# Patient Record
Sex: Female | Born: 1940 | Race: White | Hispanic: No | Marital: Married | Smoking: Former smoker
Health system: Southern US, Community
[De-identification: ages and names within clinical notes are randomized; demographics above are authoritative.]

## PROBLEM LIST (undated history)

## (undated) DIAGNOSIS — T50905A Adverse effect of unspecified drugs, medicaments and biological substances, initial encounter: Secondary | ICD-10-CM

## (undated) DIAGNOSIS — E039 Hypothyroidism, unspecified: Secondary | ICD-10-CM

## (undated) DIAGNOSIS — Z923 Personal history of irradiation: Secondary | ICD-10-CM

## (undated) DIAGNOSIS — Z9289 Personal history of other medical treatment: Secondary | ICD-10-CM

## (undated) DIAGNOSIS — N182 Chronic kidney disease, stage 2 (mild): Secondary | ICD-10-CM

## (undated) DIAGNOSIS — F419 Anxiety disorder, unspecified: Secondary | ICD-10-CM

## (undated) DIAGNOSIS — K5900 Constipation, unspecified: Secondary | ICD-10-CM

## (undated) DIAGNOSIS — M719 Bursopathy, unspecified: Secondary | ICD-10-CM

## (undated) DIAGNOSIS — D649 Anemia, unspecified: Secondary | ICD-10-CM

## (undated) DIAGNOSIS — I6529 Occlusion and stenosis of unspecified carotid artery: Secondary | ICD-10-CM

## (undated) DIAGNOSIS — M1990 Unspecified osteoarthritis, unspecified site: Secondary | ICD-10-CM

## (undated) DIAGNOSIS — K219 Gastro-esophageal reflux disease without esophagitis: Secondary | ICD-10-CM

## (undated) DIAGNOSIS — I38 Endocarditis, valve unspecified: Secondary | ICD-10-CM

## (undated) DIAGNOSIS — C50919 Malignant neoplasm of unspecified site of unspecified female breast: Secondary | ICD-10-CM

## (undated) DIAGNOSIS — I2510 Atherosclerotic heart disease of native coronary artery without angina pectoris: Secondary | ICD-10-CM

## (undated) DIAGNOSIS — E119 Type 2 diabetes mellitus without complications: Secondary | ICD-10-CM

## (undated) DIAGNOSIS — G4733 Obstructive sleep apnea (adult) (pediatric): Secondary | ICD-10-CM

## (undated) DIAGNOSIS — E785 Hyperlipidemia, unspecified: Secondary | ICD-10-CM

## (undated) DIAGNOSIS — R197 Diarrhea, unspecified: Secondary | ICD-10-CM

## (undated) DIAGNOSIS — J189 Pneumonia, unspecified organism: Secondary | ICD-10-CM

## (undated) DIAGNOSIS — F329 Major depressive disorder, single episode, unspecified: Secondary | ICD-10-CM

## (undated) DIAGNOSIS — A499 Bacterial infection, unspecified: Secondary | ICD-10-CM

## (undated) DIAGNOSIS — N390 Urinary tract infection, site not specified: Secondary | ICD-10-CM

## (undated) DIAGNOSIS — M359 Systemic involvement of connective tissue, unspecified: Secondary | ICD-10-CM

## (undated) DIAGNOSIS — I10 Essential (primary) hypertension: Secondary | ICD-10-CM

## (undated) DIAGNOSIS — F32A Depression, unspecified: Secondary | ICD-10-CM

## (undated) HISTORY — DX: Essential (primary) hypertension: I10

## (undated) HISTORY — DX: Atherosclerotic heart disease of native coronary artery without angina pectoris: I25.10

## (undated) HISTORY — DX: Major depressive disorder, single episode, unspecified: F32.9

## (undated) HISTORY — DX: Urinary tract infection, site not specified: A49.9

## (undated) HISTORY — DX: Constipation, unspecified: K59.00

## (undated) HISTORY — PX: INCISIONAL BREAST BIOPSY: SHX1812

## (undated) HISTORY — DX: Adverse effect of unspecified drugs, medicaments and biological substances, initial encounter: T50.905A

## (undated) HISTORY — DX: Hyperlipidemia, unspecified: E78.5

## (undated) HISTORY — DX: Unspecified osteoarthritis, unspecified site: M19.90

## (undated) HISTORY — PX: OTHER SURGICAL HISTORY: SHX169

## (undated) HISTORY — DX: Hypocalcemia: E83.51

## (undated) HISTORY — DX: Pneumonia, unspecified organism: J18.9

## (undated) HISTORY — PX: KNEE SURGERY: SHX244

## (undated) HISTORY — DX: Obstructive sleep apnea (adult) (pediatric): G47.33

## (undated) HISTORY — DX: Type 2 diabetes mellitus without complications: E11.9

## (undated) HISTORY — DX: Malignant neoplasm of unspecified site of unspecified female breast: C50.919

## (undated) HISTORY — DX: Anxiety disorder, unspecified: F41.9

## (undated) HISTORY — DX: Personal history of other medical treatment: Z92.89

## (undated) HISTORY — DX: Depression, unspecified: F32.A

## (undated) HISTORY — DX: Chronic kidney disease, stage 2 (mild): N18.2

## (undated) HISTORY — PX: PORT-A-CATH REMOVAL: SHX5289

## (undated) HISTORY — DX: Endocarditis, valve unspecified: I38

## (undated) HISTORY — DX: Anemia, unspecified: D64.9

## (undated) HISTORY — DX: Diarrhea, unspecified: R19.7

## (undated) HISTORY — DX: Urinary tract infection, site not specified: N39.0

## (undated) HISTORY — PX: CAROTID STENT: SHX1301

## (undated) HISTORY — DX: Occlusion and stenosis of unspecified carotid artery: I65.29

## (undated) HISTORY — DX: Hypothyroidism, unspecified: E03.9

## (undated) HISTORY — DX: Gastro-esophageal reflux disease without esophagitis: K21.9

---

## 1997-05-12 ENCOUNTER — Ambulatory Visit (HOSPITAL_COMMUNITY): Admission: RE | Admit: 1997-05-12 | Discharge: 1997-05-12 | Payer: Self-pay | Admitting: Family Medicine

## 1998-08-19 ENCOUNTER — Encounter: Payer: Self-pay | Admitting: Emergency Medicine

## 1998-08-20 ENCOUNTER — Encounter: Payer: Self-pay | Admitting: Family Medicine

## 1998-08-20 ENCOUNTER — Inpatient Hospital Stay (HOSPITAL_COMMUNITY): Admission: EM | Admit: 1998-08-20 | Discharge: 1998-09-01 | Payer: Self-pay | Admitting: Emergency Medicine

## 1998-08-23 ENCOUNTER — Encounter: Payer: Self-pay | Admitting: Family Medicine

## 1998-08-24 ENCOUNTER — Encounter: Payer: Self-pay | Admitting: Cardiothoracic Surgery

## 1998-08-25 ENCOUNTER — Encounter: Payer: Self-pay | Admitting: Cardiothoracic Surgery

## 1998-08-26 ENCOUNTER — Encounter: Payer: Self-pay | Admitting: Cardiothoracic Surgery

## 1998-08-27 ENCOUNTER — Encounter: Payer: Self-pay | Admitting: Cardiothoracic Surgery

## 1998-08-28 ENCOUNTER — Encounter: Payer: Self-pay | Admitting: Cardiothoracic Surgery

## 1998-08-29 ENCOUNTER — Encounter: Payer: Self-pay | Admitting: Surgery

## 1998-08-31 ENCOUNTER — Encounter: Payer: Self-pay | Admitting: Surgery

## 1998-09-02 ENCOUNTER — Encounter: Admission: RE | Admit: 1998-09-02 | Discharge: 1998-09-02 | Payer: Self-pay | Admitting: Family Medicine

## 1998-12-31 ENCOUNTER — Encounter: Payer: Self-pay | Admitting: Cardiothoracic Surgery

## 1998-12-31 ENCOUNTER — Encounter: Admission: RE | Admit: 1998-12-31 | Discharge: 1998-12-31 | Payer: Self-pay | Admitting: Cardiothoracic Surgery

## 2009-01-30 HISTORY — PX: CAROTID ENDARTERECTOMY: SUR193

## 2009-05-14 ENCOUNTER — Ambulatory Visit: Payer: Self-pay | Admitting: Vascular Surgery

## 2009-05-14 ENCOUNTER — Encounter: Payer: Self-pay | Admitting: Cardiology

## 2009-05-17 ENCOUNTER — Encounter: Payer: Self-pay | Admitting: Cardiology

## 2009-05-17 ENCOUNTER — Ambulatory Visit: Payer: Self-pay | Admitting: Surgery

## 2009-05-19 ENCOUNTER — Encounter: Admission: RE | Admit: 2009-05-19 | Discharge: 2009-05-19 | Payer: Self-pay | Admitting: Cardiology

## 2009-05-20 ENCOUNTER — Ambulatory Visit (HOSPITAL_COMMUNITY): Admission: RE | Admit: 2009-05-20 | Discharge: 2009-05-20 | Payer: Self-pay | Admitting: Cardiology

## 2009-05-28 ENCOUNTER — Encounter: Payer: Self-pay | Admitting: Surgery

## 2009-05-28 ENCOUNTER — Inpatient Hospital Stay (HOSPITAL_COMMUNITY): Admission: RE | Admit: 2009-05-28 | Discharge: 2009-05-30 | Payer: Self-pay | Admitting: Surgery

## 2009-05-28 ENCOUNTER — Ambulatory Visit: Payer: Self-pay | Admitting: Surgery

## 2009-05-30 DIAGNOSIS — C50919 Malignant neoplasm of unspecified site of unspecified female breast: Secondary | ICD-10-CM

## 2009-05-30 HISTORY — DX: Malignant neoplasm of unspecified site of unspecified female breast: C50.919

## 2009-06-01 ENCOUNTER — Telehealth: Payer: Self-pay | Admitting: Internal Medicine

## 2009-06-07 ENCOUNTER — Encounter: Admission: RE | Admit: 2009-06-07 | Discharge: 2009-06-07 | Payer: Self-pay | Admitting: Surgery

## 2009-06-07 ENCOUNTER — Ambulatory Visit: Payer: Self-pay | Admitting: Surgery

## 2009-06-07 ENCOUNTER — Encounter: Payer: Self-pay | Admitting: Internal Medicine

## 2009-06-09 ENCOUNTER — Telehealth: Payer: Self-pay | Admitting: Internal Medicine

## 2009-06-10 ENCOUNTER — Ambulatory Visit: Payer: Self-pay | Admitting: Diagnostic Radiology

## 2009-06-10 ENCOUNTER — Ambulatory Visit: Payer: Self-pay | Admitting: Internal Medicine

## 2009-06-10 ENCOUNTER — Ambulatory Visit (HOSPITAL_BASED_OUTPATIENT_CLINIC_OR_DEPARTMENT_OTHER): Admission: RE | Admit: 2009-06-10 | Discharge: 2009-06-10 | Payer: Self-pay | Admitting: Internal Medicine

## 2009-06-10 DIAGNOSIS — E039 Hypothyroidism, unspecified: Secondary | ICD-10-CM | POA: Insufficient documentation

## 2009-06-10 DIAGNOSIS — E782 Mixed hyperlipidemia: Secondary | ICD-10-CM

## 2009-06-10 DIAGNOSIS — K219 Gastro-esophageal reflux disease without esophagitis: Secondary | ICD-10-CM

## 2009-06-10 DIAGNOSIS — M1990 Unspecified osteoarthritis, unspecified site: Secondary | ICD-10-CM | POA: Insufficient documentation

## 2009-06-10 DIAGNOSIS — R930 Abnormal findings on diagnostic imaging of skull and head, not elsewhere classified: Secondary | ICD-10-CM | POA: Insufficient documentation

## 2009-06-10 DIAGNOSIS — R9389 Abnormal findings on diagnostic imaging of other specified body structures: Secondary | ICD-10-CM

## 2009-06-10 DIAGNOSIS — I10 Essential (primary) hypertension: Secondary | ICD-10-CM | POA: Insufficient documentation

## 2009-06-10 DIAGNOSIS — F329 Major depressive disorder, single episode, unspecified: Secondary | ICD-10-CM | POA: Insufficient documentation

## 2009-06-10 DIAGNOSIS — C50919 Malignant neoplasm of unspecified site of unspecified female breast: Secondary | ICD-10-CM | POA: Insufficient documentation

## 2009-06-10 LAB — CONVERTED CEMR LAB
Alpha 2, Urine: DETECTED % — AB
BUN: 38 mg/dL — ABNORMAL HIGH (ref 6–23)
Beta Globulin: 6.2 % (ref 4.7–7.2)
Beta, Urine: DETECTED % — AB
Chloride: 98 meq/L (ref 96–112)
Ferritin: 797 ng/mL — ABNORMAL HIGH (ref 10–291)
Free Kappa Lt Chains,Ur: 5.79 mg/dL — ABNORMAL HIGH (ref 0.04–1.51)
Free Lambda Lt Chains,Ur: 0.35 mg/dL (ref 0.08–1.01)
Gamma Globulin: 15.7 % (ref 11.1–18.8)
IgA: 262 mg/dL (ref 68–378)
IgG (Immunoglobin G), Serum: 1190 mg/dL (ref 694–1618)
Iron: 46 ug/dL (ref 42–145)
Potassium: 4.8 meq/L (ref 3.5–5.3)
Saturation Ratios: 19 % — ABNORMAL LOW (ref 20–55)
TIBC: 241 ug/dL — ABNORMAL LOW (ref 250–470)
TSH: 0.077 microintl units/mL — ABNORMAL LOW (ref 0.350–4.500)
UIBC: 195 ug/dL
Vitamin B-12: 195 pg/mL — ABNORMAL LOW (ref 211–911)

## 2009-06-11 ENCOUNTER — Encounter (INDEPENDENT_AMBULATORY_CARE_PROVIDER_SITE_OTHER): Payer: Self-pay | Admitting: *Deleted

## 2009-06-14 ENCOUNTER — Encounter: Admission: RE | Admit: 2009-06-14 | Discharge: 2009-06-14 | Payer: Self-pay | Admitting: Internal Medicine

## 2009-06-14 ENCOUNTER — Telehealth: Payer: Self-pay | Admitting: Internal Medicine

## 2009-06-14 ENCOUNTER — Encounter: Payer: Self-pay | Admitting: Internal Medicine

## 2009-06-17 ENCOUNTER — Encounter: Payer: Self-pay | Admitting: Internal Medicine

## 2009-06-17 ENCOUNTER — Encounter: Admission: RE | Admit: 2009-06-17 | Discharge: 2009-06-17 | Payer: Self-pay | Admitting: Internal Medicine

## 2009-06-17 LAB — HM MAMMOGRAPHY: HM Mammogram: ABNORMAL

## 2009-06-21 ENCOUNTER — Ambulatory Visit: Payer: Self-pay | Admitting: Surgery

## 2009-06-21 ENCOUNTER — Encounter: Payer: Self-pay | Admitting: Internal Medicine

## 2009-06-22 ENCOUNTER — Ambulatory Visit: Payer: Self-pay | Admitting: Oncology

## 2009-06-22 ENCOUNTER — Ambulatory Visit: Payer: Self-pay | Admitting: Internal Medicine

## 2009-06-22 DIAGNOSIS — C50919 Malignant neoplasm of unspecified site of unspecified female breast: Secondary | ICD-10-CM | POA: Insufficient documentation

## 2009-06-23 ENCOUNTER — Encounter: Payer: Self-pay | Admitting: Internal Medicine

## 2009-06-23 LAB — CBC WITH DIFFERENTIAL/PLATELET
Basophils Absolute: 0 10*3/uL (ref 0.0–0.1)
EOS%: 2.7 % (ref 0.0–7.0)
HGB: 8.6 g/dL — ABNORMAL LOW (ref 11.6–15.9)
MCH: 29.3 pg (ref 25.1–34.0)
MONO%: 6.7 % (ref 0.0–14.0)
NEUT#: 3.2 10*3/uL (ref 1.5–6.5)
RBC: 2.94 10*6/uL — ABNORMAL LOW (ref 3.70–5.45)
RDW: 16.7 % — ABNORMAL HIGH (ref 11.2–14.5)
lymph#: 3.2 10*3/uL (ref 0.9–3.3)

## 2009-06-23 LAB — COMPREHENSIVE METABOLIC PANEL
ALT: 8 U/L (ref 0–35)
AST: 14 U/L (ref 0–37)
Albumin: 4 g/dL (ref 3.5–5.2)
Alkaline Phosphatase: 198 U/L — ABNORMAL HIGH (ref 39–117)
BUN: 33 mg/dL — ABNORMAL HIGH (ref 6–23)
Calcium: 8.6 mg/dL (ref 8.4–10.5)
Chloride: 103 mEq/L (ref 96–112)
Potassium: 4.8 mEq/L (ref 3.5–5.3)
Sodium: 137 mEq/L (ref 135–145)
Total Protein: 6.5 g/dL (ref 6.0–8.3)

## 2009-06-24 ENCOUNTER — Encounter: Admission: RE | Admit: 2009-06-24 | Discharge: 2009-06-24 | Payer: Self-pay | Admitting: Internal Medicine

## 2009-06-25 ENCOUNTER — Ambulatory Visit (HOSPITAL_COMMUNITY): Admission: RE | Admit: 2009-06-25 | Discharge: 2009-06-25 | Payer: Self-pay | Admitting: Internal Medicine

## 2009-06-29 ENCOUNTER — Encounter: Admission: RE | Admit: 2009-06-29 | Discharge: 2009-06-29 | Payer: Self-pay | Admitting: Internal Medicine

## 2009-06-30 ENCOUNTER — Telehealth: Payer: Self-pay | Admitting: Internal Medicine

## 2009-07-01 ENCOUNTER — Ambulatory Visit (HOSPITAL_COMMUNITY): Admission: RE | Admit: 2009-07-01 | Discharge: 2009-07-01 | Payer: Self-pay | Admitting: Oncology

## 2009-07-01 ENCOUNTER — Encounter: Payer: Self-pay | Admitting: Internal Medicine

## 2009-07-06 ENCOUNTER — Ambulatory Visit (HOSPITAL_COMMUNITY): Admission: RE | Admit: 2009-07-06 | Discharge: 2009-07-06 | Payer: Self-pay | Admitting: Oncology

## 2009-07-16 ENCOUNTER — Telehealth: Payer: Self-pay | Admitting: Internal Medicine

## 2009-07-19 ENCOUNTER — Encounter: Payer: Self-pay | Admitting: Internal Medicine

## 2009-07-22 ENCOUNTER — Ambulatory Visit (HOSPITAL_COMMUNITY): Admission: RE | Admit: 2009-07-22 | Discharge: 2009-07-22 | Payer: Self-pay | Admitting: General Surgery

## 2009-07-26 ENCOUNTER — Ambulatory Visit: Payer: Self-pay | Admitting: Oncology

## 2009-07-27 LAB — CBC & DIFF AND RETIC
BASO%: 0.4 % (ref 0.0–2.0)
Basophils Absolute: 0 10*3/uL (ref 0.0–0.1)
HCT: 25.8 % — ABNORMAL LOW (ref 34.8–46.6)
HGB: 7.7 g/dL — ABNORMAL LOW (ref 11.6–15.9)
Immature Retic Fract: 12.5 % — ABNORMAL HIGH (ref 0.00–10.70)
MONO#: 0.6 10*3/uL (ref 0.1–0.9)
NEUT%: 45.9 % (ref 38.4–76.8)
RDW: 19.6 % — ABNORMAL HIGH (ref 11.2–14.5)
Retic %: 3.08 % — ABNORMAL HIGH (ref 0.50–1.50)
Retic Ct Abs: 83.78 10*3/uL — ABNORMAL HIGH (ref 18.30–72.70)
WBC: 6.9 10*3/uL (ref 3.9–10.3)
lymph#: 2.9 10*3/uL (ref 0.9–3.3)

## 2009-07-28 LAB — COMPREHENSIVE METABOLIC PANEL
ALT: 9 U/L (ref 0–35)
AST: 10 U/L (ref 0–37)
Alkaline Phosphatase: 229 U/L — ABNORMAL HIGH (ref 39–117)
CO2: 19 mEq/L (ref 19–32)
Sodium: 138 mEq/L (ref 135–145)
Total Bilirubin: 0.3 mg/dL (ref 0.3–1.2)
Total Protein: 6.1 g/dL (ref 6.0–8.3)

## 2009-07-28 LAB — VITAMIN D 25 HYDROXY (VIT D DEFICIENCY, FRACTURES): Vit D, 25-Hydroxy: 11 ng/mL — ABNORMAL LOW (ref 30–89)

## 2009-08-06 ENCOUNTER — Encounter: Payer: Self-pay | Admitting: Internal Medicine

## 2009-08-16 ENCOUNTER — Encounter: Payer: Self-pay | Admitting: Internal Medicine

## 2009-08-16 LAB — CONVERTED CEMR LAB
BUN: 28 mg/dL — ABNORMAL HIGH (ref 6–23)
Chloride: 109 meq/L (ref 96–112)
Creatinine, Ser: 0.95 mg/dL (ref 0.40–1.20)
Hgb A1c MFr Bld: 5.9 % — ABNORMAL HIGH (ref <5.7)
Potassium: 5.8 meq/L — ABNORMAL HIGH (ref 3.5–5.3)
TSH: 0.072 microintl units/mL — ABNORMAL LOW (ref 0.350–4.500)

## 2009-08-17 ENCOUNTER — Telehealth: Payer: Self-pay | Admitting: Internal Medicine

## 2009-08-17 ENCOUNTER — Encounter: Admission: RE | Admit: 2009-08-17 | Discharge: 2009-08-17 | Payer: Self-pay | Admitting: Oncology

## 2009-08-19 ENCOUNTER — Encounter: Payer: Self-pay | Admitting: Internal Medicine

## 2009-08-23 ENCOUNTER — Ambulatory Visit: Payer: Self-pay | Admitting: Internal Medicine

## 2009-08-23 DIAGNOSIS — E875 Hyperkalemia: Secondary | ICD-10-CM | POA: Insufficient documentation

## 2009-08-23 LAB — CONVERTED CEMR LAB
Calcium: 9.5 mg/dL (ref 8.4–10.5)
Creatinine, Ser: 0.87 mg/dL (ref 0.40–1.20)
Sodium: 139 meq/L (ref 135–145)

## 2009-08-24 ENCOUNTER — Telehealth: Payer: Self-pay | Admitting: Internal Medicine

## 2009-08-24 ENCOUNTER — Encounter: Payer: Self-pay | Admitting: Internal Medicine

## 2009-08-24 LAB — CBC WITH DIFFERENTIAL/PLATELET
BASO%: 0.5 % (ref 0.0–2.0)
EOS%: 3.2 % (ref 0.0–7.0)
LYMPH%: 41.7 % (ref 14.0–49.7)
MCHC: 30.6 g/dL — ABNORMAL LOW (ref 31.5–36.0)
MONO#: 0.4 10*3/uL (ref 0.1–0.9)
MONO%: 6.8 % (ref 0.0–14.0)
Platelets: 272 10*3/uL (ref 145–400)
RBC: 2.87 10*6/uL — ABNORMAL LOW (ref 3.70–5.45)
WBC: 6.3 10*3/uL (ref 3.9–10.3)

## 2009-08-24 LAB — COMPREHENSIVE METABOLIC PANEL
Albumin: 3.9 g/dL (ref 3.5–5.2)
Alkaline Phosphatase: 196 U/L — ABNORMAL HIGH (ref 39–117)
CO2: 18 mEq/L — ABNORMAL LOW (ref 19–32)
Glucose, Bld: 80 mg/dL (ref 70–99)
Potassium: 6 mEq/L — ABNORMAL HIGH (ref 3.5–5.3)
Sodium: 138 mEq/L (ref 135–145)
Total Protein: 6.4 g/dL (ref 6.0–8.3)

## 2009-09-13 ENCOUNTER — Telehealth: Payer: Self-pay | Admitting: Internal Medicine

## 2009-09-17 ENCOUNTER — Ambulatory Visit: Payer: Self-pay | Admitting: Oncology

## 2009-09-21 LAB — COMPREHENSIVE METABOLIC PANEL
Albumin: 4 g/dL (ref 3.5–5.2)
Alkaline Phosphatase: 141 U/L — ABNORMAL HIGH (ref 39–117)
BUN: 24 mg/dL — ABNORMAL HIGH (ref 6–23)
Creatinine, Ser: 0.85 mg/dL (ref 0.40–1.20)
Glucose, Bld: 96 mg/dL (ref 70–99)
Total Bilirubin: 0.3 mg/dL (ref 0.3–1.2)

## 2009-09-21 LAB — CBC WITH DIFFERENTIAL/PLATELET
Basophils Absolute: 0 10*3/uL (ref 0.0–0.1)
Eosinophils Absolute: 0.3 10*3/uL (ref 0.0–0.5)
HGB: 9.5 g/dL — ABNORMAL LOW (ref 11.6–15.9)
LYMPH%: 35.1 % (ref 14.0–49.7)
MCV: 94.8 fL (ref 79.5–101.0)
MONO%: 10 % (ref 0.0–14.0)
NEUT#: 3.8 10*3/uL (ref 1.5–6.5)
NEUT%: 50.9 % (ref 38.4–76.8)
Platelets: 275 10*3/uL (ref 145–400)

## 2009-09-29 ENCOUNTER — Encounter: Payer: Self-pay | Admitting: Internal Medicine

## 2009-10-19 ENCOUNTER — Encounter: Payer: Self-pay | Admitting: Internal Medicine

## 2009-10-19 ENCOUNTER — Ambulatory Visit: Payer: Self-pay | Admitting: Oncology

## 2009-10-19 LAB — CBC WITH DIFFERENTIAL/PLATELET
BASO%: 0.4 % (ref 0.0–2.0)
Basophils Absolute: 0 10*3/uL (ref 0.0–0.1)
EOS%: 4.1 % (ref 0.0–7.0)
HGB: 10.5 g/dL — ABNORMAL LOW (ref 11.6–15.9)
MCH: 28.5 pg (ref 25.1–34.0)
MCHC: 30.6 g/dL — ABNORMAL LOW (ref 31.5–36.0)
MCV: 93 fL (ref 79.5–101.0)
MONO%: 7.1 % (ref 0.0–14.0)
RDW: 14.2 % (ref 11.2–14.5)

## 2009-10-19 LAB — COMPREHENSIVE METABOLIC PANEL
AST: 14 U/L (ref 0–37)
Alkaline Phosphatase: 104 U/L (ref 39–117)
BUN: 25 mg/dL — ABNORMAL HIGH (ref 6–23)
Creatinine, Ser: 1.03 mg/dL (ref 0.40–1.20)
Potassium: 5.6 mEq/L — ABNORMAL HIGH (ref 3.5–5.3)

## 2009-11-01 ENCOUNTER — Telehealth: Payer: Self-pay | Admitting: Internal Medicine

## 2009-11-16 LAB — COMPREHENSIVE METABOLIC PANEL
AST: 13 U/L (ref 0–37)
Albumin: 4 g/dL (ref 3.5–5.2)
BUN: 26 mg/dL — ABNORMAL HIGH (ref 6–23)
CO2: 25 mEq/L (ref 19–32)
Calcium: 9.1 mg/dL (ref 8.4–10.5)
Chloride: 107 mEq/L (ref 96–112)
Glucose, Bld: 103 mg/dL — ABNORMAL HIGH (ref 70–99)
Potassium: 5.6 mEq/L — ABNORMAL HIGH (ref 3.5–5.3)

## 2009-11-16 LAB — CBC WITH DIFFERENTIAL/PLATELET
Basophils Absolute: 0 10*3/uL (ref 0.0–0.1)
EOS%: 4 % (ref 0.0–7.0)
Eosinophils Absolute: 0.3 10*3/uL (ref 0.0–0.5)
HCT: 31.9 % — ABNORMAL LOW (ref 34.8–46.6)
HGB: 10.8 g/dL — ABNORMAL LOW (ref 11.6–15.9)
MONO#: 0.5 10*3/uL (ref 0.1–0.9)
NEUT#: 3.8 10*3/uL (ref 1.5–6.5)
NEUT%: 55.8 % (ref 38.4–76.8)
RDW: 15.5 % — ABNORMAL HIGH (ref 11.2–14.5)
lymph#: 2.2 10*3/uL (ref 0.9–3.3)

## 2009-11-16 LAB — CANCER ANTIGEN 27.29: CA 27.29: 58 U/mL — ABNORMAL HIGH (ref 0–39)

## 2009-12-08 ENCOUNTER — Ambulatory Visit (HOSPITAL_COMMUNITY): Admission: RE | Admit: 2009-12-08 | Discharge: 2009-12-08 | Payer: Self-pay | Admitting: Family Medicine

## 2009-12-11 ENCOUNTER — Encounter: Admission: RE | Admit: 2009-12-11 | Discharge: 2009-12-11 | Payer: Self-pay | Admitting: Oncology

## 2009-12-13 ENCOUNTER — Ambulatory Visit: Payer: Self-pay | Admitting: Oncology

## 2009-12-15 ENCOUNTER — Encounter: Payer: Self-pay | Admitting: Internal Medicine

## 2009-12-15 LAB — COMPREHENSIVE METABOLIC PANEL
Albumin: 3.8 g/dL (ref 3.5–5.2)
CO2: 23 mEq/L (ref 19–32)
Glucose, Bld: 91 mg/dL (ref 70–99)
Potassium: 4.8 mEq/L (ref 3.5–5.3)
Sodium: 139 mEq/L (ref 135–145)
Total Protein: 7.1 g/dL (ref 6.0–8.3)

## 2009-12-15 LAB — CBC WITH DIFFERENTIAL/PLATELET
Eosinophils Absolute: 0.2 10*3/uL (ref 0.0–0.5)
LYMPH%: 38.1 % (ref 14.0–49.7)
MONO#: 0.6 10*3/uL (ref 0.1–0.9)
NEUT#: 3.4 10*3/uL (ref 1.5–6.5)
Platelets: 255 10*3/uL (ref 145–400)
RBC: 3.53 10*6/uL — ABNORMAL LOW (ref 3.70–5.45)
RDW: 15.8 % — ABNORMAL HIGH (ref 11.2–14.5)
WBC: 6.9 10*3/uL (ref 3.9–10.3)

## 2009-12-15 LAB — CANCER ANTIGEN 27.29: CA 27.29: 63 U/mL — ABNORMAL HIGH (ref 0–39)

## 2010-01-11 LAB — CBC WITH DIFFERENTIAL/PLATELET
BASO%: 0.7 % (ref 0.0–2.0)
EOS%: 3.8 % (ref 0.0–7.0)
HCT: 32.7 % — ABNORMAL LOW (ref 34.8–46.6)
LYMPH%: 37.5 % (ref 14.0–49.7)
MCH: 29.2 pg (ref 25.1–34.0)
MCHC: 32.1 g/dL (ref 31.5–36.0)
MCV: 90.8 fL (ref 79.5–101.0)
MONO#: 0.5 10*3/uL (ref 0.1–0.9)
MONO%: 6.1 % (ref 0.0–14.0)
NEUT%: 51.9 % (ref 38.4–76.8)
Platelets: 240 10*3/uL (ref 145–400)
RBC: 3.6 10*6/uL — ABNORMAL LOW (ref 3.70–5.45)
WBC: 7.7 10*3/uL (ref 3.9–10.3)
nRBC: 0 % (ref 0–0)

## 2010-01-11 LAB — COMPREHENSIVE METABOLIC PANEL
ALT: 22 U/L (ref 0–35)
AST: 18 U/L (ref 0–37)
BUN: 26 mg/dL — ABNORMAL HIGH (ref 6–23)
Calcium: 8.8 mg/dL (ref 8.4–10.5)
Chloride: 106 mEq/L (ref 96–112)
Creatinine, Ser: 1.11 mg/dL (ref 0.40–1.20)
Total Bilirubin: 0.3 mg/dL (ref 0.3–1.2)

## 2010-02-04 ENCOUNTER — Ambulatory Visit: Payer: Self-pay | Admitting: Oncology

## 2010-02-04 ENCOUNTER — Telehealth: Payer: Self-pay | Admitting: Internal Medicine

## 2010-02-07 ENCOUNTER — Telehealth: Payer: Self-pay | Admitting: Internal Medicine

## 2010-02-07 ENCOUNTER — Encounter: Payer: Self-pay | Admitting: Internal Medicine

## 2010-02-08 ENCOUNTER — Encounter: Payer: Self-pay | Admitting: Internal Medicine

## 2010-02-08 ENCOUNTER — Telehealth: Payer: Self-pay | Admitting: Internal Medicine

## 2010-02-08 LAB — COMPREHENSIVE METABOLIC PANEL WITH GFR
ALT: 11 U/L (ref 0–35)
AST: 10 U/L (ref 0–37)
Albumin: 4.2 g/dL (ref 3.5–5.2)
Alkaline Phosphatase: 79 U/L (ref 39–117)
BUN: 46 mg/dL — ABNORMAL HIGH (ref 6–23)
CO2: 21 meq/L (ref 19–32)
Calcium: 8.8 mg/dL (ref 8.4–10.5)
Chloride: 104 meq/L (ref 96–112)
Creatinine, Ser: 1.22 mg/dL — ABNORMAL HIGH (ref 0.40–1.20)
Glucose, Bld: 107 mg/dL — ABNORMAL HIGH (ref 70–99)
Potassium: 5 meq/L (ref 3.5–5.3)
Sodium: 136 meq/L (ref 135–145)
Total Bilirubin: 0.3 mg/dL (ref 0.3–1.2)
Total Protein: 6.9 g/dL (ref 6.0–8.3)

## 2010-02-08 LAB — CBC WITH DIFFERENTIAL/PLATELET
BASO%: 0.6 % (ref 0.0–2.0)
Basophils Absolute: 0 10e3/uL (ref 0.0–0.1)
EOS%: 4.7 % (ref 0.0–7.0)
Eosinophils Absolute: 0.3 10e3/uL (ref 0.0–0.5)
HCT: 31.3 % — ABNORMAL LOW (ref 34.8–46.6)
HGB: 10.1 g/dL — ABNORMAL LOW (ref 11.6–15.9)
LYMPH%: 34.9 % (ref 14.0–49.7)
MCH: 29.7 pg (ref 25.1–34.0)
MCHC: 32.3 g/dL (ref 31.5–36.0)
MCV: 92.1 fL (ref 79.5–101.0)
MONO#: 0.5 10e3/uL (ref 0.1–0.9)
MONO%: 7.5 % (ref 0.0–14.0)
NEUT#: 3.7 10e3/uL (ref 1.5–6.5)
NEUT%: 52.3 % (ref 38.4–76.8)
Platelets: 244 10e3/uL (ref 145–400)
RBC: 3.4 10e6/uL — ABNORMAL LOW (ref 3.70–5.45)
RDW: 14.7 % — ABNORMAL HIGH (ref 11.2–14.5)
WBC: 7.1 10e3/uL (ref 3.9–10.3)
lymph#: 2.5 10e3/uL (ref 0.9–3.3)
nRBC: 0 % (ref 0–0)

## 2010-02-08 LAB — CANCER ANTIGEN 27.29: CA 27.29: 78 U/mL — ABNORMAL HIGH (ref 0–39)

## 2010-02-14 ENCOUNTER — Ambulatory Visit: Admission: RE | Admit: 2010-02-14 | Payer: Self-pay | Source: Home / Self Care | Admitting: Surgery

## 2010-02-14 ENCOUNTER — Ambulatory Visit
Admission: RE | Admit: 2010-02-14 | Discharge: 2010-02-14 | Payer: Self-pay | Source: Home / Self Care | Attending: Surgery | Admitting: Surgery

## 2010-02-15 ENCOUNTER — Ambulatory Visit
Admission: RE | Admit: 2010-02-15 | Discharge: 2010-02-15 | Payer: Self-pay | Source: Home / Self Care | Attending: Internal Medicine | Admitting: Internal Medicine

## 2010-02-15 DIAGNOSIS — I6529 Occlusion and stenosis of unspecified carotid artery: Secondary | ICD-10-CM | POA: Insufficient documentation

## 2010-02-15 LAB — CONVERTED CEMR LAB
Chloride: 105 meq/L (ref 96–112)
Hgb A1c MFr Bld: 6.5 % — ABNORMAL HIGH (ref <5.7)
Potassium: 5 meq/L (ref 3.5–5.3)
Sodium: 139 meq/L (ref 135–145)
TSH: 3.372 microintl units/mL (ref 0.350–4.500)

## 2010-02-17 ENCOUNTER — Encounter: Payer: Self-pay | Admitting: Internal Medicine

## 2010-02-20 ENCOUNTER — Encounter: Payer: Self-pay | Admitting: Oncology

## 2010-02-22 NOTE — Procedures (Unsigned)
CAROTID DUPLEX EXAM  INDICATION:  Followup known carotid disease.  HISTORY: Diabetes:  Yes. Cardiac:  No. Hypertension:  Yes. Smoking:  Previous. Previous Surgery:  Right CEA April 2011. CV History: Amaurosis Fugax No, Paresthesias No, Hemiparesis No                                      RIGHT                    LEFT Brachial systolic pressure:         160                      152 Brachial Doppler waveforms:         Mono/biphasic            Within normal limits Vertebral direction of flow:        To fro                   Antegrade DUPLEX VELOCITIES (cm/sec) CCA peak systolic                   131                      93 ECA peak systolic                   186                      108 ICA peak systolic                   94                       370 ICA end diastolic                   23                       104 PLAQUE MORPHOLOGY:                  Heterogeneous            Calcific PLAQUE AMOUNT:                      Moderate                 Severe PLAQUE LOCATION:                    Innominate/subclavian/ECA                  ICA/ECA  IMPRESSION: 1. Widely patent right carotid endarterectomy. 2. Significant stenosis of right subclavian artery, possibly     innominate artery, with to fro flow in the right vertebral artery. 3. Technically difficult interrogation of left internal carotid     artery.  Reported velocities are likely inaccurate due to     calcification, suspect 80% to 99% stenosis as previously reported. 4. Left vertebral artery is within normal limits. 5. Incidentally noted there is elevated flow in the left jugular vein. 6. Results were communicated with Dr. Myra Gianotti.  ___________________________________________ V. Charlena Cross, MD  LT/MEDQ  D:  02/14/2010  T:  02/14/2010  Job:  [id number]

## 2010-02-23 ENCOUNTER — Telehealth: Payer: Self-pay | Admitting: Internal Medicine

## 2010-03-01 LAB — COMPREHENSIVE METABOLIC PANEL
ALT: 14 U/L (ref 0–35)
Alkaline Phosphatase: 79 U/L (ref 39–117)
BUN: 29 mg/dL — ABNORMAL HIGH (ref 6–23)
CO2: 24 mEq/L (ref 19–32)
Calcium: 9.1 mg/dL (ref 8.4–10.5)
GFR calc non Af Amer: 36 mL/min — ABNORMAL LOW (ref >60)
Glucose, Bld: 128 mg/dL — ABNORMAL HIGH (ref 70–99)
Sodium: 140 mEq/L (ref 135–145)
Total Protein: 6.6 g/dL (ref 6.0–8.3)

## 2010-03-01 LAB — SURGICAL PCR SCREEN
MRSA, PCR: NEGATIVE
Staphylococcus aureus: NEGATIVE

## 2010-03-01 LAB — DIFFERENTIAL
Basophils Absolute: 0 10*3/uL (ref 0.0–0.1)
Lymphocytes Relative: 30 % (ref 12–46)
Lymphs Abs: 2.7 10*3/uL (ref 0.7–4.0)
Neutro Abs: 5.4 10*3/uL (ref 1.7–7.7)

## 2010-03-01 LAB — CBC
MCV: 93.2 fL (ref 78.0–100.0)
Platelets: 253 10*3/uL (ref 150–400)
RBC: 3.37 MIL/uL — ABNORMAL LOW (ref 3.87–5.11)
RDW: 13.8 % (ref 11.5–15.5)
WBC: 9 10*3/uL (ref 4.0–10.5)

## 2010-03-01 NOTE — Progress Notes (Signed)
Summary: Bone Scan Results  Phone Note Call from Patient Call back at Home Phone [phone]   Caller: Patient Call For: [name] DO Summary of Call: patient called and left voice message stating Dr Darnelle Catalan is requesting a copy of her Bone Scan, and if we could fax the results to their office.  Call was returned to patient she states Dr Darnelle Catalan was going to perform a bone scan, and she had informed him that Dr Artist Pais had one done already, which is why she was requsting the results. Patient was informed the results were faxed to Dr Princella Pellegrini office. She states she has a follow up next week with Dr Darnelle Catalan and  inquired about the results. I informed patient the test results from the bone scan would be discussed with her by Dr Darnelle Catalan or Dr Artist Pais. Initial call taken by: [name] CMA,  June 30, 2009 1:47 PM  Follow-up for Phone Call        I discussed bone scan results with pt.  she will further discuss with Dr. Aaron Edelman on Wed Follow-up by: [name] DO,  July 01, 2009 5:39 PM

## 2010-03-01 NOTE — Letter (Signed)
Summary: [facility]  [facility]   Imported By: Lanelle Bal 11/08/2009 11:27:57  _____________________________________________________________________  External Attachment:    Type:   Image     Comment:   External Document

## 2010-03-01 NOTE — Progress Notes (Signed)
Summary: Lab Results  Phone Note Outgoing Call   Summary of Call: call pt - potassium as well as other electrolytes are normal Initial call taken by: [name] DO,  August 24, 2009 9:46 AM  Follow-up for Phone Call        call placed to patient at [phone] no answer, a detailed voice message was left informing patient per Dr Artist Pais instructions. Message left for patient to call if any questions Follow-up by: [name] CMA,  August 24, 2009 10:30 AM

## 2010-03-01 NOTE — Letter (Signed)
[facility]   Imported By: Lanelle Bal 09/10/2009 11:59:40  _____________________________________________________________________  External Attachment:    Type:   Image     Comment:   External Document

## 2010-03-01 NOTE — Progress Notes (Signed)
Summary: Lab Results  Phone Note Outgoing Call   Summary of Call: call pt - blood test shows she needs lower dose of thyroid medication.  see rx needs repeat TSH in 2 months.  244.9  potassium is elevated.   make sure pt not taking any NSAIDs.  avoid high K foods.  we can repeat electrolytes at next OV  Initial call taken by: [name] DO,  August 17, 2009 1:53 PM  Follow-up for Phone Call        attempted to contact patient at [phone], no answer. A detailed voice message was left informing patient per Dr Artist Pais instructions. Message left for patient to call if any questions Follow-up by: [name] CMA,  August 17, 2009 2:05 PM    New/Updated Medications: LEVOTHYROXINE SODIUM 175 MCG TABS (LEVOTHYROXINE SODIUM) one by mouth once daily Prescriptions: LEVOTHYROXINE SODIUM 175 MCG TABS (LEVOTHYROXINE SODIUM) one by mouth once daily  #30 x 2   Entered and Authorized by:   [name] DO   Signed by:   [name] DO on 08/17/2009   Method used:   Electronically to        [facility]  [address] (retail)       [address]       [place], [place]  [address]       Ph: [phone]       Fax: [phone]   RxID:   [id number]

## 2010-03-01 NOTE — Miscellaneous (Signed)
Summary: Lab Orders  Clinical Lists Changes  Orders: Added new Test order of T-Basic Metabolic Panel [id number]) - Signed Added new Test order of T-TSH [phone]) - Signed Added new Test order of T- Hemoglobin A1C ([id number]) - Signed

## 2010-03-01 NOTE — Letter (Signed)
Summary: [facility]  [facility]   Imported By: Lanelle Bal 07/01/2009 14:17:30  _____________________________________________________________________  External Attachment:    Type:   Image     Comment:   External Document

## 2010-03-01 NOTE — Assessment & Plan Note (Signed)
Summary: 2 week follow up/mhf   Vital Signs:  Patient profile:   70 year old female Height:      62 inches Weight:      207.25 pounds BMI:     38.04 O2 Sat:      98 % on Room air Temp:     98.0 degrees F oral Pulse rate:   77 / minute Pulse rhythm:   regular Resp:     18 per minute BP sitting:   134 / 58  (right arm) Cuff size:   large  Vitals Entered By: [name] CMA (Jun 22, 2009 3:19 PM)  O2 Flow:  Room air CC: Rm 3- 2 Week Follow up   Primary Care Provider:  DThomos Lemons DO  CC:  Rm 3- 2 Week Follow up.  History of Present Illness: 70 y/o white female for f/u int hx: right breast cancer confirmed with biopsy,  breast MRI has been scheduled rx to help with relaxing for MRI scheduled for Thursday bone scan scheduled  Carotid artery stenosis - left carotid surgery can be deferred  DM II - AM blood sugars in 100-120's  SPEP - negative  Allergies: 1)  ! Codeine 2)  ! 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   Lungs:  normal respiratory effort, normal breath sounds, no crackles, and no wheezes.   Heart:  normal rate, regular rhythm, no murmur, and no gallop.   Extremities:  trace left pedal edema and trace right pedal edema.     Impression & Recommendations:  Problem # 1:  NEOPLASM, MALIGNANT, RIGHT BREAST (ICD-174.9) Breast US showed - irregularly marginated hypoechoic solid mass with posterior  shadowing measuring at least 1.7 x 1.3 x 1.8 cm. pathology showed invasive ductal carcinoma.   awaiting MRI of breast and bone scan.  alprazolam before MRI tx as per oncology SPEP is negative.  bone lesions likely metastatic dz.  Problem # 2:  DIABETES MELLITUS, TYPE II (ICD-250.00) no hypoglycemia.   Maintain current medication regimen.  Her updated medication list for this problem includes:    Metformin Hcl 1000 Mg Tabs (Metformin hcl) .Marland Kitchen... Take 1/2  tablet by mouth two times a day    Lantus 100 Unit/ml Soln (Insulin glargine) ..... Inject 40 units subcutaneously once daily    Benicar Hct 40-12.5 Mg Tabs (Olmesartan medoxomil-hctz) .Marland Kitchen... Take 1 tablet by mouth once a day    Aspirin Ec 81 Mg Tbec (Aspirin) .Marland Kitchen... Take 1 tablet by mouth once a day  Labs Reviewed: Creat: 1.22 (06/10/2009)     Problem # 3:  HYPERTENSION (ICD-401.9) stable.  Maintain current medication regimen.  Her updated medication list for this problem includes:    Benicar Hct 40-12.5 Mg Tabs (Olmesartan medoxomil-hctz) .Marland Kitchen... Take 1 tablet by mouth once a day  BP today: 134/58 Prior BP: 122/68 (06/10/2009)  Labs Reviewed: K+: 4.8 (06/10/2009) Creat: : 1.22 (06/10/2009)     Complete Medication List: 1)  Omeprazole 20 Mg Cpdr (Omeprazole) .Marland KitchenMarland KitchenMarland Kitchen  Take 1 capsule by mouth once a day 2)  Metformin Hcl 1000 Mg Tabs (Metformin hcl) .... Take 1/2  tablet by mouth two times a day 3)  Alprazolam 0.5 Mg Tabs (Alprazolam) .... One by mouth once daily as needed 4)  Simvastatin 40 Mg Tabs (Simvastatin) .... One by mouth q pm 5)  Levothyroxine Sodium 137 Mcg Tabs (Levothyroxine sodium) .Marland Kitchen.. 1 1/2 tablet by mouth once daily 6)  Lantus 100 Unit/ml Soln (Insulin glargine) .... Inject 40 units subcutaneously once daily 7)  Garlic Oil 1000 Mg Caps (Garlic) .... Take 1 capsule by mouth once a day 8)  Fish Oil 1200 Mg Caps (Omega-3 fatty acids) .... Take 1 capsule by mouth once a day 9)  Benicar Hct 40-12.5 Mg Tabs (Olmesartan  medoxomil-hctz) .... Take 1 tablet by mouth once a day 10)  Aspirin Ec 81 Mg Tbec (Aspirin) .... Take 1 tablet by mouth once a day 11)  Hydrocodone-acetaminophen 5-500 Mg Tabs (Hydrocodone-acetaminophen) .... One tablet by mouth every 4 hours as needed  Patient Instructions: 1)  Please schedule a follow-up appointment in 2 months. 2)  BMP prior to visit, ICD-9:  401.9 3)  HbgA1C prior to visit, ICD-9:  250.00 4)  TSH:  244.9 5)  Please return for lab work one (1) week before your next appointment.  Prescriptions: BENICAR HCT 40-12.5 MG TABS (OLMESARTAN MEDOXOMIL-HCTZ) Take 1 tablet by mouth once a day  #90 x 1   Entered and Authorized by:   [name] DO   Signed by:   [name] DO on 06/22/2009   Method used:   Electronically to        [facility]  [address] (retail)       [address]       [place], [place]  [address]       Ph: [phone]       Fax: [phone]   RxID:   [id number] METFORMIN HCL 1000 MG TABS (METFORMIN HCL) Take 1/2  tablet by mouth two times a day  #30 x 5   Entered and Authorized by:   [name] DO   Signed by:   [name] DO on 06/22/2009   Method used:   Historical   RxID:   [id number] SIMVASTATIN 40 MG TABS (SIMVASTATIN) one by mouth q pm  #30 x 5   Entered and Authorized by:   [name] DO   Signed by:   [name] DO on 06/22/2009   Method used:   Electronically to        [facility]  [address] (retail)       [address]       [place], [place]  [address]       Ph: [phone]       Fax: [phone]   RxID:   [id number] LANTUS 100 UNIT/ML SOLN (INSULIN GLARGINE) inject 40 units subcutaneously once daily  #1 month x 5   Entered and Authorized by:   [name] DO   Signed by:   [name] DO on 06/22/2009   Method used:   Electronically to        [facility]  [address] (retail)       [address]       [place], [place]  [address]  Ph: [phone]        Fax: [phone]   RxID:   [id number] ALPRAZOLAM 0.5 MG TABS (ALPRAZOLAM) one by mouth once daily as needed  #30 x 1   Entered and Authorized by:   [name] DO   Signed by:   [name] DO on 06/22/2009   Method used:   Print then Give to Patient   RxID:   [id number]   Current Allergies (reviewed today): ! CODEINE ! TYLOX

## 2010-03-01 NOTE — Progress Notes (Signed)
Summary: Pt's records  Phone Note Call from Patient Call back at Home Phone [phone]   Caller: Spouse Summary of Call: Pt is requesting [place] to get all of her records from [facility] prior to her 06/14/09 appt with Dr Artist Pais, pt is unable to travel right now due to recent neck surgery. Pt's husband wants to know if we can mail her the HIPAA release forms in the mail & he will have her sign and mail back to [place]. Initial call taken by: Lannette Donath,  Jun 01, 2009 2:52 PM  Follow-up for Phone Call        Called patient husband.  He will go by [facility] and get the form and have his wife sign and then go back and request the records from them himself.  The patient will be here for her appt with Dr Artist Pais on the 16th.  Her husband will bring her. Roselle Locus  Jun 02, 2009 8:19 AM

## 2010-03-01 NOTE — Letter (Signed)
Summary: [facility]N for Diabetes Supplies/[facility]  [facility] for Diabetes Supplies/[facility]   Imported By: Lanelle Bal 07/28/2009 08:53:58  _____________________________________________________________________  External Attachment:    Type:   Image     Comment:   External Document

## 2010-03-01 NOTE — Letter (Signed)
Summary: [facility]  [facility]   Imported By: Lanelle Bal 07/12/2009 12:15:53  _____________________________________________________________________  External Attachment:    Type:   Image     Comment:   External Document

## 2010-03-01 NOTE — Progress Notes (Signed)
Summary: Generic Alternative Benicar HCT  Phone Note Call from Patient Call back at Home Phone [phone]   Caller: Patient Call For: [name] DO Summary of Call: patient called and left voice message stating she is in the donut hole and is unable to afford the cost of the Benicar-HCT. She would like to know if there is a generic that she could take in place of the brand. Initial call taken by: [name] CMA,  November 01, 2009 4:42 PM  Follow-up for Phone Call        see rx for losartan / hctz plz advise pt to monitor her BP needs appt within 1 month of starting new med Follow-up by: [name] DO,  November 01, 2009 5:10 PM  Additional Follow-up for Phone Call Additional follow up Details #1::        call returned to patient she has been advised per Dr Artist Pais instructions. 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 DO   Signed by:   [name] DO on 11/01/2009   Method used:   Electronically to        [facility]  [address] (retail)       [address]       [place], [place]  [address]       Ph: [phone]       Fax: [phone]   RxID:   [id number]

## 2010-03-01 NOTE — Miscellaneous (Signed)
Summary: Lab Orders for October   Clinical Lists Changes  Orders: Added new Test order of T-Basic Metabolic Panel [phone]) - Signed Added new Test order of T-TSH [phone]) - Signed Added new Test order of T- Hemoglobin A1C ([id number]) - Signed

## 2010-03-01 NOTE — Assessment & Plan Note (Signed)
Summary: New Patient/Brittney Cunningham   Vital Signs:  Patient profile:   70 year old female Height:      62 inches Weight:      213.75 pounds BMI:     39.24 O2 Sat:      99 % on Room air Temp:     97.5 degrees F oral Pulse rate:   73 / minute Pulse rhythm:   regular Resp:     22 per minute BP sitting:   122 / 68  (left arm) Cuff size:   large  Vitals Entered By: [name] CMA (Jun 10, 2009 9:18 AM)  O2 Flow:  Room air CC: Rm 2- New Patient   Primary Care Provider:  [name] DO  CC:  Rm 2- New Patient.  History of Present Illness: 70 y/o with complex medical history to establish.  she was prev followed by [facility]. april 2011 - saw PCP,  she was exp sob and heartburn EKG abnormal, seen by [name] who found carotid bruit found to have severe carotid stenosis bilaterally also had abnl stress test cardiac cath  4/21-  cardiolite noted abnormal uptake in right breast she has remote hx of left breast biopsy but has not had routine screening mammo in years  chest x ray from 06/07/2009 reviewed.  abnormal bone finding noted.  no workup to date  [name]  4/28 - right endarterectomy plan is for left side to be performed  pt found to have anemia - hg dropped to 6.6 ,  received 2 units of blood  hx of anemia - normocytic  DM II - 1998 , been on insulin for 5 yrs.   lost 97 lbs - since Jan 2009 lost significant amt of wt with last 6 months checks blood sugar once daily  pt accompanied by son and daugter who helped provide history hospital, cardiology, pcp medical records reviewed  Preventive Screening-Counseling & Management  Alcohol-Tobacco     Alcohol drinks/day: 0     Smoking Status: quit     Packs/Day: 1.0     Year Started: 1963     Year Quit: 1986  Caffeine-Diet-Exercise     Caffeine use/day: 2 beverages daily     Does Patient Exercise: no      Blood Transfusions:  yes.    Allergies (verified): 1)  ! Codeine 2)  ! Tylox  Past History:  Past  Medical History: Anemia   Depression Diabetes mellitus, type II  GERD Hyperlipidemia Hypothyroidism Osteoarthritis Carotid artery stenosis - bilateral CAD  OSA  Past Surgical History: Remote Left Breast Biopsy Caesarean section  GYN surgery Right Knee Surgery  Right carotid endarterectomy  Family History: Family History of Arthritis Family History of CAD Female 1st degree relative <60 Family History of Stroke F 1st degree relative <60    Social History: Retired ran a daycare facility Married- 43 years 2 sons  1 daughter  Former smoker Smoking Status:  quit Packs/Day:  1.0 Caffeine use/day:  2 beverages daily Does Patient Exercise:  no Blood Transfusions:  yes  Review of Systems       The patient complains of weight loss and dyspnea on exertion.  The patient denies chest pain, syncope, prolonged cough, abdominal pain, melena, hematochezia, and severe indigestion/heartburn.         feels weak All other systems were reviewed and were negative.   Physical Exam  General:  alert and overweight-appearing.   Head:  normocephalic and atraumatic.   Eyes:  pupils equal, pupils round, and pupils reactive to light.   Mouth:  Oral mucosa and oropharynx without lesions or exudates.   Neck:  bilateral carotid bruit,  right neck incision - no signs of infection,  no drainage or redness Lungs:  normal respiratory effort, normal breath sounds, no crackles, and no wheezes.   Heart:  normal rate, regular rhythm, no murmur, and no gallop.   Abdomen:  obese, non tender,  lower abd scar,  no masses,  unable to apprec hepatosplenomegaly Extremities:  trace left pedal edema and trace right pedal edema.   Neurologic:  cranial nerves II-XII intact.   Psych:  normally interactive, good eye contact, not anxious appearing, and not depressed appearing.    Diabetes Management Exam:    Foot Exam (with socks and/or shoes not present):       Inspection:          Left foot: normal           Right foot: normal   Impression & Recommendations:  Problem # 1:  ABNORMAL CHEST XRAY (ICD-793.1) CXR 06/07/2009 showed: 1. No acute cardiopulmonary disease.   2.  Similar diffuse interstitial thickening which is nonspecific.   Question chronic bronchitis or smoking.   3.  Heterogeneous marrow density with sclerotic foci.  Correlate   with any history of primary malignancy.  Consider further   evaluation with bone scan.  rule out metastatis dz - check bone scan.   I reviewed notes from her cardiologist.  Cardiolite localized to right breast.  check mammogram Check SPEP / UPEP - consider Multiple Myeloma  Orders: Radiology Referral (Radiology) Mammogram (Screening) (Mammo)  Problem # 2:  ANEMIA (ICD-285.9) Pt with hx of normocyctic anemia.  she reports being transfused 2 units after her carotid endarterectomy.  Hg of < 7 initiate work up.   colonoscopy was scheduled but on hold due to heart and carotid issues. Orders: T-Iron [id number]) T-Iron Binding Capacity (TIBC) ([id number]) T-Ferritin [id number]) T-Vitamin B12 [phone]) T- * Misc. Laboratory test [id number])  Problem # 3:  HYPOTHYROIDISM (ICD-244.9) Pt overtreated.  thyroid medication dose recently reduced.  repeat TSH  Her updated medication list for this problem includes:    Levothyroxine Sodium 137 Mcg Tabs (Levothyroxine sodium) .Marland Kitchen... 1 1/2 tablet by mouth once daily  Orders: T-TSH ([id number])  Problem # 4:  DIABETES MELLITUS, TYPE II (ICD-250.00) Her last A1c < 6.  We discussed importance of avoiding hypoglycemia.  she only checks her blood sugar once daily decrease lantus dose.  Target AM fasting blood sugar 120-130  Her updated medication list for this problem includes:    Metformin Hcl 1000 Mg Tabs (Metformin hcl) .Marland Kitchen... Take 1 tablet by mouth two times a day    Lantus 100 Unit/ml Soln (Insulin glargine) ..... Inject 45-50 units subcutaneously once daily    Benicar Hct 40-12.5 Mg Tabs (Olmesartan  medoxomil-hctz) .Marland Kitchen... Take 1 tablet by mouth once a day    Aspirin Ec 81 Mg Tbec (Aspirin) .Marland Kitchen... Take 1 tablet by mouth once a day  Problem # 5:  HYPERLIPIDEMIA (ICD-272.4) Goal LDL < 70 considering CAD and carotid stenosis  Her updated medication list for this problem includes:    Lipitor 40 Mg Tabs (Atorvastatin calcium) ..... One by mouth once daily  Problem # 6:  OTH NONSPC ABN FINDNG RAD&OTH EXM BODY STRUCTURE (ICD-793.99) cardiolite showed abnormal uptake in right breast.   arrange mammogram  Complete Medication List: 1)  Omeprazole 20 Mg Cpdr (Omeprazole) .... Take 1 capsule  by mouth once a day 2)  Metformin Hcl 1000 Mg Tabs (Metformin hcl) .... Take 1 tablet by mouth two times a day 3)  Lorazepam 0.5 Mg Tabs (Lorazepam) .... Take 1 tablet by mouth two times a day as needed 4)  Lipitor 40 Mg Tabs (Atorvastatin calcium) .... One by mouth once daily 5)  Levothyroxine Sodium 137 Mcg Tabs (Levothyroxine sodium) .Marland Kitchen.. 1 1/2 tablet by mouth once daily 6)  Lantus 100 Unit/ml Soln (Insulin glargine) .... Inject 45-50 units subcutaneously once daily 7)  Garlic Oil 1000 Mg Caps (Garlic) .... Take 1 capsule by mouth once a day 8)  Fish Oil 1200 Mg Caps (Omega-3 fatty acids) .... Take 1 capsule by mouth once a day 9)  Benicar Hct 40-12.5 Mg Tabs (Olmesartan medoxomil-hctz) .... Take 1 tablet by mouth once a day 10)  Aspirin Ec 81 Mg Tbec (Aspirin) .... Take 1 tablet by mouth once a day 11)  Hydrocodone-acetaminophen 5-500 Mg Tabs (Hydrocodone-acetaminophen) .... One tablet by mouth every 4 hours as needed  Other Orders: T-Basic Metabolic Panel [id number])  Patient Instructions: 1)  Please schedule a follow-up appointment in 2 weeks. Prescriptions: LIPITOR 40 MG TABS (ATORVASTATIN CALCIUM) one by mouth once daily  #30 x 3   Entered and Authorized by:   [name] DO   Signed by:   [name] DO on 06/10/2009   Method used:   Print then Give to Patient   RxID:    [id number]    Preventive Care Screening  Last Pneumovax:    Date:  10/12/2008    Results:  given   Last Flu Shot:    Date:  10/12/2008    Results:  given

## 2010-03-01 NOTE — Progress Notes (Signed)
Summary: diabetic testing supply form?  Phone Note Call from Patient Call back at [phone]   Caller: Patient Call For: [name] DO Summary of Call: Received voicemail from Adam at [facility] wanting to know if we received their from for diabetic testing supplies.  Advised him I had not seen form come through. Fax # verified. He states that pt has gotten supplies from them since 2009 and they need auth. signed by Dr. Artist Pais to continue her supplies. He states he will refax form.  Mervin Kung CMA  July 16, 2009 2:20 PM   Follow-up for Phone Call        forms faxed  Follow-up by: [name] CMA,  July 20, 2009 8:16 AM

## 2010-03-01 NOTE — Progress Notes (Signed)
Summary: Alprazolam Refill  Phone Note Refill Request Message from:  Fax from Pharmacy on September 13, 2009 3:19 PM  Refills Requested: Medication #1:  ALPRAZOLAM 0.5 MG TABS one by mouth once daily as needed   Dosage confirmed as above?Dosage Confirmed   Brand Name Necessary? No   Supply Requested: 1 month   Last Refilled: 08/07/2009  Method Requested: Telephone to Pharmacy Next Appointment Scheduled: None Initial call taken by: [name] CMA,  September 13, 2009 3:20 PM  Follow-up for Phone Call        ok to refill x 3 Follow-up by: [name] DO,  September 13, 2009 3:51 PM  Additional Follow-up for Phone Call Additional follow up Details #1::        Rx called to pharmacy to [facility] Additional Follow-up by: [name] CMA,  September 13, 2009 5:38 PM    Prescriptions: ALPRAZOLAM 0.5 MG TABS (ALPRAZOLAM) one by mouth once daily as needed  #30 x 3   Entered by:   [name] CMA   Authorized by:   [name] DO   Signed by:   [name] CMA on 09/13/2009   Method used:   Telephoned to ...       [facility] [address] [facility] (retail)       [address]       [place], [place]  [address]       Ph: [phone]       Fax: [phone]   RxID:   [id number]

## 2010-03-01 NOTE — Progress Notes (Signed)
Summary: PATIENT RECORDS REC   Phone Note From Other Clinic   Caller: [facility] Call For: YOO  Summary of Call: PATIENT RECORDS RECEIVED  Initial call taken by: Roselle Locus,  Jun 09, 2009 8:45 AM

## 2010-03-01 NOTE — Progress Notes (Signed)
Summary: Mammogram Status  Phone Note Other Incoming   Caller: Dr Deboraha Sprang with the [facility] Summary of Call: Dr Deboraha Sprang with the breast center call and stated patient was being seen today for repeat studies. The findings revealed suspicious right breast mass, that "looks like cancer", however the patient did not feel well enough to have a needle biopsy done today. She will return on 06/29/2009 @ 10am for the needle biopsy. Initial call taken by: [name] CMA,  Jun 14, 2009 12:50 PM  Follow-up for Phone Call        noted Follow-up by: [name] DO,  Jun 14, 2009 1:14 PM

## 2010-03-01 NOTE — Letter (Signed)
Summary: Primary Care Consult Scheduled Letter  [facility] at [facility]  [address]   [place], [place] [address]   Phone: [phone]  Fax: [phone]      06/11/2009 MRN: [id number]  Brittney Cunningham [address] [place], [place]  [address]    Dear Ms. Kage,      We have scheduled an appointment for you.  At the recommendation of Dr.YOO, we have scheduled you a consult with _[facility] on _MAY 27,2011 at 9AM * ARRIVE 8:30AM .  Their address is_[address] ,[place] . The office phone number is [phone].  If this appointment day and time is not convenient for you, please feel free to call the office of the doctor you are being referred to at the number listed above and reschedule the appointment.     It is important for you to keep your scheduled appointments. We are here to make sure you are given good patient care. If you have questions or you have made changes to your appointment, please notify us at  [phone]         , ask for              .    Thank you,  Patient Care Coordinator [place] at [facility]

## 2010-03-01 NOTE — Progress Notes (Signed)
Summary: Lab Results  Phone Note Call from Patient Call back at Home Phone [phone]   Caller: Spouse- [name] Call For: Brittney Spry DO Summary of Call: patients husband called and left voice message stating patient has a new patient appointment with Dr Artist Pais on Monday and she is concerned that her thyroid is low and needs an adjustment in her medcation. He would like to know if Dr Artist Pais would make the necessary adjustments before her office visit.  call was returned to patient,  patient states that she had blood work done Monday. She states the vein and vasucl[facility] center informed her of results . She was asked to have them fax a copy for Dr Artist Pais review, and she would recieve a return call. Initial call taken by: [name] CMA,  Jun 09, 2009 9:57 AM  Follow-up for Phone Call        patient returned call and left voice message stating vascular and vein advised her to call our office to forward lab results to [place]. She has provided the phone number [phone]. Call was placed to vascular and vein , voice message left with office information to fax requested labs Follow-up by: [name] CMA,  Jun 09, 2009 1:50 PM  Additional Follow-up for Phone Call Additional follow up Details #1::        call returned to patient.  She was informed that her records were received from [facility]. She has moved her appointment to Thursday 06/10/2009. She was informed that her concerns would be addressed at the office visit with Dr Artist Pais. Patient verbalized understanding and agrees  Additional Follow-up by: [name] CMA,  Jun 09, 2009 3:18 PM

## 2010-03-01 NOTE — Letter (Signed)
[facility]   Imported By: Lanelle Bal 07/13/2009 11:24:35  _____________________________________________________________________  External Attachment:    Type:   Image     Comment:   External Document

## 2010-03-01 NOTE — Assessment & Plan Note (Signed)
Summary: 2 month fu/dt   Vital Signs:  Patient profile:   70 year old female Weight:      227.50 pounds BMI:     41.76 O2 Sat:      99 % on Room air Temp:     98.0 degrees F oral Pulse rate:   66 / minute Pulse rhythm:   regular Resp:     18 per minute BP sitting:   120 / 70  (left arm) Cuff size:   large  Vitals Entered By: [name] CMA (August 23, 2009 11:03 AM)  O2 Flow:  Room air CC: Rm 2- 2 Month Follow up Is Patient Diabetic? Yes Did you bring your meter with you today? No Pain Assessment Patient in pain? no      Comments c/ o fatigue, blood augar avg 90's, this morning blood sugar was 82, bone density was done last week at the breast center she awaiting results,   Primary Care Provider:  DThomos Lemons DO  CC:  Rm 2- 2 Month Follow up.  History of Present Illness: 70 y/o white female with metastatic stage IV breast ca for f/u taking firma (letrozole)  DM II -  several BS readings < 100.    wt gain  Allergies: 1)  ! Codeine 2)  ! Tylox  Past History:  Past Medical History: Anemia   Depression Diabetes mellitus, type II   GERD  Hyperlipidemia Hypothyroidism Osteoarthritis Carotid artery stenosis - bilateral CAD  OSA Right breast cancer - invasive ductal carcinoma  (Stage IV)  Past Surgical History: Remote Left Breast Biopsy  Caesarean section  GYN surgery Right Knee Surgery  Right carotid endarterectomy porta cath  Family History: Family History of Arthritis Family History of CAD Female 1st degree relative <60 Family History of Stroke F 1st degree relative <60      Social History: Retired ran a daycare facility Married- 43 years  2 sons  1 daughter  Former smoker  Physical Exam  General:  alert, well-developed, and well-nourished.   Lungs:  normal respiratory effort, normal breath sounds, no crackles, and no wheezes.   Heart:  normal rate, regular rhythm, no murmur, and no gallop.   Extremities:  trace left pedal edema and  trace right pedal edema.     Impression & Recommendations:  Problem # 1:  HYPERKALEMIA (ICD-276.7) repeat BMET.  pt already avoiding high K foods Orders: T-Basic Metabolic Panel [phone])  Problem # 2:  DIABETES MELLITUS, TYPE II (ICD-250.00) Pt advised to continue to titrate lantus lower to avoid hypoglycemia  Her updated medication list for this problem includes:    Metformin Hcl 1000 Mg Tabs (Metformin hcl) .Marland Kitchen... Take 1/2  tablet by mouth two times a day    Lantus 100 Unit/ml Soln (Insulin glargine) ..... Inject 25-30 units subcutaneously once daily    Benicar Hct 40-12.5 Mg Tabs (Olmesartan medoxomil-hctz) .Marland Kitchen... Take 1 tablet by mouth once a day    Aspirin Ec 81 Mg Tbec (Aspirin) .Marland Kitchen... Take 1 tablet by mouth once a day  Labs Reviewed: Creat: 0.95 (08/16/2009)    Reviewed HgBA1c results: 5.9 (08/16/2009)  Complete Medication List: 1)  Omeprazole 20 Mg Cpdr (Omeprazole) .... Take 1 capsule by mouth once a day 2)  Metformin Hcl 1000 Mg Tabs (Metformin hcl) .... Take 1/2  tablet by mouth two times a day 3)  Alprazolam 0.5 Mg Tabs (Alprazolam) .... One by mouth once daily as needed 4)  Simvastatin 20 Mg Tabs (Simvastatin) .Marland KitchenMarland KitchenMarland Kitchen  One by mouth once daily 5)  Levothyroxine Sodium 150 Mcg Tabs (Levothyroxine sodium) .... One by mouth once daily 6)  Lantus 100 Unit/ml Soln (Insulin glargine) .... Inject 25-30 units subcutaneously once daily 7)  Benicar Hct 40-12.5 Mg Tabs (Olmesartan medoxomil-hctz) .... Take 1 tablet by mouth once a day 8)  Aspirin Ec 81 Mg Tbec (Aspirin) .... Take 1 tablet by mouth once a day 9)  Hydrocodone-acetaminophen 5-500 Mg Tabs (Hydrocodone-acetaminophen) .... One tablet by mouth every 4 hours as needed 10)  Calcium Infusion  .... Iv infusion once monthly 11)  Metoprolol Succinate 50 Mg Xr24h-tab (Metoprolol succinate) .... One by mouth qd  Patient Instructions: 1)  Please schedule a follow-up appointment in 3 months. 2)  BMP prior to visit, ICD-9:   401.9 3)  TSH prior to visit, ICD-9:  244.9 4)  HbgA1C prior to visit, ICD-9:  250.00 5)  Please return for lab work one (1) week before your next appointment.  Prescriptions: LEVOTHYROXINE SODIUM 150 MCG TABS (LEVOTHYROXINE SODIUM) one by mouth once daily  #30 x 3   Entered and Authorized by:   [name] DO   Signed by:   [name] DO on 08/23/2009   Method used:   Electronically to        [facility]  [address] (retail)       [address]       [place], [place]  [address]       Ph: [phone]       Fax: [phone]   RxID:   [id number] LANTUS 100 UNIT/ML SOLN (INSULIN GLARGINE) inject 25-30 units subcutaneously once daily  #2 bottles x 3   Entered and Authorized by:   [name] DO   Signed by:   [name] DO on 08/23/2009   Method used:   Electronically to        [facility]  [address] (retail)       [address]       [place], [place]  [address]       Ph: [phone]       Fax: [phone]   RxID:   [id number] SIMVASTATIN 20 MG TABS (SIMVASTATIN) one by mouth once daily  #30 x 3   Entered and Authorized by:   [name] DO   Signed by:   [name] DO on 08/23/2009   Method used:   Electronically to        [facility]  [address] (retail)       [address]       [place], [place]  [address]       Ph: [phone]       Fax: [phone]   RxID:   [id number]   Current Allergies (reviewed today): ! CODEINE ! TYLOX

## 2010-03-01 NOTE — Miscellaneous (Signed)
Summary: Future Lab Order  Clinical Lists Changes  Orders: Added new Test order of T-TSH [phone]) - Signed

## 2010-03-02 HISTORY — PX: MODIFIED RADICAL MASTECTOMY: SHX5703

## 2010-03-03 ENCOUNTER — Inpatient Hospital Stay (HOSPITAL_COMMUNITY)
Admission: RE | Admit: 2010-03-03 | Discharge: 2010-03-05 | DRG: 583 | Disposition: A | Discharge status: Home / Self Care | Payer: MEDICARE | Attending: General Surgery | Admitting: General Surgery

## 2010-03-03 ENCOUNTER — Other Ambulatory Visit: Payer: Self-pay | Admitting: General Surgery

## 2010-03-03 ENCOUNTER — Encounter: Payer: Self-pay | Admitting: Internal Medicine

## 2010-03-03 DIAGNOSIS — F341 Dysthymic disorder: Secondary | ICD-10-CM | POA: Diagnosis present

## 2010-03-03 DIAGNOSIS — E669 Obesity, unspecified: Secondary | ICD-10-CM | POA: Diagnosis present

## 2010-03-03 DIAGNOSIS — C50619 Malignant neoplasm of axillary tail of unspecified female breast: Principal | ICD-10-CM | POA: Diagnosis present

## 2010-03-03 DIAGNOSIS — Z7982 Long term (current) use of aspirin: Secondary | ICD-10-CM

## 2010-03-03 DIAGNOSIS — K219 Gastro-esophageal reflux disease without esophagitis: Secondary | ICD-10-CM | POA: Diagnosis present

## 2010-03-03 DIAGNOSIS — E119 Type 2 diabetes mellitus without complications: Secondary | ICD-10-CM | POA: Diagnosis present

## 2010-03-03 DIAGNOSIS — Z794 Long term (current) use of insulin: Secondary | ICD-10-CM

## 2010-03-03 DIAGNOSIS — I10 Essential (primary) hypertension: Secondary | ICD-10-CM | POA: Diagnosis present

## 2010-03-03 DIAGNOSIS — Z79899 Other long term (current) drug therapy: Secondary | ICD-10-CM

## 2010-03-03 DIAGNOSIS — G4733 Obstructive sleep apnea (adult) (pediatric): Secondary | ICD-10-CM | POA: Diagnosis present

## 2010-03-03 LAB — GLUCOSE, CAPILLARY
Glucose-Capillary: 144 mg/dL — ABNORMAL HIGH (ref 70–99)
Glucose-Capillary: 116 mg/dL — ABNORMAL HIGH (ref 70–99)
Glucose-Capillary: 136 mg/dL — ABNORMAL HIGH (ref 70–99)
Glucose-Capillary: 201 mg/dL — ABNORMAL HIGH (ref 70–99)

## 2010-03-03 NOTE — Letter (Signed)
Summary: [facility]  [facility]   Imported By: Lanelle Bal 02/10/2010 11:03:48  _____________________________________________________________________  External Attachment:    Type:   Image     Comment:   External Document

## 2010-03-03 NOTE — Progress Notes (Signed)
Summary: med change  Phone Note From Pharmacy Call back at [phone]   Caller: Barbara Cower @ [facility] Call For: Dr Artist Pais  Summary of Call: Pharmacy called stating that Losartan Potassium--HCTZ is no longer being manufactured. Wants to know what they can substitute this med with? Please advise. Initial call taken by: Mervin Kung CMA Duncan Dull),  February 07, 2010 12:14 PM  Follow-up for Phone Call        change to individual meds Follow-up by: [name] DO,  February 07, 2010 5:26 PM  Additional Follow-up for Phone Call Additional follow up Details #1::        Left voice mail on [facility] of med change. Left detailed message on pts voice mail re: change and to call if any questions. Left voice mail on [facility] voicemail to cancel Rxs.  Nicki Guadalajara Fergerson CMA (AAMA)  February 08, 2010 8:11 AM     New/Updated Medications: LOSARTAN POTASSIUM 100 MG TABS (LOSARTAN POTASSIUM) one by mouth qd HYDROCHLOROTHIAZIDE 12.5 MG CAPS (HYDROCHLOROTHIAZIDE) one by mouth once daily Prescriptions: HYDROCHLOROTHIAZIDE 12.5 MG CAPS (HYDROCHLOROTHIAZIDE) one by mouth once daily  #30 x 1   Entered and Authorized by:   [name] DO   Signed by:   [name] DO on 02/07/2010   Method used:   Electronically to        [facility]  [address] (retail)       [address]       [place], [place]  [address]       Ph: [phone]       Fax: [phone]   RxID:   [id number] LOSARTAN POTASSIUM 100 MG TABS (LOSARTAN POTASSIUM) one by mouth qd  #30 x 1   Entered and Authorized by:   [name] DO   Signed by:   [name] DO on 02/07/2010   Method used:   Electronically to        [facility]  [address] (retail)       [address]       [place], [place]  [address]       Ph: [phone]       Fax: [phone]   RxID:   [id number]

## 2010-03-03 NOTE — Letter (Signed)
Summary: [facility]   Imported By: Lanelle Bal 02/22/2010 14:18:41  _____________________________________________________________________  External Attachment:    Type:   Image     Comment:   External Document

## 2010-03-03 NOTE — Progress Notes (Signed)
Summary: Blood Pressure readings  Phone Note Call from Patient Call back at Home Phone [phone]   Caller: Patient Call For: [name] DO Summary of Call: patient called to state her blood pressure reading were started  January 18th  180/80  162/72  168/78  167/72   the most recent reading was  last night at 180/82- 76 , she is scheduled for breast surgery on February 2. Initial call taken by: [name] CMA,  February 23, 2010 8:41 AM  Follow-up for Phone Call        stop losartan and hctz go back to taking benicar/hctz.  pt can pick up samples Follow-up by: [name] DO,  February 24, 2010 5:30 PM  Additional Follow-up for Phone Call Additional follow up Details #1::        call placed to patient at [phone], she has been informed per Dr Artist Pais instructions. Samples for Benicar have been left at front desk for patient pick up. She states he sister will be by to pick up for her. Additional Follow-up by: [name] CMA,  February 25, 2010 8:28 AM    New/Updated Medications: BENICAR HCT 40-12.5 MG TABS (OLMESARTAN MEDOXOMIL-HCTZ) one by mouth once daily Prescriptions: BENICAR HCT 40-12.5 MG TABS (OLMESARTAN MEDOXOMIL-HCTZ) one by mouth once daily  #30 x 2   Entered and Authorized by:   [name] DO   Signed by:   [name] DO on 02/24/2010   Method used:   Faxed to ...       [facility] (retail)       [address]       [place], [place]  [address]       Ph: [phone]       Fax: [phone]   RxID:   [id number]

## 2010-03-03 NOTE — Letter (Signed)
   [place] at [facility] [address] [place], [place]  [address]  [place] Phone: [phone]      February 17, 2010   Brittney Cunningham [address] [facility] [facility] [name], [place] [address]  RE:  LAB RESULTS  Dear  Brittney Cunningham,  The following is an interpretation of your most recent lab tests.  Please take note of any instructions provided or changes to medications that have resulted from your lab work.  ELECTROLYTES:  Good - no changes needed  KIDNEY FUNCTION TESTS:  Good - no changes needed  THYROID STUDIES:  Thyroid studies normal TSH: 3.372     DIABETIC STUDIES:  Good - no changes needed Blood Glucose: 124   HgbA1C: 6.5          Sincerely Yours,    Dr. Thomos Lemons  Appended Document:  mailed

## 2010-03-03 NOTE — Progress Notes (Signed)
Summary: Low BP  Phone Note Call from Patient Call back at Home Phone [phone]   Summary of Call: Metoprolol changed to Carvedilol 12.5 1 tablet two times a day by Dr [place] due to cost on Sunday.  Has not been taking Losartan Potassium x 2days. BP today at Dr Verne Carrow was 99/54. Pt states she feels tired.  Pt would like to know if she should still fill the Losartan and HCTZ scripts. Please advise. Nicki Guadalajara Fergerson CMA Duncan Dull)  February 08, 2010 11:54 AM   Follow-up for Phone Call        no hold losartan and hctz OV within 1 week Follow-up by: [name] DO,  February 08, 2010 1:38 PM  Additional Follow-up for Phone Call Additional follow up Details #1::        call returned to patient at [phone], she has been advised per Dr Artist Pais instructions. Appointment has been scheduled for 02/15/2010 @ 11am Additional Follow-up by: [name] CMA,  February 08, 2010 2:09 PM

## 2010-03-03 NOTE — Assessment & Plan Note (Signed)
Summary: BLOOD PRESSURE F/U-DK   Vital Signs:  Patient profile:   70 year old female Height:      62 inches Weight:      245 pounds BMI:     44.97 O2 Sat:      98 % on Room air Temp:     98.4 degrees F oral Pulse rate:   67 / minute Resp:     18 per minute BP sitting:   160 / 70  (left arm) Cuff size:   large  Vitals Entered By: [name] CMA (February 15, 2010 10:50 AM)  O2 Flow:  Room air CC: follow-up visit Is Patient Diabetic? Yes Pain Assessment Patient in pain? no      Comments states she took the last Losartan Thursday of last week, she was informed the Losartan/hctz would no longer be manufactured, is scheduled for surgery feb 2, for breast surgery, yesterday 152/ ? left arm and right arm 160/?.   Primary Care Provider:  [name] DO  CC:  follow-up visit.  History of Present Illness: 70 y/o white female with metastatic breast ca, htn, DM II, and carotid artery stenosis for f/u  her cardiologist - [name] recently changed her metoprolol to carvedilol she experienced low bp at oncoloigist despite not taking ARB and hctz x 3 days  pt was instructed to hold losartan and hctz until today's visit mild headaches recently more fatigue since switch to carvedilol  bp check at oncology showed bp back up to 150's systolic   DM II - weight gain but blood sugars are fairly stable  Preventive Screening-Counseling & Management  Alcohol-Tobacco     Smoking Status: never  Allergies: 1)  ! Codeine 2)  ! Tylox  Past History:  Past Medical History: Anemia   Depression Diabetes mellitus, type II    GERD  Hyperlipidemia Hypothyroidism Osteoarthritis Carotid artery stenosis - bilateral CAD  OSA Right breast cancer - invasive ductal carcinoma  (Stage IV)  Social History: Smoking Status:  never  Review of Systems       fatigue. increased joint pains  Physical Exam  General:  alert and overweight-appearing.   Head:  normocephalic and  atraumatic.   Lungs:  normal respiratory effort, normal breath sounds, no crackles, and no wheezes.   Heart:  normal rate, regular rhythm, no murmur, and no gallop.   Extremities:  trace left pedal edema and trace right pedal edema.   Neurologic:  cranial nerves II-XII intact.  ambulates with two canes Psych:  normally interactive, good eye contact, not anxious appearing, and not depressed appearing.     Impression & Recommendations:  Problem # 1:  HYPERTENSION (ICD-401.9) pt experienced transient low BP with switching from metoprolol to carvedilol add back ARB and HCTZ at lower dose pt advised to call with home BP readings next week unclear whether oxycodone use contributing to transient low BP  The following medications were removed from the medication list:    Metoprolol Succinate 50 Mg Xr24h-tab (Metoprolol succinate) ..... One by mouth qd    Losartan Potassium 100 Mg Tabs (Losartan potassium) ..... One by mouth qd    Hydrochlorothiazide 12.5 Mg Caps (Hydrochlorothiazide) ..... One by mouth once daily Her updated medication list for this problem includes:    Carvedilol 12.5 Mg Tabs (Carvedilol) .Marland Kitchen... Take 1 tablet by mouth two times a day    Losartan Potassium 50 Mg Tabs (Losartan potassium) ..... One by mouth once daily    Hydrochlorothiazide 12.5 Mg  Tabs (Hydrochlorothiazide) ..... One by mouth once daily  Orders: T-Basic Metabolic Panel [phone])  Problem # 2:  DIABETES MELLITUS, TYPE II (ICD-250.00) Assessment: Deteriorated wt gain.  she notes cancer meds increase her appetite monitor A1c  The following medications were removed from the medication list:    Losartan Potassium 100 Mg Tabs (Losartan potassium) ..... One by mouth qd Her updated medication list for this problem includes:    Metformin Hcl 1000 Mg Tabs (Metformin hcl) .Marland Kitchen... Take 1/2  tablet by mouth two times a day    Lantus 100 Unit/ml Soln (Insulin glargine) ..... Inject 25-30 units subcutaneously once  daily    Aspirin Ec 81 Mg Tbec (Aspirin) .Marland Kitchen... Take 1 tablet by mouth once a day    Losartan Potassium 50 Mg Tabs (Losartan potassium) ..... One by mouth once daily  Orders: T-Basic Metabolic Panel [phone]) T- Hemoglobin A1C ([id number])  Problem # 3:  HYPOTHYROIDISM (ICD-244.9) Assessment: Deteriorated wt gain.  monitor TSH  Her updated medication list for this problem includes:    Levothyroxine Sodium 150 Mcg Tabs (Levothyroxine sodium) ..... One by mouth once daily  Orders: T-TSH ([id number])  Problem # 4:  CAROTID ARTERY STENOSIS (ICD-433.10) pt advised by her vascular specialist she may need left carotid endarterectomy.   surgery deferred until mastectomy completed  Her updated medication list for this problem includes:    Aspirin Ec 81 Mg Tbec (Aspirin) .Marland Kitchen... Take 1 tablet by mouth once a day  Problem # 5:  NEOPLASM, MALIGNANT, RIGHT BREAST (ICD-174.9) followed by oncology mastectomy planned survival advantage with mastectomy according to Dr. Aaron Edelman pt on  letrozole and zometa  pt on oxycodone/APAP due to aches and pains after receiving zometa  Complete Medication List: 1)  Omeprazole 20 Mg Cpdr (Omeprazole) .... Take 1 capsule by mouth once a day 2)  Metformin Hcl 1000 Mg Tabs (Metformin hcl) .... Take 1/2  tablet by mouth two times a day 3)  Alprazolam 0.5 Mg Tabs (Alprazolam) .... One by mouth once daily as needed 4)  Simvastatin 20 Mg Tabs (Simvastatin) .... One by mouth once daily 5)  Levothyroxine Sodium 150 Mcg Tabs (Levothyroxine sodium) .... One by mouth once daily 6)  Lantus 100 Unit/ml Soln (Insulin glargine) .... Inject 25-30 units subcutaneously once daily 7)  Aspirin Ec 81 Mg Tbec (Aspirin) .... Take 1 tablet by mouth once a day 8)  Carvedilol 12.5 Mg Tabs (Carvedilol) .... Take 1 tablet by mouth two times a day 9)  Oxycodone-acetaminophen 5-325 Mg Tabs (Oxycodone-acetaminophen) .... Take 1 tablet by mouth three times a day with advil 200mg  10)   Cyanocobalamin 1000 Mcg/ml Soln (Cyanocobalamin) .... Injection monthly 11)  Zometa 4 Mg/171ml Soln (Zoledronic acid) .... Administered via iv once a month 12)  Losartan Potassium 50 Mg Tabs (Losartan potassium) .... One by mouth once daily 13)  Hydrochlorothiazide 12.5 Mg Tabs (Hydrochlorothiazide) .... One by mouth once daily  Patient Instructions: 1)  Please schedule a follow-up appointment in 2 months. 2)  Call our office with your blood pressure readings next week Prescriptions: HYDROCHLOROTHIAZIDE 12.5 MG TABS (HYDROCHLOROTHIAZIDE) one by mouth once daily  #90 x 1   Entered and Authorized by:   [name] DO   Signed by:   [name] DO on 02/15/2010   Method used:   Faxed to ...       [facility] (retail)       [address]       [place], [place]  [address]       Ph: [phone]       Fax: [phone]   RxID:   [id number] LOSARTAN POTASSIUM 50 MG TABS (LOSARTAN POTASSIUM) one by mouth once daily  #30 x 3   Entered and Authorized by:   [name] DO   Signed by:   [name] DO on 02/15/2010   Method used:   Faxed to ...       [facility] (retail)       [address]       [place], [place]  [address]       Ph: [phone]       Fax: [phone]   RxID:   [id number]    Orders Added: 1)  T-Basic Metabolic Panel [phone] 2)  T- Hemoglobin A1C [83036-23375] 3)  T-TSH [73220-25427] 4)  Est. Patient Level IV [[address]]   Immunization History:  Influenza Immunization History:    Influenza:  historical (10/12/2009)   Immunization History:  Influenza Immunization History:    Influenza:  Historical (10/12/2009)  Current Allergies (reviewed today): ! CODEINE ! TYLOX

## 2010-03-03 NOTE — Progress Notes (Signed)
Summary: Medication Refill  Phone Note Refill Request Message from:  Pharmacy on February 04, 2010 2:58 PM  Refills Requested: Medication #1:  SIMVASTATIN 20 MG TABS one by mouth once daily   Dosage confirmed as above?Dosage Confirmed   Brand Name Necessary? No   Supply Requested: 1 month   Last Refilled: 01/07/2010  Medication #2:  LOSARTAN POTASSIUM-HCTZ 100-12.5 MG TABS one by mouth once daily   Dosage confirmed as above?Dosage Confirmed   Brand Name Necessary? No   Supply Requested: 1 month   Last Refilled: 01/07/2010 [facility] [address] [place] [address] FAX [phone]   Method Requested: Electronic Next Appointment Scheduled: NONE Initial call taken by: Roselle Locus,  February 04, 2010 2:59 PM  Follow-up for Phone Call        Rx faxed to pharmacy Follow-up by: [name] CMA,  February 04, 2010 5:02 PM    Prescriptions: LOSARTAN POTASSIUM-HCTZ 100-12.5 MG TABS (LOSARTAN POTASSIUM-HCTZ) one by mouth once daily  #30 x 1   Entered by:   [name] CMA   Authorized by:   [name] DO   Signed by:   [name] CMA on 02/04/2010   Method used:   Faxed to ...       [facility] (retail)       [address]       [place], [place]  [address]       Ph: [phone]       Fax: [phone]   RxID:   [id number] SIMVASTATIN 20 MG TABS (SIMVASTATIN) one by mouth once daily  #30 x 1   Entered by:   [name] CMA   Authorized by:   [name] DO   Signed by:   [name] CMA on 02/04/2010   Method used:   Faxed to ...       [facility] (retail)       [address]       [place], [place]  [address]       Ph: [phone]       Fax: [phone]   RxID:   [id number]

## 2010-03-04 LAB — GLUCOSE, CAPILLARY
Glucose-Capillary: 178 mg/dL — ABNORMAL HIGH (ref 70–99)
Glucose-Capillary: 171 mg/dL — ABNORMAL HIGH (ref 70–99)

## 2010-03-04 NOTE — Letter (Signed)
Summary: [facility]   Imported By: Lanelle Bal 07/06/2009 13:32:04  _____________________________________________________________________  External Attachment:    Type:   Image     Comment:   External Document

## 2010-03-04 NOTE — Letter (Signed)
Summary: Metformin Instructions/[facility]  Metformin Instructions/[facility]   Imported By: Lanelle Bal 07/07/2009 14:09:23  _____________________________________________________________________  External Attachment:    Type:   Image     Comment:   External Document

## 2010-03-04 NOTE — Op Note (Signed)
Brittney Cunningham, MITTMAN              ACCOUNT NO.:  [id number]  MEDICAL RECORD NO.:  [id number]           PATIENT TYPE:  I  LOCATION:  5158                         FACILITY:  [facility]  PHYSICIAN:  Juanetta Gosling, MDDATE OF BIRTH:  [birth year]  DATE OF PROCEDURE: DATE OF DISCHARGE:                              OPERATIVE REPORT   PREOPERATIVE DIAGNOSIS:  Stage IV right breast cancer.  POSTOPERATIVE DIAGNOSIS:  Stage IV right breast cancer.  PROCEDURE:  Right simple mastectomy.  SURGEON:  Juanetta Gosling, MD.  ASSISTANT:  None.  ANESTHESIA:  General.  SUPERVISING ANESTHESIOLOGIST:  Sheldon Silvan, M.D.  SPECIMENS:  Right breast tissue marked, long stitch lateral and short stitch superior.  DRAINS:  Two 19-French Blake drains to the subcutaneous space.  ESTIMATED BLOOD LOSS:  Less than 50 mL.  COMPLICATIONS:  None.  DISPOSITION:  To recovery room in stable condition.  INDICATIONS:  Brittney Cunningham is a 70 year old female whom we saw in May 2011 after she had been diagnosed with a stage IV right breast cancer. She had bony metastases at that time.  Since then, she has undergone letrozole and zoledronic acid.  Her last MRI was done in November 2011, it showed an irregular enhancing mass in the right axillary tail measuring 4.2 x 2 x 1 cm compared with a prior 5.7 x 3.4 x 2.2 cm. There are two right axillary nodes that are seen, that are not enlarged. There are no abnormal enhancement in the left breast.  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  She tolerated her neoadjuvant endocrine therapy well.  She does have a new bone scan as well, which shows no abnormal spots and what appears to be a good response to her treatment.  She was then referred back by Dr. Arlice Colt for discussion of surgery for her stage IV breast cancer.  She has really done well, and we wanted to maximize her local control issues at this time.  She  was discussed in multidisciplinary breast conference, and we all agreed that a simple mastectomy on the right side would be the best option for local control at this point as it appears her disease is well controlled right now.  I discussed this procedure with her along with the risks and benefits.  PROCEDURE:  After informed consent was obtained, patient was taken to the operating room.  She was administered 1 g of intravenous cefazolin. Sequential compression devices were placed in lower extremities prior to induction of anesthesia.  She was then placed under general anesthesia without complication.  Her right breast was prepped and draped in standard sterile surgical fashion with Betadine as she said she had a ChloraPrep allergy.  A surgical time-out was then performed.  I made a large elliptical incision encompassing a fair amount of skin as well as the nipple-areolar complex.  I carried the flaps to the clavicle superiorly.  I placed a port on this side as I was unable to place a wire on the left subclavian vein, so I lifted the port up off the fascia and then resutured this to the muscle with a  2-0 Prolene suture.  I took the flaps to the sternum medially, inferiorly below the inframammary crease onto the rectus abdominis and laterally I took it out to her latissimus as well.  The breast was then rolled off the pectoralis muscle including the pectoralis fascia.  This was then taken laterally. There were no really enlarged nodes or palpable nodes that I could feel in her axilla, and I divided the breast overlying the muscle.  This was then passed off the table as a specimen.  Hemostasis was then obtained after cauterizing numerous areas.  I then irrigated copiously to remove any further fat.  I did remove additional little bit skin on the superior flap just to get the wound to close a little bit better and be flapped.  I then placed two 19-French Blake drains, secured these with  2- 0 nylon.  I then closed her dermis with multiple 3-0 interrupted Vicryl sutures.  The skin was closed with 4-0 Monocryl in a subcuticular fashion.  Steri-Strips and a sterile dressing were placed.  A breast binder was placed overlying this.  She tolerated this well, was extubated in the operating room and transferred to the recovery room in stable condition.     Juanetta Gosling, MD     MCW/MEDQ  D:  03/03/2010  T:  03/04/2010  Job:  [id number]  Electronically Signed by Emelia Loron MD on 03/04/2010 01:29:28 PM

## 2010-03-05 LAB — GLUCOSE, CAPILLARY: Glucose-Capillary: 141 mg/dL — ABNORMAL HIGH (ref 70–99)

## 2010-03-07 ENCOUNTER — Telehealth: Payer: Self-pay | Admitting: Internal Medicine

## 2010-03-09 ENCOUNTER — Encounter: Payer: Self-pay | Admitting: Internal Medicine

## 2010-03-09 NOTE — Letter (Signed)
Summary: [facility]  [facility]   Imported By: Sherian Rein 03/01/2010 09:05:21  _____________________________________________________________________  External Attachment:    Type:   Image     Comment:   External Document

## 2010-03-17 ENCOUNTER — Telehealth: Payer: Self-pay | Admitting: Internal Medicine

## 2010-03-17 NOTE — Progress Notes (Signed)
Summary: Llevothyroxine Refill  Phone Note Refill Request Message from:  Fax from Pharmacy on March 07, 2010 11:17 AM  Refills Requested: Medication #1:  LEVOTHYROXINE SODIUM 150 MCG TABS one by mouth once daily   Dosage confirmed as above?Dosage Confirmed   Brand Name Necessary? No   Supply Requested: 1 month   Last Refilled: 02/04/2010 [facility] [address] [place] [address] fax [phone]   Method Requested: Electronic Next Appointment Scheduled: none Initial call taken by: Elba Barman,  March 07, 2010 11:18 AM  Follow-up for Phone Call        Rx completed in Dr. Tiajuana Amass Follow-up by: [name] CMA,  March 08, 2010 9:57 AM    Prescriptions: LEVOTHYROXINE SODIUM 150 MCG TABS (LEVOTHYROXINE SODIUM) one by mouth once daily  #30 x 3   Entered by:   [name] CMA   Authorized by:   [name] DO   Signed by:   [name] CMA on 03/08/2010   Method used:   Faxed to ...       [facility] (retail)       [address]       [place], [place]  [address]       Ph: [phone]       Fax: [phone]   RxID:   [id number]

## 2010-03-23 NOTE — Progress Notes (Signed)
Summary: refill-losartan potassium  Phone Note Refill Request Message from:  Fax from Pharmacy on March 17, 2010 11:01 AM  Refills Requested: Medication #1:  losartan potassium 100mg  take one tablet by mouth every day   Brand Name Necessary? No   Supply Requested: 1 month   Last Refilled: 02/08/2010 [facility] [address] [place] [address] fax [phone]   Method Requested: Electronic Next Appointment Scheduled: none Initial call taken by: Elba Barman,  March 17, 2010 11:03 AM  Follow-up for Phone Call        call placed to patient at [phone], no answer. A detailed voice message was left for patient to return call regarding rx refill. Need to find out if patient is taking Losartan Potassium 100mg . Follow-up by: [name] CMA,  March 17, 2010 4:35 PM  Additional Follow-up for Phone Call Additional follow up Details #1::        call placed to patient at [phone],  she was asked about the Losartan. She states she is not taking the medication. She is taking Benicar, and she has informed the pharmacy to remove the Losartan from her list of medications Additional Follow-up by: [name] CMA,  March 18, 2010 11:49 AM

## 2010-03-29 NOTE — Letter (Signed)
Summary: [facility]   Imported By: Maryln Gottron 03/24/2010 15:19:57  _____________________________________________________________________  External Attachment:    Type:   Image     Comment:   External Document

## 2010-04-04 ENCOUNTER — Telehealth: Payer: Self-pay | Admitting: Internal Medicine

## 2010-04-05 ENCOUNTER — Other Ambulatory Visit: Payer: Self-pay | Admitting: Oncology

## 2010-04-05 ENCOUNTER — Encounter (HOSPITAL_BASED_OUTPATIENT_CLINIC_OR_DEPARTMENT_OTHER): Payer: MEDICARE | Admitting: Oncology

## 2010-04-05 DIAGNOSIS — C50419 Malignant neoplasm of upper-outer quadrant of unspecified female breast: Secondary | ICD-10-CM

## 2010-04-05 DIAGNOSIS — E538 Deficiency of other specified B group vitamins: Secondary | ICD-10-CM

## 2010-04-05 DIAGNOSIS — C7951 Secondary malignant neoplasm of bone: Secondary | ICD-10-CM

## 2010-04-05 LAB — CBC WITH DIFFERENTIAL/PLATELET
BASO%: 0.4 % (ref 0.0–2.0)
LYMPH%: 31.9 % (ref 14.0–49.7)
MCHC: 32.1 g/dL (ref 31.5–36.0)
MONO#: 0.8 10*3/uL (ref 0.1–0.9)
RBC: 3.44 10*6/uL — ABNORMAL LOW (ref 3.70–5.45)
WBC: 9.2 10*3/uL (ref 3.9–10.3)
lymph#: 2.9 10*3/uL (ref 0.9–3.3)
nRBC: 0 % (ref 0–0)

## 2010-04-05 LAB — COMPREHENSIVE METABOLIC PANEL
ALT: 9 U/L (ref 0–35)
AST: 13 U/L (ref 0–37)
Albumin: 4 g/dL (ref 3.5–5.2)
Alkaline Phosphatase: 76 U/L (ref 39–117)
Chloride: 103 mEq/L (ref 96–112)
Creatinine, Ser: 1.03 mg/dL (ref 0.40–1.20)
Potassium: 4.2 mEq/L (ref 3.5–5.3)

## 2010-04-05 LAB — CANCER ANTIGEN 27.29: CA 27.29: 76 U/mL — ABNORMAL HIGH (ref 0–39)

## 2010-04-12 NOTE — Progress Notes (Signed)
Summary: Medication Refill  Phone Note Refill Request Message from:  Fax from Pharmacy on April 04, 2010 9:51 AM  Refills Requested: Medication #1:  SIMVASTATIN 20 MG TABS one by mouth once daily   Dosage confirmed as above?Dosage Confirmed   Brand Name Necessary? No   Supply Requested: 1 month   Last Refilled: 03/02/2010  Medication #2:  ALPRAZOLAM 0.5 MG TABS one by mouth once daily as needed   Dosage confirmed as above?Dosage Confirmed   Brand Name Necessary? No   Supply Requested: 1 month   Last Refilled: 03/02/2010 [facility] [address] [place] [address] fax [phone]   Method Requested: Electronic Next Appointment Scheduled: none Initial call taken by: Elba Barman,  April 04, 2010 9:52 AM  Follow-up for Phone Call        ok to refill x 3 Follow-up by: [name] DO,  April 04, 2010 2:36 PM  Additional Follow-up for Phone Call Additional follow up Details #1::        Rx called to pharmacy voice mail line Additional Follow-up by: [name] CMA,  April 05, 2010 8:34 AM    Prescriptions: SIMVASTATIN 20 MG TABS (SIMVASTATIN) one by mouth once daily  #30 x 3   Entered by:   [name] CMA   Authorized by:   [name] DO   Signed by:   [name] CMA on 04/05/2010   Method used:   Telephoned to ...       [facility] (retail)       [address]       [place], [place]  [address]       Ph: [phone]       Fax: [phone]   RxID:   [id number] ALPRAZOLAM 0.5 MG TABS (ALPRAZOLAM) one by mouth once daily as needed  #30 x 3   Entered by:   [name] CMA   Authorized by:   [name] DO   Signed by:   [name] CMA on 04/05/2010   Method used:   Telephoned to ...       [facility]* (retail)       [address]       [place], [place]  [address]       Ph: [phone]       Fax: [phone]   RxID:   [id number]

## 2010-04-17 LAB — DIFFERENTIAL
Basophils Relative: 1 % (ref 0–1)
Lymphs Abs: 2.7 10*3/uL (ref 0.7–4.0)
Monocytes Relative: 7 % (ref 3–12)
Neutro Abs: 3.1 10*3/uL (ref 1.7–7.7)
Neutrophils Relative %: 48 % (ref 43–77)

## 2010-04-17 LAB — CBC
RBC: 2.72 MIL/uL — ABNORMAL LOW (ref 3.87–5.11)
WBC: 6.5 10*3/uL (ref 4.0–10.5)

## 2010-04-17 LAB — BASIC METABOLIC PANEL
Calcium: 9.2 mg/dL (ref 8.4–10.5)
Creatinine, Ser: 0.82 mg/dL (ref 0.4–1.2)
GFR calc Af Amer: >60 mL/min (ref >60)

## 2010-04-17 LAB — PROTIME-INR
INR: 1.08 (ref 0.00–1.49)
Prothrombin Time: 13.9 seconds (ref 11.6–15.2)

## 2010-04-17 LAB — SURGICAL PCR SCREEN
MRSA, PCR: NEGATIVE
Staphylococcus aureus: NEGATIVE

## 2010-04-17 LAB — GLUCOSE, CAPILLARY: Glucose-Capillary: 96 mg/dL (ref 70–99)

## 2010-04-17 LAB — APTT: aPTT: 31 seconds (ref 24–37)

## 2010-04-18 LAB — CBC
HCT: 26.4 % — ABNORMAL LOW (ref 36.0–46.0)
Hemoglobin: 8.9 g/dL — ABNORMAL LOW (ref 12.0–15.0)
RBC: 2.96 MIL/uL — ABNORMAL LOW (ref 3.87–5.11)
RDW: 19.8 % — ABNORMAL HIGH (ref 11.5–15.5)

## 2010-04-18 LAB — APTT: aPTT: 31 seconds (ref 24–37)

## 2010-04-19 LAB — COMPREHENSIVE METABOLIC PANEL
ALT: 9 U/L (ref 0–35)
AST: 24 U/L (ref 0–37)
Albumin: 3 g/dL — ABNORMAL LOW (ref 3.5–5.2)
CO2: 21 mEq/L (ref 19–32)
Chloride: 99 mEq/L (ref 96–112)
GFR calc Af Amer: 59 mL/min — ABNORMAL LOW (ref >60)
GFR calc non Af Amer: 49 mL/min — ABNORMAL LOW (ref >60)
Potassium: 4.7 mEq/L (ref 3.5–5.1)
Sodium: 134 mEq/L — ABNORMAL LOW (ref 135–145)
Total Bilirubin: 0.6 mg/dL (ref 0.3–1.2)

## 2010-04-19 LAB — BASIC METABOLIC PANEL
CO2: 22 mEq/L (ref 19–32)
Calcium: 8.9 mg/dL (ref 8.4–10.5)
Chloride: 101 mEq/L (ref 96–112)
GFR calc Af Amer: >60 mL/min (ref >60)
Potassium: 4.7 mEq/L (ref 3.5–5.1)
Sodium: 133 mEq/L — ABNORMAL LOW (ref 135–145)

## 2010-04-19 LAB — CBC
HCT: 23.9 % — ABNORMAL LOW (ref 36.0–46.0)
Hemoglobin: 8 g/dL — ABNORMAL LOW (ref 12.0–15.0)
Hemoglobin: 6.2 g/dL — CL (ref 12.0–15.0)
MCHC: 33.2 g/dL (ref 30.0–36.0)
Platelets: 401 10*3/uL — ABNORMAL HIGH (ref 150–400)
RBC: 2.71 MIL/uL — ABNORMAL LOW (ref 3.87–5.11)
RBC: 2.11 MIL/uL — ABNORMAL LOW (ref 3.87–5.11)
RBC: 2.86 MIL/uL — ABNORMAL LOW (ref 3.87–5.11)
RDW: 16.2 % — ABNORMAL HIGH (ref 11.5–15.5)
WBC: 7.4 10*3/uL (ref 4.0–10.5)
WBC: 9.2 10*3/uL (ref 4.0–10.5)

## 2010-04-19 LAB — URINALYSIS, ROUTINE W REFLEX MICROSCOPIC
Glucose, UA: NEGATIVE mg/dL
Ketones, ur: 15 mg/dL — AB
Nitrite: NEGATIVE
Specific Gravity, Urine: 1.022 (ref 1.005–1.030)
pH: 5 (ref 5.0–8.0)

## 2010-04-19 LAB — GLUCOSE, CAPILLARY
Glucose-Capillary: 107 mg/dL — ABNORMAL HIGH (ref 70–99)
Glucose-Capillary: 139 mg/dL — ABNORMAL HIGH (ref 70–99)
Glucose-Capillary: 187 mg/dL — ABNORMAL HIGH (ref 70–99)
Glucose-Capillary: 91 mg/dL (ref 70–99)

## 2010-04-19 LAB — TYPE AND SCREEN
ABO/RH(D): O NEG
Antibody Screen: NEGATIVE

## 2010-04-19 LAB — URINE MICROSCOPIC-ADD ON

## 2010-04-19 LAB — HEMOGLOBIN AND HEMATOCRIT, BLOOD: HCT: 19.5 % — ABNORMAL LOW (ref 36.0–46.0)

## 2010-04-19 LAB — PREPARE RBC (CROSSMATCH)

## 2010-04-19 LAB — MRSA PCR SCREENING: MRSA by PCR: NEGATIVE

## 2010-04-19 LAB — APTT: aPTT: 38 seconds — ABNORMAL HIGH (ref 24–37)

## 2010-04-22 ENCOUNTER — Ambulatory Visit: Payer: Medicare Other | Attending: Radiation Oncology | Admitting: Radiation Oncology

## 2010-04-22 DIAGNOSIS — C50919 Malignant neoplasm of unspecified site of unspecified female breast: Secondary | ICD-10-CM | POA: Insufficient documentation

## 2010-04-22 DIAGNOSIS — C7952 Secondary malignant neoplasm of bone marrow: Secondary | ICD-10-CM | POA: Insufficient documentation

## 2010-04-22 DIAGNOSIS — C7951 Secondary malignant neoplasm of bone: Secondary | ICD-10-CM | POA: Insufficient documentation

## 2010-05-03 ENCOUNTER — Encounter (HOSPITAL_BASED_OUTPATIENT_CLINIC_OR_DEPARTMENT_OTHER): Payer: MEDICARE | Admitting: Oncology

## 2010-05-03 ENCOUNTER — Other Ambulatory Visit: Payer: Self-pay | Admitting: Oncology

## 2010-05-03 DIAGNOSIS — C7952 Secondary malignant neoplasm of bone marrow: Secondary | ICD-10-CM

## 2010-05-03 DIAGNOSIS — C50419 Malignant neoplasm of upper-outer quadrant of unspecified female breast: Secondary | ICD-10-CM

## 2010-05-03 DIAGNOSIS — C7951 Secondary malignant neoplasm of bone: Secondary | ICD-10-CM

## 2010-05-03 DIAGNOSIS — E538 Deficiency of other specified B group vitamins: Secondary | ICD-10-CM

## 2010-05-03 LAB — CBC WITH DIFFERENTIAL/PLATELET
BASO%: 0.5 % (ref 0.0–2.0)
EOS%: 4.3 % (ref 0.0–7.0)
HCT: 30 % — ABNORMAL LOW (ref 34.8–46.6)
LYMPH%: 30.7 % (ref 14.0–49.7)
MCH: 30.5 pg (ref 25.1–34.0)
MCHC: 33.7 g/dL (ref 31.5–36.0)
MONO%: 6.7 % (ref 0.0–14.0)
NEUT%: 57.8 % (ref 38.4–76.8)
Platelets: 215 10*3/uL (ref 145–400)
lymph#: 2.3 10*3/uL (ref 0.9–3.3)

## 2010-05-03 LAB — COMPREHENSIVE METABOLIC PANEL
ALT: 17 U/L (ref 0–35)
AST: 15 U/L (ref 0–37)
Alkaline Phosphatase: 82 U/L (ref 39–117)
Creatinine, Ser: 1.13 mg/dL (ref 0.40–1.20)
Total Bilirubin: 0.3 mg/dL (ref 0.3–1.2)

## 2010-06-02 ENCOUNTER — Telehealth: Payer: Self-pay | Admitting: Internal Medicine

## 2010-06-02 DIAGNOSIS — E039 Hypothyroidism, unspecified: Secondary | ICD-10-CM

## 2010-06-02 DIAGNOSIS — E119 Type 2 diabetes mellitus without complications: Secondary | ICD-10-CM

## 2010-06-02 MED ORDER — LEVOTHYROXINE SODIUM 150 MCG PO TABS: 150.0000 ug | ORAL_TABLET | Freq: Every day | ORAL | Status: DC

## 2010-06-02 MED ORDER — INSULIN GLARGINE 100 UNIT/ML ~~LOC~~ SOLN: 25.0000 [IU] | Freq: Every day | SUBCUTANEOUS | Status: DC

## 2010-06-02 NOTE — Telephone Encounter (Signed)
Ok to refill x 3 

## 2010-06-02 NOTE — Telephone Encounter (Signed)
Refill- levothyroxine sodium tab. Take one tablet by mouth every day. Qty 30. Last fill 4.4.12  Refill- lantus 100u/ml inj ml. Inject 25-30 units subcutaneously every day. Qty 50. Last fill 3.17.12

## 2010-06-02 NOTE — Telephone Encounter (Signed)
Rx refill sent to pharmacy. 

## 2010-06-03 ENCOUNTER — Encounter (HOSPITAL_BASED_OUTPATIENT_CLINIC_OR_DEPARTMENT_OTHER): Payer: MEDICARE | Admitting: Oncology

## 2010-06-03 ENCOUNTER — Other Ambulatory Visit: Payer: Self-pay | Admitting: Oncology

## 2010-06-03 DIAGNOSIS — C7951 Secondary malignant neoplasm of bone: Secondary | ICD-10-CM

## 2010-06-03 DIAGNOSIS — E538 Deficiency of other specified B group vitamins: Secondary | ICD-10-CM

## 2010-06-03 DIAGNOSIS — C7952 Secondary malignant neoplasm of bone marrow: Secondary | ICD-10-CM

## 2010-06-03 DIAGNOSIS — C50419 Malignant neoplasm of upper-outer quadrant of unspecified female breast: Secondary | ICD-10-CM

## 2010-06-03 LAB — CBC WITH DIFFERENTIAL/PLATELET
Basophils Absolute: 0.1 10*3/uL (ref 0.0–0.1)
EOS%: 3.4 % (ref 0.0–7.0)
Eosinophils Absolute: 0.3 10*3/uL (ref 0.0–0.5)
HCT: 31.7 % — ABNORMAL LOW (ref 34.8–46.6)
HGB: 10.3 g/dL — ABNORMAL LOW (ref 11.6–15.9)
MCH: 29.3 pg (ref 25.1–34.0)
MCV: 90.1 fL (ref 79.5–101.0)
MONO%: 6.6 % (ref 0.0–14.0)
NEUT%: 51.6 % (ref 38.4–76.8)

## 2010-06-03 LAB — CANCER ANTIGEN 27.29: CA 27.29: 87 U/mL — ABNORMAL HIGH (ref 0–39)

## 2010-06-03 LAB — COMPREHENSIVE METABOLIC PANEL
Albumin: 4.5 g/dL (ref 3.5–5.2)
Alkaline Phosphatase: 66 U/L (ref 39–117)
BUN: 32 mg/dL — ABNORMAL HIGH (ref 6–23)
CO2: 18 mEq/L — ABNORMAL LOW (ref 19–32)
Glucose, Bld: 137 mg/dL — ABNORMAL HIGH (ref 70–99)
Potassium: 4.8 mEq/L (ref 3.5–5.3)

## 2010-06-14 NOTE — Procedures (Signed)
CAROTID DUPLEX EXAM   INDICATION:  Follow up right CEA with known left carotid disease.   HISTORY:  Diabetes:  Yes.  Cardiac:  No.  Hypertension:  Yes.  Smoking:  Previous.  Previous Surgery:  CEA 05/28/2009 by Dr. Myra Gianotti.  CV History:  Asymptomatic.  Amaurosis Fugax No, Paresthesias No, Hemiparesis No.                                       RIGHT             LEFT  Brachial systolic pressure:  Brachial Doppler waveforms:  Vertebral direction of flow:                          Antegrade  DUPLEX VELOCITIES (cm/sec)  CCA peak systolic                                     118  ECA peak systolic                                     261  ICA peak systolic                                     431  ICA end diastolic                                     114  PLAQUE MORPHOLOGY:                                    Calcified  PLAQUE AMOUNT:                                        Severe  PLAQUE LOCATION:                                      ICA/ECA   IMPRESSION:  1. Left-side only study per Dr. Myra Gianotti to evaluate possible changes      status post right carotid endarterectomy.  2. Left internal carotid artery shows evidence of 80% to 99% stenosis.  3. Left external carotid artery stenosis.   ___________________________________________  V. Charlena Cross, MD   AS/MEDQ  D:  06/21/2009  T:  06/21/2009  Job:  [id number]

## 2010-06-14 NOTE — Procedures (Signed)
CAROTID DUPLEX EXAM   INDICATION:  Bilateral carotid bruit.   HISTORY:  Diabetes:  Yes.  Cardiac:  No.  Hypertension:  Yes.  Smoking:  Quit about 20 years ago.  Previous Surgery:  No.  CV History:  No.  Amaurosis Fugax No, Paresthesias No, Hemiparesis No                                       RIGHT             LEFT  Brachial systolic pressure:         140               180  Brachial Doppler waveforms:         Biphasic          Biphasic  Vertebral direction of flow:        Resistive         Antegrade  DUPLEX VELOCITIES (cm/sec)  CCA peak systolic                   113               109  ECA peak systolic                   400               217  ICA peak systolic                   595               403  ICA end diastolic                   144               118  PLAQUE MORPHOLOGY:                  Calcified         Calcified  PLAQUE AMOUNT:                      Severe            Severe  PLAQUE LOCATION:                    ICA and ECA       ICA and ECA   IMPRESSION:  1. 80%-99% stenosis noted in the right internal carotid artery.  2. Approximately 80%-99% stenosis noted in the left internal carotid      artery.  3. Antegrade left vertebral arteries.   ___________________________________________  Larina Earthly, M.D.   MG/MEDQ  D:  05/14/2009  T:  05/14/2009  Job:  [id number]

## 2010-06-14 NOTE — Assessment & Plan Note (Signed)
OFFICE VISIT   BRIELLE, MORO  DOB:  04-28-1940                                       06/21/2009  CHART#:[id number]   This 70 year old female with bilateral carotid stenosis, right greater  than left.  She underwent a right carotid endarterectomy on 05/28/2009  for asymptomatic disease.  She did require a blood transfusion for  unknown etiology after the procedure.   When I saw her in the office on 06/07/2009, she was overly fatigued.  I  sent off multiple labs.  The only thing that came back abnormal was her  thyroid panel.  She saw Dr. Artist Pais the following Monday, who adjusted some  of her medicines and sent her for a mammogram, which came back positive.  A biopsy has revealed breast cancer.   On examination, she continues to have a marginal mandibular neurapraxia.  Her incision is well healed.  She is neurologically intact.   DIAGNOSTIC STUDIES:  Her ultrasound was repeated today on the left.  This shows approximately 80% stenosis.   In light of the patient's new diagnosis of breast cancer, I do not feel  that proceeding with management of her left carotid stenosis at this  time would be the most appropriate course of action.  I told the patient  that she is at slightly increased risk for stroke but with what all she  is up against at this point in time, I think we should delay  intervention on her left carotid.  I plan on seeing her back in 6  months.  We will repeat the ultrasound on both sides at that time.  Both  her son and patient are in total agreement with this plan.     Jorge Ny, MD  Electronically Signed   VWB/MEDQ  D:  06/21/2009  T:  06/22/2009  Job:  [id number]   cc:   Barbette Hair. Artist Pais, DO

## 2010-06-14 NOTE — Assessment & Plan Note (Signed)
OFFICE VISIT   Brittney Cunningham, Brittney Cunningham  DOB:  January 06, 1941                                       05/17/2009  ZOXWR#:[id number]   REASON FOR VISIT:  Bilateral carotid stenosis.   HISTORY:  The patient is a 70 year old female who is seen at the request  of [name] for evaluation of carotid disease.  The patient was  recently seen for shortness of breath.  In her workup, carotid bruits  were heard bilaterally and a carotid ultrasound was obtained that showed  bilateral stenosis.  She was sent to me for further evaluation.  The  patient denies symptoms.  Specifically, she denies numbness or weakness  in either extremity.  She denies slurring of her speech or trouble with  word-finding.  She denies amaurosis fugax.   The patient suffers from diabetes, was diagnosed initially at age of 4.  This has been medically managed.  She also suffers from hypertension  that is medically managed.  She has a history smoking but quit 46 years  ago.   REVIEW OF SYSTEMS:  GENERAL:  Negative for fevers, chills, weight gain,  weight loss.  CARDIAC:  Positive for chest tightness and pressure, palpitations,  shortness of breath with exertion.  PULMONARY:  Negative.  GI:  Positive for reflux disease.  GU:  Negative.  VASCULAR:  Positive pain in her legs when walking and when lying flat.  NEUROLOGIC:  Negative.  MUSCULOSKELETAL:  Positive for arthritis, joint pain, muscle pain.  PSYCH:  Negative.  EENT:  Recent change in eyesight.  HEMATOLOGIC:  Negative.  SKIN:  Negative.   PAST MEDICAL HISTORY:  Diabetes, hypertension, thyroid disease.   PAST SURGICAL HISTORY:  C-section x3, knee surgery, ganglion cyst  removal, breast surgery.   FAMILY HISTORY:  Negative for cardiovascular disease at an early age.   SOCIAL HISTORY:  She has 3 children.  Does not smoke.  Has a history  smoking, quit 46 years ago.  Does not drink alcohol.   ALLERGIES:  To codeine.   PHYSICAL EXAMINATION:   Heart rate 106, blood pressure 142/80 in the  right, 157/82 in the left, respiratory rate is 20.  Generally, she is  well-appearing, in no distress.  HEENT:  Within normal limits.  Lungs  are clear bilaterally.  Cardiovascular is a regular rate.  She has  bilateral carotid bruits.  Abdomen is obese but soft.  Musculoskeletal  is without major deformities.  Neuro:  She is no focal weaknesses or  deficits.  Skin is without rash.   DIAGNOSTIC STUDIES:  Carotid duplex was performed today.  This reveals  bilateral high-grade, 80%-99% carotid stenosis with bilateral external  carotid disease.  Vertebral artery flow was antegrade.  The right side  is more severe than the left with peak systolic velocities to 595 and  diastolics at 144.   ASSESSMENT:  Asymptomatic bilateral carotid stenosis.   PLAN:  The patient has been scheduled for a right carotid  endarterectomy.  The risks and benefits of proceeding with the operation  were discussed with the patient including the risk of heart attack,  nerve injury and stroke as well as infection and bleeding.  She  understands all these risks and wishes to proceed.  We also discussed  the pros and cons of medical therapy and carotid stenting.  Her  operation has been  scheduled for Friday, April 29.  The operation is  pending on cardiac evaluation, which is to be completed by today by Dr.  Swaziland.     Jorge Ny, MD  Electronically Signed   VWB/MEDQ  D:  05/17/2009  T:  05/18/2009  Job:  [id number]   cc:   Dr. Burnette  Peter M. Swaziland, M.D.

## 2010-06-14 NOTE — Assessment & Plan Note (Signed)
OFFICE VISIT   Brittney Cunningham, Brittney Cunningham  DOB:  [birth year]                                       06/07/2009  ZOXWR#:[id number]   REASON FOR VISIT:  Follow-up carotid endarterectomy.   HISTORY:  This is a 70 year old female who I saw for bilateral high-  grade carotid stenosis, right greater than left.  She underwent right  carotid endarterectomy on May 28, 2009 for asymptomatic disease.  Her  postoperative course was complicated by the need for blood transfusion,  the etiology of her decrease in blood counts is unclear at this time.  She comes back in today for her first postoperative visit, she is  complaining of severe fatigue and occasional shortness of breath.  She  does not endorse any neurologic complications.  There is a slight  marginal mandibular neurapraxia, which she is tolerating.  Her incision  is well-healed.   At this point I am going to send her for blood work to see if we can  determine the etiology of her fatigue, and is certainly normal to have  some fatigue postoperative, however she seems to be a little on the  excessive side.  I would like to see what her blood counts are.  If her  hemoglobin is down again I think she will need to undergo a full workup  for this.  I am also sending a thyroid panel, she has recently had her  thyroid medication adjusted.  I will also get a chest x-ray to help with  shortness of breath.  I have encouraged her to use her incentive  spirometry.  I will call her with the results of her test this  afternoon.  She is scheduled to see Dr. Artist Pais for the first time next  Monday.  We will need to forward this information to him.   The patient does have bilateral carotid disease, the left side will need  to be addressed.  However, the acute issues need to be resolved first.  I am going to bring her back in 2 weeks and we will repeat carotid  ultrasound and make recommendations for the left carotid artery based on  the  findings of the ultrasound.     Jorge Ny, MD  Electronically Signed   VWB/MEDQ  D:  06/07/2009  T:  06/08/2009  Job:  [id number]   cc:   Peter M. Swaziland, M.D.  [place] Dr. Artist Pais

## 2010-06-14 NOTE — Assessment & Plan Note (Signed)
OFFICE VISIT   Brittney, Cunningham  DOB:  Dec 03, 1940                                       02/14/2010  WUJWJ#:[id number]   REASON FOR VISIT:  Carotid stenosis.   HISTORY:  This is a 70 year old female who I performed a right carotid  endarterectomy on May 28, 2009, for asymptomatic disease.  She has had  a known greater 80% stenosis on the contralateral side, the left side;  however, when we were entertaining fixing this, she was diagnosed with  metastatic breast cancer.  She had been undergoing treatment for this.  It has metastasized to the bone.  She is scheduled to have her  mastectomy with Dr. Dwain Sarna on February 2.   I spent approximately 30 minutes reviewing the patient's records and  discussing this with her, with regards to priorities at which she  receives her treatment.  She specifically denied having any symptoms  related to her carotid disease.  No numbness or weakness, slurred  speech, amaurosis fugax.  For that reason, I think that we should  proceed with her mastectomy to in continuation with her breast cancer  therapy.  With a known greater than 80% stenosis on the right, this does  make her at slightly higher risk for stroke during her operation;  however, I still think this risk is minimal.  The patient understands  this.  We also discussed that medical management of her carotid disease  is probably best until we have a good prognosis of life expectancy based  on the final treatments of her disease.  I will plan on seeing her back  in 3 months and we will discuss carotid intervention at that time.     Jorge Ny, MD  Electronically Signed   VWB/MEDQ  D:  02/14/2010  T:  02/15/2010  Job:  [id number]   cc:   Juanetta Gosling, MD

## 2010-07-01 ENCOUNTER — Other Ambulatory Visit: Payer: Self-pay | Admitting: Oncology

## 2010-07-01 ENCOUNTER — Encounter (HOSPITAL_BASED_OUTPATIENT_CLINIC_OR_DEPARTMENT_OTHER): Payer: Medicare Other | Admitting: Oncology

## 2010-07-01 DIAGNOSIS — Z853 Personal history of malignant neoplasm of breast: Secondary | ICD-10-CM

## 2010-07-01 DIAGNOSIS — E538 Deficiency of other specified B group vitamins: Secondary | ICD-10-CM

## 2010-07-01 DIAGNOSIS — C7951 Secondary malignant neoplasm of bone: Secondary | ICD-10-CM

## 2010-07-01 DIAGNOSIS — C50419 Malignant neoplasm of upper-outer quadrant of unspecified female breast: Secondary | ICD-10-CM

## 2010-07-01 LAB — COMPREHENSIVE METABOLIC PANEL
ALT: 13 U/L (ref 0–35)
Albumin: 4.2 g/dL (ref 3.5–5.2)
CO2: 24 mEq/L (ref 19–32)
Calcium: 9.4 mg/dL (ref 8.4–10.5)
Chloride: 105 mEq/L (ref 96–112)
Glucose, Bld: 154 mg/dL — ABNORMAL HIGH (ref 70–99)
Potassium: 4.9 mEq/L (ref 3.5–5.3)
Sodium: 137 mEq/L (ref 135–145)
Total Protein: 7 g/dL (ref 6.0–8.3)

## 2010-07-01 LAB — CBC WITH DIFFERENTIAL/PLATELET
BASO%: 0.7 % (ref 0.0–2.0)
Eosinophils Absolute: 0.3 10*3/uL (ref 0.0–0.5)
LYMPH%: 34.6 % (ref 14.0–49.7)
MONO#: 0.7 10*3/uL (ref 0.1–0.9)
NEUT#: 4.3 10*3/uL (ref 1.5–6.5)
Platelets: 269 10*3/uL (ref 145–400)
RBC: 3.59 10*6/uL — ABNORMAL LOW (ref 3.70–5.45)
RDW: 14.3 % (ref 11.2–14.5)
WBC: 8.2 10*3/uL (ref 3.9–10.3)
lymph#: 2.9 10*3/uL (ref 0.9–3.3)

## 2010-07-04 ENCOUNTER — Other Ambulatory Visit: Payer: Self-pay | Admitting: *Deleted

## 2010-07-04 ENCOUNTER — Telehealth: Payer: Self-pay | Admitting: Internal Medicine

## 2010-07-04 MED ORDER — CARVEDILOL 12.5 MG PO TABS: 12.5000 mg | ORAL_TABLET | Freq: Two times a day (BID) | ORAL | Status: DC

## 2010-07-04 NOTE — Telephone Encounter (Signed)
Refill- simvastatin 20mg  tab. Take one tablet by mouth every day. Qty 30. Last fill 5.2.12

## 2010-07-04 NOTE — Telephone Encounter (Signed)
escribe medication per fax request  

## 2010-07-05 ENCOUNTER — Encounter: Payer: Self-pay | Admitting: Internal Medicine

## 2010-07-05 MED ORDER — SIMVASTATIN 20 MG PO TABS: 20.0000 mg | ORAL_TABLET | Freq: Every day | ORAL | Status: DC

## 2010-07-05 NOTE — Telephone Encounter (Signed)
Rx refill sent to pharmacy. Patient past due for follow up appointment

## 2010-07-11 ENCOUNTER — Encounter (INDEPENDENT_AMBULATORY_CARE_PROVIDER_SITE_OTHER): Payer: Self-pay | Admitting: General Surgery

## 2010-07-13 ENCOUNTER — Ambulatory Visit: Payer: Medicare Other | Admitting: Radiation Oncology

## 2010-07-27 ENCOUNTER — Ambulatory Visit: Admission: RE | Admit: 2010-07-27 | Payer: Medicare Other | Source: Ambulatory Visit

## 2010-07-27 ENCOUNTER — Ambulatory Visit
Admission: RE | Admit: 2010-07-27 | Discharge: 2010-07-27 | Disposition: A | Discharge status: Home / Self Care | Payer: Medicare Other | Source: Ambulatory Visit | Attending: Radiation Oncology | Admitting: Radiation Oncology

## 2010-07-27 DIAGNOSIS — L989 Disorder of the skin and subcutaneous tissue, unspecified: Secondary | ICD-10-CM | POA: Insufficient documentation

## 2010-07-27 DIAGNOSIS — Y842 Radiological procedure and radiotherapy as the cause of abnormal reaction of the patient, or of later complication, without mention of misadventure at the time of the procedure: Secondary | ICD-10-CM | POA: Insufficient documentation

## 2010-07-27 DIAGNOSIS — C50919 Malignant neoplasm of unspecified site of unspecified female breast: Secondary | ICD-10-CM | POA: Insufficient documentation

## 2010-07-27 DIAGNOSIS — Z510 Encounter for antineoplastic radiation therapy: Secondary | ICD-10-CM | POA: Insufficient documentation

## 2010-07-29 ENCOUNTER — Other Ambulatory Visit: Payer: Self-pay | Admitting: Oncology

## 2010-07-29 ENCOUNTER — Encounter (HOSPITAL_BASED_OUTPATIENT_CLINIC_OR_DEPARTMENT_OTHER): Payer: Medicare Other | Admitting: Oncology

## 2010-07-29 DIAGNOSIS — E538 Deficiency of other specified B group vitamins: Secondary | ICD-10-CM

## 2010-07-29 DIAGNOSIS — C7952 Secondary malignant neoplasm of bone marrow: Secondary | ICD-10-CM

## 2010-07-29 DIAGNOSIS — C7951 Secondary malignant neoplasm of bone: Secondary | ICD-10-CM

## 2010-07-29 LAB — BASIC METABOLIC PANEL
BUN: 40 mg/dL — ABNORMAL HIGH (ref 6–23)
CO2: 20 mEq/L (ref 19–32)
Calcium: 9.3 mg/dL (ref 8.4–10.5)
Chloride: 103 mEq/L (ref 96–112)
Creatinine, Ser: 1.6 mg/dL — ABNORMAL HIGH (ref 0.50–1.10)
Glucose, Bld: 363 mg/dL — ABNORMAL HIGH (ref 70–99)
Potassium: 5.2 mEq/L (ref 3.5–5.3)
Sodium: 133 mEq/L — ABNORMAL LOW (ref 135–145)

## 2010-08-04 ENCOUNTER — Ambulatory Visit: Payer: Medicare Other | Admitting: Cardiology

## 2010-08-08 ENCOUNTER — Other Ambulatory Visit: Payer: MEDICARE

## 2010-08-08 ENCOUNTER — Ambulatory Visit: Payer: MEDICARE | Admitting: Surgery

## 2010-08-15 ENCOUNTER — Other Ambulatory Visit: Payer: Self-pay | Admitting: *Deleted

## 2010-08-15 MED ORDER — CARVEDILOL 12.5 MG PO TABS: 12.5000 mg | ORAL_TABLET | Freq: Two times a day (BID) | ORAL | Status: DC

## 2010-08-24 ENCOUNTER — Other Ambulatory Visit: Payer: Self-pay | Admitting: *Deleted

## 2010-08-24 NOTE — Telephone Encounter (Signed)
Ok to fill either 7 day or one month supply

## 2010-08-24 NOTE — Telephone Encounter (Signed)
Dell from [facility] called requesting a rx for Benicar 40-12.5 on behalf of patient.He states she is requesting a 7 day supply until she is seen for her appointment. Last office visit was January 2012. Patient was advised to schedule 2 month follow up at that time and has not been seen for evaluation since then.

## 2010-08-25 MED ORDER — OLMESARTAN MEDOXOMIL-HCTZ 40-12.5 MG PO TABS: 1.0000 | ORAL_TABLET | Freq: Every day | ORAL | Status: DC

## 2010-08-25 NOTE — Telephone Encounter (Signed)
Rx refill sent to pharmacy for a 30 day supply

## 2010-08-26 ENCOUNTER — Other Ambulatory Visit: Payer: Self-pay | Admitting: Oncology

## 2010-08-26 ENCOUNTER — Encounter (HOSPITAL_BASED_OUTPATIENT_CLINIC_OR_DEPARTMENT_OTHER): Payer: Medicare Other | Admitting: Oncology

## 2010-08-26 DIAGNOSIS — C7951 Secondary malignant neoplasm of bone: Secondary | ICD-10-CM

## 2010-08-26 DIAGNOSIS — E538 Deficiency of other specified B group vitamins: Secondary | ICD-10-CM

## 2010-08-26 DIAGNOSIS — C50419 Malignant neoplasm of upper-outer quadrant of unspecified female breast: Secondary | ICD-10-CM

## 2010-08-26 LAB — BASIC METABOLIC PANEL
CO2: 23 mEq/L (ref 19–32)
Glucose, Bld: 348 mg/dL — ABNORMAL HIGH (ref 70–99)
Potassium: 4.5 mEq/L (ref 3.5–5.3)
Sodium: 135 mEq/L (ref 135–145)

## 2010-08-30 ENCOUNTER — Encounter: Payer: Self-pay | Admitting: Internal Medicine

## 2010-08-30 ENCOUNTER — Ambulatory Visit (INDEPENDENT_AMBULATORY_CARE_PROVIDER_SITE_OTHER): Payer: Medicare Other | Admitting: Internal Medicine

## 2010-08-30 DIAGNOSIS — E785 Hyperlipidemia, unspecified: Secondary | ICD-10-CM

## 2010-08-30 DIAGNOSIS — I10 Essential (primary) hypertension: Secondary | ICD-10-CM

## 2010-08-30 DIAGNOSIS — E119 Type 2 diabetes mellitus without complications: Secondary | ICD-10-CM

## 2010-08-30 DIAGNOSIS — E039 Hypothyroidism, unspecified: Secondary | ICD-10-CM

## 2010-08-30 LAB — HEMOGLOBIN A1C
Hgb A1c MFr Bld: 9.3 % — ABNORMAL HIGH (ref <5.7)
Mean Plasma Glucose: 220 mg/dL — ABNORMAL HIGH (ref <117)

## 2010-08-30 LAB — LIPID PANEL
LDL Cholesterol: 121 mg/dL — ABNORMAL HIGH (ref 0–99)
Triglycerides: 358 mg/dL — ABNORMAL HIGH (ref <150)
VLDL: 72 mg/dL — ABNORMAL HIGH (ref 0–40)

## 2010-08-30 MED ORDER — HYDROCHLOROTHIAZIDE 12.5 MG PO CAPS: 12.5000 mg | ORAL_CAPSULE | ORAL | Status: DC

## 2010-08-30 MED ORDER — INSULIN GLARGINE 100 UNIT/ML ~~LOC~~ SOLN: 45.0000 [IU] | Freq: Every day | SUBCUTANEOUS | Status: DC

## 2010-08-30 MED ORDER — LOSARTAN POTASSIUM 50 MG PO TABS: 50.0000 mg | ORAL_TABLET | Freq: Every day | ORAL | Status: DC

## 2010-08-30 NOTE — Assessment & Plan Note (Signed)
Change benicar hct to losartan and hctz for cost consideration. Monitor bp closely and followup as scheduled.

## 2010-08-30 NOTE — Assessment & Plan Note (Signed)
Suboptimal control. Increase lantus 45 units then instructions provided for 3 units q 3 day increase until fasting am sugar consistently less than 130 without hypoglycemia. Obtain a1c. DC metformin due to elevated creatinine. Close followup scheduled.

## 2010-08-30 NOTE — Progress Notes (Signed)
  Subjective:    Patient ID: Brittney Cunningham, female    DOB: 1940-07-06, 70 y.o.   MRN: [id number]  HPI Pt presents to clinic for evaluation of multiple medical problems. FSBS 120-199 without hypoglycemia. Using lantus 40 units and metformin. Last two creatinines >1.5. Undergoing radx for breast cancer. BP under good control but finds benicar hct expensive. Tolerates statin tx without myalgias or abn lfts. States s/p right cea last year with ongoing surveillance. No other complaints.  Reviewed pmh, medications and allergies   Review of Systems see hpi    Objective:   Physical Exam  Nursing note and vitals reviewed. Constitutional: She appears well-developed. No distress.  HENT:  Head: Normocephalic and atraumatic.  Eyes: Conjunctivae are normal. No scleral icterus.  Neck: Neck supple. No JVD present. Carotid bruit is not present.  Cardiovascular: Normal rate, regular rhythm and normal heart sounds.  Exam reveals no gallop and no friction rub.   No murmur heard. Pulmonary/Chest: Effort normal and breath sounds normal. No respiratory distress. She has no wheezes. She has no rales.  Neurological: She is alert.  Skin: Skin is warm and dry. She is not diaphoretic.  Psychiatric: She has a normal mood and affect.          Assessment & Plan:   No problem-specific assessment & plan notes found for this encounter.

## 2010-08-30 NOTE — Patient Instructions (Signed)
Please increase lantus to 46 units at nighttime. After 3 days begin to increase lantus dose 3 units every 3 days until fasting morning sugars are less than 130 without lows. Please stop metformin due to your kidney test being elevated.

## 2010-08-30 NOTE — Assessment & Plan Note (Signed)
Stable on statin tx. Obtain lipid.

## 2010-08-30 NOTE — Assessment & Plan Note (Signed)
Obtain tsh  

## 2010-09-01 LAB — T4, FREE: Free T4: 1.77 NG/DL (ref 0.80–1.70)

## 2010-09-05 ENCOUNTER — Telehealth: Payer: Self-pay | Admitting: Internal Medicine

## 2010-09-05 NOTE — Telephone Encounter (Signed)
Refill-simvastatin 20mg  tab. Take one tablet by mouth every day. Qty 30. Last fill 7.3.12

## 2010-09-06 ENCOUNTER — Telehealth: Payer: Self-pay | Admitting: Internal Medicine

## 2010-09-06 MED ORDER — SIMVASTATIN 20 MG PO TABS: 20.0000 mg | ORAL_TABLET | Freq: Every day | ORAL | Status: DC

## 2010-09-06 MED ORDER — SIMVASTATIN 40 MG PO TABS: 40.0000 mg | ORAL_TABLET | Freq: Every day | ORAL | Status: DC

## 2010-09-06 NOTE — Telephone Encounter (Signed)
Call placed [phone], she has verified that she is to take 40 mg due to elevation in her Lipid Panel. New Rx for 40 mg dose sent to [facility]. Patient has been advised

## 2010-09-06 NOTE — Telephone Encounter (Signed)
Simvastatin 20mg  tab. Take one tablet by mouth every day.  Pharmacy comments: has there been a dose change? Patient claims dosage change, need new  rx.

## 2010-09-06 NOTE — Telephone Encounter (Signed)
Rx refill sent to pharmacy. 

## 2010-09-21 ENCOUNTER — Other Ambulatory Visit: Payer: Self-pay | Admitting: Physician Assistant

## 2010-09-21 ENCOUNTER — Encounter (HOSPITAL_BASED_OUTPATIENT_CLINIC_OR_DEPARTMENT_OTHER): Payer: Medicare Other | Admitting: Oncology

## 2010-09-21 DIAGNOSIS — C7952 Secondary malignant neoplasm of bone marrow: Secondary | ICD-10-CM

## 2010-09-21 DIAGNOSIS — C50419 Malignant neoplasm of upper-outer quadrant of unspecified female breast: Secondary | ICD-10-CM

## 2010-09-21 DIAGNOSIS — E538 Deficiency of other specified B group vitamins: Secondary | ICD-10-CM

## 2010-09-21 LAB — COMPREHENSIVE METABOLIC PANEL
ALT: 12 U/L (ref 0–35)
AST: 11 U/L (ref 0–37)
CO2: 25 mEq/L (ref 19–32)
Calcium: 9.4 mg/dL (ref 8.4–10.5)
Chloride: 99 mEq/L (ref 96–112)
Creatinine, Ser: 1.37 mg/dL — ABNORMAL HIGH (ref 0.50–1.10)
Potassium: 4.2 mEq/L (ref 3.5–5.3)
Sodium: 135 mEq/L (ref 135–145)
Total Protein: 7.3 g/dL (ref 6.0–8.3)

## 2010-09-21 LAB — CBC WITH DIFFERENTIAL/PLATELET
BASO%: 0.6 % (ref 0.0–2.0)
EOS%: 3.4 % (ref 0.0–7.0)
HCT: 28.1 % — ABNORMAL LOW (ref 34.8–46.6)
MCH: 30.8 pg (ref 25.1–34.0)
MCHC: 34.3 g/dL (ref 31.5–36.0)
MONO#: 0.5 10*3/uL (ref 0.1–0.9)
NEUT%: 72.5 % (ref 38.4–76.8)
RBC: 3.13 10*6/uL — ABNORMAL LOW (ref 3.70–5.45)
RDW: 14.6 % — ABNORMAL HIGH (ref 11.2–14.5)
WBC: 5.6 10*3/uL (ref 3.9–10.3)
lymph#: 0.8 10*3/uL — ABNORMAL LOW (ref 0.9–3.3)

## 2010-09-22 ENCOUNTER — Ambulatory Visit: Payer: Medicare Other | Admitting: Internal Medicine

## 2010-09-23 ENCOUNTER — Encounter (HOSPITAL_BASED_OUTPATIENT_CLINIC_OR_DEPARTMENT_OTHER): Payer: Medicare Other | Admitting: Oncology

## 2010-09-23 DIAGNOSIS — C7951 Secondary malignant neoplasm of bone: Secondary | ICD-10-CM

## 2010-09-23 DIAGNOSIS — E538 Deficiency of other specified B group vitamins: Secondary | ICD-10-CM

## 2010-09-23 DIAGNOSIS — C50419 Malignant neoplasm of upper-outer quadrant of unspecified female breast: Secondary | ICD-10-CM

## 2010-09-29 ENCOUNTER — Ambulatory Visit
Admission: RE | Admit: 2010-09-29 | Discharge: 2010-09-29 | Disposition: A | Discharge status: Home / Self Care | Payer: Medicare Other | Source: Ambulatory Visit | Attending: Radiation Oncology | Admitting: Radiation Oncology

## 2010-10-13 ENCOUNTER — Telehealth: Payer: Self-pay | Admitting: Internal Medicine

## 2010-10-13 DIAGNOSIS — E039 Hypothyroidism, unspecified: Secondary | ICD-10-CM

## 2010-10-13 NOTE — Telephone Encounter (Signed)
Rx denied office visit needed.

## 2010-10-13 NOTE — Telephone Encounter (Signed)
Refill- levothyroxine sodium tab. Take one tablet by mouth every day. Qty 30. Last fill 8.13.12

## 2010-10-14 ENCOUNTER — Encounter (HOSPITAL_BASED_OUTPATIENT_CLINIC_OR_DEPARTMENT_OTHER): Payer: Medicare Other | Admitting: Oncology

## 2010-10-14 DIAGNOSIS — C50419 Malignant neoplasm of upper-outer quadrant of unspecified female breast: Secondary | ICD-10-CM

## 2010-10-14 DIAGNOSIS — C7952 Secondary malignant neoplasm of bone marrow: Secondary | ICD-10-CM

## 2010-10-14 DIAGNOSIS — C7951 Secondary malignant neoplasm of bone: Secondary | ICD-10-CM

## 2010-10-14 DIAGNOSIS — E538 Deficiency of other specified B group vitamins: Secondary | ICD-10-CM

## 2010-10-14 LAB — COMPREHENSIVE METABOLIC PANEL
ALT: 14 U/L (ref 0–35)
AST: 11 U/L (ref 0–37)
Creatinine, Ser: 1.39 mg/dL — ABNORMAL HIGH (ref 0.50–1.10)
Total Bilirubin: 0.2 mg/dL — ABNORMAL LOW (ref 0.3–1.2)

## 2010-10-14 LAB — CBC WITH DIFFERENTIAL/PLATELET
BASO%: 0.3 % (ref 0.0–2.0)
EOS%: 3.6 % (ref 0.0–7.0)
HCT: 26.8 % — ABNORMAL LOW (ref 34.8–46.6)
LYMPH%: 20.8 % (ref 14.0–49.7)
MCH: 31.1 pg (ref 25.1–34.0)
MCHC: 34.2 g/dL (ref 31.5–36.0)
NEUT%: 67.3 % (ref 38.4–76.8)
RBC: 2.95 10*6/uL — ABNORMAL LOW (ref 3.70–5.45)
WBC: 5.7 10*3/uL (ref 3.9–10.3)
lymph#: 1.2 10*3/uL (ref 0.9–3.3)

## 2010-10-14 LAB — CANCER ANTIGEN 27.29: CA 27.29: 91 U/mL — ABNORMAL HIGH (ref 0–39)

## 2010-10-20 ENCOUNTER — Other Ambulatory Visit: Payer: Self-pay | Admitting: Oncology

## 2010-10-20 DIAGNOSIS — C50919 Malignant neoplasm of unspecified site of unspecified female breast: Secondary | ICD-10-CM

## 2010-10-24 ENCOUNTER — Encounter (HOSPITAL_COMMUNITY)
Admission: RE | Admit: 2010-10-24 | Discharge: 2010-10-24 | Disposition: A | Discharge status: Home / Self Care | Payer: Medicare Other | Source: Ambulatory Visit | Attending: Oncology | Admitting: Oncology

## 2010-10-24 ENCOUNTER — Other Ambulatory Visit: Payer: Self-pay | Admitting: Oncology

## 2010-10-24 ENCOUNTER — Encounter (HOSPITAL_BASED_OUTPATIENT_CLINIC_OR_DEPARTMENT_OTHER): Payer: Medicare Other | Admitting: Oncology

## 2010-10-24 DIAGNOSIS — D649 Anemia, unspecified: Secondary | ICD-10-CM | POA: Insufficient documentation

## 2010-10-24 DIAGNOSIS — C50919 Malignant neoplasm of unspecified site of unspecified female breast: Secondary | ICD-10-CM | POA: Insufficient documentation

## 2010-10-24 DIAGNOSIS — E538 Deficiency of other specified B group vitamins: Secondary | ICD-10-CM

## 2010-10-24 DIAGNOSIS — C50419 Malignant neoplasm of upper-outer quadrant of unspecified female breast: Secondary | ICD-10-CM

## 2010-10-24 DIAGNOSIS — C7952 Secondary malignant neoplasm of bone marrow: Secondary | ICD-10-CM

## 2010-10-24 LAB — CBC WITH DIFFERENTIAL/PLATELET
Basophils Absolute: 0 10*3/uL (ref 0.0–0.1)
EOS%: 4.4 % (ref 0.0–7.0)
Eosinophils Absolute: 0.2 10*3/uL (ref 0.0–0.5)
HCT: 26.8 % — ABNORMAL LOW (ref 34.8–46.6)
HGB: 9.1 g/dL — ABNORMAL LOW (ref 11.6–15.9)
MCH: 31.2 pg (ref 25.1–34.0)
NEUT#: 3.6 10*3/uL (ref 1.5–6.5)
NEUT%: 65 % (ref 38.4–76.8)
lymph#: 1.2 10*3/uL (ref 0.9–3.3)

## 2010-10-24 LAB — ABO/RH: ABO/RH(D): O NEG

## 2010-10-26 ENCOUNTER — Ambulatory Visit
Admission: RE | Admit: 2010-10-26 | Discharge: 2010-10-26 | Disposition: A | Discharge status: Home / Self Care | Payer: Medicare Other | Source: Ambulatory Visit | Attending: Radiation Oncology | Admitting: Radiation Oncology

## 2010-10-26 ENCOUNTER — Encounter (HOSPITAL_BASED_OUTPATIENT_CLINIC_OR_DEPARTMENT_OTHER): Payer: Medicare Other | Admitting: Oncology

## 2010-10-26 DIAGNOSIS — C7951 Secondary malignant neoplasm of bone: Secondary | ICD-10-CM

## 2010-10-26 DIAGNOSIS — C50419 Malignant neoplasm of upper-outer quadrant of unspecified female breast: Secondary | ICD-10-CM

## 2010-10-26 DIAGNOSIS — D510 Vitamin B12 deficiency anemia due to intrinsic factor deficiency: Secondary | ICD-10-CM

## 2010-10-27 LAB — CROSSMATCH: Unit division: 0

## 2010-11-01 ENCOUNTER — Ambulatory Visit (HOSPITAL_COMMUNITY): Payer: Medicare Other

## 2010-11-01 ENCOUNTER — Encounter (HOSPITAL_COMMUNITY): Payer: Medicare Other

## 2010-11-10 ENCOUNTER — Encounter (HOSPITAL_BASED_OUTPATIENT_CLINIC_OR_DEPARTMENT_OTHER): Payer: Medicare Other | Admitting: Oncology

## 2010-11-10 ENCOUNTER — Other Ambulatory Visit: Payer: Self-pay | Admitting: Oncology

## 2010-11-10 DIAGNOSIS — C7952 Secondary malignant neoplasm of bone marrow: Secondary | ICD-10-CM

## 2010-11-10 DIAGNOSIS — C7951 Secondary malignant neoplasm of bone: Secondary | ICD-10-CM

## 2010-11-10 DIAGNOSIS — D510 Vitamin B12 deficiency anemia due to intrinsic factor deficiency: Secondary | ICD-10-CM

## 2010-11-10 DIAGNOSIS — C50419 Malignant neoplasm of upper-outer quadrant of unspecified female breast: Secondary | ICD-10-CM

## 2010-11-10 DIAGNOSIS — E538 Deficiency of other specified B group vitamins: Secondary | ICD-10-CM

## 2010-11-10 DIAGNOSIS — C50919 Malignant neoplasm of unspecified site of unspecified female breast: Secondary | ICD-10-CM

## 2010-11-10 DIAGNOSIS — Z9010 Acquired absence of unspecified breast and nipple: Secondary | ICD-10-CM

## 2010-11-10 LAB — CBC WITH DIFFERENTIAL/PLATELET
Basophils Absolute: 0 10*3/uL (ref 0.0–0.1)
Eosinophils Absolute: 0.2 10*3/uL (ref 0.0–0.5)
HCT: 29.4 % — ABNORMAL LOW (ref 34.8–46.6)
HGB: 9.9 g/dL — ABNORMAL LOW (ref 11.6–15.9)
LYMPH%: 21.6 % (ref 14.0–49.7)
MCV: 91.8 fL (ref 79.5–101.0)
MONO#: 0.5 10*3/uL (ref 0.1–0.9)
MONO%: 8.7 % (ref 0.0–14.0)
NEUT#: 4.1 10*3/uL (ref 1.5–6.5)
NEUT%: 66.3 % (ref 38.4–76.8)
Platelets: 204 10*3/uL (ref 145–400)
WBC: 6.2 10*3/uL (ref 3.9–10.3)

## 2010-11-10 LAB — COMPREHENSIVE METABOLIC PANEL
Alkaline Phosphatase: 105 U/L (ref 39–117)
BUN: 29 mg/dL — ABNORMAL HIGH (ref 6–23)
CO2: 26 mEq/L (ref 19–32)
Glucose, Bld: 283 mg/dL — ABNORMAL HIGH (ref 70–99)
Total Bilirubin: 0.3 mg/dL (ref 0.3–1.2)

## 2010-11-16 ENCOUNTER — Encounter (HOSPITAL_COMMUNITY)
Admission: RE | Admit: 2010-11-16 | Discharge: 2010-11-16 | Disposition: A | Discharge status: Home / Self Care | Payer: Medicare Other | Source: Ambulatory Visit | Attending: Oncology | Admitting: Oncology

## 2010-11-16 ENCOUNTER — Encounter (HOSPITAL_COMMUNITY): Payer: Self-pay

## 2010-11-16 DIAGNOSIS — Z9010 Acquired absence of unspecified breast and nipple: Secondary | ICD-10-CM | POA: Insufficient documentation

## 2010-11-16 DIAGNOSIS — M25519 Pain in unspecified shoulder: Secondary | ICD-10-CM | POA: Insufficient documentation

## 2010-11-16 DIAGNOSIS — C50919 Malignant neoplasm of unspecified site of unspecified female breast: Secondary | ICD-10-CM | POA: Insufficient documentation

## 2010-11-16 DIAGNOSIS — C7951 Secondary malignant neoplasm of bone: Secondary | ICD-10-CM | POA: Insufficient documentation

## 2010-11-16 MED ORDER — TECHNETIUM TC 99M MEDRONATE IV KIT
23.4000 | PACK | Freq: Once | INTRAVENOUS | Status: AC | PRN
  Administered 2010-11-16: 23.4 via INTRAVENOUS

## 2010-11-18 ENCOUNTER — Telehealth: Payer: Self-pay | Admitting: Internal Medicine

## 2010-11-18 DIAGNOSIS — E039 Hypothyroidism, unspecified: Secondary | ICD-10-CM

## 2010-11-18 MED ORDER — LEVOTHYROXINE SODIUM 150 MCG PO TABS: 150.0000 ug | ORAL_TABLET | Freq: Every day | ORAL | Status: DC

## 2010-11-18 NOTE — Telephone Encounter (Signed)
rx sent in electronically 

## 2010-11-18 NOTE — Telephone Encounter (Signed)
Refill-levothyroxine sodium tab. Take one tablet by mouth every day. Qty 30 last fill 9.13.12

## 2010-11-22 ENCOUNTER — Other Ambulatory Visit: Payer: Self-pay | Admitting: Nurse Practitioner

## 2010-11-25 ENCOUNTER — Other Ambulatory Visit: Payer: Self-pay | Admitting: Oncology

## 2010-11-25 ENCOUNTER — Ambulatory Visit
Admission: RE | Admit: 2010-11-25 | Discharge: 2010-11-25 | Disposition: A | Discharge status: Home / Self Care | Payer: Medicare Other | Source: Ambulatory Visit | Attending: Oncology | Admitting: Oncology

## 2010-11-25 DIAGNOSIS — Z853 Personal history of malignant neoplasm of breast: Secondary | ICD-10-CM

## 2010-12-05 ENCOUNTER — Telehealth: Payer: Self-pay | Admitting: Oncology

## 2010-12-05 ENCOUNTER — Encounter: Payer: Self-pay | Admitting: Surgery

## 2010-12-05 NOTE — Telephone Encounter (Signed)
pt called lmovm to r/s flush appt to same day as inj on 11/09.  called pts hiome lmovm that her lab flush and inj was scheduled for 11/09 @ 2:30pm °

## 2010-12-05 NOTE — Telephone Encounter (Signed)
pt called lmovm to r/s flush appt to same day as inj on 11/09.  called pts hiome lmovm that her lab flush and inj was scheduled for 11/09 @ 2:30pm

## 2010-12-07 ENCOUNTER — Other Ambulatory Visit: Payer: Self-pay | Admitting: Oncology

## 2010-12-07 DIAGNOSIS — C50919 Malignant neoplasm of unspecified site of unspecified female breast: Secondary | ICD-10-CM

## 2010-12-07 DIAGNOSIS — D649 Anemia, unspecified: Secondary | ICD-10-CM

## 2010-12-08 ENCOUNTER — Other Ambulatory Visit: Payer: Self-pay | Admitting: Oncology

## 2010-12-08 ENCOUNTER — Other Ambulatory Visit (HOSPITAL_BASED_OUTPATIENT_CLINIC_OR_DEPARTMENT_OTHER): Payer: Medicare Other | Admitting: Lab

## 2010-12-08 ENCOUNTER — Ambulatory Visit (HOSPITAL_BASED_OUTPATIENT_CLINIC_OR_DEPARTMENT_OTHER): Payer: Medicare Other

## 2010-12-08 ENCOUNTER — Other Ambulatory Visit: Payer: Self-pay | Admitting: Physician Assistant

## 2010-12-08 VITALS — BP 180/81 | HR 69 | Temp 97.9°F

## 2010-12-08 DIAGNOSIS — C50919 Malignant neoplasm of unspecified site of unspecified female breast: Secondary | ICD-10-CM

## 2010-12-08 DIAGNOSIS — C7952 Secondary malignant neoplasm of bone marrow: Secondary | ICD-10-CM

## 2010-12-08 DIAGNOSIS — C50419 Malignant neoplasm of upper-outer quadrant of unspecified female breast: Secondary | ICD-10-CM

## 2010-12-08 DIAGNOSIS — E538 Deficiency of other specified B group vitamins: Secondary | ICD-10-CM

## 2010-12-08 LAB — CBC WITH DIFFERENTIAL/PLATELET
Basophils Absolute: 0 10*3/uL (ref 0.0–0.1)
Eosinophils Absolute: 0.3 10*3/uL (ref 0.0–0.5)
HGB: 10.3 g/dL — ABNORMAL LOW (ref 11.6–15.9)
MCV: 92.4 fL (ref 79.5–101.0)
MONO#: 0.5 10*3/uL (ref 0.1–0.9)
MONO%: 7.8 % (ref 0.0–14.0)
NEUT#: 4.5 10*3/uL (ref 1.5–6.5)
Platelets: 218 10*3/uL (ref 145–400)
RBC: 3.26 10*6/uL — ABNORMAL LOW (ref 3.70–5.45)
RDW: 14.7 % — ABNORMAL HIGH (ref 11.2–14.5)
WBC: 6.7 10*3/uL (ref 3.9–10.3)

## 2010-12-08 LAB — COMPREHENSIVE METABOLIC PANEL
Albumin: 3.5 g/dL (ref 3.5–5.2)
Alkaline Phosphatase: 100 U/L (ref 39–117)
BUN: 35 mg/dL — ABNORMAL HIGH (ref 6–23)
CO2: 25 mEq/L (ref 19–32)
Calcium: 9.5 mg/dL (ref 8.4–10.5)
Glucose, Bld: 191 mg/dL — ABNORMAL HIGH (ref 70–99)
Potassium: 4.9 mEq/L (ref 3.5–5.3)
Sodium: 135 mEq/L (ref 135–145)
Total Protein: 7.3 g/dL (ref 6.0–8.3)

## 2010-12-08 MED ORDER — SODIUM CHLORIDE 0.9 % IJ SOLN
10.0000 mL | INTRAMUSCULAR | Status: DC | PRN
  Administered 2010-12-08: 10 mL via INTRAVENOUS
  Filled 2010-12-08: qty 10

## 2010-12-08 MED ORDER — HEPARIN SOD (PORK) LOCK FLUSH 100 UNIT/ML IV SOLN
500.0000 [IU] | Freq: Once | INTRAVENOUS | Status: AC
  Administered 2010-12-08: 500 [IU] via INTRAVENOUS
  Filled 2010-12-08: qty 5

## 2010-12-08 MED ORDER — DENOSUMAB 120 MG/1.7ML ~~LOC~~ SOLN
120.0000 mg | Freq: Once | SUBCUTANEOUS | Status: AC
  Administered 2010-12-08: 120 mg via SUBCUTANEOUS

## 2010-12-08 MED ORDER — CYANOCOBALAMIN 1000 MCG/ML IJ SOLN
1000.0000 ug | Freq: Once | INTRAMUSCULAR | Status: AC
  Administered 2010-12-08: 1000 ug via INTRAMUSCULAR

## 2010-12-09 ENCOUNTER — Ambulatory Visit: Payer: Medicare Other

## 2010-12-12 ENCOUNTER — Other Ambulatory Visit: Payer: Self-pay

## 2010-12-12 ENCOUNTER — Ambulatory Visit: Payer: Self-pay | Admitting: Surgery

## 2010-12-14 ENCOUNTER — Other Ambulatory Visit: Payer: Self-pay | Admitting: *Deleted

## 2010-12-14 DIAGNOSIS — C50419 Malignant neoplasm of upper-outer quadrant of unspecified female breast: Secondary | ICD-10-CM

## 2010-12-14 MED ORDER — LETROZOLE 2.5 MG PO TABS: 2.5000 mg | ORAL_TABLET | Freq: Every day | ORAL | Status: DC

## 2010-12-29 ENCOUNTER — Other Ambulatory Visit: Payer: Self-pay

## 2010-12-29 MED ORDER — CITALOPRAM HYDROBROMIDE 20 MG PO TABS: 20.0000 mg | ORAL_TABLET | Freq: Every day | ORAL | Status: DC

## 2010-12-30 ENCOUNTER — Other Ambulatory Visit: Payer: Self-pay

## 2010-12-30 DIAGNOSIS — C50419 Malignant neoplasm of upper-outer quadrant of unspecified female breast: Secondary | ICD-10-CM

## 2010-12-30 MED ORDER — OXYCODONE-ACETAMINOPHEN 5-325 MG PO TABS: 1.0000 | ORAL_TABLET | Freq: Four times a day (QID) | ORAL | Status: DC | PRN

## 2010-12-30 NOTE — Telephone Encounter (Signed)
Received call from Liborio Nixon, injection RN, stating that pt was here to pick up a prescription for refill on percocet.  Ok per Zollie Scale, PA.  Liborio Nixon will give to pt.

## 2011-01-02 ENCOUNTER — Other Ambulatory Visit: Payer: Self-pay

## 2011-01-02 ENCOUNTER — Ambulatory Visit: Payer: Self-pay | Admitting: Surgery

## 2011-01-04 ENCOUNTER — Encounter: Payer: Medicare Other | Admitting: Internal Medicine

## 2011-01-05 ENCOUNTER — Encounter: Payer: Self-pay | Admitting: Physician Assistant

## 2011-01-05 ENCOUNTER — Ambulatory Visit (HOSPITAL_BASED_OUTPATIENT_CLINIC_OR_DEPARTMENT_OTHER): Payer: Medicare Other | Admitting: Physician Assistant

## 2011-01-05 ENCOUNTER — Other Ambulatory Visit: Payer: Self-pay | Admitting: Oncology

## 2011-01-05 ENCOUNTER — Other Ambulatory Visit (HOSPITAL_BASED_OUTPATIENT_CLINIC_OR_DEPARTMENT_OTHER): Payer: Medicare Other | Admitting: Lab

## 2011-01-05 ENCOUNTER — Telehealth: Payer: Self-pay | Admitting: Oncology

## 2011-01-05 VITALS — BP 128/67 | HR 66 | Temp 97.8°F | Ht 62.0 in | Wt 270.9 lb

## 2011-01-05 DIAGNOSIS — Z170 Estrogen receptor positive status [ER+]: Secondary | ICD-10-CM

## 2011-01-05 DIAGNOSIS — C7952 Secondary malignant neoplasm of bone marrow: Secondary | ICD-10-CM

## 2011-01-05 DIAGNOSIS — C50919 Malignant neoplasm of unspecified site of unspecified female breast: Secondary | ICD-10-CM

## 2011-01-05 DIAGNOSIS — D510 Vitamin B12 deficiency anemia due to intrinsic factor deficiency: Secondary | ICD-10-CM

## 2011-01-05 DIAGNOSIS — C50419 Malignant neoplasm of upper-outer quadrant of unspecified female breast: Secondary | ICD-10-CM

## 2011-01-05 DIAGNOSIS — D649 Anemia, unspecified: Secondary | ICD-10-CM

## 2011-01-05 LAB — COMPREHENSIVE METABOLIC PANEL
Albumin: 3.3 g/dL — ABNORMAL LOW (ref 3.5–5.2)
Alkaline Phosphatase: 91 U/L (ref 39–117)
BUN: 35 mg/dL — ABNORMAL HIGH (ref 6–23)
CO2: 22 mEq/L (ref 19–32)
Calcium: 9.9 mg/dL (ref 8.4–10.5)
Chloride: 101 mEq/L (ref 96–112)
Glucose, Bld: 240 mg/dL — ABNORMAL HIGH (ref 70–99)
Potassium: 4.6 mEq/L (ref 3.5–5.3)
Sodium: 135 mEq/L (ref 135–145)
Total Protein: 7.4 g/dL (ref 6.0–8.3)

## 2011-01-05 LAB — CBC WITH DIFFERENTIAL/PLATELET
Basophils Absolute: 0 10*3/uL (ref 0.0–0.1)
Eosinophils Absolute: 0.4 10*3/uL (ref 0.0–0.5)
HGB: 9.6 g/dL — ABNORMAL LOW (ref 11.6–15.9)
MONO#: 0.5 10*3/uL (ref 0.1–0.9)
MONO%: 6 % (ref 0.0–14.0)
NEUT#: 6.5 10*3/uL (ref 1.5–6.5)
RBC: 3.12 10*6/uL — ABNORMAL LOW (ref 3.70–5.45)
RDW: 14.3 % (ref 11.2–14.5)
WBC: 8.9 10*3/uL (ref 3.9–10.3)
lymph#: 1.4 10*3/uL (ref 0.9–3.3)

## 2011-01-05 MED ORDER — GABAPENTIN 300 MG PO CAPS: 300.0000 mg | ORAL_CAPSULE | Freq: Every evening | ORAL | Status: DC | PRN

## 2011-01-05 MED ORDER — FLUCONAZOLE 100 MG PO TABS: 100.0000 mg | ORAL_TABLET | Freq: Every day | ORAL | Status: AC

## 2011-01-05 MED ORDER — CYANOCOBALAMIN 1000 MCG/ML IJ SOLN
1000.0000 ug | Freq: Once | INTRAMUSCULAR | Status: AC
  Administered 2011-01-05: 1000 ug via INTRAMUSCULAR

## 2011-01-05 MED ORDER — DENOSUMAB 120 MG/1.7ML ~~LOC~~ SOLN
120.0000 mg | Freq: Once | SUBCUTANEOUS | Status: AC
  Administered 2011-01-05: 120 mg via SUBCUTANEOUS
  Filled 2011-01-05: qty 1.7

## 2011-01-05 NOTE — Telephone Encounter (Signed)
Gv pt appt for jan2013 °

## 2011-01-05 NOTE — Progress Notes (Signed)
Hematology and Oncology Follow Up Visit  Brittney Cunningham [id number] Jul 30, 1940 70 y.o. 01/05/2011    HPI: Brittney Cunningham is a [place] woman who at the age of 54 was referred by Dr. Dwain Sarna for followup of right breast carcinoma.  The patient has a very complicated history of present illness. As part of a workup for shortness of breath and an abnormal stress Cardiolite study, a chest x-ray was obtained by Dr. Peter Swaziland on 05/19/2009. In addition to cardiomegaly and degenerative changes, there were sclerotic foci throughout the entire bony thorax, suggesting possible metastatic disease of the skeleton.  A screening mammogram on 06/10/2009 showed a possible mass in the right breast. Subsequently a diagnostic mammogram and right breast ultrasound was obtained at the breast Center on 06/14/2009, confirming a spiculated mass in the far outer portion of the right breast measuring 4.9 cm mammographically. This was palpable as a 2-3 cm mass. By ultrasonography, it measured at least 1.8 cm with additional linear hypoechoic areas extending to the nipple. The mammographic measurement was felt to be more accurate.  On 06/17/2009, the patient underwent biopsy of the right breast mass ([id number]), pathology showing an invasive ductal carcinoma, grade 2. The tumor was ER positive at 100%, PR positive at 70%, HER-2/neu negative with a ratio of 1.18 by CISH, and a borderline proliferation fraction of 26%.  A bone scan on 06/25/2009 was diffusely positive with abnormal uptake involving the skull, both femora, both humeri, ribs, pelvic bones, tibia, and fibula. No significant abnormal uptake in the spine.  CT of the head, chest, abdomen, and pelvis was also obtained. There was no intracranial abnormality. There was a nonspecific left upper lung nodule, 3 mm, in the setting of a centrilobular emphysema. There is a nonspecific right subpectoral lymph node measuring 5 mm. The mass in the right breast was noted  without obvious axillary adenopathy. There was significant atherosclerosis and mild cardiomegaly. Overall, no definitive evidence of parenchymal involvement of the lungs, liver, abdomen, or pelvis outside of the bones.  Status post left iliac bone biopsy on 07/06/2009 ([id number]) showing metastatic carcinoma consistent with the patient's known breast cancer.  Patient was treated neoadjuvantly with letrozole and zoledronic acid beginning in June 2011. Status post right modified radical mastectomy on 03/03/2010 ([id number]) with final pathology showing a grade 1 invasive ductal carcinoma measuring a little over 3.3 cm. 6 of 8 lymph nodes were involved with extracapsular extension and all 6. Negative margins, although close.  Patient underwent postmastectomy radiation completed August of 2012. She has continued on letrozole. Change from zoledronic acid to Xgeva in September 2012 due to concerns regarding serum creatinine.  Comorbidities include pernicious anemia, for which patient receives vitamin B 12 injections monthly.  Interim History:   Patient returns today accompanied by her husband, due for her next monthly doses of Xgeva and Vitamin B12. Last injections were given on 12/08/2010. Patient is also status post recent transfusion in late September 2012 for her pernicious anemia.  The patient continues to have quite a bit of pain, including bone pain, as well as postsurgical pain in the right chest wall and right axilla. She continues to take oxycodone/APAP, averaging one tablet 3 times daily. (She notes that she may need a new prescription in the next couple weeks, since her last prescription was for only 60 tablets.) This controls her pain reasonably well. She continues to have numbness in the extremities secondary to her diabetes. She's also having some tingling and burning pain postsurgically in the right  chest wall. She has a small area in the right axilla which is been very slow to heal. There  is no drainage in the area, although it stays very moist.  Finally the patient complains of constipation for which she is utilizing a stool softener along with MiraLAX. Of course this is worsened by the pain medication, and the patient knows to take stool softeners on a daily basis.  A detailed review of systems is otherwise noncontributory as noted below. Specifically O. noted that the patient has tolerated the Xgeva with no increased bony pain, no significant hypocalcemia, and no jaw pain.  Review of Systems: Constitutional:  no weight loss, fever, night sweats Eyes: negative [id number] Cardiovascular: no chest pain or dyspnea on exertion Respiratory: no cough, shortness of breath, or wheezing Neurological: positive for - numbness/tingling Dermatological: negative Gastrointestinal: positive for - constipation negative for - abdominal pain, blood in stools or nausea/vomiting Genito-Urinary: no dysuria, trouble voiding, or hematuria Hematological and Lymphatic: negative Breast: negative Musculoskeletal: positive for - diffuse pain and postsurgical pain in right chest wall and axilla Remaining ROS negative.  Medications: I have reviewed the patient's current medications. Current Outpatient Prescriptions  Medication Sig Dispense Refill  . aspirin 81 MG tablet Take 81 mg by mouth daily.        . bisacodyl (DULCOLAX) 5 MG EC tablet Take 5 mg by mouth daily as needed.        . calcium carbonate (OS-CAL) 600 MG TABS Take 600 mg by mouth 2 (two) times daily with a meal.        . calcium carbonate (TUMS - DOSED IN MG ELEMENTAL CALCIUM) 500 MG chewable tablet Chew 1 tablet by mouth 2 (two) times daily.        . carvedilol (COREG) 12.5 MG tablet Take 1 tablet (12.5 mg total) by mouth 2 (two) times daily.  60 tablet  5  . citalopram (CELEXA) 20 MG tablet Take 1 tablet (20 mg total) by mouth daily.  30 tablet  3  . cyanocobalamin (,VITAMIN B-12,) 1000 MCG/ML injection Inject 1,000 mcg into the  muscle every 30 (thirty) days.        . hydrochlorothiazide (,MICROZIDE/HYDRODIURIL,) 12.5 MG capsule Take 1 capsule (12.5 mg total) by mouth every morning.  30 capsule  6  . ibuprofen (ADVIL) 200 MG tablet Take 200 mg by mouth 3 (three) times daily.        . insulin glargine (LANTUS) 100 UNIT/ML injection Inject 45 Units into the skin at bedtime.  20 mL  6  . letrozole (FEMARA) 2.5 MG tablet Take 1 tablet (2.5 mg total) by mouth daily.  30 tablet  1  . levothyroxine (SYNTHROID) 150 MCG tablet Take 1 tablet (150 mcg total) by mouth daily.  30 tablet  3  . lidocaine-prilocaine (EMLA) cream Apply 1 application topically as needed. Prior to infusion       . LORazepam (ATIVAN) 0.5 MG tablet Take 0.5 mg by mouth 2 (two) times daily.        Marland Kitchen losartan (COZAAR) 50 MG tablet Take 1 tablet (50 mg total) by mouth daily.  30 tablet  6  . oxyCODONE-acetaminophen (PERCOCET) 5-325 MG per tablet Take 1 tablet by mouth every 6 (six) hours as needed for pain.  60 tablet  0  . simvastatin (ZOCOR) 40 MG tablet Take 1 tablet (40 mg total) by mouth at bedtime.  30 tablet  6  . fluconazole (DIFLUCAN) 100 MG tablet Take 1 tablet (100 mg  total) by mouth daily.  10 tablet  0  . gabapentin (NEURONTIN) 300 MG capsule Take 1 capsule (300 mg total) by mouth at bedtime as needed.  30 capsule  2  . omeprazole (PRILOSEC) 20 MG capsule Take 20 mg by mouth daily.         Current Facility-Administered Medications  Medication Dose Route Frequency Provider Last Rate Last Dose  . cyanocobalamin ((VITAMIN B-12)) injection 1,000 mcg  1,000 mcg Intramuscular Once Lowella Dell, MD   1,000 mcg at 01/05/11 1045  . denosumab (XGEVA) injection 120 mg  120 mg Subcutaneous Once Zollie Scale, PA   120 mg at 01/05/11 1048    Allergies:  Allergies  Allergen Reactions  . Chlorhexidine Hives, Itching and Rash  . Codeine     REACTION: swelling    Physical Exam: Filed Vitals:   01/05/11 1000  BP: 128/67  Pulse: 66  Temp: 97.8 F (36.6  C)   HEENT:  Sclerae anicteric, conjunctivae pale.  Oropharynx clear.  No mucositis or candidiasis.   Nodes:  No cervical, supraclavicular, or axillary lymphadenopathy palpated.  Breast Exam:   Status post right mastectomy. Overall, well-healed incision although there is still a very small area of dehiscence in the right axilla, almost completely healed, with no evidence of infection. There is some erythema around this area which is quite moist and appears fungal. No suspicious masses or evidence of local recurrence.  Lungs:  Clear to auscultation bilaterally.  No crackles, rhonchi, or wheezes.   Heart:  Regular rate and rhythm.   Abdomen:  Soft, obese, nontender.  Positive bowel sounds.   Musculoskeletal:  No focal spinal tenderness to gentle palpation.  Extremities:  Nonpitting pedal edema bilaterally, equal bilaterally. No upper extremity edema noted. No peripheral cyanosis.   Skin:  Benign.   Neuro:  Nonfocal.   Lab Results: Lab Results  Component Value Date   WBC 8.9 01/05/2011   HGB 9.6* 01/05/2011   HCT 28.6* 01/05/2011   MCV 91.6 01/05/2011   PLT 279 01/05/2011   NEUTROABS 6.5 01/05/2011     Chemistry      Component Value Date/Time   NA 135 01/05/2011 0902   K 4.6 01/05/2011 0902   CL 101 01/05/2011 0902   CO2 22 01/05/2011 0902   BUN 35* 01/05/2011 0902   CREATININE 1.33* 01/05/2011 0902      Component Value Date/Time   CALCIUM 9.9 01/05/2011 0902   ALKPHOS 91 01/05/2011 0902   AST 10 01/05/2011 0902   ALT 11 01/05/2011 0902   BILITOT 0.2* 01/05/2011 0902        Radiological Studies:  Left screening mammogram on 11/25/2010 showed no specific mammographic evidence of malignancy. Next screening mammogram is recommended in one  year.   Whole-body bone scan on 11/16/2010 showed disease stability with widespread osseous metastases, better demonstrated on prior CT, grossly unchanged. Increased radiotracer uptake in multiple right ribs is slightly more prominent on the current  study, although this may be technical.  Assessment:  70 year old [place] woman   1. Status post right breast biopsy in May 2011 for a grade 2 invasive ductal carcinoma, T2 Nx M1, with bone only involvement. Strongly ER and PR positive, HER-2/neu negative, with MIB-1 of 26%.  2. Neoadjuvantly, received letrozole and zoledronic acid beginning in June 2011.  3. Status post right modified radical mastectomy in February 2012 for a T2 N2 grade 1 invasive ductal carcinoma. Negative margins, although close.  4. Received postmastectomy radiation to the  right chest wall, completed August 2012.  5. Continues on letrozole, 2.5 mg daily. Switched from zoledronic acid to Xgeva in September 2012 because of concerns regarding the serum creatinine which has improved. Due for next injection of Xgeva today, last given 12/08/2010.  6. History of pernicious anemia, receives vitamin B 12 injections monthly, due again today. Also status post recent blood transfusion on 10/26/2010 for symptomatic anemia.  Plan:  The patient will receive both Xgeva and vitamin B12 today as planned. She knows to chew a few extra toms and continue taking her calcium as previously instructed to avoid hypocalcemia.  Patient will continue on her oxycodone/APAP and will call if she needs a refill. We are also going to try a low dose of gabapentin at night, 300 mg, to see if this helps with the postsurgical pain, the peripheral neuropathy, and perhaps her sleeplessness as well. We'll also refill her Ambien per her request.  Also prescribing a course of Diflucan for what appears to be a fungal rash in the right axilla. She will call if this worsens.  Otherwise the patient is scheduled for a port flush on December 20. She will return in 4 weeks for repeat labs and injections. She will see Dr. Darnelle Catalan on January 31 with her injections.   This plan was reviewed with the patient, who voices understanding and agreement.  She knows to call  with any changes or problems.    Travonna Swindle, PA-C 01/05/2011

## 2011-01-11 ENCOUNTER — Other Ambulatory Visit: Payer: Self-pay | Admitting: *Deleted

## 2011-01-11 DIAGNOSIS — C50419 Malignant neoplasm of upper-outer quadrant of unspecified female breast: Secondary | ICD-10-CM

## 2011-01-11 MED ORDER — LETROZOLE 2.5 MG PO TABS: 2.5000 mg | ORAL_TABLET | Freq: Every day | ORAL | Status: DC

## 2011-01-17 ENCOUNTER — Telehealth: Payer: Self-pay | Admitting: Internal Medicine

## 2011-01-17 MED ORDER — INSULIN GLARGINE 100 UNIT/ML ~~LOC~~ SOLN: 45.0000 [IU] | Freq: Every day | SUBCUTANEOUS | Status: DC

## 2011-01-17 NOTE — Telephone Encounter (Signed)
Refill- lantus 100 unit/ml injection #10. Inject 25-30 units subcutaneously at bedtime. Last fill 11.21.12

## 2011-01-17 NOTE — Telephone Encounter (Signed)
Call placed to patient [phone], she was informed of medication refill on Lantus to pharmacy. She was advised to schedule follow up per Dr Rodena Medin prior to refills running out. Patient acknowledge that her blood sugars are not under control,and states she will follow up after the first of the year.

## 2011-01-18 ENCOUNTER — Other Ambulatory Visit: Payer: Self-pay | Admitting: *Deleted

## 2011-01-18 DIAGNOSIS — C50419 Malignant neoplasm of upper-outer quadrant of unspecified female breast: Secondary | ICD-10-CM

## 2011-01-18 MED ORDER — OXYCODONE-ACETAMINOPHEN 5-325 MG PO TABS: 1.0000 | ORAL_TABLET | Freq: Four times a day (QID) | ORAL | Status: DC | PRN

## 2011-01-19 ENCOUNTER — Ambulatory Visit (HOSPITAL_BASED_OUTPATIENT_CLINIC_OR_DEPARTMENT_OTHER): Payer: Medicare Other

## 2011-01-19 DIAGNOSIS — C50919 Malignant neoplasm of unspecified site of unspecified female breast: Secondary | ICD-10-CM

## 2011-01-19 DIAGNOSIS — Z469 Encounter for fitting and adjustment of unspecified device: Secondary | ICD-10-CM

## 2011-01-19 MED ORDER — SODIUM CHLORIDE 0.9 % IJ SOLN
10.0000 mL | INTRAMUSCULAR | Status: DC | PRN
  Administered 2011-01-19: 10 mL via INTRAVENOUS
  Filled 2011-01-19: qty 10

## 2011-01-19 MED ORDER — HEPARIN SOD (PORK) LOCK FLUSH 100 UNIT/ML IV SOLN
500.0000 [IU] | Freq: Once | INTRAVENOUS | Status: AC
  Administered 2011-01-19: 500 [IU] via INTRAVENOUS
  Filled 2011-01-19: qty 5

## 2011-01-19 NOTE — Patient Instructions (Signed)
1610 Pt verbalized understanding of next appt date/time and to call if any problems.

## 2011-02-02 ENCOUNTER — Other Ambulatory Visit (HOSPITAL_BASED_OUTPATIENT_CLINIC_OR_DEPARTMENT_OTHER): Payer: Medicare Other | Admitting: Lab

## 2011-02-02 ENCOUNTER — Telehealth: Payer: Self-pay | Admitting: *Deleted

## 2011-02-02 ENCOUNTER — Ambulatory Visit (HOSPITAL_BASED_OUTPATIENT_CLINIC_OR_DEPARTMENT_OTHER): Payer: Medicare Other

## 2011-02-02 VITALS — BP 139/57 | HR 64 | Temp 97.3°F

## 2011-02-02 DIAGNOSIS — C50919 Malignant neoplasm of unspecified site of unspecified female breast: Secondary | ICD-10-CM

## 2011-02-02 DIAGNOSIS — D510 Vitamin B12 deficiency anemia due to intrinsic factor deficiency: Secondary | ICD-10-CM

## 2011-02-02 DIAGNOSIS — D649 Anemia, unspecified: Secondary | ICD-10-CM

## 2011-02-02 DIAGNOSIS — C7951 Secondary malignant neoplasm of bone: Secondary | ICD-10-CM

## 2011-02-02 DIAGNOSIS — C50419 Malignant neoplasm of upper-outer quadrant of unspecified female breast: Secondary | ICD-10-CM

## 2011-02-02 LAB — CBC WITH DIFFERENTIAL/PLATELET
EOS%: 2.9 % (ref 0.0–7.0)
Eosinophils Absolute: 0.2 10*3/uL (ref 0.0–0.5)
HGB: 10.5 g/dL — ABNORMAL LOW (ref 11.6–15.9)
MCH: 31.3 pg (ref 25.1–34.0)
MCV: 91.8 fL (ref 79.5–101.0)
MONO%: 7.5 % (ref 0.0–14.0)
NEUT#: 4.4 10*3/uL (ref 1.5–6.5)
RBC: 3.37 10*6/uL — ABNORMAL LOW (ref 3.70–5.45)
RDW: 14.6 % — ABNORMAL HIGH (ref 11.2–14.5)
lymph#: 1.2 10*3/uL (ref 0.9–3.3)

## 2011-02-02 LAB — CANCER ANTIGEN 27.29: CA 27.29: 114 U/mL — ABNORMAL HIGH (ref 0–39)

## 2011-02-02 LAB — COMPREHENSIVE METABOLIC PANEL
ALT: 10 U/L (ref 0–35)
Albumin: 3.5 g/dL (ref 3.5–5.2)
Alkaline Phosphatase: 105 U/L (ref 39–117)
Potassium: 4.6 mEq/L (ref 3.5–5.3)
Sodium: 132 mEq/L — ABNORMAL LOW (ref 135–145)
Total Bilirubin: 0.2 mg/dL — ABNORMAL LOW (ref 0.3–1.2)
Total Protein: 7.4 g/dL (ref 6.0–8.3)

## 2011-02-02 MED ORDER — DENOSUMAB 120 MG/1.7ML ~~LOC~~ SOLN
120.0000 mg | Freq: Once | SUBCUTANEOUS | Status: AC
  Administered 2011-02-02: 120 mg via SUBCUTANEOUS
  Filled 2011-02-02: qty 1.7

## 2011-02-02 MED ORDER — CYANOCOBALAMIN 1000 MCG/ML IJ SOLN
1000.0000 ug | Freq: Once | INTRAMUSCULAR | Status: AC
  Administered 2011-02-02: 1000 ug via INTRAMUSCULAR

## 2011-02-02 NOTE — Telephone Encounter (Signed)
This RN called and spoke with pt per cmet result with glucose of 418.  Brittney Cunningham states she has had issues with her blood sugar since MD stooped metformin in November.  Pt will monitor glucose at home and has sliding scale for use if needed.

## 2011-02-08 ENCOUNTER — Ambulatory Visit (INDEPENDENT_AMBULATORY_CARE_PROVIDER_SITE_OTHER): Payer: Medicare Other | Admitting: Internal Medicine

## 2011-02-08 ENCOUNTER — Encounter: Payer: Self-pay | Admitting: Internal Medicine

## 2011-02-08 DIAGNOSIS — E039 Hypothyroidism, unspecified: Secondary | ICD-10-CM

## 2011-02-08 DIAGNOSIS — E119 Type 2 diabetes mellitus without complications: Secondary | ICD-10-CM

## 2011-02-08 DIAGNOSIS — E785 Hyperlipidemia, unspecified: Secondary | ICD-10-CM

## 2011-02-08 LAB — T4, FREE: Free T4: 1.34 ng/dL (ref 0.80–1.80)

## 2011-02-08 LAB — LIPID PANEL
HDL: 27 mg/dL — ABNORMAL LOW (ref >39)
LDL Cholesterol: 114 mg/dL — ABNORMAL HIGH (ref 0–99)
Total CHOL/HDL Ratio: 6.7 Ratio
VLDL: 39 mg/dL (ref 0–40)

## 2011-02-08 LAB — TSH: TSH: 7.326 u[IU]/mL — ABNORMAL HIGH (ref 0.350–4.500)

## 2011-02-08 MED ORDER — INSULIN GLARGINE 100 UNIT/ML ~~LOC~~ SOLN: 45.0000 [IU] | Freq: Every day | SUBCUTANEOUS | Status: DC

## 2011-02-08 MED ORDER — METFORMIN HCL 500 MG PO TABS: 500.0000 mg | ORAL_TABLET | Freq: Every day | ORAL | Status: DC

## 2011-02-08 NOTE — Patient Instructions (Signed)
Please call a blood sugar report to the office in approximately 2-3 weeks

## 2011-02-09 LAB — MICROALBUMIN / CREATININE URINE RATIO
Creatinine, Urine: 111.7 mg/dL
Microalb, Ur: 0.83 mg/dL (ref 0.00–1.89)

## 2011-02-10 NOTE — Assessment & Plan Note (Signed)
Obtain lipid profile. 

## 2011-02-10 NOTE — Progress Notes (Signed)
  Subjective:    Patient ID: Brittney Cunningham, female    DOB: Sep 05, 1940, 71 y.o.   MRN: [id number]  HPI Pt presents to clinic for followup of multiple medical problems. Last visit metformin stopped due to persistently elevated creatinine. lantus was increased. Did not keep follow up appt. Has noted recent hyperglycemia with fsbs range 106-418 now s/p dietary modification most recent fsbs 106-155 without hypoglycemia. Currently using lantus 45-50 units qhs. Ongoing tx for breast cancer followed closely by oncology. Reviewed recent chem7's with improved creatinines. No other complaints.   Past Medical History  Diagnosis Date  . Anemia   . Depression   . Diabetes mellitus type II   . GERD (gastroesophageal reflux disease)   . Hyperlipidemia   . Hypothyroidism   . Osteoarthritis   . Coronary artery stenosis     bilateral  . CAD (coronary artery disease)   . OSA (obstructive sleep apnea)   . Breast cancer     right breast cancer-invasive  ductal carcinoma (StageIV)  . Hypertension   . Anxiety   . Carotid artery occlusion    Past Surgical History  Procedure Date  . Incisional breast biopsy     remoted left breast biopsy  . Cesarean section   . Other surgical history     GYN surgery  . Knee surgery     right knee surgery  . Carotid endarterectomy     right carotid  . Port-a-cath removal     reports that she quit smoking about 23 years ago. Her smoking use included Cigarettes. She has never used smokeless tobacco. She reports that she does not drink alcohol or use illicit drugs. family history includes Arthritis in an unspecified family member; Coronary artery disease in an unspecified family member; and Stroke in an unspecified family member. Allergies  Allergen Reactions  . Chlorhexidine Hives, Itching and Rash  . Codeine     REACTION: swelling      Review of Systems see hpi     Objective:   Physical Exam  Physical Exam  Nursing note and vitals  reviewed. Constitutional: Appears well-developed and well-nourished. No distress.  HENT:  Head: Normocephalic and atraumatic.  Right Ear: External ear normal.  Left Ear: External ear normal.  Eyes: Conjunctivae are normal. No scleral icterus.  Neck: Neck supple. Carotid bruit is not present.  Cardiovascular: Normal rate, regular rhythm and normal heart sounds.  Exam reveals no gallop and no friction rub.   No murmur heard. Pulmonary/Chest: Effort normal and breath sounds normal. No respiratory distress. He has no wheezes. no rales.  Lymphadenopathy:    He has no cervical adenopathy.  Neurological:Alert.  Skin: Skin is warm and dry. Not diaphoretic.  Psychiatric: Has a normal mood and affect.        Assessment & Plan:

## 2011-02-10 NOTE — Assessment & Plan Note (Signed)
Obtain tsh and ft4 

## 2011-02-10 NOTE — Assessment & Plan Note (Signed)
suboptimal but improving control. Resume metformin at 500mg  bid. Increase lantus 3 units q 3 days until am fasting glucose consistently less than 130 without hypoglycemia. Call fsbs report in 1-2 weeks. Obtain a1c and urine microalbumin

## 2011-02-15 ENCOUNTER — Other Ambulatory Visit: Payer: Self-pay | Admitting: Cardiology

## 2011-02-15 MED ORDER — CARVEDILOL 12.5 MG PO TABS: 12.5000 mg | ORAL_TABLET | Freq: Two times a day (BID) | ORAL | Status: DC

## 2011-02-17 ENCOUNTER — Encounter: Payer: Self-pay | Admitting: Surgery

## 2011-02-20 ENCOUNTER — Encounter: Payer: Self-pay | Admitting: *Deleted

## 2011-02-20 ENCOUNTER — Other Ambulatory Visit: Payer: Self-pay | Admitting: *Deleted

## 2011-02-20 ENCOUNTER — Ambulatory Visit (INDEPENDENT_AMBULATORY_CARE_PROVIDER_SITE_OTHER): Payer: Medicare Other | Admitting: Vascular Surgery

## 2011-02-20 ENCOUNTER — Other Ambulatory Visit: Payer: Self-pay | Admitting: Internal Medicine

## 2011-02-20 ENCOUNTER — Ambulatory Visit (INDEPENDENT_AMBULATORY_CARE_PROVIDER_SITE_OTHER): Payer: Medicare Other | Admitting: Surgery

## 2011-02-20 ENCOUNTER — Encounter: Payer: Self-pay | Admitting: Surgery

## 2011-02-20 DIAGNOSIS — I6529 Occlusion and stenosis of unspecified carotid artery: Secondary | ICD-10-CM

## 2011-02-20 DIAGNOSIS — E039 Hypothyroidism, unspecified: Secondary | ICD-10-CM

## 2011-02-20 DIAGNOSIS — Z48812 Encounter for surgical aftercare following surgery on the circulatory system: Secondary | ICD-10-CM

## 2011-02-20 DIAGNOSIS — E785 Hyperlipidemia, unspecified: Secondary | ICD-10-CM

## 2011-02-20 MED ORDER — LEVOTHYROXINE SODIUM 150 MCG PO TABS: ORAL_TABLET | ORAL | Status: DC

## 2011-02-20 MED ORDER — ATORVASTATIN CALCIUM 40 MG PO TABS: 40.0000 mg | ORAL_TABLET | Freq: Every day | ORAL | Status: DC

## 2011-02-20 NOTE — Progress Notes (Signed)
Addended by: Elita Quick E on: 02/20/2011 12:31 PM   Modules accepted: Orders

## 2011-02-20 NOTE — Progress Notes (Signed)
[facility] [place]   Patient name: Brittney Cunningham MRN: [id number] DOB: [birth year] Sex: female     Chief Complaint  Patient presents with  . Carotid    1 year follow up with vascular labs    HISTORY OF PRESENT ILLNESS: The patient comes in today for followup of her carotid occlusive disease. She is status post right carotid endarterectomy in April of thousand 11 for asymptomatic stenosis. We've been following a high-grade left carotid stenosis which a year ago was greater than 80%. Unfortunately the patient has been undergoing treatment for metastatic breast cancer and therefore we have not to proceed with intervention on the left side. The patient continues to be without symptoms. She is undergone a full course of chemotherapy and radiation therapy for breast cancer. She recently had a whole-body bone scan which is unchanged from a year ago. She has significantly decreased energy level after having undergone radiation therapy.  Past Medical History  Diagnosis Date  . Anemia   . Depression   . Diabetes mellitus type II   . GERD (gastroesophageal reflux disease)   . Hyperlipidemia   . Hypothyroidism   . Osteoarthritis   . Coronary artery stenosis     bilateral  . CAD (coronary artery disease)   . OSA (obstructive sleep apnea)   . Breast cancer     right breast cancer-invasive  ductal carcinoma (StageIV)  . Hypertension   . Anxiety   . Carotid artery occlusion     Past Surgical History  Procedure Date  . Incisional breast biopsy     remoted left breast biopsy  . Cesarean section   . Other surgical history     GYN surgery  . Knee surgery     right knee surgery  . Carotid endarterectomy     right carotid  . Port-a-cath removal   . Modified mastectomy     Right breast    History   Social History  . Marital Status: Married    Spouse Name: N/A    Number of Children: 3  . Years of Education: N/A   Occupational History  .     Social History  Main Topics  . Smoking status: Former Smoker    Types: Cigarettes    Quit date: 01/31/1988  . Smokeless tobacco: Never Used  . Alcohol Use: No  . Drug Use: No  . Sexually Active: Not on file   Other Topics Concern  . 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 Arthritis    . Coronary artery disease      first degree relative  . Stroke      first degree relative    Allergies as of 02/20/2011 - Review Complete 02/20/2011  Allergen Reaction Noted  . Chlorhexidine Hives, Itching, and Rash 01/05/2011  . Codeine      Current Outpatient Prescriptions on File Prior to Visit  Medication Sig Dispense Refill  . aspirin 81 MG tablet Take 81 mg by mouth daily.        . bisacodyl (DULCOLAX) 5 MG EC tablet Take 5 mg by mouth daily as needed.        . calcium carbonate (OS-CAL) 600 MG TABS Take 600 mg by mouth 2 (two) times daily with a meal.        . calcium carbonate (  TUMS - DOSED IN MG ELEMENTAL CALCIUM) 500 MG chewable tablet Chew 1 tablet by mouth 2 (two) times daily.        . carvedilol (COREG) 12.5 MG tablet Take 1 tablet (12.5 mg total) by mouth 2 (two) times daily.  60 tablet  1  . citalopram (CELEXA) 20 MG tablet Take 1 tablet (20 mg total) by mouth daily.  30 tablet  3  . cyanocobalamin (,VITAMIN B-12,) 1000 MCG/ML injection Inject 1,000 mcg into the muscle every 30 (thirty) days.        . hydrochlorothiazide (,MICROZIDE/HYDRODIURIL,) 12.5 MG capsule Take 1 capsule (12.5 mg total) by mouth every morning.  30 capsule  6  . ibuprofen (ADVIL) 200 MG tablet Take 200 mg by mouth 3 (three) times daily.        . insulin glargine (LANTUS) 100 UNIT/ML injection Inject 45 Units into the skin at bedtime. Office visit for future refills  40 mL  3  . letrozole (FEMARA) 2.5 MG tablet Take 1 tablet (2.5 mg total) by mouth daily.  30 tablet  1  . levothyroxine  (SYNTHROID) 150 MCG tablet Take 1 tablet (150 mcg total) by mouth daily.  30 tablet  3  . LORazepam (ATIVAN) 0.5 MG tablet Take 0.5 mg by mouth 2 (two) times daily.        Marland Kitchen losartan (COZAAR) 50 MG tablet Take 1 tablet (50 mg total) by mouth daily.  30 tablet  6  . metFORMIN (GLUCOPHAGE) 500 MG tablet Take 1 tablet (500 mg total) by mouth daily with breakfast.  30 tablet  6  . oxyCODONE-acetaminophen (PERCOCET) 5-325 MG per tablet Take 1 tablet by mouth every 6 (six) hours as needed for pain.  120 tablet  0  . simvastatin (ZOCOR) 40 MG tablet Take 1 tablet (40 mg total) by mouth at bedtime.  30 tablet  6     REVIEW OF SYSTEMS: Please see above history of present illness otherwise all negative  PHYSICAL EXAMINATION:   Vital signs are BP 112/64  Pulse 78  Resp 20  Ht 5\' 3"  (1.6 m)  Wt 260 lb (117.935 kg)  BMI 46.06 kg/m2  SpO2 98% General: The patient appears their stated age. HEENT:  No gross abnormalities Pulmonary:  Non labored breathing Musculoskeletal: There are no major deformities. Neurologic: No focal weakness or paresthesias are detected, Skin: There are no ulcer or rashes noted. Psychiatric: The patient has normal affect. Cardiovascular: There is a regular rate and rhythm without significant murmur appreciated.   Diagnostic Studies Ultrasound was performed today which shows no evidence of recurrent stenosis within the right carotid artery. Left-sided stenosis was initially measured as within the 1-39% stenosis however I had this study repeated and a repeat study showed 60-79% stenosis  Assessment: Carotid occlusive disease, asymptomatic Plan: By ultrasound today the patient's velocity profile seems to have decreased dramatically. I somewhat concerned that we're not able to repeat the same studies that we had over a year ago. Potentially she could have had some plaque progression with all the chemotherapy that she is to proceed. Regardless because she has a history of being  greater than 80% and because she is a somewhat difficult examination I recommended proceeding with carotid angiography to fully delineate her anatomy. This will also help me determine whether not she would be a carotid stent candidate if she does have greater than 80% stenosis. In addition we'll be able to quantitate her degree of stenosis so that we will no every  correlates with her ultrasound findings. This is been scheduled for Wednesday, January 30  V. Charlena Cross, M.D. [facility] [place] Office: [phone] Pager:  [phone]

## 2011-02-21 ENCOUNTER — Encounter (HOSPITAL_COMMUNITY): Payer: Self-pay | Admitting: Respiratory Therapy

## 2011-02-28 MED ORDER — SODIUM CHLORIDE 0.9 % IV SOLN
INTRAVENOUS | Status: DC
  Administered 2011-03-01: 09:00:00 via INTRAVENOUS

## 2011-03-01 ENCOUNTER — Ambulatory Visit (HOSPITAL_COMMUNITY)
Admission: RE | Admit: 2011-03-01 | Discharge: 2011-03-01 | Disposition: A | Discharge status: Home / Self Care | Payer: Medicare Other | Source: Ambulatory Visit | Attending: Surgery | Admitting: Surgery

## 2011-03-01 ENCOUNTER — Other Ambulatory Visit: Payer: Self-pay

## 2011-03-01 ENCOUNTER — Encounter (HOSPITAL_COMMUNITY)
Admission: RE | Disposition: A | Discharge status: Home / Self Care | Payer: Self-pay | Source: Ambulatory Visit | Attending: Surgery

## 2011-03-01 DIAGNOSIS — I658 Occlusion and stenosis of other precerebral arteries: Secondary | ICD-10-CM | POA: Insufficient documentation

## 2011-03-01 DIAGNOSIS — Z79899 Other long term (current) drug therapy: Secondary | ICD-10-CM | POA: Insufficient documentation

## 2011-03-01 DIAGNOSIS — M1990 Unspecified osteoarthritis, unspecified site: Secondary | ICD-10-CM | POA: Insufficient documentation

## 2011-03-01 DIAGNOSIS — I10 Essential (primary) hypertension: Secondary | ICD-10-CM | POA: Insufficient documentation

## 2011-03-01 DIAGNOSIS — Z853 Personal history of malignant neoplasm of breast: Secondary | ICD-10-CM | POA: Insufficient documentation

## 2011-03-01 DIAGNOSIS — Z794 Long term (current) use of insulin: Secondary | ICD-10-CM | POA: Insufficient documentation

## 2011-03-01 DIAGNOSIS — E119 Type 2 diabetes mellitus without complications: Secondary | ICD-10-CM | POA: Insufficient documentation

## 2011-03-01 DIAGNOSIS — K219 Gastro-esophageal reflux disease without esophagitis: Secondary | ICD-10-CM | POA: Insufficient documentation

## 2011-03-01 DIAGNOSIS — Z7982 Long term (current) use of aspirin: Secondary | ICD-10-CM | POA: Insufficient documentation

## 2011-03-01 DIAGNOSIS — F3289 Other specified depressive episodes: Secondary | ICD-10-CM | POA: Insufficient documentation

## 2011-03-01 DIAGNOSIS — E785 Hyperlipidemia, unspecified: Secondary | ICD-10-CM | POA: Insufficient documentation

## 2011-03-01 DIAGNOSIS — F329 Major depressive disorder, single episode, unspecified: Secondary | ICD-10-CM | POA: Insufficient documentation

## 2011-03-01 DIAGNOSIS — F411 Generalized anxiety disorder: Secondary | ICD-10-CM | POA: Insufficient documentation

## 2011-03-01 DIAGNOSIS — I6529 Occlusion and stenosis of unspecified carotid artery: Secondary | ICD-10-CM

## 2011-03-01 DIAGNOSIS — E039 Hypothyroidism, unspecified: Secondary | ICD-10-CM | POA: Insufficient documentation

## 2011-03-01 DIAGNOSIS — I2510 Atherosclerotic heart disease of native coronary artery without angina pectoris: Secondary | ICD-10-CM | POA: Insufficient documentation

## 2011-03-01 HISTORY — PX: ARCH AORTOGRAM: SHX5501

## 2011-03-01 HISTORY — PX: CAROTID ANGIOGRAM: SHX5504

## 2011-03-01 LAB — GLUCOSE, CAPILLARY: Glucose-Capillary: 167 mg/dL — ABNORMAL HIGH (ref 70–99)

## 2011-03-01 SURGERY — CAROTID ANGIOGRAM
Anesthesia: LOCAL

## 2011-03-01 MED ORDER — SODIUM CHLORIDE 0.9 % IV SOLN: 1.0000 mL/kg/h | INTRAVENOUS | Status: DC

## 2011-03-01 MED ORDER — HYDRALAZINE HCL 20 MG/ML IJ SOLN
INTRAMUSCULAR | Status: AC
  Filled 2011-03-01: qty 1

## 2011-03-01 MED ORDER — LIDOCAINE HCL (PF) 1 % IJ SOLN
INTRAMUSCULAR | Status: AC
  Filled 2011-03-01: qty 30

## 2011-03-01 MED ORDER — ACETAMINOPHEN 325 MG PO TABS: 650.0000 mg | ORAL_TABLET | ORAL | Status: DC | PRN

## 2011-03-01 MED ORDER — HEPARIN (PORCINE) IN NACL 2-0.9 UNIT/ML-% IJ SOLN
INTRAMUSCULAR | Status: AC
  Filled 2011-03-01: qty 1000

## 2011-03-01 MED ORDER — ONDANSETRON HCL 4 MG/2ML IJ SOLN: 4.0000 mg | Freq: Four times a day (QID) | INTRAMUSCULAR | Status: DC | PRN

## 2011-03-01 NOTE — Interval H&P Note (Signed)
--    [facility] [place]  History and Physical Update  The patient was interviewed and re-examined.  The patient's History and Physical has been reviewed and is unchanged except for: change in attending due to scheduling issues.  The plan is left carotid angiogram.  I discussed with the patient the nature of angiographic procedures, especially the limited patencies of any endovascular intervention.  The patient is aware of that the risks of an angiographic procedure include but are not limited to: bleeding, infection, access site complications, renal failure, embolization, rupture of vessel, dissection, possible need for emergent surgical intervention, possible need for surgical procedures to treat the patient's pathology, and stroke and death.  The patient is aware of the risks and agrees to proceed.  Leonides Sake, MD [facility] [place] Office: [phone] Pager: [phone]  03/01/2011, 12:07 PM

## 2011-03-01 NOTE — H&P (Signed)
[facility] [place]  Patient name: Brittney Cunningham MRN: [id number] DOB: 1940/10/13 Sex: female  Chief Complaint   Patient presents with   .  Carotid     1 year follow up with vascular labs    HISTORY OF PRESENT ILLNESS:  The patient comes in today for followup of her carotid occlusive disease. She is status post right carotid endarterectomy in April of thousand 11 for asymptomatic stenosis. We've been following a high-grade left carotid stenosis which a year ago was greater than 80%. Unfortunately the patient has been undergoing treatment for metastatic breast cancer and therefore we have not to proceed with intervention on the left side. The patient continues to be without symptoms. She is undergone a full course of chemotherapy and radiation therapy for breast cancer. She recently had a whole-body bone scan which is unchanged from a year ago. She has significantly decreased energy level after having undergone radiation therapy.  Past Medical History   Diagnosis  Date   .  Anemia    .  Depression    .  Diabetes mellitus type II    .  GERD (gastroesophageal reflux disease)    .  Hyperlipidemia    .  Hypothyroidism    .  Osteoarthritis    .  Coronary artery stenosis      bilateral   .  CAD (coronary artery disease)    .  OSA (obstructive sleep apnea)    .  Breast cancer      right breast cancer-invasive ductal carcinoma (StageIV)   .  Hypertension    .  Anxiety    .  Carotid artery occlusion     Past Surgical History   Procedure  Date   .  Incisional breast biopsy      remoted left breast biopsy   .  Cesarean section    .  Other surgical history      GYN surgery   .  Knee surgery      right knee surgery   .  Carotid endarterectomy      right carotid   .  Port-a-cath removal    .  Modified mastectomy      Right breast    History    Social History   .  Marital Status:  Married     Spouse Name:  N/A     Number of Children:  3   .  Years of  Education:  N/A    Occupational History   .      Social History Main Topics   .  Smoking status:  Former Smoker     Types:  Cigarettes     Quit date:  01/31/1988   .  Smokeless tobacco:  Never Used   .  Alcohol Use:  No   .  Drug Use:  No   .  Sexually Active:  Not on file    Other Topics  Concern   .  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  Arthritis     .  Coronary artery disease        first degree relative    .  Stroke        first degree relative    Allergies as of 02/20/2011 -  Review Complete 02/20/2011   Allergen  Reaction  Noted   .  Chlorhexidine  Hives, Itching, and Rash  01/05/2011   .  Codeine      Current Outpatient Prescriptions on File Prior to Visit   Medication  Sig  Dispense  Refill   .  aspirin 81 MG tablet  Take 81 mg by mouth daily.     .  bisacodyl (DULCOLAX) 5 MG EC tablet  Take 5 mg by mouth daily as needed.     .  calcium carbonate (OS-CAL) 600 MG TABS  Take 600 mg by mouth 2 (two) times daily with a meal.     .  calcium carbonate (TUMS - DOSED IN MG ELEMENTAL CALCIUM) 500 MG chewable tablet  Chew 1 tablet by mouth 2 (two) times daily.     .  carvedilol (COREG) 12.5 MG tablet  Take 1 tablet (12.5 mg total) by mouth 2 (two) times daily.  60 tablet  1   .  citalopram (CELEXA) 20 MG tablet  Take 1 tablet (20 mg total) by mouth daily.  30 tablet  3   .  cyanocobalamin (,VITAMIN B-12,) 1000 MCG/ML injection  Inject 1,000 mcg into the muscle every 30 (thirty) days.     .  hydrochlorothiazide (,MICROZIDE/HYDRODIURIL,) 12.5 MG capsule  Take 1 capsule (12.5 mg total) by mouth every morning.  30 capsule  6   .  ibuprofen (ADVIL) 200 MG tablet  Take 200 mg by mouth 3 (three) times daily.     .  insulin glargine (LANTUS) 100 UNIT/ML injection  Inject 45 Units into the skin at bedtime. Office visit for future refills  40  mL  3   .  letrozole (FEMARA) 2.5 MG tablet  Take 1 tablet (2.5 mg total) by mouth daily.  30 tablet  1   .  levothyroxine (SYNTHROID) 150 MCG tablet  Take 1 tablet (150 mcg total) by mouth daily.  30 tablet  3   .  LORazepam (ATIVAN) 0.5 MG tablet  Take 0.5 mg by mouth 2 (two) times daily.     Marland Kitchen  losartan (COZAAR) 50 MG tablet  Take 1 tablet (50 mg total) by mouth daily.  30 tablet  6   .  metFORMIN (GLUCOPHAGE) 500 MG tablet  Take 1 tablet (500 mg total) by mouth daily with breakfast.  30 tablet  6   .  oxyCODONE-acetaminophen (PERCOCET) 5-325 MG per tablet  Take 1 tablet by mouth every 6 (six) hours as needed for pain.  120 tablet  0   .  simvastatin (ZOCOR) 40 MG tablet  Take 1 tablet (40 mg total) by mouth at bedtime.  30 tablet  6    REVIEW OF SYSTEMS:  Please see above history of present illness otherwise all negative  PHYSICAL EXAMINATION:  Vital signs are BP 112/64  Pulse 78  Resp 20  Ht 5\' 3"  (1.6 m)  Wt 260 lb (117.935 kg)  BMI 46.06 kg/m2  SpO2 98%  General: The patient appears their stated age.  HEENT: No gross abnormalities  Pulmonary: Non labored breathing  Musculoskeletal: There are no major deformities.  Neurologic: No focal weakness or paresthesias are detected,  Skin: There are no ulcer or rashes noted.  Psychiatric: The patient has normal affect.  Cardiovascular: There is a regular rate and rhythm without significant murmur appreciated.  Diagnostic Studies  Ultrasound was performed today which shows no evidence of recurrent stenosis within the right carotid artery. Left-sided stenosis  was initially measured as within the 1-39% stenosis however I had this study repeated and a repeat study showed 60-79% stenosis  Assessment:  Carotid occlusive disease, asymptomatic  Plan:  By ultrasound today the patient's velocity profile seems to have decreased dramatically. I somewhat concerned that we're not able to repeat the same studies that we had over a year ago. Potentially  she could have had some plaque regression with all the chemotherapy that she received. Regardless because she has a history of being greater than 80% and because she is a somewhat difficult examination I recommended proceeding with carotid angiography to fully delineate her anatomy. This will also help me determine whether not she would be a carotid stent candidate if she does have greater than 80% stenosis. In addition we'll be able to quantitate her degree of stenosis so that we will be able to correlate with her future ultrasound findings. This is been scheduled for Wednesday, January 30   V. Charlena Cross, M.D.  [facility] [place]  Office: [phone]  Pager: [phone]

## 2011-03-01 NOTE — Op Note (Signed)
OPERATIVE NOTE   PROCEDURE: 1.  Right common femoral artery cannulation under ultrasound guidance 2.  Arch Aortogram 3.  Left common carotid artery selection 4.  Left carotid and cerebral angiogram  PRE-OPERATIVE DIAGNOSIS: possible left internal carotid artery stenosis > 80%  POST-OPERATIVE DIAGNOSIS: same as above   SURGEON: Leonides Sake, MD  ANESTHESIA: conscious sedation  ESTIMATED BLOOD LOSS: 30 cc  CONTRAST: 80 cc  FINDING(S):  Type I aortic arch  Patent innominate artery  Patent left common carotid artery with <30% orifical stenosis  Patent left subclavian artery   Patent external carotid artery   Patent internal carotid artery artery with web-like appearing stenosis >80% at the level of the angle of the mandible  SPECIMEN(S):  none  INDICATIONS:   Brittney Cunningham is a 71 y.o. female who presents with possible left internal carotid artery stenosis > 80%.  There was some discrepancies in the carotid duplexes, which require left carotid angiogram to evaluate the left carotid artery more completely.  I discussed with the patient the nature of angiographic procedures, especially the limited patencies of any endovascular intervention.  The patient is aware of that the risks of an angiographic procedure include but are not limited to: bleeding, infection, access site complications, renal failure, embolization, rupture of vessel, dissection, possible need for emergent surgical intervention, possible need for surgical procedures to treat the patient's pathology, and stroke and death.  The patient is aware of the risks and agrees to proceed.  DESCRIPTION: After full informed consent was obtained from the patient, the patient was brought back to the angiography suite.  The patient was placed supine upon the angiography table and connected to monitoring equipment.  The patient was not given conscious sedation due this being a carotid study.  The patient was prepped and drape in  the standard fashion for an angiographic procedure.  At this point, attention was turned to the right groin.  A pigtail catheter was then loaded over the wire up to the level of the aortic arch.  A 40 degree LAO arch aortogram was completed, demonstrating the above studies.  I then exchanged the catheter in descending aorta for a H1 catheter.  Using this, I selected out the left common carotid artery and advanced the catheter into proximal common carotid artery.  Due to problems with the power injector, we then completed hand injections in anterior-posterior and lateral projections to complete the left carotid and cerebral angiogram.  Multiple oblique neck injections were completed to image the left internal carotid artery.  On the RAO oblique, the web like stenosis in the internal carotid artery is best seen, which is >80%.  On lateral neck angiogram, the lesion is at the level of the angle of the mandible and may need best managed with carotid stenting if intervention if necessary.   The wire was replaced in the H1 catheter and the wire and catheter were removed.  The sheath was aspirated and no clot was noted.  The sheath was reloaded with heparinized saline.  COMPLICATIONS: none  CONDITION: stable  Leonides Sake, MD [facility] [place] Office: [phone] Pager: [phone]  03/01/2011, 1:42 PM

## 2011-03-02 ENCOUNTER — Telehealth: Payer: Self-pay | Admitting: *Deleted

## 2011-03-02 ENCOUNTER — Encounter: Payer: Medicare Other | Admitting: Oncology

## 2011-03-02 ENCOUNTER — Other Ambulatory Visit: Payer: Medicare Other | Admitting: Lab

## 2011-03-02 LAB — POCT I-STAT, CHEM 8
BUN: 40 mg/dL — ABNORMAL HIGH (ref 6–23)
Calcium, Ion: 1.21 mmol/L (ref 1.12–1.32)
Chloride: 111 mEq/L (ref 96–112)
Glucose, Bld: 241 mg/dL — ABNORMAL HIGH (ref 70–99)
HCT: 33 % — ABNORMAL LOW (ref 36.0–46.0)
TCO2: 21 mmol/L (ref 0–100)

## 2011-03-02 NOTE — Telephone Encounter (Signed)
patient confirmed over the phone the new date and time on 03-09-2011 starting at 2:00pm

## 2011-03-03 ENCOUNTER — Encounter: Payer: Self-pay | Admitting: Surgery

## 2011-03-06 ENCOUNTER — Ambulatory Visit (INDEPENDENT_AMBULATORY_CARE_PROVIDER_SITE_OTHER): Payer: Medicare Other | Admitting: Surgery

## 2011-03-06 ENCOUNTER — Encounter: Payer: Self-pay | Admitting: Surgery

## 2011-03-06 ENCOUNTER — Other Ambulatory Visit: Payer: Self-pay | Admitting: Oncology

## 2011-03-06 VITALS — BP 154/55 | HR 71 | Resp 16 | Ht 63.0 in | Wt 264.0 lb

## 2011-03-06 DIAGNOSIS — I6529 Occlusion and stenosis of unspecified carotid artery: Secondary | ICD-10-CM

## 2011-03-06 NOTE — Procedures (Unsigned)
CAROTID DUPLEX EXAM  INDICATION:  Carotid stenosis.  HISTORY: Diabetes:  Yes. Cardiac:  No. Hypertension:  Yes. Smoking:  Previous. Previous Surgery:  Right carotid endarterectomy, 05/28/2009. CV History:  Intermittent numbness, tingling of the right upper extremity. Amaurosis Fugax No, Paresthesias Yes, Hemiparesis No.                                      RIGHT             LEFT Brachial systolic pressure:         N/A               148 Brachial Doppler waveforms:         N/A               WNL Vertebral direction of flow:        Retrograde        Antegrade DUPLEX VELOCITIES (cm/sec) CCA peak systolic                   177               105 ECA peak systolic                   216               254 ICA peak systolic                   95                219/335 ICA end diastolic                   20                35/66 PLAQUE MORPHOLOGY:                  Heterogenous      Heterogenous PLAQUE AMOUNT:                      Mild              Moderate to severe PLAQUE LOCATION:                    CCA/ICA/ECA       CCA/ICA/ECA  IMPRESSION: 1. Right common carotid artery disease present. 2. Bilateral external carotid artery stenosis present. 3. Right internal carotid artery is patent with history of     endarterectomy, no hyperplasia identified with mild heterogenous     plaque present throughout. 4. Left internal carotid artery stenosis present in the 60% to 79%     range (high end of range, however, the percentage of stenosis may     be underestimated due to limited visualization from acoustic     shadowing). 5. Right vertebral is retrograde with history of right mastectomy in     February 2012. 6. Left vertebral artery is patent and antegrade. 7. Minimal change since previous study on 02/14/2010.  ___________________________________________ V. Charlena Cross, MD  SH/MEDQ  D:  02/20/2011  T:  02/20/2011  Job:  [id number]

## 2011-03-06 NOTE — Progress Notes (Signed)
The patient comes back today for followup of her left carotid stenosis. She remains asymptomatic. She recently had conflicting ultrasound studies and therefore I had her proceed with a left carotid angiogram. This confirmed greater than 80% left carotid stenosis. Her lesion was found to be very high. She comes in today to discuss her options.  I spent in excess of 45 minutes reviewing the patient's images and discussing our treatment options with her. We discussed carotid endarterectomy versus carotid stenting and the risks and benefits of each including stroke risk, nerve injury risk, and heart attack. We have elected to proceed with left carotid stenting with distal embolic protection however, she is due to see her oncologist later this week and based on their discussion we will plan accordingly. 

## 2011-03-07 ENCOUNTER — Other Ambulatory Visit: Payer: Self-pay

## 2011-03-07 DIAGNOSIS — C50919 Malignant neoplasm of unspecified site of unspecified female breast: Secondary | ICD-10-CM

## 2011-03-07 MED ORDER — LETROZOLE 2.5 MG PO TABS: 2.5000 mg | ORAL_TABLET | Freq: Every day | ORAL | Status: DC

## 2011-03-09 ENCOUNTER — Other Ambulatory Visit (HOSPITAL_BASED_OUTPATIENT_CLINIC_OR_DEPARTMENT_OTHER): Payer: Medicare Other | Admitting: Lab

## 2011-03-09 ENCOUNTER — Ambulatory Visit (HOSPITAL_BASED_OUTPATIENT_CLINIC_OR_DEPARTMENT_OTHER): Payer: Medicare Other | Admitting: Oncology

## 2011-03-09 ENCOUNTER — Other Ambulatory Visit: Payer: Self-pay | Admitting: *Deleted

## 2011-03-09 VITALS — BP 157/65 | HR 76 | Temp 98.4°F | Ht 63.0 in | Wt 266.4 lb

## 2011-03-09 DIAGNOSIS — K219 Gastro-esophageal reflux disease without esophagitis: Secondary | ICD-10-CM

## 2011-03-09 DIAGNOSIS — D649 Anemia, unspecified: Secondary | ICD-10-CM

## 2011-03-09 DIAGNOSIS — E785 Hyperlipidemia, unspecified: Secondary | ICD-10-CM

## 2011-03-09 DIAGNOSIS — C50919 Malignant neoplasm of unspecified site of unspecified female breast: Secondary | ICD-10-CM

## 2011-03-09 DIAGNOSIS — E119 Type 2 diabetes mellitus without complications: Secondary | ICD-10-CM

## 2011-03-09 DIAGNOSIS — R930 Abnormal findings on diagnostic imaging of skull and head, not elsewhere classified: Secondary | ICD-10-CM

## 2011-03-09 DIAGNOSIS — R9389 Abnormal findings on diagnostic imaging of other specified body structures: Secondary | ICD-10-CM

## 2011-03-09 DIAGNOSIS — F329 Major depressive disorder, single episode, unspecified: Secondary | ICD-10-CM

## 2011-03-09 DIAGNOSIS — E875 Hyperkalemia: Secondary | ICD-10-CM

## 2011-03-09 DIAGNOSIS — I6529 Occlusion and stenosis of unspecified carotid artery: Secondary | ICD-10-CM

## 2011-03-09 DIAGNOSIS — I10 Essential (primary) hypertension: Secondary | ICD-10-CM

## 2011-03-09 DIAGNOSIS — E039 Hypothyroidism, unspecified: Secondary | ICD-10-CM

## 2011-03-09 DIAGNOSIS — M1990 Unspecified osteoarthritis, unspecified site: Secondary | ICD-10-CM

## 2011-03-09 LAB — CBC WITH DIFFERENTIAL/PLATELET
Basophils Absolute: 0 10*3/uL (ref 0.0–0.1)
Eosinophils Absolute: 0.3 10*3/uL (ref 0.0–0.5)
HCT: 30.9 % — ABNORMAL LOW (ref 34.8–46.6)
HGB: 10 g/dL — ABNORMAL LOW (ref 11.6–15.9)
MONO#: 0.5 10*3/uL (ref 0.1–0.9)
NEUT%: 65.5 % (ref 38.4–76.8)
WBC: 7.5 10*3/uL (ref 3.9–10.3)
lymph#: 1.8 10*3/uL (ref 0.9–3.3)

## 2011-03-09 LAB — COMPREHENSIVE METABOLIC PANEL
AST: 10 U/L (ref 0–37)
Albumin: 3.7 g/dL (ref 3.5–5.2)
BUN: 34 mg/dL — ABNORMAL HIGH (ref 6–23)
CO2: 24 mEq/L (ref 19–32)
Calcium: 10.3 mg/dL (ref 8.4–10.5)
Chloride: 103 mEq/L (ref 96–112)
Glucose, Bld: 141 mg/dL — ABNORMAL HIGH (ref 70–99)
Potassium: 4.8 mEq/L (ref 3.5–5.3)

## 2011-03-09 LAB — CANCER ANTIGEN 27.29: CA 27.29: 99 U/mL — ABNORMAL HIGH (ref 0–39)

## 2011-03-09 MED ORDER — DENOSUMAB 120 MG/1.7ML ~~LOC~~ SOLN: 120.0000 mg | Freq: Once | SUBCUTANEOUS | Status: DC

## 2011-03-09 MED ORDER — CYANOCOBALAMIN 1000 MCG/ML IJ SOLN
1000.0000 ug | Freq: Once | INTRAMUSCULAR | Status: AC
  Administered 2011-03-09: 1000 ug via INTRAMUSCULAR

## 2011-03-09 MED ORDER — OXYCODONE-ACETAMINOPHEN 5-325 MG PO TABS: 1.0000 | ORAL_TABLET | Freq: Four times a day (QID) | ORAL | Status: DC | PRN

## 2011-03-09 NOTE — Progress Notes (Signed)
ID: Brittney Cunningham  DOB: May 24, 1940  MR#: [id number]  CSN#: [id number]   Interval History:   Brittney Cunningham returns today for followup of her stage IV breast cancer. The interval history is generally unremarkable. She enjoyed the holidays, and tells me her blood sugar is now under much better control, since she was started back on metformin.--- She saw Dr. Myra Cunningham and evaluation of her left carotid showed an 80% stenosis. The plan there is for stenting in the near future. He did want to make sure that due to his prognosis warranted that.  ROS:  Otherwise she describes herself is moderately fatigued. She has terrible problems with her knees which hurt all the time. She feels anxious and depressed. Today however she looks better than she has a sense started seeing her, more alert and "up". A detailed review of systems was otherwise stable.  Allergies  Allergen Reactions  . Chlorhexidine Hives, Itching and Rash  . Adhesive (Tape) Hives  . Codeine     REACTION: swelling    Current Outpatient Prescriptions  Medication Sig Dispense Refill  . aspirin 81 MG tablet Take 81 mg by mouth daily.        Marland Kitchen atorvastatin (LIPITOR) 40 MG tablet Take 40 mg by mouth daily.      . bisacodyl (DULCOLAX) 5 MG EC tablet Take 5 mg by mouth daily as needed. For constipation      . calcium carbonate (OS-CAL) 600 MG TABS Take 600 mg by mouth 2 (two) times daily with a meal.        . calcium carbonate (TUMS - DOSED IN MG ELEMENTAL CALCIUM) 500 MG chewable tablet Chew 1 tablet by mouth 2 (two) times daily. Take 1 day before and 1 day after chemo treatment      . carvedilol (COREG) 12.5 MG tablet Take 12.5 mg by mouth 2 (two) times daily with a meal.      . citalopram (CELEXA) 20 MG tablet Take 20 mg by mouth daily.      . cyanocobalamin (,VITAMIN B-12,) 1000 MCG/ML injection Inject 1,000 mcg into the muscle every 30 (thirty) days.        Marland Kitchen denosumab (XGEVA) 120 MG/1.7ML SOLN Inject 120 mg into the skin once.      .  hydrochlorothiazide (MICROZIDE) 12.5 MG capsule Take 12.5 mg by mouth daily.      Marland Kitchen ibuprofen (ADVIL) 200 MG tablet Take 200 mg by mouth 3 (three) times daily.        . insulin glargine (LANTUS) 100 UNIT/ML injection Inject 50 Units into the skin at bedtime.      Marland Kitchen letrozole (FEMARA) 2.5 MG tablet Take 1 tablet (2.5 mg total) by mouth daily.  30 tablet  0  . levothyroxine (SYNTHROID, LEVOTHROID) 150 MCG tablet Take 150-225 mcg by mouth daily. Take 1.5 tablets on mondays and 1 tablet on the rest of the week      . LORazepam (ATIVAN) 0.5 MG tablet Take 0.5 mg by mouth 2 (two) times daily.        Marland Kitchen losartan (COZAAR) 50 MG tablet Take 50 mg by mouth daily.      . metFORMIN (GLUCOPHAGE) 500 MG tablet Take 500 mg by mouth daily with breakfast.      . oxyCODONE-acetaminophen (PERCOCET) 5-325 MG per tablet Take 1 tablet by mouth every 6 (six) hours as needed. For pain      . 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  History of hypercholesterolemia, history of osteoarthritis.  FAMILY HISTORY:  The patient's father died from pancreatic cancer at the age of 9.  The patient's mother died from complications of diabetes and heart disease at the age of 69.  The patient has 2 sisters and 2 brothers.  There is no history of breast cancer or ovarian cancer in the immediate family.  One of the patient's paternal aunts out of 6 paternal aunts had breast cancer diagnosed in her 56s.   GYNECOLOGIC HISTORY:  The patient is GX, P3.  First pregnancy to term age 70.  She went  through the change of life in her late 67s.  She never took hormones.  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  Her husband of 43 years, Brittney Cunningham, is disabled secondary to a fall.  He has difficulty with walking.  He used to work for [employer] previously.  The patient's Cunningham, Brittney Cunningham, is present today.  He lives in [place], and works for [employer]  Daughter, Brittney Cunningham, 68 years old, lives in [place], and works for [employer].  Daughter, Brittney Cunningham, 47, lives in [place], [place], and works as a Statistician.  The patient attends [employer].  Objective: Middle-aged white woman who appears comfortable  Filed Vitals:   03/09/11 1435  BP: 157/65  Pulse: 76  Temp: 98.4 F (36.9 C)    BMI: Body mass index is 47.19 kg/(m^2).   ECOG FS: 2  Physical Exam:   Sclerae unicteric  Oropharynx clear; she has a faint bruit in the left upper neck just below the ear  No peripheral adenopathy  Lungs clear -- no rales or rhonchi  Heart regular rate and rhythm  Abdomen benign  MSK scoliosis but no focal spinal tenderness  Neuro nonfocal  Breast exam: Right breast is status post modified radical mastectomy. The right axilla remains mildly erythematous, but it has largely healed over, and there is only a 2 x 1 cm area of very superficial erosion. There is no evidence of infection. The left breast is unremarkable  Lab Results:      Chemistry      Component Value Date/Time   NA 137 03/01/2011 0845   K 5.1 03/01/2011 0845   CL 111 03/01/2011 0845   CO2 24 02/02/2011 0918   BUN 40* 03/01/2011 0845   CREATININE 1.20* 03/01/2011 0845      Component Value Date/Time   CALCIUM 10.3 02/02/2011 0918   ALKPHOS 105 02/02/2011 0918   AST 8 02/02/2011 0918   ALT 10 02/02/2011 0918   BILITOT 0.2* 02/02/2011 0918       Lab Results  Component Value Date   WBC 7.5 03/09/2011   HGB 10.0* 03/09/2011   HCT 30.9* 03/09/2011   MCV 91.2 03/09/2011   PLT 235 03/09/2011    NEUTROABS 4.9 03/09/2011    Studies/Results:  She had carotid dopplers 02/20/2011 followed by a Left carotid angiogram which confirmed greater than 80% left carotid stenosis. Next mammogram due Oct 2013. Most recent bone scan 11/16/2010 was stable  Assessment:A 71 year old  [place] woman   (1) status post right breast biopsy May of 2011 for a grade 2 invasive ductal carcinoma, T2 NX M1, Stage IV,  with bone-only involvement, strongly estrogen and progesterone receptor-positive, HER-2-negative with an MIB-1 of 26%.   (2)  Neoadjuvantly she received letrozole and zoledronic acid beginning in June of 2011 and underwent right modified radical mastectomy February of 2012 for a ypT2 ypN2, grade 1 invasive ductal carcinoma with negative margins.  (3)  She completed radiation therapy in August of 2012.    (4) She has continued on letrozole but was switched from zoledronic acid to [facility] because of concerns regarding her serum creatinine, which is now again normalizing.    (5) She receives B12 injections monthly.      Plan: we are continuing the denosumab every 8 weeks and B12 every 4 weeks. She is tolerating the letrozole well. She will see [place] for routine follow-up March 7th. She knows to call for any problems that may develop[p before next visit        MAGRINAT,GUSTAV C 03/09/2011

## 2011-03-09 NOTE — Telephone Encounter (Signed)
gve the pt her march 2013 appt calendar. Pt is aware to pick up the rest of her appt calendars at her next visit

## 2011-03-10 ENCOUNTER — Encounter (HOSPITAL_COMMUNITY): Payer: Self-pay | Admitting: Pharmacy Technician

## 2011-03-10 ENCOUNTER — Other Ambulatory Visit: Payer: Self-pay

## 2011-03-10 ENCOUNTER — Telehealth: Payer: Self-pay

## 2011-03-10 DIAGNOSIS — I6522 Occlusion and stenosis of left carotid artery: Secondary | ICD-10-CM

## 2011-03-10 MED ORDER — CLOPIDOGREL BISULFATE 75 MG PO TABS: 75.0000 mg | ORAL_TABLET | Freq: Every day | ORAL | Status: DC

## 2011-03-10 NOTE — Telephone Encounter (Signed)
Pt. Called to schedule carotid stent.  Pt. Will be scheduled for Wed., 03/15/11.  Discussed with Dr. Myra Gianotti need for pt. To start Plavix.  Rec'd verbal order to start Plavix 75 mg po., qd.  Instructed pt. To start Plavix today and take dose everyday, including am of procedure.  Other pre-procedure instructions given.  Pt. Verbalized understanding.

## 2011-03-15 ENCOUNTER — Encounter (HOSPITAL_COMMUNITY)
Admission: RE | Disposition: A | Discharge status: Home / Self Care | Payer: Self-pay | Source: Ambulatory Visit | Attending: Surgery

## 2011-03-15 ENCOUNTER — Encounter (HOSPITAL_COMMUNITY): Payer: Self-pay | Admitting: *Deleted

## 2011-03-15 ENCOUNTER — Inpatient Hospital Stay (HOSPITAL_COMMUNITY)
Admission: RE | Admit: 2011-03-15 | Discharge: 2011-03-16 | DRG: 036 | Disposition: A | Discharge status: Home / Self Care | Payer: Medicare Other | Source: Ambulatory Visit | Attending: Surgery | Admitting: Surgery

## 2011-03-15 DIAGNOSIS — G4733 Obstructive sleep apnea (adult) (pediatric): Secondary | ICD-10-CM | POA: Diagnosis present

## 2011-03-15 DIAGNOSIS — C50919 Malignant neoplasm of unspecified site of unspecified female breast: Secondary | ICD-10-CM

## 2011-03-15 DIAGNOSIS — E039 Hypothyroidism, unspecified: Secondary | ICD-10-CM | POA: Diagnosis present

## 2011-03-15 DIAGNOSIS — I6529 Occlusion and stenosis of unspecified carotid artery: Principal | ICD-10-CM

## 2011-03-15 DIAGNOSIS — Z7902 Long term (current) use of antithrombotics/antiplatelets: Secondary | ICD-10-CM

## 2011-03-15 DIAGNOSIS — F329 Major depressive disorder, single episode, unspecified: Secondary | ICD-10-CM | POA: Diagnosis present

## 2011-03-15 DIAGNOSIS — Z794 Long term (current) use of insulin: Secondary | ICD-10-CM

## 2011-03-15 DIAGNOSIS — E119 Type 2 diabetes mellitus without complications: Secondary | ICD-10-CM | POA: Diagnosis present

## 2011-03-15 DIAGNOSIS — E875 Hyperkalemia: Secondary | ICD-10-CM | POA: Diagnosis present

## 2011-03-15 DIAGNOSIS — Z79899 Other long term (current) drug therapy: Secondary | ICD-10-CM

## 2011-03-15 DIAGNOSIS — K219 Gastro-esophageal reflux disease without esophagitis: Secondary | ICD-10-CM | POA: Diagnosis present

## 2011-03-15 DIAGNOSIS — E785 Hyperlipidemia, unspecified: Secondary | ICD-10-CM | POA: Diagnosis present

## 2011-03-15 DIAGNOSIS — I10 Essential (primary) hypertension: Secondary | ICD-10-CM | POA: Diagnosis present

## 2011-03-15 DIAGNOSIS — D649 Anemia, unspecified: Secondary | ICD-10-CM

## 2011-03-15 DIAGNOSIS — F3289 Other specified depressive episodes: Secondary | ICD-10-CM | POA: Diagnosis present

## 2011-03-15 HISTORY — PX: CAROTID STENT INSERTION: SHX5505

## 2011-03-15 LAB — GLUCOSE, CAPILLARY
Glucose-Capillary: 116 mg/dL — ABNORMAL HIGH (ref 70–99)
Glucose-Capillary: 119 mg/dL — ABNORMAL HIGH (ref 70–99)
Glucose-Capillary: 268 mg/dL — ABNORMAL HIGH (ref 70–99)

## 2011-03-15 LAB — POCT I-STAT, CHEM 8
BUN: 30 mg/dL — ABNORMAL HIGH (ref 6–23)
Creatinine, Ser: 1 mg/dL (ref 0.50–1.10)
Glucose, Bld: 153 mg/dL — ABNORMAL HIGH (ref 70–99)
Hemoglobin: 10.5 g/dL — ABNORMAL LOW (ref 12.0–15.0)
Sodium: 140 mEq/L (ref 135–145)
TCO2: 21 mmol/L (ref 0–100)

## 2011-03-15 LAB — CARDIAC PANEL(CRET KIN+CKTOT+MB+TROPI)
CK, MB: 2.4 ng/mL (ref 0.3–4.0)
Troponin I: <0.3 ng/mL (ref <0.30)

## 2011-03-15 SURGERY — CAROTID STENT INSERTION
Anesthesia: LOCAL | Laterality: Left

## 2011-03-15 MED ORDER — OXYCODONE-ACETAMINOPHEN 5-325 MG PO TABS
1.0000 | ORAL_TABLET | ORAL | Status: DC | PRN
  Administered 2011-03-15 (×2): 1 via ORAL
  Filled 2011-03-15: qty 1

## 2011-03-15 MED ORDER — SIMVASTATIN 20 MG PO TABS
20.0000 mg | ORAL_TABLET | Freq: Every day | ORAL | Status: DC
  Filled 2011-03-15 (×2): qty 1

## 2011-03-15 MED ORDER — INSULIN GLARGINE 100 UNIT/ML ~~LOC~~ SOLN
60.0000 [IU] | Freq: Every day | SUBCUTANEOUS | Status: DC
  Administered 2011-03-15: 60 [IU] via SUBCUTANEOUS
  Filled 2011-03-15: qty 3

## 2011-03-15 MED ORDER — DENOSUMAB 120 MG/1.7ML ~~LOC~~ SOLN: 120.0000 mg | SUBCUTANEOUS | Status: DC

## 2011-03-15 MED ORDER — ASPIRIN 81 MG PO CHEW: 81.0000 mg | CHEWABLE_TABLET | Freq: Every day | ORAL | Status: DC

## 2011-03-15 MED ORDER — CLOPIDOGREL BISULFATE 75 MG PO TABS
75.0000 mg | ORAL_TABLET | Freq: Every day | ORAL | Status: DC
  Administered 2011-03-16: 75 mg via ORAL
  Filled 2011-03-15 (×2): qty 1

## 2011-03-15 MED ORDER — NOREPINEPHRINE BITARTRATE 1 MG/ML IJ SOLN
INTRAMUSCULAR | Status: AC
  Filled 2011-03-15: qty 4

## 2011-03-15 MED ORDER — LOSARTAN POTASSIUM 50 MG PO TABS
50.0000 mg | ORAL_TABLET | Freq: Every day | ORAL | Status: DC
  Administered 2011-03-16: 50 mg via ORAL
  Filled 2011-03-15 (×2): qty 1

## 2011-03-15 MED ORDER — LEVOTHYROXINE SODIUM 150 MCG PO TABS
150.0000 ug | ORAL_TABLET | ORAL | Status: DC
Start: 2011-03-16 — End: 2011-03-16
  Administered 2011-03-16: 150 ug via ORAL
  Filled 2011-03-15 (×2): qty 1

## 2011-03-15 MED ORDER — CALCIUM CARBONATE ANTACID 500 MG PO CHEW
1.0000 | CHEWABLE_TABLET | Freq: Two times a day (BID) | ORAL | Status: DC
  Administered 2011-03-15 – 2011-03-16 (×2): 200 mg via ORAL
  Filled 2011-03-15 (×3): qty 1

## 2011-03-15 MED ORDER — ONDANSETRON HCL 4 MG/2ML IJ SOLN
4.0000 mg | Freq: Four times a day (QID) | INTRAMUSCULAR | Status: DC | PRN
  Filled 2011-03-15: qty 2

## 2011-03-15 MED ORDER — DEXTROSE 5 % IV SOLN
INTRAVENOUS | Status: AC
  Filled 2011-03-15: qty 250

## 2011-03-15 MED ORDER — OXYCODONE-ACETAMINOPHEN 5-325 MG PO TABS
ORAL_TABLET | ORAL | Status: AC
  Filled 2011-03-15: qty 1

## 2011-03-15 MED ORDER — SODIUM CHLORIDE 0.9 % IJ SOLN: 3.0000 mL | INTRAMUSCULAR | Status: DC | PRN

## 2011-03-15 MED ORDER — LETROZOLE 2.5 MG PO TABS
2.5000 mg | ORAL_TABLET | Freq: Every day | ORAL | Status: DC
  Filled 2011-03-15 (×2): qty 1

## 2011-03-15 MED ORDER — HEPARIN (PORCINE) IN NACL 2-0.9 UNIT/ML-% IJ SOLN
INTRAMUSCULAR | Status: AC
  Filled 2011-03-15: qty 1000

## 2011-03-15 MED ORDER — LIDOCAINE HCL (PF) 1 % IJ SOLN
INTRAMUSCULAR | Status: AC
  Filled 2011-03-15: qty 30

## 2011-03-15 MED ORDER — HYDROCHLOROTHIAZIDE 12.5 MG PO CAPS
12.5000 mg | ORAL_CAPSULE | Freq: Every day | ORAL | Status: DC
  Administered 2011-03-16: 12.5 mg via ORAL
  Filled 2011-03-15 (×2): qty 1

## 2011-03-15 MED ORDER — SODIUM CHLORIDE 0.9 % IJ SOLN
INTRAMUSCULAR | Status: AC
  Filled 2011-03-15: qty 20

## 2011-03-15 MED ORDER — ATROPINE SULFATE 1 MG/ML IJ SOLN
INTRAMUSCULAR | Status: AC
  Filled 2011-03-15: qty 1

## 2011-03-15 MED ORDER — METFORMIN HCL 500 MG PO TABS: 500.0000 mg | ORAL_TABLET | Freq: Every day | ORAL | Status: DC

## 2011-03-15 MED ORDER — SODIUM CHLORIDE 0.9 % IV SOLN
0.2500 mg/kg/h | INTRAVENOUS | Status: DC
  Filled 2011-03-15: qty 250

## 2011-03-15 MED ORDER — OXYCODONE-ACETAMINOPHEN 5-325 MG PO TABS
1.0000 | ORAL_TABLET | Freq: Three times a day (TID) | ORAL | Status: DC | PRN
  Administered 2011-03-16: 1 via ORAL
  Filled 2011-03-15: qty 1

## 2011-03-15 MED ORDER — SODIUM CHLORIDE 0.9 % IV SOLN: 1.0000 mL/kg/h | INTRAVENOUS | Status: AC

## 2011-03-15 MED ORDER — LEVOTHYROXINE SODIUM 25 MCG PO TABS: 225.0000 ug | ORAL_TABLET | ORAL | Status: DC

## 2011-03-15 MED ORDER — ASPIRIN 81 MG PO CHEW
81.0000 mg | CHEWABLE_TABLET | Freq: Once | ORAL | Status: AC
  Administered 2011-03-15: 81 mg via ORAL
  Filled 2011-03-15: qty 1

## 2011-03-15 MED ORDER — CARVEDILOL 12.5 MG PO TABS
12.5000 mg | ORAL_TABLET | Freq: Two times a day (BID) | ORAL | Status: DC
  Administered 2011-03-16: 12.5 mg via ORAL
  Filled 2011-03-15 (×4): qty 1

## 2011-03-15 MED ORDER — SIMVASTATIN 40 MG PO TABS
40.0000 mg | ORAL_TABLET | Freq: Every evening | ORAL | Status: DC
  Filled 2011-03-15: qty 1

## 2011-03-15 MED ORDER — LEVOTHYROXINE SODIUM 150 MCG PO TABS
150.0000 ug | ORAL_TABLET | Freq: Every day | ORAL | Status: DC
  Filled 2011-03-15: qty 1.5

## 2011-03-15 MED ORDER — BIVALIRUDIN 250 MG IV SOLR
INTRAVENOUS | Status: AC
  Filled 2011-03-15: qty 250

## 2011-03-15 MED ORDER — BISACODYL 5 MG PO TBEC: 5.0000 mg | DELAYED_RELEASE_TABLET | Freq: Every day | ORAL | Status: DC | PRN

## 2011-03-15 MED ORDER — CYANOCOBALAMIN 1000 MCG/ML IJ SOLN: 1000.0000 ug | INTRAMUSCULAR | Status: DC

## 2011-03-15 MED ORDER — SODIUM CHLORIDE 0.9 % IV SOLN
INTRAVENOUS | Status: DC
  Administered 2011-03-15: 08:00:00 via INTRAVENOUS

## 2011-03-15 MED ORDER — IBUPROFEN 200 MG PO TABS
200.0000 mg | ORAL_TABLET | Freq: Three times a day (TID) | ORAL | Status: DC
  Administered 2011-03-15: 200 mg via ORAL
  Filled 2011-03-15 (×5): qty 1

## 2011-03-15 MED ORDER — CITALOPRAM HYDROBROMIDE 20 MG PO TABS
20.0000 mg | ORAL_TABLET | Freq: Every day | ORAL | Status: DC
  Administered 2011-03-16: 20 mg via ORAL
  Filled 2011-03-15 (×2): qty 1

## 2011-03-15 MED ORDER — ACETAMINOPHEN 325 MG PO TABS: 650.0000 mg | ORAL_TABLET | ORAL | Status: DC | PRN

## 2011-03-15 MED ORDER — METFORMIN HCL 500 MG PO TABS
500.0000 mg | ORAL_TABLET | Freq: Every day | ORAL | Status: DC
  Filled 2011-03-15: qty 1

## 2011-03-15 MED ORDER — LORAZEPAM 0.5 MG PO TABS
0.5000 mg | ORAL_TABLET | Freq: Two times a day (BID) | ORAL | Status: DC
  Administered 2011-03-15: 0.5 mg via ORAL
  Filled 2011-03-15 (×2): qty 1

## 2011-03-15 MED ORDER — CALCIUM CARBONATE 600 MG PO TABS
600.0000 mg | ORAL_TABLET | Freq: Two times a day (BID) | ORAL | Status: DC
  Filled 2011-03-15 (×2): qty 1

## 2011-03-15 NOTE — Op Note (Signed)
[facility] [place]  Patient name: Brittney Cunningham MRN: [id number] DOB: [birth year] Sex: female  03/15/2011 Pre-operative Diagnosis: Asymptomatic left carotid stenosis Post-operative diagnosis:  Same Surgeon:  Jorge Ny,  Nanetta Batty Procedure Performed:  1.  left carotid stenting with distal embolic protection     Indications:  I have been following the patient for a high-grade left carotid stenosis. She is previously undergone right carotid endarterectomy. Her stenosis in the left carotid artery has been found to greater than 80% however we have been treating this medically do to the patient undergoing treatment for metastatic breast cancer. She has completed her treatment and has been given a good prognosis. She is brought in for diagnostic images 2 weeks ago and was found to have a very high lesion within her internal carotid artery which correlated to approximately 80% stenosis. I felt she was a high-risk operative candidate due to her anatomy and therefore recommended carotid stenting.  Procedure:  The patient was identified in the holding area and taken to room 8.  The patient was then placed supine on the table and prepped and draped in the usual sterile fashion.  A time out was called.  Ultrasound was used to evaluate the right common femoral artery.  It was patent .  A digital ultrasound image was acquired.  A micropuncture needle was used to access the right common femoral artery under ultrasound guidance.  An 018 wire was advanced without resistance and a micropuncture sheath was placed.  The 018 wire was removed and a benson wire was placed.  The micropuncture sheath was exchanged for a 5 french sheath.  I had told getting a 5 French sheath and and therefore used a KMP catheter. With that I was able to navigate a Amplatz superstiff wire into the aorta. I then placed a 6 French long sheath into the descending thoracic aorta. Angiomax was initiated. The  levels were confirmed with activated clotting times. Next using the JB 1 shuttle sheath the left internal carotid artery was selected. Diagnostic preintervention images were obtained of the neck and intracranial. The intracranial images will be separately interpreted by the neuroradiology. Once we confirmed that the ACT was greater than 250 the sheath was advanced over the shuttle sheath into the midportion of the left common carotid artery. A Cordis Angioguard RX 7 mm filter was prepared on the back table and then advanced across the stenosis and fully deployed. Next the lesion was predilated using a 3 mm balloon. The stent was then prepared on the back table this was a Cordis 9 x 30 precise pro-Rx. It was advanced across the stenosis without difficulty and deployed in the internal carotid artery distally and common carotid artery proximally. The stenosis was then molded with a 5 x 2 balloon. Completion images were then performed. The patient had an excellent result with stenosis from 80% down to less than 10%. She remained neurologically intact. Completion images were performed the filter was retrieved with the retrieval catheter. The long 6 French sheath was exchanged out for a short 6 [place] sheath. Neurological exam was confirmed to be intact. She'll be taken the holding area for sheath pull once her coagulation profile corrects.     Impression:  #1  successful left carotid stenting using a 9 x 30 Cordis stent and a 7 mm filter   V. Durene Cal, M.D. [facility] [place] Office: [phone] Pager:  [phone]

## 2011-03-15 NOTE — H&P (View-Only) (Signed)
The patient comes back today for followup of her left carotid stenosis. She remains asymptomatic. She recently had conflicting ultrasound studies and therefore I had her proceed with a left carotid angiogram. This confirmed greater than 80% left carotid stenosis. Her lesion was found to be very high. She comes in today to discuss her options.  I spent in excess of 45 minutes reviewing the patient's images and discussing our treatment options with her. We discussed carotid endarterectomy versus carotid stenting and the risks and benefits of each including stroke risk, nerve injury risk, and heart attack. We have elected to proceed with left carotid stenting with distal embolic protection however, she is due to see her oncologist later this week and based on their discussion we will plan accordingly.

## 2011-03-15 NOTE — Research (Signed)
[employer]  Informed Consent   Subject Name: Brittney Cunningham  Subject met inclusion and exclusion criteria.  The informed consent form, study requirements and expectations were reviewed with the subject and questions and concerns were addressed prior to the signing of the consent form.  The subject verbalized understanding of the trail requirements.  The subject agreed to participate in the [facility] trial and signed the informed consent.  The informed consent was obtained prior to performance of any protocol-specific procedures for the subject.  A copy of the signed informed consent was given to the subject and a copy was placed in the subject's medical record.  Brunilda Payor 03/15/2011, 08:25

## 2011-03-15 NOTE — Interval H&P Note (Signed)
History and Physical Interval Note:  03/15/2011 7:46 AM  Brittney Cunningham  has presented today for surgery, with the diagnosis of left carotid stenosis  The various methods of treatment have been discussed with the patient and family. After consideration of risks, benefits and other options for treatment, the patient has consented to  Procedure(s) (LRB): CAROTID STENT INSERTION (Left) as a surgical intervention .  The patients' history has been reviewed, patient examined, no change in status, stable for surgery.  I have reviewed the patients' chart and labs.  Questions were answered to the patient's satisfaction.     BRABHAM IV, V. WELLS

## 2011-03-16 LAB — BASIC METABOLIC PANEL
CO2: 24 mEq/L (ref 19–32)
Chloride: 105 mEq/L (ref 96–112)
Glucose, Bld: 138 mg/dL — ABNORMAL HIGH (ref 70–99)
Sodium: 135 mEq/L (ref 135–145)

## 2011-03-16 LAB — CBC
Hemoglobin: 8.7 g/dL — ABNORMAL LOW (ref 12.0–15.0)
MCV: 93.1 fL (ref 78.0–100.0)
Platelets: 217 10*3/uL (ref 150–400)
RBC: 2.91 MIL/uL — ABNORMAL LOW (ref 3.87–5.11)
WBC: 6.3 10*3/uL (ref 4.0–10.5)

## 2011-03-16 MED ORDER — HEPARIN SOD (PORK) LOCK FLUSH 100 UNIT/ML IV SOLN
500.0000 [IU] | INTRAVENOUS | Status: AC | PRN
  Administered 2011-03-16: 500 [IU]

## 2011-03-16 MED ORDER — OXYCODONE-ACETAMINOPHEN 5-325 MG PO TABS: 1.0000 | ORAL_TABLET | Freq: Three times a day (TID) | ORAL | Status: DC | PRN

## 2011-03-16 MED FILL — Dextrose Inj 5%: INTRAVENOUS | Qty: 50 | Status: AC

## 2011-03-16 NOTE — Plan of Care (Signed)
Problem: Consults Goal: Diagnosis CEA/CES/AAA Stent Outcome: Completed/Met Date Met:  03/16/11 Carotid Endarterectomy (CEA)

## 2011-03-16 NOTE — Progress Notes (Signed)
[facility] Progress Note  03/16/2011 8:10 AM POD 1  Subjective:  No complaints and feels well.   Filed Vitals:   03/16/11 0423  BP: 128/43  Pulse: 63  Temp: 98.4 F (36.9 C)  Resp: 18    Physical Exam: Incisions:  Right groin with some ecchymosis, but soft. Extremities:  Moves all extremities; grip equal bilaterally Neuro: in tact without deficit.  CBC    Component Value Date/Time   WBC 6.3 03/16/2011 0416   WBC 7.5 03/09/2011 1358   RBC 2.91* 03/16/2011 0416   RBC 3.39* 03/09/2011 1358   HGB 8.7* 03/16/2011 0416   HGB 10.0* 03/09/2011 1358   HCT 27.1* 03/16/2011 0416   HCT 30.9* 03/09/2011 1358   PLT 217 03/16/2011 0416   PLT 235 03/09/2011 1358   MCV 93.1 03/16/2011 0416   MCV 91.2 03/09/2011 1358   MCH 29.9 03/16/2011 0416   MCH 29.5 03/09/2011 1358   MCHC 32.1 03/16/2011 0416   MCHC 32.4 03/09/2011 1358   RDW 14.6 03/16/2011 0416   RDW 14.3 03/09/2011 1358   LYMPHSABS 1.8 03/09/2011 1358   LYMPHSABS 2.7 03/01/2010 0848   MONOABS 0.5 03/09/2011 1358   MONOABS 0.6 03/01/2010 0848   EOSABS 0.3 03/09/2011 1358   EOSABS 0.3 03/01/2010 0848   BASOSABS 0.0 03/09/2011 1358   BASOSABS 0.0 03/01/2010 0848    BMET    Component Value Date/Time   NA 135 03/16/2011 0416   K 4.2 03/16/2011 0416   CL 105 03/16/2011 0416   CO2 24 03/16/2011 0416   GLUCOSE 138* 03/16/2011 0416   BUN 29* 03/16/2011 0416   CREATININE 1.39* 03/16/2011 0416   CALCIUM 8.2* 03/16/2011 0416   GFRNONAA 38* 03/16/2011 0416   GFRAA 44* 03/16/2011 0416    INR    Component Value Date/Time   INR 1.08 07/19/2009 1201     Intake/Output Summary (Last 24 hours) at 03/16/11 0810 Last data filed at 03/16/11 0100  Gross per 24 hour  Intake    360 ml  Output    700 ml  Net   -340 ml     Assessment/Plan:  71 y.o. female is s/p Left Carotid artery stent POD 1 -doing well; home this am. -f/u with Dr. Myra Gianotti in 2 weeks. -has ambulated this am.  Newton Pigg, PA-C [facility] [phone] 03/16/2011 8:10 AM

## 2011-03-17 LAB — GLUCOSE, CAPILLARY

## 2011-03-17 NOTE — Discharge Summary (Signed)
[facility] Discharge Summary  Brittney Cunningham July 18, 1940 71 y.o. female  [id number]  Admission Date: 03/15/2011  Discharge Date: 03/16/11  Physician: Dr. Myra Gianotti  Admission Diagnosis: left carotid stenosis   HPI:   This is a 71 y.o. female who has presented today for surgery, with the diagnosis of left carotid stenosis The various methods of treatment have been discussed with the patient and family. After consideration of risks, benefits and other options for treatment, the patient has consented to Procedure(s) (LRB):  CAROTID STENT INSERTION (Left) as a surgical intervention . The patients' history has been reviewed, patient examined, no change in status, stable for surgery. I have reviewed the patients' chart and labs. Questions were answered to the patient's satisfaction.       Hospital Course:  The patient was admitted to the hospital and taken to the operating room on 03/15/2011 and underwent placement of a left carotid artery stent.  The pt tolerated the procedure well and was transported to the PACU in good condition. By POD 1, she was neurologically intact and doing well.   The remainder of the hospital course consisted of increasing ambulation and increasing intake of solids without difficulty.    Basename 03/16/11 0416 03/15/11 0749  NA 135 140  K 4.2 4.5  CL 105 110  CO2 24 --  GLUCOSE 138* 153*  BUN 29* 30*  CALCIUM 8.2* --    Basename 03/16/11 0416 03/15/11 0749  WBC 6.3 --  HGB 8.7* 10.5*  HCT 27.1* 31.0*  PLT 217 --   No results found for this basename: INR:2 in the last 72 hours   Discharge Instructions:   The patient is discharged to home with extensive instructions on wound care and progressive ambulation.  They are instructed not to drive or perform any heavy lifting until returning to see the physician in his office.  Discharge Orders    Future Appointments: Provider: Department: Dept Phone: Center:   03/27/2011 10:45 AM Seth Bake  Durene Cal, MD [facility] [phone] [facility]   04/06/2011 8:45 AM Dava Najjar Elie Goody [facility] [phone] None   04/06/2011 9:15 AM Linton Rump Allyson Sabal, PA [facility] [phone] None   05/04/2011 9:00 AM Gwenith Spitz Shumate [facility] [phone] None   05/04/2011 9:30 AM [facility] Nurse [facility] [phone] None   05/09/2011 8:30 AM Ala Dach Letitia Libra., MD [facility] [phone] [email or web address]   06/01/2011 9:00 AM Gwenith Spitz Shumate [facility] [phone] None   06/01/2011 9:30 AM [facility] Flush Nurse [facility] [phone] None   07/06/2011 9:00 AM Gwenith Spitz Shumate [facility] [phone] None   07/06/2011 9:30 AM [facility] Flush Nurse [facility] [phone] None   08/04/2011 9:00 AM Krista Blue [facility] [phone] None   08/04/2011 9:30 AM [facility] Flush Nurse [facility] [phone] None   08/31/2011 9:00 AM Gwenith Spitz Shumate [facility] [phone] None   08/31/2011 9:30 AM [facility] Flush Nurse [facility] [phone] None   10/05/2011 9:00 AM Gwenith Spitz Shumate [facility] [phone] None   10/05/2011 9:30 AM [facility] Flush Nurse [facility] [phone] None   11/02/2011 9:30 AM [facility] Flush Nurse [facility] [phone] None     Future Orders Please Complete By Expires   Resume previous diet      Driving Restrictions      Comments:   No driving for 2 weeks   Lifting restrictions      Comments:   No lifting for 4 weeks   Call MD for:  temperature >100.5      Call MD for:  redness, tenderness, or signs of infection (pain, swelling, bleeding, redness, odor or green/yellow discharge around incision site)      Call MD for:  severe or increased pain, loss or decreased feeling  in affected limb(s)      CAROTID Sugery: Call MD for difficulty swallowing or speaking; weakness in arms or legs that is a new symtom; severe headache.  If you have increased swelling in the neck and/or  are having  difficulty breathing, CALL 911         Discharge Diagnosis:  left carotid stenosis  Secondary Diagnosis: Patient Active Problem List  Diagnoses  . NEOPLASM, MALIGNANT, RIGHT BREAST  . HYPOTHYROIDISM  . DIABETES MELLITUS, TYPE II  . HYPERLIPIDEMIA  . HYPERKALEMIA  . ANEMIA  . DEPRESSION  . HYPERTENSION  . GERD  . OSTEOARTHRITIS  . Nonspecific (abnormal) findings on radiological and other examination of body structure  . OTH NONSPC ABN FINDNG RAD&OTH EXM BODY STRUCTURE  . ABNORMAL CHEST XRAY  . CAROTID ARTERY STENOSIS   Past Medical History  Diagnosis Date  . Anemia   . Depression   . Diabetes mellitus type II   . GERD (gastroesophageal reflux disease)   . Hyperlipidemia   . Hypothyroidism   . Osteoarthritis   . Coronary artery stenosis     bilateral  . CAD (coronary artery disease)   . OSA (obstructive sleep apnea)   . Breast cancer     right breast cancer-invasive  ductal carcinoma (StageIV)  . Hypertension   . Anxiety   . Carotid artery occlusion        Robyn, [place]  Home Medication Instructions [id number]   Printed on:03/17/11 0746  Medication Information                    cyanocobalamin (,VITAMIN B-12,) 1000 MCG/ML injection Inject 1,000 mcg into the muscle every 30 (thirty) days. Last dose 03/09/2011           ibuprofen (ADVIL) 200 MG tablet Take 200 mg by mouth 3 (three) times daily.             LORazepam (ATIVAN) 0.5 MG tablet Take 0.5 mg by mouth 2 (two) times daily.             bisacodyl (DULCOLAX) 5 MG EC tablet Take 5 mg by mouth daily as needed. For constipation           calcium carbonate (TUMS - DOSED IN MG ELEMENTAL CALCIUM) 500 MG chewable tablet Chew 1 tablet by mouth 2 (two) times daily. Take 1 day before and 1 day after chemo treatment           calcium carbonate (OS-CAL) 600 MG TABS Take 600 mg by mouth 2 (two) times daily with a meal.             insulin glargine (LANTUS) 100 UNIT/ML injection Inject 60 Units into the  skin at bedtime.            atorvastatin (LIPITOR) 40 MG tablet Take 40 mg by mouth daily.           carvedilol (COREG) 12.5 MG tablet Take 12.5 mg by mouth 2 (two) times daily with a meal.           citalopram (CELEXA) 20 MG tablet Take 20 mg by mouth daily.           hydrochlorothiazide (MICROZIDE) 12.5 MG capsule  Take 12.5 mg by mouth daily.           levothyroxine (SYNTHROID, LEVOTHROID) 150 MCG tablet Take 150-225 mcg by mouth daily. Take 1.5 tablets on mondays and 1 tablet on the rest of the week           losartan (COZAAR) 50 MG tablet Take 50 mg by mouth daily.           metFORMIN (GLUCOPHAGE) 500 MG tablet Take 500 mg by mouth daily with breakfast.           denosumab (XGEVA) 120 MG/1.7ML SOLN Inject 120 mg into the skin See admin instructions. Inject every 2 months. Last dose 02/02/2011.           letrozole (FEMARA) 2.5 MG tablet Take 1 tablet (2.5 mg total) by mouth daily.           simvastatin (ZOCOR) 40 MG tablet Take 40 mg by mouth every evening.           aspirin 81 MG chewable tablet Chew 81 mg by mouth daily.           metFORMIN (GLUCOPHAGE) 500 MG tablet Take 500 mg by mouth every morning.           clopidogrel (PLAVIX) 75 MG tablet Take 75 mg by mouth daily.           oxyCODONE-acetaminophen (PERCOCET) 5-325 MG per tablet Take 1 tablet by mouth 3 (three) times daily as needed. For pain             Disposition: home   Patient's condition: is Good  Follow up: 1. Dr. Myra Gianotti in 2 weeks   Newton Pigg, PA-C [facility] [phone] 03/17/2011  7:46 AM

## 2011-03-18 NOTE — Discharge Summary (Signed)
Stable post carotid stent, follow up 1 month with ultrasound  [facility] Jordi Lacko

## 2011-03-20 ENCOUNTER — Other Ambulatory Visit: Payer: Self-pay

## 2011-03-20 DIAGNOSIS — C50911 Malignant neoplasm of unspecified site of right female breast: Secondary | ICD-10-CM

## 2011-03-20 MED ORDER — LORAZEPAM 0.5 MG PO TABS: ORAL_TABLET | ORAL | Status: DC

## 2011-03-24 ENCOUNTER — Encounter: Payer: Self-pay | Admitting: Surgery

## 2011-03-27 ENCOUNTER — Encounter: Payer: Self-pay | Admitting: Surgery

## 2011-03-27 ENCOUNTER — Ambulatory Visit (INDEPENDENT_AMBULATORY_CARE_PROVIDER_SITE_OTHER): Payer: Medicare Other | Admitting: Surgery

## 2011-03-27 ENCOUNTER — Other Ambulatory Visit (INDEPENDENT_AMBULATORY_CARE_PROVIDER_SITE_OTHER): Payer: Medicare Other | Admitting: *Deleted

## 2011-03-27 VITALS — BP 161/50 | HR 67 | Resp 16 | Ht 63.0 in | Wt 266.0 lb

## 2011-03-27 DIAGNOSIS — I6529 Occlusion and stenosis of unspecified carotid artery: Secondary | ICD-10-CM

## 2011-03-27 DIAGNOSIS — Z48812 Encounter for surgical aftercare following surgery on the circulatory system: Secondary | ICD-10-CM

## 2011-03-27 NOTE — Progress Notes (Signed)
[facility] [place]   Patient name: Brittney Cunningham MRN: [id number] DOB: 1940/03/30 Sex: female     Chief Complaint  Patient presents with  . Carotid    2 wk  f/up left carotid stent     HISTORY OF PRESENT ILLNESS: The patient is back today for followup. She is status post left carotid stenting on 03/15/2011. This was done for asymptomatic high-grade stenosis. The patient is postprocedure course was uncomplicated. She is back today for followup. She denies neurologic symptoms. She does complain of headaches and she started taking the Plavix.  Past Medical History  Diagnosis Date  . Anemia   . Depression   . Diabetes mellitus type II   . GERD (gastroesophageal reflux disease)   . Hyperlipidemia   . Hypothyroidism   . Osteoarthritis   . Coronary artery stenosis     bilateral  . CAD (coronary artery disease)   . OSA (obstructive sleep apnea)   . Breast cancer     right breast cancer-invasive  ductal carcinoma (StageIV)  . Hypertension   . Anxiety   . Carotid artery occlusion     Past Surgical History  Procedure Date  . Incisional breast biopsy     remoted left breast biopsy  . Cesarean section   . Other surgical history     GYN surgery  . Knee surgery     right knee surgery  . Carotid endarterectomy     right carotid  . Port-a-cath removal   . Modified mastectomy     Right breast  . Breast surgery   . Mastectomy     History   Social History  . Marital Status: Married    Spouse Name: N/A    Number of Children: 3  . Years of Education: N/A   Occupational History  .     Social History Main Topics  . Smoking status: Former Smoker    Types: Cigarettes    Quit date: 01/31/1988  . Smokeless tobacco: Never Used  . Alcohol Use: No  . Drug Use: No  . Sexually Active: Not on file   Other Topics Concern  . 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 Arthritis    . Coronary artery disease      first degree relative  . Stroke      first degree relative    Allergies as of 03/27/2011 - Review Complete 03/27/2011  Allergen Reaction Noted  . Chlorhexidine Hives, Itching, and Rash 01/05/2011  . Adhesive (tape) Hives 03/06/2011  . Codeine Swelling     Current Outpatient Prescriptions on File Prior to Visit  Medication Sig Dispense Refill  . aspirin 81 MG chewable tablet Chew 81 mg by mouth daily.      Marland Kitchen atorvastatin (LIPITOR) 40 MG tablet Take 40 mg by mouth daily.      . bisacodyl (DULCOLAX) 5 MG EC tablet Take 5 mg by mouth daily as needed. For constipation      . calcium carbonate (OS-CAL) 600 MG TABS Take 600 mg by mouth 2 (two) times daily with a meal.        . calcium carbonate (TUMS - DOSED IN MG ELEMENTAL CALCIUM) 500 MG chewable tablet Chew 1 tablet by mouth 2 (two) times daily. Take 1 day before and 1 day after chemo treatment      .  carvedilol (COREG) 12.5 MG tablet Take 12.5 mg by mouth 2 (two) times daily with a meal.      . citalopram (CELEXA) 20 MG tablet Take 20 mg by mouth daily.      . clopidogrel (PLAVIX) 75 MG tablet Take 75 mg by mouth daily.      . cyanocobalamin (,VITAMIN B-12,) 1000 MCG/ML injection Inject 1,000 mcg into the muscle every 30 (thirty) days. Last dose 03/09/2011      . denosumab (XGEVA) 120 MG/1.7ML SOLN Inject 120 mg into the skin See admin instructions. Inject every 2 months. Last dose 02/02/2011.      . hydrochlorothiazide (MICROZIDE) 12.5 MG capsule Take 12.5 mg by mouth daily.      Marland Kitchen ibuprofen (ADVIL) 200 MG tablet Take 200 mg by mouth 3 (three) times daily.        . insulin glargine (LANTUS) 100 UNIT/ML injection Inject 60 Units into the skin at bedtime.       Marland Kitchen letrozole (FEMARA) 2.5 MG tablet Take 1 tablet (2.5 mg total) by mouth daily.  30 tablet  0  . levothyroxine (SYNTHROID, LEVOTHROID) 150 MCG tablet Take 150-225 mcg by mouth daily. Take  1.5 tablets on mondays and 1 tablet on the rest of the week      . LORazepam (ATIVAN) 0.5 MG tablet Take 1 tab by mouth twice a day as needed for anxiety  60 tablet  0  . losartan (COZAAR) 50 MG tablet Take 50 mg by mouth daily.      . metFORMIN (GLUCOPHAGE) 500 MG tablet Take 500 mg by mouth daily with breakfast.      . metFORMIN (GLUCOPHAGE) 500 MG tablet Take 500 mg by mouth every morning.      Marland Kitchen oxyCODONE-acetaminophen (PERCOCET) 5-325 MG per tablet Take 1 tablet by mouth 3 (three) times daily as needed. For pain  20 tablet  0  . simvastatin (ZOCOR) 40 MG tablet Take 40 mg by mouth every evening.         REVIEW OF SYSTEMS:  no change from prior visit  PHYSICAL EXAMINATION:   Vital signs are BP 161/50  Pulse 67  Resp 16  Ht 5\' 3"  (1.6 m)  Wt 266 lb (120.657 kg)  BMI 47.12 kg/m2  SpO2 95% General: The patient appears their stated age. HEENT:  No gross abnormalities Pulmonary:  Non labored breathing Abdomen: Access site is soft without bruit Musculoskeletal: There are no major deformities. Neurologic: No focal weakness or paresthesias are detected, Skin: There are no ulcer or rashes noted. Psychiatric: The patient has normal affect. Cardiovascular: There is a regular rate and rhythm without significant murmur appreciated.   Diagnostic Studies Ultrasound shows widely patent stent without evidence of stenosis  Assessment: Status post left carotid stem and right carotid endarterectomy Plan: I will plan on seeing the patient back in 3 months. At that time we will assess how she is doing on the Plavix. She states she's been having headaches with this. I will consider stopping her Plavix at that time. She will then need to come back 3 months after that for a carotid ultrasound.  Jorge Ny, M.D. [facility] [place] Office: [phone] Pager:  [phone]

## 2011-03-29 ENCOUNTER — Other Ambulatory Visit: Payer: Self-pay | Admitting: Certified Registered Nurse Anesthetist

## 2011-04-06 ENCOUNTER — Ambulatory Visit: Payer: Medicare Other | Admitting: Physician Assistant

## 2011-04-06 ENCOUNTER — Other Ambulatory Visit: Payer: Medicare Other | Admitting: Lab

## 2011-04-10 ENCOUNTER — Encounter: Payer: Self-pay | Admitting: Physician Assistant

## 2011-04-10 ENCOUNTER — Other Ambulatory Visit (HOSPITAL_BASED_OUTPATIENT_CLINIC_OR_DEPARTMENT_OTHER): Payer: Medicare Other | Admitting: Lab

## 2011-04-10 ENCOUNTER — Ambulatory Visit (HOSPITAL_BASED_OUTPATIENT_CLINIC_OR_DEPARTMENT_OTHER): Payer: Medicare Other | Admitting: Physician Assistant

## 2011-04-10 ENCOUNTER — Telehealth: Payer: Self-pay | Admitting: Oncology

## 2011-04-10 VITALS — BP 150/64 | HR 77 | Temp 98.5°F | Ht 63.0 in | Wt 268.0 lb

## 2011-04-10 DIAGNOSIS — D510 Vitamin B12 deficiency anemia due to intrinsic factor deficiency: Secondary | ICD-10-CM

## 2011-04-10 DIAGNOSIS — Z79811 Long term (current) use of aromatase inhibitors: Secondary | ICD-10-CM

## 2011-04-10 DIAGNOSIS — C50919 Malignant neoplasm of unspecified site of unspecified female breast: Secondary | ICD-10-CM

## 2011-04-10 DIAGNOSIS — Z170 Estrogen receptor positive status [ER+]: Secondary | ICD-10-CM

## 2011-04-10 DIAGNOSIS — D649 Anemia, unspecified: Secondary | ICD-10-CM

## 2011-04-10 LAB — CBC WITH DIFFERENTIAL/PLATELET
BASO%: 0.3 % (ref 0.0–2.0)
Basophils Absolute: 0 10*3/uL (ref 0.0–0.1)
EOS%: 3.6 % (ref 0.0–7.0)
HGB: 9.6 g/dL — ABNORMAL LOW (ref 11.6–15.9)
MCH: 29.5 pg (ref 25.1–34.0)
MCHC: 32.6 g/dL (ref 31.5–36.0)
MONO#: 0.5 10*3/uL (ref 0.1–0.9)
RDW: 15.1 % — ABNORMAL HIGH (ref 11.2–14.5)
WBC: 6.5 10*3/uL (ref 3.9–10.3)
lymph#: 1.5 10*3/uL (ref 0.9–3.3)

## 2011-04-10 LAB — COMPREHENSIVE METABOLIC PANEL
ALT: 11 U/L (ref 0–35)
AST: 10 U/L (ref 0–37)
Albumin: 3.8 g/dL (ref 3.5–5.2)
Calcium: 8.9 mg/dL (ref 8.4–10.5)
Chloride: 103 mEq/L (ref 96–112)
Potassium: 4.6 mEq/L (ref 3.5–5.3)

## 2011-04-10 MED ORDER — GABAPENTIN 300 MG PO CAPS: 300.0000 mg | ORAL_CAPSULE | Freq: Every evening | ORAL | Status: DC | PRN

## 2011-04-10 MED ORDER — LETROZOLE 2.5 MG PO TABS: 2.5000 mg | ORAL_TABLET | Freq: Every day | ORAL | Status: DC

## 2011-04-10 NOTE — Progress Notes (Signed)
ID: Brittney Cunningham  DOB: 1940-11-21  MR#: [id number]  CSN#: [id number]   HPI: Brittney Cunningham was diagnosed with right breast carcinoma in May of 2011, a biopsy showing a grade 2 invasive ductal carcinoma, T2 NXM1, stage IV at presentation. There was bone only involvement. Tumor was strongly ER and PR positive, HER-2/neu negative, with an MIP-1 of 26%.  She's treated neoadjuvant weight with letrozole and zoledronic acid beginning in June of 2011. She status post right modified radical mastectomy in February 2012 for a ypT2 ypN2, grade 1 invasive ductal carcinoma. Margins were negative.  She received postmastectomy radiation therapy, completed in August of 2012.  Patient has continued on letrozole. She receive zoledronic acid until she was switched to ask GI to 2 concerns regarding increasing serum creatinine. Now receiving XT that every 8 weeks.  Also the history of pernicious anemia, receives vitamin B 12 injections here monthly.    Interval History:   Brittney Cunningham returns today for followup of her stage IV breast cancer. The interval history is remarkable for the patient undergoing a left carotid stent under the care of Dr.  Myra Gianotti in mid February. She tolerated the procedure well, with no complications. She has recovered well. She's had no Cunningham of abnormal bleeding. Her energy level is good. In fact she tells me that since the procedure she feels like things have "opened back up again". She has had a slight cough, productive of clear phlegm, and has been treating this symptomatically. No fevers, chills, or night sweats. No increased shortness of breath.  Today's biggest complaint today continues to be her joint pain and her postsurgical pain. The joint pain continues, and is typically treated adequately with hydrocodone/APAP. She also has postsurgical pain in the right chest wall and right axilla. She's taking gabapentin affectively for this in the past, but no longer has that on hand at home. She would like this  refilled as well. She continues on letrozole daily with good tolerance. The joint pain does not seem to be negatively affected by the letrozole. She's had no significant hot flashes. No increased vaginal dryness. She also requests a refill on the letrozole today. generally unremarkable.  A detailed review of systems is otherwise noncontributory as noted below.  Review of Systems: Constitutional:  no weight loss, fever, night sweats and feels well Eyes: uses glasses [id number] Cardiovascular: no chest pain or dyspnea on exertion Respiratory: positive for - cough negative for - hemoptysis, orthopnea, pleuritic pain or shortness of breath Neurological: no TIA or stroke symptoms negative Dermatological: negative Gastrointestinal: no abdominal pain, nausea, change in bowel habits, or black or bloody stools Genito-Urinary: no dysuria, trouble voiding, or hematuria Hematological and Lymphatic: negative Breast: pain in right chest wall s/p mastectomy Musculoskeletal: positive for - joint pain and joint stiffness Remaining ROS negative.     Allergies  Allergen Reactions  . Chlorhexidine Hives, Itching and Rash  . Adhesive (Tape) Hives  . Codeine Swelling    Current Outpatient Prescriptions  Medication Sig Dispense Refill  . aspirin 81 MG chewable tablet Chew 81 mg by mouth daily.      Marland Kitchen atorvastatin (LIPITOR) 40 MG tablet Take 40 mg by mouth daily.      . bisacodyl (DULCOLAX) 5 MG EC tablet Take 5 mg by mouth daily as needed. For constipation      . calcium carbonate (OS-CAL) 600 MG TABS Take 600 mg by mouth 2 (two) times daily with a meal.        . calcium  carbonate (TUMS - DOSED IN MG ELEMENTAL CALCIUM) 500 MG chewable tablet Chew 1 tablet by mouth 2 (two) times daily. Take 1 day before and 1 day after chemo treatment      . carvedilol (COREG) 12.5 MG tablet Take 12.5 mg by mouth 2 (two) times daily with a meal.      . citalopram (CELEXA) 20 MG tablet Take 20 mg by mouth daily.        . clopidogrel (PLAVIX) 75 MG tablet Take 75 mg by mouth daily.      . cyanocobalamin (,VITAMIN B-12,) 1000 MCG/ML injection Inject 1,000 mcg into the muscle every 30 (thirty) days. Last dose 03/09/2011      . denosumab (XGEVA) 120 MG/1.7ML SOLN Inject 120 mg into the skin See admin instructions. Inject every 2 months. Last dose 02/02/2011.      . gabapentin (NEURONTIN) 300 MG capsule Take 1 capsule (300 mg total) by mouth at bedtime as needed.  30 capsule  5  . hydrochlorothiazide (MICROZIDE) 12.5 MG capsule Take 12.5 mg by mouth daily.      Marland Kitchen ibuprofen (ADVIL) 200 MG tablet Take 200 mg by mouth 3 (three) times daily.        . insulin glargine (LANTUS) 100 UNIT/ML injection Inject 60 Units into the skin at bedtime.       Marland Kitchen letrozole (FEMARA) 2.5 MG tablet Take 1 tablet (2.5 mg total) by mouth daily.  30 tablet  11  . levothyroxine (SYNTHROID, LEVOTHROID) 150 MCG tablet Take 150-225 mcg by mouth daily. Take 1.5 tablets on mondays and 1 tablet on the rest of the week      . LORazepam (ATIVAN) 0.5 MG tablet Take 1 tab by mouth twice a day as needed for anxiety  60 tablet  0  . losartan (COZAAR) 50 MG tablet Take 50 mg by mouth daily.      . metFORMIN (GLUCOPHAGE) 500 MG tablet Take 500 mg by mouth every morning.      Marland Kitchen oxyCODONE-acetaminophen (PERCOCET) 5-325 MG per tablet Take 1 tablet by mouth 3 (three) times daily as needed. 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  History of hypercholesterolemia, history of osteoarthritis.  FAMILY HISTORY:  The patient's father died  from pancreatic cancer at the age of 32.  The patient's mother died from complications of diabetes and heart disease at the age of 9.  The patient has 2 sisters and 2 brothers.  There is no history of breast cancer or ovarian cancer in the immediate family.  One of the patient's paternal aunts out of 6 paternal aunts had breast cancer diagnosed in her 49s.   GYNECOLOGIC HISTORY:  The patient is GX, P3.  First pregnancy to term age 48.  She went through the change of life in her late 31s.  She never took hormones.  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  Her husband of 43 years, Brittney Cunningham, is disabled secondary to a fall.  He has difficulty with walking.  He used to work for [employer] previously.  The patient's Cunningham, Brittney Cunningham, is present today.  He lives in [place], and works for [employer]  Daughter, Brittney Cunningham, 86 years old, lives in [place], and works for [employer].  Daughter, Brittney Cunningham, 38, lives in [place], [place], and works as a Statistician.  The patient attends [employer].  Objective: Middle-aged white woman who appears comfortable  Filed Vitals:   04/10/11 1100  BP: 150/64  Pulse: 77  Temp: 98.5 F (36.9 C)    BMI: Body mass index is 47.47 kg/(m^2).   ECOG FS: 2  Physical Exam: HEENT:  Sclerae anicteric, conjunctivae pink.  Oropharynx clear.  No mucositis or candidiasis.   Nodes:  No cervical, supraclavicular, or axillary lymphadenopathy palpated.  Breast Exam:  Status post right mastectomy with well-healed incision. No suspicious nodularity.  Lungs:  Clear to auscultation bilaterally.  No crackles, rhonchi, or wheezes.   Heart:  Regular rate and rhythm.   Abdomen:  Soft, nontender.  Positive bowel sounds.  No organomegaly or masses palpated.   Musculoskeletal:  No focal spinal tenderness to palpation.  Extremities:  Benign.  No peripheral edema or cyanosis.   Skin:  Benign.   Neuro:  Nonfocal.   Lab  Results:      Chemistry      Component Value Date/Time   NA 135 03/16/2011 0416   K 4.2 03/16/2011 0416   CL 105 03/16/2011 0416   CO2 24 03/16/2011 0416   BUN 29* 03/16/2011 0416   CREATININE 1.39* 03/16/2011 0416      Component Value Date/Time   CALCIUM 8.2* 03/16/2011 0416   ALKPHOS 92 03/09/2011 1359   AST 10 03/09/2011 1359   ALT 10 03/09/2011 1359   BILITOT 0.2* 03/09/2011 1359       Lab Results  Component Value Date   WBC 6.5 04/10/2011   HGB 9.6* 04/10/2011   HCT 29.3* 04/10/2011   MCV 90.3 04/10/2011   PLT 251 04/10/2011   NEUTROABS 4.2 04/10/2011    Studies/Results:   Next mammogram due Oct 2013. Most recent bone scan 11/16/2010 was stable  Assessment:A 71 year old [place] woman   (1) status post right breast biopsy May of 2011 for a grade 2 invasive ductal carcinoma, T2 NX M1, Stage IV,  with bone-only involvement, strongly estrogen and progesterone receptor-positive, HER-2-negative with an MIB-1 of 26%.   (2)  Neoadjuvantly she received letrozole and zoledronic acid beginning in June of 2011 and underwent right modified radical mastectomy February of 2012 for a ypT2 ypN2, grade 1 invasive ductal carcinoma with negative margins.  (3)  She completed radiation therapy in August of 2012.    (4) She has continued on letrozole but was switched from zoledronic acid to [facility] because of concerns regarding her serum creatinine, which is now again normalizing.    (5) She receives B12 injections monthly.      Plan:  Svetlana will receive her next B12 injection today. She is due for next year when she returns in 4 weeks. I have refilled her letrozole which she will continue at 2.5 mg daily. We will also try gabapentin, 300 mg at night, for her postsurgical pain in the right chest wall and right axilla.  Trenise will see me again for followup in 2 months. She knows to call however with any changes or problems prior to that appointment.   Brittney Cunningham 04/10/2011

## 2011-04-10 NOTE — Procedures (Unsigned)
CAROTID DUPLEX EXAM  INDICATION:  One month status post left ICA stent.  HISTORY: Diabetes:  Yes Cardiac:  No Hypertension:  Yes Smoking:  Previous Previous Surgery:  Right CEA 05/28/2009, left ICA stent CV History: Amaurosis Fugax No, Paresthesias No, Hemiparesis No                                      RIGHT             LEFT Brachial systolic pressure: Brachial Doppler waveforms: Vertebral direction of flow:                          Antegrade DUPLEX VELOCITIES (cm/sec) CCA peak systolic                                     100 ECA peak systolic                                     175 ICA peak systolic                                     118 ICA end diastolic                                     20 PLAQUE MORPHOLOGY:                                    Heterogeneous PLAQUE AMOUNT:                                        Mild PLAQUE LOCATION:                                      CCA/ECA/ICA  IMPRESSION:  Widely patent left internal carotid artery stent without evidence of restenosis.  Stent shadowing is present, however, no post stenotic turbulence is observed.  Cannot rule out mild hyperplasia.   ___________________________________________ V. Charlena Cross, MD  LT/MEDQ  D:  03/27/2011  T:  03/27/2011  Job:  [id number]

## 2011-04-10 NOTE — Telephone Encounter (Signed)
gve the pt her may 2013 appt calendar 

## 2011-05-04 ENCOUNTER — Other Ambulatory Visit (HOSPITAL_BASED_OUTPATIENT_CLINIC_OR_DEPARTMENT_OTHER): Payer: Medicare Other | Admitting: Lab

## 2011-05-04 ENCOUNTER — Ambulatory Visit (HOSPITAL_BASED_OUTPATIENT_CLINIC_OR_DEPARTMENT_OTHER): Payer: Medicare Other

## 2011-05-04 VITALS — BP 132/68 | HR 63 | Temp 97.0°F

## 2011-05-04 DIAGNOSIS — C50919 Malignant neoplasm of unspecified site of unspecified female breast: Secondary | ICD-10-CM

## 2011-05-04 DIAGNOSIS — C7951 Secondary malignant neoplasm of bone: Secondary | ICD-10-CM

## 2011-05-04 DIAGNOSIS — C7952 Secondary malignant neoplasm of bone marrow: Secondary | ICD-10-CM

## 2011-05-04 DIAGNOSIS — E785 Hyperlipidemia, unspecified: Secondary | ICD-10-CM

## 2011-05-04 DIAGNOSIS — D649 Anemia, unspecified: Secondary | ICD-10-CM

## 2011-05-04 DIAGNOSIS — C50419 Malignant neoplasm of upper-outer quadrant of unspecified female breast: Secondary | ICD-10-CM

## 2011-05-04 DIAGNOSIS — E039 Hypothyroidism, unspecified: Secondary | ICD-10-CM

## 2011-05-04 LAB — LIPID PANEL
HDL: 27 mg/dL — ABNORMAL LOW (ref >39)
LDL Cholesterol: 103 mg/dL — ABNORMAL HIGH (ref 0–99)

## 2011-05-04 LAB — CANCER ANTIGEN 27.29: CA 27.29: 97 U/mL — ABNORMAL HIGH (ref 0–39)

## 2011-05-04 LAB — CMP AND LIVER
ALT: 8 U/L (ref 0–35)
Bilirubin, Direct: 0.1 mg/dL (ref 0.0–0.3)
CO2: 20 mEq/L (ref 19–32)
Sodium: 137 mEq/L (ref 135–145)
Total Bilirubin: 0.3 mg/dL (ref 0.3–1.2)
Total Protein: 6.8 g/dL (ref 6.0–8.3)

## 2011-05-04 LAB — CBC WITH DIFFERENTIAL/PLATELET
Basophils Absolute: 0 10*3/uL (ref 0.0–0.1)
EOS%: 4.4 % (ref 0.0–7.0)
MCH: 28.7 pg (ref 25.1–34.0)
MCHC: 31.9 g/dL (ref 31.5–36.0)
MCV: 89.8 fL (ref 79.5–101.0)
MONO%: 5.5 % (ref 0.0–14.0)
RBC: 3.14 10*6/uL — ABNORMAL LOW (ref 3.70–5.45)
RDW: 14.9 % — ABNORMAL HIGH (ref 11.2–14.5)
nRBC: 0 % (ref 0–0)

## 2011-05-04 MED ORDER — CYANOCOBALAMIN 1000 MCG/ML IJ SOLN
1000.0000 ug | Freq: Once | INTRAMUSCULAR | Status: AC
  Administered 2011-05-04: 1000 ug via INTRAMUSCULAR

## 2011-05-04 MED ORDER — SODIUM CHLORIDE 0.9 % IJ SOLN
10.0000 mL | INTRAMUSCULAR | Status: DC | PRN
  Administered 2011-05-04: 10 mL via INTRAVENOUS
  Filled 2011-05-04: qty 10

## 2011-05-04 MED ORDER — DENOSUMAB 120 MG/1.7ML ~~LOC~~ SOLN
120.0000 mg | Freq: Once | SUBCUTANEOUS | Status: AC
  Administered 2011-05-04: 120 mg via SUBCUTANEOUS
  Filled 2011-05-04: qty 1.7

## 2011-05-04 MED ORDER — HEPARIN SOD (PORK) LOCK FLUSH 100 UNIT/ML IV SOLN
500.0000 [IU] | Freq: Once | INTRAVENOUS | Status: AC
  Administered 2011-05-04: 500 [IU] via INTRAVENOUS
  Filled 2011-05-04: qty 5

## 2011-05-08 ENCOUNTER — Telehealth: Payer: Self-pay | Admitting: Internal Medicine

## 2011-05-08 ENCOUNTER — Other Ambulatory Visit: Payer: Self-pay | Admitting: *Deleted

## 2011-05-08 MED ORDER — LOSARTAN POTASSIUM 50 MG PO TABS: 50.0000 mg | ORAL_TABLET | Freq: Every day | ORAL | Status: DC

## 2011-05-08 MED ORDER — OXYCODONE-ACETAMINOPHEN 5-325 MG PO TABS: 1.0000 | ORAL_TABLET | Freq: Three times a day (TID) | ORAL | Status: DC | PRN

## 2011-05-08 NOTE — Telephone Encounter (Signed)
Rx refill sent to pharmacy. 

## 2011-05-09 ENCOUNTER — Ambulatory Visit: Payer: Medicare Other | Admitting: Internal Medicine

## 2011-05-10 ENCOUNTER — Telehealth: Payer: Self-pay | Admitting: *Deleted

## 2011-05-10 DIAGNOSIS — E039 Hypothyroidism, unspecified: Secondary | ICD-10-CM

## 2011-05-10 NOTE — Telephone Encounter (Signed)
Call placed to patient at [phone], no answer, no voice mail.   Received lab results TSH worsening ( 21.108). Dr Rodena Medin would like to know if patient is taking medication daily as instructed, if yes then dose adjustment is needed.

## 2011-05-12 MED ORDER — LEVOTHYROXINE SODIUM 150 MCG PO TABS: ORAL_TABLET | ORAL | Status: DC

## 2011-05-12 NOTE — Telephone Encounter (Signed)
Call placed to patient at [phone], she was asked about thyroid medication; she stated that she has taken the medication on a regular basis.She is taking 1 and 1/2 tablet on Monday and all other day one tablet.

## 2011-05-12 NOTE — Telephone Encounter (Signed)
Call placed to patient at [phone], she was informed per Dr Rodena Medin instructions and has verbalized understanding. Rx refill sent to pharmacy. Lab orders entered for June 2013.

## 2011-05-12 NOTE — Telephone Encounter (Signed)
Taking qd except Monday 1.5 tablet. Change to 1.5 tablet qd. Make sure takes on an empty stomach without other medications (helps absorption). Go to lab 6-7 week tsh/free t4 (hypothyroidism)

## 2011-05-22 ENCOUNTER — Telehealth: Payer: Self-pay | Admitting: Internal Medicine

## 2011-05-22 ENCOUNTER — Other Ambulatory Visit: Payer: Self-pay | Admitting: *Deleted

## 2011-05-22 DIAGNOSIS — C50911 Malignant neoplasm of unspecified site of right female breast: Secondary | ICD-10-CM

## 2011-05-22 MED ORDER — LORAZEPAM 0.5 MG PO TABS: ORAL_TABLET | ORAL | Status: DC

## 2011-05-22 MED ORDER — HYDROCHLOROTHIAZIDE 12.5 MG PO CAPS: 12.5000 mg | ORAL_CAPSULE | Freq: Every day | ORAL | Status: DC

## 2011-05-22 NOTE — Telephone Encounter (Signed)
Rx refill sent to pharmacy. 

## 2011-05-22 NOTE — Telephone Encounter (Signed)
#  30  rf 6 

## 2011-05-22 NOTE — Telephone Encounter (Signed)
Refill- hydrochlorothiazide 12.5mg  cap. Take one capsule by mouth every morning. Qty 30 last fill 3.18.13

## 2011-06-01 ENCOUNTER — Ambulatory Visit: Payer: Medicare Other | Admitting: Physician Assistant

## 2011-06-01 ENCOUNTER — Telehealth: Payer: Self-pay | Admitting: *Deleted

## 2011-06-01 ENCOUNTER — Other Ambulatory Visit: Payer: Medicare Other | Admitting: Lab

## 2011-06-01 NOTE — Telephone Encounter (Signed)
Received a message via voicemail that the patient is sick. She will call back to reschedule. Desk RN notified.  JMW

## 2011-06-05 ENCOUNTER — Other Ambulatory Visit: Payer: Self-pay | Admitting: *Deleted

## 2011-06-05 DIAGNOSIS — R52 Pain, unspecified: Secondary | ICD-10-CM

## 2011-06-05 MED ORDER — OXYCODONE-ACETAMINOPHEN 5-325 MG PO TABS: 1.0000 | ORAL_TABLET | Freq: Three times a day (TID) | ORAL | Status: DC | PRN

## 2011-06-07 ENCOUNTER — Telehealth: Payer: Self-pay | Admitting: Internal Medicine

## 2011-06-07 MED ORDER — LEVOTHYROXINE SODIUM 150 MCG PO TABS: ORAL_TABLET | ORAL | Status: DC

## 2011-06-07 NOTE — Telephone Encounter (Signed)
Refill- levothyroxine sodium tab. Take one and a half tablets by mouth on Monday and all other days take one tablet by mouth every day. Qty 35 last fill 4.6.13

## 2011-06-07 NOTE — Telephone Encounter (Signed)
Dosage change on 04.12.13 per [facility] [see phone note in EMR]; Sig: Take 1.5 tablets by mouth once a day/SLS Rx Done.

## 2011-06-16 ENCOUNTER — Ambulatory Visit (HOSPITAL_BASED_OUTPATIENT_CLINIC_OR_DEPARTMENT_OTHER): Payer: Medicare Other | Admitting: Physician Assistant

## 2011-06-16 ENCOUNTER — Encounter: Payer: Self-pay | Admitting: Physician Assistant

## 2011-06-16 ENCOUNTER — Telehealth: Payer: Self-pay | Admitting: Oncology

## 2011-06-16 ENCOUNTER — Other Ambulatory Visit (HOSPITAL_BASED_OUTPATIENT_CLINIC_OR_DEPARTMENT_OTHER): Payer: Medicare Other | Admitting: Lab

## 2011-06-16 VITALS — BP 108/63 | HR 72 | Temp 98.2°F | Ht 63.0 in | Wt 266.0 lb

## 2011-06-16 DIAGNOSIS — D510 Vitamin B12 deficiency anemia due to intrinsic factor deficiency: Secondary | ICD-10-CM

## 2011-06-16 DIAGNOSIS — C50419 Malignant neoplasm of upper-outer quadrant of unspecified female breast: Secondary | ICD-10-CM

## 2011-06-16 DIAGNOSIS — D649 Anemia, unspecified: Secondary | ICD-10-CM

## 2011-06-16 DIAGNOSIS — C50919 Malignant neoplasm of unspecified site of unspecified female breast: Secondary | ICD-10-CM

## 2011-06-16 DIAGNOSIS — Z170 Estrogen receptor positive status [ER+]: Secondary | ICD-10-CM

## 2011-06-16 DIAGNOSIS — C7951 Secondary malignant neoplasm of bone: Secondary | ICD-10-CM

## 2011-06-16 LAB — CBC WITH DIFFERENTIAL/PLATELET
Basophils Absolute: 0.1 10*3/uL (ref 0.0–0.1)
EOS%: 4.2 % (ref 0.0–7.0)
HCT: 29.1 % — ABNORMAL LOW (ref 34.8–46.6)
HGB: 9.1 g/dL — ABNORMAL LOW (ref 11.6–15.9)
LYMPH%: 23.4 % (ref 14.0–49.7)
MCH: 27.9 pg (ref 25.1–34.0)
MCV: 88.7 fL (ref 79.5–101.0)
MONO%: 9.2 % (ref 0.0–14.0)
NEUT%: 62 % (ref 38.4–76.8)
RDW: 15.7 % — ABNORMAL HIGH (ref 11.2–14.5)

## 2011-06-16 LAB — COMPREHENSIVE METABOLIC PANEL
Alkaline Phosphatase: 91 U/L (ref 39–117)
BUN: 33 mg/dL — ABNORMAL HIGH (ref 6–23)
Creatinine, Ser: 1.25 mg/dL — ABNORMAL HIGH (ref 0.50–1.10)
Glucose, Bld: 163 mg/dL — ABNORMAL HIGH (ref 70–99)
Total Bilirubin: 0.3 mg/dL (ref 0.3–1.2)

## 2011-06-16 MED ORDER — CYANOCOBALAMIN 1000 MCG/ML IJ SOLN
1000.0000 ug | Freq: Once | INTRAMUSCULAR | Status: AC
  Administered 2011-06-16: 1000 ug via INTRAMUSCULAR

## 2011-06-16 NOTE — Telephone Encounter (Signed)
gve the pt her may-oct 2013 appt calendars 

## 2011-06-16 NOTE — Progress Notes (Signed)
ID: Brittney Cunningham   DOB: 1940-05-24  MR#: [id number]  FAO#:[id number]  HISTORY OF PRESENT ILLNESS: Brittney Cunningham was diagnosed with right breast carcinoma in May of 2011, a biopsy showing a grade 2 invasive ductal carcinoma, T2 NXM1, stage IV at presentation. There was bone only involvement. Tumor was strongly ER and PR positive, HER-2/neu negative, with an MIP-1 of 26%.  She was treated neoadjuvantly with letrozole and zoledronic acid beginning in June of 2011. She is status post right modified radical mastectomy in February 2012 for a ypT2 ypN2, grade 1 invasive ductal carcinoma. Margins were negative.  She received postmastectomy radiation therapy, completed in August of 2012.  Patient has continued on letrozole. She received zoledronic acid until she was switched to [facility] due to concerns regarding increasing serum creatinine. Now receiving Xgeva every 8 weeks.  Patient also has a history of pernicious anemia and receives vitamin B 12 injections here monthly.   INTERVAL HISTORY: Brittney Cunningham returns today for followup of her metastatic breast carcinoma. She continues on letrozole daily, 2.5 mg, with good tolerance. She continues to receive monthly B12 injections for pernicious anemia, with Xgeva given every 8 weeks for her bone metastasis. She received both agents on April 4, and is due for B12 only today.  Interval history is most remarkable for Brittney Cunningham having had some serious issues with her teeth since her last visit here. She began to have some discomfort in the right upper molar, and tells me that the tooth simply fell out while she was eating. She has had minimal bleeding, but the gum is still not healing. She describes this area as being mildly uncomfortable, but has no frank pain. Unfortunately, she has also begun to notice the same type of discomfort in a right lower molar as well as a left upper molar and is worried that the same thing might happen care.  Brittney Cunningham continues to have joint pain, back pain, and  postsurgical pain. The gabapentin she is taking does seem to help somewhat with the postsurgical pain in the right chest wall. Her back and joints, however, continue to be very painful. She's been taking hydrocodone/APAP. She's tried taking only 3 tablets daily, 2 in the mornings and 1 at night, but finds that the afternoon is often "really terrible" due to the pain.  REVIEW OF SYSTEMS: Otherwise, Brittney Cunningham is very fatigued. She is followed by Dr. Ty Hilts for hypothyroidism, and they have been making some adjustments in her medications. She's had no fevers, chills, or night sweats. No nausea and no change in bowel habits. No chest pain, cough, or phlegm production, although she does have some shortness of breath with exertion. No orthopnea. No abnormal headaches or dizziness.  A detailed review of systems is otherwise noncontributory.  PAST MEDICAL HISTORY: Past Medical History  Diagnosis Date  . Anemia   . Depression   . Diabetes mellitus type II   . GERD (gastroesophageal reflux disease)   . Hyperlipidemia   . Hypothyroidism   . Osteoarthritis   . Coronary artery stenosis     bilateral  . CAD (coronary artery disease)   . OSA (obstructive sleep apnea)   . Breast cancer     right breast cancer-invasive  ductal carcinoma (StageIV)  . Hypertension   . Anxiety   . Carotid artery occlusion     PAST SURGICAL HISTORY: Past Surgical History  Procedure Date  . Incisional breast biopsy     remoted left breast biopsy  . Cesarean section   . Other  surgical history     GYN surgery  . Knee surgery     right knee surgery  . Carotid endarterectomy     right carotid  . Port-a-cath removal   . Modified mastectomy     Right breast  . Breast surgery   . Mastectomy     FAMILY HISTORY Family History  Problem Relation Age of Onset  . Arthritis    . Coronary artery disease      first degree relative  . Stroke      first degree relative  The patient's father died from pancreatic cancer at  the age of 8. The patient's mother died from complications of diabetes and heart disease at the age of 54. The patient has 2 sisters and 2 brothers. There is no history of breast cancer or ovarian cancer in the immediate family. One of the patient's paternal aunts out of 6 paternal aunts had breast cancer diagnosed in her 64s.    GYNECOLOGIC HISTORY: The patient is GX, P3. First pregnancy to term age 13. She went through the change of life in her late 28s. She never took hormones.   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 Her husband of 43 years, Brittney Cunningham, is disabled secondary to a fall. He has difficulty with walking. He used to work for [employer] previously. The patient's Cunningham, Brittney Cunningham, is present today. He lives in [place], and works for [employer]. Daughter, Brittney Cunningham, 64 years old, lives in [place], and works for [employer]. Daughter, Brittney Cunningham, 69, lives in [place], [place], and works as a Statistician. The patient attends [employer].   ADVANCED DIRECTIVES:  HEALTH MAINTENANCE: History  Substance Use Topics  . Smoking status: Former Smoker    Types: Cigarettes    Quit date: 01/31/1988  . Smokeless tobacco: Never Used  . Alcohol Use: No     Colonoscopy:  PAP:  Bone density:  Lipid panel:  Allergies  Allergen Reactions  . Chlorhexidine Hives, Itching and Rash  . Adhesive (Tape) Hives  . Codeine Swelling    Current Outpatient Prescriptions  Medication Sig Dispense Refill  . aspirin 81 MG chewable tablet Chew 81 mg by mouth daily.      Marland Kitchen atorvastatin (LIPITOR) 40 MG tablet Take 40 mg by mouth daily.      . bisacodyl (DULCOLAX) 5 MG EC tablet Take 5 mg by mouth daily as needed. For constipation      . calcium carbonate (OS-CAL) 600 MG TABS Take 600 mg by mouth 2 (two) times daily with a meal.        . calcium carbonate (TUMS - DOSED IN MG ELEMENTAL CALCIUM) 500 MG chewable tablet Chew 1 tablet by mouth 2 (two)  times daily. Take 1 day before and 1 day after chemo treatment      . carvedilol (COREG) 12.5 MG tablet Take 12.5 mg by mouth 2 (two) times daily with a meal.      . citalopram (CELEXA) 20 MG tablet Take 20 mg by mouth daily.      . clopidogrel (PLAVIX) 75 MG tablet Take 75 mg by mouth daily.      . cyanocobalamin (,VITAMIN B-12,) 1000 MCG/ML injection Inject 1,000 mcg into the muscle every 30 (thirty) days. Last dose 03/09/2011      . denosumab (XGEVA) 120 MG/1.7ML SOLN Inject 120 mg into the skin See admin instructions. Inject every 2 months. Last dose 05/04/2011      .  gabapentin (NEURONTIN) 300 MG capsule Take 1 capsule (300 mg total) by mouth at bedtime as needed.  30 capsule  5  . hydrochlorothiazide (MICROZIDE) 12.5 MG capsule Take 1 capsule (12.5 mg total) by mouth daily.  30 capsule  6  . ibuprofen (ADVIL) 200 MG tablet Take 200 mg by mouth 3 (three) times daily.        . insulin glargine (LANTUS) 100 UNIT/ML injection Inject 60 Units into the skin at bedtime.       Marland Kitchen letrozole (FEMARA) 2.5 MG tablet Take 1 tablet (2.5 mg total) by mouth daily.  30 tablet  11  . levothyroxine (SYNTHROID, LEVOTHROID) 150 MCG tablet Take 1.5 tablets by mouth once a day  45 tablet  3  . LORazepam (ATIVAN) 0.5 MG tablet Take 1 tab by mouth twice a day as needed for anxiety  60 tablet  0  . losartan (COZAAR) 50 MG tablet Take 1 tablet (50 mg total) by mouth daily.  30 tablet  6  . metFORMIN (GLUCOPHAGE) 500 MG tablet Take 500 mg by mouth every morning.      Marland Kitchen oxyCODONE-acetaminophen (PERCOCET) 5-325 MG per tablet Take 1 tablet by mouth 4 (four) times daily as needed. For pain      . DISCONTD: simvastatin (ZOCOR) 40 MG tablet Take 40 mg by mouth every evening.       Current Facility-Administered Medications  Medication Dose Route Frequency Provider Last Rate Last Dose  . cyanocobalamin ((VITAMIN B-12)) injection 1,000 mcg  1,000 mcg Intramuscular Once Lowella Dell, MD   1,000 mcg at 06/16/11 1106     OBJECTIVE: Filed Vitals:   06/16/11 1003  BP: 108/63  Pulse: 72  Temp: 98.2 F (36.8 C)     Body mass index is 47.12 kg/(m^2).    ECOG FS: 1  Filed Weights   06/16/11 1003  Weight: 266 lb (120.657 kg)   Physical Exam: HEENT:  Sclerae anicteric, conjunctivae pink.  Oropharynx clear.    Poor dentition.  No evidence of infection in the unhealed gingiva, right upper molar. Nodes:  No cervical, supraclavicular, or axillary lymphadenopathy palpated.  Breast Exam:  Status post right mastectomy with well-healed incision. No suspicious nodularity. Lungs:  Clear to auscultation bilaterally.  No crackles, rhonchi, or wheezes.   Heart:  Regular rate and rhythm.   Abdomen:  Soft, obese, nontender.  Positive bowel sounds.  No organomegaly or masses palpated.   Musculoskeletal:  No focal spinal tenderness to palpation.  Extremities:  Benign.  No peripheral edema or cyanosis.   Skin:  Benign.   Neuro:  Nonfocal.    LAB RESULTS: Lab Results  Component Value Date   WBC 6.3 06/16/2011   NEUTROABS 3.9 06/16/2011   HGB 9.1* 06/16/2011   HCT 29.1* 06/16/2011   MCV 88.7 06/16/2011   PLT 271 06/16/2011      Chemistry      Component Value Date/Time   NA 137 05/04/2011 0851   K 5.2 05/04/2011 0851   CL 104 05/04/2011 0851   CO2 20 05/04/2011 0851   BUN 34* 05/04/2011 0851   CREATININE 1.21* 05/04/2011 0851      Component Value Date/Time   CALCIUM 9.0 05/04/2011 0851   ALKPHOS 89 05/04/2011 0851   AST 8 05/04/2011 0851   ALT 8 05/04/2011 0851   BILITOT 0.3 05/04/2011 0851       Lab Results  Component Value Date   LABCA2 97* 05/04/2011    STUDIES:  Next mammogram due  Oct 2013. Most recent bone scan 11/16/2010 was stable   ASSESSMENT: A 71 year old [place] woman  (1) status post right breast biopsy May of 2011 for a grade 2 invasive ductal carcinoma, T2 NX M1, Stage IV, with bone-only involvement, strongly estrogen and progesterone receptor-positive, HER-2-negative with an MIB-1 of 26%.  (2)  Neoadjuvantly she received letrozole and zoledronic acid beginning in June of 2011 and underwent right modified radical mastectomy February of 2012 for a ypT2 ypN2, grade 1 invasive ductal carcinoma with negative margins.  (3) She completed radiation therapy in August of 2012.  (4) She has continued on letrozole but was switched from zoledronic acid to [facility] because of concerns regarding her serum creatinine. Rivka Barbara is being given every 8 weeks. (5) She receives B12 injections monthly.   PLAN: Konnor will receive her vitamin B 12 injection here in the office today, and will continue on letrozole as before. I have reviewed this case with Dr. Darnelle Catalan, and we are referring Gerlean for further followup with Dr. Kristin Bruins.  Rondi will see him on Monday, May 20. We were going to prescribe chlorhexidine limits, but she actually has an allergy to chlorhexidine, and prefer to use warm saltwater.  Per Dr. Darrall Dears review, at the very least, we will be holding the [facility] for 6 months.  With regards to Nafisah's pain, I have advised her to try upping the hydrocodone/APAP slightly, perhaps trying 4 tablets daily. She can take 2 in the mornings, 1 in the afternoon, and 1 at bedtime to see if this helps control her pain a little better. She will let us know when she needs a refill.  I will see Alexandre back in one month when she returns for her next B12 injection. We'll reassess our plan at that time. She voices understanding and agreement with our plan and will call with any changes or problems.   Shawnita Krizek    06/16/2011

## 2011-06-19 ENCOUNTER — Encounter (HOSPITAL_COMMUNITY): Payer: Self-pay | Admitting: Dentistry

## 2011-06-19 ENCOUNTER — Ambulatory Visit (HOSPITAL_COMMUNITY): Payer: Self-pay | Admitting: Dentistry

## 2011-06-19 DIAGNOSIS — M270 Developmental disorders of jaws: Secondary | ICD-10-CM

## 2011-06-19 DIAGNOSIS — C50919 Malignant neoplasm of unspecified site of unspecified female breast: Secondary | ICD-10-CM

## 2011-06-19 DIAGNOSIS — T148XXD Other injury of unspecified body region, subsequent encounter: Secondary | ICD-10-CM

## 2011-06-19 DIAGNOSIS — Z9229 Personal history of other drug therapy: Secondary | ICD-10-CM

## 2011-06-19 DIAGNOSIS — C7951 Secondary malignant neoplasm of bone: Secondary | ICD-10-CM

## 2011-06-19 DIAGNOSIS — C7952 Secondary malignant neoplasm of bone marrow: Secondary | ICD-10-CM

## 2011-06-19 DIAGNOSIS — M264 Malocclusion, unspecified: Secondary | ICD-10-CM

## 2011-06-19 DIAGNOSIS — K036 Deposits [accretions] on teeth: Secondary | ICD-10-CM

## 2011-06-19 DIAGNOSIS — M278 Other specified diseases of jaws: Secondary | ICD-10-CM

## 2011-06-19 DIAGNOSIS — K08109 Complete loss of teeth, unspecified cause, unspecified class: Secondary | ICD-10-CM

## 2011-06-19 MED ORDER — PENICILLIN V POTASSIUM 500 MG PO TABS: 500.0000 mg | ORAL_TABLET | Freq: Four times a day (QID) | ORAL | Status: DC

## 2011-06-19 NOTE — Progress Notes (Signed)
DENTAL CONSULTATION  Date of Consultation:  06/19/2011 Patient Name:   Brittney Cunningham Date of Birth:   September 25, 1940 Medical Record Number: [id number]  VITALS: BP 130/59  Pulse 59  Temp(Src) 97.2 F (36.2 C) (Oral)   CHIEF COMPLAINT: Patient referred for a dental consultation.  HPI: Brittney Cunningham is a 71 year old female referred by Dr. Darnelle Catalan for a dental consultation. Patient with history of metastatic breast cancer with a history of Zometa and current Xgeva therapy with increased risk for osteonecrosis of the jaw with invasive dental procedures . Patient now seen to evaluate dental condition and to discuss dental treatment options in relationship to previous Zometa and Xgeva therapies.  Patient has a history of a tooth" falling out" approximately 2 weeks ago. Patient indicates that " the whole tooth came out".  Patient denies any pain from this area. Patient, however, is complaining of upper left and lower right quadrant tooth pain. Patient describes the pain as being sore and achy in nature. Patient indicates that the pain is continuous. She takes Advil or oxycodone to relieve the pain. Patient gives a history of pain at  4-5/10 in intensity. Patient indicates that the pain is currently 4/10 today. Patient indicates that it has been hurting for the past one to 2 weeks ever since the other tooth" fell out".   Patient last saw a dentist approximately 2 years ago before she lost her dental insurance. Patient had been seeing the dentist on every 6 month basis by report. Patient indicates that she was last seen by Dr. Melvyn Neth in [facility] for a pre-Zometa evaluation approximately 2 years ago. Patient indicates that she has no partial dentures.  PMH: Past Medical History  Diagnosis Date  . Anemia   . Depression   . Diabetes mellitus type II   . GERD (gastroesophageal reflux disease)   . Hyperlipidemia   . Hypothyroidism   . Osteoarthritis   . Carotid artery stenosis    bilateral  . CAD (coronary artery disease)   . OSA (obstructive sleep apnea)   . Breast cancer     right breast cancer-invasive  ductal carcinoma (StageIV)  . Hypertension   . Anxiety   . Carotid artery occlusion     PSH: Past Surgical History  Procedure Date  . Incisional breast biopsy     remoted left breast biopsy  . Cesarean section   . Other surgical history     GYN surgery  . Knee surgery     right knee surgery  . Carotid endarterectomy     right carotid  . Port-a-cath removal   . Modified mastectomy     Right breast  . Breast surgery   . Mastectomy     ALLERGIES: Allergies  Allergen Reactions  . Chlorhexidine Hives, Itching and Rash  . Adhesive (Tape) Hives  . Codeine Swelling    MEDICATIONS: Current Outpatient Prescriptions  Medication Sig Dispense Refill  . aspirin 81 MG chewable tablet Chew 81 mg by mouth daily.      Marland Kitchen atorvastatin (LIPITOR) 40 MG tablet Take 40 mg by mouth daily.      . bisacodyl (DULCOLAX) 5 MG EC tablet Take 5 mg by mouth daily as needed. For constipation      . calcium carbonate (OS-CAL) 600 MG TABS Take 600 mg by mouth 2 (two) times daily with a meal.        . calcium carbonate (TUMS - DOSED IN MG ELEMENTAL CALCIUM) 500 MG chewable tablet Chew  1 tablet by mouth 2 (two) times daily. Take 1 day before and 1 day after chemo treatment      . carvedilol (COREG) 12.5 MG tablet Take 12.5 mg by mouth 2 (two) times daily with a meal.      . citalopram (CELEXA) 20 MG tablet Take 20 mg by mouth daily.      . clopidogrel (PLAVIX) 75 MG tablet Take 75 mg by mouth daily.      . cyanocobalamin (,VITAMIN B-12,) 1000 MCG/ML injection Inject 1,000 mcg into the muscle every 30 (thirty) days. Last dose 03/09/2011      . denosumab (XGEVA) 120 MG/1.7ML SOLN Inject 120 mg into the skin See admin instructions. Inject every 2 months. Last dose 05/04/2011      . gabapentin (NEURONTIN) 300 MG capsule Take 1 capsule (300 mg total) by mouth at bedtime as needed.  30  capsule  5  . hydrochlorothiazide (MICROZIDE) 12.5 MG capsule Take 1 capsule (12.5 mg total) by mouth daily.  30 capsule  6  . ibuprofen (ADVIL) 200 MG tablet Take 200 mg by mouth 3 (three) times daily.        . insulin glargine (LANTUS) 100 UNIT/ML injection Inject 60 Units into the skin at bedtime.       Marland Kitchen letrozole (FEMARA) 2.5 MG tablet Take 1 tablet (2.5 mg total) by mouth daily.  30 tablet  11  . levothyroxine (SYNTHROID, LEVOTHROID) 150 MCG tablet Take 1.5 tablets by mouth once a day  45 tablet  3  . LORazepam (ATIVAN) 0.5 MG tablet Take 1 tab by mouth twice a day as needed for anxiety  60 tablet  0  . losartan (COZAAR) 50 MG tablet Take 1 tablet (50 mg total) by mouth daily.  30 tablet  6  . metFORMIN (GLUCOPHAGE) 500 MG tablet Take 500 mg by mouth every morning.      Marland Kitchen oxyCODONE-acetaminophen (PERCOCET) 5-325 MG per tablet Take 1 tablet by mouth 4 (four) times daily as needed. For pain      . DISCONTD: simvastatin (ZOCOR) 40 MG tablet Take 40 mg by mouth every evening.        LABS: Lab Results  Component Value Date   WBC 6.3 06/16/2011   HGB 9.1* 06/16/2011   HCT 29.1* 06/16/2011   MCV 88.7 06/16/2011   PLT 271 06/16/2011      Component Value Date/Time   NA 138 06/16/2011 0952   K 5.3 06/16/2011 0952   CL 107 06/16/2011 0952   CO2 21 06/16/2011 0952   GLUCOSE 163* 06/16/2011 0952   BUN 33* 06/16/2011 0952   CREATININE 1.25* 06/16/2011 0952   CALCIUM 9.5 06/16/2011 0952   GFRNONAA 38* 03/16/2011 0416   GFRAA 44* 03/16/2011 0416   Lab Results  Component Value Date   INR 1.08 07/19/2009   INR 1.04 07/06/2009   INR 1.14 05/26/2009   No results found for this basename: PTT    SOCIAL HISTORY: History   Social History  . Marital Status: Married    Spouse Name: N/A    Number of Children: 3  . Years of Education: N/A   Occupational History  . 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 Smoking status: Former Smoker -- 1.0 packs/day for 20 years    Types:  Cigarettes    Quit date: 01/31/1988  . Smokeless tobacco: Never Used  . Alcohol Use: No  . Drug Use: No  .  Sexually Active: Not on file   Other Topics Concern  . Not on file   Social History Narrative   Retired-ran a daycare facility for 40 years.Married- 43 years 2 sons 1 daughter Former smoker-quit in 4.     FAMILY HISTORY: Family History  Problem Relation Age of Onset  . Arthritis    . Coronary artery disease      first degree relative  . Stroke      first degree relative     REVIEW OF SYSTEMS: Reviewed with patient and positive as above.  DENTAL HISTORY: CHIEF COMPLAINT: Patient referred for a dental consultation.  HPI: Brittney Cunningham is a 71 year old female referred by Dr. Darnelle Catalan for a dental consultation. Patient with history of metastatic breast cancer with a history of Zometa and current Xgeva therapy with increased risk for osteonecrosis of the jaw with invasive dental procedures . Patient now seen to evaluate dental condition and to discuss dental treatment options in relationship to previous Zometa and Xgeva therapies.  Patient has a history of a tooth" falling out" approximately 2 weeks ago. Patient indicates that " the whole tooth came out".  Patient denies any pain from this area. Patient, however, is complaining of upper left and lower right quadrant tooth pain. Patient describes the pain as being sore and achy in nature. Patient indicates that the pain is continuous. She takes Advil or oxycodone to relieve the pain. Patient gives a history of pain at  4-5/10 in intensity. Patient indicates that the pain is currently 4/10 today. Patient indicates that it has been hurting for the past one to 2 weeks ever since the other tooth" fell out".   Patient last saw a dentist approximately 2 years ago before she lost her dental insurance. Patient had been seeing the dentist on every 6 month basis by report. Patient indicates that she was last seen by Dr. Melvyn Neth in  [facility] for a pre-Zometa evaluation approximately 2 years ago. Patient indicates that she has no partial dentures.   DENTAL EXAMINATION:  GENERAL: Patient is a well-developed, well-nourished female in no acute distress. HEAD AND NECK: There is no obvious submandibular lymphadenopathy. The patient denies acute TMJ symptoms . INTRAORAL EXAM: The patient has normal saliva. There is a 5 mm x 3 mm area of exposed bone on the buccal aspect of where tooth #4 "fell out".  The extraction site has granulation tissue in the extraction socket with signs of delayed healing. There is some purulent exudate coming from the exposed bone area. The patient has bilateral mandibular lingual tori. DENTITION: Patient with multiple missing teeth numbers 1, 2, 3, 4, 5, 12, 14, 16, 17, 18, 19, 20, 21, 29, 30, and 32. PERIODONTAL: Patient with chronic periodontitis with plaque and calculus accumulations, generalized gingival recession, and tooth mobility. There is moderate horizontal and vertical bone loss . DENTAL CARIES/SUBOPTIMAL RESTORATIONS: Tooth #31 with a fractured cusp. ENDODONTIC: Patient with a history of acute pulpitis or periodontal symptoms of Tooth #15 and 31. CROWN AND BRIDGE: There are no crown or bridge restorations. PROSTHODONTIC: Patient denies having any partial dentures. OCCLUSION: The patient has a poor occlusal scheme secondary to multiple missing teeth, supra-eruption and drifting of the unopposed teeth into the edentulous areas, and lack of replacement of all missing teeth with clinically acceptable dental prostheses.  RADIOGRAPHIC INTERPRETATION: A  panoramic x-ray was obtained and supplemented with periapical radiographs. There are multiple missing teeth. There is moderate horizontal and vertical bone loss. There is supra-eruption  and drifting of the unopposed teeth into the edentulous areas. There is evidence of previous loss of tooth #4 with extraction socket showing minimal  healing. Radiographic calculus is noted.    ASSESSMENTS: 1. History of loss of tooth #4 with evidence of delayed healing and current exposed bone with purulence around the site of the exposed bone. 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  However, this diagnosis cannot be made until the exposed bone has been present for 6-8 weeks.   2. History of acute pulpitis symptoms or possible pain related to periodontal disease. 3. Chronic periodontitis with bone loss 4. Generalized gingival recession  5. Tooth mobility 6. Multiple missing teeth 7. Supra-eruption and drifting of the unopposed teeth into the edentulous area 8. No history of partial dentures 9. Poor occlusal scheme and malocclusion 10. Bilateral mandibular lingual tori 11. Questionable allergic reaction with chlorhexidine topical applied at the time of the placement of the Port-A-Cath by Dr. Dwain Sarna. Dr. Dwain Sarna was contacted and felt this most likely represented a contact dermatitis versus true allergic reaction.   PLAN/RECOMMENDATIONS: 1. I discussed the risks, benefits, and complications of various treatment options with the patient in relationship to her medical and dental conditions and presence of delayed healing,exposed bone, purulent exudate, and history of previous Zometa and Xgeva therapy for two years. We discussed various treatment options to include a prescription for penicillin VK 500 mg 4 times daily for one month with or without chlorhexidine rinses 3 times daily.  We discussed the need to avoid dental extractions at this time due to the risk for additional exposed bone and osteonecrosis of the jaw. We also discussed possible periodontal therapy and dental restorations. We also discussed referral to the [facility] for comprehensive evaluation and discussion of possible treatment options.  The patient currently wishes to proceed with a  penicillin antibiotic therapy at a 4 times a day dosage.  Patient will then contemplate a test dose of chlorhexidine rinse to determine if she has a true allergic reaction to the chlorhexidine at the 0.12% oral rinse dose. Patient is to be seen in approximately 2 weeks for followup evaluation and discussion of additional referral or treatment at that time.  She is to call if symptoms worsen over that period of time.   2. Discussion of findings with medical team and coordination of future medical and dental care as indicated.    Charlynne Pander, DDS

## 2011-06-23 ENCOUNTER — Telehealth: Payer: Self-pay | Admitting: Internal Medicine

## 2011-06-23 ENCOUNTER — Ambulatory Visit (INDEPENDENT_AMBULATORY_CARE_PROVIDER_SITE_OTHER): Payer: Medicare Other | Admitting: Internal Medicine

## 2011-06-23 ENCOUNTER — Encounter: Payer: Self-pay | Admitting: Internal Medicine

## 2011-06-23 VITALS — BP 108/68 | HR 69 | Temp 98.5°F | Resp 20 | Wt 266.0 lb

## 2011-06-23 DIAGNOSIS — L509 Urticaria, unspecified: Secondary | ICD-10-CM

## 2011-06-23 DIAGNOSIS — E039 Hypothyroidism, unspecified: Secondary | ICD-10-CM

## 2011-06-23 MED ORDER — LEVOTHYROXINE SODIUM 200 MCG PO TABS: 200.0000 ug | ORAL_TABLET | Freq: Every day | ORAL | Status: DC

## 2011-06-23 MED ORDER — LEVOTHYROXINE SODIUM 75 MCG PO TABS: 75.0000 ug | ORAL_TABLET | Freq: Every day | ORAL | Status: DC

## 2011-06-23 NOTE — Telephone Encounter (Signed)
Lab order already in place.

## 2011-06-23 NOTE — Patient Instructions (Signed)
Please schedule tsh/free t4 in approximately 8-10 weeks (hypothyroidism)

## 2011-06-28 DIAGNOSIS — L509 Urticaria, unspecified: Secondary | ICD-10-CM | POA: Insufficient documentation

## 2011-06-28 NOTE — Assessment & Plan Note (Signed)
Attributed to chlorhexidine. Encouraged to discuss possible allergy with dentistry prior to treatment.

## 2011-06-28 NOTE — Progress Notes (Signed)
  Subjective:    Patient ID: Brittney Cunningham, female    DOB: January 26, 1941, 71 y.o.   MRN: [id number]  HPI Pt presents to clinic for followup of multiple medical problems. H/o hypothyroidism and was told by H/O her TSH was abn. Reviewed tsh 21 4/13. Notes fatigue and wt gain. States developed rash/hives that was attributed to chlorhexidine. 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   Past Medical History  Diagnosis Date  . Anemia   . Depression   . Diabetes mellitus type II   . GERD (gastroesophageal reflux disease)   . Hyperlipidemia   . Hypothyroidism   . Osteoarthritis   . Carotid artery stenosis     bilateral  . CAD (coronary artery disease)   . OSA (obstructive sleep apnea)   . Breast cancer     right breast cancer-invasive  ductal carcinoma (StageIV)  . Hypertension   . Anxiety   . Carotid artery occlusion    Past Surgical History  Procedure Date  . Incisional breast biopsy     remoted left breast biopsy  . Cesarean section   . Other surgical history     GYN surgery  . Knee surgery     right knee surgery  . Carotid endarterectomy     right carotid  . Port-a-cath removal   . Modified mastectomy     Right breast  . Breast surgery   . Mastectomy     reports that she quit smoking about 23 years ago. Her smoking use included Cigarettes. She has a 20 pack-year smoking history. She has never used smokeless tobacco. She reports that she does not drink alcohol or use illicit drugs. family history includes Arthritis in an unspecified family member; Coronary artery disease in an unspecified family member; and Stroke in an unspecified family member. Allergies  Allergen Reactions  . Chlorhexidine Hives, Itching and Rash  . Adhesive (Tape) Hives  . Codeine Swelling      Review of Systems see hpi     Objective:   Physical Exam  Nursing note and vitals reviewed. Constitutional: She appears well-developed and  well-nourished. No distress.  HENT:  Head: Normocephalic and atraumatic.  Neurological: She is alert.  Skin: Skin is warm and dry. No rash noted. She is not diaphoretic.  Psychiatric: She has a normal mood and affect.          Assessment & Plan:

## 2011-06-28 NOTE — Assessment & Plan Note (Signed)
suboptimal control. Increase dose to qd. Take medication on empty stomach and without other medications. Obtain tsh/free t4 in ~10 weeks.

## 2011-06-29 ENCOUNTER — Other Ambulatory Visit: Payer: Medicare Other | Admitting: Lab

## 2011-06-30 ENCOUNTER — Other Ambulatory Visit: Payer: Self-pay | Admitting: *Deleted

## 2011-06-30 MED ORDER — OMEPRAZOLE 20 MG PO CPDR: 20.0000 mg | DELAYED_RELEASE_CAPSULE | Freq: Two times a day (BID) | ORAL | Status: DC

## 2011-06-30 NOTE — Telephone Encounter (Signed)
#  [phone] [id number]

## 2011-06-30 NOTE — Telephone Encounter (Signed)
Rx sent to pharmacy. Patient has been advised.

## 2011-06-30 NOTE — Telephone Encounter (Signed)
Patient called and left voice message stating she was given a Rx for Omeprazole 20 twice a day. Her message states that she has not had to use the medication for a long time, however she has recently started to have an increase in acid reflux and would like to restart the medication. She would like to know if Dr Rodena Medin would authorize a Rx to [place] pharmacy for her.

## 2011-07-03 ENCOUNTER — Other Ambulatory Visit: Payer: Self-pay | Admitting: *Deleted

## 2011-07-03 ENCOUNTER — Encounter (HOSPITAL_COMMUNITY): Payer: Self-pay | Admitting: Dentistry

## 2011-07-03 ENCOUNTER — Ambulatory Visit: Payer: Medicare Other | Admitting: Surgery

## 2011-07-03 ENCOUNTER — Ambulatory Visit (HOSPITAL_COMMUNITY): Payer: Self-pay | Admitting: Dentistry

## 2011-07-03 VITALS — BP 155/56 | HR 66 | Temp 98.6°F

## 2011-07-03 DIAGNOSIS — K036 Deposits [accretions] on teeth: Secondary | ICD-10-CM

## 2011-07-03 DIAGNOSIS — T148XXD Other injury of unspecified body region, subsequent encounter: Secondary | ICD-10-CM

## 2011-07-03 DIAGNOSIS — K08109 Complete loss of teeth, unspecified cause, unspecified class: Secondary | ICD-10-CM

## 2011-07-03 DIAGNOSIS — T8189XA Other complications of procedures, not elsewhere classified, initial encounter: Secondary | ICD-10-CM

## 2011-07-03 DIAGNOSIS — M264 Malocclusion, unspecified: Secondary | ICD-10-CM

## 2011-07-03 DIAGNOSIS — M278 Other specified diseases of jaws: Secondary | ICD-10-CM

## 2011-07-03 DIAGNOSIS — K0530 Chronic periodontitis, unspecified: Secondary | ICD-10-CM

## 2011-07-03 MED ORDER — CHLORHEXIDINE GLUCONATE 0.12 % MT SOLN: OROMUCOSAL | Status: DC

## 2011-07-03 MED ORDER — OXYCODONE-ACETAMINOPHEN 5-325 MG PO TABS: 1.0000 | ORAL_TABLET | Freq: Four times a day (QID) | ORAL | Status: DC | PRN

## 2011-07-03 MED ORDER — AMOXICILLIN 500 MG PO CAPS: ORAL_CAPSULE | ORAL | Status: AC

## 2011-07-03 NOTE — Progress Notes (Signed)
07/04/2011  Patient:            Brittney Cunningham Date of Birth:  [birth year] MRN:                [id number]  BP 154/82  Pulse 70  Temp 98.6 F (37 C)  Brittney Cunningham is a 71 year old female that presents for re-evaluation of exposed bone from upper right quadrant area of tooth #4. Patient provided a history of having a maxillary right tooth "fall out" approximately 06/12/2011. Patient indicates that the tooth "came out whole". Patient then presented on 06/19/2011 to dental medicine for dental consultation . Patient had been complaining of upper left and lower right quadrant tooth pain. Patient denied the pain from the upper right quadrant at that time. During that examination on 06/19/2011, I noted exposed bone involving the upper right quadrant area #4 that measured 5 mm x 3 mm with purulent exudate in that area. Patient was prescribed penicillin VK 500 mg 4 times daily at that time. I did NOT prescribe chlorhexidine rinse 0.12% due to a questionable allergic reaction with chlorhexidine scrub for a surgical procedure. Subsequently the surgeon, Dr. Dwain Sarna, indicated that this most likely represented a contact dermatitis with redness to the area where the scrub was applied. He did not feel that this was an allergic reaction. He did not feel that the patient would have any problems with tolerating a chlorhexidine rinse at a 0.12% strength, however, he did agree that a test dose would be prudent. Patient now presents for evaluation of the exposed bone as well as possible test dose with chlorhexidine rinse 0.12%.  Patient is complaining of some increased tooth pain from the upper right quadrant and denies any pain coming from the other previous 2 quadrants. The patient indicates that there may be  swelling on the palatal aspect of #4.   Medical Hx Update:  Past Medical History  Diagnosis Date  . Anemia   . Depression   . Diabetes mellitus type II   . GERD (gastroesophageal reflux disease)   .  Hyperlipidemia   . Hypothyroidism   . Osteoarthritis   . Carotid artery stenosis     bilateral  . CAD (coronary artery disease)   . OSA (obstructive sleep apnea)   . Breast cancer     right breast cancer-invasive  ductal carcinoma (StageIV)  . Hypertension   . Anxiety   . Carotid artery occlusion   . ALLERGIES/ADVERSE DRUG REACTIONS: Allergies  Allergen Reactions  . Chlorhexidine Hives, Itching and Rash  . Adhesive (Tape) Hives  . Codeine Swelling   MEDICATIONS: Current Outpatient Prescriptions  Medication Sig Dispense Refill  . aspirin 81 MG chewable tablet Chew 81 mg by mouth daily.      Marland Kitchen atorvastatin (LIPITOR) 40 MG tablet Take 40 mg by mouth daily.      . bisacodyl (DULCOLAX) 5 MG EC tablet Take 5 mg by mouth daily as needed. For constipation      . calcium carbonate (OS-CAL) 600 MG TABS Take 600 mg by mouth 2 (two) times daily with a meal.        . calcium carbonate (TUMS - DOSED IN MG ELEMENTAL CALCIUM) 500 MG chewable tablet Chew 1 tablet by mouth 2 (two) times daily. Take 1 day before and 1 day after chemo treatment      . carvedilol (COREG) 12.5 MG tablet Take 12.5 mg by mouth 2 (two) times daily with a meal.      .  citalopram (CELEXA) 20 MG tablet Take 20 mg by mouth daily.      . clopidogrel (PLAVIX) 75 MG tablet Take 75 mg by mouth daily.      . cyanocobalamin (,VITAMIN B-12,) 1000 MCG/ML injection Inject 1,000 mcg into the muscle every 30 (thirty) days. Last dose 03/09/2011      . denosumab (XGEVA) 120 MG/1.7ML SOLN Inject 120 mg into the skin See admin instructions. Inject every 2 months. Last dose 05/04/2011      . gabapentin (NEURONTIN) 300 MG capsule Take 1 capsule (300 mg total) by mouth at bedtime as needed.  30 capsule  5  . hydrochlorothiazide (MICROZIDE) 12.5 MG capsule Take 1 capsule (12.5 mg total) by mouth daily.  30 capsule  6  . ibuprofen (ADVIL) 200 MG tablet Take 200 mg by mouth 3 (three) times daily.        . insulin glargine (LANTUS) 100 UNIT/ML  injection Inject 60 Units into the skin at bedtime.       Marland Kitchen letrozole (FEMARA) 2.5 MG tablet Take 1 tablet (2.5 mg total) by mouth daily.  30 tablet  11  . levothyroxine (SYNTHROID) 200 MCG tablet Take 1 tablet (200 mcg total) by mouth daily.  30 tablet  11  . levothyroxine (SYNTHROID) 75 MCG tablet Take 1 tablet (75 mcg total) by mouth daily. Please take with tablet  30 tablet  11  . LORazepam (ATIVAN) 0.5 MG tablet Take 1 tab by mouth twice a day as needed for anxiety  60 tablet  0  . losartan (COZAAR) 50 MG tablet Take 1 tablet (50 mg total) by mouth daily.  30 tablet  6  . metFORMIN (GLUCOPHAGE) 500 MG tablet Take 500 mg by mouth every morning.      Marland Kitchen omeprazole (PRILOSEC) 20 MG capsule Take 1 capsule (20 mg total) by mouth 2 (two) times daily.  60 capsule  11  . oxyCODONE-acetaminophen (PERCOCET) 5-325 MG per tablet Take 1 tablet by mouth 4 (four) times daily as needed. For pain  120 tablet  0  . penicillin v potassium (VEETID) 500 MG tablet Take 1 tablet (500 mg total) by mouth 4 (four) times daily.  120 tablet  0  . DISCONTD: simvastatin (ZOCOR) 40 MG tablet Take 40 mg by mouth every evening.         DENTAL EXAM: General: Patient is a well-developed, well-nourished female in no acute distress. Vitals: As above. Extraoral Exam: No obvious facial swelling is noted.  No obvious lymphadenopathy is noted. Intraoral  Exam: Normal Saliva. There is purulence coming from the exposed bone involving the upper right quadrant buccal aspect of #4. The exposed bone now measures 10 mm x 5 mm. No significant palatal swelling is noted. Dentition: As before.  Assessments: 1. Persistent exposed bone and delayed healing area #4 with purulence and some pain associated with the area of exposed bone.  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  Plan/Procedures:  1. I discussed today's findings with Oral surgeon, Dr. Dutch Quint.  He recommended switch  to amoxicillin 500mg  three times daily for one month due to better oral absorption of the amoxicillin versus penicillin.    He felt that extended Augmentin therapy would put the patient at risk for increased GI side effects and possible pseudomembranous colitis.  He also agreed with use of chlorhexidine oral rinse at 0.12% three times daily.  He recommended referral to [facility] Dentistry -Oral surgery  Department for discussion of other treatment options as well. I then discussed the risks, benefits and complications of various treatment options with the patient. She agreed to switch to amoxicillin antibiotic therapy and the use of chlorhexidine rinse 0.12% three time daily after a test dose today.  She also agreed  to be referred to [facility] Dentistry for evaluation of other treatment options.  2. A test dose of chlorhexidine 0.12 was provided today with no untoward effects. Vitals were monitored and no apparent problems occurred.  Patient to call if problems arise and has Benadryl elixir to take if allergic symptoms arise. Patient to go to emergency department or call 911 if acute respiratory problems, swellings or shortness of breath occurs. Patient was followed for 45 minutes and dismissed to care of her husband without problems.  Charlynne Pander, DDS 07/03/2011  Addendum: Patient was contacted at home at approximately 4:30 PM. Patient had no problems with swelling, itching, shortness of breath or other allergic reaction manifestations at that time.   Charlynne Pander, DDS

## 2011-07-04 ENCOUNTER — Telehealth (HOSPITAL_COMMUNITY): Payer: Self-pay | Admitting: Dentistry

## 2011-07-06 ENCOUNTER — Other Ambulatory Visit: Payer: Medicare Other | Admitting: Lab

## 2011-07-06 ENCOUNTER — Other Ambulatory Visit: Payer: Self-pay | Admitting: Oncology

## 2011-07-07 ENCOUNTER — Encounter: Payer: Self-pay | Admitting: Neurosurgery

## 2011-07-10 ENCOUNTER — Ambulatory Visit: Payer: Medicare Other | Admitting: Neurosurgery

## 2011-07-10 ENCOUNTER — Other Ambulatory Visit: Payer: Medicare Other

## 2011-07-10 ENCOUNTER — Ambulatory Visit: Payer: Medicare Other | Admitting: Surgery

## 2011-07-14 ENCOUNTER — Ambulatory Visit (HOSPITAL_BASED_OUTPATIENT_CLINIC_OR_DEPARTMENT_OTHER): Payer: Medicare Other | Admitting: Physician Assistant

## 2011-07-14 ENCOUNTER — Encounter: Payer: Self-pay | Admitting: Physician Assistant

## 2011-07-14 ENCOUNTER — Telehealth: Payer: Self-pay | Admitting: *Deleted

## 2011-07-14 ENCOUNTER — Other Ambulatory Visit (HOSPITAL_BASED_OUTPATIENT_CLINIC_OR_DEPARTMENT_OTHER): Payer: Medicare Other | Admitting: Lab

## 2011-07-14 VITALS — BP 109/66 | HR 80 | Temp 97.7°F | Ht 63.0 in | Wt 263.6 lb

## 2011-07-14 DIAGNOSIS — C50919 Malignant neoplasm of unspecified site of unspecified female breast: Secondary | ICD-10-CM

## 2011-07-14 DIAGNOSIS — Z170 Estrogen receptor positive status [ER+]: Secondary | ICD-10-CM

## 2011-07-14 DIAGNOSIS — D510 Vitamin B12 deficiency anemia due to intrinsic factor deficiency: Secondary | ICD-10-CM

## 2011-07-14 DIAGNOSIS — D649 Anemia, unspecified: Secondary | ICD-10-CM

## 2011-07-14 DIAGNOSIS — C7951 Secondary malignant neoplasm of bone: Secondary | ICD-10-CM

## 2011-07-14 LAB — COMPREHENSIVE METABOLIC PANEL
CO2: 20 mEq/L (ref 19–32)
Calcium: 8.8 mg/dL (ref 8.4–10.5)
Creatinine, Ser: 1.24 mg/dL — ABNORMAL HIGH (ref 0.50–1.10)
Glucose, Bld: 334 mg/dL — ABNORMAL HIGH (ref 70–99)
Sodium: 136 mEq/L (ref 135–145)
Total Bilirubin: 0.3 mg/dL (ref 0.3–1.2)
Total Protein: 6.6 g/dL (ref 6.0–8.3)

## 2011-07-14 LAB — CBC WITH DIFFERENTIAL/PLATELET
Eosinophils Absolute: 0.3 10*3/uL (ref 0.0–0.5)
HCT: 29.1 % — ABNORMAL LOW (ref 34.8–46.6)
LYMPH%: 21.6 % (ref 14.0–49.7)
MONO#: 0.5 10*3/uL (ref 0.1–0.9)
NEUT#: 3.8 10*3/uL (ref 1.5–6.5)
NEUT%: 65.5 % (ref 38.4–76.8)
Platelets: 257 10*3/uL (ref 145–400)
WBC: 5.9 10*3/uL (ref 3.9–10.3)

## 2011-07-14 LAB — CANCER ANTIGEN 27.29: CA 27.29: 104 U/mL — ABNORMAL HIGH (ref 0–39)

## 2011-07-14 MED ORDER — CYANOCOBALAMIN 1000 MCG/ML IJ SOLN
1000.0000 ug | Freq: Once | INTRAMUSCULAR | Status: AC
  Administered 2011-07-14: 1000 ug via INTRAMUSCULAR

## 2011-07-14 NOTE — Progress Notes (Signed)
ID: Brittney Cunningham   DOB: 1940-03-28  MR#: [id number]  ONG#:[id number]  HISTORY OF PRESENT ILLNESS: Brittney Cunningham was diagnosed with right breast carcinoma in May of 2011, a biopsy showing a grade 2 invasive ductal carcinoma, T2 NXM1, stage IV at presentation. There was bone only involvement. Tumor was strongly ER and PR positive, HER-2/neu negative, with an MIP-1 of 26%.  She was treated neoadjuvantly with letrozole and zoledronic acid beginning in June of 2011. She is status post right modified radical mastectomy in February 2012 for a ypT2 ypN2, grade 1 invasive ductal carcinoma. Margins were negative.  She received postmastectomy radiation therapy, completed in August of 2012.  Patient has continued on letrozole. She received zoledronic acid until she was switched to [facility] due to concerns regarding increasing serum creatinine. Now receiving Xgeva every 8 weeks.  Patient also has a history of pernicious anemia and receives vitamin B 12 injections here monthly.   INTERVAL HISTORY: Brittney Cunningham returns today for followup of her metastatic breast carcinoma. She continues on letrozole daily, 2.5 mg, with good tolerance. She continues to receive monthly B12 injections for pernicious anemia, with Xgeva given every 8 weeks for her bone metastasis. Her most recent Rivka Barbara was given on 05/04/2011.  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  When Brittney Cunningham was here one month ago, she had lost one of her right upper molars and had discomfort in 2 additional molars, one on the right lower and one on the left upper. We referred her to Dr. Kristin Bruins who has been treating her for osteonecrosis. She is on amoxicillin 3 times daily in addition to a chlorhexidine rinse 3 times daily. She does have some  discomfort in the upper right jaw for which she takes Advil. Dr. Kristin Bruins is referring her to the [facility] dental clinic for additional recommendations regarding treatment.   Brittney Cunningham  continues to have joint pain, back pain, and postsurgical pain. The gabapentin she is taking does seem to help somewhat with the postsurgical pain in the right chest wall. Her back and joints, however, continue to be very painful. She's been taking hydrocodone/APAP. She's tried taking only 3 tablets daily, 2 in the mornings and 1 at night, but finds that the afternoon is often "really terrible" due to the pain.  REVIEW OF SYSTEMS: Otherwise, Brittney Cunningham is very fatigued. She is followed by Dr. Ty Hilts for hypothyroidism, and they have been making some adjustments in her medications. She's had no fevers, chills, or night sweats. Brittney Cunningham continues to have joint pain, back pain, and postsurgical pain for which she takes gabapentin and hydrocodone/APAP affectively. She has had no nausea and no change in bowel habits. No chest pain, cough, or phlegm production, although she does have some shortness of breath with exertion. No orthopnea. No abnormal headaches or dizziness.  A detailed review of systems is otherwise noncontributory.  PAST MEDICAL HISTORY: Past Medical History  Diagnosis Date  . Anemia   . Depression   . Diabetes mellitus type II   . GERD (gastroesophageal reflux disease)   . Hyperlipidemia   . Hypothyroidism   . Osteoarthritis   . Carotid artery stenosis     bilateral  . CAD (coronary artery disease)   . OSA (obstructive sleep apnea)   . Breast cancer     right breast cancer-invasive  ductal carcinoma (StageIV)  . Hypertension   . Anxiety   . Carotid artery occlusion     PAST SURGICAL HISTORY:  Past Surgical History  Procedure Date  . Incisional breast biopsy     remoted left breast biopsy  . Cesarean section   . Other surgical history     GYN surgery  . Knee surgery     right knee surgery  . Carotid endarterectomy     right carotid  . Port-a-cath removal   . Modified mastectomy     Right breast  . Breast surgery   . Mastectomy     FAMILY HISTORY Family History    Problem Relation Age of Onset  . Arthritis    . Coronary artery disease      first degree relative  . Stroke      first degree relative  The patient's father died from pancreatic cancer at the age of 25. The patient's mother died from complications of diabetes and heart disease at the age of 40. The patient has 2 sisters and 2 brothers. There is no history of breast cancer or ovarian cancer in the immediate family. One of the patient's paternal aunts out of 6 paternal aunts had breast cancer diagnosed in her 74s.    GYNECOLOGIC HISTORY: The patient is GX, P3. First pregnancy to term age 75. She went through the change of life in her late 64s. She never took hormones.   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 Her husband of 43 years, [name], is disabled secondary to a fall. He has difficulty with walking. He used to work for [employer] previously. The patient's son, [name], is present today. He lives in [place], and works for [employer]. Daughter, [name], 65 years old, lives in [place], and works for [employer]. Daughter, [name], 75, lives in [place], [place], and works as a Statistician. The patient attends [employer].   ADVANCED DIRECTIVES:  HEALTH MAINTENANCE: History  Substance Use Topics  . Smoking status: Former Smoker -- 1.0 packs/day for 20 years    Types: Cigarettes    Quit date: 01/31/1988  . Smokeless tobacco: Never Used  . Alcohol Use: No     Colonoscopy:  PAP:  Bone density:  Lipid panel:  Allergies  Allergen Reactions  . Chlorhexidine Hives, Itching and Rash    This was most likely a CONTACT DERMATITIS versus true systemic allergic reaction  . Adhesive (Tape) Hives  . Codeine Swelling    Current Outpatient Prescriptions  Medication Sig Dispense Refill  . aspirin 81 MG chewable tablet Chew 81 mg by mouth daily.      Marland Kitchen atorvastatin (LIPITOR) 40 MG tablet Take 40 mg by mouth daily.      .  bisacodyl (DULCOLAX) 5 MG EC tablet Take 5 mg by mouth daily as needed. For constipation      . calcium carbonate (OS-CAL) 600 MG TABS Take 600 mg by mouth 2 (two) times daily with a meal.        . calcium carbonate (TUMS - DOSED IN MG ELEMENTAL CALCIUM) 500 MG chewable tablet Chew 1 tablet by mouth 2 (two) times daily. Take 1 day before and 1 day after chemo treatment      . carvedilol (COREG) 12.5 MG tablet Take 12.5 mg by mouth 2 (two) times daily with a meal.      . chlorhexidine (PERIDEX) 0.12 % solution Rinse for 30 seconds  with 15 mLs three times daily. Spit out excess. Do NOT swallow.  960 mL  12  . citalopram (CELEXA) 20 MG tablet Take  20 mg by mouth daily.      . clopidogrel (PLAVIX) 75 MG tablet Take 75 mg by mouth daily.      . cyanocobalamin (,VITAMIN B-12,) 1000 MCG/ML injection Inject 1,000 mcg into the muscle every 30 (thirty) days. Last dose 03/09/2011      . gabapentin (NEURONTIN) 300 MG capsule Take 1 capsule (300 mg total) by mouth at bedtime as needed.  30 capsule  5  . hydrochlorothiazide (MICROZIDE) 12.5 MG capsule Take 1 capsule (12.5 mg total) by mouth daily.  30 capsule  6  . ibuprofen (ADVIL) 200 MG tablet Take 200 mg by mouth 3 (three) times daily.        . insulin glargine (LANTUS) 100 UNIT/ML injection Inject 60 Units into the skin at bedtime.       Marland Kitchen letrozole (FEMARA) 2.5 MG tablet Take 1 tablet (2.5 mg total) by mouth daily.  30 tablet  11  . levothyroxine (SYNTHROID) 200 MCG tablet Take 1 tablet (200 mcg total) by mouth daily.  30 tablet  11  . levothyroxine (SYNTHROID) 75 MCG tablet Take 1 tablet (75 mcg total) by mouth daily. Please take with tablet  30 tablet  11  . LORazepam (ATIVAN) 0.5 MG tablet Take 1 tab by mouth twice a day as needed for anxiety  60 tablet  0  . losartan (COZAAR) 50 MG tablet Take 1 tablet (50 mg total) by mouth daily.  30 tablet  6  . metFORMIN (GLUCOPHAGE) 500 MG tablet Take 500 mg by mouth every morning.      Marland Kitchen omeprazole  (PRILOSEC) 20 MG capsule Take 1 capsule (20 mg total) by mouth 2 (two) times daily.  60 capsule  11  . oxyCODONE-acetaminophen (PERCOCET) 5-325 MG per tablet Take 1 tablet by mouth 4 (four) times daily as needed. For pain  120 tablet  0  . amoxicillin (AMOXIL) 500 MG capsule Take one capsule every 8 hours for 30 days.  90 capsule  0  . denosumab (XGEVA) 120 MG/1.7ML SOLN Inject 120 mg into the skin See admin instructions. Inject every 2 months. Last dose 05/04/2011      . DISCONTD: simvastatin (ZOCOR) 40 MG tablet Take 40 mg by mouth every evening.       Current Facility-Administered Medications  Medication Dose Route Frequency Provider Last Rate Last Dose  . cyanocobalamin ((VITAMIN B-12)) injection 1,000 mcg  1,000 mcg Intramuscular Once Lowella Dell, MD   1,000 mcg at 07/14/11 1131    OBJECTIVE: Elderly white female who appears comfortable and in no acute distress. Filed Vitals:   07/14/11 1029  BP: 109/66  Pulse: 80  Temp: 97.7 F (36.5 C)     Body mass index is 46.69 kg/(m^2).    ECOG FS: 2  Filed Weights   07/14/11 1029  Weight: 263 lb 9.6 oz (119.568 kg)   Physical Exam: HEENT:  Sclerae anicteric, conjunctivae pink.  Oropharynx shows poor dentition.  There is an area of exposed bone in the right upper jaw. Nodes:  No cervical, supraclavicular, or axillary lymphadenopathy palpated.  Breast Exam:  Status post right mastectomy with well-healed incision. No suspicious nodularity or skin changes. Lungs:  Clear to auscultation bilaterally.  No crackles, rhonchi, or wheezes.   Heart:  Regular rate and rhythm.   Abdomen:  Soft, obese, nontender.  Positive bowel sounds.  No organomegaly or masses palpated.   Musculoskeletal:  No focal spinal tenderness to palpation.  Extremities:  Benign.  No peripheral edema  or cyanosis.   Skin:  Benign.   Neuro:  Nonfocal. Alert and oriented x3.    LAB RESULTS: Lab Results  Component Value Date   WBC 5.9 07/14/2011   NEUTROABS 3.8  07/14/2011   HGB 9.2* 07/14/2011   HCT 29.1* 07/14/2011   MCV 88.5 07/14/2011   PLT 257 07/14/2011      Chemistry      Component Value Date/Time   NA 138 06/16/2011 0952   K 5.3 06/16/2011 0952   CL 107 06/16/2011 0952   CO2 21 06/16/2011 0952   BUN 33* 06/16/2011 0952   CREATININE 1.25* 06/16/2011 0952      Component Value Date/Time   CALCIUM 9.5 06/16/2011 0952   ALKPHOS 91 06/16/2011 0952   AST 9 06/16/2011 0952   ALT 8 06/16/2011 0952   BILITOT 0.3 06/16/2011 0952       Lab Results  Component Value Date   LABCA2 106* 06/16/2011    STUDIES:  Next mammogram due Oct 2013. Most recent bone scan 11/16/2010 was stable   ASSESSMENT: A 71 year old [place] woman  (1) status post right breast biopsy May of 2011 for a grade 2 invasive ductal carcinoma, T2 NX M1, Stage IV, with bone-only involvement, strongly estrogen and progesterone receptor-positive, HER-2-negative with an MIB-1 of 26%.  (2) Neoadjuvantly she received letrozole and zoledronic acid beginning in June of 2011 and underwent right modified radical mastectomy February of 2012 for a ypT2 ypN2, grade 1 invasive ductal carcinoma with negative margins.  (3) She completed radiation therapy in August of 2012.  (4) She has continued on letrozole but was switched from zoledronic acid to [facility] because of concerns regarding her serum creatinine. Rivka Barbara is being given every 8 weeks. (5) She receives B12 injections monthly.  (6)  recent diagnosis of osteonecrosis of the jaw.  PLAN: This case was reviewed with Dr. Darnelle Catalan, and we both agree that we will hold Brittney Cunningham's  Xgeva at this point, and in fact indefinitely, pending additional dental recommendations. She will continue to be followed by both Dr. Kristin Bruins and the [facility] as noted above.   Brittney Cunningham will receive her vitamin B 12 injection here in the office today, and will continue on letrozole as before. She will continue to receive her B12 injections here on a monthly basis, and  will see Dr. Darnelle Catalan in approximately 8-10 weeks for reassessment. Brittney Cunningham voices understanding and agreement with this plan, and of course knows to call if she has any changes or problems.    Wisam Siefring    07/14/2011

## 2011-07-14 NOTE — Telephone Encounter (Signed)
gave patient appointment 09-28-2011 starting at 9:00am 10-13-2011 starting at lab and flush startng at 9:00am printed out calendar and gave to the patient

## 2011-07-17 ENCOUNTER — Ambulatory Visit (HOSPITAL_COMMUNITY): Payer: Self-pay | Admitting: Dentistry

## 2011-07-17 ENCOUNTER — Other Ambulatory Visit: Payer: Self-pay | Admitting: *Deleted

## 2011-07-17 ENCOUNTER — Encounter (HOSPITAL_COMMUNITY): Payer: Self-pay | Admitting: Dentistry

## 2011-07-17 VITALS — BP 167/65 | HR 78 | Temp 97.6°F

## 2011-07-17 DIAGNOSIS — K1370 Unspecified lesions of oral mucosa: Secondary | ICD-10-CM

## 2011-07-17 DIAGNOSIS — M272 Inflammatory conditions of jaws: Secondary | ICD-10-CM

## 2011-07-17 DIAGNOSIS — K0530 Chronic periodontitis, unspecified: Secondary | ICD-10-CM

## 2011-07-17 DIAGNOSIS — T148XXD Other injury of unspecified body region, subsequent encounter: Secondary | ICD-10-CM

## 2011-07-17 DIAGNOSIS — K036 Deposits [accretions] on teeth: Secondary | ICD-10-CM

## 2011-07-17 DIAGNOSIS — T8189XA Other complications of procedures, not elsewhere classified, initial encounter: Secondary | ICD-10-CM

## 2011-07-17 DIAGNOSIS — C50911 Malignant neoplasm of unspecified site of right female breast: Secondary | ICD-10-CM

## 2011-07-17 DIAGNOSIS — M278 Other specified diseases of jaws: Secondary | ICD-10-CM

## 2011-07-17 DIAGNOSIS — K121 Other forms of stomatitis: Secondary | ICD-10-CM

## 2011-07-17 MED ORDER — LORAZEPAM 0.5 MG PO TABS: ORAL_TABLET | ORAL | Status: DC

## 2011-07-17 NOTE — Progress Notes (Signed)
07/17/2011  Patient:            Brittney Cunningham Date of Birth:  [birth year] MRN:                [id number]  BP 167/65  Pulse 78  Temp 97.6 F (36.4 C) (Oral)  Brittney Cunningham is a 71 year old female that presents for re-evaluation of exposed bone from upper right quadrant area of tooth #4. Patient provided a history of having a maxillary right tooth "fall out" approximately 06/12/2011. Patient indicates that the tooth "came out whole". Patient then presented on 06/19/2011 to dental medicine for dental consultation . Patient had been complaining of upper left and lower right quadrant tooth pain. Patient denied the pain from the upper right quadrant at that time. During that examination on 06/19/2011, I noted exposed bone involving the upper right quadrant area #4 that measured 5 mm x 3 mm with purulent exudate in that area. Patient was prescribed penicillin VK 500 mg 4 times daily at that time. I did NOT prescribe chlorhexidine rinse 0.12% due to a questionable allergic reaction with chlorhexidine scrub for a surgical procedure. Subsequently the surgeon, Dr. Dwain Sarna, indicated that this most likely represented a contact dermatitis with redness to the area where the scrub was applied. He did not feel that this was an allergic reaction. The patient was re-evaluated on 07/03/11. Patient found to have persistent purulence and exposed bone that now measured 5 mm X 10 mm.   A test dose of chlorhexidine rinse at a 0.12% strength was given with no allergic manifestations neither acutely nor since that time. Patient continues use of chlorhexidine rinses three times a day. Antibiotics were also changed to Amoxicilln 500 mg every 8 hours at that appointment per recommendation of Dr. Chales Salmon (oral surgeon). The patient was also referred to Dr. Vertell Limber in [place], [place] for alternative treatment options. Patient does not have an appointment with Dr. Mauri Pole at this time. Patient now presents for re-evaluation of  the exposed bone.  Patient denies any acute dental pain today. Patient does give a history of new oral ulceration involving the right buccal mucosa in the area of the exposed bone.  Medical Hx :  Past Medical History  Diagnosis Date  . Anemia   . Depression   . Diabetes mellitus type II   . GERD (gastroesophageal reflux disease)   . Hyperlipidemia   . Hypothyroidism   . Osteoarthritis   . Carotid artery stenosis     bilateral  . CAD (coronary artery disease)   . OSA (obstructive sleep apnea)   . Breast cancer     right breast cancer-invasive  ductal carcinoma (StageIV)  . Hypertension   . Anxiety   . Carotid artery occlusion   . ALLERGIES/ADVERSE DRUG REACTIONS: Allergies  Allergen Reactions  . Chlorhexidine Hives, Itching and Rash    This was most likely a CONTACT DERMATITIS versus true systemic allergic reaction  . Adhesive (Tape) Hives  . Codeine Swelling   MEDICATIONS: Current Outpatient Prescriptions  Medication Sig Dispense Refill  . aspirin 81 MG chewable tablet Chew 81 mg by mouth daily.      Marland Kitchen atorvastatin (LIPITOR) 40 MG tablet Take 40 mg by mouth daily.      . bisacodyl (DULCOLAX) 5 MG EC tablet Take 5 mg by mouth daily as needed. For constipation      . calcium carbonate (OS-CAL) 600 MG TABS Take 600 mg by mouth 2 (two) times daily  with a meal.        . calcium carbonate (TUMS - DOSED IN MG ELEMENTAL CALCIUM) 500 MG chewable tablet Chew 1 tablet by mouth 2 (two) times daily. Take 1 day before and 1 day after chemo treatment      . carvedilol (COREG) 12.5 MG tablet Take 12.5 mg by mouth 2 (two) times daily with a meal.      . chlorhexidine (PERIDEX) 0.12 % solution Rinse for 30 seconds  with 15 mLs three times daily. Spit out excess. Do NOT swallow.  960 mL  12  . citalopram (CELEXA) 20 MG tablet Take 20 mg by mouth daily.      . clopidogrel (PLAVIX) 75 MG tablet Take 75 mg by mouth daily.      . cyanocobalamin (,VITAMIN B-12,) 1000 MCG/ML injection Inject 1,000  mcg into the muscle every 30 (thirty) days. Last dose 03/09/2011      . denosumab (XGEVA) 120 MG/1.7ML SOLN Inject 120 mg into the skin See admin instructions. Inject every 2 months. Last dose 05/04/2011      . gabapentin (NEURONTIN) 300 MG capsule Take 1 capsule (300 mg total) by mouth at bedtime as needed.  30 capsule  5  . hydrochlorothiazide (MICROZIDE) 12.5 MG capsule Take 1 capsule (12.5 mg total) by mouth daily.  30 capsule  6  . ibuprofen (ADVIL) 200 MG tablet Take 200 mg by mouth 3 (three) times daily.        . insulin glargine (LANTUS) 100 UNIT/ML injection Inject 60 Units into the skin at bedtime.       Marland Kitchen letrozole (FEMARA) 2.5 MG tablet Take 1 tablet (2.5 mg total) by mouth daily.  30 tablet  11  . levothyroxine (SYNTHROID) 200 MCG tablet Take 1 tablet (200 mcg total) by mouth daily.  30 tablet  11  . levothyroxine (SYNTHROID) 75 MCG tablet Take 1 tablet (75 mcg total) by mouth daily. Please take with tablet  30 tablet  11  . LORazepam (ATIVAN) 0.5 MG tablet Take 1 tab by mouth twice a day as needed for anxiety  60 tablet  0  . losartan (COZAAR) 50 MG tablet Take 1 tablet (50 mg total) by mouth daily.  30 tablet  6  . metFORMIN (GLUCOPHAGE) 500 MG tablet Take 500 mg by mouth every morning.      Marland Kitchen omeprazole (PRILOSEC) 20 MG capsule Take 1 capsule (20 mg total) by mouth 2 (two) times daily.  60 capsule  11  . oxyCODONE-acetaminophen (PERCOCET) 5-325 MG per tablet Take 1 tablet by mouth 4 (four) times daily as needed. For pain  120 tablet  0  . DISCONTD: simvastatin (ZOCOR) 40 MG tablet Take 40 mg by mouth every evening.         DENTAL EXAM: General: Patient is a well-developed, well-nourished female in no acute distress. Vitals:  BP 167/65  Pulse 78  Temp 97.6 F (36.4 C) (Oral). Extraoral Exam: No obvious facial swelling is noted.  No obvious lymphadenopathy is noted. Intraoral  Exam: Normal Saliva. There is purulence still coming from the exposed bone involving the upper  right quadrant buccal aspect of #4. The exposed bone now measures 12 mm x 6 mm. No significant palatal swelling is noted.  There is a 3 x 3 mm area of traumatic ulceration in the area the exposed bone involving the right buccal mucosa.  Dentition: As before. Periodontal: Patient has chronic periodontitis with plaque and calculus accumulations. I am waiting on  evaluation of treatment options at [facility] before proceeding with periodontal therapy.  Assessments: 1. Persistent exposed bone and delayed healing area #4 with purulence and some pain associated with the area of exposed bone.  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 2. Traumatic ulceration involving the right buccal mucosa associated with trauma from the exposed bone area #4. 3. Chronic periodontitis. 4. Accretions  Plan/Procedures:  1. I discussed the risks, benefits,  and complications associated with partial ostectomy/sequestrum today for the removal of the exposed bone that is causing a traumatic ulceration to the right buccal mucosa. Patient accepts risks and complications and agrees to proceed with the procedure today. Procedure: Patient was draped in the sterile fashion. Patient was administered a chlorhexidine rinse for 30 seconds. Topical anesthetic was applied to the tissues surrounding the exposed bone. The exposed bone was then removed with a rongeur and bone file without complications. The area was then irrigated with copious amounts sterile saline. The patient tolerated the procedure well. The patient expressed satisfaction with the result of the bone removal. 2. The patient is to continue her amoxicillin 500 mg therapy 3 times daily and a chlorhexidine rinses 3 times daily.  3. Patient to call dental medicine when Dr. Mauri Pole contact her for a dental consultation appointment in [place], [place] at the [facility]. 4. Patient is to return to clinic in approximately 2  weeks for reevaluation of the exposed bone. Patient to call if additional problems arise.   Charlynne Pander, DDS 07/17/2011  Addendum: Patient was contacted at home by Dr. Janeal Holmes office. Patient has appointment on July 10th at 9 AM. Charlynne Pander, DDS

## 2011-07-21 ENCOUNTER — Telehealth: Payer: Self-pay | Admitting: *Deleted

## 2011-07-21 NOTE — Telephone Encounter (Signed)
Release of Information Form and for prescription pickup signed by pt 12/30/2010   She has given permission for these  people to pick up her prescriptions.  Oscar Brandow(husband) and Addison Naegeli Aheron(sister)

## 2011-07-24 ENCOUNTER — Telehealth: Payer: Self-pay | Admitting: Internal Medicine

## 2011-07-24 MED ORDER — ATORVASTATIN CALCIUM 40 MG PO TABS: 40.0000 mg | ORAL_TABLET | Freq: Every day | ORAL | Status: DC

## 2011-07-24 NOTE — Telephone Encounter (Signed)
Rx refill sent to pharmacy. 

## 2011-07-24 NOTE — Telephone Encounter (Signed)
Refill- atorvastatin calcium 40mg  tab. Take one tablet by mouth every day. Qty 30 last fill 5.18.13

## 2011-07-27 ENCOUNTER — Other Ambulatory Visit: Payer: Medicare Other

## 2011-07-27 ENCOUNTER — Ambulatory Visit: Payer: Medicare Other

## 2011-07-28 ENCOUNTER — Encounter: Payer: Self-pay | Admitting: Surgery

## 2011-07-31 ENCOUNTER — Other Ambulatory Visit (INDEPENDENT_AMBULATORY_CARE_PROVIDER_SITE_OTHER): Payer: Medicare Other | Admitting: *Deleted

## 2011-07-31 ENCOUNTER — Ambulatory Visit (INDEPENDENT_AMBULATORY_CARE_PROVIDER_SITE_OTHER): Payer: Medicare Other | Admitting: Surgery

## 2011-07-31 ENCOUNTER — Encounter: Payer: Self-pay | Admitting: Surgery

## 2011-07-31 VITALS — BP 139/50 | HR 80 | Resp 18 | Ht 63.0 in | Wt 264.2 lb

## 2011-07-31 DIAGNOSIS — Z48812 Encounter for surgical aftercare following surgery on the circulatory system: Secondary | ICD-10-CM

## 2011-07-31 DIAGNOSIS — I6529 Occlusion and stenosis of unspecified carotid artery: Secondary | ICD-10-CM

## 2011-07-31 NOTE — Progress Notes (Signed)
[facility] [place]   Patient name: Brittney Cunningham MRN: [id number] DOB: 10-May-1940 Sex: female     Chief Complaint  Patient presents with  . Carotid    3 month follow up w/ labs today    HISTORY OF PRESENT ILLNESS: The patient is back today for followup. She underwent left carotid stenting on 03/15/2011 for a high-grade stenosis. She continues to do well from her carotid perspective. She recently began having shortness of breath which has been attributed to acid reflux and has started taking a proton pump inhibitor. She continues to be on her Plavix. She denies having any neurologic symptoms.  Past Medical History  Diagnosis Date  . Anemia   . Depression   . Diabetes mellitus type II   . GERD (gastroesophageal reflux disease)   . Hyperlipidemia   . Hypothyroidism   . Osteoarthritis   . Carotid artery stenosis     bilateral  . CAD (coronary artery disease)   . OSA (obstructive sleep apnea)   . Breast cancer     right breast cancer-invasive  ductal carcinoma (StageIV)  . Hypertension   . Anxiety   . Carotid artery occlusion     Past Surgical History  Procedure Date  . Incisional breast biopsy     remoted left breast biopsy  . Cesarean section   . Other surgical history     GYN surgery  . Knee surgery     right knee surgery  . Carotid endarterectomy     right carotid  . Port-a-cath removal   . Modified mastectomy     Right breast  . Breast surgery   . Mastectomy     History   Social History  . Marital Status: Married    Spouse Name: N/A    Number of Children: 3  . Years of Education: N/A   Occupational History  . 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 Smoking status: Former Smoker -- 1.0 packs/day for 20 years    Types: Cigarettes    Quit date: 01/31/1988  . Smokeless tobacco: Never Used  . Alcohol Use: No  . Drug Use: No  . Sexually Active: Not on file   Other Topics Concern  . Not on file    Social History Narrative   Retired-ran a daycare facility for 40 years.Married- 43 years 2 sons 1 daughter Former smoker-quit in 16.     Family History  Problem Relation Age of Onset  . Arthritis    . Coronary artery disease      first degree relative  . Stroke      first degree relative  . Diabetes Mother   . Heart disease Mother   . Hypertension Mother     Allergies as of 07/31/2011 - Review Complete 07/31/2011  Allergen Reaction Noted  . Chlorhexidine Hives, Itching, and Rash 01/05/2011  . Adhesive (tape) Hives 03/06/2011  . Codeine Swelling     Current Outpatient Prescriptions on File Prior to Visit  Medication Sig Dispense Refill  . aspirin 81 MG chewable tablet Chew 81 mg by mouth daily.      Marland Kitchen atorvastatin (LIPITOR) 40 MG tablet Take 1 tablet (40 mg total) by mouth daily.  90 tablet  1  . bisacodyl (DULCOLAX) 5 MG EC tablet Take 5 mg by mouth daily as needed. For constipation      . calcium carbonate (OS-CAL) 600 MG TABS Take 600 mg  by mouth 2 (two) times daily with a meal.        . calcium carbonate (TUMS - DOSED IN MG ELEMENTAL CALCIUM) 500 MG chewable tablet Chew 1 tablet by mouth 2 (two) times daily. Take 1 day before and 1 day after chemo treatment      . carvedilol (COREG) 12.5 MG tablet Take 12.5 mg by mouth 2 (two) times daily with a meal.      . chlorhexidine (PERIDEX) 0.12 % solution Rinse for 30 seconds  with 15 mLs three times daily. Spit out excess. Do NOT swallow.  960 mL  12  . citalopram (CELEXA) 20 MG tablet Take 20 mg by mouth daily.      . clopidogrel (PLAVIX) 75 MG tablet Take 75 mg by mouth daily.      . cyanocobalamin (,VITAMIN B-12,) 1000 MCG/ML injection Inject 1,000 mcg into the muscle every 30 (thirty) days. Last dose 03/09/2011      . gabapentin (NEURONTIN) 300 MG capsule Take 1 capsule (300 mg total) by mouth at bedtime as needed.  30 capsule  5  . hydrochlorothiazide (MICROZIDE) 12.5 MG capsule Take 1 capsule (12.5 mg total) by mouth daily.   30 capsule  6  . ibuprofen (ADVIL) 200 MG tablet Take 200 mg by mouth 3 (three) times daily.        . insulin glargine (LANTUS) 100 UNIT/ML injection Inject 60 Units into the skin at bedtime.       Marland Kitchen letrozole (FEMARA) 2.5 MG tablet Take 1 tablet (2.5 mg total) by mouth daily.  30 tablet  11  . levothyroxine (SYNTHROID) 200 MCG tablet Take 1 tablet (200 mcg total) by mouth daily.  30 tablet  11  . levothyroxine (SYNTHROID) 75 MCG tablet Take 1 tablet (75 mcg total) by mouth daily. Please take with tablet  30 tablet  11  . LORazepam (ATIVAN) 0.5 MG tablet Take 1 tab by mouth twice a day as needed for anxiety  60 tablet  0  . losartan (COZAAR) 50 MG tablet Take 1 tablet (50 mg total) by mouth daily.  30 tablet  6  . metFORMIN (GLUCOPHAGE) 500 MG tablet Take 500 mg by mouth every morning.      Marland Kitchen omeprazole (PRILOSEC) 20 MG capsule Take 1 capsule (20 mg total) by mouth 2 (two) times daily.  60 capsule  11  . oxyCODONE-acetaminophen (PERCOCET) 5-325 MG per tablet Take 1 tablet by mouth 4 (four) times daily as needed. For pain  120 tablet  0  . denosumab (XGEVA) 120 MG/1.7ML SOLN Inject 120 mg into the skin See admin instructions. Inject every 2 months. Last dose 05/04/2011      . DISCONTD: simvastatin (ZOCOR) 40 MG tablet Take 40 mg by mouth every evening.         REVIEW OF SYSTEMS: Positive for oral issues secondary to her chemotherapy. Positive for acid reflux. All of systems negative the  PHYSICAL EXAMINATION:   Vital signs are BP 139/50  Pulse 80  Resp 18  Ht 5\' 3"  (1.6 m)  Wt 264 lb 3.2 oz (119.84 kg)  BMI 46.80 kg/m2  SpO2 91% General: The patient appears their stated age. HEENT:  No gross abnormalities Pulmonary:  Non labored breathing Abdomen: Soft and non-tender Musculoskeletal: There are no major deformities. Neurologic: No focal weakness or paresthesias are detected, Skin: There are no ulcer or rashes noted. Psychiatric: The patient has normal affect. Cardiovascular:  There is a regular rate and rhythm without  significant murmur appreciated. No carotid bruits   Diagnostic Studies Carotid duplex shows minimal stenosis in the right carotid endarterectomy plane. The left internal carotid stent is patent. Shadowing prevented accurate evaluation but distal waveforms were normal.  Assessment: Status post right carotid endarterectomy, left carotid stent, asymptomatic Plan: From my perspective, the patient continues to do very well. I will see her back in one year for repeat ultrasound. I did tell her that taking a proton pump inhibitor and Plavix is not ideal. I've asked her to try Zantac to see if this helps her acid reflux. If it does not then she only gets benefit from a proton pump inhibitor, I would consider discontinuing her Plavix and placing her on a full strength aspirin  V. Charlena Cross, M.D. [facility] [place] Office: [phone] Pager:  [phone]

## 2011-08-04 ENCOUNTER — Other Ambulatory Visit: Payer: Medicare Other | Admitting: Lab

## 2011-08-07 NOTE — Procedures (Unsigned)
CAROTID DUPLEX EXAM  INDICATION:  Followup right CEA/left ICA stent  HISTORY: Diabetes:  Yes Cardiac:  No Hypertension:  Yes Smoking:  Previous Previous Surgery:  Right CEA 05/28/2009; left ICA stent 01/2011 CV History: Amaurosis Fugax No, Paresthesias No, Hemiparesis No                                      RIGHT             LEFT Brachial systolic pressure:         Mastectomy        139 Brachial Doppler waveforms:         NA                WNL Vertebral direction of flow:        Retrograde        Antegrade DUPLEX VELOCITIES (cm/sec) CCA peak systolic                   190               124 ECA peak systolic                   337               161 ICA peak systolic                   123               115 ICA end diastolic                   19                23 PLAQUE MORPHOLOGY:                  Heterogeneous     NA PLAQUE AMOUNT:                      Mild              NA PLAQUE LOCATION:                    CEA/ECA           NA  IMPRESSION: 1. 1%-39% plaquing of the right carotid endarterectomy. 2. Significant stenosis observed in the right external carotid artery. 3. Right vertebral artery is retrograde. 4. Right subclavian artery is monophasic in the mid segment.  The     proximal segment cannot be visualized. 5. Left internal carotid artery stenosis is patent.  Stent shadowing     previous accurate evaluation.  However, distal waveforms are within     normal limits. 6. Left vertebral artery and subclavian arteries are within normal     limits.  ___________________________________________ V. Charlena Cross, MD  LT/MEDQ  D:  07/31/2011  T:  07/31/2011  Job:  [id number]

## 2011-08-11 ENCOUNTER — Other Ambulatory Visit: Payer: Self-pay | Admitting: *Deleted

## 2011-08-11 ENCOUNTER — Ambulatory Visit (HOSPITAL_BASED_OUTPATIENT_CLINIC_OR_DEPARTMENT_OTHER): Payer: Medicare Other

## 2011-08-11 ENCOUNTER — Other Ambulatory Visit (HOSPITAL_BASED_OUTPATIENT_CLINIC_OR_DEPARTMENT_OTHER): Payer: Medicare Other | Admitting: Lab

## 2011-08-11 VITALS — BP 152/73 | HR 68 | Temp 98.7°F

## 2011-08-11 DIAGNOSIS — C7951 Secondary malignant neoplasm of bone: Secondary | ICD-10-CM

## 2011-08-11 DIAGNOSIS — C50919 Malignant neoplasm of unspecified site of unspecified female breast: Secondary | ICD-10-CM

## 2011-08-11 DIAGNOSIS — C50419 Malignant neoplasm of upper-outer quadrant of unspecified female breast: Secondary | ICD-10-CM

## 2011-08-11 LAB — COMPREHENSIVE METABOLIC PANEL
ALT: 11 U/L (ref 0–35)
AST: 12 U/L (ref 0–37)
Alkaline Phosphatase: 90 U/L (ref 39–117)
BUN: 24 mg/dL — ABNORMAL HIGH (ref 6–23)
Calcium: 8.7 mg/dL (ref 8.4–10.5)
Creatinine, Ser: 1 mg/dL (ref 0.50–1.10)
Total Bilirubin: 0.3 mg/dL (ref 0.3–1.2)

## 2011-08-11 LAB — CBC WITH DIFFERENTIAL/PLATELET
BASO%: 0.6 % (ref 0.0–2.0)
HCT: 28.7 % — ABNORMAL LOW (ref 34.8–46.6)
MCHC: 31.9 g/dL (ref 31.5–36.0)
MONO#: 0.6 10*3/uL (ref 0.1–0.9)
NEUT%: 59.2 % (ref 38.4–76.8)
RDW: 16.3 % — ABNORMAL HIGH (ref 11.2–14.5)
WBC: 6.1 10*3/uL (ref 3.9–10.3)
lymph#: 1.7 10*3/uL (ref 0.9–3.3)

## 2011-08-11 MED ORDER — SODIUM CHLORIDE 0.9 % IJ SOLN
10.0000 mL | INTRAMUSCULAR | Status: DC | PRN
  Administered 2011-08-11: 10 mL via INTRAVENOUS
  Filled 2011-08-11: qty 10

## 2011-08-11 MED ORDER — HEPARIN SOD (PORK) LOCK FLUSH 100 UNIT/ML IV SOLN
500.0000 [IU] | Freq: Once | INTRAVENOUS | Status: AC
  Administered 2011-08-11: 500 [IU] via INTRAVENOUS
  Filled 2011-08-11: qty 5

## 2011-08-11 MED ORDER — OXYCODONE-ACETAMINOPHEN 5-325 MG PO TABS: 1.0000 | ORAL_TABLET | Freq: Four times a day (QID) | ORAL | Status: DC | PRN

## 2011-08-21 ENCOUNTER — Telehealth: Payer: Self-pay | Admitting: Internal Medicine

## 2011-08-21 ENCOUNTER — Other Ambulatory Visit: Payer: Self-pay | Admitting: *Deleted

## 2011-08-21 DIAGNOSIS — C50911 Malignant neoplasm of unspecified site of right female breast: Secondary | ICD-10-CM

## 2011-08-21 MED ORDER — METFORMIN HCL 500 MG PO TABS: 500.0000 mg | ORAL_TABLET | Freq: Every morning | ORAL | Status: DC

## 2011-08-21 MED ORDER — LORAZEPAM 0.5 MG PO TABS: ORAL_TABLET | ORAL | Status: DC

## 2011-08-21 NOTE — Telephone Encounter (Signed)
Refill-metformin hcl 500mg  tab. Take one tablet by mouth every day with breakfast. Qty 30 last fill 6.22.13

## 2011-08-21 NOTE — Telephone Encounter (Signed)
Rx Done/SLS 

## 2011-08-22 ENCOUNTER — Other Ambulatory Visit: Payer: Self-pay

## 2011-08-22 DIAGNOSIS — C50919 Malignant neoplasm of unspecified site of unspecified female breast: Secondary | ICD-10-CM

## 2011-08-22 MED ORDER — CITALOPRAM HYDROBROMIDE 20 MG PO TABS: 20.0000 mg | ORAL_TABLET | Freq: Every day | ORAL | Status: DC

## 2011-08-24 ENCOUNTER — Other Ambulatory Visit: Payer: Medicare Other | Admitting: Lab

## 2011-08-24 ENCOUNTER — Ambulatory Visit: Payer: Medicare Other | Admitting: Physician Assistant

## 2011-08-31 ENCOUNTER — Other Ambulatory Visit: Payer: Medicare Other | Admitting: Lab

## 2011-09-08 ENCOUNTER — Other Ambulatory Visit: Payer: Medicare Other | Admitting: Lab

## 2011-09-11 ENCOUNTER — Other Ambulatory Visit: Payer: Medicare Other

## 2011-09-11 ENCOUNTER — Telehealth: Payer: Self-pay | Admitting: Cardiology

## 2011-09-11 ENCOUNTER — Ambulatory Visit: Payer: Self-pay | Admitting: Surgery

## 2011-09-11 NOTE — Telephone Encounter (Signed)
Patient called stated she has been sob for about 2 months.States she thought it was acid reflux.States it is not getting better and is going on vacation and would like appointment.Appointment scheduled with Norma Fredrickson NP 09/12/11.

## 2011-09-11 NOTE — Telephone Encounter (Signed)
New Problem:    Patient called in wanting to schedule an appointment to see [name] because she is having SOB.  Please call back.

## 2011-09-12 ENCOUNTER — Ambulatory Visit (INDEPENDENT_AMBULATORY_CARE_PROVIDER_SITE_OTHER): Payer: Medicare Other | Admitting: Nurse Practitioner

## 2011-09-12 ENCOUNTER — Encounter: Payer: Self-pay | Admitting: Nurse Practitioner

## 2011-09-12 VITALS — BP 174/68 | HR 97 | Ht 63.0 in | Wt 257.8 lb

## 2011-09-12 DIAGNOSIS — R0600 Dyspnea, unspecified: Secondary | ICD-10-CM | POA: Insufficient documentation

## 2011-09-12 DIAGNOSIS — C50919 Malignant neoplasm of unspecified site of unspecified female breast: Secondary | ICD-10-CM

## 2011-09-12 DIAGNOSIS — E785 Hyperlipidemia, unspecified: Secondary | ICD-10-CM

## 2011-09-12 DIAGNOSIS — Z79899 Other long term (current) drug therapy: Secondary | ICD-10-CM

## 2011-09-12 DIAGNOSIS — D649 Anemia, unspecified: Secondary | ICD-10-CM

## 2011-09-12 DIAGNOSIS — K219 Gastro-esophageal reflux disease without esophagitis: Secondary | ICD-10-CM

## 2011-09-12 DIAGNOSIS — R0609 Other forms of dyspnea: Secondary | ICD-10-CM

## 2011-09-12 DIAGNOSIS — I10 Essential (primary) hypertension: Secondary | ICD-10-CM

## 2011-09-12 DIAGNOSIS — E039 Hypothyroidism, unspecified: Secondary | ICD-10-CM

## 2011-09-12 LAB — CBC WITH DIFFERENTIAL/PLATELET
Basophils Absolute: 0 10*3/uL (ref 0.0–0.1)
Basophils Relative: 0.3 % (ref 0.0–3.0)
Eosinophils Absolute: 0.2 10*3/uL (ref 0.0–0.7)
Eosinophils Relative: 2.7 % (ref 0.0–5.0)
HCT: 29.9 % — ABNORMAL LOW (ref 36.0–46.0)
Hemoglobin: 9.5 g/dL — ABNORMAL LOW (ref 12.0–15.0)
Lymphocytes Relative: 26.8 % (ref 12.0–46.0)
Lymphs Abs: 2.3 10*3/uL (ref 0.7–4.0)
MCHC: 31.8 g/dL (ref 30.0–36.0)
MCV: 84.6 fl (ref 78.0–100.0)
Monocytes Absolute: 0.7 10*3/uL (ref 0.1–1.0)
Monocytes Relative: 7.8 % (ref 3.0–12.0)
Neutro Abs: 5.4 10*3/uL (ref 1.4–7.7)
Neutrophils Relative %: 62.4 % (ref 43.0–77.0)
Platelets: 322 10*3/uL (ref 150.0–400.0)
RBC: 3.54 Mil/uL — ABNORMAL LOW (ref 3.87–5.11)
RDW: 16.3 % — ABNORMAL HIGH (ref 11.5–14.6)
WBC: 8.7 10*3/uL (ref 4.5–10.5)

## 2011-09-12 MED ORDER — CARVEDILOL 12.5 MG PO TABS: 12.5000 mg | ORAL_TABLET | Freq: Two times a day (BID) | ORAL | Status: DC

## 2011-09-12 MED ORDER — PANTOPRAZOLE SODIUM 40 MG PO TBEC: 40.0000 mg | DELAYED_RELEASE_TABLET | Freq: Every day | ORAL | Status: DC

## 2011-09-12 NOTE — Assessment & Plan Note (Signed)
Patient presents today with a multitude of issues. She is more short of breath. She has more reflux. She does have known CAD with single vessel RCA disease, felt to best be managed medically. She is anemic. She has been markedly hypothyroid. We are rechecking labs today. She may require referral back to GI. She could be a candidate for Aranesp. We are going to get an echo as well. I have stopped her Prilosec and will use Protonix 40 mg instead. [name] and I will see her back. She may require repeat stress testing but I would like Dr. Elvis Coil opinion. Her last study was abnormal and her cath was just 2 years ago. Will see what her echo and labs look like. Patient is agreeable to this plan and will call if any problems develop in the interim.

## 2011-09-12 NOTE — Progress Notes (Signed)
Brittney Cunningham Date of Birth: 09-04-1940 Medical Record #[id number]  History of Present Illness: Brittney Cunningham is seen back today for a work in visit. She is seen for [name]. She was last seen in April of 2011 with dyspnea and reflux. She had an abnormal stress test and had cardiac cath showing severe single vessel RCA disease with left to right collaterals and normal LV function. She was felt to be best managed medically. During that evaluation, she was found to have bilateral carotid disease. She has had subsequent right CEA and left carotid stenting per Dr. Myra Gianotti. She has since developed breast cancer, stage 4. She was on Zometa. 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 She sees Dr. Darnelle Catalan for her cancer follow up. She is on B12 shots.   She comes in today. She is here alone. She has not felt well for about 2 months. Her acid reflux has "come back". She has pressure on her chest if she lies down and feels better when upright. She is on Prilosec for about 3 weeks. Dr. Myra Gianotti was concerned about this with her Plavix.  It needs to be refilled. She is anemic. She does not know why. Never had screening colonoscopy and does not remember why she has not had that test. Her thyroid is markedly abnormal and she has not had it checked since the last test. She is quite fatigued. No real swelling. Not coughing. Has seen Dr. Loreta Ave in the remote past.   Current Outpatient Prescriptions on File Prior to Visit  Medication Sig Dispense Refill  . aspirin 81 MG chewable tablet Chew 81 mg by mouth daily.      Marland Kitchen atorvastatin (LIPITOR) 40 MG tablet Take 1 tablet (40 mg total) by mouth daily.  90 tablet  1  . bisacodyl (DULCOLAX) 5 MG EC tablet Take 5 mg by mouth daily as needed. For constipation      . calcium carbonate (OS-CAL) 600 MG TABS Take 600 mg by mouth 2 (two) times daily with a meal.        . citalopram (CELEXA) 20 MG tablet Take 1 tablet (20 mg total) by mouth  daily.  30 tablet  0  . clopidogrel (PLAVIX) 75 MG tablet Take 75 mg by mouth daily.      . cyanocobalamin (,VITAMIN B-12,) 1000 MCG/ML injection Inject 1,000 mcg into the muscle every 30 (thirty) days. Last dose 03/09/2011      . gabapentin (NEURONTIN) 300 MG capsule Take 1 capsule (300 mg total) by mouth at bedtime as needed.  30 capsule  5  . hydrochlorothiazide (MICROZIDE) 12.5 MG capsule Take 1 capsule (12.5 mg total) by mouth daily.  30 capsule  6  . ibuprofen (ADVIL) 200 MG tablet Take 200 mg by mouth 3 (three) times daily.        . insulin glargine (LANTUS) 100 UNIT/ML injection Inject 60 Units into the skin at bedtime.       Marland Kitchen letrozole (FEMARA) 2.5 MG tablet Take 1 tablet (2.5 mg total) by mouth daily.  30 tablet  11  . levothyroxine (SYNTHROID) 200 MCG tablet Take 1 tablet (200 mcg total) by mouth daily.  30 tablet  11  . levothyroxine (SYNTHROID) 75 MCG tablet Take 1 tablet (75 mcg total) by mouth daily. Please take with tablet  30 tablet  11  . LORazepam (ATIVAN) 0.5 MG tablet Take 1 tab by mouth twice a day as needed for  anxiety  60 tablet  0  . losartan (COZAAR) 50 MG tablet Take 1 tablet (50 mg total) by mouth daily.  30 tablet  6  . metFORMIN (GLUCOPHAGE) 500 MG tablet Take 1 tablet (500 mg total) by mouth every morning.  30 tablet  5  . oxyCODONE-acetaminophen (PERCOCET) 5-325 MG per tablet Take 1 tablet by mouth 4 (four) times daily as needed. For pain  120 tablet  0  . DISCONTD: carvedilol (COREG) 12.5 MG tablet Take 12.5 mg by mouth 2 (two) times daily with a meal.      . pantoprazole (PROTONIX) 40 MG tablet Take 1 tablet (40 mg total) by mouth daily.  30 tablet  11  . DISCONTD: simvastatin (ZOCOR) 40 MG tablet Take 40 mg by mouth every evening.        Allergies  Allergen Reactions  . Chlorhexidine Hives, Itching and Rash    This was most likely a CONTACT DERMATITIS versus true systemic allergic reaction  . Adhesive (Tape) Hives  . Codeine Swelling    Past Medical  History  Diagnosis Date  . Anemia   . Depression   . Diabetes mellitus type II   . GERD (gastroesophageal reflux disease)   . Hyperlipidemia   . Hypothyroidism   . Osteoarthritis   . Carotid artery stenosis     bilateral  . CAD (coronary artery disease)   . OSA (obstructive sleep apnea)   . Breast cancer     right breast cancer-invasive  ductal carcinoma (StageIV)  . Hypertension   . Anxiety   . Carotid artery occlusion     Past Surgical History  Procedure Date  . Incisional breast biopsy     remoted left breast biopsy  . Cesarean section   . Other surgical history     GYN surgery  . Knee surgery     right knee surgery  . Carotid endarterectomy     right carotid  . Port-a-cath removal   . Modified mastectomy     Right breast  . Breast surgery   . Mastectomy     History  Smoking status  . Former Smoker -- 1.0 packs/day for 20 years  . Types: Cigarettes  . Quit date: 01/31/1988  Smokeless tobacco  . Never Used    History  Alcohol Use No    Family History  Problem Relation Age of Onset  . Arthritis    . Coronary artery disease      first degree relative  . Stroke      first degree relative  . Diabetes Mother   . Heart disease Mother   . Hypertension Mother     Review of Systems: The review of systems is per the HPI.  All other systems were reviewed and are negative.  Physical Exam: BP 174/68  Pulse 97  Ht 5\' 3"  (1.6 m)  Wt 257 lb 12.8 oz (116.937 kg)  BMI 45.67 kg/m2 Patient is very pleasant and in no acute distress. She is obese. Skin is warm and dry. Color is normal.  HEENT is unremarkable except for missing teeth and jaw necrosis. Normocephalic/atraumatic. PERRL. Sclera are nonicteric. Neck is supple. No masses. No JVD. Lungs are clear. Cardiac exam shows a regular rate and rhythm. Abdomen is obese but soft. Extremities are without edema. Gait and ROM are intact. No gross neurologic deficits noted.   LABORATORY DATA:  Lab Results    Component Value Date   WBC 6.1 08/11/2011   HGB 9.1* 08/11/2011  HCT 28.7* 08/11/2011   PLT 246 08/11/2011   GLUCOSE 101* 08/11/2011   CHOL 160 05/04/2011   TRIG 151* 05/04/2011   HDL 27* 05/04/2011   LDLCALC 103* 05/04/2011   ALT 11 08/11/2011   AST 12 08/11/2011   NA 140 08/11/2011   K 4.5 08/11/2011   CL 107 08/11/2011   CREATININE 1.00 08/11/2011   BUN 24* 08/11/2011   CO2 23 08/11/2011   TSH 21.108* 05/04/2011   INR 1.08 07/19/2009   HGBA1C 9.9* 02/08/2011   MICROALBUR 0.83 02/08/2011     Assessment / Plan:

## 2011-09-12 NOTE — Patient Instructions (Addendum)
We are going to check labs today including an anemia panel  Get back on your Coreg. I have refilled this today  Stop the Omeprazole  Start Protonix 40 mg daily for the reflux  Stay on your other medicines  We are going to get an echo of your heart  I want to see you towards the end of next week with [name]  Call the [facility] office at [phone] if you have any questions, problems or concerns.

## 2011-09-12 NOTE — Assessment & Plan Note (Signed)
Coreg is refilled today.

## 2011-09-13 LAB — BASIC METABOLIC PANEL
BUN: 27 mg/dL — ABNORMAL HIGH (ref 6–23)
CO2: 22 mEq/L (ref 19–32)
Calcium: 9.2 mg/dL (ref 8.4–10.5)
Chloride: 105 mEq/L (ref 96–112)
Creatinine, Ser: 1.4 mg/dL — ABNORMAL HIGH (ref 0.4–1.2)
GFR: 38.98 mL/min — ABNORMAL LOW (ref >60.00)
Glucose, Bld: 253 mg/dL — ABNORMAL HIGH (ref 70–99)
Potassium: 5 mEq/L (ref 3.5–5.1)
Sodium: 137 mEq/L (ref 135–145)

## 2011-09-13 LAB — TSH: TSH: 0.06 u[IU]/mL — ABNORMAL LOW (ref 0.35–5.50)

## 2011-09-13 LAB — FOLATE: Folate: 9.5 ng/mL (ref >5.9)

## 2011-09-13 LAB — FERRITIN: Ferritin: 115.4 ng/mL (ref 10.0–291.0)

## 2011-09-13 LAB — IBC PANEL: Saturation Ratios: 13.2 % — ABNORMAL LOW (ref 20.0–50.0)

## 2011-09-13 LAB — VITAMIN B12: Vitamin B-12: 515 pg/mL (ref 211–911)

## 2011-09-14 ENCOUNTER — Telehealth: Payer: Self-pay | Admitting: *Deleted

## 2011-09-14 NOTE — Telephone Encounter (Signed)
Patient notified of lab results and anemia.  She is not sure why she never went back to GI but doesn't seem to be concerned about getting a colonoscopy.  Told her to please follow up with GI and make sure she is taking an iron supplement once daily.  Pt stated that she understood and would look into a follow up with GI>  Vista Mink, CMA

## 2011-09-14 NOTE — Telephone Encounter (Signed)
Message copied by Awilda Bill on Thu Sep 14, 2011  4:38 PM ------      Message from: Rosalio Macadamia      Created: Thu Sep 14, 2011  7:31 AM       Please report. She is iron deficient. Will need to go and see GI. She has seen Dr. Loreta Ave in the past. She has never had colonoscopy. Please send copy to Dr. Darnelle Catalan as well. If she is not taking an iron supplement, I would advise at least one a day. She can get this over the counter.

## 2011-09-18 ENCOUNTER — Other Ambulatory Visit: Payer: Self-pay | Admitting: Emergency Medicine

## 2011-09-18 ENCOUNTER — Other Ambulatory Visit: Payer: Self-pay

## 2011-09-18 ENCOUNTER — Ambulatory Visit (HOSPITAL_COMMUNITY): Payer: Medicare Other | Attending: Cardiology | Admitting: Radiology

## 2011-09-18 DIAGNOSIS — G4733 Obstructive sleep apnea (adult) (pediatric): Secondary | ICD-10-CM | POA: Insufficient documentation

## 2011-09-18 DIAGNOSIS — K219 Gastro-esophageal reflux disease without esophagitis: Secondary | ICD-10-CM

## 2011-09-18 DIAGNOSIS — E669 Obesity, unspecified: Secondary | ICD-10-CM | POA: Insufficient documentation

## 2011-09-18 DIAGNOSIS — R0602 Shortness of breath: Secondary | ICD-10-CM | POA: Insufficient documentation

## 2011-09-18 DIAGNOSIS — I079 Rheumatic tricuspid valve disease, unspecified: Secondary | ICD-10-CM | POA: Insufficient documentation

## 2011-09-18 DIAGNOSIS — I2510 Atherosclerotic heart disease of native coronary artery without angina pectoris: Secondary | ICD-10-CM | POA: Insufficient documentation

## 2011-09-18 DIAGNOSIS — C50919 Malignant neoplasm of unspecified site of unspecified female breast: Secondary | ICD-10-CM

## 2011-09-18 DIAGNOSIS — E039 Hypothyroidism, unspecified: Secondary | ICD-10-CM

## 2011-09-18 DIAGNOSIS — E785 Hyperlipidemia, unspecified: Secondary | ICD-10-CM | POA: Insufficient documentation

## 2011-09-18 DIAGNOSIS — D649 Anemia, unspecified: Secondary | ICD-10-CM

## 2011-09-18 DIAGNOSIS — E119 Type 2 diabetes mellitus without complications: Secondary | ICD-10-CM | POA: Insufficient documentation

## 2011-09-18 DIAGNOSIS — I10 Essential (primary) hypertension: Secondary | ICD-10-CM | POA: Insufficient documentation

## 2011-09-18 DIAGNOSIS — I059 Rheumatic mitral valve disease, unspecified: Secondary | ICD-10-CM | POA: Insufficient documentation

## 2011-09-18 MED ORDER — OXYCODONE-ACETAMINOPHEN 5-325 MG PO TABS: 1.0000 | ORAL_TABLET | Freq: Four times a day (QID) | ORAL | Status: DC | PRN

## 2011-09-18 NOTE — Progress Notes (Signed)
Echocardiogram performed.  

## 2011-09-19 ENCOUNTER — Telehealth: Payer: Self-pay | Admitting: *Deleted

## 2011-09-19 NOTE — Telephone Encounter (Signed)
Message copied by Awilda Bill on Tue Sep 19, 2011 11:55 AM ------      Message from: Rosalio Macadamia      Created: Mon Sep 18, 2011 12:38 PM       Ok to report. Echo is satisfactory. Normal pumping function. Mild diastolic dysfunction. Will discuss further with her at her follow up visit.

## 2011-09-19 NOTE — Telephone Encounter (Signed)
Message copied by Awilda Bill on Tue Sep 19, 2011 12:01 PM ------      Message from: Rosalio Macadamia      Created: Mon Sep 18, 2011 12:38 PM       Ok to report. Echo is satisfactory. Normal pumping function. Mild diastolic dysfunction. Will discuss further with her at her follow up visit.

## 2011-09-19 NOTE — Telephone Encounter (Signed)
Patient notified of echo results.  She will discuss more in depth on her follow up appt.

## 2011-09-21 ENCOUNTER — Other Ambulatory Visit: Payer: Medicare Other | Admitting: Lab

## 2011-09-22 ENCOUNTER — Encounter: Payer: Self-pay | Admitting: Nurse Practitioner

## 2011-09-22 ENCOUNTER — Ambulatory Visit (INDEPENDENT_AMBULATORY_CARE_PROVIDER_SITE_OTHER): Payer: Medicare Other | Admitting: Nurse Practitioner

## 2011-09-22 ENCOUNTER — Encounter: Payer: Self-pay | Admitting: Internal Medicine

## 2011-09-22 VITALS — BP 148/50 | HR 88 | Ht 63.0 in | Wt 254.0 lb

## 2011-09-22 DIAGNOSIS — D649 Anemia, unspecified: Secondary | ICD-10-CM

## 2011-09-22 DIAGNOSIS — R1013 Epigastric pain: Secondary | ICD-10-CM

## 2011-09-22 DIAGNOSIS — R0602 Shortness of breath: Secondary | ICD-10-CM

## 2011-09-22 DIAGNOSIS — R0600 Dyspnea, unspecified: Secondary | ICD-10-CM

## 2011-09-22 DIAGNOSIS — K30 Functional dyspepsia: Secondary | ICD-10-CM

## 2011-09-22 DIAGNOSIS — K3189 Other diseases of stomach and duodenum: Secondary | ICD-10-CM

## 2011-09-22 NOTE — Patient Instructions (Addendum)
Please call Dr. Rodena Medin and ask him about your thyroid medicine  We will get you an appointment with  GI for evaluation of your anemia  Stay on your current medicines  We will see you back in 6 months.  Call the [facility] office at [phone] if you have any questions, problems or concerns.

## 2011-09-22 NOTE — Progress Notes (Signed)
Brittney Cunningham Date of Birth: [birth year] Medical Record #[id number]  History of Present Illness: Brittney Cunningham is seen back today for a one week check. She is seen for Brittney Cunningham. She has had prior abnormal stress test and had cardiac cath showing severe single vessel RCA disease with left to right collaterals and normal LV function. Has known PVD and has had R CEA and left carotid stenting. 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 She has anemia as well. I saw her last week with complaints of reflux and chest pressure. Her discomfort was worse when she laid down and better when she was upright. We checked labs, resumed her Coreg, changed her to Protonix and got an echo. Her iron levels were low. She had never had colonoscopy. TSH was low as well.   She comes into today. She is here with her husband. She says she is doing better. Still gets short of breath if she over exerts herself but she says she was actually able to do a little more this past week. Her indigestion is almost gone. Her labs were reviewed with her. She would like to see [facility] for an anemia evaluation. TSH was low and she will be in touch with Brittney Cunningham to see if her dose needs to be adjusted. Overall, she does think she is improved. No actual chest pain with exertion reported.   Current Outpatient Prescriptions on File Prior to Visit  Medication Sig Dispense Refill  . aspirin 81 MG chewable tablet Chew 81 mg by mouth daily.      Marland Kitchen atorvastatin (LIPITOR) 40 MG tablet Take 1 tablet (40 mg total) by mouth daily.  90 tablet  1  . calcium carbonate (OS-CAL) 600 MG TABS Take 600 mg by mouth 2 (two) times daily with a meal.        . carvedilol (COREG) 12.5 MG tablet Take 1 tablet (12.5 mg total) by mouth 2 (two) times daily with a meal.  60 tablet  11  . chlorhexidine (PERIDEX) 0.12 % solution Rinse for 90  seconds  with 15 mLs three times daily. Spit out excess. Do NOT swallow.      . citalopram  (CELEXA) 20 MG tablet Take 1 tablet (20 mg total) by mouth daily.  30 tablet  0  . clopidogrel (PLAVIX) 75 MG tablet Take 75 mg by mouth daily.      . cyanocobalamin (,VITAMIN B-12,) 1000 MCG/ML injection Inject 1,000 mcg into the muscle every 30 (thirty) days. Last dose 03/09/2011      . gabapentin (NEURONTIN) 300 MG capsule Take 1 capsule (300 mg total) by mouth at bedtime as needed.  30 capsule  5  . hydrochlorothiazide (MICROZIDE) 12.5 MG capsule Take 1 capsule (12.5 mg total) by mouth daily.  30 capsule  6  . ibuprofen (ADVIL) 200 MG tablet Take 200 mg by mouth 3 (three) times daily.        . insulin glargine (LANTUS) 100 UNIT/ML injection Inject 60 Units into the skin at bedtime.       Marland Kitchen letrozole (FEMARA) 2.5 MG tablet Take 1 tablet (2.5 mg total) by mouth daily.  30 tablet  11  . levothyroxine (SYNTHROID) 200 MCG tablet Take 1 tablet (200 mcg total) by mouth daily.  30 tablet  11  . levothyroxine (SYNTHROID) 75 MCG tablet Take 1 tablet (75 mcg total) by mouth daily. Please take with tablet  30 tablet  11  .  LORazepam (ATIVAN) 0.5 MG tablet Take 1 tab by mouth twice a day as needed for anxiety  60 tablet  0  . losartan (COZAAR) 50 MG tablet Take 1 tablet (50 mg total) by mouth daily.  30 tablet  6  . metFORMIN (GLUCOPHAGE) 500 MG tablet Take 1 tablet (500 mg total) by mouth every morning.  30 tablet  5  . oxyCODONE-acetaminophen (PERCOCET/ROXICET) 5-325 MG per tablet Take 1 tablet by mouth 4 (four) times daily as needed. For pain  120 tablet  0  . pantoprazole (PROTONIX) 40 MG tablet Take 1 tablet (40 mg total) by mouth daily.  30 tablet  11  . bisacodyl (DULCOLAX) 5 MG EC tablet Take 5 mg by mouth daily as needed. For constipation      . DISCONTD: simvastatin (ZOCOR) 40 MG tablet Take 40 mg by mouth every evening.        Allergies  Allergen Reactions  . Chlorhexidine Hives, Itching and Rash    This was most likely a CONTACT DERMATITIS versus true systemic allergic reaction  .  Adhesive (Tape) Hives  . Codeine Swelling    Past Medical History  Diagnosis Date  . Anemia   . Depression   . Diabetes mellitus type II   . GERD (gastroesophageal reflux disease)   . Hyperlipidemia   . Hypothyroidism   . Osteoarthritis   . Carotid artery stenosis     bilateral  . CAD (coronary artery disease)   . OSA (obstructive sleep apnea)   . Breast cancer     right breast cancer-invasive  ductal carcinoma (StageIV)  . Hypertension   . Anxiety   . Carotid artery occlusion     Past Surgical History  Procedure Date  . Incisional breast biopsy     remoted left breast biopsy  . Cesarean section   . Other surgical history     GYN surgery  . Knee surgery     right knee surgery  . Carotid endarterectomy     right carotid  . Port-a-cath removal   . Modified mastectomy     Right breast  . Breast surgery   . Mastectomy     History  Smoking status  . Former Smoker -- 1.0 packs/day for 20 years  . Types: Cigarettes  . Quit date: 01/31/1988  Smokeless tobacco  . Never Used    History  Alcohol Use No    Family History  Problem Relation Age of Onset  . Arthritis    . Coronary artery disease      first degree relative  . Stroke      first degree relative  . Diabetes Mother   . Heart disease Mother   . Hypertension Mother     Review of Systems: The review of systems is per the HPI.  All other systems were reviewed and are negative.  Physical Exam: BP 148/50  Pulse 88  Ht 5\' 3"  (1.6 m)  Wt 254 lb (115.214 kg)  BMI 44.99 kg/m2 Patient is very pleasant and in no acute distress. She is obese. Skin is warm and dry. Color is normal.  HEENT is unremarkable. Normocephalic/atraumatic. PERRL. Sclera are nonicteric. Neck is supple. No masses. No JVD. Lungs are clear. Cardiac exam shows a regular rate and rhythm. Abdomen is soft. Extremities are without edema. Gait and ROM are intact. No gross neurologic deficits noted.   LABORATORY DATA:  Echo Study  Conclusions  - Left ventricle: The cavity size was normal. There was mild  focal basal hypertrophy of the septum. Systolic function was normal. The estimated ejection fraction was in the range of 55% to 60%. Wall motion was normal; there were no regional wall motion abnormalities. Doppler parameters are consistent with abnormal left ventricular relaxation (grade 1 diastolic dysfunction). - Mitral valve: Calcified annulus. Mild regurgitation. - Left atrium: The atrium was mildly dilated. - Pulmonary arteries: Systolic pressure was mildly increased. PA peak pressure: 40mm Hg (S).  Lab Results  Component Value Date   WBC 8.7 09/12/2011   HGB 9.5* 09/12/2011   HCT 29.9* 09/12/2011   PLT 322.0 09/12/2011   GLUCOSE 253* 09/12/2011   CHOL 160 05/04/2011   TRIG 151* 05/04/2011   HDL 27* 05/04/2011   LDLCALC 103* 05/04/2011   ALT 11 08/11/2011   AST 12 08/11/2011   NA 137 09/12/2011   K 5.0 09/12/2011   CL 105 09/12/2011   CREATININE 1.4* 09/12/2011   BUN 27* 09/12/2011   CO2 22 09/12/2011   TSH 0.06* 09/12/2011   INR 1.08 07/19/2009   HGBA1C 9.9* 02/08/2011   MICROALBUR 0.83 02/08/2011      Assessment / Plan: 1. Indigestion - she has improved with changing to Protonix. We will continue with this therapy.  2. Anemia - looks iron deficient. She sees Dr. Darnelle Catalan next week. She is wanting to go to [facility]. She has never had a colonoscopy. I think she would feel a little better with a higher blood count. Will put in for referral to [facility].  3. CAD - with known single vessel CAD. No exertional symptoms. Echo looks basically ok.  I have discussed with Brittney Cunningham. We do not feel that repeat stress testing is indicated in light of the known abnormality and cath data from 2011. We will plan on seeing her back in 6 months.   4. Thyroid disease - TSH is noted. She is going to call Brittney Cunningham to discuss if she needs a dose change.   Patient is agreeable to this plan and will call if any problems develop in  the interim.

## 2011-09-26 ENCOUNTER — Telehealth: Payer: Self-pay | Admitting: *Deleted

## 2011-09-26 DIAGNOSIS — E039 Hypothyroidism, unspecified: Secondary | ICD-10-CM

## 2011-09-26 MED ORDER — LEVOTHYROXINE SODIUM 50 MCG PO TABS: 50.0000 ug | ORAL_TABLET | Freq: Every day | ORAL | Status: DC

## 2011-09-26 NOTE — Telephone Encounter (Signed)
Pt had labs done at Dr. Elvis Coil office & was seen by Norma Fredrickson PA and informed that her TSH was low [result 0.06]; currently taking 200+75 mcg Levothyroxine daily,  Per [facility], lower dosage to 200+50 mcg daily with f/u labs [TSH & T4 free] in 8-10 wks after medication change has started. Patient informed; understood & agreed/SLS

## 2011-09-28 ENCOUNTER — Telehealth: Payer: Self-pay | Admitting: *Deleted

## 2011-09-28 ENCOUNTER — Other Ambulatory Visit (HOSPITAL_BASED_OUTPATIENT_CLINIC_OR_DEPARTMENT_OTHER): Payer: Medicare Other | Admitting: Lab

## 2011-09-28 ENCOUNTER — Ambulatory Visit (HOSPITAL_BASED_OUTPATIENT_CLINIC_OR_DEPARTMENT_OTHER): Payer: Medicare Other | Admitting: Oncology

## 2011-09-28 VITALS — BP 122/62 | HR 78 | Temp 98.5°F | Resp 20 | Ht 63.0 in | Wt 255.4 lb

## 2011-09-28 DIAGNOSIS — Z170 Estrogen receptor positive status [ER+]: Secondary | ICD-10-CM

## 2011-09-28 DIAGNOSIS — C50919 Malignant neoplasm of unspecified site of unspecified female breast: Secondary | ICD-10-CM

## 2011-09-28 DIAGNOSIS — C7951 Secondary malignant neoplasm of bone: Secondary | ICD-10-CM

## 2011-09-28 DIAGNOSIS — D649 Anemia, unspecified: Secondary | ICD-10-CM

## 2011-09-28 LAB — CBC WITH DIFFERENTIAL/PLATELET
Basophils Absolute: 0 10*3/uL (ref 0.0–0.1)
Eosinophils Absolute: 0.2 10*3/uL (ref 0.0–0.5)
HCT: 28.6 % — ABNORMAL LOW (ref 34.8–46.6)
HGB: 9 g/dL — ABNORMAL LOW (ref 11.6–15.9)
MONO#: 0.6 10*3/uL (ref 0.1–0.9)
NEUT#: 4.1 10*3/uL (ref 1.5–6.5)
NEUT%: 60.9 % (ref 38.4–76.8)
RDW: 15.5 % — ABNORMAL HIGH (ref 11.2–14.5)
lymph#: 1.8 10*3/uL (ref 0.9–3.3)

## 2011-09-28 LAB — COMPREHENSIVE METABOLIC PANEL (CC13)
CO2: 21 mEq/L — ABNORMAL LOW (ref 22–29)
Creatinine: 1.3 mg/dL — ABNORMAL HIGH (ref 0.6–1.1)
Glucose: 294 mg/dl — ABNORMAL HIGH (ref 70–99)
Total Bilirubin: 0.3 mg/dL (ref 0.20–1.20)
Total Protein: 6.5 g/dL (ref 6.4–8.3)

## 2011-09-28 LAB — CANCER ANTIGEN 27.29: CA 27.29: 88 U/mL — ABNORMAL HIGH (ref 0–39)

## 2011-09-28 MED ORDER — FLUCONAZOLE 100 MG PO TABS: 100.0000 mg | ORAL_TABLET | Freq: Every day | ORAL | Status: AC

## 2011-09-28 MED ORDER — AMOXICILLIN 500 MG PO CAPS: 500.0000 mg | ORAL_CAPSULE | Freq: Two times a day (BID) | ORAL | Status: AC

## 2011-09-28 NOTE — Progress Notes (Signed)
ID: Brittney Cunningham   DOB: 1940-04-18  MR#: [id number]  YNW#:[id number]  HISTORY OF PRESENT ILLNESS: Brittney Cunningham was diagnosed with right breast carcinoma in May of 2011, a biopsy showing a grade 2 invasive ductal carcinoma, T2 NXM1, stage IV at presentation. There was bone only involvement. Tumor was strongly ER and PR positive, HER-2/neu negative, with an MIP-1 of 26%.  She was treated neoadjuvantly with letrozole and zoledronic acid beginning in June of 2011. She is status post right modified radical mastectomy in February 2012 for a ypT2 ypN2, grade 1 invasive ductal carcinoma. Margins were negative.  She received postmastectomy radiation therapy, completed in August of 2012.  Patient has continued on letrozole. She received zoledronic acid until she was switched to [facility] due to concerns regarding increasing serum creatinine. Now receiving Xgeva every 8 weeks.  Patient also has a history of pernicious anemia and receives vitamin B 12 injections here monthly.   INTERVAL HISTORY: Brittney Cunningham returns today for followup of her metastatic breast carcinoma. The interval history is generally stable, although she has multiple comorbid conditions, as previously noted. Recently she was evaluated by cardiology for increasing shortness of breath, which may be due to her anemia. 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 In general though, she feels "like usual", and is planning a trip to [place] with the family over the holidays.  REVIEW OF SYSTEMS: She has seen both Dr. Kristin Bruins here and Dr. Mauri Pole at [facility] regarding the jaw problem. She has been treated with penicillin and amoxicillin. Currently she tells me she is having more pain in the right cheekbone area, causing her some headaches in the afternoon. She also has some headaches at times when she lies down at night. Her gums "aren't trouble". She has developed worse heartburn problems, and has resumed Protonix, with good results.  She is taking letrozole with no obvious side effects and particularly no problems with vaginal dryness or hot flashes, though she has multiple joint pains which may or may not be aggravated by that drug. She feels anxious but not depressed. Overall a detailed review of systems was stable.  PAST MEDICAL HISTORY: Past Medical History  Diagnosis Date  . Anemia   . Depression   . Diabetes mellitus type II   . GERD (gastroesophageal reflux disease)   . Hyperlipidemia   . Hypothyroidism   . Osteoarthritis   . Carotid artery stenosis     bilateral  . CAD (coronary artery disease)   . OSA (obstructive sleep apnea)   . Breast cancer     right breast cancer-invasive  ductal carcinoma (StageIV)  . Hypertension   . Anxiety   . Carotid artery occlusion     PAST SURGICAL HISTORY: Past Surgical History  Procedure Date  . Incisional breast biopsy     remoted left breast biopsy  . Cesarean section   . Other surgical history     GYN surgery  . Knee surgery     right knee surgery  . Carotid endarterectomy     right carotid  . Port-a-cath removal   . Modified mastectomy     Right breast  . Breast surgery   . Mastectomy     FAMILY HISTORY Family History  Problem Relation Age of Onset  . Arthritis    . Coronary artery disease      first degree relative  . Stroke      first degree relative  . Diabetes Mother   .  Heart disease Mother   . Hypertension Mother   The patient's father died from pancreatic cancer at the age of 64. The patient's mother died from complications of diabetes and heart disease at the age of 25. The patient has 2 sisters and 2 brothers. There is no history of breast cancer or ovarian cancer in the immediate family. One of the patient's paternal aunts out of 6 paternal aunts had breast cancer diagnosed in her 47s.    GYNECOLOGIC HISTORY: The patient is GX, P3. First pregnancy to term age 48. She went through the change of life in her late 74s. She never took  hormones.   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 Her husband of 43 years, Brittney Cunningham, is disabled secondary to a fall. He has difficulty with walking. He used to work for [employer] previously. The patient's Cunningham, Brittney Cunningham, is present today. He lives in [place], and works for [employer]. Daughter, Brittney Cunningham, 28 years old, lives in [place], and works for [employer]. Daughter, Brittney Cunningham, 90, lives in [place], [place], and works as a Statistician. The patient attends [employer].   ADVANCED DIRECTIVES:  HEALTH MAINTENANCE: History  Substance Use Topics  . Smoking status: Former Smoker -- 1.0 packs/day for 20 years    Types: Cigarettes    Quit date: 01/31/1988  . Smokeless tobacco: Never Used  . Alcohol Use: No     Colonoscopy:  PAP:  Bone density:  Lipid panel:  Allergies  Allergen Reactions  . Chlorhexidine Hives, Itching and Rash    This was most likely a CONTACT DERMATITIS versus true systemic allergic reaction  . Adhesive (Tape) Hives  . Codeine Swelling    Current Outpatient Prescriptions  Medication Sig Dispense Refill  . aspirin 81 MG chewable tablet Chew 81 mg by mouth daily.      Marland Kitchen atorvastatin (LIPITOR) 40 MG tablet Take 1 tablet (40 mg total) by mouth daily.  90 tablet  1  . bisacodyl (DULCOLAX) 5 MG EC tablet Take 5 mg by mouth daily as needed. For constipation      . calcium carbonate (OS-CAL) 600 MG TABS Take 600 mg by mouth 2 (two) times daily with a meal.        . carvedilol (COREG) 12.5 MG tablet Take 1 tablet (12.5 mg total) by mouth 2 (two) times daily with a meal.  60 tablet  11  . chlorhexidine (PERIDEX) 0.12 % solution Rinse for 90  seconds  with 15 mLs three times daily. Spit out excess. Do NOT swallow.      . citalopram (CELEXA) 20 MG tablet Take 1 tablet (20 mg total) by mouth daily.  30 tablet  0  . clopidogrel (PLAVIX) 75 MG tablet Take 75 mg by mouth daily.      . cyanocobalamin (,VITAMIN  B-12,) 1000 MCG/ML injection Inject 1,000 mcg into the muscle every 30 (thirty) days. Last dose 03/09/2011      . gabapentin (NEURONTIN) 300 MG capsule Take 1 capsule (300 mg total) by mouth at bedtime as needed.  30 capsule  5  . hydrochlorothiazide (MICROZIDE) 12.5 MG capsule Take 1 capsule (12.5 mg total) by mouth daily.  30 capsule  6  . ibuprofen (ADVIL) 200 MG tablet Take 200 mg by mouth 3 (three) times daily.        . insulin glargine (LANTUS) 100 UNIT/ML injection Inject 60 Units into the skin at bedtime.       Marland Kitchen  letrozole (FEMARA) 2.5 MG tablet Take 1 tablet (2.5 mg total) by mouth daily.  30 tablet  11  . levothyroxine (SYNTHROID) 200 MCG tablet Take 1 tablet (200 mcg total) by mouth daily.  30 tablet  11  . levothyroxine (SYNTHROID, LEVOTHROID) 50 MCG tablet Take 1 tablet (50 mcg total) by mouth daily. Dosage change, please take with 200 mcg tablet.  30 tablet  3  . LORazepam (ATIVAN) 0.5 MG tablet Take 1 tab by mouth twice a day as needed for anxiety  60 tablet  0  . losartan (COZAAR) 50 MG tablet Take 1 tablet (50 mg total) by mouth daily.  30 tablet  6  . metFORMIN (GLUCOPHAGE) 500 MG tablet Take 1 tablet (500 mg total) by mouth every morning.  30 tablet  5  . oxyCODONE-acetaminophen (PERCOCET/ROXICET) 5-325 MG per tablet Take 1 tablet by mouth 4 (four) times daily as needed. For pain  120 tablet  0  . pantoprazole (PROTONIX) 40 MG tablet Take 1 tablet (40 mg total) by mouth daily.  30 tablet  11  . DISCONTD: simvastatin (ZOCOR) 40 MG tablet Take 40 mg by mouth every evening.        OBJECTIVE: Elderly white female in no acute distress. Filed Vitals:   09/28/11 1038  BP: 122/62  Pulse: 78  Temp: 98.5 F (36.9 C)  Resp: 20     Body mass index is 45.24 kg/(m^2).    ECOG FS: 2  Filed Weights   09/28/11 1038  Weight: 255 lb 6.4 oz (115.849 kg)   Sclerae unicteric Oropharynx shows the area of osteonecrosis in the right maxilla, with visible sequestrant. There is minimal erythema.  There is no obvious purulence. No cervical or supraclavicular adenopathy Lungs no rales or rhonchi Heart regular rate and rhythm Abd benign MSK no focal spinal tenderness Neuro: nonfocal Breasts: Status post right mastectomy, no evidence of local recurrence. The left breast is unremarkable.    LAB RESULTS: Lab Results  Component Value Date   WBC 6.8 09/28/2011   NEUTROABS 4.1 09/28/2011   HGB 9.0* 09/28/2011   HCT 28.6* 09/28/2011   MCV 84.6 09/28/2011   PLT 281 09/28/2011      Chemistry      Component Value Date/Time   NA 137 09/12/2011 1516   K 5.0 09/12/2011 1516   CL 105 09/12/2011 1516   CO2 22 09/12/2011 1516   BUN 27* 09/12/2011 1516   CREATININE 1.4* 09/12/2011 1516      Component Value Date/Time   CALCIUM 9.2 09/12/2011 1516   ALKPHOS 90 08/11/2011 0853   AST 12 08/11/2011 0853   ALT 11 08/11/2011 0853   BILITOT 0.3 08/11/2011 0853       Lab Results  Component Value Date   LABCA2 95* 08/11/2011    STUDIES:  Next mammogram due Oct 2013. Most recent bone scan 11/16/2010 was stable   ASSESSMENT: A 71 year old [place] woman   (1) status post right breast biopsy May of 2011 for a grade 2 invasive ductal carcinoma, T2 NX M1, Stage IV, with bone-only involvement, strongly estrogen and progesterone receptor-positive, HER-2-negative with an MIB-1 of 26%.   (2) Neoadjuvantly she received letrozole and zoledronic acid beginning in June of 2011 and underwent right modified radical mastectomy February of 2012 for a ypT2 ypN2, grade 1 invasive ductal carcinoma with negative margins.   (3) She completed radiation therapy in August of 2012.   (4) She has continued on letrozole but was switched from zoledronic acid  to denosumab Rivka Barbara) because of concerns regarding her serum creatinine. Rivka Barbara was being given every 8 weeks.  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  (6) symptomatic anemia, with creatinine clearance < 60 cc/min  (7) on  subcutaneous B-12 supplementation  PLAN: From a breast cancer point of view Leva is doing remarkably well. I don't think the joint issues she is experiencing are really related to her letrozole. The plan is to continue that drug until there is clear evidence of disease progression. In the meantime, I am putting her back on amoxicillin for 10 days to see if that helps the increasing discomfort she is experiencing in her right maxilla. I also wrote her for Diflucan in case she develops fungal superinfection. We discussed how to take those medications. I agree that if her hemoglobin were between 10 and 10.5 she would be less symptomatic. We will start aranesp with her next port flush which will be September 13. Otherwise we will continue to see her on a every 3 month basis. She knows to call for any problems that may develop before the next visit.   MAGRINAT,GUSTAV C    09/28/2011

## 2011-09-30 ENCOUNTER — Telehealth: Payer: Self-pay | Admitting: *Deleted

## 2011-09-30 NOTE — Telephone Encounter (Signed)
Informed patient of the new date and times on 10-13-2011 patient confirmed over the phone advised patient to please come by scheduling and pick up new calendar

## 2011-10-03 ENCOUNTER — Other Ambulatory Visit: Payer: Self-pay | Admitting: Oncology

## 2011-10-05 ENCOUNTER — Other Ambulatory Visit: Payer: Medicare Other | Admitting: Lab

## 2011-10-06 ENCOUNTER — Other Ambulatory Visit: Payer: Medicare Other | Admitting: Lab

## 2011-10-12 ENCOUNTER — Other Ambulatory Visit: Payer: Self-pay | Admitting: Oncology

## 2011-10-13 ENCOUNTER — Other Ambulatory Visit: Payer: Medicare Other | Admitting: Lab

## 2011-10-13 ENCOUNTER — Other Ambulatory Visit: Payer: Self-pay | Admitting: Lab

## 2011-10-18 ENCOUNTER — Encounter: Payer: Self-pay | Admitting: Oncology

## 2011-10-18 ENCOUNTER — Other Ambulatory Visit: Payer: Self-pay | Admitting: *Deleted

## 2011-10-18 ENCOUNTER — Ambulatory Visit: Payer: Self-pay | Admitting: Physician Assistant

## 2011-10-18 ENCOUNTER — Other Ambulatory Visit: Payer: Self-pay | Admitting: Lab

## 2011-10-18 NOTE — Telephone Encounter (Signed)
No entry required

## 2011-10-18 NOTE — Progress Notes (Signed)
FTKA today.  Desk nurse was made aware and will follow up on patient's schedule.  We will likely begin her Aranesp injections at her next scheduled appointment on Oct 4 when she is also due for her next B12 injection.  Zollie Scale, PA-C 09/18/213

## 2011-10-18 NOTE — Progress Notes (Signed)
Aranesp [id number] is ok for non-chemo related anemia in cancer. Per Mrs. Pearson, RN.  Use Dx code 285.22.

## 2011-10-19 ENCOUNTER — Other Ambulatory Visit: Payer: Medicare Other | Admitting: Lab

## 2011-10-19 ENCOUNTER — Other Ambulatory Visit: Payer: Self-pay | Admitting: Oncology

## 2011-10-20 ENCOUNTER — Ambulatory Visit: Payer: Self-pay | Admitting: Internal Medicine

## 2011-10-24 ENCOUNTER — Other Ambulatory Visit: Payer: Self-pay | Admitting: *Deleted

## 2011-10-24 ENCOUNTER — Other Ambulatory Visit: Payer: Self-pay | Admitting: Physician Assistant

## 2011-10-24 DIAGNOSIS — C50919 Malignant neoplasm of unspecified site of unspecified female breast: Secondary | ICD-10-CM

## 2011-10-24 MED ORDER — CITALOPRAM HYDROBROMIDE 20 MG PO TABS: 20.0000 mg | ORAL_TABLET | Freq: Every day | ORAL | Status: DC

## 2011-10-25 ENCOUNTER — Other Ambulatory Visit: Payer: Self-pay | Admitting: *Deleted

## 2011-10-25 ENCOUNTER — Telehealth: Payer: Self-pay | Admitting: *Deleted

## 2011-10-25 MED ORDER — CLOPIDOGREL BISULFATE 75 MG PO TABS: 75.0000 mg | ORAL_TABLET | Freq: Every day | ORAL | Status: DC

## 2011-10-25 NOTE — Telephone Encounter (Signed)
GM in late Nov, approx 11/21  12-21-2011 starting at 9:30am

## 2011-10-26 ENCOUNTER — Encounter: Payer: Self-pay | Admitting: Internal Medicine

## 2011-10-26 ENCOUNTER — Ambulatory Visit (HOSPITAL_BASED_OUTPATIENT_CLINIC_OR_DEPARTMENT_OTHER)
Admission: RE | Admit: 2011-10-26 | Discharge: 2011-10-26 | Disposition: A | Discharge status: Home / Self Care | Payer: Medicare Other | Source: Ambulatory Visit | Attending: Internal Medicine | Admitting: Internal Medicine

## 2011-10-26 ENCOUNTER — Ambulatory Visit (INDEPENDENT_AMBULATORY_CARE_PROVIDER_SITE_OTHER): Payer: Medicare Other | Admitting: Internal Medicine

## 2011-10-26 VITALS — BP 128/60 | HR 65 | Temp 98.5°F | Resp 18 | Wt 250.0 lb

## 2011-10-26 DIAGNOSIS — R109 Unspecified abdominal pain: Secondary | ICD-10-CM

## 2011-10-26 DIAGNOSIS — E039 Hypothyroidism, unspecified: Secondary | ICD-10-CM

## 2011-10-26 DIAGNOSIS — R197 Diarrhea, unspecified: Secondary | ICD-10-CM | POA: Insufficient documentation

## 2011-10-26 DIAGNOSIS — E119 Type 2 diabetes mellitus without complications: Secondary | ICD-10-CM

## 2011-10-26 MED ORDER — DEXLANSOPRAZOLE 60 MG PO CPDR: 60.0000 mg | DELAYED_RELEASE_CAPSULE | Freq: Every day | ORAL | Status: DC

## 2011-10-26 NOTE — Patient Instructions (Signed)
Please schedule nonfasting labs in one month Tsh/free t4-hypothyroidism, a1c-250.00

## 2011-10-27 ENCOUNTER — Telehealth: Payer: Self-pay | Admitting: Internal Medicine

## 2011-10-27 NOTE — Telephone Encounter (Signed)
Patient is requesting results from x-ray that was taken yesterday

## 2011-10-27 NOTE — Telephone Encounter (Signed)
Patient informed, understood & agreed/SLS  

## 2011-10-29 DIAGNOSIS — R109 Unspecified abdominal pain: Secondary | ICD-10-CM | POA: Insufficient documentation

## 2011-10-29 NOTE — Assessment & Plan Note (Signed)
Obtain A1c prior to next visit

## 2011-10-29 NOTE — Assessment & Plan Note (Signed)
Obtain TSH prior to next visit

## 2011-10-29 NOTE — Progress Notes (Signed)
  Subjective:    Patient ID: Brittney Cunningham, female    DOB: 03/31/1940, 71 y.o.   MRN: [id number]  HPI patient presents to clinic for evaluation of abdominal complaints. Notes she and her husband initially had what appears to been a gastroenteritis. Husband symptoms resolve. Her symptoms have persisted but improved. Now has nausea without emesis and loose watery stools without blood both of which are improving. Currently denies diarrhea. Has mild upper abdominal pain. Begin Protonix which helps. Has GI appointment scheduled and pending. Reviewed hypothyroidism under suboptimal control but is status post dose adjustment of medication.  Past Medical History  Diagnosis Date  . Anemia   . Depression   . Diabetes mellitus type II   . GERD (gastroesophageal reflux disease)   . Hyperlipidemia   . Hypothyroidism   . Osteoarthritis   . Carotid artery stenosis     bilateral  . CAD (coronary artery disease)   . OSA (obstructive sleep apnea)   . Breast cancer     right breast cancer-invasive  ductal carcinoma (StageIV)  . Hypertension   . Anxiety   . Carotid artery occlusion    Past Surgical History  Procedure Date  . Incisional breast biopsy     remoted left breast biopsy  . Cesarean section   . Other surgical history     GYN surgery  . Knee surgery     right knee surgery  . Carotid endarterectomy     right carotid  . Port-a-cath removal   . Modified mastectomy     Right breast  . Breast surgery   . Mastectomy     reports that she quit smoking about 23 years ago. Her smoking use included Cigarettes. She has a 20 pack-year smoking history. She has never used smokeless tobacco. She reports that she does not drink alcohol or use illicit drugs. family history includes Arthritis in an unspecified family member; Coronary artery disease in an unspecified family member; Diabetes in her mother; Heart disease in her mother; Hypertension in her mother; and Stroke in an unspecified family  member. Allergies  Allergen Reactions  . Chlorhexidine Hives, Itching and Rash    This was most likely a CONTACT DERMATITIS versus true systemic allergic reaction  . Adhesive (Tape) Hives  . Codeine Swelling     Review of Systems see history of present illness     Objective:   Physical Exam  Constitutional: She appears well-developed and well-nourished. No distress.  HENT:  Head: Normocephalic and atraumatic.  Left Ear: External ear normal.  Eyes: Conjunctivae normal are normal. No scleral icterus.  Neck: Neck supple.  Abdominal: Soft. Bowel sounds are normal. She exhibits no distension.       Mild discomfort to palpation in the epigastric area without rebound guarding rigidity. Positive bowel sounds noted. No masses appreciated. No organomegaly  Neurological: She is alert.  Skin: She is not diaphoretic.  Psychiatric: She has a normal mood and affect.          Assessment & Plan:

## 2011-10-29 NOTE — Assessment & Plan Note (Signed)
Obtain plain abdominal radiograph. Change Protonix to dexilant-samples given. Keep GI appointment

## 2011-10-31 ENCOUNTER — Other Ambulatory Visit: Payer: Self-pay | Admitting: *Deleted

## 2011-10-31 MED ORDER — OXYCODONE-ACETAMINOPHEN 5-325 MG PO TABS: 1.0000 | ORAL_TABLET | Freq: Four times a day (QID) | ORAL | Status: DC | PRN

## 2011-11-01 ENCOUNTER — Other Ambulatory Visit: Payer: Self-pay | Admitting: Medical Oncology

## 2011-11-01 MED ORDER — OXYCODONE-ACETAMINOPHEN 5-325 MG PO TABS: 1.0000 | ORAL_TABLET | Freq: Four times a day (QID) | ORAL | Status: DC | PRN

## 2011-11-01 NOTE — Telephone Encounter (Signed)
Per computer med list, appears that prescription for Oxycodone written 10/31/2011.  [facility] however unable to locate prescription.  Looked in computer, appears prescription was not printed.  Informed Amy Allyson Sabal of situation, new prescription written and given to patient.

## 2011-11-03 ENCOUNTER — Ambulatory Visit (HOSPITAL_BASED_OUTPATIENT_CLINIC_OR_DEPARTMENT_OTHER): Payer: Medicare Other

## 2011-11-03 ENCOUNTER — Other Ambulatory Visit (HOSPITAL_BASED_OUTPATIENT_CLINIC_OR_DEPARTMENT_OTHER): Payer: Medicare Other | Admitting: Lab

## 2011-11-03 ENCOUNTER — Other Ambulatory Visit (HOSPITAL_BASED_OUTPATIENT_CLINIC_OR_DEPARTMENT_OTHER): Payer: Medicare Other | Admitting: *Deleted

## 2011-11-03 ENCOUNTER — Other Ambulatory Visit: Payer: Self-pay | Admitting: Physician Assistant

## 2011-11-03 VITALS — Temp 97.8°F

## 2011-11-03 DIAGNOSIS — D630 Anemia in neoplastic disease: Secondary | ICD-10-CM

## 2011-11-03 DIAGNOSIS — C50919 Malignant neoplasm of unspecified site of unspecified female breast: Secondary | ICD-10-CM

## 2011-11-03 DIAGNOSIS — D649 Anemia, unspecified: Secondary | ICD-10-CM

## 2011-11-03 DIAGNOSIS — D510 Vitamin B12 deficiency anemia due to intrinsic factor deficiency: Secondary | ICD-10-CM

## 2011-11-03 LAB — CBC WITH DIFFERENTIAL/PLATELET
BASO%: 0.7 % (ref 0.0–2.0)
Eosinophils Absolute: 0.3 10*3/uL (ref 0.0–0.5)
LYMPH%: 25 % (ref 14.0–49.7)
MONO#: 0.6 10*3/uL (ref 0.1–0.9)
NEUT#: 4 10*3/uL (ref 1.5–6.5)
Platelets: 321 10*3/uL (ref 145–400)
RBC: 3.31 10*6/uL — ABNORMAL LOW (ref 3.70–5.45)
WBC: 6.6 10*3/uL (ref 3.9–10.3)
lymph#: 1.7 10*3/uL (ref 0.9–3.3)

## 2011-11-03 LAB — COMPREHENSIVE METABOLIC PANEL (CC13)
ALT: 11 U/L (ref 0–55)
Albumin: 3 g/dL — ABNORMAL LOW (ref 3.5–5.0)
CO2: 21 mEq/L — ABNORMAL LOW (ref 22–29)
Calcium: 9.4 mg/dL (ref 8.4–10.4)
Chloride: 106 mEq/L (ref 98–107)
Glucose: 95 mg/dl (ref 70–99)
Potassium: 4.5 mEq/L (ref 3.5–5.1)
Sodium: 139 mEq/L (ref 136–145)
Total Bilirubin: 0.4 mg/dL (ref 0.20–1.20)
Total Protein: 6.7 g/dL (ref 6.4–8.3)

## 2011-11-03 MED ORDER — HEPARIN SOD (PORK) LOCK FLUSH 100 UNIT/ML IV SOLN
500.0000 [IU] | Freq: Once | INTRAVENOUS | Status: AC
  Administered 2011-11-03: 500 [IU] via INTRAVENOUS
  Filled 2011-11-03: qty 5

## 2011-11-03 MED ORDER — SODIUM CHLORIDE 0.9 % IJ SOLN
10.0000 mL | INTRAMUSCULAR | Status: DC | PRN
  Administered 2011-11-03: 10 mL via INTRAVENOUS
  Filled 2011-11-03: qty 10

## 2011-11-03 MED ORDER — CYANOCOBALAMIN 1000 MCG/ML IJ SOLN
1000.0000 ug | Freq: Once | INTRAMUSCULAR | Status: AC
  Administered 2011-11-03: 1000 ug via INTRAMUSCULAR

## 2011-11-03 MED ORDER — DARBEPOETIN ALFA-POLYSORBATE 200 MCG/0.4ML IJ SOLN
200.0000 ug | Freq: Once | INTRAMUSCULAR | Status: AC
  Administered 2011-11-03: 200 ug via SUBCUTANEOUS
  Filled 2011-11-03: qty 0.4

## 2011-11-10 ENCOUNTER — Encounter: Payer: Self-pay | Admitting: Internal Medicine

## 2011-11-10 ENCOUNTER — Other Ambulatory Visit: Payer: Medicare Other | Admitting: Lab

## 2011-11-10 ENCOUNTER — Ambulatory Visit (INDEPENDENT_AMBULATORY_CARE_PROVIDER_SITE_OTHER): Payer: Medicare Other | Admitting: Internal Medicine

## 2011-11-10 VITALS — BP 130/80 | HR 70 | Temp 98.1°F | Resp 18 | Wt 246.0 lb

## 2011-11-10 DIAGNOSIS — R269 Unspecified abnormalities of gait and mobility: Secondary | ICD-10-CM

## 2011-11-10 DIAGNOSIS — Z23 Encounter for immunization: Secondary | ICD-10-CM | POA: Insufficient documentation

## 2011-11-10 DIAGNOSIS — R2681 Unsteadiness on feet: Secondary | ICD-10-CM

## 2011-11-13 ENCOUNTER — Telehealth: Payer: Self-pay | Admitting: Internal Medicine

## 2011-11-13 ENCOUNTER — Telehealth: Payer: Self-pay | Admitting: *Deleted

## 2011-11-13 MED ORDER — ATORVASTATIN CALCIUM 40 MG PO TABS: 40.0000 mg | ORAL_TABLET | Freq: Every day | ORAL | Status: DC

## 2011-11-13 NOTE — Telephone Encounter (Signed)
Pt left vm requesting samples of Lantus or to change to cheaper alternative as she is now in the donut hole. We do not have any samples at this time. Please advise- ok to change?

## 2011-11-13 NOTE — Telephone Encounter (Signed)
Done

## 2011-11-14 MED ORDER — INSULIN DETEMIR 100 UNIT/ML ~~LOC~~ SOLN: SUBCUTANEOUS | Status: DC

## 2011-11-14 NOTE — Telephone Encounter (Signed)
Informed patient that we have Insulin Samples ready for p/u: Mon-Fri 8a-5p [in med refrigerator/clinic]/SLS

## 2011-11-14 NOTE — Telephone Encounter (Signed)
If we don't have lantus samples do we have levemir samples?

## 2011-11-15 ENCOUNTER — Ambulatory Visit (INDEPENDENT_AMBULATORY_CARE_PROVIDER_SITE_OTHER): Payer: Medicare Other | Admitting: Internal Medicine

## 2011-11-15 ENCOUNTER — Encounter: Payer: Self-pay | Admitting: Internal Medicine

## 2011-11-15 VITALS — BP 128/82 | HR 88 | Ht 63.0 in | Wt 244.6 lb

## 2011-11-15 DIAGNOSIS — R1011 Right upper quadrant pain: Secondary | ICD-10-CM

## 2011-11-15 DIAGNOSIS — D649 Anemia, unspecified: Secondary | ICD-10-CM

## 2011-11-15 DIAGNOSIS — C7952 Secondary malignant neoplasm of bone marrow: Secondary | ICD-10-CM

## 2011-11-15 DIAGNOSIS — C7951 Secondary malignant neoplasm of bone: Secondary | ICD-10-CM

## 2011-11-15 DIAGNOSIS — R1012 Left upper quadrant pain: Secondary | ICD-10-CM

## 2011-11-15 DIAGNOSIS — R10811 Right upper quadrant abdominal tenderness: Secondary | ICD-10-CM

## 2011-11-15 DIAGNOSIS — K219 Gastro-esophageal reflux disease without esophagitis: Secondary | ICD-10-CM

## 2011-11-15 DIAGNOSIS — C50919 Malignant neoplasm of unspecified site of unspecified female breast: Secondary | ICD-10-CM

## 2011-11-15 NOTE — Progress Notes (Signed)
Subjective:    Patient ID: Brittney Cunningham, female    DOB: Jun 05, 1940, 71 y.o.   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 She is followed by Dr. Daneil Dolin and that. She has a known chronic anemia. She was having dyspnea this summer, she saw cardiology and there was concern about this persistent anemia and its possible relationship to her dyspnea. An echocardiogram was performed and showed no significant abnormality. R. graft she reports that this summer she started to have increasing indigestion and heartburn. This has worsened over time. In September she had a four-day spell of diarrhea when vacationing in [place], her husband had the same problem. He has recovered completely though it took a few weeks. She is having increasing problems with heartburn and pressure in the epigastrium as well as a bandlike upper abdominal pain at times. She has early satiety and increasing reflux and regurgitation. She has been on omeprazole, proton X. and Dexilant without significant benefit. She waits at least 2 hours before lying down at night. However she gets a pressure and has to sit up and wait for that to pass. She is not describing dysphagia or nausea or vomiting. She has not had evidence of gastrointestinal bleeding. She has begun Aranesp for her chronic anemia. Bowel movements are normal at this point. She is avoiding fried and greasy foods. She has lost 9 kg since the summer. She notes that 2 years ago at that time she was diagnosed with her breast cancer she actually had similar symptoms.  Because of the anemia, cardiology has asked a question about whether or not she should have a colonoscopy since she never had one. She says Dr. Darnelle Catalan has not recommended one to her though it has not come up. Allergies  Allergen Reactions  . Chlorhexidine Hives, Itching and Rash    This was most likely a CONTACT DERMATITIS versus true systemic  allergic reaction  . Adhesive (Tape) Hives  . Codeine Swelling   Outpatient Prescriptions Prior to Visit  Medication Sig Dispense Refill  . aspirin 81 MG chewable tablet Chew 81 mg by mouth daily.      Marland Kitchen atorvastatin (LIPITOR) 40 MG tablet Take 1 tablet (40 mg total) by mouth daily.  90 tablet  1  . bisacodyl (DULCOLAX) 5 MG EC tablet Take 5 mg by mouth daily as needed. For constipation      . calcium carbonate (OS-CAL) 600 MG TABS Take 600 mg by mouth 2 (two) times daily with a meal.        . carvedilol (COREG) 12.5 MG tablet Take 1 tablet (12.5 mg total) by mouth 2 (two) times daily with a meal.  60 tablet  11  . chlorhexidine (PERIDEX) 0.12 % solution Rinse for 90  seconds  with 15 mLs three times daily. Spit out excess. Do NOT swallow.      . citalopram (CELEXA) 20 MG tablet Take 1 tablet (20 mg total) by mouth daily.  30 tablet  1  . clopidogrel (PLAVIX) 75 MG tablet Take 1 tablet (75 mg total) by mouth daily.  30 tablet  6  . cyanocobalamin (,VITAMIN B-12,) 1000 MCG/ML injection Inject 1,000 mcg into the muscle every 30 (thirty) days. Last dose 03/09/2011      . darbepoetin (ARANESP) 200 MCG/0.4ML SOLN Inject 200 mcg into the skin once. Every 2 weeks      . dexlansoprazole (DEXILANT) 60 MG capsule Take  1 capsule (60 mg total) by mouth daily.  10 capsule  0  . gabapentin (NEURONTIN) 300 MG capsule Take 1 capsule (300 mg total) by mouth at bedtime as needed.  30 capsule  5  . hydrochlorothiazide (MICROZIDE) 12.5 MG capsule Take 1 capsule (12.5 mg total) by mouth daily.  30 capsule  6  . ibuprofen (ADVIL) 200 MG tablet Take 200 mg by mouth 3 (three) times daily.        . insulin detemir (LEVEMIR) 100 UNIT/ML injection SAMPLES GIVEN [NO LANTUS AVAILABLE]  6 mL  0  . insulin glargine (LANTUS) 100 UNIT/ML injection Inject 60 Units into the skin at bedtime.       Marland Kitchen letrozole (FEMARA) 2.5 MG tablet Take 1 tablet (2.5 mg total) by mouth daily.  30 tablet  11  . levothyroxine (SYNTHROID) 200 MCG  tablet Take 1 tablet (200 mcg total) by mouth daily.  30 tablet  11  . levothyroxine (SYNTHROID, LEVOTHROID) 50 MCG tablet Take 1 tablet (50 mcg total) by mouth daily. Dosage change, please take with 200 mcg tablet.  30 tablet  3  . levothyroxine (SYNTHROID, LEVOTHROID) 75 MCG tablet Take 75 mcg by mouth daily.      Marland Kitchen LORazepam (ATIVAN) 0.5 MG tablet Take 1 tab by mouth twice a day as needed for anxiety  60 tablet  0  . losartan (COZAAR) 50 MG tablet Take 1 tablet (50 mg total) by mouth daily.  30 tablet  6  . metFORMIN (GLUCOPHAGE) 500 MG tablet Take 1 tablet (500 mg total) by mouth every morning.  30 tablet  5  . oxyCODONE-acetaminophen (PERCOCET/ROXICET) 5-325 MG per tablet Take 1 tablet by mouth 4 (four) times daily as needed. For pain  120 tablet  0  . pantoprazole (PROTONIX) 40 MG tablet Take 1 tablet (40 mg total) by mouth daily.  30 tablet  11   Past Medical History  Diagnosis Date  . Anemia   . Depression   . Diabetes mellitus type II   . GERD (gastroesophageal reflux disease)   . Hyperlipidemia   . Hypothyroidism   . Osteoarthritis   . Carotid artery stenosis     bilateral  . CAD (coronary artery disease)   . OSA (obstructive sleep apnea)   . Breast cancer     right breast cancer-invasive  ductal carcinoma (StageIV)  . Hypertension   . Anxiety   . Carotid artery occlusion    Past Surgical History  Procedure Date  . Incisional breast biopsy     remoted left breast biopsy  . Cesarean section   . Other surgical history     GYN surgery  . Knee surgery     right knee surgery  . Carotid endarterectomy     right carotid  . Port-a-cath removal   . Modified mastectomy     Right breast  . Breast surgery   . Mastectomy   . Carotid stent     left   History   Social History  . Marital Status: Married    Spouse Name: N/A    Number of Children: 3  .     Occupational History  . 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 Smoking status:  Former Smoker -- 1.0 packs/day for 20 years    Types: Cigarettes    Quit date: 01/31/1988  . Smokeless tobacco: Never Used  . Alcohol Use: No  . Drug Use: No  Social History Narrative   Retired-ran a daycare facility for 40 years.Married- 43 years 2 sons 1 daughter - Artist smoker-quit in 23.    Family History  Problem Relation Age of Onset  . Arthritis    . Coronary artery disease      first degree relative  . Stroke      first degree relative  . Diabetes Mother   . Heart disease Mother   . Hypertension Mother       Review of Systems This is positive for things mentioned in the history of present illness. She has dyspnea on exertion which is a little better. She does not have orthopnea. She has diffuse joint pains. She wears eyeglasses. There is fatigued. She-bilateral facial and jaw pain, she has a history of osteonecrosis of the right jaw. She is being seen at [facility] for that. She does have put pain and was told that it was peripheral neuropathy there is no I. damage from her diabetes that she is aware of. She has difficulty sleeping mostly related to the symptoms described above. All other review of systems are negative at this time.    Objective:   Physical Exam General:  Well-developed, well-nourished and in no acute distress- obese Eyes:  anicteric. ENT:   Mouth and posterior pharynx free of lesions.  Neck:   supple w/o thyromegaly or mass.  Lungs: Clear to auscultation bilaterally. - r lateral and anterior scar Heart:  S1S2, no rubs, murmurs, gallops. Abdomen:  obese, tender with fullness ? Hepatomegaly in RUQ, no splash, + BS, epigastric and bilateral bruits in mid-abdomen, no hernia Lymph:  no cervical, inguinal or supraclavicular adenopathy. Extremities:   Trace bilateral LE edema Skin   no rash. Neuro:  A&O x 3.  Psych:  appropriate mood and  Affect.   Data Reviewed: PCP, oncology, cardiology notes xrays - bone scans, CT (2011) abd/pelvis chest  w/ images, labs in EMR      Assessment & Plan:   1. Reflux   2. Bilateral upper abdominal pain   3. RUQ abdominal tenderness   4. Breast cancer metastasized to bone   5. Chronic anemia    Other key issues/findings: 1. Vascular disease with abdominal bruits 2. Diabetes mellitus type 2 with neuropathy 3. Obesity 4. Carotid stent on Plavix  This is a complicated situation and I'm not sure what her problem is coming from. Given the history of metastatic breast cancer, the tenderness in the right abdomen in conjunction with the symptoms ongoing to proceed with a CT scan of the abdomen and pelvis with contrast first. She could have breast cancer metastasizing into the abdomen leading to some of the symptoms. I don't think she has a paraneoplastic syndrome. A post infectious irritable bowel or blood dysfunction process could be present as well as neuropathic gut dysfunction from her diabetes. Some of her symptoms do sound like gastric parasitosis she has not responded to PPI therapy. The bruits and her vascular disease raise some question of ischemic disease though her symptoms are somewhat atypical for that.  If her CT scan is unrevealing, the next step would be an upper GI endoscopy. She would not need to hold Plavix for that.  Colonoscopy might reveal something that could contribute to her anemia but overall I think would be of low yield at this point, and in the setting of stage IV breast cancer and all these other issues would not pursue that yet, and would seek Dr. Darrall Dears advice or have  proof of lower GI bleeding or heme positive stool prior to pursuing that.   I appreciate the opportunity to care for this patient.   CC: Estill Cotta, MD, Norma Fredrickson, NP, GUS Aundra Dubin, MD

## 2011-11-15 NOTE — Patient Instructions (Addendum)
You have been scheduled for a CT scan of the abdomen and pelvis at [facility] ([address]---this is in the same building as Architectural technologist).   You are scheduled on 11/20/11 at 9:30am. You should arrive 15 minutes prior to your appointment time for registration. Please follow the written instructions below on the day of your exam:  WARNING: IF YOU ARE ALLERGIC TO IODINE/X-RAY DYE, PLEASE NOTIFY RADIOLOGY IMMEDIATELY AT [phone]! YOU WILL BE GIVEN A 13 HOUR PREMEDICATION PREP.  1) Do not eat or drink anything after 5:30am (4 hours prior to your test) 2) You have been given 2 bottles of oral contrast to drink. The solution may taste better if refrigerated, but do NOT add ice or any other liquid to this solution. Shake well before drinking.    Drink 1 bottle of contrast @ 7:30 (2 hours prior to your exam)  Drink 1 bottle of contrast @ 8:30 (1 hour prior to your exam)  You may take any medications as prescribed with a small amount of water except for the following: Metformin, Glucophage, Glucovance, Avandamet, Riomet, Fortamet, Actoplus Met, Janumet, Glumetza or Metaglip. The above medications must be held the day of the exam AND 48 hours after the exam.  The purpose of you drinking the oral contrast is to aid in the visualization of your intestinal tract. The contrast solution may cause some diarrhea. Before your exam is started, you will be given a small amount of fluid to drink. Depending on your individual set of symptoms, you may also receive an intravenous injection of x-ray contrast/dye. Plan on being at [facility] for 30 minutes or long, depending on the type of exam you are having performed.  If you have any questions regarding your exam or if you need to reschedule, you may call the CT department at [phone] between the hours of 8:00 am and 5:00 pm, Monday-Friday.  We will call you with results and plans.  Thank you for choosing me and [facility].  Iva Boop, M.D., [facility]  ________________________________________________________________________

## 2011-11-16 ENCOUNTER — Other Ambulatory Visit: Payer: Medicare Other | Admitting: Lab

## 2011-11-17 ENCOUNTER — Ambulatory Visit (HOSPITAL_BASED_OUTPATIENT_CLINIC_OR_DEPARTMENT_OTHER): Payer: Self-pay

## 2011-11-17 ENCOUNTER — Ambulatory Visit (HOSPITAL_BASED_OUTPATIENT_CLINIC_OR_DEPARTMENT_OTHER): Payer: Medicare Other | Admitting: Lab

## 2011-11-17 VITALS — BP 129/55 | HR 62 | Temp 97.8°F

## 2011-11-17 DIAGNOSIS — D649 Anemia, unspecified: Secondary | ICD-10-CM

## 2011-11-17 DIAGNOSIS — C50919 Malignant neoplasm of unspecified site of unspecified female breast: Secondary | ICD-10-CM

## 2011-11-17 LAB — CBC WITH DIFFERENTIAL/PLATELET
Basophils Absolute: 0 10*3/uL (ref 0.0–0.1)
Eosinophils Absolute: 0.3 10*3/uL (ref 0.0–0.5)
HGB: 9 g/dL — ABNORMAL LOW (ref 11.6–15.9)
NEUT#: 4.4 10*3/uL (ref 1.5–6.5)
RDW: 17.2 % — ABNORMAL HIGH (ref 11.2–14.5)
WBC: 7.2 10*3/uL (ref 3.9–10.3)
lymph#: 1.7 10*3/uL (ref 0.9–3.3)

## 2011-11-17 LAB — COMPREHENSIVE METABOLIC PANEL (CC13)
AST: 8 U/L (ref 5–34)
Albumin: 3.2 g/dL — ABNORMAL LOW (ref 3.5–5.0)
BUN: 30 mg/dL — ABNORMAL HIGH (ref 7.0–26.0)
Calcium: 9.7 mg/dL (ref 8.4–10.4)
Chloride: 106 mEq/L (ref 98–107)
Glucose: 190 mg/dl — ABNORMAL HIGH (ref 70–99)
Potassium: 4.8 mEq/L (ref 3.5–5.1)
Total Protein: 6.7 g/dL (ref 6.4–8.3)

## 2011-11-17 MED ORDER — DARBEPOETIN ALFA-POLYSORBATE 200 MCG/0.4ML IJ SOLN
200.0000 ug | Freq: Once | INTRAMUSCULAR | Status: AC
  Administered 2011-11-17: 200 ug via SUBCUTANEOUS
  Filled 2011-11-17: qty 0.4

## 2011-11-17 NOTE — Progress Notes (Signed)
Hgb 9.0 Patient given copy of labs.

## 2011-11-20 ENCOUNTER — Ambulatory Visit (INDEPENDENT_AMBULATORY_CARE_PROVIDER_SITE_OTHER)
Admission: RE | Admit: 2011-11-20 | Discharge: 2011-11-20 | Disposition: A | Discharge status: Home / Self Care | Payer: Medicare Other | Source: Ambulatory Visit | Attending: Internal Medicine | Admitting: Internal Medicine

## 2011-11-20 DIAGNOSIS — R1012 Left upper quadrant pain: Secondary | ICD-10-CM

## 2011-11-20 DIAGNOSIS — R10811 Right upper quadrant abdominal tenderness: Secondary | ICD-10-CM

## 2011-11-20 DIAGNOSIS — R1011 Right upper quadrant pain: Secondary | ICD-10-CM

## 2011-11-20 MED ORDER — IOHEXOL 300 MG/ML  SOLN
100.0000 mL | Freq: Once | INTRAMUSCULAR | Status: AC | PRN
  Administered 2011-11-20: 100 mL via INTRAVENOUS

## 2011-11-21 NOTE — Progress Notes (Signed)
Quick Note:  CT does not show problem Needs EGD - is on insulin ______

## 2011-11-22 ENCOUNTER — Other Ambulatory Visit: Payer: Self-pay | Admitting: Medical Oncology

## 2011-11-22 DIAGNOSIS — C50911 Malignant neoplasm of unspecified site of right female breast: Secondary | ICD-10-CM

## 2011-11-22 MED ORDER — LORAZEPAM 0.5 MG PO TABS: ORAL_TABLET | ORAL | Status: DC

## 2011-11-24 ENCOUNTER — Ambulatory Visit (AMBULATORY_SURGERY_CENTER): Payer: Medicare Other | Admitting: *Deleted

## 2011-11-24 ENCOUNTER — Other Ambulatory Visit: Payer: Self-pay | Admitting: Lab

## 2011-11-24 VITALS — Ht 63.0 in | Wt 240.6 lb

## 2011-11-24 DIAGNOSIS — R1012 Left upper quadrant pain: Secondary | ICD-10-CM

## 2011-11-24 DIAGNOSIS — R1011 Right upper quadrant pain: Secondary | ICD-10-CM

## 2011-11-30 ENCOUNTER — Telehealth: Payer: Self-pay | Admitting: *Deleted

## 2011-11-30 ENCOUNTER — Other Ambulatory Visit: Payer: Self-pay | Admitting: *Deleted

## 2011-11-30 DIAGNOSIS — C50919 Malignant neoplasm of unspecified site of unspecified female breast: Secondary | ICD-10-CM

## 2011-11-30 DIAGNOSIS — C7951 Secondary malignant neoplasm of bone: Secondary | ICD-10-CM

## 2011-11-30 NOTE — Telephone Encounter (Signed)
Pt has made decision to request Motorized Scooter [instead of wheelchair], paperwork on hand is for wheelchair only; pt will contact supplier and have scooter paperwork faxed to our office/SLS

## 2011-12-01 ENCOUNTER — Encounter: Payer: Self-pay | Admitting: Internal Medicine

## 2011-12-01 ENCOUNTER — Other Ambulatory Visit: Payer: Self-pay | Admitting: Lab

## 2011-12-01 ENCOUNTER — Ambulatory Visit: Payer: Self-pay

## 2011-12-01 ENCOUNTER — Other Ambulatory Visit (HOSPITAL_BASED_OUTPATIENT_CLINIC_OR_DEPARTMENT_OTHER): Payer: Medicare Other | Admitting: Lab

## 2011-12-01 ENCOUNTER — Ambulatory Visit (AMBULATORY_SURGERY_CENTER): Payer: Medicare Other | Admitting: Internal Medicine

## 2011-12-01 ENCOUNTER — Ambulatory Visit (HOSPITAL_BASED_OUTPATIENT_CLINIC_OR_DEPARTMENT_OTHER): Payer: Medicare Other

## 2011-12-01 VITALS — BP 145/59 | HR 68 | Temp 97.7°F | Resp 13 | Ht 63.0 in | Wt 244.0 lb

## 2011-12-01 DIAGNOSIS — R1012 Left upper quadrant pain: Secondary | ICD-10-CM

## 2011-12-01 DIAGNOSIS — C50919 Malignant neoplasm of unspecified site of unspecified female breast: Secondary | ICD-10-CM

## 2011-12-01 DIAGNOSIS — D630 Anemia in neoplastic disease: Secondary | ICD-10-CM

## 2011-12-01 DIAGNOSIS — C7952 Secondary malignant neoplasm of bone marrow: Secondary | ICD-10-CM

## 2011-12-01 DIAGNOSIS — K2970 Gastritis, unspecified, without bleeding: Secondary | ICD-10-CM

## 2011-12-01 DIAGNOSIS — D649 Anemia, unspecified: Secondary | ICD-10-CM

## 2011-12-01 DIAGNOSIS — K2990 Gastroduodenitis, unspecified, without bleeding: Secondary | ICD-10-CM

## 2011-12-01 DIAGNOSIS — C7951 Secondary malignant neoplasm of bone: Secondary | ICD-10-CM

## 2011-12-01 DIAGNOSIS — R1011 Right upper quadrant pain: Secondary | ICD-10-CM

## 2011-12-01 DIAGNOSIS — K259 Gastric ulcer, unspecified as acute or chronic, without hemorrhage or perforation: Secondary | ICD-10-CM

## 2011-12-01 LAB — CBC WITH DIFFERENTIAL/PLATELET
BASO%: 0.7 % (ref 0.0–2.0)
Eosinophils Absolute: 0.2 10*3/uL (ref 0.0–0.5)
HCT: 27.4 % — ABNORMAL LOW (ref 34.8–46.6)
MCHC: 32.7 g/dL (ref 31.5–36.0)
MONO#: 0.7 10*3/uL (ref 0.1–0.9)
NEUT#: 3.9 10*3/uL (ref 1.5–6.5)
RBC: 3.27 10*6/uL — ABNORMAL LOW (ref 3.70–5.45)
WBC: 6.5 10*3/uL (ref 3.9–10.3)
lymph#: 1.6 10*3/uL (ref 0.9–3.3)

## 2011-12-01 LAB — COMPREHENSIVE METABOLIC PANEL (CC13)
ALT: 11 U/L (ref 0–55)
Albumin: 3.1 g/dL — ABNORMAL LOW (ref 3.5–5.0)
CO2: 23 mEq/L (ref 22–29)
Calcium: 9.7 mg/dL (ref 8.4–10.4)
Chloride: 106 mEq/L (ref 98–107)
Glucose: 237 mg/dl — ABNORMAL HIGH (ref 70–99)
Sodium: 136 mEq/L (ref 136–145)
Total Protein: 6.6 g/dL (ref 6.4–8.3)

## 2011-12-01 LAB — GLUCOSE, CAPILLARY
Glucose-Capillary: 238 mg/dL — ABNORMAL HIGH (ref 70–99)
Glucose-Capillary: 171 mg/dL — ABNORMAL HIGH (ref 70–99)

## 2011-12-01 MED ORDER — METOCLOPRAMIDE HCL 5 MG PO TABS: 5.0000 mg | ORAL_TABLET | Freq: Three times a day (TID) | ORAL | Status: DC

## 2011-12-01 MED ORDER — SODIUM CHLORIDE 0.9 % IJ SOLN
10.0000 mL | INTRAMUSCULAR | Status: DC | PRN
  Administered 2011-12-01: 10 mL via INTRAVENOUS
  Filled 2011-12-01: qty 10

## 2011-12-01 MED ORDER — HEPARIN SOD (PORK) LOCK FLUSH 100 UNIT/ML IV SOLN
500.0000 [IU] | Freq: Once | INTRAVENOUS | Status: AC
  Administered 2011-12-01: 500 [IU] via INTRAVENOUS
  Filled 2011-12-01: qty 5

## 2011-12-01 MED ORDER — CYANOCOBALAMIN 1000 MCG/ML IJ SOLN
1000.0000 ug | Freq: Once | INTRAMUSCULAR | Status: AC
  Administered 2011-12-01: 1000 ug via INTRAMUSCULAR

## 2011-12-01 MED ORDER — DARBEPOETIN ALFA-POLYSORBATE 200 MCG/0.4ML IJ SOLN
200.0000 ug | Freq: Once | INTRAMUSCULAR | Status: AC
  Administered 2011-12-01: 200 ug via SUBCUTANEOUS
  Filled 2011-12-01: qty 0.4

## 2011-12-01 MED ORDER — SODIUM CHLORIDE 0.9 % IV SOLN: 500.0000 mL | INTRAVENOUS | Status: DC

## 2011-12-01 NOTE — Op Note (Signed)
[facility] [address] [place], [address]   ENDOSCOPY PROCEDURE REPORT  PATIENT: Brittney Cunningham, Brittney Cunningham  MR#: [id number] BIRTHDATE: 01-16-1941 , 71  yrs. old GENDER: Female ENDOSCOPIST: Iva Boop, MD, [facility]  PROCEDURE DATE:  12/01/2011 PROCEDURE:  EGD w/ biopsy ASA CLASS:     Class III INDICATIONS:  abdominal pain in upper left quadrant.   abdominal pain in the upper right quadrant. MEDICATIONS: propofol (Diprivan) 300mg  IV, MAC sedation, administered by CRNA, and These medications were titrated to patient response per physician's verbal order TOPICAL ANESTHETIC: Cetacaine Spray  DESCRIPTION OF PROCEDURE: After the risks benefits and alternatives of the procedure were thoroughly explained, informed consent was obtained.  The LB [id number] [id number] endoscope was introduced through the mouth and advanced to the second portion of the duodenum. Without limitations.  The instrument was slowly withdrawn as the mucosa was fully examined.      STOMACH: Two small shallow ulcers were found in the gastric antrum. Biopsies were taken at edge of the ulcers and at the center of the ulcers.  The remainder of the upper endoscopy exam was otherwise normal. Retroflexed views revealed no abnormalities.     The scope was then withdrawn from the patient and the procedure completed.  COMPLICATIONS: There were no complications. ENDOSCOPIC IMPRESSION: 1.   Two small ulcers were found in the gastric antrum 2.   The remainder of the upper endoscopy exam was otherwise normal  RECOMMENDATIONS: await pathology results metaclopramide 5 mg ac/hs see me in 4-6 weeks    eSigned:  Iva Boop, MD, [facility] 12/01/2011 10:50 AM   NF:[id number] Lavon Paganini MD, Ruthann Cancer, MD, and The Patient

## 2011-12-01 NOTE — Progress Notes (Signed)
Last dose of Plavix today

## 2011-12-01 NOTE — Patient Instructions (Addendum)
There were 2 small stomach ulcers. They look innocent (do not look like cancer). I took biopsise to be sure and will let you know. I am going to have you try a medicine called metaclopramide - it helps the stomach empty better - will see if that makes you feel better. Like with any medicine, there are possible side effects and sometimes this medicine can cause some nerve damage if taken long-term. I am trying this on you short-term to see if it helps and it should not pose those risks. It will be written to take before meals and at bedtime but you may not need it that long.  Please schedule an appointment to see me in 4-6 weeks.(December)  Thank you for choosing me and  [facility].  Iva Boop, MD, FACG YOU HAD AN ENDOSCOPIC PROCEDURE TODAY AT THE [facility]: Refer to the procedure report that was given to you for any specific questions about what was found during the examination.  If the procedure report does not answer your questions, please call your gastroenterologist to clarify.  If you requested that your care partner not be given the details of your procedure findings, then the procedure report has been included in a sealed envelope for you to review at your convenience later.  YOU SHOULD EXPECT: Some feelings of bloating in the abdomen. Passage of more gas than usual.  Walking can help get rid of the air that was put into your GI tract during the procedure and reduce the bloating. If you had a lower endoscopy (such as a colonoscopy or flexible sigmoidoscopy) you may notice spotting of blood in your stool or on the toilet paper. If you underwent a bowel prep for your procedure, then you may not have a normal bowel movement for a few days.  DIET: Your first meal following the procedure should be a light meal and then it is ok to progress to your normal diet.  A half-sandwich or bowl of soup is an example of a good first meal.  Heavy or fried foods are harder to  digest and may make you feel nauseous or bloated.  Likewise meals heavy in dairy and vegetables can cause extra gas to form and this can also increase the bloating.  Drink plenty of fluids but you should avoid alcoholic beverages for 24 hours.  ACTIVITY: Your care partner should take you home directly after the procedure.  You should plan to take it easy, moving slowly for the rest of the day.  You can resume normal activity the day after the procedure however you should NOT DRIVE or use heavy machinery for 24 hours (because of the sedation medicines used during the test).    SYMPTOMS TO REPORT IMMEDIATELY: A gastroenterologist can be reached at any hour.  During normal business hours, 8:30 AM to 5:00 PM Monday through Friday, call [phone].  After hours and on weekends, please call the GI answering service at [phone] who will take a message and have the physician on call contact you.   Following lower endoscopy (colonoscopy or flexible sigmoidoscopy):  Excessive amounts of blood in the stool  Significant tenderness or worsening of abdominal pains  Swelling of the abdomen that is new, acute  Fever of 100F or higher  Following upper endoscopy (EGD)  Vomiting of blood or coffee ground material  New chest pain or pain under the shoulder blades  Painful or persistently difficult swallowing  New shortness of breath  Fever of  100F or higher  Black, tarry-looking stools  FOLLOW UP: If any biopsies were taken you will be contacted by phone or by letter within the next 1-3 weeks.  Call your gastroenterologist if you have not heard about the biopsies in 3 weeks.  Our staff will call the home number listed on your records the next business day following your procedure to check on you and address any questions or concerns that you may have at that time regarding the information given to you following your procedure. This is a courtesy call and so if there is no answer at the home number  and we have not heard from you through the emergency physician on call, we will assume that you have returned to your regular daily activities without incident.  SIGNATURES/CONFIDENTIALITY: You and/or your care partner have signed paperwork which will be entered into your electronic medical record.  These signatures attest to the fact that that the information above on your After Visit Summary has been reviewed and is understood.  Full responsibility of the confidentiality of this discharge information lies with you and/or your care-partner.

## 2011-12-01 NOTE — Progress Notes (Signed)
Patient did not experience any of the following events: a burn prior to discharge; a fall within the facility; wrong site/side/patient/procedure/implant event; or a hospital transfer or hospital admission upon discharge from the facility. (G8907) Patient did not have preoperative order for IV antibiotic SSI prophylaxis. (G8918)  

## 2011-12-01 NOTE — Progress Notes (Signed)
1038 - Txfer to recovery, VSS, drowsy but awake. Report to [facility]

## 2011-12-02 ENCOUNTER — Ambulatory Visit: Payer: Self-pay

## 2011-12-04 ENCOUNTER — Telehealth: Payer: Self-pay | Admitting: *Deleted

## 2011-12-04 NOTE — Telephone Encounter (Signed)
  Follow up Call-  Call back number 12/01/2011  Post procedure Call Back phone  # [phone]  Permission to leave phone message Yes     Patient questions:  Do you have a fever, pain , or abdominal swelling? no Pain Score  0 *  Have you tolerated food without any problems? yes  Have you been able to return to your normal activities? yes  Do you have any questions about your discharge instructions: Diet   no Medications  no Follow up visit  no  Do you have questions or concerns about your Care? no  Actions: * If pain score is 4 or above: No action needed, pain <4.

## 2011-12-05 ENCOUNTER — Encounter: Payer: Self-pay | Admitting: Internal Medicine

## 2011-12-08 ENCOUNTER — Ambulatory Visit: Payer: Self-pay

## 2011-12-10 DIAGNOSIS — R2681 Unsteadiness on feet: Secondary | ICD-10-CM | POA: Insufficient documentation

## 2011-12-10 NOTE — Progress Notes (Signed)
  Subjective:    Patient ID: Brittney Cunningham, female    DOB: 1940/11/29, 71 y.o.   MRN: [id number]  HPI Pt presents to clinic for power mobility device evaluation. Has h/o unsteadiness with recurrent falls. Also with DOE with 10-15 yards ambulation. As a result attempts to use a manual wheelchair but cannot self propel. 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 States can only hold a few pounds in her right hand/arm. Requires assistance with her ADL's including dressing and bathing. Can self transfer without assistance. She is capable of using a power device and her home is accessible.    Past Medical History  Diagnosis Date  . Anemia   . Depression   . Diabetes mellitus type II   . GERD (gastroesophageal reflux disease)   . Hyperlipidemia   . Hypothyroidism   . Osteoarthritis   . Carotid artery stenosis     bilateral  . CAD (coronary artery disease)   . OSA (obstructive sleep apnea)   . Breast cancer     right breast cancer-invasive  ductal carcinoma (StageIV)  . Hypertension   . Anxiety   . Carotid artery occlusion    Past Surgical History  Procedure Date  . Incisional breast biopsy     remoted left breast biopsy  . Cesarean section   . Other surgical history     GYN surgery  . Knee surgery     right knee surgery  . Carotid endarterectomy     right carotid  . Port-a-cath removal   . Modified mastectomy     Right breast  . Breast surgery   . Mastectomy   . Carotid stent     left    reports that she quit smoking about 23 years ago. Her smoking use included Cigarettes. She has a 20 pack-year smoking history. She has never used smokeless tobacco. She reports that she does not drink alcohol or use illicit drugs. family history includes Arthritis in an unspecified family member; Coronary artery disease in an unspecified family member; Diabetes in her mother; Heart disease in her mother; Hypertension in her mother; and Stroke in  an unspecified family member.  There is no history of Colon cancer and Stomach cancer. Allergies  Allergen Reactions  . Chlorhexidine Hives, Itching and Rash    This was most likely a CONTACT DERMATITIS versus true systemic allergic reaction  . Adhesive (Tape) Hives  . Codeine Swelling     Review of Systems see hpi     Objective:   Physical Exam  Nursing note and vitals reviewed. Constitutional: She appears well-developed and well-nourished. No distress.  HENT:  Head: Normocephalic and atraumatic.  Eyes: Conjunctivae normal are normal. No scleral icterus.  Musculoskeletal:       Gait slow/unsteady. LUE strenth 5/5. RUE 4+/5. Bilateral LE strenght 5/5.  Neurological: She is alert.  Skin: She is not diaphoretic.  Psychiatric: She has a normal mood and affect.          Assessment & Plan:

## 2011-12-10 NOTE — Assessment & Plan Note (Signed)
Believe patient to be an appropriate candidate for a power scooter. Paperwork completed

## 2011-12-12 ENCOUNTER — Other Ambulatory Visit: Payer: Self-pay | Admitting: *Deleted

## 2011-12-12 MED ORDER — OXYCODONE-ACETAMINOPHEN 5-325 MG PO TABS: 1.0000 | ORAL_TABLET | Freq: Four times a day (QID) | ORAL | Status: DC | PRN

## 2011-12-13 NOTE — Telephone Encounter (Signed)
Per Mobility Device servicer [[employer]]; Ok to complete & fax power wheelchair paperwork for pt to request power scooter, done/SLS

## 2011-12-15 ENCOUNTER — Ambulatory Visit (HOSPITAL_BASED_OUTPATIENT_CLINIC_OR_DEPARTMENT_OTHER): Payer: Medicare Other

## 2011-12-15 ENCOUNTER — Other Ambulatory Visit (HOSPITAL_BASED_OUTPATIENT_CLINIC_OR_DEPARTMENT_OTHER): Payer: Medicare Other | Admitting: Lab

## 2011-12-15 ENCOUNTER — Other Ambulatory Visit: Payer: Self-pay | Admitting: *Deleted

## 2011-12-15 ENCOUNTER — Ambulatory Visit: Payer: Self-pay

## 2011-12-15 VITALS — BP 163/45 | HR 65 | Temp 98.4°F

## 2011-12-15 DIAGNOSIS — C50919 Malignant neoplasm of unspecified site of unspecified female breast: Secondary | ICD-10-CM

## 2011-12-15 DIAGNOSIS — D630 Anemia in neoplastic disease: Secondary | ICD-10-CM

## 2011-12-15 LAB — CBC WITH DIFFERENTIAL/PLATELET
BASO%: 0.7 % (ref 0.0–2.0)
LYMPH%: 27.1 % (ref 14.0–49.7)
MCHC: 32.1 g/dL (ref 31.5–36.0)
MONO#: 0.7 10*3/uL (ref 0.1–0.9)
RBC: 3.27 10*6/uL — ABNORMAL LOW (ref 3.70–5.45)
RDW: 17.6 % — ABNORMAL HIGH (ref 11.2–14.5)
WBC: 7.1 10*3/uL (ref 3.9–10.3)
lymph#: 1.9 10*3/uL (ref 0.9–3.3)

## 2011-12-15 MED ORDER — DARBEPOETIN ALFA-POLYSORBATE 200 MCG/0.4ML IJ SOLN
200.0000 ug | Freq: Once | INTRAMUSCULAR | Status: AC
  Administered 2011-12-15: 200 ug via SUBCUTANEOUS
  Filled 2011-12-15: qty 0.4

## 2011-12-19 ENCOUNTER — Telehealth: Payer: Self-pay | Admitting: Internal Medicine

## 2011-12-19 MED ORDER — LOSARTAN POTASSIUM 50 MG PO TABS: 50.0000 mg | ORAL_TABLET | Freq: Every day | ORAL | Status: DC

## 2011-12-19 NOTE — Telephone Encounter (Signed)
Refill- losartan potassium 50mg  tab. Take one tablet by mouth every day. Qty 30 last fill 10.23.13

## 2011-12-19 NOTE — Telephone Encounter (Signed)
Rx to pharmacy/SLS 

## 2011-12-21 ENCOUNTER — Telehealth: Payer: Self-pay | Admitting: *Deleted

## 2011-12-21 ENCOUNTER — Telehealth: Payer: Self-pay | Admitting: Oncology

## 2011-12-21 ENCOUNTER — Other Ambulatory Visit (HOSPITAL_BASED_OUTPATIENT_CLINIC_OR_DEPARTMENT_OTHER): Payer: Medicare Other | Admitting: Lab

## 2011-12-21 ENCOUNTER — Ambulatory Visit (HOSPITAL_BASED_OUTPATIENT_CLINIC_OR_DEPARTMENT_OTHER): Payer: Medicare Other | Admitting: Oncology

## 2011-12-21 VITALS — BP 139/65 | HR 72 | Temp 99.1°F | Resp 20 | Ht 63.0 in | Wt 240.3 lb

## 2011-12-21 DIAGNOSIS — C7951 Secondary malignant neoplasm of bone: Secondary | ICD-10-CM

## 2011-12-21 DIAGNOSIS — C50919 Malignant neoplasm of unspecified site of unspecified female breast: Secondary | ICD-10-CM

## 2011-12-21 DIAGNOSIS — M8708 Idiopathic aseptic necrosis of bone, other site: Secondary | ICD-10-CM

## 2011-12-21 LAB — CBC WITH DIFFERENTIAL/PLATELET
BASO%: 0.8 % (ref 0.0–2.0)
Basophils Absolute: 0.1 10*3/uL (ref 0.0–0.1)
HCT: 28.4 % — ABNORMAL LOW (ref 34.8–46.6)
LYMPH%: 25.5 % (ref 14.0–49.7)
MCHC: 32.2 g/dL (ref 31.5–36.0)
MONO#: 0.7 10*3/uL (ref 0.1–0.9)
NEUT%: 60.5 % (ref 38.4–76.8)
Platelets: 317 10*3/uL (ref 145–400)
WBC: 7.3 10*3/uL (ref 3.9–10.3)

## 2011-12-21 LAB — COMPREHENSIVE METABOLIC PANEL (CC13)
ALT: 10 U/L (ref 0–55)
BUN: 29 mg/dL — ABNORMAL HIGH (ref 7.0–26.0)
CO2: 26 mEq/L (ref 22–29)
Creatinine: 1.1 mg/dL (ref 0.6–1.1)
Glucose: 194 mg/dl — ABNORMAL HIGH (ref 70–99)
Total Bilirubin: 0.36 mg/dL (ref 0.20–1.20)

## 2011-12-21 MED ORDER — KETOCONAZOLE 2 % EX CREA: TOPICAL_CREAM | Freq: Every day | CUTANEOUS | Status: DC

## 2011-12-21 NOTE — Progress Notes (Signed)
ID: Brittney Cunningham   DOB: [birth year]  MR#: [id number]  CSN#:[id number]  PCP: Estill Cotta, MD GYN: SU: Emelia Loron OTHER MD: Cindra Eves, Dorothy Puffer  HISTORY OF PRESENT ILLNESS: Brittney Cunningham was diagnosed with right breast carcinoma in May of 2011, a biopsy showing a grade 2 invasive ductal carcinoma, T2 NXM1, stage IV at presentation. There was bone only involvement. Tumor was strongly ER and PR positive, HER-2/neu negative, with an MIP-1 of 26%.  She was treated neoadjuvantly with letrozole and zoledronic acid beginning in June of 2011. She is status post right modified radical mastectomy in February 2012 for a ypT2 ypN2, grade 1 invasive ductal carcinoma. Margins were negative. Her subsequent history is as detailed below  INTERVAL HISTORY: Brittney Cunningham returns today for followup of her metastatic breast carcinoma. The interval history is significant for a trip to [facility] in September, resulting in significant diarrhea. She was evaluated by Dr. Elenore Paddy with a CT of the abdomen which showed no involvement of the liver or lower lungs by her cancer. EEG didn't show some benign ulcers. The importance to [place] from all this is the fact that she has had a significant drop in her ferritin, likely of interfering with her response to the aranesp  REVIEW OF SYSTEMS: She continues to have problems with 13th, and wishes some of them could be cold. She takes oxycodone and occasionally Motrin for this. She her sleep is now better since she was started on Reglan. She continues to have significant knee pain, which limits her ambulation. She feels anxious but not depressed and she is looking forward to the holidays with family. She is not sure how her sugars are doing. Otherwise a detailed review of systems today was stable  PAST MEDICAL HISTORY: Past Medical History  Diagnosis Date  . Anemia   . Depression   . Diabetes mellitus type II   . GERD (gastroesophageal reflux disease)   . Hyperlipidemia     . Hypothyroidism   . Osteoarthritis   . Carotid artery stenosis     bilateral  . CAD (coronary artery disease)   . OSA (obstructive sleep apnea)   . Breast cancer     right breast cancer-invasive  ductal carcinoma (StageIV)  . Hypertension   . Anxiety   . Carotid artery occlusion     PAST SURGICAL HISTORY: Past Surgical History  Procedure Date  . Incisional breast biopsy     remoted left breast biopsy  . Cesarean section   . Other surgical history     GYN surgery  . Knee surgery     right knee surgery  . Carotid endarterectomy     right carotid  . Port-a-cath removal   . Modified mastectomy     Right breast  . Breast surgery   . Mastectomy   . Carotid stent     left    FAMILY HISTORY Family History  Problem Relation Age of Onset  . Arthritis    . Coronary artery disease      first degree relative  . Stroke      first degree relative  . Diabetes Mother   . Heart disease Mother   . Hypertension Mother   . Colon cancer Neg Hx   . Stomach cancer Neg Hx   The patient's father died from pancreatic cancer at the age of 30. The patient's mother died from complications of diabetes and heart disease at the age of 26. The patient has 2 sisters and  2 brothers. There is no history of breast cancer or ovarian cancer in the immediate family. One of the patient's paternal aunts out of 6 paternal aunts had breast cancer diagnosed in her 27s.    GYNECOLOGIC HISTORY: The patient is GX, P3. First pregnancy to term age 54. She went through the change of life in her late 53s. She never took hormones.   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 Her husband,, [name], is disabled secondary to a fall. He has difficulty with walking. He used to work for [employer] previously. The patient's son, [name],  lives in [place], and works for [employer]. Daughter, [name], lives in [place], and works for [employer]. Daughter, [name],  lives in  [place], [place], and works as a Statistician. The patient attends [employer].   ADVANCED DIRECTIVES:  HEALTH MAINTENANCE: History  Substance Use Topics  . Smoking status: Former Smoker -- 1.0 packs/day for 20 years    Types: Cigarettes    Quit date: 01/31/1988  . Smokeless tobacco: Never Used  . Alcohol Use: No     Colonoscopy:  PAP:  Bone density:  Lipid panel:  Allergies  Allergen Reactions  . Chlorhexidine Hives, Itching and Rash    This was most likely a CONTACT DERMATITIS versus true systemic allergic reaction  . Adhesive (Tape) Hives  . Codeine Swelling    Current Outpatient Prescriptions  Medication Sig Dispense Refill  . aspirin 81 MG chewable tablet Chew 81 mg by mouth daily.      Marland Kitchen atorvastatin (LIPITOR) 40 MG tablet Take 1 tablet (40 mg total) by mouth daily.  90 tablet  1  . bisacodyl (DULCOLAX) 5 MG EC tablet Take 5 mg by mouth daily as needed. For constipation      . calcium carbonate (OS-CAL) 600 MG TABS Take 600 mg by mouth 2 (two) times daily with a meal.        . carvedilol (COREG) 12.5 MG tablet Take 1 tablet (12.5 mg total) by mouth 2 (two) times daily with a meal.  60 tablet  11  . chlorhexidine (PERIDEX) 0.12 % solution Rinse for 90  seconds  with 15 mLs three times daily. Spit out excess. Do NOT swallow.      . citalopram (CELEXA) 20 MG tablet Take 1 tablet (20 mg total) by mouth daily.  30 tablet  1  . clopidogrel (PLAVIX) 75 MG tablet Take 1 tablet (75 mg total) by mouth daily.  30 tablet  6  . cyanocobalamin (,VITAMIN B-12,) 1000 MCG/ML injection Inject 1,000 mcg into the muscle every 30 (thirty) days. Last dose 03/09/2011      . darbepoetin (ARANESP) 200 MCG/0.4ML SOLN Inject 200 mcg into the skin once. Every 2 weeks      . dexlansoprazole (DEXILANT) 60 MG capsule Take 1 capsule (60 mg total) by mouth daily.  10 capsule  0  . fluconazole (DIFLUCAN) 100 MG tablet Take 100 mg by mouth daily.       . hydrochlorothiazide  (MICROZIDE) 12.5 MG capsule Take 1 capsule (12.5 mg total) by mouth daily.  30 capsule  6  . ibuprofen (ADVIL) 200 MG tablet Take 200 mg by mouth 3 (three) times daily.        . insulin detemir (LEVEMIR) 100 UNIT/ML injection SAMPLES GIVEN [NO LANTUS AVAILABLE]  6 mL  0  . insulin glargine (LANTUS) 100 UNIT/ML injection Inject 60 Units into the skin at bedtime.       Marland Kitchen  letrozole (FEMARA) 2.5 MG tablet Take 1 tablet (2.5 mg total) by mouth daily.  30 tablet  11  . levothyroxine (SYNTHROID) 200 MCG tablet Take 1 tablet (200 mcg total) by mouth daily.  30 tablet  11  . levothyroxine (SYNTHROID, LEVOTHROID) 75 MCG tablet Take 75 mcg by mouth daily.      Marland Kitchen LORazepam (ATIVAN) 0.5 MG tablet Take 1 tab by mouth twice a day as needed for anxiety  60 tablet  0  . losartan (COZAAR) 50 MG tablet Take 1 tablet (50 mg total) by mouth daily.  30 tablet  6  . metFORMIN (GLUCOPHAGE) 500 MG tablet Take 1 tablet (500 mg total) by mouth every morning.  30 tablet  5  . metoCLOPramide (REGLAN) 5 MG tablet Take 1 tablet (5 mg total) by mouth 4 (four) times daily -  before meals and at bedtime.  120 tablet  1  . oxyCODONE-acetaminophen (PERCOCET/ROXICET) 5-325 MG per tablet Take 1 tablet by mouth 4 (four) times daily as needed. For pain  120 tablet  0  . [DISCONTINUED] simvastatin (ZOCOR) 40 MG tablet Take 40 mg by mouth every evening.        OBJECTIVE: Elderly white female in no acute distress. Filed Vitals:   12/21/11 1011  BP: 139/65  Pulse: 72  Temp: 99.1 F (37.3 C)  Resp: 20     Body mass index is 42.57 kg/(m^2).    ECOG FS: 2  Filed Weights   12/21/11 1011  Weight: 240 lb 4.8 oz (108.999 kg)   Sclerae unicteric Oropharynx shows the area of osteonecrosis in the right maxilla, with visible sequestrant. There is no erythema or purulence  No cervical or supraclavicular adenopathy Lungs no rales or rhonchi Heart regular rate and rhythm Abd benign MSK no focal spinal tenderness Neuro: nonfocal Breasts:  Status post right mastectomy, no evidence of local recurrence. The right axilla is benign. The left breast is unremarkable.    LAB RESULTS: Lab Results  Component Value Date   WBC 7.3 12/21/2011   NEUTROABS 4.4 12/21/2011   HGB 9.1* 12/21/2011   HCT 28.4* 12/21/2011   MCV 85.8 12/21/2011   PLT 317 12/21/2011      Chemistry      Component Value Date/Time   NA 136 12/01/2011 0808   NA 137 09/12/2011 1516   K 4.4 12/01/2011 0808   K 5.0 09/12/2011 1516   CL 106 12/01/2011 0808   CL 105 09/12/2011 1516   CO2 23 12/01/2011 0808   CO2 22 09/12/2011 1516   BUN 22.0 12/01/2011 0808   BUN 27* 09/12/2011 1516   CREATININE 1.0 12/01/2011 0808   CREATININE 1.4* 09/12/2011 1516      Component Value Date/Time   CALCIUM 9.7 12/01/2011 0808   CALCIUM 9.2 09/12/2011 1516   ALKPHOS 114 12/01/2011 0808   ALKPHOS 90 08/11/2011 0853   AST 10 12/01/2011 0808   AST 12 08/11/2011 0853   ALT 11 12/01/2011 0808   ALT 11 08/11/2011 0853   BILITOT 0.41 12/01/2011 0808   BILITOT 0.3 08/11/2011 0853     Results for Brittney Cunningham, Brittney Cunningham (MRN [id number]) as of 12/21/2011 10:18  Ref. Range 06/10/2009 19:41 07/27/2009 08:09 09/12/2011 15:16  Ferritin Latest Range: 10.0-291.0 ng/mL 797 (H) 432 (H) 115.4    Lab Results  Component Value Date   LABCA2 88* 09/28/2011    STUDIES:  No results found. CT scan of the abdomen October 2013 showed no evidence of liver or other parenchymal  involvement aside from bone (stable)   ASSESSMENT: A 71 year old [place] woman   (1) status post right breast biopsy May of 2011 for a grade 2 invasive ductal carcinoma, T2 NX M1, Stage IV, with bone-only involvement, strongly estrogen and progesterone receptor-positive, HER-2-negative with an MIB-1 of 26%.   (2) Neoadjuvantly she received letrozole and zoledronic acid beginning in June of 2011 and underwent right modified radical mastectomy February of 2012 for a ypT2 ypN2, grade 1 invasive ductal carcinoma with negative margins.   (3)  She completed radiation therapy in August of 2012.   (4) She has continued on letrozole but was switched from zoledronic acid to denosumab Brittney Cunningham) because of concerns regarding her serum creatinine. Brittney Cunningham was being given every 8 weeks.  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 She will return here in December 6 for her aranesp and I will add feraheme that visit. We will continue the B12 every 4 weeks and the port flushes every 6 weeks. She will see [place] again in 3 months. The plan is to continue letrozole until there is definite evidence of disease progression. She knows to call for any problems that may develop before the next visit.  MAGRINAT,GUSTAV C    12/21/2011

## 2011-12-21 NOTE — Telephone Encounter (Signed)
Per staff message and POF I have scheduled appts.  JMW  

## 2011-12-21 NOTE — Telephone Encounter (Signed)
The pt has her nov,dec 2013 appt calendars

## 2011-12-22 ENCOUNTER — Ambulatory Visit: Payer: Self-pay

## 2011-12-25 ENCOUNTER — Telehealth: Payer: Self-pay | Admitting: *Deleted

## 2011-12-25 MED ORDER — INSULIN GLARGINE 100 UNIT/ML ~~LOC~~ SOLN: 60.0000 [IU] | Freq: Every day | SUBCUTANEOUS | Status: DC

## 2011-12-25 NOTE — Telephone Encounter (Signed)
Pt request Samples of Lantus, going out of town Wednesday; one pen [3 mL] available, pt informed/SLS

## 2011-12-29 ENCOUNTER — Ambulatory Visit: Payer: Self-pay

## 2012-01-01 ENCOUNTER — Telehealth: Payer: Self-pay | Admitting: Internal Medicine

## 2012-01-01 MED ORDER — HYDROCHLOROTHIAZIDE 12.5 MG PO CAPS: 12.5000 mg | ORAL_CAPSULE | Freq: Every day | ORAL | Status: DC

## 2012-01-01 NOTE — Telephone Encounter (Signed)
Rx to pharmacy/SLS 

## 2012-01-01 NOTE — Telephone Encounter (Signed)
Refill- hydrochlorothiazide 12.5mg  cap. Take one capsule by mouth every day. Qty 30 last fill 11.2.13

## 2012-01-02 ENCOUNTER — Other Ambulatory Visit: Payer: Self-pay | Admitting: *Deleted

## 2012-01-02 DIAGNOSIS — C7951 Secondary malignant neoplasm of bone: Secondary | ICD-10-CM

## 2012-01-02 MED ORDER — CITALOPRAM HYDROBROMIDE 20 MG PO TABS: 20.0000 mg | ORAL_TABLET | Freq: Every day | ORAL | Status: DC

## 2012-01-05 ENCOUNTER — Other Ambulatory Visit: Payer: Self-pay | Admitting: Lab

## 2012-01-05 ENCOUNTER — Ambulatory Visit (HOSPITAL_BASED_OUTPATIENT_CLINIC_OR_DEPARTMENT_OTHER): Payer: Medicare Other

## 2012-01-05 ENCOUNTER — Ambulatory Visit: Payer: Self-pay

## 2012-01-05 ENCOUNTER — Other Ambulatory Visit (HOSPITAL_BASED_OUTPATIENT_CLINIC_OR_DEPARTMENT_OTHER): Payer: Medicare Other | Admitting: Lab

## 2012-01-05 ENCOUNTER — Other Ambulatory Visit: Payer: Self-pay | Admitting: Emergency Medicine

## 2012-01-05 VITALS — BP 147/65 | HR 63 | Temp 98.7°F

## 2012-01-05 DIAGNOSIS — C7952 Secondary malignant neoplasm of bone marrow: Secondary | ICD-10-CM

## 2012-01-05 DIAGNOSIS — K219 Gastro-esophageal reflux disease without esophagitis: Secondary | ICD-10-CM

## 2012-01-05 DIAGNOSIS — D649 Anemia, unspecified: Secondary | ICD-10-CM

## 2012-01-05 DIAGNOSIS — D630 Anemia in neoplastic disease: Secondary | ICD-10-CM

## 2012-01-05 DIAGNOSIS — C50919 Malignant neoplasm of unspecified site of unspecified female breast: Secondary | ICD-10-CM

## 2012-01-05 DIAGNOSIS — D510 Vitamin B12 deficiency anemia due to intrinsic factor deficiency: Secondary | ICD-10-CM

## 2012-01-05 LAB — CBC WITH DIFFERENTIAL/PLATELET
EOS%: 3.3 % (ref 0.0–7.0)
Eosinophils Absolute: 0.2 10*3/uL (ref 0.0–0.5)
MCHC: 31 g/dL — ABNORMAL LOW (ref 31.5–36.0)
MONO#: 0.7 10*3/uL (ref 0.1–0.9)
MONO%: 9.7 % (ref 0.0–14.0)
NEUT#: 4 10*3/uL (ref 1.5–6.5)
RBC: 3.33 10*6/uL — ABNORMAL LOW (ref 3.70–5.45)
WBC: 7 10*3/uL (ref 3.9–10.3)
lymph#: 2.1 10*3/uL (ref 0.9–3.3)

## 2012-01-05 MED ORDER — OXYCODONE-ACETAMINOPHEN 5-325 MG PO TABS: 1.0000 | ORAL_TABLET | Freq: Four times a day (QID) | ORAL | Status: DC | PRN

## 2012-01-05 MED ORDER — DARBEPOETIN ALFA-POLYSORBATE 200 MCG/0.4ML IJ SOLN
200.0000 ug | Freq: Once | INTRAMUSCULAR | Status: AC
  Administered 2012-01-05: 200 ug via SUBCUTANEOUS
  Filled 2012-01-05: qty 0.4

## 2012-01-05 MED ORDER — CYANOCOBALAMIN 1000 MCG/ML IJ SOLN
1000.0000 ug | Freq: Once | INTRAMUSCULAR | Status: AC
  Administered 2012-01-05: 1000 ug via INTRAMUSCULAR

## 2012-01-05 MED ORDER — SODIUM CHLORIDE 0.9 % IV SOLN
1020.0000 mg | Freq: Once | INTRAVENOUS | Status: AC
  Administered 2012-01-05: 1020 mg via INTRAVENOUS
  Filled 2012-01-05: qty 34

## 2012-01-05 NOTE — Patient Instructions (Addendum)
Ferumoxytol injection What is this medicine? FERUMOXYTOL is an iron complex. Iron is used to make healthy red blood cells, which carry oxygen and nutrients throughout the body. This medicine is used to treat iron deficiency anemia in people with chronic kidney disease. This medicine may be used for other purposes; ask your health care provider or pharmacist if you have questions. What should I tell my health care provider before I take this medicine? They need to know if you have any of these conditions: -anemia not caused by low iron levels -high levels of iron in the blood -magnetic resonance imaging (MRI) test scheduled -an unusual or allergic reaction to iron, other medicines, foods, dyes, or preservatives -pregnant or trying to get pregnant -breast-feeding How should I use this medicine? This medicine is for infusion into a vein. It is given by a health care professional in a hospital or clinic setting. Talk to your pediatrician regarding the use of this medicine in children. Special care may be needed. Overdosage: If you think you've taken too much of this medicine contact a poison control center or emergency room at once. Overdosage: If you think you have taken too much of this medicine contact a poison control center or emergency room at once. NOTE: This medicine is only for you. Do not share this medicine with others. What if I miss a dose? It is important not to miss your dose. Call your doctor or health care professional if you are unable to keep an appointment. What may interact with this medicine? This medicine may interact with the following medications: -other iron products This list may not describe all possible interactions. Give your health care provider a list of all the medicines, herbs, non-prescription drugs, or dietary supplements you use. Also tell them if you smoke, drink alcohol, or use illegal drugs. Some items may interact with your medicine. What should I watch  for while using this medicine? Visit your doctor or healthcare professional regularly. Tell your doctor or healthcare professional if your symptoms do not start to get better or if they get worse. You may need blood work done while you are taking this medicine. You may need to follow a special diet. Talk to your doctor. Foods that contain iron include: whole grains/cereals, dried fruits, beans, or peas, leafy green vegetables, and organ meats (liver, kidney). What side effects may I notice from receiving this medicine? Side effects that you should report to your doctor or health care professional as soon as possible: -allergic reactions like skin rash, itching or hives, swelling of the face, lips, or tongue -breathing problems -changes in blood pressure -feeling faint or lightheaded, falls -fever or chills -flushing, sweating, or hot feelings -swelling of the ankles or feet Side effects that usually do not require medical attention (Report these to your doctor or health care professional if they continue or are bothersome.): -diarrhea -headache -nausea, vomiting -stomach pain This list may not describe all possible side effects. Call your doctor for medical advice about side effects. You may report side effects to FDA at 1-800-FDA-1088. Where should I keep my medicine? This drug is given in a hospital or clinic and will not be stored at home. NOTE: This sheet is a summary. It may not cover all possible information. If you have questions about this medicine, talk to your doctor, pharmacist, or health care provider.  2013, Elsevier/Gold Standard. (10/09/2007 9:48:25 PM)  

## 2012-01-10 ENCOUNTER — Telehealth: Payer: Self-pay | Admitting: *Deleted

## 2012-01-10 NOTE — Telephone Encounter (Signed)
Discuss with patient, no samples available.

## 2012-01-10 NOTE — Telephone Encounter (Signed)
Pt is requesting samples of Lantus. .Please advise

## 2012-01-10 NOTE — Telephone Encounter (Signed)
Ok for either lantus or levemir if we have it

## 2012-01-12 ENCOUNTER — Other Ambulatory Visit: Payer: Self-pay | Admitting: Lab

## 2012-01-18 ENCOUNTER — Ambulatory Visit: Payer: Self-pay

## 2012-01-19 ENCOUNTER — Other Ambulatory Visit (HOSPITAL_BASED_OUTPATIENT_CLINIC_OR_DEPARTMENT_OTHER): Payer: Medicare Other | Admitting: Lab

## 2012-01-19 ENCOUNTER — Ambulatory Visit (HOSPITAL_BASED_OUTPATIENT_CLINIC_OR_DEPARTMENT_OTHER): Payer: Medicare Other

## 2012-01-19 VITALS — BP 158/60 | HR 90 | Temp 98.8°F

## 2012-01-19 DIAGNOSIS — D630 Anemia in neoplastic disease: Secondary | ICD-10-CM

## 2012-01-19 DIAGNOSIS — D649 Anemia, unspecified: Secondary | ICD-10-CM

## 2012-01-19 DIAGNOSIS — C50919 Malignant neoplasm of unspecified site of unspecified female breast: Secondary | ICD-10-CM

## 2012-01-19 DIAGNOSIS — K219 Gastro-esophageal reflux disease without esophagitis: Secondary | ICD-10-CM

## 2012-01-19 DIAGNOSIS — C7951 Secondary malignant neoplasm of bone: Secondary | ICD-10-CM

## 2012-01-19 DIAGNOSIS — E538 Deficiency of other specified B group vitamins: Secondary | ICD-10-CM

## 2012-01-19 LAB — COMPREHENSIVE METABOLIC PANEL (CC13)
AST: 12 U/L (ref 5–34)
Albumin: 3.1 g/dL — ABNORMAL LOW (ref 3.5–5.0)
BUN: 30 mg/dL — ABNORMAL HIGH (ref 7.0–26.0)
CO2: 27 mEq/L (ref 22–29)
Calcium: 10.1 mg/dL (ref 8.4–10.4)
Chloride: 103 mEq/L (ref 98–107)
Glucose: 126 mg/dl — ABNORMAL HIGH (ref 70–99)
Potassium: 4.8 mEq/L (ref 3.5–5.1)

## 2012-01-19 LAB — CBC WITH DIFFERENTIAL/PLATELET
Basophils Absolute: 0 10*3/uL (ref 0.0–0.1)
Eosinophils Absolute: 0.2 10*3/uL (ref 0.0–0.5)
HCT: 31.4 % — ABNORMAL LOW (ref 34.8–46.6)
HGB: 9.9 g/dL — ABNORMAL LOW (ref 11.6–15.9)
MONO#: 0.8 10*3/uL (ref 0.1–0.9)
NEUT%: 59 % (ref 38.4–76.8)
Platelets: 255 10*3/uL (ref 145–400)
WBC: 7.2 10*3/uL (ref 3.9–10.3)
lymph#: 1.9 10*3/uL (ref 0.9–3.3)

## 2012-01-19 MED ORDER — DARBEPOETIN ALFA-POLYSORBATE 200 MCG/0.4ML IJ SOLN
200.0000 ug | Freq: Once | INTRAMUSCULAR | Status: AC
  Administered 2012-01-19: 200 ug via SUBCUTANEOUS
  Filled 2012-01-19: qty 0.4

## 2012-01-25 ENCOUNTER — Ambulatory Visit (INDEPENDENT_AMBULATORY_CARE_PROVIDER_SITE_OTHER): Payer: Medicare Other | Admitting: Family Medicine

## 2012-01-25 ENCOUNTER — Encounter: Payer: Self-pay | Admitting: Family Medicine

## 2012-01-25 VITALS — BP 150/69 | HR 87 | Temp 99.4°F | Resp 18 | Wt 234.0 lb

## 2012-01-25 DIAGNOSIS — J209 Acute bronchitis, unspecified: Secondary | ICD-10-CM

## 2012-01-25 MED ORDER — PREDNISONE 20 MG PO TABS: ORAL_TABLET | ORAL | Status: DC

## 2012-01-25 MED ORDER — HYDROCODONE-HOMATROPINE 5-1.5 MG/5ML PO SYRP: ORAL_SOLUTION | ORAL | Status: DC

## 2012-01-25 MED ORDER — AZITHROMYCIN 250 MG PO TABS: ORAL_TABLET | ORAL | Status: DC

## 2012-01-25 NOTE — Assessment & Plan Note (Signed)
Complicated due to hx of recent stage IV breast cancer. Azithromycin x 5d. Prednisone 40mg  qd x 5d. Hycodan susp for hs cough med, #139ml, no RF.  Robitussin DM for daytime use.

## 2012-01-25 NOTE — Progress Notes (Signed)
OFFICE NOTE  01/25/2012  CC:  Chief Complaint  Patient presents with  . Cough    x 3 days  . Nasal Congestion  . Generalized Body Aches     HPI: Patient is a 71 y.o. Caucasian female who is here for resp complaints. Pt presents complaining of respiratory symptoms for 72 hours.  Primary symptoms are: cough, nasal congestion, runny nose, sneezing, achy in joints and muscles, +headache, throat burns when coughs.  Worst symptoms seems to be the cough and achiness.  Says +SOB, wheezing, tightness in chest.  Lately the symptoms seem to be worsening.  No fevers.   Symptoms made worse by nothing.  Symptoms improved by throat lozenges, drinking lots of fluids.   Smoker? None  Flu shot this season at least 2 wks ago? yes  Additional ROS: no n/v/d or abdominal pain.  No rash.  No neck stiffness.   +Mild fatigue.  +Mild appetite loss.   Pertinent PMH:  Past Medical History  Diagnosis Date  . Anemia   . Depression   . Diabetes mellitus type II   . GERD (gastroesophageal reflux disease)   . Hyperlipidemia   . Hypothyroidism   . Osteoarthritis   . Carotid artery stenosis     bilateral  . CAD (coronary artery disease)   . OSA (obstructive sleep apnea)   . Breast cancer     right breast cancer-invasive  ductal carcinoma (StageIV)  . Hypertension   . Anxiety   . Carotid artery occlusion     MEDS:  Outpatient Prescriptions Prior to Visit  Medication Sig Dispense Refill  . aspirin 81 MG chewable tablet Chew 81 mg by mouth daily.      Marland Kitchen atorvastatin (LIPITOR) 40 MG tablet Take 1 tablet (40 mg total) by mouth daily.  90 tablet  1  . bisacodyl (DULCOLAX) 5 MG EC tablet Take 5 mg by mouth daily as needed. For constipation      . calcium carbonate (OS-CAL) 600 MG TABS Take 600 mg by mouth 2 (two) times daily with a meal.        . carvedilol (COREG) 12.5 MG tablet Take 1 tablet (12.5 mg total) by mouth 2 (two) times daily with a meal.  60 tablet  11  . chlorhexidine (PERIDEX) 0.12 %  solution Rinse for 90  seconds  with 15 mLs three times daily. Spit out excess. Do NOT swallow.      . citalopram (CELEXA) 20 MG tablet Take 1 tablet (20 mg total) by mouth daily.  30 tablet  2  . clopidogrel (PLAVIX) 75 MG tablet Take 1 tablet (75 mg total) by mouth daily.  30 tablet  6  . cyanocobalamin (,VITAMIN B-12,) 1000 MCG/ML injection Inject 1,000 mcg into the muscle every 30 (thirty) days. Last dose 03/09/2011      . darbepoetin (ARANESP) 200 MCG/0.4ML SOLN Inject 200 mcg into the skin once. Every 2 weeks      . hydrochlorothiazide (MICROZIDE) 12.5 MG capsule Take 1 capsule (12.5 mg total) by mouth daily.  30 capsule  6  . ibuprofen (ADVIL) 200 MG tablet Take 200 mg by mouth 3 (three) times daily.        . insulin detemir (LEVEMIR) 100 UNIT/ML injection SAMPLES GIVEN [NO LANTUS AVAILABLE]  6 mL  0  . insulin glargine (LANTUS) 100 UNIT/ML injection Inject 60 Units into the skin at bedtime.  3 mL  0  . ketoconazole (NIZORAL) 2 % cream Apply topically daily. Apply to affected  area twice daily until cleare  15 g  3  . letrozole (FEMARA) 2.5 MG tablet Take 1 tablet (2.5 mg total) by mouth daily.  30 tablet  11  . levothyroxine (SYNTHROID) 200 MCG tablet Take 1 tablet (200 mcg total) by mouth daily.  30 tablet  11  . levothyroxine (SYNTHROID, LEVOTHROID) 75 MCG tablet Take 75 mcg by mouth daily.      Marland Kitchen LORazepam (ATIVAN) 0.5 MG tablet Take 1 tab by mouth twice a day as needed for anxiety  60 tablet  0  . losartan (COZAAR) 50 MG tablet Take 1 tablet (50 mg total) by mouth daily.  30 tablet  6  . metFORMIN (GLUCOPHAGE) 500 MG tablet Take 1 tablet (500 mg total) by mouth every morning.  30 tablet  5  . metoCLOPramide (REGLAN) 5 MG tablet Take 1 tablet (5 mg total) by mouth 4 (four) times daily -  before meals and at bedtime.  120 tablet  1  . oxyCODONE-acetaminophen (PERCOCET/ROXICET) 5-325 MG per tablet Take 1 tablet by mouth 4 (four) times daily as needed. For pain  120 tablet  0  .  dexlansoprazole (DEXILANT) 60 MG capsule Take 1 capsule (60 mg total) by mouth daily.  10 capsule  0  . fluconazole (DIFLUCAN) 100 MG tablet Take 100 mg by mouth daily.        Last reviewed on 01/25/2012  9:49 AM by Jeoffrey Massed, MD  PE: Blood pressure 150/69, pulse 87, temperature 99.4 F (37.4 C), temperature source Oral, resp. rate 18, weight 234 lb (106.142 kg), SpO2 93.00%. VS: noted--normal. Gen: alert, NAD, obese, NONTOXIC APPEARING. HEENT: eyes without injection, drainage, or swelling.  Ears: EACs clear, TMs with normal light reflex and landmarks.  Nose: Clear rhinorrhea, with some dried, crusty exudate adherent to mildly injected mucosa.  No purulent d/c.  No paranasal sinus TTP.  No facial swelling.  Throat and mouth without focal lesion.  No pharyngial swelling, erythema, or exudate.   Neck: supple, no LAD.   LUNGS: CTA bilat, nonlabored resps.  Coughed frequently during deep breathing.  CV: RRR, no m/r/g. EXT: no c/c/e SKIN: no rash  LAB: none  IMPRESSION AND PLAN:  Acute bronchitis Complicated due to hx of recent stage IV breast cancer. Azithromycin x 5d. Prednisone 40mg  qd x 5d. Hycodan susp for hs cough med, #16ml, no RF.  Robitussin DM for daytime use.   An After Visit Summary was printed and given to the patient.  FOLLOW UP:  prn

## 2012-01-26 ENCOUNTER — Other Ambulatory Visit: Payer: Self-pay | Admitting: Lab

## 2012-01-26 ENCOUNTER — Ambulatory Visit: Payer: Self-pay

## 2012-01-29 ENCOUNTER — Telehealth: Payer: Self-pay | Admitting: Internal Medicine

## 2012-01-29 MED ORDER — INSULIN GLARGINE 100 UNIT/ML ~~LOC~~ SOLN: 60.0000 [IU] | Freq: Every day | SUBCUTANEOUS | Status: DC

## 2012-01-29 NOTE — Telephone Encounter (Signed)
Verified w/pharmacy last Rx 12.02.13 valid on HCTZ #30x6--Denied/SLS Sent Rx for Lantus at pt's request/SLS

## 2012-01-29 NOTE — Telephone Encounter (Signed)
Refill-hydrochlorothiazide 12.5mg cap. Take one capsule by mouth every day. Qty 30 last fill 12.2.13 °

## 2012-02-02 ENCOUNTER — Other Ambulatory Visit: Payer: Self-pay | Admitting: Lab

## 2012-02-02 ENCOUNTER — Ambulatory Visit: Payer: Self-pay

## 2012-02-06 ENCOUNTER — Telehealth: Payer: Self-pay | Admitting: Internal Medicine

## 2012-02-06 NOTE — Telephone Encounter (Signed)
Refill-hydrochlorothiazide 12.5mg  cap. Take one capsule by mouth every day. Qty 30 last fill 12.2.13

## 2012-02-06 NOTE — Telephone Encounter (Signed)
HCTZ request, Last Rx 12.02.13 #30x6; LMOM to verify with pharmacy refills available, too soon for new request/SLS

## 2012-02-08 ENCOUNTER — Encounter: Payer: Self-pay | Admitting: Internal Medicine

## 2012-02-08 ENCOUNTER — Ambulatory Visit (INDEPENDENT_AMBULATORY_CARE_PROVIDER_SITE_OTHER): Payer: Medicare Other | Admitting: Internal Medicine

## 2012-02-08 ENCOUNTER — Ambulatory Visit (HOSPITAL_BASED_OUTPATIENT_CLINIC_OR_DEPARTMENT_OTHER)
Admission: RE | Admit: 2012-02-08 | Discharge: 2012-02-08 | Disposition: A | Discharge status: Home / Self Care | Payer: Medicare Other | Source: Ambulatory Visit | Attending: Internal Medicine | Admitting: Internal Medicine

## 2012-02-08 VITALS — BP 106/58 | HR 64 | Temp 98.2°F | Resp 16 | Wt 229.1 lb

## 2012-02-08 DIAGNOSIS — R05 Cough: Secondary | ICD-10-CM

## 2012-02-08 DIAGNOSIS — J209 Acute bronchitis, unspecified: Secondary | ICD-10-CM

## 2012-02-08 DIAGNOSIS — R059 Cough, unspecified: Secondary | ICD-10-CM | POA: Insufficient documentation

## 2012-02-08 MED ORDER — AMOXICILLIN-POT CLAVULANATE 875-125 MG PO TABS: 1.0000 | ORAL_TABLET | Freq: Two times a day (BID) | ORAL | Status: AC

## 2012-02-08 NOTE — Assessment & Plan Note (Signed)
Begin 7d course of augmentin. Obtain cxr. Followup if no improvement or worsening. Also schedule follow up appt for her chronic medical problems

## 2012-02-08 NOTE — Progress Notes (Signed)
  Subjective:    Patient ID: Brittney Cunningham, female    DOB: [birth year], 72 y.o.   MRN: [id number]  HPI Pt presents to clinic for evaluation of cough. Seen 12/26 by Dr. Marvel Plan for bronchitis with wheezing. Completed course of zithromax and prednisone. Sx's improved but has persistent cough without wheezing. This am noted a very minimal streak of blood in her sputum on one occasion-denies gross active bleeding. No alleviating or exacerbating factors.  Past Medical History  Diagnosis Date  . Anemia   . Depression   . Diabetes mellitus type II   . GERD (gastroesophageal reflux disease)   . Hyperlipidemia   . Hypothyroidism   . Osteoarthritis   . Carotid artery stenosis     bilateral  . CAD (coronary artery disease)   . OSA (obstructive sleep apnea)   . Breast cancer     right breast cancer-invasive  ductal carcinoma (StageIV)  . Hypertension   . Anxiety   . Carotid artery occlusion    Past Surgical History  Procedure Date  . Incisional breast biopsy     remoted left breast biopsy  . Cesarean section   . Other surgical history     GYN surgery  . Knee surgery     right knee surgery  . Carotid endarterectomy     right carotid  . Port-a-cath removal   . Modified mastectomy     Right breast  . Breast surgery   . Mastectomy   . Carotid stent     left    reports that she quit smoking about 24 years ago. Her smoking use included Cigarettes. She has a 20 pack-year smoking history. She has never used smokeless tobacco. She reports that she does not drink alcohol or use illicit drugs. family history includes Arthritis in an unspecified family member; Coronary artery disease in an unspecified family member; Diabetes in her mother; Heart disease in her mother; Hypertension in her mother; and Stroke in an unspecified family member.  There is no history of Colon cancer and Stomach cancer. Allergies  Allergen Reactions  . Chlorhexidine Hives, Itching and Rash    This was most likely a  CONTACT DERMATITIS versus true systemic allergic reaction  . Adhesive (Tape) Hives  . Codeine Swelling     Review of Systems see hpi     Objective:   Physical Exam  Nursing note and vitals reviewed. Constitutional: She appears well-developed and well-nourished. No distress.  HENT:  Head: Normocephalic and atraumatic.  Right Ear: External ear normal.  Left Ear: External ear normal.  Nose: Nose normal.  Mouth/Throat: Oropharynx is clear and moist. No oropharyngeal exudate.  Eyes: No scleral icterus.  Neck: Neck supple.  Pulmonary/Chest: Effort normal and breath sounds normal. No respiratory distress. She has no wheezes. She has no rales.  Neurological: She is alert.  Skin: Skin is warm and dry. She is not diaphoretic.  Psychiatric: She has a normal mood and affect.          Assessment & Plan:

## 2012-02-16 ENCOUNTER — Other Ambulatory Visit: Payer: Medicare Other | Admitting: Lab

## 2012-02-16 ENCOUNTER — Other Ambulatory Visit: Payer: Self-pay | Admitting: Oncology

## 2012-02-16 ENCOUNTER — Other Ambulatory Visit: Payer: Self-pay | Admitting: *Deleted

## 2012-02-16 ENCOUNTER — Ambulatory Visit (HOSPITAL_BASED_OUTPATIENT_CLINIC_OR_DEPARTMENT_OTHER): Payer: Medicare Other

## 2012-02-16 VITALS — BP 144/45 | HR 74 | Temp 97.9°F

## 2012-02-16 DIAGNOSIS — D630 Anemia in neoplastic disease: Secondary | ICD-10-CM

## 2012-02-16 DIAGNOSIS — D649 Anemia, unspecified: Secondary | ICD-10-CM

## 2012-02-16 DIAGNOSIS — K219 Gastro-esophageal reflux disease without esophagitis: Secondary | ICD-10-CM

## 2012-02-16 DIAGNOSIS — C50919 Malignant neoplasm of unspecified site of unspecified female breast: Secondary | ICD-10-CM

## 2012-02-16 LAB — COMPREHENSIVE METABOLIC PANEL (CC13)
Alkaline Phosphatase: 93 U/L (ref 40–150)
BUN: 32 mg/dL — ABNORMAL HIGH (ref 7.0–26.0)
Glucose: 132 mg/dl — ABNORMAL HIGH (ref 70–99)
Total Bilirubin: 0.3 mg/dL (ref 0.20–1.20)

## 2012-02-16 LAB — CBC WITH DIFFERENTIAL/PLATELET
Basophils Absolute: 0.1 10*3/uL (ref 0.0–0.1)
EOS%: 3.9 % (ref 0.0–7.0)
HCT: 30.4 % — ABNORMAL LOW (ref 34.8–46.6)
HGB: 10.1 g/dL — ABNORMAL LOW (ref 11.6–15.9)
MCH: 28.5 pg (ref 25.1–34.0)
MCV: 85.6 fL (ref 79.5–101.0)
MONO%: 8 % (ref 0.0–14.0)
NEUT%: 57.5 % (ref 38.4–76.8)

## 2012-02-16 MED ORDER — SODIUM CHLORIDE 0.9 % IJ SOLN
10.0000 mL | INTRAMUSCULAR | Status: DC | PRN
  Administered 2012-02-16: 10 mL via INTRAVENOUS
  Filled 2012-02-16: qty 10

## 2012-02-16 MED ORDER — OXYCODONE-ACETAMINOPHEN 5-325 MG PO TABS: 1.0000 | ORAL_TABLET | Freq: Four times a day (QID) | ORAL | Status: DC | PRN

## 2012-02-16 MED ORDER — HEPARIN SOD (PORK) LOCK FLUSH 100 UNIT/ML IV SOLN
500.0000 [IU] | Freq: Once | INTRAVENOUS | Status: AC
  Administered 2012-02-16: 500 [IU] via INTRAVENOUS
  Filled 2012-02-16: qty 5

## 2012-02-16 MED ORDER — CYANOCOBALAMIN 1000 MCG/ML IJ SOLN
1000.0000 ug | Freq: Once | INTRAMUSCULAR | Status: AC
  Administered 2012-02-16: 1000 ug via INTRAMUSCULAR

## 2012-02-16 MED ORDER — DARBEPOETIN ALFA-POLYSORBATE 200 MCG/0.4ML IJ SOLN
200.0000 ug | Freq: Once | INTRAMUSCULAR | Status: AC
  Administered 2012-02-16: 200 ug via SUBCUTANEOUS
  Filled 2012-02-16: qty 0.4

## 2012-02-29 ENCOUNTER — Telehealth: Payer: Self-pay | Admitting: Internal Medicine

## 2012-02-29 MED ORDER — INSULIN GLARGINE 100 UNIT/ML ~~LOC~~ SOLN: 60.0000 [IU] | Freq: Every day | SUBCUTANEOUS | Status: DC

## 2012-02-29 NOTE — Telephone Encounter (Signed)
Refill lantus k1000/ml inj nl #40 inject 45 units into the skin at bedtime last fill 01-09-2012

## 2012-02-29 NOTE — Telephone Encounter (Signed)
Pt states she wants the vials of Lantus to be sent in because the pens are to hard to push and shes bruising her stomach

## 2012-03-01 ENCOUNTER — Ambulatory Visit (HOSPITAL_BASED_OUTPATIENT_CLINIC_OR_DEPARTMENT_OTHER): Payer: Medicare Other

## 2012-03-01 ENCOUNTER — Other Ambulatory Visit (HOSPITAL_BASED_OUTPATIENT_CLINIC_OR_DEPARTMENT_OTHER): Payer: Medicare Other | Admitting: Lab

## 2012-03-01 ENCOUNTER — Ambulatory Visit: Payer: Self-pay

## 2012-03-01 VITALS — BP 167/76 | HR 67 | Temp 97.7°F | Resp 20

## 2012-03-01 DIAGNOSIS — C50919 Malignant neoplasm of unspecified site of unspecified female breast: Secondary | ICD-10-CM

## 2012-03-01 DIAGNOSIS — D649 Anemia, unspecified: Secondary | ICD-10-CM

## 2012-03-01 DIAGNOSIS — D630 Anemia in neoplastic disease: Secondary | ICD-10-CM

## 2012-03-01 DIAGNOSIS — K219 Gastro-esophageal reflux disease without esophagitis: Secondary | ICD-10-CM

## 2012-03-01 LAB — CBC WITH DIFFERENTIAL/PLATELET
Eosinophils Absolute: 0.2 10*3/uL (ref 0.0–0.5)
HCT: 33.3 % — ABNORMAL LOW (ref 34.8–46.6)
HGB: 10.6 g/dL — ABNORMAL LOW (ref 11.6–15.9)
LYMPH%: 28 % (ref 14.0–49.7)
MONO#: 0.4 10*3/uL (ref 0.1–0.9)
NEUT#: 3.4 10*3/uL (ref 1.5–6.5)
NEUT%: 60.1 % (ref 38.4–76.8)
Platelets: 221 10*3/uL (ref 145–400)
WBC: 5.7 10*3/uL (ref 3.9–10.3)

## 2012-03-01 LAB — COMPREHENSIVE METABOLIC PANEL (CC13)
ALT: 8 U/L (ref 0–55)
Albumin: 3 g/dL — ABNORMAL LOW (ref 3.5–5.0)
BUN: 33 mg/dL — ABNORMAL HIGH (ref 7.0–26.0)
CO2: 23 mEq/L (ref 22–29)
Calcium: 9.6 mg/dL (ref 8.4–10.4)
Chloride: 105 mEq/L (ref 98–107)
Creatinine: 1.1 mg/dL (ref 0.6–1.1)

## 2012-03-01 MED ORDER — DARBEPOETIN ALFA-POLYSORBATE 200 MCG/0.4ML IJ SOLN
200.0000 ug | Freq: Once | INTRAMUSCULAR | Status: AC
  Administered 2012-03-01: 200 ug via SUBCUTANEOUS
  Filled 2012-03-01: qty 0.4

## 2012-03-05 ENCOUNTER — Telehealth: Payer: Self-pay | Admitting: Oncology

## 2012-03-11 ENCOUNTER — Encounter: Payer: Self-pay | Admitting: Family Medicine

## 2012-03-11 ENCOUNTER — Telehealth: Payer: Self-pay | Admitting: Internal Medicine

## 2012-03-11 ENCOUNTER — Ambulatory Visit (INDEPENDENT_AMBULATORY_CARE_PROVIDER_SITE_OTHER): Payer: Medicare Other | Admitting: Family Medicine

## 2012-03-11 VITALS — BP 154/60 | HR 70 | Temp 97.5°F | Ht 63.0 in | Wt 238.0 lb

## 2012-03-11 DIAGNOSIS — K5900 Constipation, unspecified: Secondary | ICD-10-CM

## 2012-03-11 DIAGNOSIS — J209 Acute bronchitis, unspecified: Secondary | ICD-10-CM

## 2012-03-11 DIAGNOSIS — I10 Essential (primary) hypertension: Secondary | ICD-10-CM

## 2012-03-11 DIAGNOSIS — K219 Gastro-esophageal reflux disease without esophagitis: Secondary | ICD-10-CM

## 2012-03-11 DIAGNOSIS — E119 Type 2 diabetes mellitus without complications: Secondary | ICD-10-CM

## 2012-03-11 MED ORDER — METFORMIN HCL 500 MG PO TABS: 500.0000 mg | ORAL_TABLET | Freq: Every morning | ORAL | Status: DC

## 2012-03-11 MED ORDER — SENNA-DOCUSATE SODIUM 8.6-50 MG PO TABS: ORAL_TABLET | ORAL | Status: DC

## 2012-03-11 MED ORDER — PANTOPRAZOLE SODIUM 40 MG PO TBEC: 40.0000 mg | DELAYED_RELEASE_TABLET | Freq: Every day | ORAL | Status: DC

## 2012-03-11 MED ORDER — RANITIDINE HCL 150 MG PO TABS: 150.0000 mg | ORAL_TABLET | Freq: Two times a day (BID) | ORAL | Status: DC

## 2012-03-11 MED ORDER — INSULIN GLARGINE 100 UNIT/ML ~~LOC~~ SOLN: 60.0000 [IU] | Freq: Every day | SUBCUTANEOUS | Status: DC

## 2012-03-11 NOTE — Telephone Encounter (Signed)
RX sent

## 2012-03-11 NOTE — Patient Instructions (Addendum)
Labs prior to next visit, lipid, renal, cbc, tsh, hepatic, hgba1c, H Pylori (dyspepsia)   Start a probiotic such as Align, Digestive Advantage or a generic  Pick a fiber supplement such as Metamucil or Benefiber with water

## 2012-03-11 NOTE — Telephone Encounter (Signed)
Refill- metformin hcl 500mg  tab. Take one tablet by mouth every morning. Qty 30 last fill 1.7.14

## 2012-03-12 ENCOUNTER — Encounter: Payer: Self-pay | Admitting: Family Medicine

## 2012-03-12 ENCOUNTER — Other Ambulatory Visit: Payer: Self-pay | Admitting: *Deleted

## 2012-03-12 DIAGNOSIS — C7951 Secondary malignant neoplasm of bone: Secondary | ICD-10-CM

## 2012-03-12 MED ORDER — CITALOPRAM HYDROBROMIDE 20 MG PO TABS: 20.0000 mg | ORAL_TABLET | Freq: Every day | ORAL | Status: DC

## 2012-03-12 NOTE — Addendum Note (Signed)
Addended by: Elita Quick E on: 03/12/2012 11:33 AM   Modules accepted: Orders

## 2012-03-15 ENCOUNTER — Encounter: Payer: Self-pay | Admitting: Family Medicine

## 2012-03-15 ENCOUNTER — Ambulatory Visit: Payer: Self-pay

## 2012-03-15 DIAGNOSIS — K5900 Constipation, unspecified: Secondary | ICD-10-CM | POA: Insufficient documentation

## 2012-03-15 HISTORY — DX: Constipation, unspecified: K59.00

## 2012-03-15 NOTE — Assessment & Plan Note (Signed)
Discussed diet and need for healthy eating at length. Frequently skips meals and then eats meals. Encouraged small, frequent meals with complex carbs, lowfat proteins and healthy fats. Given samples of Lantus

## 2012-03-15 NOTE — Assessment & Plan Note (Signed)
Mild elevation but has not taken all of her meds today will reassess at next visit.

## 2012-03-15 NOTE — Assessment & Plan Note (Signed)
Improved, encouraged Mucinex bid, increased rest and fluids, report if symptoms worsen again

## 2012-03-15 NOTE — Progress Notes (Signed)
Patient ID: Brittney Cunningham, female   DOB: 01/29/1941, 72 y.o.   MRN: [id number] [facility] [id number] 08-27-1940 03/15/2012      Progress Note-Follow Up  Subjective  Chief Complaint  Chief Complaint  Patient presents with  . Follow-up    1 month    HPI  Patient is a 72 year old female who is in today for followup. She was seen last month with bronchitis. Symptoms are greatly improved she does have a mild cough and occasional hoarseness in the evenings. She struggles with fatigue and a sense of postnasal drip recently as well. No chest pain or palpitations. No fevers or chills. No ear pain. Notes her blood sugars have been running high as well. In the 200 range. She's had some low-grade constipation moving her bowels with some difficulty retrieving 5 days. No bloody or tarry stool. 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  Past Medical History  Diagnosis Date  . Anemia   . Depression   . Diabetes mellitus type II   . GERD (gastroesophageal reflux disease)   . Hyperlipidemia   . Hypothyroidism   . Osteoarthritis   . Carotid artery stenosis     bilateral  . CAD (coronary artery disease)   . OSA (obstructive sleep apnea)   . Breast cancer     right breast cancer-invasive  ductal carcinoma (StageIV)  . Hypertension   . Anxiety   . Carotid artery occlusion   . Unspecified constipation 03/15/2012    Past Surgical History  Procedure Laterality Date  . Incisional breast biopsy      remoted left breast biopsy  . Cesarean section    . Other surgical history      GYN surgery  . Knee surgery      right knee surgery  . Carotid endarterectomy      right carotid  . Port-a-cath removal    . Modified mastectomy      Right breast  . Breast surgery    . Mastectomy    . Carotid stent      left    Family History  Problem Relation Age of Onset  . Arthritis    . Coronary artery disease      first degree relative  . Stroke     first degree relative  . Diabetes Mother   . Heart disease Mother   . Hypertension Mother   . Colon cancer Neg Hx   . Stomach cancer Neg Hx     History   Social History  . Marital Status: Married    Spouse Name: N/A    Number of Children: 3  . Years of Education: N/A   Occupational History  . 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 Smoking status: Former Smoker -- 1.00 packs/day for 20 years    Types: Cigarettes    Quit date: 01/31/1988  . Smokeless tobacco: Never Used  . Alcohol Use: No  . Drug Use: No  . Sexually Active: Not on file   Other Topics Concern  . Not on file   Social History Narrative   Retired-ran a daycare facility for 40 years.   Married- 43 years    2 sons    1 daughter - Engineer, civil (consulting) midwife   Former smoker-quit in [place].     Current Outpatient Prescriptions on File Prior to Visit  Medication Sig Dispense Refill  . aspirin 81  MG chewable tablet Chew 81 mg by mouth daily.      Marland Kitchen atorvastatin (LIPITOR) 40 MG tablet Take 1 tablet (40 mg total) by mouth daily.  90 tablet  1  . bisacodyl (DULCOLAX) 5 MG EC tablet Take 5 mg by mouth daily as needed. For constipation      . carvedilol (COREG) 12.5 MG tablet Take 1 tablet (12.5 mg total) by mouth 2 (two) times daily with a meal.  60 tablet  11  . chlorhexidine (PERIDEX) 0.12 % solution Rinse for 90  seconds  with 15 mLs three times daily. Spit out excess. Do NOT swallow.      . clopidogrel (PLAVIX) 75 MG tablet Take 1 tablet (75 mg total) by mouth daily.  30 tablet  6  . cyanocobalamin (,VITAMIN B-12,) 1000 MCG/ML injection Inject 1,000 mcg into the muscle every 30 (thirty) days. Last dose 03/09/2011      . darbepoetin (ARANESP) 200 MCG/0.4ML SOLN Inject 200 mcg into the skin once. Every 2 weeks      . hydrochlorothiazide (MICROZIDE) 12.5 MG capsule Take 1 capsule (12.5 mg total) by mouth daily.  30 capsule  6  . ibuprofen (ADVIL) 200 MG tablet Take 200 mg by mouth 3 (three) times daily.         Marland Kitchen letrozole (FEMARA) 2.5 MG tablet Take 1 tablet (2.5 mg total) by mouth daily.  30 tablet  11  . levothyroxine (SYNTHROID) 200 MCG tablet Take 1 tablet (200 mcg total) by mouth daily.  30 tablet  11  . levothyroxine (SYNTHROID, LEVOTHROID) 75 MCG tablet Take 75 mcg by mouth daily.      Marland Kitchen losartan (COZAAR) 50 MG tablet Take 1 tablet (50 mg total) by mouth daily.  30 tablet  6  . metoCLOPramide (REGLAN) 5 MG tablet Take 1 tablet (5 mg total) by mouth 4 (four) times daily -  before meals and at bedtime.  120 tablet  1  . oxyCODONE-acetaminophen (PERCOCET/ROXICET) 5-325 MG per tablet Take 1 tablet by mouth 4 (four) times daily as needed. For pain  120 tablet  0  . [DISCONTINUED] simvastatin (ZOCOR) 40 MG tablet Take 40 mg by mouth every evening.       No current facility-administered medications on file prior to visit.    Allergies  Allergen Reactions  . Chlorhexidine Hives, Itching and Rash    This was most likely a CONTACT DERMATITIS versus true systemic allergic reaction  . Adhesive (Tape) Hives  . Codeine Swelling    Review of Systems  Review of Systems  Constitutional: Positive for malaise/fatigue. Negative for fever.  HENT: Positive for congestion.   Eyes: Negative for pain and discharge.  Respiratory: Positive for cough. Negative for shortness of breath.   Cardiovascular: Negative for chest pain, palpitations and leg swelling.  Gastrointestinal: Negative for nausea, abdominal pain and diarrhea.  Genitourinary: Negative for dysuria.  Musculoskeletal: Negative for falls.  Skin: Negative for rash.  Neurological: Negative for loss of consciousness and headaches.  Endo/Heme/Allergies: Negative for polydipsia.  Psychiatric/Behavioral: Negative for depression and suicidal ideas. The patient is not nervous/anxious and does not have insomnia.     Objective  BP 154/60  Pulse 70  Temp(Src) 97.5 F (36.4 C) (Oral)  Ht 5\' 3"  (1.6 m)  Wt 238 lb (107.956 kg)  BMI 42.17 kg/m2   SpO2 93%  Physical Exam  Physical Exam  Constitutional: She is oriented to person, place, and time and well-developed, well-nourished, and in no distress. No distress.  HENT:  Head: Normocephalic and atraumatic.  Eyes: Conjunctivae are normal.  Neck: Neck supple. No thyromegaly present.  Cardiovascular: Normal rate, regular rhythm and normal heart sounds.   No murmur heard. Pulmonary/Chest: Effort normal and breath sounds normal. She has no wheezes.  Abdominal: She exhibits no distension and no mass.  Musculoskeletal: She exhibits no edema.  Lymphadenopathy:    She has no cervical adenopathy.  Neurological: She is alert and oriented to person, place, and time.  Skin: Skin is warm and dry. No rash noted. She is not diaphoretic.  Psychiatric: Memory, affect and judgment normal.    Lab Results  Component Value Date   TSH 0.06* 09/12/2011   Lab Results  Component Value Date   WBC 5.7 03/01/2012   HGB 10.6* 03/01/2012   HCT 33.3* 03/01/2012   MCV 87.4 03/01/2012   PLT 221 03/01/2012   Lab Results  Component Value Date   CREATININE 1.1 03/01/2012   BUN 33.0* 03/01/2012   NA 138 03/01/2012   K 4.5 03/01/2012   CL 105 03/01/2012   CO2 23 03/01/2012   Lab Results  Component Value Date   ALT 8 03/01/2012   AST 12 03/01/2012   ALKPHOS 92 03/01/2012   BILITOT 0.34 03/01/2012   Lab Results  Component Value Date   CHOL 160 05/04/2011   Lab Results  Component Value Date   HDL 27* 05/04/2011   Lab Results  Component Value Date   LDLCALC 103* 05/04/2011   Lab Results  Component Value Date   TRIG 151* 05/04/2011   Lab Results  Component Value Date   CHOLHDL 5.9 05/04/2011     Assessment & Plan  Acute bronchitis Improved, encouraged Mucinex bid, increased rest and fluids, report if symptoms worsen again  Unspecified constipation Encouraged increased hydration, fiber and exercise. Add probiotics and stool softener. Report if no resolution  DIABETES MELLITUS, TYPE II Discussed diet  and need for healthy eating at length. Frequently skips meals and then eats meals. Encouraged small, frequent meals with complex carbs, lowfat proteins and healthy fats. Given samples of Lantus  HYPERTENSION Mild elevation but has not taken all of her meds today will reassess at next visit.

## 2012-03-15 NOTE — Assessment & Plan Note (Signed)
Encouraged increased hydration, fiber and exercise. Add probiotics and stool softener. Report if no resolution

## 2012-03-18 ENCOUNTER — Ambulatory Visit (HOSPITAL_BASED_OUTPATIENT_CLINIC_OR_DEPARTMENT_OTHER): Payer: Medicare Other

## 2012-03-18 ENCOUNTER — Other Ambulatory Visit (HOSPITAL_BASED_OUTPATIENT_CLINIC_OR_DEPARTMENT_OTHER): Payer: Medicare Other | Admitting: Lab

## 2012-03-18 VITALS — BP 135/58 | HR 60 | Temp 98.3°F

## 2012-03-18 DIAGNOSIS — K219 Gastro-esophageal reflux disease without esophagitis: Secondary | ICD-10-CM

## 2012-03-18 DIAGNOSIS — C50919 Malignant neoplasm of unspecified site of unspecified female breast: Secondary | ICD-10-CM

## 2012-03-18 DIAGNOSIS — D649 Anemia, unspecified: Secondary | ICD-10-CM

## 2012-03-18 DIAGNOSIS — C7952 Secondary malignant neoplasm of bone marrow: Secondary | ICD-10-CM

## 2012-03-18 DIAGNOSIS — E538 Deficiency of other specified B group vitamins: Secondary | ICD-10-CM

## 2012-03-18 LAB — CBC WITH DIFFERENTIAL/PLATELET
Basophils Absolute: 0.1 10*3/uL (ref 0.0–0.1)
Eosinophils Absolute: 0.2 10*3/uL (ref 0.0–0.5)
HGB: 11.1 g/dL — ABNORMAL LOW (ref 11.6–15.9)
MCV: 87.6 fL (ref 79.5–101.0)
MONO#: 0.6 10*3/uL (ref 0.1–0.9)
MONO%: 8.4 % (ref 0.0–14.0)
NEUT#: 4 10*3/uL (ref 1.5–6.5)
Platelets: 235 10*3/uL (ref 145–400)
RDW: 19.1 % — ABNORMAL HIGH (ref 11.2–14.5)
WBC: 6.9 10*3/uL (ref 3.9–10.3)

## 2012-03-18 MED ORDER — DARBEPOETIN ALFA-POLYSORBATE 200 MCG/0.4ML IJ SOLN: 200.0000 ug | Freq: Once | INTRAMUSCULAR | Status: DC

## 2012-03-18 MED ORDER — CYANOCOBALAMIN 1000 MCG/ML IJ SOLN
1000.0000 ug | Freq: Once | INTRAMUSCULAR | Status: AC
  Administered 2012-03-18: 1000 ug via INTRAMUSCULAR

## 2012-03-19 ENCOUNTER — Other Ambulatory Visit: Payer: Self-pay | Admitting: *Deleted

## 2012-03-19 DIAGNOSIS — C50919 Malignant neoplasm of unspecified site of unspecified female breast: Secondary | ICD-10-CM

## 2012-03-19 MED ORDER — OXYCODONE-ACETAMINOPHEN 5-325 MG PO TABS: 1.0000 | ORAL_TABLET | Freq: Four times a day (QID) | ORAL | Status: DC | PRN

## 2012-03-25 ENCOUNTER — Telehealth: Payer: Self-pay | Admitting: Oncology

## 2012-03-25 ENCOUNTER — Ambulatory Visit (HOSPITAL_BASED_OUTPATIENT_CLINIC_OR_DEPARTMENT_OTHER): Payer: Medicare Other | Admitting: Physician Assistant

## 2012-03-25 ENCOUNTER — Encounter: Payer: Self-pay | Admitting: Physician Assistant

## 2012-03-25 ENCOUNTER — Other Ambulatory Visit (HOSPITAL_BASED_OUTPATIENT_CLINIC_OR_DEPARTMENT_OTHER): Payer: Medicare Other | Admitting: Lab

## 2012-03-25 VITALS — BP 155/64 | HR 75 | Temp 98.1°F | Resp 20 | Ht 63.0 in | Wt 236.4 lb

## 2012-03-25 DIAGNOSIS — C7951 Secondary malignant neoplasm of bone: Secondary | ICD-10-CM

## 2012-03-25 DIAGNOSIS — C50619 Malignant neoplasm of axillary tail of unspecified female breast: Secondary | ICD-10-CM

## 2012-03-25 DIAGNOSIS — Z853 Personal history of malignant neoplasm of breast: Secondary | ICD-10-CM

## 2012-03-25 LAB — COMPREHENSIVE METABOLIC PANEL (CC13)
ALT: 13 U/L (ref 0–55)
CO2: 25 mEq/L (ref 22–29)
Creatinine: 1.1 mg/dL (ref 0.6–1.1)
Total Bilirubin: 0.33 mg/dL (ref 0.20–1.20)

## 2012-03-25 LAB — FERRITIN: Ferritin: 298 ng/mL — ABNORMAL HIGH (ref 10–291)

## 2012-03-25 NOTE — Telephone Encounter (Signed)
, °

## 2012-03-25 NOTE — Progress Notes (Signed)
ID: Brittney Cunningham   DOB: [birth year]  MR#: [id number]  ION#:[id number]  PCP: Letitia Libra, Ala Dach, MD GYN: SU: Emelia Loron OTHER MD: Cindra Eves, Dorothy Puffer  HISTORY OF PRESENT ILLNESS: [name] was diagnosed with right breast carcinoma in May of 2011, a biopsy showing a grade 2 invasive ductal carcinoma, T2 NXM1, stage IV at presentation. There was bone only involvement. Tumor was strongly ER and PR positive, HER-2/neu negative, with an MIP-1 of 26%.  She was treated neoadjuvantly with letrozole and zoledronic acid beginning in June of 2011. She is status post right modified radical mastectomy in February 2012 for a ypT2 ypN2, grade 1 invasive ductal carcinoma. Margins were negative. Her subsequent history is as detailed below  INTERVAL HISTORY: Brittney Cunningham returns today for followup of her metastatic breast carcinoma. She continues on letrozole which she is tolerating well. She has also been receiving Aranesp injections every 2 weeks in addition to monthly B12 for her symptomatic anemia. She received both of these injections one week ago on 03/18/2012.  Interval history is remarkable for Brittney Cunningham having been diagnosed with bronchitis in December. Fortunately, this has resolved, and she is feeling much better.  REVIEW OF SYSTEMS: Brittney Cunningham denies any fevers or chills. She has noticed some increased hot flashes over the last few weeks.  She denies any skin changes and has had no abnormal bleeding. She has a slight residual cough, nonproductive, status post recent bronchitis. She denies any increased shortness of breath, but continues to have some shortness of breath with exertion associated with both deconditioning and anemia. She's had no chest pain or palpitations. She's eating and drinking fairly well denies any nausea or change in bowel habits. She has had some reflux and is now on those pantoprazole and ranitidine. Brittney Cunningham denies any abnormal headaches, dizziness, or change in vision. She continues to  have joint pain which limits her activities. The joint pain seems to be unassociated with the letrozole. She's had no significant peripheral swelling. She feels like her anxiety and depression is much better, and she would like to try tapering off of the citalopram.  A detailed review of systems is otherwise stable and noncontributory.  PAST MEDICAL HISTORY: Past Medical History  Diagnosis Date  . Anemia   . Depression   . Diabetes mellitus type II   . GERD (gastroesophageal reflux disease)   . Hyperlipidemia   . Hypothyroidism   . Osteoarthritis   . Carotid artery stenosis     bilateral  . CAD (coronary artery disease)   . OSA (obstructive sleep apnea)   . Breast cancer     right breast cancer-invasive  ductal carcinoma (StageIV)  . Hypertension   . Anxiety   . Carotid artery occlusion   . Unspecified constipation 03/15/2012    PAST SURGICAL HISTORY: Past Surgical History  Procedure Laterality Date  . Incisional breast biopsy      remoted left breast biopsy  . Cesarean section    . Other surgical history      GYN surgery  . Knee surgery      right knee surgery  . Carotid endarterectomy      right carotid  . Port-a-cath removal    . Modified mastectomy      Right breast  . Breast surgery    . Mastectomy    . Carotid stent      left    FAMILY HISTORY Family History  Problem Relation Age of Onset  . Arthritis    .  Coronary artery disease      first degree relative  . Stroke      first degree relative  . Diabetes Mother   . Heart disease Mother   . Hypertension Mother   . Colon cancer Neg Hx   . Stomach cancer Neg Hx   The patient's father died from pancreatic cancer at the age of 49. The patient's mother died from complications of diabetes and heart disease at the age of 70. The patient has 2 sisters and 2 brothers. There is no history of breast cancer or ovarian cancer in the immediate family. One of the patient's paternal aunts out of 6 paternal aunts had  breast cancer diagnosed in her 78s.    GYNECOLOGIC HISTORY: The patient is GX, P3. First pregnancy to term age 72. She went through the change of life in her late 70s. She never took hormones.   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 Her husband,, [name], is disabled secondary to a fall. He has difficulty with walking. He used to work for [employer] previously. The patient's Cunningham, Brittney Cunningham,  lives in [place], and works for [employer]. Daughter, Brittney Cunningham, lives in [place], and works for [employer]. Daughter, Lyla Cunningham,  lives in [place], [place], and works as a Statistician. The patient attends [employer].   ADVANCED DIRECTIVES:  HEALTH MAINTENANCE: History  Substance Use Topics  . Smoking status: Former Smoker -- 1.00 packs/day for 20 years    Types: Cigarettes    Quit date: 01/31/1988  . Smokeless tobacco: Never Used  . Alcohol Use: No     Colonoscopy:  PAP:  Bone density: July 2011, Normal  Lipid panel: UTD  Allergies  Allergen Reactions  . Chlorhexidine Hives, Itching and Rash    This was most likely a CONTACT DERMATITIS versus true systemic allergic reaction  . Adhesive (Tape) Hives  . Codeine Swelling    Current Outpatient Prescriptions  Medication Sig Dispense Refill  . aspirin 81 MG chewable tablet Chew 81 mg by mouth daily.      Marland Kitchen atorvastatin (LIPITOR) 40 MG tablet Take 1 tablet (40 mg total) by mouth daily.  90 tablet  1  . bisacodyl (DULCOLAX) 5 MG EC tablet Take 5 mg by mouth daily as needed. For constipation      . carvedilol (COREG) 12.5 MG tablet Take 1 tablet (12.5 mg total) by mouth 2 (two) times daily with a meal.  60 tablet  11  . chlorhexidine (PERIDEX) 0.12 % solution Rinse for 90  seconds  with 15 mLs three times daily. Spit out excess. Do NOT swallow.      . citalopram (CELEXA) 20 MG tablet Take 1 tablet (20 mg total) by mouth daily.  30 tablet  0  . clopidogrel (PLAVIX) 75 MG tablet  Take 1 tablet (75 mg total) by mouth daily.  30 tablet  6  . cyanocobalamin (,VITAMIN B-12,) 1000 MCG/ML injection Inject 1,000 mcg into the muscle every 30 (thirty) days. Last dose 03/09/2011      . darbepoetin (ARANESP) 200 MCG/0.4ML SOLN Inject 200 mcg into the skin once. Every 2 weeks      . hydrochlorothiazide (MICROZIDE) 12.5 MG capsule Take 1 capsule (12.5 mg total) by mouth daily.  30 capsule  6  . ibuprofen (ADVIL) 200 MG tablet Take 200 mg by mouth 3 (three) times daily.        . insulin glargine (LANTUS) 100 UNIT/ML injection Inject  60 Units into the skin at bedtime. Pt wants vials.  30 mL  3  . letrozole (FEMARA) 2.5 MG tablet Take 1 tablet (2.5 mg total) by mouth daily.  30 tablet  11  . levothyroxine (SYNTHROID) 200 MCG tablet Take 1 tablet (200 mcg total) by mouth daily.  30 tablet  11  . levothyroxine (SYNTHROID, LEVOTHROID) 75 MCG tablet Take 75 mcg by mouth daily.      Marland Kitchen losartan (COZAAR) 50 MG tablet Take 1 tablet (50 mg total) by mouth daily.  30 tablet  6  . metFORMIN (GLUCOPHAGE) 500 MG tablet Take 1 tablet (500 mg total) by mouth every morning.  30 tablet  5  . metoCLOPramide (REGLAN) 5 MG tablet Take 1 tablet (5 mg total) by mouth 4 (four) times daily -  before meals and at bedtime.  120 tablet  1  . oxyCODONE-acetaminophen (PERCOCET/ROXICET) 5-325 MG per tablet Take 1 tablet by mouth 4 (four) times daily as needed. For pain  120 tablet  0  . pantoprazole (PROTONIX) 40 MG tablet Take 1 tablet (40 mg total) by mouth daily.  30 tablet  3  . ranitidine (ZANTAC) 150 MG tablet Take 1 tablet (150 mg total) by mouth 2 (two) times daily.  30 tablet  3  . sennosides-docusate sodium (SENOKOT-S) 8.6-50 MG tablet 1 or 2 tabs by mouth daily      . [DISCONTINUED] simvastatin (ZOCOR) 40 MG tablet Take 40 mg by mouth every evening.       No current facility-administered medications for this visit.    OBJECTIVE: Elderly white female in no acute distress. Filed Vitals:   03/25/12 1146   BP: 155/64  Pulse: 75  Temp: 98.1 F (36.7 C)  Resp: 20     Body mass index is 41.89 kg/(m^2).    ECOG FS: 2  Filed Weights   03/25/12 1146  Weight: 236 lb 7 oz (107.247 kg)   Sclerae unicteric Oropharynx shows the area of osteonecrosis in the right maxilla, with visible sequestrant. There is no erythema or purulence.  No cervical or supraclavicular adenopathy Lungs no rales or rhonchi Heart regular rate and rhythm Abdomen obese, soft, nontender, positive bowel sounds  MSK no focal spinal tenderness No peripheral edema Neuro: nonfocal, well oriented with positive affect Breasts: Status post right mastectomy, no evidence of local recurrence. The right axilla is benign. The left breast is unremarkable.    LAB RESULTS: Lab Results  Component Value Date   WBC 6.9 03/18/2012   NEUTROABS 4.0 03/18/2012   HGB 11.1* 03/18/2012   HCT 33.7* 03/18/2012   MCV 87.6 03/18/2012   PLT 235 03/18/2012      Chemistry      Component Value Date/Time   NA 140 03/25/2012 1126   NA 137 09/12/2011 1516   K 4.7 03/25/2012 1126   K 5.0 09/12/2011 1516   CL 106 03/25/2012 1126   CL 105 09/12/2011 1516   CO2 25 03/25/2012 1126   CO2 22 09/12/2011 1516   BUN 32.2* 03/25/2012 1126   BUN 27* 09/12/2011 1516   CREATININE 1.1 03/25/2012 1126   CREATININE 1.4* 09/12/2011 1516      Component Value Date/Time   CALCIUM 9.3 03/25/2012 1126   CALCIUM 9.2 09/12/2011 1516   ALKPHOS 90 03/25/2012 1126   ALKPHOS 90 08/11/2011 0853   AST 15 03/25/2012 1126   AST 12 08/11/2011 0853   ALT 13 03/25/2012 1126   ALT 11 08/11/2011 0853   BILITOT 0.33  03/25/2012 1126   BILITOT 0.3 08/11/2011 0853     Results for CAYLAH, PLOUFF (MRN [id number]) as of 12/21/2011 10:18  Ref. Range 06/10/2009 19:41 07/27/2009 08:09 09/12/2011 15:16  Ferritin Latest Range: 10.0-291.0 ng/mL 797 (H) 432 (H) 115.4    Lab Results  Component Value Date   LABCA2 72* 09/28/2011    STUDIES:  CT scan of the abdomen November 03, 2013showed no evidence of  liver or other parenchymal involvement aside from bone (stable)   02/09/2012 *RADIOLOGY REPORT*  Clinical Data: Persistent cough  CHEST - 2 VIEW  Comparison: 07/22/2009  Findings: Right subclavian vein Port-A-Cath tip is at the  cavoatrial junction. Normal heart size. Bilateral central basilar  heterogeneous opacities have developed. This is a chronic finding.  No definite new consolidation. No pneumothorax and no pleural  effusion. Sclerotic changes throughout the bones are suspected.  IMPRESSION:  Chronic pulmonary parenchymal changes.  Diffuse sclerotic mets.  Original Report Authenticated By: Jolaine Click, M.D.    ASSESSMENT: A 72 year old [place] woman   (1) status post right breast biopsy May of 2011 for a grade 2 invasive ductal carcinoma, T2 NX M1, Stage IV, with bone-only involvement, strongly estrogen and progesterone receptor-positive, HER-2-negative with an MIB-1 of 26%.   (2) Neoadjuvantly she received letrozole and zoledronic acid beginning in June of 2011 and underwent right modified radical mastectomy February of 2012 for a ypT2 ypN2, grade 1 invasive ductal carcinoma with negative margins.   (3) She completed radiation therapy in August of 2012.   (4) She has continued on letrozole but was switched from zoledronic acid to denosumab Rivka Barbara) because of concerns regarding her serum creatinine. Rivka Barbara was being given every 8 weeks.  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 Specifically, she will continue on letrozole until there is definite evidence of disease progression. She'll continue to receive Aranesp every 2 weeks, B12  every 4 weeks, and we'll have her port flushed every 6 weeks.  Per her request, we're also going to try tapering the Celexa, and she will do this slowly over the next month.  Lindley is being scheduled for her left screening mammogram just slightly past due. She return to see [place] in 3 months, but knows to call prior that time with any changes or problems.  Zyiah Withington, PA-C 03/25/2012

## 2012-04-01 ENCOUNTER — Other Ambulatory Visit: Payer: Self-pay | Admitting: Lab

## 2012-04-01 ENCOUNTER — Ambulatory Visit: Payer: Self-pay

## 2012-04-01 ENCOUNTER — Telehealth: Payer: Self-pay | Admitting: *Deleted

## 2012-04-01 NOTE — Telephone Encounter (Signed)
Patient called and rescheduled lab and injection appointments due to the weather.  Reschedule to Wednesday 04/03/12

## 2012-04-03 ENCOUNTER — Other Ambulatory Visit: Payer: Self-pay | Admitting: Physician Assistant

## 2012-04-03 ENCOUNTER — Emergency Department (HOSPITAL_BASED_OUTPATIENT_CLINIC_OR_DEPARTMENT_OTHER)
Admission: EM | Admit: 2012-04-03 | Discharge: 2012-04-03 | Disposition: A | Discharge status: Home / Self Care | Payer: Medicare Other | Attending: Emergency Medicine | Admitting: Emergency Medicine

## 2012-04-03 ENCOUNTER — Telehealth: Payer: Self-pay | Admitting: *Deleted

## 2012-04-03 ENCOUNTER — Other Ambulatory Visit: Payer: Self-pay | Admitting: Lab

## 2012-04-03 ENCOUNTER — Encounter (HOSPITAL_BASED_OUTPATIENT_CLINIC_OR_DEPARTMENT_OTHER): Payer: Self-pay

## 2012-04-03 ENCOUNTER — Emergency Department (HOSPITAL_BASED_OUTPATIENT_CLINIC_OR_DEPARTMENT_OTHER): Payer: Medicare Other

## 2012-04-03 ENCOUNTER — Ambulatory Visit: Payer: Self-pay

## 2012-04-03 ENCOUNTER — Telehealth: Payer: Self-pay | Admitting: Oncology

## 2012-04-03 ENCOUNTER — Other Ambulatory Visit: Payer: Self-pay

## 2012-04-03 DIAGNOSIS — E785 Hyperlipidemia, unspecified: Secondary | ICD-10-CM | POA: Insufficient documentation

## 2012-04-03 DIAGNOSIS — M25519 Pain in unspecified shoulder: Secondary | ICD-10-CM | POA: Insufficient documentation

## 2012-04-03 DIAGNOSIS — Z8679 Personal history of other diseases of the circulatory system: Secondary | ICD-10-CM | POA: Insufficient documentation

## 2012-04-03 DIAGNOSIS — E119 Type 2 diabetes mellitus without complications: Secondary | ICD-10-CM | POA: Insufficient documentation

## 2012-04-03 DIAGNOSIS — F329 Major depressive disorder, single episode, unspecified: Secondary | ICD-10-CM | POA: Insufficient documentation

## 2012-04-03 DIAGNOSIS — Z853 Personal history of malignant neoplasm of breast: Secondary | ICD-10-CM | POA: Insufficient documentation

## 2012-04-03 DIAGNOSIS — R0602 Shortness of breath: Secondary | ICD-10-CM | POA: Insufficient documentation

## 2012-04-03 DIAGNOSIS — I2510 Atherosclerotic heart disease of native coronary artery without angina pectoris: Secondary | ICD-10-CM | POA: Insufficient documentation

## 2012-04-03 DIAGNOSIS — D649 Anemia, unspecified: Secondary | ICD-10-CM | POA: Insufficient documentation

## 2012-04-03 DIAGNOSIS — F411 Generalized anxiety disorder: Secondary | ICD-10-CM | POA: Insufficient documentation

## 2012-04-03 DIAGNOSIS — Z8739 Personal history of other diseases of the musculoskeletal system and connective tissue: Secondary | ICD-10-CM | POA: Insufficient documentation

## 2012-04-03 DIAGNOSIS — Z9861 Coronary angioplasty status: Secondary | ICD-10-CM | POA: Insufficient documentation

## 2012-04-03 DIAGNOSIS — F3289 Other specified depressive episodes: Secondary | ICD-10-CM | POA: Insufficient documentation

## 2012-04-03 DIAGNOSIS — C7951 Secondary malignant neoplasm of bone: Secondary | ICD-10-CM | POA: Insufficient documentation

## 2012-04-03 DIAGNOSIS — C50911 Malignant neoplasm of unspecified site of right female breast: Secondary | ICD-10-CM

## 2012-04-03 DIAGNOSIS — Z7982 Long term (current) use of aspirin: Secondary | ICD-10-CM | POA: Insufficient documentation

## 2012-04-03 DIAGNOSIS — C7952 Secondary malignant neoplasm of bone marrow: Secondary | ICD-10-CM | POA: Insufficient documentation

## 2012-04-03 DIAGNOSIS — M25512 Pain in left shoulder: Secondary | ICD-10-CM

## 2012-04-03 DIAGNOSIS — E039 Hypothyroidism, unspecified: Secondary | ICD-10-CM | POA: Insufficient documentation

## 2012-04-03 DIAGNOSIS — Z7902 Long term (current) use of antithrombotics/antiplatelets: Secondary | ICD-10-CM | POA: Insufficient documentation

## 2012-04-03 DIAGNOSIS — K219 Gastro-esophageal reflux disease without esophagitis: Secondary | ICD-10-CM | POA: Insufficient documentation

## 2012-04-03 DIAGNOSIS — Z8719 Personal history of other diseases of the digestive system: Secondary | ICD-10-CM | POA: Insufficient documentation

## 2012-04-03 DIAGNOSIS — G893 Neoplasm related pain (acute) (chronic): Secondary | ICD-10-CM | POA: Insufficient documentation

## 2012-04-03 DIAGNOSIS — I10 Essential (primary) hypertension: Secondary | ICD-10-CM | POA: Insufficient documentation

## 2012-04-03 DIAGNOSIS — Z794 Long term (current) use of insulin: Secondary | ICD-10-CM | POA: Insufficient documentation

## 2012-04-03 DIAGNOSIS — Z87891 Personal history of nicotine dependence: Secondary | ICD-10-CM | POA: Insufficient documentation

## 2012-04-03 DIAGNOSIS — Z79899 Other long term (current) drug therapy: Secondary | ICD-10-CM | POA: Insufficient documentation

## 2012-04-03 LAB — BASIC METABOLIC PANEL
BUN: 29 mg/dL — ABNORMAL HIGH (ref 6–23)
GFR calc Af Amer: 47 mL/min — ABNORMAL LOW (ref >90)
GFR calc non Af Amer: 41 mL/min — ABNORMAL LOW (ref >90)
Potassium: 5 mEq/L (ref 3.5–5.1)
Sodium: 136 mEq/L (ref 135–145)

## 2012-04-03 LAB — CBC WITH DIFFERENTIAL/PLATELET
Basophils Relative: 0 % (ref 0–1)
Eosinophils Absolute: 0.2 10*3/uL (ref 0.0–0.7)
MCH: 28.8 pg (ref 26.0–34.0)
MCHC: 32.3 g/dL (ref 30.0–36.0)
Neutrophils Relative %: 55 % (ref 43–77)
Platelets: 233 10*3/uL (ref 150–400)

## 2012-04-03 MED ORDER — ASPIRIN 81 MG PO CHEW
324.0000 mg | CHEWABLE_TABLET | Freq: Once | ORAL | Status: AC
  Administered 2012-04-03: 324 mg via ORAL
  Filled 2012-04-03: qty 4

## 2012-04-03 MED ORDER — SODIUM CHLORIDE 0.9 % IV SOLN: INTRAVENOUS | Status: DC

## 2012-04-03 MED ORDER — HYDROMORPHONE HCL PF 1 MG/ML IJ SOLN
1.0000 mg | Freq: Once | INTRAMUSCULAR | Status: AC
  Administered 2012-04-03: 1 mg via INTRAVENOUS
  Filled 2012-04-03: qty 1

## 2012-04-03 MED ORDER — ONDANSETRON HCL 4 MG/2ML IJ SOLN
4.0000 mg | Freq: Once | INTRAMUSCULAR | Status: AC
  Administered 2012-04-03: 4 mg via INTRAVENOUS
  Filled 2012-04-03: qty 2

## 2012-04-03 MED ORDER — SODIUM CHLORIDE 0.9 % IV BOLUS (SEPSIS)
250.0000 mL | Freq: Once | INTRAVENOUS | Status: AC
  Administered 2012-04-03: 250 mL via INTRAVENOUS

## 2012-04-03 NOTE — ED Notes (Signed)
MD at bedside. 

## 2012-04-03 NOTE — ED Notes (Signed)
Pt reports she awakened with left arm pain at 0230 this morning and continues to have pain in left arm.

## 2012-04-03 NOTE — ED Notes (Signed)
Pt taken to car by wheelchair- driven home by family- no new rx given

## 2012-04-03 NOTE — ED Provider Notes (Addendum)
History     CSN: [id number]  Arrival date & time 04/03/12  1128   First MD Initiated Contact with Patient 04/03/12 1222      Chief Complaint  Patient presents with  . Arm Pain    (Consider location/radiation/quality/duration/timing/severity/associated sxs/prior treatment) The history is provided by the patient.   72 year old female followed by hematology oncology at [facility] long for breast cancer that is known to be mid metastatic to bone. When it was first discovered sure he had bony metastases. She's never really experienced any severe bone pain. Patient awoke at 2:30 in the morning with significant left shoulder left arm pain. No chest pain no change in her baseline shortness of breath. No bowel pain. No back pain. Pain was 10 out of 10 was worse by raising arm above the head. Pain would radiate from the shoulder down the arm.  Past Medical History  Diagnosis Date  . Anemia   . Depression   . Diabetes mellitus type II   . GERD (gastroesophageal reflux disease)   . Hyperlipidemia   . Hypothyroidism   . Osteoarthritis   . Carotid artery stenosis     bilateral  . CAD (coronary artery disease)   . OSA (obstructive sleep apnea)   . Breast cancer     right breast cancer-invasive  ductal carcinoma (StageIV)  . Hypertension   . Anxiety   . Carotid artery occlusion   . Unspecified constipation 03/15/2012    Past Surgical History  Procedure Laterality Date  . Incisional breast biopsy      remoted left breast biopsy  . Cesarean section    . Other surgical history      GYN surgery  . Knee surgery      right knee surgery  . Carotid endarterectomy      right carotid  . Port-a-cath removal    . Modified mastectomy      Right breast  . Breast surgery    . Mastectomy    . Carotid stent      left    Family History  Problem Relation Age of Onset  . Arthritis    . Coronary artery disease      first degree relative  . Stroke      first degree relative  . Diabetes Mother    . Heart disease Mother   . Hypertension Mother   . Colon cancer Neg Hx   . Stomach cancer Neg Hx     History  Substance Use Topics  . Smoking status: Former Smoker -- 1.00 packs/day for 20 years    Types: Cigarettes    Quit date: 01/31/1988  . Smokeless tobacco: Never Used  . Alcohol Use: No    OB History   Grav Para Term Preterm Abortions TAB SAB Ect Mult Living                  Review of Systems  Constitutional: Negative for fever.  HENT: Negative for neck pain.   Eyes: Negative for visual disturbance.  Respiratory: Positive for shortness of breath.   Cardiovascular: Negative for chest pain.  Gastrointestinal: Negative for abdominal pain.  Genitourinary: Negative for dysuria.  Musculoskeletal: Negative for back pain.  Skin: Negative for rash.  Hematological: Does not bruise/bleed easily.    Allergies  Chlorhexidine; Adhesive; and Codeine  Home Medications   Current Outpatient Rx  Name  Route  Sig  Dispense  Refill  . aspirin 81 MG chewable tablet   Oral  Chew 81 mg by mouth daily.         Marland Kitchen atorvastatin (LIPITOR) 40 MG tablet   Oral   Take 1 tablet (40 mg total) by mouth daily.   90 tablet   1   . bisacodyl (DULCOLAX) 5 MG EC tablet   Oral   Take 5 mg by mouth daily as needed. For constipation         . carvedilol (COREG) 12.5 MG tablet   Oral   Take 1 tablet (12.5 mg total) by mouth 2 (two) times daily with a meal.   60 tablet   11   . chlorhexidine (PERIDEX) 0.12 % solution      Rinse for 90  seconds  with 15 mLs three times daily. Spit out excess. Do NOT swallow.         . citalopram (CELEXA) 20 MG tablet   Oral   Take 1 tablet (20 mg total) by mouth daily.   30 tablet   0   . clopidogrel (PLAVIX) 75 MG tablet   Oral   Take 1 tablet (75 mg total) by mouth daily.   30 tablet   6   . cyanocobalamin (,VITAMIN B-12,) 1000 MCG/ML injection   Intramuscular   Inject 1,000 mcg into the muscle every 30 (thirty) days. Last dose  03/09/2011         . darbepoetin (ARANESP) 200 MCG/0.4ML SOLN   Subcutaneous   Inject 200 mcg into the skin once. Every 2 weeks         . hydrochlorothiazide (MICROZIDE) 12.5 MG capsule   Oral   Take 1 capsule (12.5 mg total) by mouth daily.   30 capsule   6   . ibuprofen (ADVIL) 200 MG tablet   Oral   Take 200 mg by mouth 3 (three) times daily.           . insulin glargine (LANTUS) 100 UNIT/ML injection   Subcutaneous   Inject 60 Units into the skin at bedtime. Pt wants vials.   30 mL   3   . letrozole (FEMARA) 2.5 MG tablet   Oral   Take 1 tablet (2.5 mg total) by mouth daily.   30 tablet   11   . levothyroxine (SYNTHROID) 200 MCG tablet   Oral   Take 1 tablet (200 mcg total) by mouth daily.   30 tablet   11   . levothyroxine (SYNTHROID, LEVOTHROID) 75 MCG tablet   Oral   Take 75 mcg by mouth daily.         Marland Kitchen losartan (COZAAR) 50 MG tablet   Oral   Take 1 tablet (50 mg total) by mouth daily.   30 tablet   6   . metFORMIN (GLUCOPHAGE) 500 MG tablet   Oral   Take 1 tablet (500 mg total) by mouth every morning.   30 tablet   5   . metoCLOPramide (REGLAN) 5 MG tablet   Oral   Take 1 tablet (5 mg total) by mouth 4 (four) times daily -  before meals and at bedtime.   120 tablet   1   . oxyCODONE-acetaminophen (PERCOCET/ROXICET) 5-325 MG per tablet   Oral   Take 1 tablet by mouth 4 (four) times daily as needed. For pain   120 tablet   0   . pantoprazole (PROTONIX) 40 MG tablet   Oral   Take 1 tablet (40 mg total) by mouth daily.   30 tablet   3   .  ranitidine (ZANTAC) 150 MG tablet   Oral   Take 1 tablet (150 mg total) by mouth 2 (two) times daily.   30 tablet   3   . sennosides-docusate sodium (SENOKOT-S) 8.6-50 MG tablet      1 or 2 tabs by mouth daily           BP 191/58  Pulse 72  Temp(Src) 98.4 F (36.9 C) (Oral)  Resp 18  Ht 5\' 3"  (1.6 m)  Wt 230 lb (104.327 kg)  BMI 40.75 kg/m2  SpO2 100%  LMP 03/13/2012  Physical  Exam  Nursing note and vitals reviewed. Constitutional: She is oriented to person, place, and time. She appears well-developed and well-nourished. No distress.  HENT:  Head: Normocephalic and atraumatic.  Mouth/Throat: Oropharynx is clear and moist.  Eyes: Conjunctivae and EOM are normal. Pupils are equal, round, and reactive to light.  Neck: Normal range of motion. Neck supple.  Cardiovascular: Normal rate and regular rhythm.   No murmur heard. Pulmonary/Chest: Effort normal and breath sounds normal.  Abdominal: Soft. Bowel sounds are normal. There is no tenderness.  Musculoskeletal: She exhibits no edema.  Decreased range of motion in the left shoulder increased pain with moving arm above the head. Radial pulses 2+ distally neurovascularly intact to the hand. No deformity. Nontender to palpation.  Neurological: She is alert and oriented to person, place, and time. No cranial nerve deficit. She exhibits normal muscle tone. Coordination normal.  Skin: Skin is warm. No rash noted.    ED Course  Procedures (including critical care time)  Labs Reviewed  CBC WITH DIFFERENTIAL - Abnormal; Notable for the following:    RBC 3.71 (*)    Hemoglobin 10.7 (*)    HCT 33.1 (*)    RDW 16.0 (*)    All other components within normal limits  BASIC METABOLIC PANEL - Abnormal; Notable for the following:    Glucose, Bld 171 (*)    BUN 29 (*)    Creatinine, Ser 1.30 (*)    GFR calc non Af Amer 41 (*)    GFR calc Af Amer 47 (*)    All other components within normal limits  TROPONIN I   Dg Chest 2 View  04/03/2012  *RADIOLOGY REPORT*  Clinical Data: Left arm pain  CHEST - 2 VIEW  Comparison: 02/08/2012  Findings: Chronic interstitial markings.  No focal consolidation. No pleural effusion or pneumothorax.  Mild cardiomegaly.  Stable right chest port.  Degenerative changes of the visualized thoracolumbar spine.  Widespread sclerotic osseous metastases.  IMPRESSION: No evidence of acute cardiopulmonary  disease.  Widespread sclerotic osseous metastases.   Original Report Authenticated By: Charline Bills, M.D.    Dg Shoulder Left  04/03/2012  *RADIOLOGY REPORT*  Clinical Data: Left arm pain  LEFT SHOULDER - 2+ VIEW  Comparison: None.  Findings: The patient achieves good internal and external rotation. No evidence of fracture or dislocation.  Extensive widespread sclerotic osseous metastatic disease is noted affecting all bones.  IMPRESSION: Widespread osseous metastatic disease.  No fracture or dislocation. No dominant osseous lesion.   Original Report Authenticated By: Paulina Fusi, M.D.    Dg Humerus Left  04/03/2012  *RADIOLOGY REPORT*  Clinical Data: Pain  LEFT HUMERUS - 2+ VIEW  Comparison: Same day shoulder  Findings: Sclerotic changes are noted of the humeral head, scapula and ribs.  Lesser changes more distally in the humerus.  No evidence of fracture or dominant lytic lesion.  IMPRESSION: osseous metastatic disease.  No  fracture or dominant lytic lesion.   Original Report Authenticated By: Paulina Fusi, M.D.      Date: 04/03/2012  Rate: 69  Rhythm: normal sinus rhythm and sinus arrhythmia  QRS Axis: normal  Intervals: normal  ST/T Wave abnormalities: normal  Conduction Disutrbances:none  Narrative Interpretation:   Old EKG Reviewed: unchanged EKG is unchanged from 03/01/2011.    Results for orders placed during the hospital encounter of 04/03/12  CBC WITH DIFFERENTIAL      Result Value Range   WBC 7.5  4.0 - 10.5 K/uL   RBC 3.71 (*) 3.87 - 5.11 MIL/uL   Hemoglobin 10.7 (*) 12.0 - 15.0 g/dL   HCT 16.1 (*) 09.6 - 04.5 %   MCV 89.2  78.0 - 100.0 fL   MCH 28.8  26.0 - 34.0 pg   MCHC 32.3  30.0 - 36.0 g/dL   RDW 40.9 (*) 81.1 - 91.4 %   Platelets 233  150 - 400 K/uL   Neutrophils Relative 55  43 - 77 %   Neutro Abs 4.1  1.7 - 7.7 K/uL   Lymphocytes Relative 31  12 - 46 %   Lymphs Abs 2.4  0.7 - 4.0 K/uL   Monocytes Relative 11  3 - 12 %   Monocytes Absolute 0.8  0.1 - 1.0  K/uL   Eosinophils Relative 3  0 - 5 %   Eosinophils Absolute 0.2  0.0 - 0.7 K/uL   Basophils Relative 0  0 - 1 %   Basophils Absolute 0.0  0.0 - 0.1 K/uL  BASIC METABOLIC PANEL      Result Value Range   Sodium 136  135 - 145 mEq/L   Potassium 5.0  3.5 - 5.1 mEq/L   Chloride 101  96 - 112 mEq/L   CO2 22  19 - 32 mEq/L   Glucose, Bld 171 (*) 70 - 99 mg/dL   BUN 29 (*) 6 - 23 mg/dL   Creatinine, Ser 7.82 (*) 0.50 - 1.10 mg/dL   Calcium 9.9  8.4 - 95.6 mg/dL   GFR calc non Af Amer 41 (*) >90 mL/min   GFR calc Af Amer 47 (*) >90 mL/min  TROPONIN I      Result Value Range   Troponin I <0.30  <0.30 ng/mL     1. Shoulder pain, acute, left   2. Breast cancer metastasized to bone, right       MDM  Patient with known history of metastatic breast cancer. Known to have been metastasized to the bone for the past 3 years. She's never had significant pain in either of her shoulders started with acute pain in her left shoulder at about 2:30 in the morning. Still possible is good the rotator cuff injury but with a significant bony metastases in the area it also may just be related to that. Patient will contact her hematologist oncologist for comparison and for further workup. Patient already on extensive pain medication at home. Sling will be provided to rest the arm. Patient will return for any newer worse symptoms. No evidence of any other sniffing concerns EKG had no acute changes on it troponin was negative the duration of the pain in the left arm is cardiac related should have been positive.        Shelda Jakes, MD 04/03/12 2130  Shelda Jakes, MD 04/03/12 [phone]

## 2012-04-03 NOTE — Telephone Encounter (Signed)
Received call from daughter Bryson Ha re:  Pt was taken to ER this am by her son for severe left arm pain.  Pt is still currently in ER.  Pt and family were instructed to see the oncologist ASAP for possible cancer related pain to the left arm.   Informed Silva Bandy that message will be related to Dr. Darnelle Catalan.   Kristi requested retuned phone call to be made to her with md's instructions. Kristi's  Phone    [phone].

## 2012-04-03 NOTE — ED Notes (Signed)
Patient transported to X-ray 

## 2012-04-04 ENCOUNTER — Ambulatory Visit (HOSPITAL_COMMUNITY): Payer: Medicare Other

## 2012-04-04 ENCOUNTER — Telehealth: Payer: Self-pay | Admitting: Oncology

## 2012-04-04 ENCOUNTER — Telehealth: Payer: Self-pay | Admitting: *Deleted

## 2012-04-04 ENCOUNTER — Ambulatory Visit (HOSPITAL_BASED_OUTPATIENT_CLINIC_OR_DEPARTMENT_OTHER): Payer: Medicare Other | Admitting: Physician Assistant

## 2012-04-04 ENCOUNTER — Encounter: Payer: Self-pay | Admitting: Physician Assistant

## 2012-04-04 ENCOUNTER — Ambulatory Visit (HOSPITAL_COMMUNITY): Admission: RE | Admit: 2012-04-04 | Payer: Medicare Other | Source: Ambulatory Visit

## 2012-04-04 VITALS — BP 111/68 | HR 71 | Temp 97.7°F | Resp 20 | Ht 63.0 in | Wt 236.1 lb

## 2012-04-04 DIAGNOSIS — M25519 Pain in unspecified shoulder: Secondary | ICD-10-CM

## 2012-04-04 DIAGNOSIS — C50919 Malignant neoplasm of unspecified site of unspecified female breast: Secondary | ICD-10-CM

## 2012-04-04 DIAGNOSIS — C7951 Secondary malignant neoplasm of bone: Secondary | ICD-10-CM

## 2012-04-04 DIAGNOSIS — IMO0002 Reserved for concepts with insufficient information to code with codable children: Secondary | ICD-10-CM

## 2012-04-04 DIAGNOSIS — Z170 Estrogen receptor positive status [ER+]: Secondary | ICD-10-CM

## 2012-04-04 NOTE — Telephone Encounter (Signed)
, °

## 2012-04-04 NOTE — Progress Notes (Signed)
ID: Brittney Cunningham   DOB: [birth year]  MR#: [id number]  CSN#:[id number]  PCP: Estill Cotta, MD GYN: SU: Emelia Loron OTHER MD: Cindra Eves, Dorothy Puffer  HISTORY OF PRESENT ILLNESS: Brittney Cunningham was diagnosed with right breast carcinoma in May of 2011, a biopsy showing a grade 2 invasive ductal carcinoma, T2 NXM1, stage IV at presentation. There was bone only involvement. Tumor was strongly ER and PR positive, HER-2/neu negative, with an MIP-1 of 26%.  She was treated neoadjuvantly with letrozole and zoledronic acid beginning in June of 2011. She is status post right modified radical mastectomy in February 2012 for a ypT2 ypN2, grade 1 invasive ductal carcinoma. Margins were negative. Her subsequent history is as detailed below  INTERVAL HISTORY: Brittney Cunningham returns today accompanied by her daughter, Brittney Cunningham, for evaluation in between her regular followup appointments for metastatic breast carcinoma. She was seen in the emergency department yesterday, 04/03/2012, for severe left arm pain, a 10 out of 10. This is a radiating pain, from the shoulder down into the arm. Her hand has been a little swollen, but only mildly so. She is unable to use the left arm due to pain with movement, and of course her range of motion is limited as well. She denies any falls or known injuries.  A chest x-ray, shoulder x-ray, and left humerous x-ray were all obtained in the Emergency Department yesterday. All of these confirmed widespread sclerotic osseous metastases, but no evidence of fractures or dislocations.  REVIEW OF SYSTEMS: Brittney Cunningham denies any fevers or chills. She's eating and drinking fairly well with no nausea or change in bowel habits. She has shortness of breath with exertion but this has not changed. She denies any abnormal headaches or dizziness. She continues to have some chronic pain, primarily joint pain associated with arthritis, but has no additional changes other than in the left shoulder and arm.    A detailed review of systems is otherwise stable and noncontributory.  PAST MEDICAL HISTORY: Past Medical History  Diagnosis Date  . Anemia   . Depression   . Diabetes mellitus type II   . GERD (gastroesophageal reflux disease)   . Hyperlipidemia   . Hypothyroidism   . Osteoarthritis   . Carotid artery stenosis     bilateral  . CAD (coronary artery disease)   . OSA (obstructive sleep apnea)   . Breast cancer     right breast cancer-invasive  ductal carcinoma (StageIV)  . Hypertension   . Anxiety   . Carotid artery occlusion   . Unspecified constipation 03/15/2012    PAST SURGICAL HISTORY: Past Surgical History  Procedure Laterality Date  . Incisional breast biopsy      remoted left breast biopsy  . Cesarean section    . Other surgical history      GYN surgery  . Knee surgery      right knee surgery  . Carotid endarterectomy      right carotid  . Port-a-cath removal    . Modified mastectomy      Right breast  . Breast surgery    . Mastectomy    . Carotid stent      left    FAMILY HISTORY Family History  Problem Relation Age of Onset  . Arthritis    . Coronary artery disease      first degree relative  . Stroke      first degree relative  . Diabetes Mother   . Heart disease Mother   . Hypertension  Mother   . Colon cancer Neg Hx   . Stomach cancer Neg Hx   The patient's father died from pancreatic cancer at the age of 13. The patient's mother died from complications of diabetes and heart disease at the age of 53. The patient has 2 sisters and 2 brothers. There is no history of breast cancer or ovarian cancer in the immediate family. One of the patient's paternal aunts out of 6 paternal aunts had breast cancer diagnosed in her 85s.    GYNECOLOGIC HISTORY: The patient is GX, P3. First pregnancy to term age 61. She went through the change of life in her late 11s. She never took hormones.   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 Her husband,, Brittney Cunningham, is disabled secondary to a fall. He has difficulty with walking. He used to work for [employer] previously. The patient's Cunningham, Brittney Cunningham,  lives in [place], and works for [employer]. Daughter, Brittney Cunningham, lives in [place], and works for [employer]. Daughter, Brittney Cunningham,  lives in [place], [place], and works as a Statistician. The patient attends [employer].   ADVANCED DIRECTIVES:  HEALTH MAINTENANCE: History  Substance Use Topics  . Smoking status: Former Smoker -- 1.00 packs/day for 20 years    Types: Cigarettes    Quit date: 01/31/1988  . Smokeless tobacco: Never Used  . Alcohol Use: No     Colonoscopy:  PAP:  Bone density: July 2011, Normal  Lipid panel: UTD  Allergies  Allergen Reactions  . Chlorhexidine Hives, Itching and Rash    This was most likely a CONTACT DERMATITIS versus true systemic allergic reaction  . Adhesive (Tape) Hives  . Codeine Swelling    Current Outpatient Prescriptions  Medication Sig Dispense Refill  . aspirin 81 MG chewable tablet Chew 81 mg by mouth daily.      Marland Kitchen atorvastatin (LIPITOR) 40 MG tablet Take 1 tablet (40 mg total) by mouth daily.  90 tablet  1  . carvedilol (COREG) 12.5 MG tablet Take 1 tablet (12.5 mg total) by mouth 2 (two) times daily with a meal.  60 tablet  11  . chlorhexidine (PERIDEX) 0.12 % solution Rinse for 90  seconds  with 15 mLs three times daily. Spit out excess. Do NOT swallow.      . clopidogrel (PLAVIX) 75 MG tablet Take 1 tablet (75 mg total) by mouth daily.  30 tablet  6  . cyanocobalamin (,VITAMIN B-12,) 1000 MCG/ML injection Inject 1,000 mcg into the muscle every 30 (thirty) days. Last dose 03/09/2011      . darbepoetin (ARANESP) 200 MCG/0.4ML SOLN Inject 200 mcg into the skin once. Every 2 weeks      . hydrochlorothiazide (MICROZIDE) 12.5 MG capsule Take 1 capsule (12.5 mg total) by mouth daily.  30 capsule  6  . ibuprofen (ADVIL) 200 MG tablet Take 200 mg by  mouth 3 (three) times daily.        . insulin glargine (LANTUS) 100 UNIT/ML injection Inject 60 Units into the skin at bedtime. Pt wants vials.  30 mL  3  . letrozole (FEMARA) 2.5 MG tablet Take 1 tablet (2.5 mg total) by mouth daily.  30 tablet  11  . levothyroxine (SYNTHROID) 200 MCG tablet Take 1 tablet (200 mcg total) by mouth daily.  30 tablet  11  . levothyroxine (SYNTHROID, LEVOTHROID) 75 MCG tablet Take 75 mcg by mouth daily.      Marland Kitchen losartan (COZAAR)  50 MG tablet Take 1 tablet (50 mg total) by mouth daily.  30 tablet  6  . metFORMIN (GLUCOPHAGE) 500 MG tablet Take 1 tablet (500 mg total) by mouth every morning.  30 tablet  5  . metoCLOPramide (REGLAN) 5 MG tablet Take 1 tablet (5 mg total) by mouth 4 (four) times daily -  before meals and at bedtime.  120 tablet  1  . oxyCODONE-acetaminophen (PERCOCET/ROXICET) 5-325 MG per tablet Take 1 tablet by mouth 4 (four) times daily as needed. For pain  120 tablet  0  . pantoprazole (PROTONIX) 40 MG tablet Take 1 tablet (40 mg total) by mouth daily.  30 tablet  3  . ranitidine (ZANTAC) 150 MG tablet Take 1 tablet (150 mg total) by mouth 2 (two) times daily.  30 tablet  3  . sennosides-docusate sodium (SENOKOT-S) 8.6-50 MG tablet 1 or 2 tabs by mouth daily      . [DISCONTINUED] simvastatin (ZOCOR) 40 MG tablet Take 40 mg by mouth every evening.       No current facility-administered medications for this visit.    OBJECTIVE: Elderly white female who appears very uncomfortable Filed Vitals:   04/04/12 1112  BP: 111/68  Pulse: 71  Temp: 97.7 F (36.5 C)  Resp: 20     Body mass index is 41.83 kg/(m^2).    ECOG FS: 2  Filed Weights   04/04/12 1112  Weight: 236 lb 1.6 oz (107.094 kg)   No cervical or supraclavicular adenopathy MSK mild swelling is noted in the left upper extremity, nonpitting. Strength is 3-4/5 in the left upper extremity, 5 out of 5 in the right upper extremity. There is limited range of motion in the left extremity secondary  to pain. There is no swelling in the shoulder, left chest wall, or neck area. No obvious deformities or injuries.  The arm and shoulder itself is nontender to palpation.  Neuro: nonfocal, well oriented with positive affect    LAB RESULTS: Lab Results  Component Value Date   WBC 7.5 04/03/2012   NEUTROABS 4.1 04/03/2012   HGB 10.7* 04/03/2012   HCT 33.1* 04/03/2012   MCV 89.2 04/03/2012   PLT 233 04/03/2012      Chemistry      Component Value Date/Time   NA 136 04/03/2012 1250   NA 140 03/25/2012 1126   K 5.0 04/03/2012 1250   K 4.7 03/25/2012 1126   CL 101 04/03/2012 1250   CL 106 03/25/2012 1126   CO2 22 04/03/2012 1250   CO2 25 03/25/2012 1126   BUN 29* 04/03/2012 1250   BUN 32.2* 03/25/2012 1126   CREATININE 1.30* 04/03/2012 1250   CREATININE 1.1 03/25/2012 1126      Component Value Date/Time   CALCIUM 9.9 04/03/2012 1250   CALCIUM 9.3 03/25/2012 1126   ALKPHOS 90 03/25/2012 1126   ALKPHOS 90 08/11/2011 0853   AST 15 03/25/2012 1126   AST 12 08/11/2011 0853   ALT 13 03/25/2012 1126   ALT 11 08/11/2011 0853   BILITOT 0.33 03/25/2012 1126   BILITOT 0.3 08/11/2011 0853     Results for EIKO, MCGOWEN (MRN [id number]) as of 12/21/2011 10:18  Ref. Range 06/10/2009 19:41 07/27/2009 08:09 09/12/2011 15:16  Ferritin Latest Range: 10.0-291.0 ng/mL 797 (H) 432 (H) 115.4    Lab Results  Component Value Date   LABCA2 18* 09/28/2011    STUDIES:  CT scan of the abdomen October 24, 2013showed no evidence of liver or other parenchymal  involvement aside from bone (stable)   02/09/2012 *RADIOLOGY REPORT*  Clinical Data: Persistent cough  CHEST - 2 VIEW  Comparison: 07/22/2009  Findings: Right subclavian vein Port-A-Cath tip is at the  cavoatrial junction. Normal heart size. Bilateral central basilar  heterogeneous opacities have developed. This is a chronic finding.  No definite new consolidation. No pneumothorax and no pleural  effusion. Sclerotic changes throughout the bones are suspected.  IMPRESSION:   Chronic pulmonary parenchymal changes.  Diffuse sclerotic mets.  Original Report Authenticated By: Jolaine Click, M.D.  Dg Chest 2 View  04/03/2012  *RADIOLOGY REPORT*  Clinical Data: Left arm pain  CHEST - 2 VIEW  Comparison: 02/08/2012  Findings: Chronic interstitial markings.  No focal consolidation. No pleural effusion or pneumothorax.  Mild cardiomegaly.  Stable right chest port.  Degenerative changes of the visualized thoracolumbar spine.  Widespread sclerotic osseous metastases.  IMPRESSION: No evidence of acute cardiopulmonary disease.  Widespread sclerotic osseous metastases.   Original Report Authenticated By: Charline Bills, M.D.    Dg Shoulder Left  04/03/2012  *RADIOLOGY REPORT*  Clinical Data: Left arm pain  LEFT SHOULDER - 2+ VIEW  Comparison: None.  Findings: The patient achieves good internal and external rotation. No evidence of fracture or dislocation.  Extensive widespread sclerotic osseous metastatic disease is noted affecting all bones.  IMPRESSION: Widespread osseous metastatic disease.  No fracture or dislocation. No dominant osseous lesion.   Original Report Authenticated By: Paulina Fusi, M.D.    Dg Humerus Left  04/03/2012  *RADIOLOGY REPORT*  Clinical Data: Pain  LEFT HUMERUS - 2+ VIEW  Comparison: Same day shoulder  Findings: Sclerotic changes are noted of the humeral head, scapula and ribs.  Lesser changes more distally in the humerus.  No evidence of fracture or dominant lytic lesion.  IMPRESSION: osseous metastatic disease.  No fracture or dominant lytic lesion.   Original Report Authenticated By: Paulina Fusi, M.D.     ASSESSMENT: A 72 year old [place] woman   (1) status post right breast biopsy May of 2011 for a grade 2 invasive ductal carcinoma, T2 NX M1, Stage IV, with bone-only involvement, strongly estrogen and progesterone receptor-positive, HER-2-negative with an MIB-1 of 26%.   (2) Neoadjuvantly she received letrozole and zoledronic acid beginning in June  of 2011 and underwent right modified radical mastectomy February of 2012 for a ypT2 ypN2, grade 1 invasive ductal carcinoma with negative margins.   (3) She completed radiation therapy in August of 2012.   (4) She has continued on letrozole but was switched from zoledronic acid to denosumab Brittney Cunningham) because of concerns regarding her serum creatinine. Brittney Cunningham was being given every 8 weeks.  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 Brittney Cunningham will continue on the oxycodone/APAP, and I have advised her to take this on a regular basis over the next couple of days to control her severe pain. She'll take no more than 8 tablets in a 24-hour period, and is Brittney Cunningham and her daughter voice understanding of the above.  I am also referring Brittney Cunningham for an MRI of the left shoulder and a chest MRI for further evaluation of the brachial plexus for possible nerve compression. She will see me again  next week on March 11 to review those results and discuss her treatment plan. She knows to contact us, however, prior to that time if her pain worsens or if the oxycodone is no longer relieving the pain.   AMY BERRY, PA-C 03/25/2012

## 2012-04-04 NOTE — Telephone Encounter (Signed)
This RN obtained authorization for MRI of upper extremity of [phone].

## 2012-04-05 ENCOUNTER — Inpatient Hospital Stay (HOSPITAL_COMMUNITY): Admission: RE | Admit: 2012-04-05 | Payer: Medicare Other | Source: Ambulatory Visit

## 2012-04-05 ENCOUNTER — Other Ambulatory Visit (HOSPITAL_COMMUNITY): Payer: Medicare Other

## 2012-04-08 ENCOUNTER — Ambulatory Visit (HOSPITAL_COMMUNITY)
Admission: RE | Admit: 2012-04-08 | Discharge: 2012-04-08 | Disposition: A | Discharge status: Home / Self Care | Payer: Medicare Other | Source: Ambulatory Visit | Attending: Physician Assistant | Admitting: Physician Assistant

## 2012-04-08 ENCOUNTER — Other Ambulatory Visit: Payer: Self-pay | Admitting: Physician Assistant

## 2012-04-08 DIAGNOSIS — R29898 Other symptoms and signs involving the musculoskeletal system: Secondary | ICD-10-CM | POA: Insufficient documentation

## 2012-04-08 DIAGNOSIS — C7952 Secondary malignant neoplasm of bone marrow: Secondary | ICD-10-CM | POA: Insufficient documentation

## 2012-04-08 DIAGNOSIS — M5020 Other cervical disc displacement, unspecified cervical region: Secondary | ICD-10-CM | POA: Insufficient documentation

## 2012-04-08 DIAGNOSIS — M719 Bursopathy, unspecified: Secondary | ICD-10-CM | POA: Insufficient documentation

## 2012-04-08 DIAGNOSIS — M19019 Primary osteoarthritis, unspecified shoulder: Secondary | ICD-10-CM | POA: Insufficient documentation

## 2012-04-08 DIAGNOSIS — C50919 Malignant neoplasm of unspecified site of unspecified female breast: Secondary | ICD-10-CM

## 2012-04-08 DIAGNOSIS — M25519 Pain in unspecified shoulder: Secondary | ICD-10-CM | POA: Insufficient documentation

## 2012-04-08 DIAGNOSIS — M67919 Unspecified disorder of synovium and tendon, unspecified shoulder: Secondary | ICD-10-CM | POA: Insufficient documentation

## 2012-04-08 DIAGNOSIS — C7951 Secondary malignant neoplasm of bone: Secondary | ICD-10-CM

## 2012-04-08 DIAGNOSIS — M79609 Pain in unspecified limb: Secondary | ICD-10-CM | POA: Insufficient documentation

## 2012-04-09 ENCOUNTER — Other Ambulatory Visit (HOSPITAL_BASED_OUTPATIENT_CLINIC_OR_DEPARTMENT_OTHER): Payer: Medicare Other | Admitting: Lab

## 2012-04-09 ENCOUNTER — Telehealth: Payer: Self-pay | Admitting: Oncology

## 2012-04-09 ENCOUNTER — Encounter: Payer: Self-pay | Admitting: Physician Assistant

## 2012-04-09 ENCOUNTER — Ambulatory Visit (HOSPITAL_BASED_OUTPATIENT_CLINIC_OR_DEPARTMENT_OTHER): Payer: Medicare Other | Admitting: Physician Assistant

## 2012-04-09 VITALS — BP 131/66 | HR 73 | Temp 98.0°F | Resp 20 | Ht 63.0 in | Wt 233.4 lb

## 2012-04-09 DIAGNOSIS — M1990 Unspecified osteoarthritis, unspecified site: Secondary | ICD-10-CM

## 2012-04-09 DIAGNOSIS — C50911 Malignant neoplasm of unspecified site of right female breast: Secondary | ICD-10-CM

## 2012-04-09 DIAGNOSIS — C7951 Secondary malignant neoplasm of bone: Secondary | ICD-10-CM

## 2012-04-09 DIAGNOSIS — M79609 Pain in unspecified limb: Secondary | ICD-10-CM

## 2012-04-09 DIAGNOSIS — D649 Anemia, unspecified: Secondary | ICD-10-CM

## 2012-04-09 LAB — CBC WITH DIFFERENTIAL/PLATELET
BASO%: 0.5 % (ref 0.0–2.0)
LYMPH%: 24 % (ref 14.0–49.7)
MCHC: 32.8 g/dL (ref 31.5–36.0)
MONO#: 0.8 10*3/uL (ref 0.1–0.9)
Platelets: 226 10*3/uL (ref 145–400)
RBC: 3.66 10*6/uL — ABNORMAL LOW (ref 3.70–5.45)
WBC: 8.7 10*3/uL (ref 3.9–10.3)

## 2012-04-09 LAB — FERRITIN: Ferritin: 373 ng/mL — ABNORMAL HIGH (ref 10–291)

## 2012-04-09 LAB — COMPREHENSIVE METABOLIC PANEL (CC13)
ALT: 15 U/L (ref 0–55)
AST: 13 U/L (ref 5–34)
Alkaline Phosphatase: 99 U/L (ref 40–150)
CO2: 23 mEq/L (ref 22–29)
Sodium: 138 mEq/L (ref 136–145)
Total Bilirubin: 0.5 mg/dL (ref 0.20–1.20)
Total Protein: 6.8 g/dL (ref 6.4–8.3)

## 2012-04-09 NOTE — Progress Notes (Signed)
ID: Brittney Cunningham   DOB: 1940-06-27  MR#: [id number]  CSN#:[id number]  PCP: Brittney Cotta, MD GYN: SU: Brittney Cunningham OTHER MD: Brittney Cunningham, Brittney Cunningham  HISTORY OF PRESENT ILLNESS: Brittney Cunningham was diagnosed with right breast carcinoma in May of 2011, a biopsy showing a grade 2 invasive ductal carcinoma, T2 NXM1, stage IV at presentation. There was bone only involvement. Tumor was strongly ER and PR positive, HER-2/neu negative, with an MIP-1 of 26%.  She was treated neoadjuvantly with letrozole and zoledronic acid beginning in June of 2011. She is status post right modified radical mastectomy in February 2012 for a ypT2 ypN2, grade 1 invasive ductal carcinoma. Margins were negative. Her subsequent history is as detailed below  INTERVAL HISTORY: Nil returns today accompanied by her husband, Brittney Cunningham,, for evaluation of her left upper extremity pain. She was evaluated in the emergency room last week, followed by an appointment here in our office. She had MRIs of the left shoulder and left brachial plexus yesterday. Fortunately, although these continue to show osseous metastasis, it appears that the primary cause of her pain is a combination of osteoarthritis, tendinopathy, and bursitis. She continues on oxycodone/APAP with good pain control. She does Cunningham that she is having more hot flashes, likely associated with the pain medication.  REVIEW OF SYSTEMS: Brittney Cunningham denies any fevers or chills. She's eating and drinking fairly well with no nausea or change in bowel habits despite increased use of pain medications. She has shortness of breath with exertion but this has not changed. She denies any abnormal headaches or dizziness. She continues to have some chronic pain, primarily joint pain associated with arthritis, but has no additional changes other than in the left shoulder and arm.   A detailed review of systems is otherwise stable and noncontributory.  PAST MEDICAL HISTORY: Past Medical  History  Diagnosis Date  . Anemia   . Depression   . Diabetes mellitus type II   . GERD (gastroesophageal reflux disease)   . Hyperlipidemia   . Hypothyroidism   . Osteoarthritis   . Carotid artery stenosis     bilateral  . CAD (coronary artery disease)   . OSA (obstructive sleep apnea)   . Breast cancer     right breast cancer-invasive  ductal carcinoma (StageIV)  . Hypertension   . Anxiety   . Carotid artery occlusion   . Unspecified constipation 03/15/2012    PAST SURGICAL HISTORY: Past Surgical History  Procedure Laterality Date  . Incisional breast biopsy      remoted left breast biopsy  . Cesarean section    . Other surgical history      GYN surgery  . Knee surgery      right knee surgery  . Carotid endarterectomy      right carotid  . Port-a-cath removal    . Modified mastectomy      Right breast  . Breast surgery    . Mastectomy    . Carotid stent      left    FAMILY HISTORY Family History  Problem Relation Age of Onset  . Arthritis    . Coronary artery disease      first degree relative  . Stroke      first degree relative  . Diabetes Mother   . Heart disease Mother   . Hypertension Mother   . Colon cancer Neg Hx   . Stomach cancer Neg Hx   The patient's father died from pancreatic cancer at the  age of 15. The patient's mother died from complications of diabetes and heart disease at the age of 28. The patient has 2 sisters and 2 brothers. There is no history of breast cancer or ovarian cancer in the immediate family. One of the patient's paternal aunts out of 6 paternal aunts had breast cancer diagnosed in her 88s.    GYNECOLOGIC HISTORY: The patient is GX, P3. First pregnancy to term age 10. She went through the change of life in her late 29s. She never took hormones.   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 Her husband,, Brittney Cunningham, is disabled secondary to a fall. He has difficulty with walking. He used to  work for [employer] previously. The patient's Cunningham, Brittney Cunningham,  lives in [place], and works for [employer]. Daughter, Brittney Cunningham, lives in [place], and works for [employer]. Daughter, Brittney Cunningham,  lives in [place], [place], and works as a Statistician. The patient attends [employer].   ADVANCED DIRECTIVES:  HEALTH MAINTENANCE: History  Substance Use Topics  . Smoking status: Former Smoker -- 1.00 packs/day for 20 years    Types: Cigarettes    Quit date: 01/31/1988  . Smokeless tobacco: Never Used  . Alcohol Use: No     Colonoscopy:  PAP:  Bone density: July 2011, Normal  Lipid panel: UTD  Allergies  Allergen Reactions  . Chlorhexidine Hives, Itching and Rash    This was most likely a CONTACT DERMATITIS versus true systemic allergic reaction  . Adhesive (Tape) Hives  . Codeine Swelling    Current Outpatient Prescriptions  Medication Sig Dispense Refill  . aspirin 81 MG chewable tablet Chew 81 mg by mouth daily.      Marland Kitchen atorvastatin (LIPITOR) 40 MG tablet Take 1 tablet (40 mg total) by mouth daily.  90 tablet  1  . carvedilol (COREG) 12.5 MG tablet Take 1 tablet (12.5 mg total) by mouth 2 (two) times daily with a meal.  60 tablet  11  . chlorhexidine (PERIDEX) 0.12 % solution Rinse for 90  seconds  with 15 mLs three times daily. Spit out excess. Do NOT swallow.      . clopidogrel (PLAVIX) 75 MG tablet Take 1 tablet (75 mg total) by mouth daily.  30 tablet  6  . cyanocobalamin (,VITAMIN B-12,) 1000 MCG/ML injection Inject 1,000 mcg into the muscle every 30 (thirty) days. Last dose 03/09/2011      . darbepoetin (ARANESP) 200 MCG/0.4ML SOLN Inject 200 mcg into the skin once. Every 2 weeks      . hydrochlorothiazide (MICROZIDE) 12.5 MG capsule Take 1 capsule (12.5 mg total) by mouth daily.  30 capsule  6  . ibuprofen (ADVIL) 200 MG tablet Take 200 mg by mouth 3 (three) times daily.        . insulin glargine (LANTUS) 100 UNIT/ML injection Inject 60 Units into the  skin at bedtime. Pt wants vials.  30 mL  3  . letrozole (FEMARA) 2.5 MG tablet Take 1 tablet (2.5 mg total) by mouth daily.  30 tablet  11  . levothyroxine (SYNTHROID) 200 MCG tablet Take 1 tablet (200 mcg total) by mouth daily.  30 tablet  11  . levothyroxine (SYNTHROID, LEVOTHROID) 75 MCG tablet Take 75 mcg by mouth daily.      Marland Kitchen losartan (COZAAR) 50 MG tablet Take 1 tablet (50 mg total) by mouth daily.  30 tablet  6  . metFORMIN (GLUCOPHAGE) 500 MG tablet Take 1  tablet (500 mg total) by mouth every morning.  30 tablet  5  . oxyCODONE-acetaminophen (PERCOCET/ROXICET) 5-325 MG per tablet Take 1 tablet by mouth 4 (four) times daily as needed. For pain  120 tablet  0  . pantoprazole (PROTONIX) 40 MG tablet Take 1 tablet (40 mg total) by mouth daily.  30 tablet  3  . ranitidine (ZANTAC) 150 MG tablet Take 1 tablet (150 mg total) by mouth 2 (two) times daily.  30 tablet  3  . sennosides-docusate sodium (SENOKOT-S) 8.6-50 MG tablet 1 or 2 tabs by mouth daily      . [DISCONTINUED] simvastatin (ZOCOR) 40 MG tablet Take 40 mg by mouth every evening.       No current facility-administered medications for this visit.    OBJECTIVE: Elderly white female who appears slightly uncomfortable, presenting in a wheelchair Filed Vitals:   04/09/12 0921  BP: 131/66  Pulse: 73  Temp: 98 F (36.7 C)  Resp: 20     Body mass index is 41.36 kg/(m^2).    ECOG FS: 2  Filed Weights   04/09/12 0921  Weight: 233 lb 6.4 oz (105.87 kg)   No cervical or supraclavicular adenopathy MSK mild swelling is noted in the left upper extremity, nonpitting. Strength is 4/5 in the left upper extremity, 5 out of 5 in the right upper extremity. There is limited range of motion in the left extremity secondary to pain. There is no swelling in the shoulder, left chest wall, or neck area. No obvious deformities or injuries.  The arm and shoulder itself is nontender to palpation.  Lungs clear to auscultation bilaterally crackles,  rhonchi, or wheezes Heart regular rate and rhythm Abdomen soft nontender with positive bowel sounds Neuro: nonfocal, well oriented with positive affect    LAB RESULTS: Lab Results  Component Value Date   WBC 8.7 04/09/2012   NEUTROABS 5.6 04/09/2012   HGB 10.5* 04/09/2012   HCT 32.2* 04/09/2012   MCV 87.9 04/09/2012   PLT 226 04/09/2012      Chemistry      Component Value Date/Time   NA 138 04/09/2012 0908   NA 136 04/03/2012 1250   K 4.4 04/09/2012 0908   K 5.0 04/03/2012 1250   CL 104 04/09/2012 0908   CL 101 04/03/2012 1250   CO2 23 04/09/2012 0908   CO2 22 04/03/2012 1250   BUN 28.6* 04/09/2012 0908   BUN 29* 04/03/2012 1250   CREATININE 1.2* 04/09/2012 0908   CREATININE 1.30* 04/03/2012 1250      Component Value Date/Time   CALCIUM 10.0 04/09/2012 0908   CALCIUM 9.9 04/03/2012 1250   ALKPHOS 99 04/09/2012 0908   ALKPHOS 90 08/11/2011 0853   AST 13 04/09/2012 0908   AST 12 08/11/2011 0853   ALT 15 04/09/2012 0908   ALT 11 08/11/2011 0853   BILITOT 0.50 04/09/2012 0908   BILITOT 0.3 08/11/2011 0853     Results for LINZIE, CRISS (MRN [id number]) as of 12/21/2011 10:18  Ref. Range 06/10/2009 19:41 07/27/2009 08:09 09/12/2011 15:16  Ferritin Latest Range: 10.0-291.0 ng/mL 797 (H) 432 (H) 115.4    Lab Results  Component Value Date   LABCA2 61* 09/28/2011    STUDIES:  CT scan of the abdomen 10/18/13showed no evidence of liver or other parenchymal involvement aside from bone (stable)   Mr Chest Wo Contrast  04/09/2012  *RADIOLOGY REPORT*  Clinical Data: Severe left shoulder pain and weakness and limited range of motion.  Metastatic  breast cancer to bone,  MRI CHEST WITHOUT CONTRAST  Technique:  Multiplanar multisequence MR imaging of the chest was performed. No intravenous contrast was administered.  Comparison: Radiographs of the left shoulder dated 04/03/2012  Findings: The left brachial plexus appears normal.  There are diffuse sclerotic osseous metastases throughout all of the  visualized bones.  The metastases in the proximal humerus, the acromion, and along the superior margin of the scapula are particularly prominent.  The patient does have subacromial/subdeltoid bursitis.  There is no atrophy or edema in the muscles of the rotator cuff. There is no visible Pancoast tumor.  There is evidence of a soft disc protrusion central and slightly to the left at C6-7 which might affect the left C7 nerve.  There is a tiny glenohumeral joint effusion.  Contrast was not utilized because the patient refused further imaging.  IMPRESSION:  1.  Normal left brachial plexus. 2.  Left subacromial/subdeltoid bursitis. 3.  Soft disc protrusion at C6-7 to the left which might affect the left C7 nerve. 4.  No osseous metastases involving all of the visualized bones, particularly prominent in the proximal humerus and in the superior margin of the scapula and in the acromion.   Original Report Authenticated By: Francene Boyers, M.D.    Mr Shoulder Left Wo Contrast  04/09/2012  *RADIOLOGY REPORT*  Clinical Data:  Left shoulder pain and weakness in patient with known metastatic breast cancer.  MRI OF THE LEFT SHOULDER WITHOUT CONTRAST  Technique:  Multiplanar, multisequence MR imaging of the left shoulder was performed.  No intravenous contrast was administered.  Comparison:  Plain films 04/03/2012.  FINDINGS: Rotator cuff:  There is marked thickening and heterogeneously increased T2 signal in the supraspinatus and infraspinatus tendons consistent with tendinopathy.  The tendons are intact.  The subscapularis is unremarkable. Muscles:  Preserved.  No atrophy or focal lesion. Biceps long head:  Intact.  Acromioclavicular Joint:  Fairly bulky acromioclavicular degenerative change is identified. Glenohumeral Joint:  Unremarkable.  Labrum:  Intact. Bones:  Extensive metastatic disease is identified in all visualized bones.  No fracture is identified.  Fluid is present in the subacromial/subdeltoid bursa.  IMPRESSION:   1.  Osseous metastatic disease in all visualized bones.  Negative for fracture. 2.  Supraspinatus and infraspinatus tendinopathy without tear. 3.  Acromioclavicular osteoarthritis and subacromial/subdeltoid bursitis.   Original Report Authenticated By: Holley Dexter, M.D.    Dg Shoulder Left  04/03/2012  *RADIOLOGY REPORT*  Clinical Data: Left arm pain  LEFT SHOULDER - 2+ VIEW  Comparison: None.  Findings: The patient achieves good internal and external rotation. No evidence of fracture or dislocation.  Extensive widespread sclerotic osseous metastatic disease is noted affecting all bones.  IMPRESSION: Widespread osseous metastatic disease.  No fracture or dislocation. No dominant osseous lesion.   Original Report Authenticated By: Paulina Fusi, M.D.    Dg Humerus Left  04/03/2012  *RADIOLOGY REPORT*  Clinical Data: Pain  LEFT HUMERUS - 2+ VIEW  Comparison: Same day shoulder  Findings: Sclerotic changes are noted of the humeral head, scapula and ribs.  Lesser changes more distally in the humerus.  No evidence of fracture or dominant lytic lesion.  IMPRESSION: osseous metastatic disease.  No fracture or dominant lytic lesion.   Original Report Authenticated By: Paulina Fusi, M.D.     Dg Chest 2 View  04/03/2012  *RADIOLOGY REPORT*  Clinical Data: Left arm pain  CHEST - 2 VIEW  Comparison: 02/08/2012  Findings: Chronic interstitial markings.  No focal consolidation.  No pleural effusion or pneumothorax.  Mild cardiomegaly.  Stable right chest port.  Degenerative changes of the visualized thoracolumbar spine.  Widespread sclerotic osseous metastases.  IMPRESSION: No evidence of acute cardiopulmonary disease.  Widespread sclerotic osseous metastases.   Original Report Authenticated By: Charline Bills, M.D.      ASSESSMENT: A 72 year old [place] woman   (1) status post right breast biopsy May of 2011 for a grade 2 invasive ductal carcinoma, T2 NX M1, Stage IV, with bone-only involvement, strongly  estrogen and progesterone receptor-positive, HER-2-negative with an MIB-1 of 26%.   (2) Neoadjuvantly she received letrozole and zoledronic acid beginning in June of 2011 and underwent right modified radical mastectomy February of 2012 for a ypT2 ypN2, grade 1 invasive ductal carcinoma with negative margins.   (3) She completed radiation therapy in August of 2012.   (4) She has continued on letrozole but was switched from zoledronic acid to denosumab Rivka Barbara) because of concerns regarding her serum creatinine. Rivka Barbara was being given every 8 weeks.  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   We are referring Quinetta for an orthopedic evaluation with Dr. Francena Hanly. In the meanwhile, she will continue on her current pain medications, specifically oxycodone/APAP, and will let us know when she needs a refill. She scheduled to return next Monday for labs and injection. She'll be having her mammogram in late March, and we'll see Dr. Darnelle Catalan for brief followup in early April. Hopefully by that time, she will have seen Dr. Rennis Chris, and they can review her treatment plan. She knows to contact us, however, prior to that time if her pain worsens or if the oxycodone is no longer relieving the pain.   AMY BERRY, PA-C 03/25/2012

## 2012-04-09 NOTE — Progress Notes (Signed)
Brittney Cunningham's nonfasting glucose was elevated at 341 today. She does have a history of diabetes. She tells me she was unable to have her medications filled over the weekend due to the inclement weather. She just had these refilled yesterday, and restarted them this morning. She will continue to follow her glucose closely, and will contact her primary care physician if necessary.  Zollie Scale, PA-C 04/09/2012

## 2012-04-09 NOTE — Telephone Encounter (Signed)
Added f/u for 4/3 and gv pt appt schedule for March thru May. Pt also given appt w/Dr. Rennis Chris for 3/12 @ 7:45am.

## 2012-04-13 ENCOUNTER — Emergency Department (HOSPITAL_BASED_OUTPATIENT_CLINIC_OR_DEPARTMENT_OTHER)
Admission: EM | Admit: 2012-04-13 | Discharge: 2012-04-13 | Disposition: A | Discharge status: Home / Self Care | Payer: Medicare Other | Attending: Emergency Medicine | Admitting: Emergency Medicine

## 2012-04-13 ENCOUNTER — Encounter (HOSPITAL_BASED_OUTPATIENT_CLINIC_OR_DEPARTMENT_OTHER): Payer: Self-pay | Admitting: Emergency Medicine

## 2012-04-13 ENCOUNTER — Emergency Department (HOSPITAL_BASED_OUTPATIENT_CLINIC_OR_DEPARTMENT_OTHER): Payer: Medicare Other

## 2012-04-13 DIAGNOSIS — Z794 Long term (current) use of insulin: Secondary | ICD-10-CM | POA: Insufficient documentation

## 2012-04-13 DIAGNOSIS — M1990 Unspecified osteoarthritis, unspecified site: Secondary | ICD-10-CM | POA: Insufficient documentation

## 2012-04-13 DIAGNOSIS — Z87891 Personal history of nicotine dependence: Secondary | ICD-10-CM | POA: Insufficient documentation

## 2012-04-13 DIAGNOSIS — M549 Dorsalgia, unspecified: Secondary | ICD-10-CM | POA: Insufficient documentation

## 2012-04-13 DIAGNOSIS — Z853 Personal history of malignant neoplasm of breast: Secondary | ICD-10-CM | POA: Insufficient documentation

## 2012-04-13 DIAGNOSIS — G4733 Obstructive sleep apnea (adult) (pediatric): Secondary | ICD-10-CM | POA: Insufficient documentation

## 2012-04-13 DIAGNOSIS — Z8659 Personal history of other mental and behavioral disorders: Secondary | ICD-10-CM | POA: Insufficient documentation

## 2012-04-13 DIAGNOSIS — Z7982 Long term (current) use of aspirin: Secondary | ICD-10-CM | POA: Insufficient documentation

## 2012-04-13 DIAGNOSIS — K219 Gastro-esophageal reflux disease without esophagitis: Secondary | ICD-10-CM | POA: Insufficient documentation

## 2012-04-13 DIAGNOSIS — Z862 Personal history of diseases of the blood and blood-forming organs and certain disorders involving the immune mechanism: Secondary | ICD-10-CM | POA: Insufficient documentation

## 2012-04-13 DIAGNOSIS — Z7902 Long term (current) use of antithrombotics/antiplatelets: Secondary | ICD-10-CM | POA: Insufficient documentation

## 2012-04-13 DIAGNOSIS — K5900 Constipation, unspecified: Secondary | ICD-10-CM | POA: Insufficient documentation

## 2012-04-13 DIAGNOSIS — R0602 Shortness of breath: Secondary | ICD-10-CM | POA: Insufficient documentation

## 2012-04-13 DIAGNOSIS — I10 Essential (primary) hypertension: Secondary | ICD-10-CM | POA: Insufficient documentation

## 2012-04-13 DIAGNOSIS — Z79899 Other long term (current) drug therapy: Secondary | ICD-10-CM | POA: Insufficient documentation

## 2012-04-13 DIAGNOSIS — R1083 Colic: Secondary | ICD-10-CM

## 2012-04-13 DIAGNOSIS — E119 Type 2 diabetes mellitus without complications: Secondary | ICD-10-CM | POA: Insufficient documentation

## 2012-04-13 DIAGNOSIS — Z8679 Personal history of other diseases of the circulatory system: Secondary | ICD-10-CM | POA: Insufficient documentation

## 2012-04-13 DIAGNOSIS — R609 Edema, unspecified: Secondary | ICD-10-CM | POA: Insufficient documentation

## 2012-04-13 DIAGNOSIS — I2510 Atherosclerotic heart disease of native coronary artery without angina pectoris: Secondary | ICD-10-CM | POA: Insufficient documentation

## 2012-04-13 DIAGNOSIS — E039 Hypothyroidism, unspecified: Secondary | ICD-10-CM | POA: Insufficient documentation

## 2012-04-13 DIAGNOSIS — E785 Hyperlipidemia, unspecified: Secondary | ICD-10-CM | POA: Insufficient documentation

## 2012-04-13 DIAGNOSIS — R1084 Generalized abdominal pain: Secondary | ICD-10-CM | POA: Insufficient documentation

## 2012-04-13 DIAGNOSIS — Z8739 Personal history of other diseases of the musculoskeletal system and connective tissue: Secondary | ICD-10-CM | POA: Insufficient documentation

## 2012-04-13 DIAGNOSIS — M25519 Pain in unspecified shoulder: Secondary | ICD-10-CM | POA: Insufficient documentation

## 2012-04-13 HISTORY — DX: Bursopathy, unspecified: M71.9

## 2012-04-13 HISTORY — DX: Systemic involvement of connective tissue, unspecified: M35.9

## 2012-04-13 LAB — CBC WITH DIFFERENTIAL/PLATELET
HCT: 37.4 % (ref 36.0–46.0)
Hemoglobin: 12.1 g/dL (ref 12.0–15.0)
Lymphocytes Relative: 21 % (ref 12–46)
Lymphs Abs: 2.5 10*3/uL (ref 0.7–4.0)
Monocytes Absolute: 1.1 10*3/uL — ABNORMAL HIGH (ref 0.1–1.0)
Monocytes Relative: 10 % (ref 3–12)
Neutro Abs: 7.6 10*3/uL (ref 1.7–7.7)
WBC: 11.5 10*3/uL — ABNORMAL HIGH (ref 4.0–10.5)

## 2012-04-13 LAB — COMPREHENSIVE METABOLIC PANEL
ALT: 17 U/L (ref 0–35)
Albumin: 3.8 g/dL (ref 3.5–5.2)
BUN: 29 mg/dL — ABNORMAL HIGH (ref 6–23)
Calcium: 9.5 mg/dL (ref 8.4–10.5)
GFR calc Af Amer: 74 mL/min — ABNORMAL LOW (ref >90)
Glucose, Bld: 459 mg/dL — ABNORMAL HIGH (ref 70–99)
Sodium: 135 mEq/L (ref 135–145)
Total Protein: 8 g/dL (ref 6.0–8.3)

## 2012-04-13 LAB — GLUCOSE, CAPILLARY
Glucose-Capillary: 416 mg/dL — ABNORMAL HIGH (ref 70–99)
Glucose-Capillary: 207 mg/dL — ABNORMAL HIGH (ref 70–99)

## 2012-04-13 MED ORDER — DOCUSATE SODIUM 100 MG PO CAPS: 100.0000 mg | ORAL_CAPSULE | Freq: Two times a day (BID) | ORAL | Status: DC

## 2012-04-13 MED ORDER — INSULIN ASPART 100 UNIT/ML IV SOLN
10.0000 [IU] | Freq: Once | INTRAVENOUS | Status: AC
  Administered 2012-04-13: 10 [IU] via INTRAVENOUS
  Filled 2012-04-13: qty 1
  Filled 2012-04-13: qty 0.1

## 2012-04-13 MED ORDER — HYDROMORPHONE HCL PF 1 MG/ML IJ SOLN
1.0000 mg | Freq: Once | INTRAMUSCULAR | Status: AC
  Administered 2012-04-13: 1 mg via INTRAVENOUS
  Filled 2012-04-13: qty 1

## 2012-04-13 MED ORDER — DICYCLOMINE HCL 10 MG PO CAPS
10.0000 mg | ORAL_CAPSULE | Freq: Once | ORAL | Status: AC
  Administered 2012-04-13: 10 mg via ORAL
  Filled 2012-04-13: qty 1

## 2012-04-13 MED ORDER — DICYCLOMINE HCL 20 MG PO TABS: 20.0000 mg | ORAL_TABLET | Freq: Two times a day (BID) | ORAL | Status: DC

## 2012-04-13 MED ORDER — GLYCERIN (LAXATIVE) 2 G RE SUPP: 1.0000 | Freq: Once | RECTAL | Status: AC | PRN

## 2012-04-13 MED ORDER — IOHEXOL 300 MG/ML  SOLN
50.0000 mL | Freq: Once | INTRAMUSCULAR | Status: AC | PRN
  Administered 2012-04-13: 50 mL via ORAL

## 2012-04-13 MED ORDER — IOHEXOL 300 MG/ML  SOLN
100.0000 mL | Freq: Once | INTRAMUSCULAR | Status: AC | PRN
  Administered 2012-04-13: 100 mL via INTRAVENOUS

## 2012-04-13 NOTE — ED Notes (Signed)
IV removed.

## 2012-04-13 NOTE — ED Notes (Signed)
Pt having generalized chest pain since last night.  Pain also in abdomen and back.  Pt has sob when pain flares.  No N/v/D.  No known fever.  Recently received cortisone injection for left shoulder.

## 2012-04-13 NOTE — ED Provider Notes (Signed)
History  This chart was scribed for Loren Racer, MD by Shari Heritage, ED Scribe. The patient was seen in room MH02/MH02. Patient's care was started at 1835.  CSN: [id number]  Arrival date & time 04/13/12  6578   First MD Initiated Contact with Patient 04/13/12 1835      Chief Complaint  Patient presents with  . Abdominal Pain     Patient is a 72 y.o. female presenting with abdominal pain.  Abdominal Pain Pain location:  Generalized Pain radiates to:  Does not radiate Pain severity:  Moderate Onset quality:  Sudden Timing:  Constant Progression:  Waxing and waning Chronicity:  New Associated symptoms: shortness of breath   Associated symptoms: no chills, no diarrhea, no fever, no nausea and no vomiting      HPI Comments: Brittney Cunningham is a 72 y.o. female with history of Stage IV breast cancer, diabetes, coronary artery disease, hypertension who presents to the Emergency Department complaining of waxing and waning, moderate, diffuse abdominal pain onset last night. She has shortness of breath with intense pain flare ups. She states that she feels like there is a "band" stretching across her abdomen. Patient also reports bilateral shoulder pain and back pain. She has baseline lower extremity edema, but no new swelling. Her last bowel movement was yesterday which she states was normal. Patient denies nausea, vomiting, diarrhea, fever or chills. She states that she has been taking Senokot and metamucil regularly which have helped maintain regular bowel movements. Patient is followed at [facility].    Past Medical History  Diagnosis Date  . Anemia   . Depression   . Diabetes mellitus type II   . GERD (gastroesophageal reflux disease)   . Hyperlipidemia   . Hypothyroidism   . Osteoarthritis   . Carotid artery stenosis     bilateral  . CAD (coronary artery disease)   . OSA (obstructive sleep apnea)   . Breast cancer     right breast cancer-invasive  ductal  carcinoma (StageIV)  . Hypertension   . Anxiety   . Carotid artery occlusion   . Unspecified constipation 03/15/2012  . Bursitis     Past Surgical History  Procedure Laterality Date  . Incisional breast biopsy      remoted left breast biopsy  . Cesarean section    . Other surgical history      GYN surgery  . Knee surgery      right knee surgery  . Carotid endarterectomy      right carotid  . Port-a-cath removal    . Modified mastectomy      Right breast  . Breast surgery    . Mastectomy    . Carotid stent      left    Family History  Problem Relation Age of Onset  . Arthritis    . Coronary artery disease      first degree relative  . Stroke      first degree relative  . Diabetes Mother   . Heart disease Mother   . Hypertension Mother   . Colon cancer Neg Hx   . Stomach cancer Neg Hx     History  Substance Use Topics  . Smoking status: Former Smoker -- 1.00 packs/day for 20 years    Types: Cigarettes    Quit date: 01/31/1988  . Smokeless tobacco: Never Used  . Alcohol Use: No    OB History   Grav Para Term Preterm Abortions TAB SAB Ect Mult  Living                  Review of Systems  Constitutional: Negative for fever and chills.  Respiratory: Positive for shortness of breath.   Gastrointestinal: Positive for abdominal pain. Negative for nausea, vomiting and diarrhea.  Musculoskeletal: Positive for myalgias and back pain.  All other systems reviewed and are negative.    Allergies  Chlorhexidine; Adhesive; and Codeine  Home Medications   Current Outpatient Rx  Name  Route  Sig  Dispense  Refill  . aspirin 81 MG chewable tablet   Oral   Chew 81 mg by mouth daily.         Marland Kitchen atorvastatin (LIPITOR) 40 MG tablet   Oral   Take 1 tablet (40 mg total) by mouth daily.   90 tablet   1   . carvedilol (COREG) 12.5 MG tablet   Oral   Take 1 tablet (12.5 mg total) by mouth 2 (two) times daily with a meal.   60 tablet   11   . chlorhexidine  (PERIDEX) 0.12 % solution      Rinse for 90  seconds  with 15 mLs three times daily. Spit out excess. Do NOT swallow.         . clopidogrel (PLAVIX) 75 MG tablet   Oral   Take 1 tablet (75 mg total) by mouth daily.   30 tablet   6   . cyanocobalamin (,VITAMIN B-12,) 1000 MCG/ML injection   Intramuscular   Inject 1,000 mcg into the muscle every 30 (thirty) days. Last dose 03/09/2011         . darbepoetin (ARANESP) 200 MCG/0.4ML SOLN   Subcutaneous   Inject 200 mcg into the skin once. Every 2 weeks         . hydrochlorothiazide (MICROZIDE) 12.5 MG capsule   Oral   Take 1 capsule (12.5 mg total) by mouth daily.   30 capsule   6   . ibuprofen (ADVIL) 200 MG tablet   Oral   Take 200 mg by mouth 3 (three) times daily.           . insulin glargine (LANTUS) 100 UNIT/ML injection   Subcutaneous   Inject 60 Units into the skin at bedtime. Pt wants vials.   30 mL   3   . letrozole (FEMARA) 2.5 MG tablet   Oral   Take 1 tablet (2.5 mg total) by mouth daily.   30 tablet   11   . levothyroxine (SYNTHROID) 200 MCG tablet   Oral   Take 1 tablet (200 mcg total) by mouth daily.   30 tablet   11   . levothyroxine (SYNTHROID, LEVOTHROID) 75 MCG tablet   Oral   Take 75 mcg by mouth daily.         Marland Kitchen losartan (COZAAR) 50 MG tablet   Oral   Take 1 tablet (50 mg total) by mouth daily.   30 tablet   6   . metFORMIN (GLUCOPHAGE) 500 MG tablet   Oral   Take 1 tablet (500 mg total) by mouth every morning.   30 tablet   5   . oxyCODONE-acetaminophen (PERCOCET/ROXICET) 5-325 MG per tablet   Oral   Take 1 tablet by mouth 4 (four) times daily as needed. For pain   120 tablet   0   . pantoprazole (PROTONIX) 40 MG tablet   Oral   Take 1 tablet (40 mg total) by mouth daily.  30 tablet   3   . ranitidine (ZANTAC) 150 MG tablet   Oral   Take 1 tablet (150 mg total) by mouth 2 (two) times daily.   30 tablet   3   . sennosides-docusate sodium (SENOKOT-S) 8.6-50 MG  tablet      1 or 2 tabs by mouth daily           LMP 03/13/2012  Physical Exam  Constitutional: She is oriented to person, place, and time. She appears well-developed and well-nourished.  HENT:  Head: Normocephalic and atraumatic.  Neck: Normal range of motion. Neck supple.  Cardiovascular: Normal rate, regular rhythm and normal heart sounds.   Pulmonary/Chest: Effort normal and breath sounds normal. She exhibits no tenderness.  Abdominal: She exhibits distension. There is tenderness. There is no rebound and no guarding.  Diffuse abdominal tenderness. Mild distension.   Neurological: She is alert and oriented to person, place, and time.  Skin: Skin is warm and dry.  Psychiatric: She has a normal mood and affect. Her behavior is normal.    ED Course  Procedures (including critical care time)   COORDINATION OF CARE: 6:39 PM- Patient here complaining of diffuse abdominal pain. Initial abs indicate elevated glucose. Will give novoLOG and Dilauid. Patient informed of current plan for treatment and evaluation and agrees with plan at this time.   9:15 PM- Will order additional dose of Dilaudid 1 mg.   9:55 PM- Updated patient on results of labs and imaging studies. Have ordered Bentyl 10 mg.  Symptoms, studies and exam are consistent with abdominal colic. Will discharge with follow up instructions. Patient verbalizes understand and agrees.    Labs Reviewed  GLUCOSE, CAPILLARY - Abnormal; Notable for the following:    Glucose-Capillary 416 (*)    All other components within normal limits  COMPREHENSIVE METABOLIC PANEL - Abnormal; Notable for the following:    Glucose, Bld 459 (*)    BUN 29 (*)    Alkaline Phosphatase 134 (*)    Total Bilirubin 0.2 (*)    GFR calc non Af Amer 64 (*)    GFR calc Af Amer 74 (*)    All other components within normal limits  CBC WITH DIFFERENTIAL - Abnormal; Notable for the following:    WBC 11.5 (*)    Monocytes Absolute 1.1 (*)    All other  components within normal limits  LIPASE, BLOOD  TROPONIN I  URINALYSIS, ROUTINE W REFLEX MICROSCOPIC    Dg Chest 2 View  04/13/2012  *RADIOLOGY REPORT*  Clinical Data: Chest pain.  Pain under both breasts since 3 o'clock this morning.  CHEST - 2 VIEW  Comparison: 04/03/2012  Findings: The patient has a right-sided Port-A-Cath, tip to the level of the superior vena cava.  The heart is mildly enlarged. There is prominence of interstitial markings, stable in appearance. There is minimal left lower lobe atelectasis or early infiltrate. Degenerative changes are seen in the spine. There is patchy appearance of the bones, consistent with metastatic disease.  IMPRESSION:  1.  Cardiomegaly. 2. Left lower lobe infiltrate versus atelectasis.   Original Report Authenticated By: Norva Pavlov, M.D.    Ct Abdomen Pelvis W Contrast  04/13/2012  *RADIOLOGY REPORT*  Clinical Data: Chest pain.  History of breast cancer 2 years ago. Diabetes.  Abdominal pain.  CT ABDOMEN AND PELVIS WITH CONTRAST  Technique:  Multidetector CT imaging of the abdomen and pelvis was performed following the standard protocol during bolus administration of intravenous contrast.  Contrast: 50mL OMNIPAQUE IOHEXOL 300 MG/ML  SOLN, OMNIPAQUE IOHEXOL 300 MG/ML  SOLN  Comparison: 11/20/2011  Findings: Postoperative changes in the right chest wall and probable postradiation changes in the right lung base anteriorly. Focal nodular opacity in the anterior right lung may represent scarring and is stable since previous study.  This is measured at 7 mm.  Coronary artery calcification.  Small esophageal hiatal hernia.  The tiny lesion previously demonstrated in the left lobe of the liver is not definitely appreciated today.  No focal liver lesions are identified today.  The gallbladder is contracted, likely physiologic.  The pancreas is diffusely fatty infiltrated.  The spleen, adrenal glands, kidneys, and retroperitoneal lymph nodes are unremarkable.   The stomach, small bowel, and colon are not abnormally distended.  Diffusely stool filled colon.  No free air or free fluid in the abdomen.  Calcification of the abdominal aorta without aneurysm.  Pelvis:  The uterus and adnexal structures are not enlarged.  The bladder wall is not thickened.  No free or loculated pelvic fluid collections.  No significant pelvic lymphadenopathy.  No evidence of diverticulitis.  The appendix is not identified.  Diffuse pattern of mixed sclerosis and lucency throughout the visualized bones including vertebrae, pelvis, sacrum, and hips consistent with diffuse skeletal metastasis.  This is stable since previous study.  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  Postoperative and post radiation changes in the right anterior chest. Diffuse fatty atrophy of the pancreas.  No acute process demonstrated in the abdomen or pelvis.   Original Report Authenticated By: Burman Nieves, M.D.      1. Abdominal colic      Date: 04/13/2012  Rate: 86  Rhythm: normal sinus rhythm  QRS Axis: normal  Intervals: normal  ST/T Wave abnormalities: normal  Conduction Disutrbances:none  Narrative Interpretation:   Old EKG Reviewed: unchanged    MDM  I personally performed the services described in this documentation, which was scribed in my presence. The recorded information has been reviewed and is accurate.  Adequately screened for CAD though symptoms seem more upper abdominal and episodic in nature. Moderate stool load in transverse colon. Suspect abdominal colic. Will start bentyl and stool softener. Advised to f/u with PMD.   Loren Racer, MD 04/13/12 [id number]

## 2012-04-15 ENCOUNTER — Other Ambulatory Visit: Payer: Self-pay | Admitting: Lab

## 2012-04-17 ENCOUNTER — Other Ambulatory Visit: Payer: Self-pay | Admitting: *Deleted

## 2012-04-17 DIAGNOSIS — C50911 Malignant neoplasm of unspecified site of right female breast: Secondary | ICD-10-CM

## 2012-04-17 MED ORDER — OXYCODONE-ACETAMINOPHEN 5-325 MG PO TABS: 1.0000 | ORAL_TABLET | Freq: Four times a day (QID) | ORAL | Status: DC | PRN

## 2012-04-26 ENCOUNTER — Ambulatory Visit
Admission: RE | Admit: 2012-04-26 | Discharge: 2012-04-26 | Disposition: A | Discharge status: Home / Self Care | Payer: Medicare Other | Source: Ambulatory Visit | Attending: Physician Assistant | Admitting: Physician Assistant

## 2012-04-26 DIAGNOSIS — Z853 Personal history of malignant neoplasm of breast: Secondary | ICD-10-CM

## 2012-04-29 ENCOUNTER — Other Ambulatory Visit (HOSPITAL_BASED_OUTPATIENT_CLINIC_OR_DEPARTMENT_OTHER): Payer: Medicare Other | Admitting: Lab

## 2012-04-29 ENCOUNTER — Ambulatory Visit (HOSPITAL_BASED_OUTPATIENT_CLINIC_OR_DEPARTMENT_OTHER): Payer: Medicare Other

## 2012-04-29 VITALS — BP 101/77 | HR 125 | Temp 98.1°F

## 2012-04-29 DIAGNOSIS — C50919 Malignant neoplasm of unspecified site of unspecified female breast: Secondary | ICD-10-CM

## 2012-04-29 DIAGNOSIS — D649 Anemia, unspecified: Secondary | ICD-10-CM

## 2012-04-29 DIAGNOSIS — C7951 Secondary malignant neoplasm of bone: Secondary | ICD-10-CM

## 2012-04-29 DIAGNOSIS — E538 Deficiency of other specified B group vitamins: Secondary | ICD-10-CM

## 2012-04-29 DIAGNOSIS — D630 Anemia in neoplastic disease: Secondary | ICD-10-CM

## 2012-04-29 DIAGNOSIS — K219 Gastro-esophageal reflux disease without esophagitis: Secondary | ICD-10-CM

## 2012-04-29 LAB — CBC WITH DIFFERENTIAL/PLATELET
Basophils Absolute: 0 10*3/uL (ref 0.0–0.1)
EOS%: 4.7 % (ref 0.0–7.0)
Eosinophils Absolute: 0.3 10*3/uL (ref 0.0–0.5)
HGB: 10.2 g/dL — ABNORMAL LOW (ref 11.6–15.9)
MCH: 28.8 pg (ref 25.1–34.0)
NEUT#: 4.4 10*3/uL (ref 1.5–6.5)
RDW: 15.7 % — ABNORMAL HIGH (ref 11.2–14.5)
WBC: 7.1 10*3/uL (ref 3.9–10.3)
lymph#: 1.9 10*3/uL (ref 0.9–3.3)

## 2012-04-29 LAB — FERRITIN: Ferritin: 486 ng/mL — ABNORMAL HIGH (ref 10–291)

## 2012-04-29 MED ORDER — DARBEPOETIN ALFA-POLYSORBATE 200 MCG/0.4ML IJ SOLN
200.0000 ug | Freq: Once | INTRAMUSCULAR | Status: AC
  Administered 2012-04-29: 200 ug via SUBCUTANEOUS
  Filled 2012-04-29: qty 0.4

## 2012-04-29 MED ORDER — CYANOCOBALAMIN 1000 MCG/ML IJ SOLN
1000.0000 ug | Freq: Once | INTRAMUSCULAR | Status: AC
  Administered 2012-04-29: 1000 ug via INTRAMUSCULAR

## 2012-04-29 NOTE — Patient Instructions (Addendum)
Cyanocobalamin, Vitamin B12 injection What is this medicine? CYANOCOBALAMIN (sye an oh koe BAL a min) is a man made form of vitamin B12. Vitamin B12 is used in the growth of healthy blood cells, nerve cells, and proteins in the body. It also helps with the metabolism of fats and carbohydrates. This medicine is used to treat people who can not absorb vitamin B12. This medicine may be used for other purposes; ask your health care provider or pharmacist if you have questions. What should I tell my health care provider before I take this medicine? They need to know if you have any of these conditions: -kidney disease -Leber's disease -megaloblastic anemia -an unusual or allergic reaction to cyanocobalamin, cobalt, other medicines, foods, dyes, or preservatives -pregnant or trying to get pregnant -breast-feeding How should I use this medicine? This medicine is injected into a muscle or deeply under the skin. It is usually given by a health care professional in a clinic or doctor's office. However, your doctor may teach you how to inject yourself. Follow all instructions. Talk to your pediatrician regarding the use of this medicine in children. Special care may be needed. Overdosage: If you think you have taken too much of this medicine contact a poison control center or emergency room at once. NOTE: This medicine is only for you. Do not share this medicine with others. What if I miss a dose? If you are given your dose at a clinic or doctor's office, call to reschedule your appointment. If you give your own injections and you miss a dose, take it as soon as you can. If it is almost time for your next dose, take only that dose. Do not take double or extra doses. What may interact with this medicine? -colchicine -heavy alcohol intake This list may not describe all possible interactions. Give your health care provider a list of all the medicines, herbs, non-prescription drugs, or dietary supplements you  use. Also tell them if you smoke, drink alcohol, or use illegal drugs. Some items may interact with your medicine. What should I watch for while using this medicine? Visit your doctor or health care professional regularly. You may need blood work done while you are taking this medicine. You may need to follow a special diet. Talk to your doctor. Limit your alcohol intake and avoid smoking to get the best benefit. What side effects may I notice from receiving this medicine? Side effects that you should report to your doctor or health care professional as soon as possible: -allergic reactions like skin rash, itching or hives, swelling of the face, lips, or tongue -blue tint to skin -chest tightness, pain -difficulty breathing, wheezing -dizziness -red, swollen painful area on the leg Side effects that usually do not require medical attention (report to your doctor or health care professional if they continue or are bothersome): -diarrhea -headache This list may not describe all possible side effects. Call your doctor for medical advice about side effects. You may report side effects to FDA at 1-800-FDA-1088. Where should I keep my medicine? Keep out of the reach of children. Store at room temperature between 15 and 30 degrees C (59 and 85 degrees F). Protect from light. Throw away any unused medicine after the expiration date. NOTE: This sheet is a summary. It may not cover all possible information. If you have questions about this medicine, talk to your doctor, pharmacist, or health care provider.  2013, [employer]. (04/29/2007 10:10:20 PM) Darbepoetin Alfa injection What is this medicine?  DARBEPOETIN ALFA (dar be POE e tin AL fa) helps your body make more red blood cells. It is used to treat anemia caused by chronic kidney failure and chemotherapy. This medicine may be used for other purposes; ask your health care provider or pharmacist if you have questions. What should I tell  my health care provider before I take this medicine? They need to know if you have any of these conditions: -blood clotting disorders or history of blood clots -cancer patient not on chemotherapy -cystic fibrosis -heart disease, such as angina, heart failure, or a history of a heart attack -hemoglobin level of 12 g/dL or greater -high blood pressure -low levels of folate, iron, or vitamin B12 -seizures -an unusual or allergic reaction to darbepoetin, erythropoietin, albumin, hamster proteins, latex, other medicines, foods, dyes, or preservatives -pregnant or trying to get pregnant -breast-feeding How should I use this medicine? This medicine is for injection into a vein or under the skin. It is usually given by a health care professional in a hospital or clinic setting. If you get this medicine at home, you will be taught how to prepare and give this medicine. Do not shake the solution before you withdraw a dose. Use exactly as directed. Take your medicine at regular intervals. Do not take your medicine more often than directed. It is important that you put your used needles and syringes in a special sharps container. Do not put them in a trash can. If you do not have a sharps container, call your pharmacist or healthcare provider to get one. Talk to your pediatrician regarding the use of this medicine in children. While this medicine may be used in children as young as 1 year for selected conditions, precautions do apply. Overdosage: If you think you have taken too much of this medicine contact a poison control center or emergency room at once. NOTE: This medicine is only for you. Do not share this medicine with others. What if I miss a dose? If you miss a dose, take it as soon as you can. If it is almost time for your next dose, take only that dose. Do not take double or extra doses. What may interact with this medicine? Do not take this medicine with any of the following  medications: -epoetin alfa This list may not describe all possible interactions. Give your health care provider a list of all the medicines, herbs, non-prescription drugs, or dietary supplements you use. Also tell them if you smoke, drink alcohol, or use illegal drugs. Some items may interact with your medicine. What should I watch for while using this medicine? Visit your prescriber or health care professional for regular checks on your progress and for the needed blood tests and blood pressure measurements. It is especially important for the doctor to make sure your hemoglobin level is in the desired range, to limit the risk of potential side effects and to give you the best benefit. Keep all appointments for any recommended tests. Check your blood pressure as directed. Ask your doctor what your blood pressure should be and when you should contact him or her. As your body makes more red blood cells, you may need to take iron, folic acid, or vitamin B supplements. Ask your doctor or health care provider which products are right for you. If you have kidney disease continue dietary restrictions, even though this medication can make you feel better. Talk with your doctor or health care professional about the foods you eat and the vitamins  that you take. What side effects may I notice from receiving this medicine? Side effects that you should report to your doctor or health care professional as soon as possible: -allergic reactions like skin rash, itching or hives, swelling of the face, lips, or tongue -breathing problems -changes in vision -chest pain -confusion, trouble speaking or understanding -feeling faint or lightheaded, falls -high blood pressure -muscle aches or pains -pain, swelling, warmth in the leg -rapid weight gain -severe headaches -sudden numbness or weakness of the face, arm or leg -trouble walking, dizziness, loss of balance or coordination -seizures (convulsions) -swelling of  the ankles, feet, hands -unusually weak or tired Side effects that usually do not require medical attention (report to your doctor or health care professional if they continue or are bothersome): -diarrhea -fever, chills (flu-like symptoms) -headaches -nausea, vomiting -redness, stinging, or swelling at site where injected This list may not describe all possible side effects. Call your doctor for medical advice about side effects. You may report side effects to FDA at 1-800-FDA-1088. Where should I keep my medicine? Keep out of the reach of children. Store in a refrigerator between 2 and 8 degrees C (36 and 46 degrees F). Do not freeze. Do not shake. Throw away any unused portion if using a single-dose vial. Throw away any unused medicine after the expiration date. NOTE: This sheet is a summary. It may not cover all possible information. If you have questions about this medicine, talk to your doctor, pharmacist, or health care provider.  2013, [employer]. (12/31/2007 10:23:57 AM)

## 2012-05-02 ENCOUNTER — Ambulatory Visit (HOSPITAL_BASED_OUTPATIENT_CLINIC_OR_DEPARTMENT_OTHER): Payer: Medicare Other | Admitting: Oncology

## 2012-05-02 ENCOUNTER — Telehealth: Payer: Self-pay | Admitting: Oncology

## 2012-05-02 VITALS — BP 155/75 | HR 69 | Temp 98.2°F | Resp 20 | Ht 63.0 in | Wt 234.3 lb

## 2012-05-02 DIAGNOSIS — C50911 Malignant neoplasm of unspecified site of right female breast: Secondary | ICD-10-CM

## 2012-05-02 DIAGNOSIS — C50919 Malignant neoplasm of unspecified site of unspecified female breast: Secondary | ICD-10-CM

## 2012-05-02 DIAGNOSIS — C7952 Secondary malignant neoplasm of bone marrow: Secondary | ICD-10-CM

## 2012-05-02 NOTE — Telephone Encounter (Signed)
gv pt appt schedule for April thru June. °

## 2012-05-02 NOTE — Progress Notes (Signed)
ID: Brittney Cunningham   DOB: Jan 18, 1941  MR#: [id number]  XBM#:[id number]  PCP: Letitia Libra, Ala Dach, MD GYN: SU: Emelia Loron OTHER MD: Cindra Eves, Dorothy Puffer, Caryn Bee Supple  HISTORY OF PRESENT ILLNESS: Brittney Cunningham was diagnosed with right breast carcinoma in May of 2011, a biopsy showing a grade 2 invasive ductal carcinoma, T2 NXM1, stage IV at presentation. There was bone only involvement. Tumor was strongly ER and PR positive, HER-2/neu negative, with an MIP-1 of 26%.  She was treated neoadjuvantly with letrozole and zoledronic acid beginning in June of 2011. She is status post right modified radical mastectomy in February 2012 for a ypT2 ypN2, grade 1 invasive ductal carcinoma. Margins were negative. Her subsequent history is as detailed below  INTERVAL HISTORY: Brittney Cunningham returns today for followup of her history of breast cancer. She has had orthopedic evaluation of her left shoulder and arm pain which has helped relieved her pain. In addition, she is developed abdominal cramps, associated with constipation. This is improved with Bentyl and Colace.  REVIEW OF SYSTEMS: Otherwise she is tolerating the letrozole with no obvious side effects. She describes herself is moderately fatigued. 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 She has moderate GERD. She tells me her appetite is good. She has difficulty walking and significant pain in her back and joint. She has minimal hot flashes. A detailed review of systems today was otherwise stable  PAST MEDICAL HISTORY: Past Medical History  Diagnosis Date  . Anemia   . Depression   . Diabetes mellitus type II   . GERD (gastroesophageal reflux disease)   . Hyperlipidemia   . Hypothyroidism   . Osteoarthritis   . Carotid artery stenosis     bilateral  . CAD (coronary artery disease)   . OSA (obstructive sleep apnea)   . Breast cancer     right breast cancer-invasive  ductal  carcinoma (StageIV)  . Hypertension   . Anxiety   . Carotid artery occlusion   . Unspecified constipation 03/15/2012  . Bursitis   . Collagen vascular disease     PAST SURGICAL HISTORY: Past Surgical History  Procedure Laterality Date  . Incisional breast biopsy      remoted left breast biopsy  . Cesarean section    . Other surgical history      GYN surgery  . Knee surgery      right knee surgery  . Carotid endarterectomy      right carotid  . Port-a-cath removal    . Modified mastectomy      Right breast  . Breast surgery    . Mastectomy    . Carotid stent      left    FAMILY HISTORY Family History  Problem Relation Age of Onset  . Arthritis    . Coronary artery disease      first degree relative  . Stroke      first degree relative  . Diabetes Mother   . Heart disease Mother   . Hypertension Mother   . Colon cancer Neg Hx   . Stomach cancer Neg Hx   The patient's father died from pancreatic cancer at the age of 37. The patient's mother died from complications of diabetes and heart disease at the age of 105. The patient has 2 sisters and 2 brothers. There is no history of breast cancer or ovarian cancer in the immediate family. One of the patient's paternal  aunts out of 6 paternal aunts had breast cancer diagnosed in her 91s.    GYNECOLOGIC HISTORY: The patient is GX, P3. First pregnancy to term age 78. She went through the change of life in her late 84s. She never took hormones.   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 Her husband,, [name], is disabled secondary to a fall. He has difficulty with walking. He used to work for [employer] previously. The patient's son, [name],  lives in [place], and works for [employer]. Daughter, [name], lives in [place], and works for [employer]. Daughter, [name],  lives in [place], [place], and works as a Statistician. The patient attends [employer].   ADVANCED DIRECTIVES:  HEALTH MAINTENANCE: History  Substance Use Topics  . Smoking status: Former Smoker -- 1.00 packs/day for 20 years    Types: Cigarettes    Quit date: 01/31/1988  . Smokeless tobacco: Never Used  . Alcohol Use: No     Colonoscopy:  PAP:  Bone density: July 2011, Normal  Lipid panel: UTD  Allergies  Allergen Reactions  . Chlorhexidine Hives, Itching and Rash    This was most likely a CONTACT DERMATITIS versus true systemic allergic reaction  . Adhesive (Tape) Hives  . Codeine Swelling    Current Outpatient Prescriptions  Medication Sig Dispense Refill  . aspirin 81 MG chewable tablet Chew 81 mg by mouth daily.      Marland Kitchen atorvastatin (LIPITOR) 40 MG tablet Take 1 tablet (40 mg total) by mouth daily.  90 tablet  1  . carvedilol (COREG) 12.5 MG tablet Take 1 tablet (12.5 mg total) by mouth 2 (two) times daily with a meal.  60 tablet  11  . chlorhexidine (PERIDEX) 0.12 % solution Rinse for 90  seconds  with 15 mLs three times daily. Spit out excess. Do NOT swallow.      . clopidogrel (PLAVIX) 75 MG tablet Take 1 tablet (75 mg total) by mouth daily.  30 tablet  6  . cyanocobalamin (,VITAMIN B-12,) 1000 MCG/ML injection Inject 1,000 mcg into the muscle every 30 (thirty) days. Last dose 03/09/2011      . darbepoetin (ARANESP) 200 MCG/0.4ML SOLN Inject 200 mcg into the skin once. Every 2 weeks      . dicyclomine (BENTYL) 20 MG tablet Take 1 tablet (20 mg total) by mouth 2 (two) times daily.  20 tablet  0  . docusate sodium (COLACE) 100 MG capsule Take 1 capsule (100 mg total) by mouth every 12 (twelve) hours.  60 capsule  0  . glycerin adult (GLYCERIN ADULT) 2 G SUPP Place 1 suppository rectally once as needed (constipation).  10 suppository  0  . hydrochlorothiazide (MICROZIDE) 12.5 MG capsule Take 1 capsule (12.5 mg total) by mouth daily.  30 capsule  6  . ibuprofen (ADVIL) 200 MG tablet Take 200 mg by mouth 3 (three) times daily.        . insulin glargine  (LANTUS) 100 UNIT/ML injection Inject 60 Units into the skin at bedtime. Pt wants vials.  30 mL  3  . letrozole (FEMARA) 2.5 MG tablet Take 1 tablet (2.5 mg total) by mouth daily.  30 tablet  11  . levothyroxine (SYNTHROID) 200 MCG tablet Take 1 tablet (200 mcg total) by mouth daily.  30 tablet  11  . levothyroxine (SYNTHROID, LEVOTHROID) 75 MCG tablet Take 75 mcg by mouth daily.      Marland Kitchen losartan (COZAAR) 50  MG tablet Take 1 tablet (50 mg total) by mouth daily.  30 tablet  6  . metFORMIN (GLUCOPHAGE) 500 MG tablet Take 1 tablet (500 mg total) by mouth every morning.  30 tablet  5  . oxyCODONE-acetaminophen (PERCOCET/ROXICET) 5-325 MG per tablet Take 1 tablet by mouth 4 (four) times daily as needed. For pain  120 tablet  0  . pantoprazole (PROTONIX) 40 MG tablet Take 1 tablet (40 mg total) by mouth daily.  30 tablet  3  . ranitidine (ZANTAC) 150 MG tablet Take 1 tablet (150 mg total) by mouth 2 (two) times daily.  30 tablet  3  . sennosides-docusate sodium (SENOKOT-S) 8.6-50 MG tablet 1 or 2 tabs by mouth daily      . [DISCONTINUED] simvastatin (ZOCOR) 40 MG tablet Take 40 mg by mouth every evening.       No current facility-administered medications for this visit.    OBJECTIVE: Elderly white female who moves with difficulty  Filed Vitals:   05/02/12 1513  BP: 155/75  Pulse: 69  Temp: 98.2 F (36.8 C)  Resp: 20     Body mass index is 41.51 kg/(m^2).    ECOG FS: 2  Filed Weights   05/02/12 1513  Weight: 234 lb 4.8 oz (106.278 kg)   Oropharynx is clear, with evidence of continuing nonhealing right mandibular socket lesions No cervical or supraclavicular adenopathy Lungs show no crackles, rhonchi, or wheezes Heart regular rate and rhythm Abdomen soft nontender with positive bowel sounds Neuro: nonfocal, well oriented with positive affect  Breasts: Status post right modified radical mastectomy, with no evidence of local recurrence. The right axilla is benign. The left breast is  unremarkable.  LAB RESULTS: Lab Results  Component Value Date   WBC 7.1 04/29/2012   NEUTROABS 4.4 04/29/2012   HGB 10.2* 04/29/2012   HCT 31.5* 04/29/2012   MCV 89.3 04/29/2012   PLT 307 04/29/2012      Chemistry      Component Value Date/Time   NA 135 04/13/2012 1909   NA 138 04/09/2012 0908   K 4.5 04/13/2012 1909   K 4.4 04/09/2012 0908   CL 99 04/13/2012 1909   CL 104 04/09/2012 0908   CO2 24 04/13/2012 1909   CO2 23 04/09/2012 0908   BUN 29* 04/13/2012 1909   BUN 28.6* 04/09/2012 0908   CREATININE 0.90 04/13/2012 1909   CREATININE 1.2* 04/09/2012 0908      Component Value Date/Time   CALCIUM 9.5 04/13/2012 1909   CALCIUM 10.0 04/09/2012 0908   ALKPHOS 134* 04/13/2012 1909   ALKPHOS 99 04/09/2012 0908   AST 28 04/13/2012 1909   AST 13 04/09/2012 0908   ALT 17 04/13/2012 1909   ALT 15 04/09/2012 0908   BILITOT 0.2* 04/13/2012 1909   BILITOT 0.50 04/09/2012 0908     Results for Brittney Cunningham, Brittney Cunningham (MRN [id number]) as of 12/21/2011 10:18  Ref. Range 06/10/2009 19:41 07/27/2009 08:09 09/12/2011 15:16  Ferritin Latest Range: 10.0-291.0 ng/mL 797 (H) 432 (H) 115.4    Lab Results  Component Value Date   LABCA2 88* 09/28/2011    STUDIES: Dg Chest 2 View  04/13/2012  *RADIOLOGY REPORT*  Clinical Data: Chest pain.  Pain under both breasts since 3 o'clock this morning.  CHEST - 2 VIEW  Comparison: 04/03/2012  Findings: The patient has a right-sided Port-A-Cath, tip to the level of the superior vena cava.  The heart is mildly enlarged. There is prominence of interstitial markings, stable in appearance.  There is minimal left lower lobe atelectasis or early infiltrate. Degenerative changes are seen in the spine. There is patchy appearance of the bones, consistent with metastatic disease.  IMPRESSION:  1.  Cardiomegaly. 2. Left lower lobe infiltrate versus atelectasis.   Original Report Authenticated By: Norva Pavlov, M.D.    Mr Chest Wo Contrast  04/09/2012  *RADIOLOGY REPORT*  Clinical Data: Severe  left shoulder pain and weakness and limited range of motion.  Metastatic breast cancer to bone,  MRI CHEST WITHOUT CONTRAST  Technique:  Multiplanar multisequence MR imaging of the chest was performed. No intravenous contrast was administered.  Comparison: Radiographs of the left shoulder dated 04/03/2012  Findings: The left brachial plexus appears normal.  There are diffuse sclerotic osseous metastases throughout all of the visualized bones.  The metastases in the proximal humerus, the acromion, and along the superior margin of the scapula are particularly prominent.  The patient does have subacromial/subdeltoid bursitis.  There is no atrophy or edema in the muscles of the rotator cuff. There is no visible Pancoast tumor.  There is evidence of a soft disc protrusion central and slightly to the left at C6-7 which might affect the left C7 nerve.  There is a tiny glenohumeral joint effusion.  Contrast was not utilized because the patient refused further imaging.  IMPRESSION:  1.  Normal left brachial plexus. 2.  Left subacromial/subdeltoid bursitis. 3.  Soft disc protrusion at C6-7 to the left which might affect the left C7 nerve. 4.  No osseous metastases involving all of the visualized bones, particularly prominent in the proximal humerus and in the superior margin of the scapula and in the acromion.   Original Report Authenticated By: Francene Boyers, M.D.    Ct Abdomen Pelvis W Contrast  04/13/2012  *RADIOLOGY REPORT*  Clinical Data: Chest pain.  History of breast cancer 2 years ago. Diabetes.  Abdominal pain.  CT ABDOMEN AND PELVIS WITH CONTRAST  Technique:  Multidetector CT imaging of the abdomen and pelvis was performed following the standard protocol during bolus administration of intravenous contrast.  Contrast: 50mL OMNIPAQUE IOHEXOL 300 MG/ML  SOLN, OMNIPAQUE IOHEXOL 300 MG/ML  SOLN  Comparison: 11/20/2011  Findings: Postoperative changes in the right chest wall and probable postradiation changes in  the right lung base anteriorly. Focal nodular opacity in the anterior right lung may represent scarring and is stable since previous study.  This is measured at 7 mm.  Coronary artery calcification.  Small esophageal hiatal hernia.  The tiny lesion previously demonstrated in the left lobe of the liver is not definitely appreciated today.  No focal liver lesions are identified today.  The gallbladder is contracted, likely physiologic.  The pancreas is diffusely fatty infiltrated.  The spleen, adrenal glands, kidneys, and retroperitoneal lymph nodes are unremarkable.  The stomach, small bowel, and colon are not abnormally distended.  Diffusely stool filled colon.  No free air or free fluid in the abdomen.  Calcification of the abdominal aorta without aneurysm.  Pelvis:  The uterus and adnexal structures are not enlarged.  The bladder wall is not thickened.  No free or loculated pelvic fluid collections.  No significant pelvic lymphadenopathy.  No evidence of diverticulitis.  The appendix is not identified.  Diffuse pattern of mixed sclerosis and lucency throughout the visualized bones including vertebrae, pelvis, sacrum, and hips consistent with diffuse skeletal metastasis.  This is stable since previous study.  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  Postoperative and post radiation changes in the right  anterior chest. Diffuse fatty atrophy of the pancreas.  No acute process demonstrated in the abdomen or pelvis.   Original Report Authenticated By: Burman Nieves, M.D.    Mr Shoulder Left Wo Contrast  04/09/2012  *RADIOLOGY REPORT*  Clinical Data:  Left shoulder pain and weakness in patient with known metastatic breast cancer.  MRI OF THE LEFT SHOULDER WITHOUT CONTRAST  Technique:  Multiplanar, multisequence MR imaging of the left shoulder was performed.  No intravenous contrast was administered.  Comparison:  Plain films 04/03/2012.  FINDINGS: Rotator cuff:  There is marked thickening and  heterogeneously increased T2 signal in the supraspinatus and infraspinatus tendons consistent with tendinopathy.  The tendons are intact.  The subscapularis is unremarkable. Muscles:  Preserved.  No atrophy or focal lesion. Biceps long head:  Intact.  Acromioclavicular Joint:  Fairly bulky acromioclavicular degenerative change is identified. Glenohumeral Joint:  Unremarkable.  Labrum:  Intact. Bones:  Extensive metastatic disease is identified in all visualized bones.  No fracture is identified.  Fluid is present in the subacromial/subdeltoid bursa.  IMPRESSION:  1.  Osseous metastatic disease in all visualized bones.  Negative for fracture. 2.  Supraspinatus and infraspinatus tendinopathy without tear. 3.  Acromioclavicular osteoarthritis and subacromial/subdeltoid bursitis.   Original Report Authenticated By: Holley Dexter, M.D.    Mm Digital Screening Unilat L  04/26/2012  *RADIOLOGY REPORT*  Clinical Data: Screening.  MAMMOGRAPHIC UNILATERAL LEFT DIGITAL SCREENING WITH CAD  Comparison:  Previous exams.  FINDINGS:  ACR Breast Density Category 3: The breast tissue is heterogeneously dense.  No suspicious masses, architectural distortion, or calcifications are present.  Images were processed with CAD.  IMPRESSION: No mammographic evidence of malignancy.  A result letter of this screening mammogram will be mailed directly to the patient.  RECOMMENDATION: Screening mammogram in one year. (Code:SM-B-01Y)  BI-RADS CATEGORY 1:  Negative.   Original Report Authenticated By: Christiana Pellant, M.D.       ASSESSMENT: 72 y.o. [place] woman   (1) status post right breast biopsy May of 2011 for a grade 2 invasive ductal carcinoma, T2 NX M1, Stage IV, with bone-only involvement, strongly estrogen and progesterone receptor-positive, HER-2-negative with an MIB-1 of 26%.   (2) Neoadjuvantly she received letrozole and zoledronic acid beginning in June of 2011 and underwent right modified radical mastectomy February  of 2012 for a ypT2 ypN2, grade 1 invasive ductal carcinoma with negative margins.   (3) She completed radiation therapy in August of 2012.   (4) She has continued on letrozole but was switched from zoledronic acid to denosumab Rivka Barbara) because of concerns regarding her serum creatinine. Rivka Barbara was being given every 8 weeks.  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  (6) symptomatic anemia, with creatinine clearance < 60 cc/min; darbepoietin Q14d started 11/03/2011; received feraheme 01/05/2012  (7) on subcutaneous B-12 supplementation monthly  (8) Pain in left upper extremity with osseous metastasis, osteoarthritis, tendinopathy, and bursitis as confirmed by recent MRI  (9) anemia, multifactorial, on Aranesp every 2 weeks    PLAN: Mateja appears stable from a breast cancer point of view. She has extensive bone involvement, but no liver or, lung or brain metastases. She has multiple other medical problems, which in the may be more life limiting than her breast cancer. The plan is to continue letrozole as well as the supportive aranesp and B-12. We are not planning to restart denosumab. I corrected the dates of her treatments, and she will see [place] again in June. Brittney Cunningham knows  to call for any problems that may develop before that visit.

## 2012-05-03 ENCOUNTER — Other Ambulatory Visit: Payer: Self-pay | Admitting: *Deleted

## 2012-05-03 DIAGNOSIS — C50919 Malignant neoplasm of unspecified site of unspecified female breast: Secondary | ICD-10-CM

## 2012-05-03 MED ORDER — LETROZOLE 2.5 MG PO TABS: 2.5000 mg | ORAL_TABLET | Freq: Every day | ORAL | Status: DC

## 2012-05-13 ENCOUNTER — Ambulatory Visit (HOSPITAL_BASED_OUTPATIENT_CLINIC_OR_DEPARTMENT_OTHER): Payer: Medicare Other

## 2012-05-13 ENCOUNTER — Other Ambulatory Visit (HOSPITAL_BASED_OUTPATIENT_CLINIC_OR_DEPARTMENT_OTHER): Payer: Medicare Other | Admitting: Lab

## 2012-05-13 VITALS — BP 143/51 | HR 68 | Temp 98.3°F

## 2012-05-13 DIAGNOSIS — D649 Anemia, unspecified: Secondary | ICD-10-CM

## 2012-05-13 DIAGNOSIS — C50911 Malignant neoplasm of unspecified site of right female breast: Secondary | ICD-10-CM

## 2012-05-13 DIAGNOSIS — C50919 Malignant neoplasm of unspecified site of unspecified female breast: Secondary | ICD-10-CM

## 2012-05-13 DIAGNOSIS — C7951 Secondary malignant neoplasm of bone: Secondary | ICD-10-CM

## 2012-05-13 DIAGNOSIS — D630 Anemia in neoplastic disease: Secondary | ICD-10-CM

## 2012-05-13 DIAGNOSIS — K219 Gastro-esophageal reflux disease without esophagitis: Secondary | ICD-10-CM

## 2012-05-13 LAB — COMPREHENSIVE METABOLIC PANEL (CC13)
Albumin: 2.8 g/dL — ABNORMAL LOW (ref 3.5–5.0)
CO2: 23 mEq/L (ref 22–29)
Calcium: 9.4 mg/dL (ref 8.4–10.4)
Chloride: 104 mEq/L (ref 98–107)
Glucose: 194 mg/dl — ABNORMAL HIGH (ref 70–99)
Potassium: 4.5 mEq/L (ref 3.5–5.1)
Sodium: 138 mEq/L (ref 136–145)
Total Bilirubin: 0.39 mg/dL (ref 0.20–1.20)
Total Protein: 6.7 g/dL (ref 6.4–8.3)

## 2012-05-13 LAB — CBC WITH DIFFERENTIAL/PLATELET
Eosinophils Absolute: 0.3 10*3/uL (ref 0.0–0.5)
LYMPH%: 23.2 % (ref 14.0–49.7)
MONO#: 0.7 10*3/uL (ref 0.1–0.9)
NEUT#: 4.4 10*3/uL (ref 1.5–6.5)
Platelets: 254 10*3/uL (ref 145–400)
RBC: 3.31 10*6/uL — ABNORMAL LOW (ref 3.70–5.45)
WBC: 7.1 10*3/uL (ref 3.9–10.3)

## 2012-05-13 MED ORDER — DARBEPOETIN ALFA-POLYSORBATE 200 MCG/0.4ML IJ SOLN
200.0000 ug | Freq: Once | INTRAMUSCULAR | Status: AC
  Administered 2012-05-13: 200 ug via SUBCUTANEOUS
  Filled 2012-05-13: qty 0.4

## 2012-05-13 NOTE — Patient Instructions (Addendum)
Darbepoetin Alfa injection What is this medicine? DARBEPOETIN ALFA (dar be POE e tin AL fa) helps your body make more red blood cells. It is used to treat anemia caused by chronic kidney failure and chemotherapy. This medicine may be used for other purposes; ask your health care provider or pharmacist if you have questions. What should I tell my health care provider before I take this medicine? They need to know if you have any of these conditions: -blood clotting disorders or history of blood clots -cancer patient not on chemotherapy -cystic fibrosis -heart disease, such as angina, heart failure, or a history of a heart attack -hemoglobin level of 12 g/dL or greater -high blood pressure -low levels of folate, iron, or vitamin B12 -seizures -an unusual or allergic reaction to darbepoetin, erythropoietin, albumin, hamster proteins, latex, other medicines, foods, dyes, or preservatives -pregnant or trying to get pregnant -breast-feeding How should I use this medicine? This medicine is for injection into a vein or under the skin. It is usually given by a health care professional in a hospital or clinic setting. If you get this medicine at home, you will be taught how to prepare and give this medicine. Do not shake the solution before you withdraw a dose. Use exactly as directed. Take your medicine at regular intervals. Do not take your medicine more often than directed. It is important that you put your used needles and syringes in a special sharps container. Do not put them in a trash can. If you do not have a sharps container, call your pharmacist or healthcare provider to get one. Talk to your pediatrician regarding the use of this medicine in children. While this medicine may be used in children as young as 1 year for selected conditions, precautions do apply. Overdosage: If you think you have taken too much of this medicine contact a poison control center or emergency room at once. NOTE:  This medicine is only for you. Do not share this medicine with others. What if I miss a dose? If you miss a dose, take it as soon as you can. If it is almost time for your next dose, take only that dose. Do not take double or extra doses. What may interact with this medicine? Do not take this medicine with any of the following medications: -epoetin alfa This list may not describe all possible interactions. Give your health care provider a list of all the medicines, herbs, non-prescription drugs, or dietary supplements you use. Also tell them if you smoke, drink alcohol, or use illegal drugs. Some items may interact with your medicine. What should I watch for while using this medicine? Visit your prescriber or health care professional for regular checks on your progress and for the needed blood tests and blood pressure measurements. It is especially important for the doctor to make sure your hemoglobin level is in the desired range, to limit the risk of potential side effects and to give you the best benefit. Keep all appointments for any recommended tests. Check your blood pressure as directed. Ask your doctor what your blood pressure should be and when you should contact him or her. As your body makes more red blood cells, you may need to take iron, folic acid, or vitamin B supplements. Ask your doctor or health care provider which products are right for you. If you have kidney disease continue dietary restrictions, even though this medication can make you feel better. Talk with your doctor or health care professional about the   foods you eat and the vitamins that you take. What side effects may I notice from receiving this medicine? Side effects that you should report to your doctor or health care professional as soon as possible: -allergic reactions like skin rash, itching or hives, swelling of the face, lips, or tongue -breathing problems -changes in vision -chest pain -confusion, trouble speaking  or understanding -feeling faint or lightheaded, falls -high blood pressure -muscle aches or pains -pain, swelling, warmth in the leg -rapid weight gain -severe headaches -sudden numbness or weakness of the face, arm or leg -trouble walking, dizziness, loss of balance or coordination -seizures (convulsions) -swelling of the ankles, feet, hands -unusually weak or tired Side effects that usually do not require medical attention (report to your doctor or health care professional if they continue or are bothersome): -diarrhea -fever, chills (flu-like symptoms) -headaches -nausea, vomiting -redness, stinging, or swelling at site where injected This list may not describe all possible side effects. Call your doctor for medical advice about side effects. You may report side effects to FDA at 1-800-FDA-1088. Where should I keep my medicine? Keep out of the reach of children. Store in a refrigerator between 2 and 8 degrees C (36 and 46 degrees F). Do not freeze. Do not shake. Throw away any unused portion if using a single-dose vial. Throw away any unused medicine after the expiration date. NOTE: This sheet is a summary. It may not cover all possible information. If you have questions about this medicine, talk to your doctor, pharmacist, or health care provider.  2013, [employer]. (12/31/2007 10:23:57 AM)  

## 2012-05-20 ENCOUNTER — Other Ambulatory Visit: Payer: Self-pay | Admitting: *Deleted

## 2012-05-20 DIAGNOSIS — C50911 Malignant neoplasm of unspecified site of right female breast: Secondary | ICD-10-CM

## 2012-05-20 MED ORDER — OXYCODONE-ACETAMINOPHEN 5-325 MG PO TABS: 1.0000 | ORAL_TABLET | Freq: Four times a day (QID) | ORAL | Status: DC | PRN

## 2012-05-27 ENCOUNTER — Other Ambulatory Visit: Payer: Self-pay | Admitting: Lab

## 2012-05-27 ENCOUNTER — Other Ambulatory Visit: Payer: Self-pay | Admitting: Oncology

## 2012-05-28 ENCOUNTER — Ambulatory Visit (HOSPITAL_BASED_OUTPATIENT_CLINIC_OR_DEPARTMENT_OTHER): Payer: Medicare Other

## 2012-05-28 ENCOUNTER — Other Ambulatory Visit (HOSPITAL_BASED_OUTPATIENT_CLINIC_OR_DEPARTMENT_OTHER): Payer: Medicare Other | Admitting: Lab

## 2012-05-28 VITALS — BP 139/61 | HR 70 | Temp 98.1°F

## 2012-05-28 DIAGNOSIS — D649 Anemia, unspecified: Secondary | ICD-10-CM

## 2012-05-28 DIAGNOSIS — C50919 Malignant neoplasm of unspecified site of unspecified female breast: Secondary | ICD-10-CM

## 2012-05-28 DIAGNOSIS — D630 Anemia in neoplastic disease: Secondary | ICD-10-CM

## 2012-05-28 DIAGNOSIS — K219 Gastro-esophageal reflux disease without esophagitis: Secondary | ICD-10-CM

## 2012-05-28 DIAGNOSIS — C50911 Malignant neoplasm of unspecified site of right female breast: Secondary | ICD-10-CM

## 2012-05-28 LAB — CBC WITH DIFFERENTIAL/PLATELET
Basophils Absolute: 0.1 10*3/uL (ref 0.0–0.1)
Eosinophils Absolute: 0.3 10*3/uL (ref 0.0–0.5)
LYMPH%: 24.3 % (ref 14.0–49.7)
MCH: 29.4 pg (ref 25.1–34.0)
MCV: 89.7 fL (ref 79.5–101.0)
MONO%: 8.7 % (ref 0.0–14.0)
NEUT%: 62.4 % (ref 38.4–76.8)
WBC: 7.5 10*3/uL (ref 3.9–10.3)

## 2012-05-28 LAB — FERRITIN: Ferritin: 358 ng/mL — ABNORMAL HIGH (ref 10–291)

## 2012-05-28 MED ORDER — DARBEPOETIN ALFA-POLYSORBATE 200 MCG/0.4ML IJ SOLN
200.0000 ug | Freq: Once | INTRAMUSCULAR | Status: AC
  Administered 2012-05-28: 200 ug via SUBCUTANEOUS
  Filled 2012-05-28: qty 0.4

## 2012-05-28 MED ORDER — CYANOCOBALAMIN 1000 MCG/ML IJ SOLN
1000.0000 ug | Freq: Once | INTRAMUSCULAR | Status: AC
  Administered 2012-05-28: 1000 ug via INTRAMUSCULAR

## 2012-06-06 ENCOUNTER — Telehealth: Payer: Self-pay | Admitting: *Deleted

## 2012-06-06 NOTE — Telephone Encounter (Signed)
sw pt she requested to come in early so that she could attend her other appts. gv appt for lab @10 :45am and inj for 11:15am. Pt is aware.Marland Kitchentd

## 2012-06-10 ENCOUNTER — Other Ambulatory Visit: Payer: Self-pay | Admitting: Lab

## 2012-06-10 ENCOUNTER — Ambulatory Visit: Payer: Self-pay

## 2012-06-10 ENCOUNTER — Ambulatory Visit: Payer: Self-pay | Admitting: Oncology

## 2012-06-10 ENCOUNTER — Telehealth: Payer: Self-pay | Admitting: *Deleted

## 2012-06-10 ENCOUNTER — Ambulatory Visit: Payer: Self-pay | Admitting: Family Medicine

## 2012-06-10 NOTE — Progress Notes (Signed)
Pt FTKA today for lab/injection.  Message left on desk RN VM to follow up.

## 2012-06-10 NOTE — Telephone Encounter (Signed)
sw pt she wanted to cancel her appts for 06/10/12. She stated that she would call back to r/s at a later time...td

## 2012-06-12 ENCOUNTER — Telehealth: Payer: Self-pay | Admitting: *Deleted

## 2012-06-12 ENCOUNTER — Other Ambulatory Visit (HOSPITAL_BASED_OUTPATIENT_CLINIC_OR_DEPARTMENT_OTHER): Payer: Medicare Other | Admitting: Lab

## 2012-06-12 ENCOUNTER — Ambulatory Visit (HOSPITAL_BASED_OUTPATIENT_CLINIC_OR_DEPARTMENT_OTHER): Payer: Medicare Other

## 2012-06-12 VITALS — BP 109/84 | HR 84 | Temp 98.5°F

## 2012-06-12 DIAGNOSIS — C50619 Malignant neoplasm of axillary tail of unspecified female breast: Secondary | ICD-10-CM

## 2012-06-12 DIAGNOSIS — K219 Gastro-esophageal reflux disease without esophagitis: Secondary | ICD-10-CM

## 2012-06-12 DIAGNOSIS — C7951 Secondary malignant neoplasm of bone: Secondary | ICD-10-CM

## 2012-06-12 DIAGNOSIS — D630 Anemia in neoplastic disease: Secondary | ICD-10-CM

## 2012-06-12 DIAGNOSIS — C50919 Malignant neoplasm of unspecified site of unspecified female breast: Secondary | ICD-10-CM

## 2012-06-12 DIAGNOSIS — D649 Anemia, unspecified: Secondary | ICD-10-CM

## 2012-06-12 DIAGNOSIS — C50911 Malignant neoplasm of unspecified site of right female breast: Secondary | ICD-10-CM

## 2012-06-12 LAB — CBC WITH DIFFERENTIAL/PLATELET
BASO%: 0.6 % (ref 0.0–2.0)
HCT: 33 % — ABNORMAL LOW (ref 34.8–46.6)
LYMPH%: 25.7 % (ref 14.0–49.7)
MCHC: 33 g/dL (ref 31.5–36.0)
MCV: 89.8 fL (ref 79.5–101.0)
MONO#: 0.7 10*3/uL (ref 0.1–0.9)
MONO%: 8.5 % (ref 0.0–14.0)
NEUT%: 61.2 % (ref 38.4–76.8)
Platelets: 251 10*3/uL (ref 145–400)
WBC: 8 10*3/uL (ref 3.9–10.3)

## 2012-06-12 LAB — COMPREHENSIVE METABOLIC PANEL (CC13)
ALT: 9 U/L (ref 0–55)
Albumin: 3.2 g/dL — ABNORMAL LOW (ref 3.5–5.0)
CO2: 22 mEq/L (ref 22–29)
Chloride: 104 mEq/L (ref 98–107)
Potassium: 5 mEq/L (ref 3.5–5.1)
Sodium: 136 mEq/L (ref 136–145)
Total Bilirubin: 0.34 mg/dL (ref 0.20–1.20)
Total Protein: 7.1 g/dL (ref 6.4–8.3)

## 2012-06-12 MED ORDER — DARBEPOETIN ALFA-POLYSORBATE 200 MCG/0.4ML IJ SOLN
200.0000 ug | Freq: Once | INTRAMUSCULAR | Status: AC
  Administered 2012-06-12: 200 ug via SUBCUTANEOUS
  Filled 2012-06-12: qty 0.4

## 2012-06-12 NOTE — Telephone Encounter (Signed)
sw pt she wanted to r/s  her missed appt. gv appt d/t for 06/12/12. Lab@ 11am and inj @11 :30am. Pt is aware...td

## 2012-06-13 ENCOUNTER — Other Ambulatory Visit: Payer: Self-pay | Admitting: *Deleted

## 2012-06-13 MED ORDER — CLOPIDOGREL BISULFATE 75 MG PO TABS: 75.0000 mg | ORAL_TABLET | Freq: Every day | ORAL | Status: DC

## 2012-06-17 ENCOUNTER — Other Ambulatory Visit: Payer: Self-pay | Admitting: *Deleted

## 2012-06-17 DIAGNOSIS — C50911 Malignant neoplasm of unspecified site of right female breast: Secondary | ICD-10-CM

## 2012-06-17 MED ORDER — OXYCODONE-ACETAMINOPHEN 5-325 MG PO TABS: 1.0000 | ORAL_TABLET | Freq: Four times a day (QID) | ORAL | Status: DC | PRN

## 2012-06-25 ENCOUNTER — Ambulatory Visit (HOSPITAL_BASED_OUTPATIENT_CLINIC_OR_DEPARTMENT_OTHER): Payer: Medicare Other

## 2012-06-25 ENCOUNTER — Other Ambulatory Visit (HOSPITAL_BASED_OUTPATIENT_CLINIC_OR_DEPARTMENT_OTHER): Payer: Medicare Other | Admitting: Lab

## 2012-06-25 VITALS — BP 151/63 | HR 68 | Temp 97.7°F

## 2012-06-25 DIAGNOSIS — D649 Anemia, unspecified: Secondary | ICD-10-CM

## 2012-06-25 DIAGNOSIS — D630 Anemia in neoplastic disease: Secondary | ICD-10-CM

## 2012-06-25 DIAGNOSIS — C50919 Malignant neoplasm of unspecified site of unspecified female breast: Secondary | ICD-10-CM

## 2012-06-25 DIAGNOSIS — K219 Gastro-esophageal reflux disease without esophagitis: Secondary | ICD-10-CM

## 2012-06-25 DIAGNOSIS — Z452 Encounter for adjustment and management of vascular access device: Secondary | ICD-10-CM

## 2012-06-25 DIAGNOSIS — C7951 Secondary malignant neoplasm of bone: Secondary | ICD-10-CM

## 2012-06-25 DIAGNOSIS — C7952 Secondary malignant neoplasm of bone marrow: Secondary | ICD-10-CM

## 2012-06-25 LAB — CBC WITH DIFFERENTIAL/PLATELET
BASO%: 0.7 % (ref 0.0–2.0)
EOS%: 4.1 % (ref 0.0–7.0)
HCT: 32.6 % — ABNORMAL LOW (ref 34.8–46.6)
LYMPH%: 25.4 % (ref 14.0–49.7)
MCH: 28.8 pg (ref 25.1–34.0)
MCHC: 32.1 g/dL (ref 31.5–36.0)
NEUT%: 62.7 % (ref 38.4–76.8)
RBC: 3.64 10*6/uL — ABNORMAL LOW (ref 3.70–5.45)
lymph#: 2 10*3/uL (ref 0.9–3.3)

## 2012-06-25 LAB — FERRITIN: Ferritin: 321 ng/mL — ABNORMAL HIGH (ref 10–291)

## 2012-06-25 MED ORDER — DARBEPOETIN ALFA-POLYSORBATE 200 MCG/0.4ML IJ SOLN
200.0000 ug | Freq: Once | INTRAMUSCULAR | Status: AC
  Administered 2012-06-25: 200 ug via SUBCUTANEOUS
  Filled 2012-06-25: qty 0.4

## 2012-06-25 MED ORDER — CYANOCOBALAMIN 1000 MCG/ML IJ SOLN
1000.0000 ug | Freq: Once | INTRAMUSCULAR | Status: AC
  Administered 2012-06-25: 1000 ug via INTRAMUSCULAR

## 2012-06-25 MED ORDER — HEPARIN SOD (PORK) LOCK FLUSH 100 UNIT/ML IV SOLN
500.0000 [IU] | Freq: Once | INTRAVENOUS | Status: AC
  Administered 2012-06-25: 500 [IU] via INTRAVENOUS
  Filled 2012-06-25: qty 5

## 2012-06-25 MED ORDER — SODIUM CHLORIDE 0.9 % IJ SOLN
10.0000 mL | INTRAMUSCULAR | Status: DC | PRN
  Administered 2012-06-25: 10 mL via INTRAVENOUS
  Filled 2012-06-25: qty 10

## 2012-06-26 ENCOUNTER — Ambulatory Visit (INDEPENDENT_AMBULATORY_CARE_PROVIDER_SITE_OTHER): Payer: Medicare Other | Admitting: Family Medicine

## 2012-06-26 ENCOUNTER — Encounter: Payer: Self-pay | Admitting: Family Medicine

## 2012-06-26 VITALS — BP 118/62 | HR 68 | Temp 98.5°F | Ht 63.0 in | Wt 229.1 lb

## 2012-06-26 DIAGNOSIS — E785 Hyperlipidemia, unspecified: Secondary | ICD-10-CM

## 2012-06-26 DIAGNOSIS — E119 Type 2 diabetes mellitus without complications: Secondary | ICD-10-CM

## 2012-06-26 DIAGNOSIS — I10 Essential (primary) hypertension: Secondary | ICD-10-CM

## 2012-06-26 DIAGNOSIS — E039 Hypothyroidism, unspecified: Secondary | ICD-10-CM

## 2012-06-26 DIAGNOSIS — K5900 Constipation, unspecified: Secondary | ICD-10-CM

## 2012-06-26 LAB — TSH: TSH: 0.03 u[IU]/mL — ABNORMAL LOW (ref 0.350–4.500)

## 2012-06-26 LAB — HEMOGLOBIN A1C
Hgb A1c MFr Bld: 7.8 % — ABNORMAL HIGH (ref <5.7)
Mean Plasma Glucose: 177 mg/dL — ABNORMAL HIGH (ref <117)

## 2012-06-26 LAB — LIPID PANEL
HDL: 29 mg/dL — ABNORMAL LOW (ref >39)
LDL Cholesterol: 164 mg/dL — ABNORMAL HIGH (ref 0–99)
Total CHOL/HDL Ratio: 8.9 Ratio

## 2012-06-26 LAB — HEPATIC FUNCTION PANEL: Alkaline Phosphatase: 126 U/L — ABNORMAL HIGH (ref 39–117)

## 2012-06-26 NOTE — Patient Instructions (Addendum)
Labs prior to next visit, lipid, tsh, hgba1c, hepatic

## 2012-06-30 ENCOUNTER — Encounter: Payer: Self-pay | Admitting: Family Medicine

## 2012-06-30 NOTE — Assessment & Plan Note (Signed)
Well controlled on current meds no changes 

## 2012-06-30 NOTE — Assessment & Plan Note (Signed)
Lipitor 40 mg, avoid trans fats.

## 2012-06-30 NOTE — Assessment & Plan Note (Signed)
hgba1c 7.8, had stopped Metformin will restart 500 mg daily, avoid simple carbs, increase activity

## 2012-06-30 NOTE — Assessment & Plan Note (Signed)
Encouraged probiotics, increased fiber and fluids, may use docusate and or senna daily as well.

## 2012-06-30 NOTE — Progress Notes (Signed)
Patient ID: Brittney Cunningham, female   DOB: 30-Nov-1940, 72 y.o.   MRN: [id number] Brittney Cunningham [id number] Jun 08, 1940 06/30/2012      Progress Note-Follow Up  Subjective  Chief Complaint  Chief Complaint  Patient presents with  . Follow-up    HPI  Patient is a 72 year old female in today for followup. Overall she reports doing well. No recent illness. No fevers or chills. Left ear is been generally well-controlled. No polyuria or polydipsia. No chest pain, palpitations, shortness of breath or GU complaints. She is noting some trouble constipation. She has bloating and discomfort when this occurs. She will have episodes of being unable to move her bowels for several days. Activity a yogurt seems to help some.   Past Medical History  Diagnosis Date  . Anemia   . Depression   . Diabetes mellitus type II   . GERD (gastroesophageal reflux disease)   . Hyperlipidemia   . Hypothyroidism   . Osteoarthritis   . Carotid artery stenosis     bilateral  . CAD (coronary artery disease)   . OSA (obstructive sleep apnea)   . Breast cancer     right breast cancer-invasive  ductal carcinoma (StageIV)  . Hypertension   . Anxiety   . Carotid artery occlusion   . Unspecified constipation 03/15/2012  . Bursitis   . Collagen vascular disease     Past Surgical History  Procedure Laterality Date  . Incisional breast biopsy      remoted left breast biopsy  . Cesarean section    . Other surgical history      GYN surgery  . Knee surgery      right knee surgery  . Carotid endarterectomy      right carotid  . Port-a-cath removal    . Modified mastectomy      Right breast  . Breast surgery    . Mastectomy    . Carotid stent      left    Family History  Problem Relation Age of Onset  . Arthritis    . Coronary artery disease      first degree relative  . Stroke      first degree relative  . Diabetes Mother   . Heart disease Mother   . Hypertension Mother   . Colon cancer Neg Hx   .  Stomach cancer Neg Hx     History   Social History  . Marital Status: Married    Spouse Name: N/A    Number of Children: 3  . Years of Education: N/A   Occupational History  . 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 Smoking status: Former Smoker -- 1.00 packs/day for 20 years    Types: Cigarettes    Quit date: 01/31/1988  . Smokeless tobacco: Never Used  . Alcohol Use: No  . Drug Use: No  . Sexually Active: Not on file   Other Topics Concern  . Not on file   Social History Narrative   Retired-ran a daycare facility for 40 years.   Married- 43 years    2 sons    1 daughter - Engineer, civil (consulting) midwife   Former smoker-quit in [place].     Current Outpatient Prescriptions on File Prior to Visit  Medication Sig Dispense Refill  . aspirin 81 MG chewable tablet Chew 81 mg by mouth daily.      Marland Kitchen atorvastatin (LIPITOR) 40 MG tablet Take 1  tablet (40 mg total) by mouth daily.  90 tablet  1  . carvedilol (COREG) 12.5 MG tablet Take 1 tablet (12.5 mg total) by mouth 2 (two) times daily with a meal.  60 tablet  11  . chlorhexidine (PERIDEX) 0.12 % solution Rinse for 90  seconds  with 15 mLs three times daily. Spit out excess. Do NOT swallow.      . clopidogrel (PLAVIX) 75 MG tablet Take 1 tablet (75 mg total) by mouth daily.  30 tablet  6  . cyanocobalamin (,VITAMIN B-12,) 1000 MCG/ML injection Inject 1,000 mcg into the muscle every 30 (thirty) days. Last dose 03/09/2011      . darbepoetin (ARANESP) 200 MCG/0.4ML SOLN Inject 200 mcg into the skin once. Every 2 weeks      . glycerin adult (GLYCERIN ADULT) 2 G SUPP Place 1 suppository rectally once as needed (constipation).  10 suppository  0  . hydrochlorothiazide (MICROZIDE) 12.5 MG capsule Take 1 capsule (12.5 mg total) by mouth daily.  30 capsule  6  . ibuprofen (ADVIL) 200 MG tablet Take 200 mg by mouth 3 (three) times daily.        . insulin glargine (LANTUS) 100 UNIT/ML injection Inject 60 Units into the skin at  bedtime. Pt wants vials.  30 mL  3  . letrozole (FEMARA) 2.5 MG tablet Take 1 tablet (2.5 mg total) by mouth daily.  30 tablet  3  . levothyroxine (SYNTHROID, LEVOTHROID) 75 MCG tablet Take 75 mcg by mouth daily.      Marland Kitchen losartan (COZAAR) 50 MG tablet Take 1 tablet (50 mg total) by mouth daily.  30 tablet  6  . metFORMIN (GLUCOPHAGE) 500 MG tablet Take 1 tablet (500 mg total) by mouth every morning.  30 tablet  5  . oxyCODONE-acetaminophen (PERCOCET/ROXICET) 5-325 MG per tablet Take 1 tablet by mouth 4 (four) times daily as needed. For pain  120 tablet  0  . pantoprazole (PROTONIX) 40 MG tablet Take 1 tablet (40 mg total) by mouth daily.  30 tablet  3  . ranitidine (ZANTAC) 150 MG tablet Take 1 tablet (150 mg total) by mouth 2 (two) times daily.  30 tablet  3  . sennosides-docusate sodium (SENOKOT-S) 8.6-50 MG tablet 1 or 2 tabs by mouth daily      . [DISCONTINUED] simvastatin (ZOCOR) 40 MG tablet Take 40 mg by mouth every evening.       No current facility-administered medications on file prior to visit.    Allergies  Allergen Reactions  . Chlorhexidine Hives, Itching and Rash    This was most likely a CONTACT DERMATITIS versus true systemic allergic reaction  . Adhesive (Tape) Hives  . Codeine Swelling    Review of Systems  Review of Systems  Constitutional: Negative for fever and malaise/fatigue.  HENT: Negative for congestion.   Eyes: Negative for discharge.  Respiratory: Negative for shortness of breath.   Cardiovascular: Negative for chest pain, palpitations and leg swelling.  Gastrointestinal: Positive for constipation. Negative for nausea, abdominal pain and diarrhea.  Genitourinary: Negative for dysuria.  Musculoskeletal: Negative for falls.  Skin: Negative for rash.  Neurological: Negative for loss of consciousness and headaches.  Endo/Heme/Allergies: Negative for polydipsia.  Psychiatric/Behavioral: Negative for depression and suicidal ideas. The patient is not  nervous/anxious and does not have insomnia.     Objective  BP 118/62  Pulse 68  Temp(Src) 98.5 F (36.9 C) (Oral)  Ht 5\' 3"  (1.6 m)  Wt 229 lb 1.9  oz (103.928 kg)  BMI 40.6 kg/m2  SpO2 95%  LMP 03/13/2012  Physical Exam  Physical Exam  Constitutional: She is oriented to person, place, and time and well-developed, well-nourished, and in no distress. No distress.  HENT:  Head: Normocephalic and atraumatic.  Eyes: Conjunctivae are normal.  Neck: Neck supple. No thyromegaly present.  Cardiovascular: Normal rate, regular rhythm and normal heart sounds.   No murmur heard. Pulmonary/Chest: Effort normal and breath sounds normal. She has no wheezes.  Abdominal: She exhibits no distension and no mass.  Musculoskeletal: She exhibits no edema.  Lymphadenopathy:    She has no cervical adenopathy.  Neurological: She is alert and oriented to person, place, and time.  Skin: Skin is warm and dry. No rash noted. She is not diaphoretic.  Psychiatric: Memory, affect and judgment normal.    Lab Results  Component Value Date   TSH 0.030* 06/26/2012   Lab Results  Component Value Date   WBC 7.7 06/25/2012   HGB 10.5* 06/25/2012   HCT 32.6* 06/25/2012   MCV 89.6 06/25/2012   PLT 338 06/25/2012   Lab Results  Component Value Date   CREATININE 1.3* 06/12/2012   BUN 32.1* 06/12/2012   NA 136 06/12/2012   K 5.0 06/12/2012   CL 104 06/12/2012   CO2 22 06/12/2012   Lab Results  Component Value Date   ALT <8 06/26/2012   AST 14 06/26/2012   ALKPHOS 126* 06/26/2012   BILITOT 0.3 06/26/2012   Lab Results  Component Value Date   CHOL 258* 06/26/2012   Lab Results  Component Value Date   HDL 29* 06/26/2012   Lab Results  Component Value Date   LDLCALC 164* 06/26/2012   Lab Results  Component Value Date   TRIG 326* 06/26/2012   Lab Results  Component Value Date   CHOLHDL 8.9 06/26/2012     Assessment & Plan  HYPERTENSION Well controlled on current meds no changes  DIABETES MELLITUS,  TYPE II hgba1c 7.8, had stopped Metformin will restart 500 mg daily, avoid simple carbs, increase activity  HYPERLIPIDEMIA Lipitor 40 mg, avoid trans fats.  Unspecified constipation Encouraged probiotics, increased fiber and fluids, may use docusate and or senna daily as well.

## 2012-07-08 ENCOUNTER — Other Ambulatory Visit (HOSPITAL_BASED_OUTPATIENT_CLINIC_OR_DEPARTMENT_OTHER): Payer: Medicare Other

## 2012-07-08 ENCOUNTER — Ambulatory Visit: Payer: Self-pay

## 2012-07-08 DIAGNOSIS — C50919 Malignant neoplasm of unspecified site of unspecified female breast: Secondary | ICD-10-CM

## 2012-07-08 DIAGNOSIS — D649 Anemia, unspecified: Secondary | ICD-10-CM

## 2012-07-08 DIAGNOSIS — C50911 Malignant neoplasm of unspecified site of right female breast: Secondary | ICD-10-CM

## 2012-07-08 DIAGNOSIS — C7951 Secondary malignant neoplasm of bone: Secondary | ICD-10-CM

## 2012-07-08 DIAGNOSIS — K219 Gastro-esophageal reflux disease without esophagitis: Secondary | ICD-10-CM

## 2012-07-08 LAB — CBC WITH DIFFERENTIAL/PLATELET
BASO%: 0.7 % (ref 0.0–2.0)
EOS%: 4.8 % (ref 0.0–7.0)
MCH: 30 pg (ref 25.1–34.0)
MCHC: 33.5 g/dL (ref 31.5–36.0)
RBC: 3.66 10*6/uL — ABNORMAL LOW (ref 3.70–5.45)
RDW: 15.5 % — ABNORMAL HIGH (ref 11.2–14.5)
lymph#: 2.1 10*3/uL (ref 0.9–3.3)

## 2012-07-08 LAB — COMPREHENSIVE METABOLIC PANEL (CC13)
ALT: 10 U/L (ref 0–55)
AST: 13 U/L (ref 5–34)
Albumin: 3.2 g/dL — ABNORMAL LOW (ref 3.5–5.0)
Calcium: 9.6 mg/dL (ref 8.4–10.4)
Chloride: 104 mEq/L (ref 98–107)
Creatinine: 1.3 mg/dL — ABNORMAL HIGH (ref 0.6–1.1)
Potassium: 5.3 mEq/L — ABNORMAL HIGH (ref 3.5–5.1)

## 2012-07-08 LAB — FERRITIN: Ferritin: 315 ng/mL — ABNORMAL HIGH (ref 10–291)

## 2012-07-08 MED ORDER — DARBEPOETIN ALFA-POLYSORBATE 200 MCG/0.4ML IJ SOLN: 200.0000 ug | Freq: Once | INTRAMUSCULAR | Status: DC

## 2012-07-15 ENCOUNTER — Telehealth: Payer: Self-pay

## 2012-07-15 NOTE — Telephone Encounter (Signed)
Pt left a message stating that she is in the doughnut hole and would like to know if we have an samples of Lantus.  I called and informed pt that I have put 2 samples in the fridge for her.  Pt states she will come in the morning to get RX's

## 2012-07-17 ENCOUNTER — Telehealth: Payer: Self-pay | Admitting: *Deleted

## 2012-07-17 NOTE — Telephone Encounter (Signed)
[facility] left detailed message on office voice mail concerning diabetic test form that was faxed back to them recently. Christoper Allegra stated that one box was checked for 4 x daily and another box was check 3 x daily. Christoper Allegra stated that patient told them that she only tests 2 x daily. Christoper Allegra stated that 3 x daily is the most that will be covered. Form needs to be to corrected, initialed and dated then faxed back. However if you desire patient to test more then 3x daily, you will need to fax medical records, chart notes, meter readings from the last 6 months.

## 2012-07-18 ENCOUNTER — Telehealth: Payer: Self-pay | Admitting: Family Medicine

## 2012-07-18 MED ORDER — LEVOTHYROXINE SODIUM 200 MCG PO TABS: 200.0000 ug | ORAL_TABLET | Freq: Every day | ORAL | Status: DC

## 2012-07-18 NOTE — Telephone Encounter (Signed)
Refill-levothyroxine sodium tab. Take one tablet by mouth every day. Qty 30 last fill 5.15.14

## 2012-07-18 NOTE — Telephone Encounter (Signed)
Paperwork given to MD and then we will fax back corrected form

## 2012-07-22 ENCOUNTER — Ambulatory Visit (HOSPITAL_BASED_OUTPATIENT_CLINIC_OR_DEPARTMENT_OTHER): Payer: Medicare Other | Admitting: Oncology

## 2012-07-22 ENCOUNTER — Other Ambulatory Visit (HOSPITAL_BASED_OUTPATIENT_CLINIC_OR_DEPARTMENT_OTHER): Payer: Medicare Other | Admitting: Lab

## 2012-07-22 ENCOUNTER — Ambulatory Visit: Payer: Medicare Other

## 2012-07-22 ENCOUNTER — Other Ambulatory Visit: Payer: Self-pay | Admitting: *Deleted

## 2012-07-22 ENCOUNTER — Telehealth: Payer: Self-pay | Admitting: Oncology

## 2012-07-22 VITALS — BP 148/74 | HR 75 | Temp 98.5°F | Resp 20 | Ht 63.0 in | Wt 233.3 lb

## 2012-07-22 DIAGNOSIS — C50911 Malignant neoplasm of unspecified site of right female breast: Secondary | ICD-10-CM

## 2012-07-22 DIAGNOSIS — C7951 Secondary malignant neoplasm of bone: Secondary | ICD-10-CM

## 2012-07-22 DIAGNOSIS — D630 Anemia in neoplastic disease: Secondary | ICD-10-CM

## 2012-07-22 DIAGNOSIS — K219 Gastro-esophageal reflux disease without esophagitis: Secondary | ICD-10-CM

## 2012-07-22 DIAGNOSIS — C50919 Malignant neoplasm of unspecified site of unspecified female breast: Secondary | ICD-10-CM

## 2012-07-22 DIAGNOSIS — D649 Anemia, unspecified: Secondary | ICD-10-CM

## 2012-07-22 DIAGNOSIS — C7952 Secondary malignant neoplasm of bone marrow: Secondary | ICD-10-CM

## 2012-07-22 DIAGNOSIS — D510 Vitamin B12 deficiency anemia due to intrinsic factor deficiency: Secondary | ICD-10-CM

## 2012-07-22 LAB — CBC WITH DIFFERENTIAL/PLATELET
BASO%: 0.8 % (ref 0.0–2.0)
EOS%: 4.2 % (ref 0.0–7.0)
LYMPH%: 24.8 % (ref 14.0–49.7)
MCHC: 33 g/dL (ref 31.5–36.0)
MCV: 90.1 fL (ref 79.5–101.0)
MONO#: 0.6 10*3/uL (ref 0.1–0.9)
MONO%: 7.9 % (ref 0.0–14.0)
Platelets: 261 10*3/uL (ref 145–400)
RBC: 3.35 10*6/uL — ABNORMAL LOW (ref 3.70–5.45)
WBC: 7.6 10*3/uL (ref 3.9–10.3)

## 2012-07-22 MED ORDER — CYANOCOBALAMIN 1000 MCG/ML IJ SOLN
1000.0000 ug | Freq: Once | INTRAMUSCULAR | Status: AC
  Administered 2012-07-22: 1000 ug via INTRAMUSCULAR

## 2012-07-22 MED ORDER — DARBEPOETIN ALFA-POLYSORBATE 200 MCG/0.4ML IJ SOLN
200.0000 ug | Freq: Once | INTRAMUSCULAR | Status: AC
  Administered 2012-07-22: 200 ug via SUBCUTANEOUS

## 2012-07-22 MED ORDER — OXYCODONE-ACETAMINOPHEN 5-325 MG PO TABS: 1.0000 | ORAL_TABLET | Freq: Four times a day (QID) | ORAL | Status: DC | PRN

## 2012-07-22 NOTE — Telephone Encounter (Signed)
, °

## 2012-07-22 NOTE — Progress Notes (Signed)
ID: [name]   DOB: Oct 15, 1940  MR#: [id number]  CSN#:[id number]  PCP: Danise Edge, MD GYN: SU: Emelia Loron OTHER MD: Cindra Eves, Dorothy Puffer, Caryn Bee Supple  HISTORY OF PRESENT ILLNESS: Brittney Cunningham was diagnosed with right breast carcinoma in May of 2011, a biopsy showing a grade 2 invasive ductal carcinoma, T2 NXM1, stage IV at presentation. There was bone only involvement. Tumor was strongly ER and PR positive, HER-2/neu negative, with an MIP-1 of 26%.  She was treated neoadjuvantly with letrozole and zoledronic acid beginning in June of 2011. She is status post right modified radical mastectomy in February 2012 for a ypT2 ypN2, grade 1 invasive ductal carcinoma. Margins were negative. Her subsequent history is as detailed below  INTERVAL HISTORY: Brittney Cunningham returns today for followup of her history of breast cancer. The interval history is remarkable for her grand-daughter having graduated from high school and been accepted at [employer]. She is hoping to got o medical school  REVIEW OF SYSTEMS: Brittney Cunningham is having more pain. Partly the problem is that she is in the "donut hole," and is cutting back on her medicined--we have asked her to meet with our financial counselors. She is moderateltconstipated. She is easily fatigued. Otherwise a detailed review of systems today was stable  PAST MEDICAL HISTORY: Past Medical History  Diagnosis Date  . Anemia   . Depression   . Diabetes mellitus type II   . GERD (gastroesophageal reflux disease)   . Hyperlipidemia   . Hypothyroidism   . Osteoarthritis   . Carotid artery stenosis     bilateral  . CAD (coronary artery disease)   . OSA (obstructive sleep apnea)   . Breast cancer     right breast cancer-invasive  ductal carcinoma (StageIV)  . Hypertension   . Anxiety   . Carotid artery occlusion   . Unspecified constipation 03/15/2012  . Bursitis   . Collagen vascular disease     PAST SURGICAL HISTORY: Past Surgical History  Procedure  Laterality Date  . Incisional breast biopsy      remoted left breast biopsy  . Cesarean section    . Other surgical history      GYN surgery  . Knee surgery      right knee surgery  . Carotid endarterectomy      right carotid  . Port-a-cath removal    . Modified mastectomy      Right breast  . Breast surgery    . Mastectomy    . Carotid stent      left    FAMILY HISTORY Family History  Problem Relation Age of Onset  . Arthritis    . Coronary artery disease      first degree relative  . Stroke      first degree relative  . Diabetes Mother   . Heart disease Mother   . Hypertension Mother   . Colon cancer Neg Hx   . Stomach cancer Neg Hx   The patient's father died from pancreatic cancer at the age of 71. The patient's mother died from complications of diabetes and heart disease at the age of 13. The patient has 2 sisters and 2 brothers. There is no history of breast cancer or ovarian cancer in the immediate family. One of the patient's paternal aunts out of 6 paternal aunts had breast cancer diagnosed in her 50s.    GYNECOLOGIC HISTORY: The patient is GX, P3. First pregnancy to term age 36. She went through the change of life  in her late 36s. She never took hormones.   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 Her husband,, [name], is disabled secondary to a fall. He has difficulty with walking. He used to work for [employer] previously. The patient's son, [name],  lives in [place], and works for [employer]. Daughter, [name], lives in [place], and works for [employer]. Daughter, [name],  lives in [place], [place], and works as a Statistician. The patient attends [employer].   ADVANCED DIRECTIVES:  HEALTH MAINTENANCE: History  Substance Use Topics  . Smoking status: Former Smoker -- 1.00 packs/day for 20 years    Types: Cigarettes    Quit date: 01/31/1988  . Smokeless tobacco: Never Used  . Alcohol  Use: No     Colonoscopy:  PAP:  Bone density: July 2011, Normal  Lipid panel: UTD  Allergies  Allergen Reactions  . Chlorhexidine Hives, Itching and Rash    This was most likely a CONTACT DERMATITIS versus true systemic allergic reaction  . Adhesive (Tape) Hives  . Codeine Swelling    Current Outpatient Prescriptions  Medication Sig Dispense Refill  . aspirin 81 MG chewable tablet Chew 81 mg by mouth daily.      Marland Kitchen atorvastatin (LIPITOR) 40 MG tablet Take 1 tablet (40 mg total) by mouth daily.  90 tablet  1  . carvedilol (COREG) 12.5 MG tablet Take 1 tablet (12.5 mg total) by mouth 2 (two) times daily with a meal.  60 tablet  11  . chlorhexidine (PERIDEX) 0.12 % solution Rinse for 90  seconds  with 15 mLs three times daily. Spit out excess. Do NOT swallow.      . clopidogrel (PLAVIX) 75 MG tablet Take 1 tablet (75 mg total) by mouth daily.  30 tablet  6  . cyanocobalamin (,VITAMIN B-12,) 1000 MCG/ML injection Inject 1,000 mcg into the muscle every 30 (thirty) days. Last dose 03/09/2011      . darbepoetin (ARANESP) 200 MCG/0.4ML SOLN Inject 200 mcg into the skin once. Every 2 weeks      . glycerin adult (GLYCERIN ADULT) 2 G SUPP Place 1 suppository rectally once as needed (constipation).  10 suppository  0  . hydrochlorothiazide (MICROZIDE) 12.5 MG capsule Take 1 capsule (12.5 mg total) by mouth daily.  30 capsule  6  . ibuprofen (ADVIL) 200 MG tablet Take 200 mg by mouth 3 (three) times daily.        . insulin glargine (LANTUS) 100 UNIT/ML injection Inject 60 Units into the skin at bedtime. Pt wants vials.  30 mL  3  . letrozole (FEMARA) 2.5 MG tablet Take 1 tablet (2.5 mg total) by mouth daily.  30 tablet  3  . levothyroxine (SYNTHROID, LEVOTHROID) 200 MCG tablet Take 1 tablet (200 mcg total) by mouth daily before breakfast.  30 tablet  3  . levothyroxine (SYNTHROID, LEVOTHROID) 75 MCG tablet Take 75 mcg by mouth daily.      Marland Kitchen losartan (COZAAR) 50 MG tablet Take 1 tablet (50 mg total)  by mouth daily.  30 tablet  6  . metFORMIN (GLUCOPHAGE) 500 MG tablet Take 1 tablet (500 mg total) by mouth every morning.  30 tablet  5  . oxyCODONE-acetaminophen (PERCOCET/ROXICET) 5-325 MG per tablet Take 1 tablet by mouth 4 (four) times daily as needed. For pain  120 tablet  0  . pantoprazole (PROTONIX) 40 MG tablet Take 1 tablet (40 mg total) by mouth daily.  30  tablet  3  . ranitidine (ZANTAC) 150 MG tablet Take 1 tablet (150 mg total) by mouth 2 (two) times daily.  30 tablet  3  . sennosides-docusate sodium (SENOKOT-S) 8.6-50 MG tablet 1 or 2 tabs by mouth daily      . [DISCONTINUED] simvastatin (ZOCOR) 40 MG tablet Take 40 mg by mouth every evening.       No current facility-administered medications for this visit.      Filed Vitals:   07/22/12 1106  BP: 148/74  Pulse: 75  Temp: 98.5 F (36.9 C)  Resp: 20     Body mass index is 41.34 kg/(m^2).    ECOG FS: 2  Filed Weights   07/22/12 1106  Weight: 233 lb 4.8 oz (105.824 kg)  Elderly white woman who appears fatigued  Oropharynx is clear, dentition is very poor No cervical or supraclavicular adenopathy Lungs no crackles or wheezes Heart regular rate and rhythm Abdomen soft, obese, nontender with positive bowel sounds Neuro: nonfocal, well oriented, positive affect  Breasts: Status post right modified radical mastectomy, with no evidence of local recurrence. The right axilla is benign. The left breast is unremarkable.  LAB RESULTS: Lab Results  Component Value Date   WBC 7.6 07/22/2012   NEUTROABS 4.7 07/22/2012   HGB 10.0* 07/22/2012   HCT 30.2* 07/22/2012   MCV 90.1 07/22/2012   PLT 261 07/22/2012      Chemistry      Component Value Date/Time   NA 137 07/08/2012 1023   NA 135 04/13/2012 1909   K 5.3* 07/08/2012 1023   K 4.5 04/13/2012 1909   CL 104 07/08/2012 1023   CL 99 04/13/2012 1909   CO2 24 07/08/2012 1023   CO2 24 04/13/2012 1909   BUN 30.0* 07/08/2012 1023   BUN 29* 04/13/2012 1909   CREATININE 1.3* 07/08/2012 1023    CREATININE 0.90 04/13/2012 1909      Component Value Date/Time   CALCIUM 9.6 07/08/2012 1023   CALCIUM 9.5 04/13/2012 1909   ALKPHOS 169* 07/08/2012 1023   ALKPHOS 126* 06/26/2012 1033   AST 13 07/08/2012 1023   AST 14 06/26/2012 1033   ALT 10 07/08/2012 1023   ALT <8 06/26/2012 1033   BILITOT 0.37 07/08/2012 1023   BILITOT 0.3 06/26/2012 1033      Lab Results  Component Value Date   LABCA2 88* 09/28/2011    STUDIES: No results found.  ASSESSMENT: 72 y.o. Brittney Cunningham woman   (1) status post right breast biopsy May of 2011 for a grade 2 invasive ductal carcinoma, T2 NX M1, Stage IV, with bone-only involvement, strongly estrogen and progesterone receptor-positive, HER-2-negative with an MIB-1 of 26%.   (2) Neoadjuvantly she received letrozole and zoledronic acid beginning in June of 2011 and underwent right modified radical mastectomy February of 2012 for a ypT2 ypN2, grade 1 invasive ductal carcinoma with negative margins.   (3) She completed radiation therapy in August of 2012.   (4) She has continued on letrozole but was switched from zoledronic acid to denosumab Rivka Barbara) because of concerns regarding her serum creatinine. Rivka Barbara was being given every 8 weeks.  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  (6) symptomatic anemia, with creatinine clearance < 60 cc/min; darbepoietin Q14d started 11/03/2011; received feraheme 01/05/2012  (7) on subcutaneous B-12 supplementation monthly  (8) Pain in left upper extremity with osseous metastasis, osteoarthritis, tendinopathy, and bursitis as confirmed by recent MRI  (9) anemia, multifactorial, on Aranesp every  2 weeks    PLAN: I discussed the use of strontium or similar preparations for her pain, but she is concerned regarding the financial issues and wants to wait unti January (when she will be "out of the hole." I scheduled her for the Q2week shots and set her up for a return visit in 2 months, with a CXR  just before the visit. She knows to call for any proboems that may deelop before then,

## 2012-07-22 NOTE — Addendum Note (Signed)
Addended by: Billey Co on: 07/22/2012 05:35 PM   Modules accepted: Orders

## 2012-07-23 ENCOUNTER — Other Ambulatory Visit: Payer: Self-pay | Admitting: *Deleted

## 2012-07-23 ENCOUNTER — Telehealth: Payer: Self-pay | Admitting: *Deleted

## 2012-07-23 MED ORDER — HYDROCODONE-ACETAMINOPHEN 5-325 MG PO TABS: 2.0000 | ORAL_TABLET | Freq: Four times a day (QID) | ORAL | Status: DC | PRN

## 2012-07-23 NOTE — Telephone Encounter (Signed)
Pt called to this RN to state she spoke with her pharmacist regarding cost of pain medication with the hydrocodone apap 5/325mg  1/2 the cost of percocet.  Brittney Cunningham is requesting a prescription for the above.  Note pt discussed pain medication cost concerns with MD at appointment 6/23 with his approval for pt to obtain her choice of medication.  Per discussion with pt today as well as informed pharmacist, this RN requested pt to submit percocet prescription to pharmacy so if needed she can obtain if needed for pain not controlled by the hydrocodone.

## 2012-08-01 ENCOUNTER — Encounter: Payer: Self-pay | Admitting: Surgery

## 2012-08-05 ENCOUNTER — Ambulatory Visit: Payer: Self-pay | Admitting: Surgery

## 2012-08-05 ENCOUNTER — Other Ambulatory Visit (HOSPITAL_BASED_OUTPATIENT_CLINIC_OR_DEPARTMENT_OTHER): Payer: Medicare Other | Admitting: Lab

## 2012-08-05 ENCOUNTER — Other Ambulatory Visit: Payer: Medicare Other

## 2012-08-05 ENCOUNTER — Ambulatory Visit (HOSPITAL_BASED_OUTPATIENT_CLINIC_OR_DEPARTMENT_OTHER): Payer: Medicare Other

## 2012-08-05 VITALS — BP 147/44 | HR 71 | Temp 97.9°F | Resp 24

## 2012-08-05 DIAGNOSIS — C50919 Malignant neoplasm of unspecified site of unspecified female breast: Secondary | ICD-10-CM

## 2012-08-05 DIAGNOSIS — K219 Gastro-esophageal reflux disease without esophagitis: Secondary | ICD-10-CM

## 2012-08-05 DIAGNOSIS — C7951 Secondary malignant neoplasm of bone: Secondary | ICD-10-CM

## 2012-08-05 DIAGNOSIS — D630 Anemia in neoplastic disease: Secondary | ICD-10-CM

## 2012-08-05 DIAGNOSIS — D649 Anemia, unspecified: Secondary | ICD-10-CM

## 2012-08-05 LAB — COMPREHENSIVE METABOLIC PANEL (CC13)
AST: 12 U/L (ref 5–34)
Albumin: 3.2 g/dL — ABNORMAL LOW (ref 3.5–5.0)
Alkaline Phosphatase: 124 U/L (ref 40–150)
Glucose: 173 mg/dl — ABNORMAL HIGH (ref 70–140)
Potassium: 4.9 mEq/L (ref 3.5–5.1)
Sodium: 139 mEq/L (ref 136–145)
Total Bilirubin: 0.3 mg/dL (ref 0.20–1.20)
Total Protein: 7.3 g/dL (ref 6.4–8.3)

## 2012-08-05 LAB — CBC WITH DIFFERENTIAL/PLATELET
BASO%: 0.7 % (ref 0.0–2.0)
MCHC: 32.8 g/dL (ref 31.5–36.0)
MONO#: 0.7 10*3/uL (ref 0.1–0.9)
RBC: 3.52 10*6/uL — ABNORMAL LOW (ref 3.70–5.45)
RDW: 15.6 % — ABNORMAL HIGH (ref 11.2–14.5)
WBC: 8.3 10*3/uL (ref 3.9–10.3)
lymph#: 2.3 10*3/uL (ref 0.9–3.3)

## 2012-08-05 LAB — FERRITIN CHCC: Ferritin: 271 ng/ml — ABNORMAL HIGH (ref 9–269)

## 2012-08-05 MED ORDER — DARBEPOETIN ALFA-POLYSORBATE 200 MCG/0.4ML IJ SOLN
200.0000 ug | Freq: Once | INTRAMUSCULAR | Status: AC
  Administered 2012-08-05: 200 ug via SUBCUTANEOUS
  Filled 2012-08-05: qty 0.4

## 2012-08-05 NOTE — Patient Instructions (Signed)
Darbepoetin Alfa injection What is this medicine? DARBEPOETIN ALFA (dar be POE e tin AL fa) helps your body make more red blood cells. It is used to treat anemia caused by chronic kidney failure and chemotherapy. This medicine may be used for other purposes; ask your health care provider or pharmacist if you have questions. What should I tell my health care provider before I take this medicine? They need to know if you have any of these conditions: -blood clotting disorders or history of blood clots -cancer patient not on chemotherapy -cystic fibrosis -heart disease, such as angina, heart failure, or a history of a heart attack -hemoglobin level of 12 g/dL or greater -high blood pressure -low levels of folate, iron, or vitamin B12 -seizures -an unusual or allergic reaction to darbepoetin, erythropoietin, albumin, hamster proteins, latex, other medicines, foods, dyes, or preservatives -pregnant or trying to get pregnant -breast-feeding How should I use this medicine? This medicine is for injection into a vein or under the skin. It is usually given by a health care professional in a hospital or clinic setting. If you get this medicine at home, you will be taught how to prepare and give this medicine. Do not shake the solution before you withdraw a dose. Use exactly as directed. Take your medicine at regular intervals. Do not take your medicine more often than directed. It is important that you put your used needles and syringes in a special sharps container. Do not put them in a trash can. If you do not have a sharps container, call your pharmacist or healthcare provider to get one. Talk to your pediatrician regarding the use of this medicine in children. While this medicine may be used in children as young as 1 year for selected conditions, precautions do apply. Overdosage: If you think you have taken too much of this medicine contact a poison control center or emergency room at once. NOTE:  This medicine is only for you. Do not share this medicine with others. What if I miss a dose? If you miss a dose, take it as soon as you can. If it is almost time for your next dose, take only that dose. Do not take double or extra doses. What may interact with this medicine? Do not take this medicine with any of the following medications: -epoetin alfa This list may not describe all possible interactions. Give your health care provider a list of all the medicines, herbs, non-prescription drugs, or dietary supplements you use. Also tell them if you smoke, drink alcohol, or use illegal drugs. Some items may interact with your medicine. What should I watch for while using this medicine? Visit your prescriber or health care professional for regular checks on your progress and for the needed blood tests and blood pressure measurements. It is especially important for the doctor to make sure your hemoglobin level is in the desired range, to limit the risk of potential side effects and to give you the best benefit. Keep all appointments for any recommended tests. Check your blood pressure as directed. Ask your doctor what your blood pressure should be and when you should contact him or her. As your body makes more red blood cells, you may need to take iron, folic acid, or vitamin B supplements. Ask your doctor or health care provider which products are right for you. If you have kidney disease continue dietary restrictions, even though this medication can make you feel better. Talk with your doctor or health care professional about the   foods you eat and the vitamins that you take. What side effects may I notice from receiving this medicine? Side effects that you should report to your doctor or health care professional as soon as possible: -allergic reactions like skin rash, itching or hives, swelling of the face, lips, or tongue -breathing problems -changes in vision -chest pain -confusion, trouble speaking  or understanding -feeling faint or lightheaded, falls -high blood pressure -muscle aches or pains -pain, swelling, warmth in the leg -rapid weight gain -severe headaches -sudden numbness or weakness of the face, arm or leg -trouble walking, dizziness, loss of balance or coordination -seizures (convulsions) -swelling of the ankles, feet, hands -unusually weak or tired Side effects that usually do not require medical attention (report to your doctor or health care professional if they continue or are bothersome): -diarrhea -fever, chills (flu-like symptoms) -headaches -nausea, vomiting -redness, stinging, or swelling at site where injected This list may not describe all possible side effects. Call your doctor for medical advice about side effects. You may report side effects to FDA at 1-800-FDA-1088. Where should I keep my medicine? Keep out of the reach of children. Store in a refrigerator between 2 and 8 degrees C (36 and 46 degrees F). Do not freeze. Do not shake. Throw away any unused portion if using a single-dose vial. Throw away any unused medicine after the expiration date. NOTE: This sheet is a summary. It may not cover all possible information. If you have questions about this medicine, talk to your doctor, pharmacist, or health care provider.  2013, [employer]. (12/31/2007 10:23:57 AM)  

## 2012-08-09 ENCOUNTER — Telehealth: Payer: Self-pay | Admitting: Family Medicine

## 2012-08-09 MED ORDER — LEVOTHYROXINE SODIUM 75 MCG PO TABS: 75.0000 ug | ORAL_TABLET | Freq: Every day | ORAL | Status: DC

## 2012-08-09 NOTE — Telephone Encounter (Signed)
Refill- levothyroxine sodium tab. Take one tablet by mouth every day(take with tablet). Qty 30 last fill 5.15.14

## 2012-08-19 ENCOUNTER — Ambulatory Visit (HOSPITAL_BASED_OUTPATIENT_CLINIC_OR_DEPARTMENT_OTHER): Payer: Medicare Other

## 2012-08-19 ENCOUNTER — Other Ambulatory Visit (HOSPITAL_BASED_OUTPATIENT_CLINIC_OR_DEPARTMENT_OTHER): Payer: Medicare Other

## 2012-08-19 VITALS — BP 126/60 | HR 70 | Temp 98.3°F

## 2012-08-19 DIAGNOSIS — C7951 Secondary malignant neoplasm of bone: Secondary | ICD-10-CM

## 2012-08-19 DIAGNOSIS — C50919 Malignant neoplasm of unspecified site of unspecified female breast: Secondary | ICD-10-CM

## 2012-08-19 DIAGNOSIS — C50911 Malignant neoplasm of unspecified site of right female breast: Secondary | ICD-10-CM

## 2012-08-19 DIAGNOSIS — D649 Anemia, unspecified: Secondary | ICD-10-CM

## 2012-08-19 DIAGNOSIS — K219 Gastro-esophageal reflux disease without esophagitis: Secondary | ICD-10-CM

## 2012-08-19 DIAGNOSIS — D630 Anemia in neoplastic disease: Secondary | ICD-10-CM

## 2012-08-19 LAB — CBC WITH DIFFERENTIAL/PLATELET
Basophils Absolute: 0 10*3/uL (ref 0.0–0.1)
EOS%: 3.8 % (ref 0.0–7.0)
Eosinophils Absolute: 0.2 10*3/uL (ref 0.0–0.5)
HGB: 10.7 g/dL — ABNORMAL LOW (ref 11.6–15.9)
LYMPH%: 24.9 % (ref 14.0–49.7)
MCH: 29.4 pg (ref 25.1–34.0)
MCV: 90.1 fL (ref 79.5–101.0)
MONO%: 8.2 % (ref 0.0–14.0)
NEUT#: 3.9 10*3/uL (ref 1.5–6.5)
Platelets: 267 10*3/uL (ref 145–400)
RBC: 3.63 10*6/uL — ABNORMAL LOW (ref 3.70–5.45)

## 2012-08-19 MED ORDER — DARBEPOETIN ALFA-POLYSORBATE 200 MCG/0.4ML IJ SOLN
200.0000 ug | Freq: Once | INTRAMUSCULAR | Status: AC
  Administered 2012-08-19: 200 ug via SUBCUTANEOUS
  Filled 2012-08-19: qty 0.4

## 2012-08-19 MED ORDER — CYANOCOBALAMIN 1000 MCG/ML IJ SOLN
1000.0000 ug | Freq: Once | INTRAMUSCULAR | Status: AC
  Administered 2012-08-19: 1000 ug via INTRAMUSCULAR

## 2012-08-20 ENCOUNTER — Other Ambulatory Visit: Payer: Self-pay | Admitting: *Deleted

## 2012-08-20 MED ORDER — LOSARTAN POTASSIUM 50 MG PO TABS: 50.0000 mg | ORAL_TABLET | Freq: Every day | ORAL | Status: DC

## 2012-08-20 NOTE — Telephone Encounter (Signed)
Rx request to pharmacy/[facility]  

## 2012-08-26 ENCOUNTER — Telehealth: Payer: Self-pay

## 2012-08-26 NOTE — Telephone Encounter (Signed)
Pt left a vm stating that she is in the doughnut hole and would like to know if we have any Insulin samples?  I looked and we don't have any Lantus vials/pens.  Pt informed

## 2012-08-30 ENCOUNTER — Other Ambulatory Visit: Payer: Self-pay | Admitting: *Deleted

## 2012-08-30 DIAGNOSIS — C50911 Malignant neoplasm of unspecified site of right female breast: Secondary | ICD-10-CM

## 2012-08-30 MED ORDER — OXYCODONE-ACETAMINOPHEN 5-325 MG PO TABS: 1.0000 | ORAL_TABLET | Freq: Four times a day (QID) | ORAL | Status: DC | PRN

## 2012-09-02 ENCOUNTER — Other Ambulatory Visit (HOSPITAL_BASED_OUTPATIENT_CLINIC_OR_DEPARTMENT_OTHER): Payer: Medicare Other | Admitting: Lab

## 2012-09-02 ENCOUNTER — Ambulatory Visit (HOSPITAL_BASED_OUTPATIENT_CLINIC_OR_DEPARTMENT_OTHER): Payer: Medicare Other

## 2012-09-02 VITALS — BP 118/60 | HR 62 | Temp 98.2°F

## 2012-09-02 DIAGNOSIS — C50911 Malignant neoplasm of unspecified site of right female breast: Secondary | ICD-10-CM

## 2012-09-02 DIAGNOSIS — D649 Anemia, unspecified: Secondary | ICD-10-CM

## 2012-09-02 DIAGNOSIS — C50919 Malignant neoplasm of unspecified site of unspecified female breast: Secondary | ICD-10-CM

## 2012-09-02 DIAGNOSIS — D630 Anemia in neoplastic disease: Secondary | ICD-10-CM

## 2012-09-02 DIAGNOSIS — K219 Gastro-esophageal reflux disease without esophagitis: Secondary | ICD-10-CM

## 2012-09-02 DIAGNOSIS — C7951 Secondary malignant neoplasm of bone: Secondary | ICD-10-CM

## 2012-09-02 LAB — CBC WITH DIFFERENTIAL/PLATELET
Basophils Absolute: 0 10*3/uL (ref 0.0–0.1)
EOS%: 4.1 % (ref 0.0–7.0)
Eosinophils Absolute: 0.3 10*3/uL (ref 0.0–0.5)
HCT: 33 % — ABNORMAL LOW (ref 34.8–46.6)
HGB: 10.7 g/dL — ABNORMAL LOW (ref 11.6–15.9)
MCH: 29.1 pg (ref 25.1–34.0)
MCV: 89.5 fL (ref 79.5–101.0)
NEUT#: 5.5 10*3/uL (ref 1.5–6.5)
NEUT%: 66.5 % (ref 38.4–76.8)
RDW: 15.3 % — ABNORMAL HIGH (ref 11.2–14.5)
lymph#: 1.8 10*3/uL (ref 0.9–3.3)

## 2012-09-02 LAB — COMPREHENSIVE METABOLIC PANEL (CC13)
Albumin: 3.1 g/dL — ABNORMAL LOW (ref 3.5–5.0)
Alkaline Phosphatase: 120 U/L (ref 40–150)
BUN: 32.5 mg/dL — ABNORMAL HIGH (ref 7.0–26.0)
CO2: 19 mEq/L — ABNORMAL LOW (ref 22–29)
Calcium: 9.4 mg/dL (ref 8.4–10.4)
Glucose: 181 mg/dl — ABNORMAL HIGH (ref 70–140)
Potassium: 5.1 mEq/L (ref 3.5–5.1)
Sodium: 137 mEq/L (ref 136–145)
Total Protein: 7.1 g/dL (ref 6.4–8.3)

## 2012-09-02 MED ORDER — DARBEPOETIN ALFA-POLYSORBATE 200 MCG/0.4ML IJ SOLN
200.0000 ug | Freq: Once | INTRAMUSCULAR | Status: AC
  Administered 2012-09-02: 200 ug via SUBCUTANEOUS
  Filled 2012-09-02: qty 0.4

## 2012-09-04 ENCOUNTER — Telehealth: Payer: Self-pay

## 2012-09-04 NOTE — Telephone Encounter (Signed)
Patient called asking for samples of Lantus.   I called and informed patient that we had no vials (like she likes) but I did have 2 Lantus pens. Pt stated she will come up tomorrow to pick up.  I also informed pt (per Lantus rep) that she could call 1-800-Medicare and/or visit the manufactures website at visitspconline.com  I also put this information in with samples

## 2012-09-06 ENCOUNTER — Encounter: Payer: Self-pay | Admitting: Surgery

## 2012-09-09 ENCOUNTER — Other Ambulatory Visit (INDEPENDENT_AMBULATORY_CARE_PROVIDER_SITE_OTHER): Payer: Medicare Other | Admitting: *Deleted

## 2012-09-09 ENCOUNTER — Ambulatory Visit (INDEPENDENT_AMBULATORY_CARE_PROVIDER_SITE_OTHER): Payer: Medicare Other | Admitting: Surgery

## 2012-09-09 ENCOUNTER — Encounter: Payer: Self-pay | Admitting: Surgery

## 2012-09-09 DIAGNOSIS — Z48812 Encounter for surgical aftercare following surgery on the circulatory system: Secondary | ICD-10-CM

## 2012-09-09 DIAGNOSIS — I6529 Occlusion and stenosis of unspecified carotid artery: Secondary | ICD-10-CM

## 2012-09-09 NOTE — Progress Notes (Signed)
[facility]   Patient name: Brittney Cunningham MRN: [id number] DOB: Nov 10, 1940 Sex: female     Chief Complaint  Patient presents with  . Re-evaluation    1 year f/u carotid - pt c/o numbness/"quivering" in left side of neck several times daily X 2 months    HISTORY OF PRESENT ILLNESS: The patient is back today for followup. She is status post right carotid endarterectomy in 2011. She is status post left carotid stent in 2013. She denies having any neurologic symptoms. She denies numbness or weakness in either extremity. She denies slurred speech. She denies her as is the case.  The patient has been diagnosed with anemia. She does feel weak at times but is trying to work for on this. She continues to take an aspirin and Plavix. She continues to take her statins for her hypercholesterolemia.  Past Medical History  Diagnosis Date  . Anemia   . Depression   . Diabetes mellitus type II   . GERD (gastroesophageal reflux disease)   . Hyperlipidemia   . Hypothyroidism   . Osteoarthritis   . Carotid artery stenosis     bilateral  . CAD (coronary artery disease)   . OSA (obstructive sleep apnea)   . Breast cancer     right breast cancer-invasive  ductal carcinoma (StageIV)  . Hypertension   . Anxiety   . Carotid artery occlusion   . Unspecified constipation 03/15/2012  . Bursitis   . Collagen vascular disease     Past Surgical History  Procedure Laterality Date  . Incisional breast biopsy      remoted left breast biopsy  . Cesarean section    . Other surgical history      GYN surgery  . Knee surgery      right knee surgery  . Carotid endarterectomy      right carotid  . Port-a-cath removal    . Modified mastectomy      Right breast  . Breast surgery    . Mastectomy    . Carotid stent      left    History   Social History  . Marital Status: Married    Spouse Name: N/A    Number of Children: 3  . Years of Education: N/A   Occupational  History  . 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 Smoking status: Former Smoker -- 1.00 packs/day for 20 years    Types: Cigarettes    Quit date: 01/31/1988  . Smokeless tobacco: Never Used  . Alcohol Use: No  . Drug Use: No  . Sexually Active: Not on file   Other Topics Concern  . Not on file   Social History Narrative   Retired-ran a daycare facility for 40 years.   Married- 43 years    2 sons    1 daughter - Engineer, civil (consulting) midwife   Former smoker-quit in [place].     Family History  Problem Relation Age of Onset  . Arthritis    . Coronary artery disease      first degree relative  . Stroke      first degree relative  . Diabetes Mother   . Heart disease Mother   . Hypertension Mother   . Cancer Mother   . Deep vein thrombosis Mother   . Colon cancer Neg Hx   . Stomach cancer Neg Hx   . Cancer Father  Allergies as of 09/09/2012 - Review Complete 09/09/2012  Allergen Reaction Noted  . Chlorhexidine Hives, Itching, and Rash 01/05/2011  . Adhesive (tape) Hives 03/06/2011  . Codeine Swelling     Current Outpatient Prescriptions on File Prior to Visit  Medication Sig Dispense Refill  . aspirin 81 MG chewable tablet Chew 81 mg by mouth daily.      Marland Kitchen atorvastatin (LIPITOR) 40 MG tablet Take 1 tablet (40 mg total) by mouth daily.  90 tablet  1  . carvedilol (COREG) 12.5 MG tablet Take 1 tablet (12.5 mg total) by mouth 2 (two) times daily with a meal.  60 tablet  11  . chlorhexidine (PERIDEX) 0.12 % solution Rinse for 90  seconds  with 15 mLs three times daily. Spit out excess. Do NOT swallow.      . clopidogrel (PLAVIX) 75 MG tablet Take 1 tablet (75 mg total) by mouth daily.  30 tablet  6  . cyanocobalamin (,VITAMIN B-12,) 1000 MCG/ML injection Inject 1,000 mcg into the muscle every 30 (thirty) days. Last dose 03/09/2011      . darbepoetin (ARANESP) 200 MCG/0.4ML SOLN Inject 200 mcg into the skin once. Every 2 weeks      . hydrochlorothiazide  (MICROZIDE) 12.5 MG capsule Take 1 capsule (12.5 mg total) by mouth daily.  30 capsule  6  . HYDROcodone-acetaminophen (NORCO/VICODIN) 5-325 MG per tablet Take 2 tablets by mouth every 6 (six) hours as needed for pain.  120 tablet  1  . ibuprofen (ADVIL) 200 MG tablet Take 200 mg by mouth 3 (three) times daily.        . insulin glargine (LANTUS) 100 UNIT/ML injection Inject 60 Units into the skin at bedtime. Pt wants vials.  30 mL  3  . letrozole (FEMARA) 2.5 MG tablet Take 1 tablet (2.5 mg total) by mouth daily.  30 tablet  3  . levothyroxine (SYNTHROID, LEVOTHROID) 200 MCG tablet Take 1 tablet (200 mcg total) by mouth daily before breakfast.  30 tablet  3  . levothyroxine (SYNTHROID, LEVOTHROID) 75 MCG tablet Take 1 tablet (75 mcg total) by mouth daily.  30 tablet  3  . losartan (COZAAR) 50 MG tablet Take 1 tablet (50 mg total) by mouth daily.  30 tablet  5  . metFORMIN (GLUCOPHAGE) 500 MG tablet Take 1 tablet (500 mg total) by mouth every morning.  30 tablet  5  . oxyCODONE-acetaminophen (PERCOCET/ROXICET) 5-325 MG per tablet Take 1 tablet by mouth 4 (four) times daily as needed. For pain  30 tablet  0  . glycerin adult (GLYCERIN ADULT) 2 G SUPP Place 1 suppository rectally once as needed (constipation).  10 suppository  0  . pantoprazole (PROTONIX) 40 MG tablet Take 1 tablet (40 mg total) by mouth daily.  30 tablet  3  . ranitidine (ZANTAC) 150 MG tablet Take 1 tablet (150 mg total) by mouth 2 (two) times daily.  30 tablet  3  . sennosides-docusate sodium (SENOKOT-S) 8.6-50 MG tablet 1 or 2 tabs by mouth daily      . [DISCONTINUED] simvastatin (ZOCOR) 40 MG tablet Take 40 mg by mouth every evening.       No current facility-administered medications on file prior to visit.     REVIEW OF SYSTEMS: Please see history of present illness, otherwise no changes from prior visit  PHYSICAL EXAMINATION:   Vital signs are BP   Pulse 74  Ht 5\' 3"  (1.6 m)  Wt 228 lb 9.6 oz (103.692 kg)  BMI 40.5  kg/m2  SpO2 100%  LMP 03/13/2012 General: The patient appears their stated age. HEENT:  No gross abnormalities Pulmonary:  Non labored breathing Musculoskeletal: There are no major deformities. Neurologic: No focal weakness or paresthesias are detected, Skin: There are no ulcer or rashes noted. Psychiatric: The patient has normal affect. Cardiovascular: There is a regular rate and rhythm without significant murmur appreciated. No carotid bruits   Diagnostic Studies Duplex ultrasounds her interview. Less than 40% right carotid stenosis is identified. Left carotid stent is widely patent  Assessment: Status post bilateral carotid revascularization Plan: The patient will follow up in one year with a repeat carotid duplex.  Jorge Ny, M.D. [facility] Office: [phone] Pager:  [phone]  VASCULAR QUALITY INITIATIVE FOLLOW UP DATA:  Current smoker: [  ] yes  [ x ] no  Living status: [ x ]  Home  [  ] Nursing home  [  ] Homeless    MEDS:  ASA [ x ] yes  [  ] no- [  ] medical reason  [  ] non compliant  STATIN  [ x ] yes  [  ] no- [  ] medical reason  [  ] non compliant  Beta blocker [ x ] yes  [  ] no- [  ] medical reason  [  ] non compliant  ACE inhibitor [  ] yes  [ x ] no- [ x ] medical reason  [  ] non compliant  P2Y12 Antagonist [  ] none  [  x] clopidogrel-Plavix  [  ] ticlopidine-Ticlid   [  ] prasugrel-Effient  [  ] ticagrelor- Brilinta    Anticoagulant [x  ] None  [  ] warfarin  [  ] rivaroxaban-Xarelto [  ] dabigatran- Pradaxa  Neurologic event since D/C:  [  x] no  [  ] yes: [  ] eye event  [  ] cortical event  [  ] VB event  [  ] non specific event  [  ] right  [  ] left  [  ] TIA  [  ] stroke  Date:   Modified Rankin Score: 0  MI since D/C: [ x ] no  [  ] troponin only  [  ] EKG or clinical  Cranial nerve injury: [ x ] none  [  ] resolved  [  ] persistent  Duplex CEA site: [  ] no  [ x ] yes - PSV= 83  EDV=  15  ICA/CCA ratio: 0.56  Stenosis= [ x ] <40% [  ] 40-59% [  ] 60-79%  [  ] > 80%  [  ]  Occluded  CEA site re-operation:  [x  ] no   [  ] yes- date of re-op:  CEA site PCI:   [ x ] no   [  ] yes- date of PCI: x

## 2012-09-16 ENCOUNTER — Ambulatory Visit (INDEPENDENT_AMBULATORY_CARE_PROVIDER_SITE_OTHER): Payer: Medicare Other | Admitting: Family Medicine

## 2012-09-16 ENCOUNTER — Other Ambulatory Visit (HOSPITAL_BASED_OUTPATIENT_CLINIC_OR_DEPARTMENT_OTHER): Payer: Medicare Other | Admitting: Lab

## 2012-09-16 ENCOUNTER — Ambulatory Visit (HOSPITAL_BASED_OUTPATIENT_CLINIC_OR_DEPARTMENT_OTHER): Payer: Medicare Other

## 2012-09-16 ENCOUNTER — Encounter: Payer: Self-pay | Admitting: Family Medicine

## 2012-09-16 VITALS — BP 140/56 | HR 74 | Temp 98.3°F

## 2012-09-16 VITALS — BP 112/62 | HR 80 | Temp 98.6°F | Ht 63.0 in | Wt 227.1 lb

## 2012-09-16 DIAGNOSIS — D649 Anemia, unspecified: Secondary | ICD-10-CM

## 2012-09-16 DIAGNOSIS — C50919 Malignant neoplasm of unspecified site of unspecified female breast: Secondary | ICD-10-CM

## 2012-09-16 DIAGNOSIS — I10 Essential (primary) hypertension: Secondary | ICD-10-CM

## 2012-09-16 DIAGNOSIS — C50911 Malignant neoplasm of unspecified site of right female breast: Secondary | ICD-10-CM

## 2012-09-16 DIAGNOSIS — E119 Type 2 diabetes mellitus without complications: Secondary | ICD-10-CM

## 2012-09-16 DIAGNOSIS — M1990 Unspecified osteoarthritis, unspecified site: Secondary | ICD-10-CM

## 2012-09-16 DIAGNOSIS — K219 Gastro-esophageal reflux disease without esophagitis: Secondary | ICD-10-CM

## 2012-09-16 LAB — CBC WITH DIFFERENTIAL/PLATELET
Basophils Absolute: 0.1 10*3/uL (ref 0.0–0.1)
Eosinophils Absolute: 0.3 10*3/uL (ref 0.0–0.5)
HGB: 10.4 g/dL — ABNORMAL LOW (ref 11.6–15.9)
MONO#: 0.7 10*3/uL (ref 0.1–0.9)
MONO%: 8.9 % (ref 0.0–14.0)
NEUT#: 4.7 10*3/uL (ref 1.5–6.5)
RBC: 3.57 10*6/uL — ABNORMAL LOW (ref 3.70–5.45)
RDW: 15.8 % — ABNORMAL HIGH (ref 11.2–14.5)
WBC: 8.2 10*3/uL (ref 3.9–10.3)
lymph#: 2.5 10*3/uL (ref 0.9–3.3)

## 2012-09-16 LAB — FERRITIN CHCC: Ferritin: 343 ng/ml — ABNORMAL HIGH (ref 9–269)

## 2012-09-16 MED ORDER — DARBEPOETIN ALFA-POLYSORBATE 200 MCG/0.4ML IJ SOLN
200.0000 ug | Freq: Once | INTRAMUSCULAR | Status: AC
  Administered 2012-09-16: 200 ug via SUBCUTANEOUS
  Filled 2012-09-16: qty 0.4

## 2012-09-16 MED ORDER — CYANOCOBALAMIN 1000 MCG/ML IJ SOLN
1000.0000 ug | Freq: Once | INTRAMUSCULAR | Status: AC
  Administered 2012-09-16: 1000 ug via INTRAMUSCULAR

## 2012-09-16 NOTE — Patient Instructions (Addendum)
Try Turmeric instead of the Advil for the next month  Sacroiliac Joint Dysfunction The sacroiliac joint connects the lower part of the spine (the sacrum) with the bones of the pelvis. CAUSES  Sometimes, there is no obvious reason for sacroiliac joint dysfunction. Other times, it may occur   During pregnancy.  After injury, such as:  Car accidents.  Sport-related injuries.  Work-related injuries.  Due to one leg being shorter than the other.  Due to other conditions that affect the joints, such as:  Rheumatoid arthritis.  Gout.  Psoriasis.  Joint infection (septic arthritis). SYMPTOMS  Symptoms may include:  Pain in the:  Lower back.  Buttocks.  Groin.  Thighs and legs.  Difficult sitting, standing, walking, lying, bending or lifting. DIAGNOSIS  A number of tests may be used to help diagnose the cause of sacroiliac joint dysfunction, including:  Imaging tests to look for other causes of pain, including:  MRI.  CT scan.  Bone scan.  Diagnostic injection: During a special x-ray (called fluoroscopy), a needle is put into the sacroiliac joint. A numbing medicine is injected into the joint. If the pain is improved or stopped, the diagnosis of sacroiliac joint dysfunction is more likely. TREATMENT  There are a number of types of treatment used for sacroiliac joint dysfunction, including:  Only take over-the-counter or prescription medicines for pain, discomfort, or fever as directed by your caregiver.  Medications to relax muscles.  Rest. Decreasing activity can help cut down on painful muscle spasms and allow the back to heal.  Application of heat or ice to the lower back may improve muscle spasms and soothe pain.  Brace. A special back brace, called a sacroiliac belt, can help support the joint while your back is healing.  Physical therapy can help teach comfortable positions and exercises to strengthen muscles that support the sacroiliac  joint.  Cortisone injections. Injections of steroid medicine into the joint can help decrease swelling and improve pain.  Hyaluronic acid injections. This chemical improves lubrication within the sacroiliac joint, thereby decreasing pain.  Radiofrequency ablation. A special needle is placed into the joint, where it burns away nerves that are carrying pain messages from the joint.  Surgery. Because pain occurs during movement of the joint, screws and plates may be installed in order to limit or prevent joint motion. HOME CARE INSTRUCTIONS   Take all medications exactly as directed.  Follow instructions regarding both rest and physical activity, to avoid worsening the pain.  Do physical therapy exercises exactly as prescribed. SEEK IMMEDIATE MEDICAL CARE IF:  You experience increasingly severe pain.  You develop new symptoms, such as numbness or tingling in your legs or feet.  You lose bladder or bowel control. Document Released: 04/14/2008 Document Revised: 04/10/2011 Document Reviewed: 04/14/2008 [facility] Patient Information 2014 [place], [place].  Thoracic Outlet Syndrome Thoracic outlet syndrome is a condition caused by the compressing of the nerves and blood vessels between the muscles of the neck and the shoulders. Work activities involving prolonged restricted postures such as carrying heavy shoulder loads, pulling shoulders back and down, or reaching above shoulder level for prolonged periods of time can cause the inflammation (soreness) and swelling of tendons (cord like structures which attach muscle to bone) and muscles in the shoulders and upper arms. When swollen or inflamed, they can compress the nerves and blood vessels between the neck and shoulders.  SYMPTOMS   Pain.  Arm weakness.  Numbness in the arm and fingers.  Sense of touch or the  ability to feel heat and cold may be lost. DIAGNOSIS  The diagnosis is made by medical history and physical examination. Special  tests can confirm the diagnosis.  TREATMENT   A carefully planned program of exercise therapy.  Applying ice packs for 15-20 minutes, 3-4 times per day, may be useful.  Avoid work activities suspected of causing the condition.  Only take over-the-counter or prescription medicines for pain, discomfort, or fever as directed by your caregiver.  Surgery may be necessary if symptoms persist. PREVENTION   Avoid carrying heavy weights.  Avoid reaching overhead.  Avoid lifting with the arms above shoulder level. SEEK IMMEDIATE MEDICAL CARE IF:   You have an increase in swelling or pain in your arm.  You notice coldness in your fingers.  Pain relief is not obtained with medications. Document Released: 01/06/2002 Document Revised: 04/10/2011 Document Reviewed: 01/16/2005 [facility] Patient Information 2014 [place], [place].

## 2012-09-17 ENCOUNTER — Ambulatory Visit: Payer: Self-pay | Admitting: Family Medicine

## 2012-09-20 ENCOUNTER — Other Ambulatory Visit: Payer: Self-pay | Admitting: *Deleted

## 2012-09-20 MED ORDER — CARVEDILOL 12.5 MG PO TABS: 12.5000 mg | ORAL_TABLET | Freq: Two times a day (BID) | ORAL | Status: DC

## 2012-09-22 ENCOUNTER — Encounter: Payer: Self-pay | Admitting: Family Medicine

## 2012-09-22 NOTE — Assessment & Plan Note (Signed)
Form filled out for patient to get assistance with cost of Lantus.

## 2012-09-22 NOTE — Assessment & Plan Note (Signed)
Stable

## 2012-09-22 NOTE — Assessment & Plan Note (Signed)
Well controlled on current meds. No changes

## 2012-09-22 NOTE — Assessment & Plan Note (Addendum)
Struggling with right hip and arm pain. Encouraged increased activity as tolerated and [place] prn. Will refer to ortho if symptoms do not worsen

## 2012-09-22 NOTE — Progress Notes (Signed)
Patient ID: Brittney Cunningham, female   DOB: 1940-09-13, 72 y.o.   MRN: [id number] Brittney Cunningham [id number] 08-31-1940 09/22/2012      Progress Note-Follow Up  Subjective  Chief Complaint  Chief Complaint  Patient presents with  . Follow-up    3 month    HPI  Patient is a 72 year old female who is in today for followup. Her major complaint is pain. She has some intermittent trouble with right hip pain as well as intermittent trouble with right arm pain pain can occur in the right arm from the shoulder although it hand. She has a lot of trouble with stiffness and pain in neck and shoulders well. Been worsening over several months but she denies a fall or injury. No other complaints. No chest pain, palpitations, shortness of breath, GI or GU concerns.  Past Medical History  Diagnosis Date  . Anemia   . Depression   . Diabetes mellitus type II   . GERD (gastroesophageal reflux disease)   . Hyperlipidemia   . Hypothyroidism   . Osteoarthritis   . Carotid artery stenosis     bilateral  . CAD (coronary artery disease)   . OSA (obstructive sleep apnea)   . Breast cancer     right breast cancer-invasive  ductal carcinoma (StageIV)  . Hypertension   . Anxiety   . Carotid artery occlusion   . Unspecified constipation 03/15/2012  . Bursitis   . Collagen vascular disease     Past Surgical History  Procedure Laterality Date  . Incisional breast biopsy      remoted left breast biopsy  . Cesarean section    . Other surgical history      GYN surgery  . Knee surgery      right knee surgery  . Carotid endarterectomy      right carotid  . Port-a-cath removal    . Modified mastectomy      Right breast  . Breast surgery    . Mastectomy    . Carotid stent      left    Family History  Problem Relation Age of Onset  . Arthritis    . Coronary artery disease      first degree relative  . Stroke      first degree relative  . Diabetes Mother   . Heart disease Mother   .  Hypertension Mother   . Cancer Mother   . Deep vein thrombosis Mother   . Colon cancer Neg Hx   . Stomach cancer Neg Hx   . Cancer Father     History   Social History  . Marital Status: Married    Spouse Name: N/A    Number of Children: 3  . Years of Education: N/A   Occupational History  . 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 Smoking status: Former Smoker -- 1.00 packs/day for 20 years    Types: Cigarettes    Quit date: 01/31/1988  . Smokeless tobacco: Never Used  . Alcohol Use: No  . Drug Use: No  . Sexual Activity: Not on file   Other Topics Concern  . Not on file   Social History Narrative   Retired-ran a daycare facility for 40 years.   Married- 43 years    2 sons    1 daughter - Engineer, civil (consulting) midwife   Former smoker-quit in [place].     Current Outpatient Prescriptions on File Prior  to Visit  Medication Sig Dispense Refill  . aspirin 81 MG chewable tablet Chew 81 mg by mouth daily.      Marland Kitchen atorvastatin (LIPITOR) 40 MG tablet Take 1 tablet (40 mg total) by mouth daily.  90 tablet  1  . chlorhexidine (PERIDEX) 0.12 % solution Rinse for 90  seconds  with 15 mLs three times daily. Spit out excess. Do NOT swallow.      . clopidogrel (PLAVIX) 75 MG tablet Take 1 tablet (75 mg total) by mouth daily.  30 tablet  6  . cyanocobalamin (,VITAMIN B-12,) 1000 MCG/ML injection Inject 1,000 mcg into the muscle every 30 (thirty) days. Last dose 03/09/2011      . darbepoetin (ARANESP) 200 MCG/0.4ML SOLN Inject 200 mcg into the skin once. Every 2 weeks      . glycerin adult (GLYCERIN ADULT) 2 G SUPP Place 1 suppository rectally once as needed (constipation).  10 suppository  0  . hydrochlorothiazide (MICROZIDE) 12.5 MG capsule Take 1 capsule (12.5 mg total) by mouth daily.  30 capsule  6  . HYDROcodone-acetaminophen (NORCO/VICODIN) 5-325 MG per tablet Take 2 tablets by mouth every 6 (six) hours as needed for pain.  120 tablet  1  . ibuprofen (ADVIL) 200 MG tablet  Take 200 mg by mouth 3 (three) times daily.        . insulin glargine (LANTUS) 100 UNIT/ML injection Inject 60 Units into the skin at bedtime. Pt wants vials.  30 mL  3  . letrozole (FEMARA) 2.5 MG tablet Take 1 tablet (2.5 mg total) by mouth daily.  30 tablet  3  . levothyroxine (SYNTHROID, LEVOTHROID) 200 MCG tablet Take 1 tablet (200 mcg total) by mouth daily before breakfast.  30 tablet  3  . levothyroxine (SYNTHROID, LEVOTHROID) 75 MCG tablet Take 1 tablet (75 mcg total) by mouth daily.  30 tablet  3  . losartan (COZAAR) 50 MG tablet Take 1 tablet (50 mg total) by mouth daily.  30 tablet  5  . metFORMIN (GLUCOPHAGE) 500 MG tablet Take 1 tablet (500 mg total) by mouth every morning.  30 tablet  5  . oxyCODONE-acetaminophen (PERCOCET/ROXICET) 5-325 MG per tablet Take 1 tablet by mouth 4 (four) times daily as needed. For pain  30 tablet  0  . pantoprazole (PROTONIX) 40 MG tablet Take 1 tablet (40 mg total) by mouth daily.  30 tablet  3  . sennosides-docusate sodium (SENOKOT-S) 8.6-50 MG tablet 1 or 2 tabs by mouth daily      . [DISCONTINUED] simvastatin (ZOCOR) 40 MG tablet Take 40 mg by mouth every evening.       No current facility-administered medications on file prior to visit.    Allergies  Allergen Reactions  . Chlorhexidine Hives, Itching and Rash    This was most likely a CONTACT DERMATITIS versus true systemic allergic reaction  . Adhesive [Tape] Hives  . Codeine Swelling    Review of Systems  Review of Systems  Constitutional: Negative for fever and malaise/fatigue.  HENT: Negative for congestion.   Eyes: Negative for discharge.  Respiratory: Negative for shortness of breath.   Cardiovascular: Negative for chest pain, palpitations and leg swelling.  Gastrointestinal: Negative for nausea, abdominal pain and diarrhea.  Genitourinary: Negative for dysuria.  Musculoskeletal: Negative for falls.  Skin: Negative for rash.  Neurological: Negative for loss of consciousness and  headaches.  Endo/Heme/Allergies: Negative for polydipsia.  Psychiatric/Behavioral: Negative for depression and suicidal ideas. The patient is not nervous/anxious and  does not have insomnia.     Objective  BP 112/62  Pulse 80  Temp(Src) 98.6 F (37 C) (Oral)  Ht 5\' 3"  (1.6 m)  Wt 227 lb 1.3 oz (103.003 kg)  BMI 40.24 kg/m2  SpO2 93%  LMP 03/13/2012  Physical Exam  Physical Exam  Constitutional: She is oriented to person, place, and time and well-developed, well-nourished, and in no distress. No distress.  HENT:  Head: Normocephalic and atraumatic.  Eyes: Conjunctivae are normal.  Neck: Neck supple. No thyromegaly present.  Cardiovascular: Normal rate, regular rhythm and normal heart sounds.   No murmur heard. Pulmonary/Chest: Effort normal and breath sounds normal. She has no wheezes.  Abdominal: She exhibits no distension and no mass.  Musculoskeletal: She exhibits no edema.  Lymphadenopathy:    She has no cervical adenopathy.  Neurological: She is alert and oriented to person, place, and time.  Skin: Skin is warm and dry. No rash noted. She is not diaphoretic.  Psychiatric: Memory, affect and judgment normal.    Lab Results  Component Value Date   TSH 0.030* 06/26/2012   Lab Results  Component Value Date   WBC 8.2 09/16/2012   HGB 10.4* 09/16/2012   HCT 32.2* 09/16/2012   MCV 90.3 09/16/2012   PLT 236 09/16/2012   Lab Results  Component Value Date   CREATININE 1.4* 09/02/2012   BUN 32.5* 09/02/2012   NA 137 09/02/2012   K 5.1 09/02/2012   CL 104 07/08/2012   CO2 19* 09/02/2012   Lab Results  Component Value Date   ALT 9 09/02/2012   AST 21 09/02/2012   ALKPHOS 120 09/02/2012   BILITOT 0.33 09/02/2012   Lab Results  Component Value Date   CHOL 258* 06/26/2012   Lab Results  Component Value Date   HDL 29* 06/26/2012   Lab Results  Component Value Date   LDLCALC 164* 06/26/2012   Lab Results  Component Value Date   TRIG 326* 06/26/2012   Lab Results  Component Value  Date   CHOLHDL 8.9 06/26/2012     Assessment & Plan  HYPERTENSION Well controlled on current meds. No changes.  DIABETES MELLITUS, TYPE II Form filled out for patient to get assistance with cost of Lantus.   OSTEOARTHRITIS Struggling with right hip and arm pain. Encouraged increased activity as tolerated and Salon Pas prn. Will refer to ortho if symptoms do not worsen  ANEMIA Stable.

## 2012-09-23 ENCOUNTER — Other Ambulatory Visit: Payer: Self-pay | Admitting: *Deleted

## 2012-09-26 ENCOUNTER — Other Ambulatory Visit: Payer: Self-pay | Admitting: *Deleted

## 2012-09-26 DIAGNOSIS — C50911 Malignant neoplasm of unspecified site of right female breast: Secondary | ICD-10-CM

## 2012-09-26 MED ORDER — OXYCODONE-ACETAMINOPHEN 5-325 MG PO TABS: 1.0000 | ORAL_TABLET | Freq: Four times a day (QID) | ORAL | Status: DC | PRN

## 2012-10-01 ENCOUNTER — Other Ambulatory Visit: Payer: Self-pay | Admitting: Lab

## 2012-10-01 ENCOUNTER — Ambulatory Visit: Payer: Self-pay

## 2012-10-03 ENCOUNTER — Telehealth: Payer: Self-pay | Admitting: Oncology

## 2012-10-03 ENCOUNTER — Ambulatory Visit (HOSPITAL_BASED_OUTPATIENT_CLINIC_OR_DEPARTMENT_OTHER): Payer: Medicare Other | Admitting: Family

## 2012-10-03 ENCOUNTER — Telehealth: Payer: Self-pay | Admitting: *Deleted

## 2012-10-03 ENCOUNTER — Encounter: Payer: Self-pay | Admitting: Family

## 2012-10-03 ENCOUNTER — Other Ambulatory Visit: Payer: Self-pay | Admitting: Oncology

## 2012-10-03 DIAGNOSIS — IMO0001 Reserved for inherently not codable concepts without codable children: Secondary | ICD-10-CM

## 2012-10-03 DIAGNOSIS — D649 Anemia, unspecified: Secondary | ICD-10-CM

## 2012-10-03 DIAGNOSIS — C7951 Secondary malignant neoplasm of bone: Secondary | ICD-10-CM

## 2012-10-03 DIAGNOSIS — R5381 Other malaise: Secondary | ICD-10-CM

## 2012-10-03 DIAGNOSIS — M79609 Pain in unspecified limb: Secondary | ICD-10-CM

## 2012-10-03 DIAGNOSIS — K5900 Constipation, unspecified: Secondary | ICD-10-CM

## 2012-10-03 DIAGNOSIS — C50619 Malignant neoplasm of axillary tail of unspecified female breast: Secondary | ICD-10-CM

## 2012-10-03 MED ORDER — MORPHINE SULFATE 15 MG PO TABS: 15.0000 mg | ORAL_TABLET | ORAL | Status: DC | PRN

## 2012-10-03 MED ORDER — MORPHINE SULFATE ER 30 MG PO TBCR: 30.0000 mg | EXTENDED_RELEASE_TABLET | Freq: Two times a day (BID) | ORAL | Status: DC

## 2012-10-03 NOTE — Telephone Encounter (Signed)
Pt called to this RN stating she has had increased pain and " just sick " while she was on vacation and has now returned.  Pain is all over not in one area- just general but noted increased weakness in legs and 1 arm. " in my joints and muscles - just everywhere ".  Pain is worse at night.  Noted constipation which pt is treating with sennakot s and fiber supplement. She is having stools " just not like normal".  Current medication regiman is percocet 1-2 q 6 hours which for the past week Rene has been having to use consistantly with minimial benefit.  This note will be given to MD.  Per MD review pt needs to be seen for new pain evaluation for change in pain medications.

## 2012-10-03 NOTE — Patient Instructions (Addendum)
Please contact us at [phone] if you have any questions or concerns.  Get plenty of rest, drink plenty of water, exercise daily, eat a balanced diet.  Take Benadryl immediately if you experience any swelling with the new pain medications and call 911.

## 2012-10-03 NOTE — Progress Notes (Signed)
[facility]  [phone] [phone]  OFFICE PROGRESS NOTE   ID: [name]   DOB: [birth year]  MR#: [id number]  NUU#:[id number]   PCP: Danise Edge, MD SU: Emelia Loron OTHER MD: Cindra Eves, Dorothy Puffer, Caryn Bee Supple   HISTORY OF PRESENT ILLNESS: Brittney Cunningham was diagnosed with right breast carcinoma in May of 2011, a biopsy showing a grade 2 invasive ductal carcinoma, T2 NXM1, stage IV at presentation. There was bone only involvement. Tumor was strongly ER and PR positive, HER-2/neu negative, with an MIP-1 of 26%.  She was treated neoadjuvantly with letrozole and zoledronic acid beginning in June of 2011. She is status post right modified radical mastectomy in February 2012 for a ypT2 ypN2, grade 1 invasive ductal carcinoma. Margins were negative. Her subsequent history is as detailed below   INTERVAL HISTORY: Dr. Darnelle Catalan and I saw Brittney Cunningham today for an acute care visit regarding intractable pain in her right upper extremity, right lower extremity, diffuse myalgias/arthralgias, fatigue and constipation.  She is accompanied by her husband Brittney Cunningham for today's office visit.  She was last seen by her office on 05/02/2012.  Her interval history is otherwise unremarkable.     REVIEW OF SYSTEMS: Brittney Cunningham rates her pain as a 10 out of 10 and states she hurts "from head to toe."  She states she even has trouble turning over in the bed due to the pain.  She is currently taking 2 Percocet every 6 hours which she states "dulls" the pain but does not completely relieve her pain.  She states she is constipated and having hard bowel movements.  Her last bowel movement was yesterday.  We discussed bowel prophylaxis including MiraLAX daily and Senokot twice a day in addition to increased hydration.  She also has complaints of fatigue.  We discussed her receiving home physical therapy in addition to becoming more physically active (as tolerated) for her  fatigue.  She once again discussed being in the "donut hole," with regards to her medications.  Speaking to our financial counselors was once again reiterated. Otherwise a detailed review of systems today was  stable/noncontributory.     PAST MEDICAL HISTORY: Past Medical History  Diagnosis Date  . Anemia   . Depression   . Diabetes mellitus type II   . GERD (gastroesophageal reflux disease)   . Hyperlipidemia   . Hypothyroidism   . Osteoarthritis   . Carotid artery stenosis     bilateral  . CAD (coronary artery disease)   . OSA (obstructive sleep apnea)   . Breast cancer     right breast cancer-invasive  ductal carcinoma (StageIV)  . Hypertension   . Anxiety   . Carotid artery occlusion   . Unspecified constipation 03/15/2012  . Bursitis   . Collagen vascular disease     PAST SURGICAL HISTORY: Past Surgical History  Procedure Laterality Date  . Incisional breast biopsy      remoted left breast biopsy  . Cesarean section    . Other surgical history      GYN surgery  . Knee surgery      right knee surgery  . Carotid endarterectomy      right carotid  . Port-a-cath removal    . Modified mastectomy      Right breast  . Breast surgery    . Mastectomy    . Carotid stent      left    FAMILY HISTORY Family History  Problem Relation Age of  Onset  . Arthritis    . Coronary artery disease      first degree relative  . Stroke      first degree relative  . Diabetes Mother   . Heart disease Mother   . Hypertension Mother   . Cancer Mother   . Deep vein thrombosis Mother   . Colon cancer Neg Hx   . Stomach cancer Neg Hx   . Cancer Father     Pancreatic cancer  . Breast cancer Paternal Aunt 74  The patient's father died from pancreatic cancer at the age of 43. The patient's mother died from complications of diabetes and heart disease at the age of 35. The patient has 2 sisters and 2 brothers. There is no history of breast cancer or ovarian cancer in the immediate  family. One of the patient's paternal aunts out of 6 paternal aunts had breast cancer diagnosed in her 106s.    GYNECOLOGIC HISTORY: The patient is GX, P3. First pregnancy to term age 72. She went through the change of life in her late 70s. She never took hormones.   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 Her husband, Brittney Cunningham, is disabled secondary to a fall.  He has difficulty with walking. He used to work for [employer] previously. The patient's Cunningham, Brittney Cunningham, lives in [place], and works for [employer]. Daughter, Brittney Cunningham, lives in [place], and works for [employer]. Daughter, Brittney Cunningham, lives in [place], [place], and works as a Statistician. The patient attends [employer].  ADVANCED DIRECTIVES: Not on file  HEALTH MAINTENANCE: History  Substance Use Topics  . Smoking status: Former Smoker -- 1.00 packs/day for 20 years    Types: Cigarettes    Quit date: 01/31/1988  . Smokeless tobacco: Never Used  . Alcohol Use: No     Colonoscopy: Not on file  PAP: Not on file  Bone density: July 2011, Normal  Lipid panel: 06/26/2012  Allergies  Allergen Reactions  . Chlorhexidine Hives, Itching and Rash    This was most likely a CONTACT DERMATITIS versus true systemic allergic reaction  . Adhesive [Tape] Hives  . Codeine Swelling    Current Outpatient Prescriptions  Medication Sig Dispense Refill  . aspirin 81 MG chewable tablet Chew 81 mg by mouth daily.      Marland Kitchen atorvastatin (LIPITOR) 40 MG tablet Take 1 tablet (40 mg total) by mouth daily.  90 tablet  1  . carvedilol (COREG) 12.5 MG tablet Take 1 tablet (12.5 mg total) by mouth 2 (two) times daily with a meal.  60 tablet  0  . chlorhexidine (PERIDEX) 0.12 % solution Rinse for 90  seconds  with 15 mLs three times daily. Spit out excess. Do NOT swallow.      . clopidogrel (PLAVIX) 75 MG tablet Take 1 tablet (75 mg total) by mouth daily.  30 tablet  6  . cyanocobalamin  (,VITAMIN B-12,) 1000 MCG/ML injection Inject 1,000 mcg into the muscle every 30 (thirty) days. Last dose 03/09/2011      . darbepoetin (ARANESP) 200 MCG/0.4ML SOLN Inject 200 mcg into the skin once. Every 2 weeks      . glycerin adult (GLYCERIN ADULT) 2 G SUPP Place 1 suppository rectally once as needed (constipation).  10 suppository  0  . hydrochlorothiazide (MICROZIDE) 12.5 MG capsule Take 1 capsule (12.5 mg total) by mouth daily.  30 capsule  6  . ibuprofen (ADVIL) 200 MG tablet Take  200 mg by mouth 3 (three) times daily.        . insulin glargine (LANTUS) 100 UNIT/ML injection Inject 60 Units into the skin at bedtime. Pt wants vials.  30 mL  3  . letrozole (FEMARA) 2.5 MG tablet Take 1 tablet (2.5 mg total) by mouth daily.  30 tablet  3  . levothyroxine (SYNTHROID, LEVOTHROID) 200 MCG tablet Take 1 tablet (200 mcg total) by mouth daily before breakfast.  30 tablet  3  . levothyroxine (SYNTHROID, LEVOTHROID) 75 MCG tablet Take 1 tablet (75 mcg total) by mouth daily.  30 tablet  3  . losartan (COZAAR) 50 MG tablet Take 1 tablet (50 mg total) by mouth daily.  30 tablet  5  . metFORMIN (GLUCOPHAGE) 500 MG tablet Take 1 tablet (500 mg total) by mouth every morning.  30 tablet  5  . sennosides-docusate sodium (SENOKOT-S) 8.6-50 MG tablet 1 or 2 tabs by mouth daily      . morphine (MS CONTIN) 30 MG 12 hr tablet Take 1 tablet (30 mg total) by mouth 2 (two) times daily.  60 tablet  0  . morphine (MSIR) 15 MG tablet Take 1 tablet (15 mg total) by mouth every 4 (four) hours as needed for pain.  60 tablet  0  . [DISCONTINUED] simvastatin (ZOCOR) 40 MG tablet Take 40 mg by mouth every evening.       No current facility-administered medications for this visit.       Elderly white woman who appears fatigued and in moderate distress secondary to pain: Filed Vitals:   10/03/12 1327  BP: 148/78  Pulse: 83  Temp: 98.6 F (37 C)  Resp: 20     Body mass index is 39.19 kg/(m^2).    ECOG FS: 2  Filed  Weights   10/03/12 1327  Weight: 221 lb 3.2 oz (100.336 kg)    General appearance: Alert, cooperative, well nourished, moderate distress/tearful at times secondary to pain, sitting in a wheelchair Head: Normocephalic, without obvious abnormality, atraumatic Eyes: Arcus senilis, PERRLA, EOMI Nose: Nares, septum and mucosa are normal, no drainage or sinus tenderness Neck: Supple, symmetrical, trachea midline, no tenderness Resp: Clear to auscultation bilaterally Cardio: Regular rate and rhythm, S1, S2 normal, no murmur, click, rub or gallop, distant heart sounds Breasts: Deferred, visible left breast mastectomy GI: Soft, distended, non-tender, normoactive bowel sounds, excessive habitus Extremities: Extremities normal, atraumatic, no cyanosis or edema, limited range of motion in extremities R >L Lymph nodes: Supraclavicular nodes normal Neurologic: Cranial nerves II through XII intact, grossly normal   LAB RESULTS: Lab Results  Component Value Date   WBC 8.2 09/16/2012   NEUTROABS 4.7 09/16/2012   HGB 10.4* 09/16/2012   HCT 32.2* 09/16/2012   MCV 90.3 09/16/2012   PLT 236 09/16/2012      Chemistry      Component Value Date/Time   NA 137 09/02/2012 1017   NA 135 04/13/2012 1909   K 5.1 09/02/2012 1017   K 4.5 04/13/2012 1909   CL 104 07/08/2012 1023   CL 99 04/13/2012 1909   CO2 19* 09/02/2012 1017   CO2 24 04/13/2012 1909   BUN 32.5* 09/02/2012 1017   BUN 29* 04/13/2012 1909   CREATININE 1.4* 09/02/2012 1017   CREATININE 0.90 04/13/2012 1909      Component Value Date/Time   CALCIUM 9.4 09/02/2012 1017   CALCIUM 9.5 04/13/2012 1909   ALKPHOS 120 09/02/2012 1017   ALKPHOS 126* 06/26/2012 1033   AST  21 09/02/2012 1017   AST 14 06/26/2012 1033   ALT 9 09/02/2012 1017   ALT <8 06/26/2012 1033   BILITOT 0.33 09/02/2012 1017   BILITOT 0.3 06/26/2012 1033      Lab Results  Component Value Date   LABCA2 88* 09/28/2011    STUDIES: No results found.   ASSESSMENT: 72 y.o. [place] woman:   (1)  Status post right breast biopsy in 05/2009 for a grade 2 invasive ductal carcinoma, T2 NX M1, Stage IV, with bone-only involvement, strongly estrogen and progesterone receptor-positive, HER-2-negative with an MIB-1 of 26%.   (2) Neoadjuvantly she received Letrozole and zoledronic acid beginning in June of 2011 and underwent right modified radical mastectomy February of 2012 for a ypT2 ypN2, grade 1 invasive ductal carcinoma with negative margins.   (3) She completed radiation therapy in August of 2012.   (4) She has continued on Letrozole but was switched from Zoledronic acid to Denosumab Rivka Barbara) because of concerns regarding her serum creatinine. Rivka Barbara was being given every 8 weeks.  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  (6) symptomatic anemia, with creatinine clearance < 60 cc/min; Darbepoietin Q14d started 11/03/2011; received Feraheme 01/05/2012  (7) On subcutaneous B-12 supplementation monthly.  (8) Pain in left upper extremity with osseous metastasis, osteoarthritis, tendinopathy, and bursitis as confirmed by recent MRI.  (9) Anemia, multifactorial, on Aranesp every 2 weeks.    PLAN: Pain medication regimen has been changed to: (a) MS Contin 30 mg by mouth Q12 hours.   (b) MSIR 15 mg take 1 tablet by mouth every 4 hours/ when necessary for pain. The patient received written prescriptions for both medications today #60 with 0 refills. Mrs. Vermette was asked to contact 911 immediately and take 2 Benadryl if she experienced any facial swelling while on the new pain medications.  Home physical therapy has been requested due to increasing pain/deconditioning/fatigue.  Bowel prophylaxis including MiraLAX daily and Senokot twice a day was discussed and recommended to the patient.  The patient last received Aranesp and B12 injections on 09/16/2012.  A PET scan has been ordered to be completed within the next 4 days.  We plan on seeing Mrs.  Fleer again on 10/08/2012 at which time we'll review her PET scan results, check laboratories of CBC and CMP, and assess her pain with new pain medication regimen.  Larina Bras, NP-C 10/04/2012  2:21 PM     ADDENDUM: I am not sure why Tamy's pain has increased. She had been stable on the same medication for a long time. We are of course going to bring the pain under control by switching her to long-acting morphine with short-acting morphine for breakthrough pain. She was also advised to update her bowel prophylaxis and she will be on stool softeners and MiraLAX daily.  We are setting her up for a PET scan just to make sure there has been no significant developments. She will see me shortly after the test to discuss results.  I personally saw this patient and performed a substantive portion of this encounter with the listed APP documented above.   Ruthann Cancer, MD

## 2012-10-04 ENCOUNTER — Encounter: Payer: Self-pay | Admitting: Family

## 2012-10-07 ENCOUNTER — Telehealth: Payer: Self-pay | Admitting: Oncology

## 2012-10-08 ENCOUNTER — Ambulatory Visit: Payer: Self-pay | Admitting: Oncology

## 2012-10-15 ENCOUNTER — Encounter (HOSPITAL_COMMUNITY)
Admission: RE | Admit: 2012-10-15 | Discharge: 2012-10-15 | Disposition: A | Discharge status: Home / Self Care | Payer: Medicare Other | Source: Ambulatory Visit | Attending: Family | Admitting: Family

## 2012-10-15 ENCOUNTER — Ambulatory Visit (HOSPITAL_BASED_OUTPATIENT_CLINIC_OR_DEPARTMENT_OTHER): Payer: Medicare Other

## 2012-10-15 ENCOUNTER — Other Ambulatory Visit (HOSPITAL_BASED_OUTPATIENT_CLINIC_OR_DEPARTMENT_OTHER): Payer: Medicare Other | Admitting: Lab

## 2012-10-15 VITALS — BP 146/56 | HR 78 | Temp 98.6°F

## 2012-10-15 DIAGNOSIS — K219 Gastro-esophageal reflux disease without esophagitis: Secondary | ICD-10-CM

## 2012-10-15 DIAGNOSIS — D649 Anemia, unspecified: Secondary | ICD-10-CM

## 2012-10-15 DIAGNOSIS — C50911 Malignant neoplasm of unspecified site of right female breast: Secondary | ICD-10-CM

## 2012-10-15 DIAGNOSIS — C50919 Malignant neoplasm of unspecified site of unspecified female breast: Secondary | ICD-10-CM | POA: Insufficient documentation

## 2012-10-15 DIAGNOSIS — C7951 Secondary malignant neoplasm of bone: Secondary | ICD-10-CM | POA: Insufficient documentation

## 2012-10-15 DIAGNOSIS — E538 Deficiency of other specified B group vitamins: Secondary | ICD-10-CM

## 2012-10-15 LAB — COMPREHENSIVE METABOLIC PANEL (CC13)
CO2: 24 mEq/L (ref 22–29)
Calcium: 9.3 mg/dL (ref 8.4–10.4)
Creatinine: 1.1 mg/dL (ref 0.6–1.1)
Glucose: 162 mg/dl — ABNORMAL HIGH (ref 70–140)
Total Bilirubin: 0.27 mg/dL (ref 0.20–1.20)
Total Protein: 7 g/dL (ref 6.4–8.3)

## 2012-10-15 LAB — CBC WITH DIFFERENTIAL/PLATELET
Eosinophils Absolute: 0.3 10*3/uL (ref 0.0–0.5)
HCT: 30.3 % — ABNORMAL LOW (ref 34.8–46.6)
HGB: 9.7 g/dL — ABNORMAL LOW (ref 11.6–15.9)
LYMPH%: 30.2 % (ref 14.0–49.7)
MONO#: 0.6 10*3/uL (ref 0.1–0.9)
NEUT#: 4.2 10*3/uL (ref 1.5–6.5)
NEUT%: 57 % (ref 38.4–76.8)
Platelets: 331 10*3/uL (ref 145–400)
WBC: 7.4 10*3/uL (ref 3.9–10.3)

## 2012-10-15 MED ORDER — CYANOCOBALAMIN 1000 MCG/ML IJ SOLN
1000.0000 ug | Freq: Once | INTRAMUSCULAR | Status: AC
  Administered 2012-10-15: 1000 ug via INTRAMUSCULAR

## 2012-10-15 MED ORDER — FLUDEOXYGLUCOSE F - 18 (FDG) INJECTION
18.9000 | Freq: Once | INTRAVENOUS | Status: AC | PRN
  Administered 2012-10-15: 18.9 via INTRAVENOUS

## 2012-10-15 MED ORDER — DARBEPOETIN ALFA-POLYSORBATE 200 MCG/0.4ML IJ SOLN
200.0000 ug | Freq: Once | INTRAMUSCULAR | Status: AC
  Administered 2012-10-15: 200 ug via SUBCUTANEOUS
  Filled 2012-10-15: qty 0.4

## 2012-10-16 ENCOUNTER — Other Ambulatory Visit: Payer: Self-pay | Admitting: *Deleted

## 2012-10-16 NOTE — Telephone Encounter (Signed)
Refill request for letrozole 2.5 mg to collaborative nurse desk for appointment with MD tomorrow.

## 2012-10-17 ENCOUNTER — Telehealth: Payer: Self-pay | Admitting: Oncology

## 2012-10-17 ENCOUNTER — Ambulatory Visit (HOSPITAL_BASED_OUTPATIENT_CLINIC_OR_DEPARTMENT_OTHER): Payer: Medicare Other

## 2012-10-17 ENCOUNTER — Ambulatory Visit (HOSPITAL_BASED_OUTPATIENT_CLINIC_OR_DEPARTMENT_OTHER): Payer: Medicare Other | Admitting: Oncology

## 2012-10-17 VITALS — BP 133/69 | HR 73 | Temp 98.6°F | Resp 17 | Ht 63.0 in | Wt 222.2 lb

## 2012-10-17 DIAGNOSIS — C7951 Secondary malignant neoplasm of bone: Secondary | ICD-10-CM

## 2012-10-17 DIAGNOSIS — D649 Anemia, unspecified: Secondary | ICD-10-CM

## 2012-10-17 DIAGNOSIS — M25519 Pain in unspecified shoulder: Secondary | ICD-10-CM

## 2012-10-17 DIAGNOSIS — Z5111 Encounter for antineoplastic chemotherapy: Secondary | ICD-10-CM

## 2012-10-17 DIAGNOSIS — C50619 Malignant neoplasm of axillary tail of unspecified female breast: Secondary | ICD-10-CM

## 2012-10-17 DIAGNOSIS — C50919 Malignant neoplasm of unspecified site of unspecified female breast: Secondary | ICD-10-CM

## 2012-10-17 DIAGNOSIS — E119 Type 2 diabetes mellitus without complications: Secondary | ICD-10-CM

## 2012-10-17 DIAGNOSIS — R5381 Other malaise: Secondary | ICD-10-CM

## 2012-10-17 MED ORDER — FULVESTRANT 250 MG/5ML IM SOLN
500.0000 mg | Freq: Once | INTRAMUSCULAR | Status: AC
  Administered 2012-10-17: 500 mg via INTRAMUSCULAR
  Filled 2012-10-17: qty 10

## 2012-10-17 NOTE — Progress Notes (Signed)
Patient ID: Brittney Cunningham, female   DOB: 1940/07/02, 72 y.o.   MRN: [id number]  [facility]  [phone] [phone]  OFFICE PROGRESS NOTE   ID: Brittney Cunningham   DOB: September 03, 1940  MR#: [id number]  ONG#:[id number]   PCP: Danise Edge, MD SU: Emelia Loron OTHER MD: Cindra Eves, Dorothy Puffer, Caryn Bee Supple   HISTORY OF PRESENT ILLNESS: Brittney Cunningham was diagnosed with right breast carcinoma in May of 2011, a biopsy showing a grade 2 invasive ductal carcinoma, T2 NXM1, stage IV at presentation. There was bone only involvement. Tumor was strongly ER and PR positive, HER-2/neu negative, with an MIP-1 of 26%.  She was treated neoadjuvantly with letrozole and zoledronic acid beginning in June of 2011. She is status post right modified radical mastectomy in February 2012 for a ypT2 ypN2, grade 1 invasive ductal carcinoma. Margins were negative. Her subsequent history is as detailed below   INTERVAL HISTORY: Brittney Cunningham returns today for followup of her metastatic breast cancer. At the last visit we changed her pain medication, adding MS Contin 30 mg to be taken twice daily, with 15 mg of quick acting morphine as needed for breakthrough pain. This is working well for her, and her pain is considerably better. She is mildly constipated, and she takes MiraLAX on a daily basis together with stool softeners for this, with good results. Since her last visit here we also set her up for a PET scan, which thankfully he does not show involvement of the liver or lungs.  REVIEW OF SYSTEMS: Overall the pain on is better she continues to have significant discomfort in the right upper arm. The PET scan does show a lesion there. This is clearly separate from the muscle aches that she gets intermittently. She continues to describe herself as moderate fatigued. She continues to work at controlling her diabetes. Overall though if anything she feels stronger than last visit. A detailed review of  systems today was otherwise noncontributory  PAST MEDICAL HISTORY: Past Medical History  Diagnosis Date  . Anemia   . Depression   . Diabetes mellitus type II   . GERD (gastroesophageal reflux disease)   . Hyperlipidemia   . Hypothyroidism   . Osteoarthritis   . Carotid artery stenosis     bilateral  . CAD (coronary artery disease)   . OSA (obstructive sleep apnea)   . Breast cancer     right breast cancer-invasive  ductal carcinoma (StageIV)  . Hypertension   . Anxiety   . Carotid artery occlusion   . Unspecified constipation 03/15/2012  . Bursitis   . Collagen vascular disease     PAST SURGICAL HISTORY: Past Surgical History  Procedure Laterality Date  . Incisional breast biopsy      remoted left breast biopsy  . Cesarean section    . Other surgical history      GYN surgery  . Knee surgery      right knee surgery  . Carotid endarterectomy      right carotid  . Port-a-cath removal    . Modified mastectomy      Right breast  . Breast surgery    . Mastectomy    . Carotid stent      left    FAMILY HISTORY Family History  Problem Relation Age of Onset  . Arthritis    . Coronary artery disease      first degree relative  . Stroke      first degree relative  .  Diabetes Mother   . Heart disease Mother   . Hypertension Mother   . Cancer Mother   . Deep vein thrombosis Mother   . Colon cancer Neg Hx   . Stomach cancer Neg Hx   . Cancer Father     Pancreatic cancer  . Breast cancer Paternal Aunt 19  The patient's father died from pancreatic cancer at the age of 2. The patient's mother died from complications of diabetes and heart disease at the age of 48. The patient has 2 sisters and 2 brothers. There is no history of breast cancer or ovarian cancer in the immediate family. One of the patient's paternal aunts out of 6 paternal aunts had breast cancer diagnosed in her 56s.    GYNECOLOGIC HISTORY: The patient is GX, P3. First pregnancy to term age 66. She  went through the change of life in her late 64s. She never took hormones.   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 Her husband, Brittney Cunningham, is disabled secondary to a fall.  He has difficulty with walking. He used to work for [employer] previously. The patient's Cunningham, Brittney Cunningham, lives in [place], and works for [employer]. Daughter, Brittney Cunningham, lives in [place], and works for [employer]. Daughter, Brittney Cunningham, lives in [place], [place], and works as a Statistician. The patient attends [employer].  ADVANCED DIRECTIVES: Not on file  HEALTH MAINTENANCE: History  Substance Use Topics  . Smoking status: Former Smoker -- 1.00 packs/day for 20 years    Types: Cigarettes    Quit date: 01/31/1988  . Smokeless tobacco: Never Used  . Alcohol Use: No     Colonoscopy: Not on file  PAP: Not on file  Bone density: July 2011, Normal  Lipid panel: 06/26/2012  Allergies  Allergen Reactions  . Chlorhexidine Hives, Itching and Rash    This was most likely a CONTACT DERMATITIS versus true systemic allergic reaction  . Adhesive [Tape] Hives  . Codeine Swelling    Current Outpatient Prescriptions  Medication Sig Dispense Refill  . aspirin 81 MG chewable tablet Chew 81 mg by mouth daily.      Marland Kitchen atorvastatin (LIPITOR) 40 MG tablet Take 1 tablet (40 mg total) by mouth daily.  90 tablet  1  . carvedilol (COREG) 12.5 MG tablet Take 1 tablet (12.5 mg total) by mouth 2 (two) times daily with a meal.  60 tablet  0  . chlorhexidine (PERIDEX) 0.12 % solution Rinse for 90  seconds  with 15 mLs three times daily. Spit out excess. Do NOT swallow.      . clopidogrel (PLAVIX) 75 MG tablet Take 1 tablet (75 mg total) by mouth daily.  30 tablet  6  . cyanocobalamin (,VITAMIN B-12,) 1000 MCG/ML injection Inject 1,000 mcg into the muscle every 30 (thirty) days. Last dose 03/09/2011      . darbepoetin (ARANESP) 200 MCG/0.4ML SOLN Inject 200 mcg into the skin once.  Every 2 weeks      . glycerin adult (GLYCERIN ADULT) 2 G SUPP Place 1 suppository rectally once as needed (constipation).  10 suppository  0  . hydrochlorothiazide (MICROZIDE) 12.5 MG capsule Take 1 capsule (12.5 mg total) by mouth daily.  30 capsule  6  . ibuprofen (ADVIL) 200 MG tablet Take 200 mg by mouth 3 (three) times daily.        . insulin glargine (LANTUS) 100 UNIT/ML injection Inject 60 Units into the skin at bedtime. Pt wants  vials.  30 mL  3  . levothyroxine (SYNTHROID, LEVOTHROID) 200 MCG tablet Take 1 tablet (200 mcg total) by mouth daily before breakfast.  30 tablet  3  . levothyroxine (SYNTHROID, LEVOTHROID) 75 MCG tablet Take 1 tablet (75 mcg total) by mouth daily.  30 tablet  3  . losartan (COZAAR) 50 MG tablet Take 1 tablet (50 mg total) by mouth daily.  30 tablet  5  . metFORMIN (GLUCOPHAGE) 500 MG tablet Take 1 tablet (500 mg total) by mouth every morning.  30 tablet  5  . morphine (MS CONTIN) 30 MG 12 hr tablet Take 1 tablet (30 mg total) by mouth 2 (two) times daily.  60 tablet  0  . morphine (MSIR) 15 MG tablet Take 1 tablet (15 mg total) by mouth every 4 (four) hours as needed for pain.  60 tablet  0  . oxyCODONE-acetaminophen (PERCOCET/ROXICET) 5-325 MG per tablet       . pantoprazole (PROTONIX) 40 MG tablet       . sennosides-docusate sodium (SENOKOT-S) 8.6-50 MG tablet 1 or 2 tabs by mouth daily      . [DISCONTINUED] simvastatin (ZOCOR) 40 MG tablet Take 40 mg by mouth every evening.       No current facility-administered medications for this visit.    OBJECTIVE: Elderly white woman in minimal distress Filed Vitals:   10/17/12 0903  BP: 133/69  Pulse: 73  Temp: 98.6 F (37 Cunningham)  Resp: 17     Body mass index is 39.37 kg/(m^2).    ECOG FS: 2  Filed Weights   10/17/12 0903  Weight: 100.789 kg (222 lb 3.2 oz)   Sclerae unicteric Oropharynx clear No cervical or supraclavicular adenopathy Lungs no rales or rhonchi Heart regular rate and rhythm Abd obese,  benign MSK no focal spinal tenderness, mild focal tenderness in the right humerus area two thirds of the way up ; no edema Neuro: nonfocal, well oriented, positive affect Breasts: Deferred     LAB RESULTS: Lab Results  Component Value Date   WBC 7.4 10/15/2012   NEUTROABS 4.2 10/15/2012   HGB 9.7* 10/15/2012   HCT 30.3* 10/15/2012   MCV 89.5 10/15/2012   PLT 331 10/15/2012      Chemistry      Component Value Date/Time   NA 136 10/15/2012 1059   NA 135 04/13/2012 1909   K 4.9 10/15/2012 1059   K 4.5 04/13/2012 1909   CL 104 07/08/2012 1023   CL 99 04/13/2012 1909   CO2 24 10/15/2012 1059   CO2 24 04/13/2012 1909   BUN 26.0 10/15/2012 1059   BUN 29* 04/13/2012 1909   CREATININE 1.1 10/15/2012 1059   CREATININE 0.90 04/13/2012 1909      Component Value Date/Time   CALCIUM 9.3 10/15/2012 1059   CALCIUM 9.5 04/13/2012 1909   ALKPHOS 179* 10/15/2012 1059   ALKPHOS 126* 06/26/2012 1033   AST 14 10/15/2012 1059   AST 14 06/26/2012 1033   ALT 10 10/15/2012 1059   ALT <8 06/26/2012 1033   BILITOT 0.27 10/15/2012 1059   BILITOT 0.3 06/26/2012 1033      Lab Results  Component Value Date   LABCA2 88* 09/28/2011    STUDIES: Nm Pet Image Restag (ps) Skull Base To Thigh  10/15/2012   CLINICAL DATA:  Subsequent treatment strategy for breast cancer. Skeletal metastases.  EXAM: NUCLEAR MEDICINE PET SKULL BASE TO THIGH  FASTING BLOOD GLUCOSE:  Value:  191 mg/dl  TECHNIQUE: 18.9  mCi F-18 FDG was injected intravenously. CT data was obtained and used for attenuation correction and anatomic localization only. (This was not acquired as a diagnostic CT examination.) Additional exam technical data entered on technologist worksheet.  COMPARISON:  Bone scan 11/16/2010, CT scan 04/13/2012  FINDINGS: NECK  No hypermetabolic lymph nodes in the neck.  CHEST  No hypermetabolic mediastinal or hilar nodes. No suspicious pulmonary nodules on the CT scan.  ABDOMEN/PELVIS  No abnormal hypermetabolic activity within the liver,  pancreas, adrenal glands, or spleen. No hypermetabolic lymph nodes in the abdomen or pelvis.  SKELETON  There is widespread diffuse hypermetabolic skeletal metastasis. CT portion exam demonstrates extensive lytic and sclerotic lesions through the axillary and appendicular skeleton. The sclerotic lesions on the CT portion are similar to comparison CT. Exemplary lesions include lesion within the proximal left humerus within SUV max 8.3. Right humerus with SUV max = 6.6 . Left iliac bone with SUV max = 6.3 There is metastatic disease within both the left and right proximal femurs.  IMPRESSION: 1. 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  2. No evidence of metastatic disease to the soft tissues of the neck, chest, abdomen, or pelvis.   Electronically Signed   By: Genevive Bi M.D.   On: 10/15/2012 12:29     ASSESSMENT: 72 y.o. [place] woman:   (1) Status post right breast biopsy in 05/2009 for a grade 2 invasive ductal carcinoma, T2 NX M1, Stage IV, with bone-only involvement, strongly estrogen and progesterone receptor-positive, HER-2-negative with an MIB-1 of 26%.   (2) Neoadjuvantly she received Letrozole and zoledronic acid beginning in June of 2011 and underwent right modified radical mastectomy February of 2012 for a ypT2 ypN2, grade 1 invasive ductal carcinoma with negative margins.   (3) She completed radiation therapy in August of 2012.   (4) She has continued on Letrozole but was switched from Zoledronic acid to Denosumab Rivka Barbara) because of concerns regarding her serum creatinine. Rivka Barbara was being given every 8 weeks.  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  (6) symptomatic anemia, with creatinine clearance < 60 cc/min; Darbepoietin Q14d started 11/03/2011; received Feraheme 01/05/2012  (7) On subcutaneous B-12 supplementation monthly.  (8) Pain in left upper extremity with osseous metastasis,  osteoarthritis, tendinopathy, and bursitis as confirmed by recent MRI.  (9) Anemia, multifactorial, on Aranesp every 2 weeks.    PLAN: The good news of course is that we don't have disease in her vital organs. With bone only disease it is always difficult to establish progression, but I think since she had more pain and continues to have increased pain in the right humerus I think is fair to say that the cancer has progressed clinically through her anti-estrogen and it is time to switch her from letrozole to a different agent.  We discussed the options and after much discussion we decided we would start Faslodex. She will receive a dose today, then  October 1, and then October 14. After that she will be treated every 4 weeks. We will follow her with lab work and physical exam as before, restaging her when there is clinical suspicion of progression.  In the meantime I think it would be useful for her to receive some radiation to the right humerus area and I am referring her back to Dr. Mitzi Hansen with that in mind.  He is a very good understanding of the overall plan. She knows to call for any problems that  may develop before her next visit here.  Brittney Fehnel C,MD  10/17/2012  12:57 PM     ADDENDUM: I am not sure why Kamile's pain has increased. She had been stable on the same medication for a long time. We are of course going to bring the pain under control by switching her to long-acting morphine with short-acting morphine for breakthrough pain. She was also advised to update her bowel prophylaxis and she will be on stool softeners and MiraLAX daily.  We are setting her up for a PET scan just to make sure there has been no significant developments. She will see me shortly after the test to discuss results.  I personally saw this patient and performed a substantive portion of this encounter with the listed APP documented above.   Ruthann Cancer, MD

## 2012-10-17 NOTE — Patient Instructions (Addendum)
Fulvestrant injection What is this medicine? FULVESTRANT (ful VES trant) blocks the effects of estrogen. It is used to treat breast cancer in women past the age of menopause. This medicine may be used for other purposes; ask your health care provider or pharmacist if you have questions. What should I tell my health care provider before I take this medicine? They need to know if you have any of these conditions: -bleeding problems -liver disease -low levels of platelets in the blood -an unusual or allergic reaction to fulvestrant, other medicines, foods, dyes, or preservatives -pregnant or trying to get pregnant -breast-feeding How should I use this medicine? This medicine is for injection into a muscle. It is usually given by a health care professional in a hospital or clinic setting. Talk to your pediatrician regarding the use of this medicine in children. Special care may be needed. Overdosage: If you think you have taken too much of this medicine contact a poison control center or emergency room at once. NOTE: This medicine is only for you. Do not share this medicine with others. What if I miss a dose? It is important not to miss your dose. Call your doctor or health care professional if you are unable to keep an appointment. What may interact with this medicine? -medicines that treat or prevent blood clots like warfarin, enoxaparin, and dalteparin This list may not describe all possible interactions. Give your health care provider a list of all the medicines, herbs, non-prescription drugs, or dietary supplements you use. Also tell them if you smoke, drink alcohol, or use illegal drugs. Some items may interact with your medicine. What should I watch for while using this medicine? Your condition will be monitored carefully while you are receiving this medicine. You will need important blood work done while you are taking this medicine. Do not become pregnant while taking this medicine.  Women should inform their doctor if they wish to become pregnant or think they might be pregnant. There is a potential for serious side effects to an unborn child. Talk to your health care professional or pharmacist for more information. What side effects may I notice from receiving this medicine? Side effects that you should report to your doctor or health care professional as soon as possible: -allergic reactions like skin rash, itching or hives, swelling of the face, lips, or tongue -feeling faint or lightheaded, falls -fever or flu-like symptoms -sore throat -vaginal bleeding Side effects that usually do not require medical attention (report to your doctor or health care professional if they continue or are bothersome): -aches, pains -constipation or diarrhea -headache -hot flashes -nausea, vomiting -pain at site where injected -stomach pain This list may not describe all possible side effects. Call your doctor for medical advice about side effects. You may report side effects to FDA at 1-800-FDA-1088. Where should I keep my medicine? This drug is given in a hospital or clinic and will not be stored at home. NOTE: This sheet is a summary. It may not cover all possible information. If you have questions about this medicine, talk to your doctor, pharmacist, or health care provider.  2013, [employer]. (05/27/2007 3:39:24 PM)  

## 2012-10-17 NOTE — Telephone Encounter (Signed)
, °

## 2012-10-18 NOTE — Progress Notes (Signed)
Histology and Location of Primary Cancer: breast  right   Sites of Visceral and Bony Metastatic Disease:widespread hypermetabolic skeletal metastasis to the axillary and appendicular skeleton Location(s) of Symptomatic Metastases:axillary and appendicular skeleton wide spread mets  Past/Anticipated chemotherapy by medical oncology, if any: She was treated neoadjuvantly with letrozole and zoledronic acid beginning in June of 2011. She is status post right modified radical mastectomy in February 2012 for a ypT2 ypN2, grade 1 invasive ductal carcinoma. Margins were negative.She has continued on Letrozole but was switched from Zoledronic acid to Denosumab Rivka Barbara) because of concerns regarding her serum creatinine. Rivka Barbara was being given every 8 weeks.  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  (6) symptomatic anemia, with creatinine clearance < 60 cc/min; Darbepoietin Q14d started 11/03/2011; received Feraheme 01/05/2012  (7) On subcutaneous B-12 supplementation monthly. To start Faslodex dose given 10/17/12 then again 10/30/12,11/12/12,themn will be treated every 4 weeks     Pain on a scale of 0-10 MW:NUUVO and left shoulders 5 on 1-10 scale,    If Spine Met(s), symptoms, if any, include:  Bowel/Bladder retention or incontinence problems with constipation, has diarrhea now, not taking imodium as yet,   Numbness or weakness in extremities (please describe):walks with a cane, knees are bad Current Decadron regimen, if applicable:no Ambulatory status? Walker? Wheelchair?cane,  SAFETY ISSUES:  Prior radiation? Yes  , 08/08/2010-09/21/2010 right chest wall and supraclavicular total dose 60.4Gy  Pacemaker/ICD? no  Possible current pregnancy? no  Is the patient on methotrexate? no  Current Complaints / other details:  Married, 3 children, faslodex 500 mg IM 10/17/12 1st injection

## 2012-10-21 ENCOUNTER — Encounter: Payer: Self-pay | Admitting: Radiation Oncology

## 2012-10-21 ENCOUNTER — Ambulatory Visit
Admission: RE | Admit: 2012-10-21 | Discharge: 2012-10-21 | Disposition: A | Discharge status: Home / Self Care | Payer: Medicare Other | Source: Ambulatory Visit | Attending: Radiation Oncology | Admitting: Radiation Oncology

## 2012-10-21 VITALS — BP 149/63 | HR 60 | Temp 98.2°F | Resp 20 | Ht 62.5 in | Wt 224.9 lb

## 2012-10-21 DIAGNOSIS — C50919 Malignant neoplasm of unspecified site of unspecified female breast: Secondary | ICD-10-CM

## 2012-10-21 DIAGNOSIS — C7951 Secondary malignant neoplasm of bone: Secondary | ICD-10-CM | POA: Insufficient documentation

## 2012-10-21 NOTE — Progress Notes (Signed)
Radiation Oncology         [phone] ________________________________  Name: Brittney Cunningham MRN: [id number]  Date: 10/21/2012  DOB: Oct 07, 1940  Re-evaluation Note  CC: Danise Edge, MD  Magrinat, Valentino Hue, MD  Diagnosis:   Metastatic breast cancer with bone metastasis  Interval Since Last Radiation:  The patient completed postmastectomy radiotherapy in 2012   Narrative:  The patient returns today for routine follow-up.  The patient is seen today for reevaluation. She has proceeded with systemic treatment through Dr. Darnelle Catalan and she has had some evidence of progression symptomatically. She is complaining of some ongoing pain in the right upper extremities/shoulders, greater on the right. The pain is centered on the right in the mid humerus region it does extend to the elbow and more proximally to the shoulder. Similar but less her pain on the left. I have personally reviewed the patient's PET scan and she does have evidence of disease in these areas.                              ALLERGIES:  is allergic to chlorhexidine; adhesive; and codeine.  Meds: Current Outpatient Prescriptions  Medication Sig Dispense Refill  . aspirin 81 MG chewable tablet Chew 81 mg by mouth daily.      Marland Kitchen atorvastatin (LIPITOR) 40 MG tablet Take 1 tablet (40 mg total) by mouth daily.  90 tablet  1  . carvedilol (COREG) 12.5 MG tablet Take 1 tablet (12.5 mg total) by mouth 2 (two) times daily with a meal.  60 tablet  0  . chlorhexidine (PERIDEX) 0.12 % solution Rinse for 90  seconds  with 15 mLs three times daily. Spit out excess. Do NOT swallow.      . clopidogrel (PLAVIX) 75 MG tablet Take 1 tablet (75 mg total) by mouth daily.  30 tablet  6  . cyanocobalamin (,VITAMIN B-12,) 1000 MCG/ML injection Inject 1,000 mcg into the muscle every 30 (thirty) days. Last dose 03/09/2011      . darbepoetin (ARANESP) 200 MCG/0.4ML SOLN Inject 200 mcg into the skin once. Every 2 weeks      . fulvestrant (FASLODEX) 250 MG/5ML  injection Inject 500 mg into the muscle every 30 (thirty) days. One injection each buttock over 1-2 minutes. Warm prior to use.      . hydrochlorothiazide (MICROZIDE) 12.5 MG capsule Take 1 capsule (12.5 mg total) by mouth daily.  30 capsule  6  . ibuprofen (ADVIL) 200 MG tablet Take 200 mg by mouth 3 (three) times daily.        . insulin glargine (LANTUS) 100 UNIT/ML injection Inject 60 Units into the skin at bedtime. Pt wants vials.  30 mL  3  . levothyroxine (SYNTHROID, LEVOTHROID) 200 MCG tablet Take 1 tablet (200 mcg total) by mouth daily before breakfast.  30 tablet  3  . levothyroxine (SYNTHROID, LEVOTHROID) 75 MCG tablet Take 1 tablet (75 mcg total) by mouth daily.  30 tablet  3  . losartan (COZAAR) 50 MG tablet Take 1 tablet (50 mg total) by mouth daily.  30 tablet  5  . metFORMIN (GLUCOPHAGE) 500 MG tablet Take 1 tablet (500 mg total) by mouth every morning.  30 tablet  5  . morphine (MS CONTIN) 30 MG 12 hr tablet Take 1 tablet (30 mg total) by mouth 2 (two) times daily.  60 tablet  0  . morphine (MSIR) 15 MG tablet Take 1 tablet (  15 mg total) by mouth every 4 (four) hours as needed for pain.  60 tablet  0  . sennosides-docusate sodium (SENOKOT-S) 8.6-50 MG tablet 1 or 2 tabs by mouth daily      . glycerin adult (GLYCERIN ADULT) 2 G SUPP Place 1 suppository rectally once as needed (constipation).  10 suppository  0  . [DISCONTINUED] simvastatin (ZOCOR) 40 MG tablet Take 40 mg by mouth every evening.       No current facility-administered medications for this encounter.    Physical Findings: The patient is in no acute distress. Patient is alert and oriented.  height is 5' 2.5" (1.588 m) and weight is 224 lb 14.4 oz (102.014 kg). Her oral temperature is 98.2 F (36.8 C). Her blood pressure is 149/63 and her pulse is 60. Her respiration is 20 and oxygen saturation is 99%. .   The patient has some tenderness to palpation in the right mid humerus region and extending more distally towards  the elbow. No tenderness to palpation on the left.  Lab Findings: Lab Results  Component Value Date   WBC 7.4 10/15/2012   HGB 9.7* 10/15/2012   HCT 30.3* 10/15/2012   MCV 89.5 10/15/2012   PLT 331 10/15/2012     Radiographic Findings: Nm Pet Image Restag (ps) Skull Base To Thigh  10/15/2012   CLINICAL DATA:  Subsequent treatment strategy for breast cancer. Skeletal metastases.  EXAM: NUCLEAR MEDICINE PET SKULL BASE TO THIGH  FASTING BLOOD GLUCOSE:  Value:  191 mg/dl  TECHNIQUE: 16.1 mCi W-96 FDG was injected intravenously. CT data was obtained and used for attenuation correction and anatomic localization only. (This was not acquired as a diagnostic CT examination.) Additional exam technical data entered on technologist worksheet.  COMPARISON:  Bone scan 11/16/2010, CT scan 04/13/2012  FINDINGS: NECK  No hypermetabolic lymph nodes in the neck.  CHEST  No hypermetabolic mediastinal or hilar nodes. No suspicious pulmonary nodules on the CT scan.  ABDOMEN/PELVIS  No abnormal hypermetabolic activity within the liver, pancreas, adrenal glands, or spleen. No hypermetabolic lymph nodes in the abdomen or pelvis.  SKELETON  There is widespread diffuse hypermetabolic skeletal metastasis. CT portion exam demonstrates extensive lytic and sclerotic lesions through the axillary and appendicular skeleton. The sclerotic lesions on the CT portion are similar to comparison CT. Exemplary lesions include lesion within the proximal left humerus within SUV max 8.3. Right humerus with SUV max = 6.6 . Left iliac bone with SUV max = 6.3 There is metastatic disease within both the left and right proximal femurs.  IMPRESSION: 1. 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  2. No evidence of metastatic disease to the soft tissues of the neck, chest, abdomen, or pelvis.   Electronically Signed   By: Genevive Bi M.D.   On: 10/15/2012 12:29    Impression:    The patient has metastatic  breast cancer with bony metastases. She is an appropriate candidate I believe for palliative radiotherapy to both the right and left upper extremity  I discussed with the patient a potential 2 week course of treatment to both of these areas. We discussed the possible benefit of treatment as well as the possible side effects and risks. I would expect that the patient would tolerate such a treatment well.  All of her questions were answered and she does wish to proceed with this treatment plan. She would like to begin as soon as possible.  Plan:  The patient will proceed with  a simulation later this week such that we can treatment.  I spent 15 minutes with the patient today, the majority of which was spent counseling the patient on the diagnosis of cancer and coordinating care.   Radene Gunning, M.D., Ph.D.

## 2012-10-21 NOTE — Progress Notes (Signed)
Please see the Nurse Progress Note in the MD Initial Consult Encounter for this patient. 

## 2012-10-23 ENCOUNTER — Ambulatory Visit
Admission: RE | Admit: 2012-10-23 | Discharge: 2012-10-23 | Disposition: A | Discharge status: Home / Self Care | Payer: Medicare Other | Source: Ambulatory Visit | Attending: Radiation Oncology | Admitting: Radiation Oncology

## 2012-10-23 DIAGNOSIS — Z79899 Other long term (current) drug therapy: Secondary | ICD-10-CM | POA: Insufficient documentation

## 2012-10-23 DIAGNOSIS — C7951 Secondary malignant neoplasm of bone: Secondary | ICD-10-CM

## 2012-10-23 DIAGNOSIS — Z510 Encounter for antineoplastic radiation therapy: Secondary | ICD-10-CM | POA: Insufficient documentation

## 2012-10-23 DIAGNOSIS — C50919 Malignant neoplasm of unspecified site of unspecified female breast: Secondary | ICD-10-CM | POA: Insufficient documentation

## 2012-10-23 NOTE — Progress Notes (Signed)
  Radiation Oncology         [phone]) [phone] ________________________________  Name: SKI POLICH MRN: [id number]  Date: 10/23/2012  DOB: 29-Nov-1940  SIMULATION AND TREATMENT PLANNING NOTE  DIAGNOSIS:  Metastatic breast cancer  NARRATIVE:  The patient was brought to the CT Simulation planning suite.  Identity was confirmed.  All relevant records and images related to the planned course of therapy were reviewed.   Written consent to proceed with treatment was confirmed which was freely given after reviewing the details related to the planned course of therapy had been reviewed with the patient.  The patient will receive radiation treatment to the same target areas: The right upper extremity and the left upper extremity. Then, the patient was set-up in a stable reproducible  supine position for radiation therapy.  CT images were obtained.  Surface markings were placed.   A customized VAC lock bag was constructed to help with patient immobilization with respect to the arms bilaterally. Additionally, a Accuform device was constructed to immobilize the head and neck. These 2 complex treatment devices will be used daily.   The CT images were loaded into the planning software.  Then the target and avoidance structures were contoured.  Treatment planning then occurred.  The radiation prescription was entered and confirmed.  A total of 4  complex treatment devices were fabricated which relate to the designed radiation treatment fields: 2 fields for the right upper extremity and 2 fields for the left upper extremity. Each of these customized fields/ complex treatment devices will be used on a daily basis during the radiation course. I have requested : Isodose Plan.   PLAN:  The patient will receive 30  Gy in 10 fractions to both target areas .  ________________________________   Radene Gunning, MD, PhD

## 2012-10-24 ENCOUNTER — Other Ambulatory Visit: Payer: Self-pay | Admitting: *Deleted

## 2012-10-24 MED ORDER — CARVEDILOL 12.5 MG PO TABS: 12.5000 mg | ORAL_TABLET | Freq: Two times a day (BID) | ORAL | Status: DC

## 2012-10-25 ENCOUNTER — Other Ambulatory Visit: Payer: Self-pay

## 2012-10-25 MED ORDER — METFORMIN HCL 500 MG PO TABS: 500.0000 mg | ORAL_TABLET | Freq: Every morning | ORAL | Status: DC

## 2012-10-25 MED ORDER — LEVOTHYROXINE SODIUM 200 MCG PO TABS: 200.0000 ug | ORAL_TABLET | Freq: Every day | ORAL | Status: DC

## 2012-10-25 MED ORDER — HYDROCHLOROTHIAZIDE 12.5 MG PO CAPS: 12.5000 mg | ORAL_CAPSULE | Freq: Every day | ORAL | Status: DC

## 2012-10-29 ENCOUNTER — Ambulatory Visit (INDEPENDENT_AMBULATORY_CARE_PROVIDER_SITE_OTHER): Payer: Medicare Other | Admitting: Physician Assistant

## 2012-10-29 ENCOUNTER — Encounter: Payer: Self-pay | Admitting: Physician Assistant

## 2012-10-29 VITALS — BP 118/78 | HR 72 | Temp 98.2°F | Resp 16 | Ht 63.0 in | Wt 226.0 lb

## 2012-10-29 DIAGNOSIS — R609 Edema, unspecified: Secondary | ICD-10-CM

## 2012-10-29 DIAGNOSIS — R600 Localized edema: Secondary | ICD-10-CM

## 2012-10-29 NOTE — Patient Instructions (Signed)
Please purchase some compression stockings.  Wear them throughout the day, taking them off at bedtime.  Please keep elevating legs while sitting at home and while in bed.  Follow-up in 2 weeks with myself or Dr. Abner Greenspan.  Peripheral Edema You have swelling in your legs (peripheral edema). This swelling is due to excess accumulation of salt and water in your body. Edema may be a sign of heart, kidney or liver disease, or a side effect of a medication. It may also be due to problems in the leg veins. Elevating your legs and using special support stockings may be very helpful, if the cause of the swelling is due to poor venous circulation. Avoid long periods of standing, whatever the cause. Treatment of edema depends on identifying the cause. Chips, pretzels, pickles and other salty foods should be avoided. Restricting salt in your diet is almost always needed. Water pills (diuretics) are often used to remove the excess salt and water from your body via urine. These medicines prevent the kidney from reabsorbing sodium. This increases urine flow. Diuretic treatment may also result in lowering of potassium levels in your body. Potassium supplements may be needed if you have to use diuretics daily. Daily weights can help you keep track of your progress in clearing your edema. You should call your caregiver for follow up care as recommended. SEEK IMMEDIATE MEDICAL CARE IF:   You have increased swelling, pain, redness, or heat in your legs.  You develop shortness of breath, especially when lying down.  You develop chest or abdominal pain, weakness, or fainting.  You have a fever. Document Released: 02/24/2004 Document Revised: 04/10/2011 Document Reviewed: 02/03/2009 [facility] Patient Information 2014 [place], [place].

## 2012-10-29 NOTE — Progress Notes (Signed)
Patient ID: Brittney Cunningham, female   DOB: 1940-06-20, 72 y.o.   MRN: [id number]  Patient presents to clinic today c/o swelling in her lower legs over the past 5 days.  Patient states that swelling is worse as the day progresses, and reduces overnight while she is sleeping.  Denies leg pain, but endorses tightness in feet and ankles 2/2 swelling.  Denies history of CHF.  No recent medication changes.  Denies shortness of breath, PND, orthopnea.  Has compression stockings at home because she has had this issue in the past but has not worn them.  Tries to monitor her salt intake and endorses taking her HTN medications as prescribed.  Denies any change in color or warmth to LLE.  Denies recent surgery or prolonged immobilization.  Does endorse she is more sedentary than in the past.   Past Medical History  Diagnosis Date  . Anemia   . Depression   . Diabetes mellitus type II   . GERD (gastroesophageal reflux disease)   . Hyperlipidemia   . Hypothyroidism   . Osteoarthritis   . Carotid artery stenosis     bilateral  . CAD (coronary artery disease)   . OSA (obstructive sleep apnea)   . Breast cancer     right breast cancer-invasive  ductal carcinoma (StageIV)  . Hypertension   . Anxiety   . Carotid artery occlusion   . Unspecified constipation 03/15/2012  . Bursitis   . Collagen vascular disease     Current Outpatient Prescriptions on File Prior to Visit  Medication Sig Dispense Refill  . aspirin 81 MG chewable tablet Chew 81 mg by mouth daily.      Marland Kitchen atorvastatin (LIPITOR) 40 MG tablet Take 1 tablet (40 mg total) by mouth daily.  90 tablet  1  . carvedilol (COREG) 12.5 MG tablet Take 1 tablet (12.5 mg total) by mouth 2 (two) times daily with a meal.  60 tablet  0  . chlorhexidine (PERIDEX) 0.12 % solution Rinse for 90  seconds  with 15 mLs three times daily. Spit out excess. Do NOT swallow.      . clopidogrel (PLAVIX) 75 MG tablet Take 1 tablet (75 mg total) by mouth daily.  30 tablet  6   . cyanocobalamin (,VITAMIN B-12,) 1000 MCG/ML injection Inject 1,000 mcg into the muscle every 30 (thirty) days. Last dose 03/09/2011      . darbepoetin (ARANESP) 200 MCG/0.4ML SOLN Inject 200 mcg into the skin once. Every 2 weeks      . fulvestrant (FASLODEX) 250 MG/5ML injection Inject 500 mg into the muscle every 30 (thirty) days. One injection each buttock over 1-2 minutes. Warm prior to use.      Marland Kitchen glycerin adult (GLYCERIN ADULT) 2 G SUPP Place 1 suppository rectally once as needed (constipation).  10 suppository  0  . hydrochlorothiazide (MICROZIDE) 12.5 MG capsule Take 1 capsule (12.5 mg total) by mouth daily.  30 capsule  3  . ibuprofen (ADVIL) 200 MG tablet Take 200 mg by mouth 3 (three) times daily.        . insulin glargine (LANTUS) 100 UNIT/ML injection Inject 60 Units into the skin at bedtime. Pt wants vials.  30 mL  3  . levothyroxine (SYNTHROID, LEVOTHROID) 200 MCG tablet Take 1 tablet (200 mcg total) by mouth daily before breakfast.  30 tablet  3  . levothyroxine (SYNTHROID, LEVOTHROID) 75 MCG tablet Take 1 tablet (75 mcg total) by mouth daily.  30 tablet  3  .  losartan (COZAAR) 50 MG tablet Take 1 tablet (50 mg total) by mouth daily.  30 tablet  5  . metFORMIN (GLUCOPHAGE) 500 MG tablet Take 1 tablet (500 mg total) by mouth every morning.  30 tablet  3  . morphine (MS CONTIN) 30 MG 12 hr tablet Take 1 tablet (30 mg total) by mouth 2 (two) times daily.  60 tablet  0  . morphine (MSIR) 15 MG tablet Take 1 tablet (15 mg total) by mouth every 4 (four) hours as needed for pain.  60 tablet  0  . sennosides-docusate sodium (SENOKOT-S) 8.6-50 MG tablet 1 or 2 tabs by mouth daily      . [DISCONTINUED] simvastatin (ZOCOR) 40 MG tablet Take 40 mg by mouth every evening.       No current facility-administered medications on file prior to visit.    Allergies  Allergen Reactions  . Chlorhexidine Hives, Itching and Rash    This was most likely a CONTACT DERMATITIS versus true systemic  allergic reaction  . Adhesive [Tape] Hives  . Codeine Swelling    Family History  Problem Relation Age of Onset  . Arthritis    . Coronary artery disease      first degree relative  . Stroke      first degree relative  . Diabetes Mother   . Heart disease Mother   . Hypertension Mother   . Cancer Mother   . Deep vein thrombosis Mother   . Colon cancer Neg Hx   . Stomach cancer Neg Hx   . Cancer Father     Pancreatic cancer  . Breast cancer Paternal Aunt 66    History   Social History  . Marital Status: Married    Spouse Name: N/A    Number of Children: 3  . Years of Education: N/A   Occupational History  . 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 Smoking status: Former Smoker -- 1.00 packs/day for 20 years    Types: Cigarettes    Quit date: 01/31/1988  . Smokeless tobacco: Never Used  . Alcohol Use: No  . Drug Use: No  . Sexual Activity: Not Currently    Birth Control/ Protection: Post-menopausal   Other Topics Concern  . None   Social History Narrative   Retired-ran a daycare facility for 40 years.   Married- 43 years    2 sons    1 daughter - Engineer, civil (consulting) midwife   Former smoker-quit in [place].    ROS See HPI.  All other ROS negative.  Filed Vitals:   10/29/12 0923  BP: 118/78  Pulse: 72  Temp: 98.2 F (36.8 C)  Resp: 16    Physical Exam  Vitals reviewed. Constitutional: She is oriented to person, place, and time and well-developed, well-nourished, and in no distress.  HENT:  Head: Normocephalic and atraumatic.  Eyes: Conjunctivae are normal.  Neck: Neck supple.  Cardiovascular: Normal rate, regular rhythm, normal heart sounds and intact distal pulses.   Pulses:      Dorsalis pedis pulses are 2+ on the right side, and 2+ on the left side.       Posterior tibial pulses are 2+ on the right side, and 2+ on the left side.  Pulmonary/Chest: Effort normal and breath sounds normal. No respiratory distress. She has no wheezes. She  has no rales. She exhibits no tenderness.  Musculoskeletal:       Right lower leg: She  exhibits swelling. She exhibits no tenderness, no bony tenderness, no edema, no deformity and no laceration.       Left lower leg: She exhibits edema. She exhibits no tenderness, no bony tenderness, no deformity and no laceration.  Neurological: She is alert and oriented to person, place, and time.  Skin: Skin is warm and dry. No rash noted.     Recent Results (from the past 2160 hour(s))  [facility]     Status: Abnormal   Collection Time    08/05/12  1:14 PM      Result Value Range   Ferritin 271 (*) 9 - 269 ng/ml  COMPREHENSIVE METABOLIC PANEL ([facility]13)     Status: Abnormal   Collection Time    08/05/12  1:14 PM      Result Value Range   Sodium 139  136 - 145 mEq/L   Potassium 4.9  3.5 - 5.1 mEq/L   Chloride 108  98 - 109 mEq/L   CO2 22  22 - 29 mEq/L   Glucose 173 (*) 70 - 140 mg/dl   BUN 96.2 (*) 7.0 - 95.2 mg/dL   Creatinine 1.6 (*) 0.6 - 1.1 mg/dL   Total Bilirubin 8.41  0.20 - 1.20 mg/dL   Alkaline Phosphatase 124  40 - 150 U/L   AST 12  5 - 34 U/L   ALT 10  0 - 55 U/L   Total Protein 7.3  6.4 - 8.3 g/dL   Albumin 3.2 (*) 3.5 - 5.0 g/dL   Calcium 9.6  8.4 - 32.4 mg/dL  CBC WITH DIFFERENTIAL     Status: Abnormal   Collection Time    08/05/12  1:14 PM      Result Value Range   WBC 8.3  3.9 - 10.3 10e3/uL   NEUT# 5.0  1.5 - 6.5 10e3/uL   HGB 10.5 (*) 11.6 - 15.9 g/dL   HCT 40.1 (*) 02.7 - 25.3 %   Platelets 290  145 - 400 10e3/uL   MCV 90.6  79.5 - 101.0 fL   MCH 29.7  25.1 - 34.0 pg   MCHC 32.8  31.5 - 36.0 g/dL   RBC 6.64 (*) 4.03 - 4.74 10e6/uL   RDW 15.6 (*) 11.2 - 14.5 %   lymph# 2.3  0.9 - 3.3 10e3/uL   MONO# 0.7  0.1 - 0.9 10e3/uL   Eosinophils Absolute 0.3  0.0 - 0.5 10e3/uL   Basophils Absolute 0.1  0.0 - 0.1 10e3/uL   NEUT% 59.8  38.4 - 76.8 %   LYMPH% 27.4  14.0 - 49.7 %   MONO% 8.6  0.0 - 14.0 %   EOS% 3.5  0.0 - 7.0 %   BASO% 0.7  0.0 - 2.0 %  [facility]      Status: None   Collection Time    08/19/12  9:44 AM      Result Value Range   Ferritin 258  9 - 269 ng/ml  CBC WITH DIFFERENTIAL     Status: Abnormal   Collection Time    08/19/12  9:44 AM      Result Value Range   WBC 6.3  3.9 - 10.3 10e3/uL   NEUT# 3.9  1.5 - 6.5 10e3/uL   HGB 10.7 (*) 11.6 - 15.9 g/dL   HCT 25.9 (*) 56.3 - 87.5 %   Platelets 267  145 - 400 10e3/uL   MCV 90.1  79.5 - 101.0 fL   MCH 29.4  25.1 - 34.0  pg   MCHC 32.7  31.5 - 36.0 g/dL   RBC 3.08 (*) 6.57 - 8.46 10e6/uL   RDW 15.4 (*) 11.2 - 14.5 %   lymph# 1.6  0.9 - 3.3 10e3/uL   MONO# 0.5  0.1 - 0.9 10e3/uL   Eosinophils Absolute 0.2  0.0 - 0.5 10e3/uL   Basophils Absolute 0.0  0.0 - 0.1 10e3/uL   NEUT% 62.4  38.4 - 76.8 %   LYMPH% 24.9  14.0 - 49.7 %   MONO% 8.2  0.0 - 14.0 %   EOS% 3.8  0.0 - 7.0 %   BASO% 0.7  0.0 - 2.0 %  [facility]     Status: Abnormal   Collection Time    09/02/12 10:17 AM      Result Value Range   Ferritin 305 (*) 9 - 269 ng/ml  COMPREHENSIVE METABOLIC PANEL ([facility]13)     Status: Abnormal   Collection Time    09/02/12 10:17 AM      Result Value Range   Sodium 137  136 - 145 mEq/L   Potassium 5.1  3.5 - 5.1 mEq/L   Chloride 107  98 - 109 mEq/L   CO2 19 (*) 22 - 29 mEq/L   Glucose 181 (*) 70 - 140 mg/dl   BUN 96.2 (*) 7.0 - 95.2 mg/dL   Creatinine 1.4 (*) 0.6 - 1.1 mg/dL   Total Bilirubin 8.41  0.20 - 1.20 mg/dL   Alkaline Phosphatase 120  40 - 150 U/L   AST 21  5 - 34 U/L   ALT 9  0 - 55 U/L   Total Protein 7.1  6.4 - 8.3 g/dL   Albumin 3.1 (*) 3.5 - 5.0 g/dL   Calcium 9.4  8.4 - 32.4 mg/dL  CBC WITH DIFFERENTIAL     Status: Abnormal   Collection Time    09/02/12 10:17 AM      Result Value Range   WBC 8.3  3.9 - 10.3 10e3/uL   NEUT# 5.5  1.5 - 6.5 10e3/uL   HGB 10.7 (*) 11.6 - 15.9 g/dL   HCT 40.1 (*) 02.7 - 25.3 %   Platelets 258  145 - 400 10e3/uL   MCV 89.5  79.5 - 101.0 fL   MCH 29.1  25.1 - 34.0 pg   MCHC 32.5  31.5 - 36.0 g/dL   RBC 6.64 (*) 4.03 - 4.74  10e6/uL   RDW 15.3 (*) 11.2 - 14.5 %   lymph# 1.8  0.9 - 3.3 10e3/uL   MONO# 0.6  0.1 - 0.9 10e3/uL   Eosinophils Absolute 0.3  0.0 - 0.5 10e3/uL   Basophils Absolute 0.0  0.0 - 0.1 10e3/uL   NEUT% 66.5  38.4 - 76.8 %   LYMPH% 22.1  14.0 - 49.7 %   MONO% 6.7  0.0 - 14.0 %   EOS% 4.1  0.0 - 7.0 %   BASO% 0.6  0.0 - 2.0 %  [facility]     Status: Abnormal   Collection Time    09/16/12  1:23 PM      Result Value Range   Ferritin 343 (*) 9 - 269 ng/ml  CBC WITH DIFFERENTIAL     Status: Abnormal   Collection Time    09/16/12  1:23 PM      Result Value Range   WBC 8.2  3.9 - 10.3 10e3/uL   NEUT# 4.7  1.5 - 6.5 10e3/uL   HGB 10.4 (*) 11.6 - 15.9 g/dL  HCT 32.2 (*) 34.8 - 46.6 %   Platelets 236  145 - 400 10e3/uL   MCV 90.3  79.5 - 101.0 fL   MCH 29.1  25.1 - 34.0 pg   MCHC 32.2  31.5 - 36.0 g/dL   RBC 7.82 (*) 9.56 - 2.13 10e6/uL   RDW 15.8 (*) 11.2 - 14.5 %   lymph# 2.5  0.9 - 3.3 10e3/uL   MONO# 0.7  0.1 - 0.9 10e3/uL   Eosinophils Absolute 0.3  0.0 - 0.5 10e3/uL   Basophils Absolute 0.1  0.0 - 0.1 10e3/uL   NEUT% 56.9  38.4 - 76.8 %   LYMPH% 30.1  14.0 - 49.7 %   MONO% 8.9  0.0 - 14.0 %   EOS% 3.2  0.0 - 7.0 %   BASO% 0.9  0.0 - 2.0 %  GLUCOSE, CAPILLARY     Status: Abnormal   Collection Time    10/15/12  9:00 AM      Result Value Range   Glucose-Capillary 191 (*) 70 - 99 mg/dL  CBC WITH DIFFERENTIAL     Status: Abnormal   Collection Time    10/15/12 10:59 AM      Result Value Range   WBC 7.4  3.9 - 10.3 10e3/uL   NEUT# 4.2  1.5 - 6.5 10e3/uL   HGB 9.7 (*) 11.6 - 15.9 g/dL   HCT 08.6 (*) 57.8 - 46.9 %   Platelets 331  145 - 400 10e3/uL   MCV 89.5  79.5 - 101.0 fL   MCH 28.8  25.1 - 34.0 pg   MCHC 32.2  31.5 - 36.0 g/dL   RBC 6.29 (*) 5.28 - 4.13 10e6/uL   RDW 15.8 (*) 11.2 - 14.5 %   lymph# 2.2  0.9 - 3.3 10e3/uL   MONO# 0.6  0.1 - 0.9 10e3/uL   Eosinophils Absolute 0.3  0.0 - 0.5 10e3/uL   Basophils Absolute 0.1  0.0 - 0.1 10e3/uL   NEUT% 57.0  38.4 -  76.8 %   LYMPH% 30.2  14.0 - 49.7 %   MONO% 8.2  0.0 - 14.0 %   EOS% 3.8  0.0 - 7.0 %   BASO% 0.8  0.0 - 2.0 %  COMPREHENSIVE METABOLIC PANEL ([facility]13)     Status: Abnormal   Collection Time    10/15/12 10:59 AM      Result Value Range   Sodium 136  136 - 145 mEq/L   Potassium 4.9  3.5 - 5.1 mEq/L   Chloride 103  98 - 109 mEq/L   CO2 24  22 - 29 mEq/L   Glucose 162 (*) 70 - 140 mg/dl   BUN 24.4  7.0 - 01.0 mg/dL   Creatinine 1.1  0.6 - 1.1 mg/dL   Total Bilirubin 2.72  0.20 - 1.20 mg/dL   Alkaline Phosphatase 179 (*) 40 - 150 U/L   AST 14  5 - 34 U/L   ALT 10  0 - 55 U/L   Total Protein 7.0  6.4 - 8.3 g/dL   Albumin 2.9 (*) 3.5 - 5.0 g/dL   Calcium 9.3  8.4 - 53.6 mg/dL    Assessment/Plan: Peripheral edema Most likely secondary to sedentary lifestyle and venous insufficiency.  Compression stockings.  Elevate legs while at home.  Continue monitoring salt intake.  Increase physical activity.  Follow-up in 2 weeks.

## 2012-10-29 NOTE — Assessment & Plan Note (Signed)
Most likely secondary to sedentary lifestyle and venous insufficiency.  Compression stockings.  Elevate legs while at home.  Continue monitoring salt intake.  Increase physical activity.  Follow-up in 2 weeks.

## 2012-10-30 ENCOUNTER — Ambulatory Visit
Admission: RE | Admit: 2012-10-30 | Discharge: 2012-10-30 | Disposition: A | Discharge status: Home / Self Care | Payer: Medicare Other | Source: Ambulatory Visit | Attending: Radiation Oncology | Admitting: Radiation Oncology

## 2012-10-30 ENCOUNTER — Other Ambulatory Visit: Payer: Self-pay | Admitting: *Deleted

## 2012-10-30 ENCOUNTER — Ambulatory Visit (HOSPITAL_BASED_OUTPATIENT_CLINIC_OR_DEPARTMENT_OTHER): Payer: Medicare Other

## 2012-10-30 ENCOUNTER — Ambulatory Visit (HOSPITAL_BASED_OUTPATIENT_CLINIC_OR_DEPARTMENT_OTHER): Payer: Medicare Other | Admitting: Lab

## 2012-10-30 VITALS — BP 153/48 | HR 65 | Temp 98.3°F

## 2012-10-30 DIAGNOSIS — C50911 Malignant neoplasm of unspecified site of right female breast: Secondary | ICD-10-CM

## 2012-10-30 DIAGNOSIS — D649 Anemia, unspecified: Secondary | ICD-10-CM

## 2012-10-30 DIAGNOSIS — C50619 Malignant neoplasm of axillary tail of unspecified female breast: Secondary | ICD-10-CM

## 2012-10-30 DIAGNOSIS — C7951 Secondary malignant neoplasm of bone: Secondary | ICD-10-CM

## 2012-10-30 DIAGNOSIS — Z5111 Encounter for antineoplastic chemotherapy: Secondary | ICD-10-CM

## 2012-10-30 DIAGNOSIS — C50919 Malignant neoplasm of unspecified site of unspecified female breast: Secondary | ICD-10-CM

## 2012-10-30 DIAGNOSIS — K219 Gastro-esophageal reflux disease without esophagitis: Secondary | ICD-10-CM

## 2012-10-30 LAB — CBC WITH DIFFERENTIAL/PLATELET
Basophils Absolute: 0 10*3/uL (ref 0.0–0.1)
Eosinophils Absolute: 0.3 10*3/uL (ref 0.0–0.5)
HCT: 32.4 % — ABNORMAL LOW (ref 34.8–46.6)
HGB: 10 g/dL — ABNORMAL LOW (ref 11.6–15.9)
LYMPH%: 29.8 % (ref 14.0–49.7)
MCV: 91.5 fL (ref 79.5–101.0)
MONO%: 8.9 % (ref 0.0–14.0)
NEUT#: 3.7 10*3/uL (ref 1.5–6.5)
Platelets: 261 10*3/uL (ref 145–400)

## 2012-10-30 MED ORDER — FULVESTRANT 250 MG/5ML IM SOLN
500.0000 mg | Freq: Once | INTRAMUSCULAR | Status: AC
  Administered 2012-10-30: 500 mg via INTRAMUSCULAR
  Filled 2012-10-30: qty 10

## 2012-10-30 MED ORDER — DARBEPOETIN ALFA-POLYSORBATE 200 MCG/0.4ML IJ SOLN
200.0000 ug | Freq: Once | INTRAMUSCULAR | Status: AC
  Administered 2012-10-30: 200 ug via SUBCUTANEOUS
  Filled 2012-10-30: qty 0.4

## 2012-10-30 NOTE — Progress Notes (Signed)
CBC required for Aranesp today

## 2012-10-31 ENCOUNTER — Ambulatory Visit
Admission: RE | Admit: 2012-10-31 | Discharge: 2012-10-31 | Disposition: A | Discharge status: Home / Self Care | Payer: Medicare Other | Source: Ambulatory Visit | Attending: Radiation Oncology | Admitting: Radiation Oncology

## 2012-11-01 ENCOUNTER — Ambulatory Visit
Admission: RE | Admit: 2012-11-01 | Discharge: 2012-11-01 | Disposition: A | Discharge status: Home / Self Care | Payer: Medicare Other | Source: Ambulatory Visit | Attending: Radiation Oncology | Admitting: Radiation Oncology

## 2012-11-01 ENCOUNTER — Encounter: Payer: Self-pay | Admitting: Radiation Oncology

## 2012-11-01 VITALS — BP 140/59 | HR 68 | Temp 98.6°F | Resp 20 | Wt 227.4 lb

## 2012-11-01 DIAGNOSIS — C7951 Secondary malignant neoplasm of bone: Secondary | ICD-10-CM

## 2012-11-01 NOTE — Progress Notes (Signed)
Department of Radiation Oncology  Phone:  [phone] Fax:        [phone]  Weekly Treatment Note    Name: Brittney Cunningham Date: 11/01/2012 MRN: [id number] DOB: 23-Nov-1940   Current dose: 6 Gy  Current fraction: 2   MEDICATIONS: Current Outpatient Prescriptions  Medication Sig Dispense Refill  . aspirin 81 MG chewable tablet Chew 81 mg by mouth daily.      Marland Kitchen atorvastatin (LIPITOR) 40 MG tablet Take 1 tablet (40 mg total) by mouth daily.  90 tablet  1  . carvedilol (COREG) 12.5 MG tablet Take 1 tablet (12.5 mg total) by mouth 2 (two) times daily with a meal.  60 tablet  0  . chlorhexidine (PERIDEX) 0.12 % solution Rinse for 90  seconds  with 15 mLs three times daily. Spit out excess. Do NOT swallow.      . clopidogrel (PLAVIX) 75 MG tablet Take 1 tablet (75 mg total) by mouth daily.  30 tablet  6  . cyanocobalamin (,VITAMIN B-12,) 1000 MCG/ML injection Inject 1,000 mcg into the muscle every 30 (thirty) days. Last dose 03/09/2011      . darbepoetin (ARANESP) 200 MCG/0.4ML SOLN Inject 200 mcg into the skin once. Every 2 weeks      . fulvestrant (FASLODEX) 250 MG/5ML injection Inject 500 mg into the muscle every 30 (thirty) days. One injection each buttock over 1-2 minutes. Warm prior to use.      Marland Kitchen glycerin adult (GLYCERIN ADULT) 2 G SUPP Place 1 suppository rectally once as needed (constipation).  10 suppository  0  . hydrochlorothiazide (MICROZIDE) 12.5 MG capsule Take 1 capsule (12.5 mg total) by mouth daily.  30 capsule  3  . ibuprofen (ADVIL) 200 MG tablet Take 200 mg by mouth 3 (three) times daily.        . insulin glargine (LANTUS) 100 UNIT/ML injection Inject 60 Units into the skin at bedtime. Pt wants vials.  30 mL  3  . levothyroxine (SYNTHROID, LEVOTHROID) 200 MCG tablet Take 1 tablet (200 mcg total) by mouth daily before breakfast.  30 tablet  3  . levothyroxine (SYNTHROID, LEVOTHROID) 75 MCG tablet Take 1 tablet (75 mcg total) by mouth daily.  30 tablet  3  .  losartan (COZAAR) 50 MG tablet Take 1 tablet (50 mg total) by mouth daily.  30 tablet  5  . metFORMIN (GLUCOPHAGE) 500 MG tablet Take 1 tablet (500 mg total) by mouth every morning.  30 tablet  3  . morphine (MS CONTIN) 30 MG 12 hr tablet Take 1 tablet (30 mg total) by mouth 2 (two) times daily.  60 tablet  0  . morphine (MSIR) 15 MG tablet Take 1 tablet (15 mg total) by mouth every 4 (four) hours as needed for pain.  60 tablet  0  . sennosides-docusate sodium (SENOKOT-S) 8.6-50 MG tablet 1 or 2 tabs by mouth daily      . [DISCONTINUED] simvastatin (ZOCOR) 40 MG tablet Take 40 mg by mouth every evening.       No current facility-administered medications for this encounter.     ALLERGIES: Chlorhexidine; Adhesive; and Codeine   LABORATORY DATA:  Lab Results  Component Value Date   WBC 6.5 10/30/2012   HGB 10.0* 10/30/2012   HCT 32.4* 10/30/2012   MCV 91.5 10/30/2012   PLT 261 10/30/2012   Lab Results  Component Value Date   NA 136 10/15/2012   K 4.9 10/15/2012   CL 104 07/08/2012  CO2 24 10/15/2012   Lab Results  Component Value Date   ALT 10 10/15/2012   AST 14 10/15/2012   ALKPHOS 179* 10/15/2012   BILITOT 0.27 10/15/2012     NARRATIVE: Brittney Cunningham was seen today for weekly treatment management. The chart was checked and the patient's films were reviewed. The patient is doing well with treatment and her first week. No complaints of far.  PHYSICAL EXAMINATION: weight is 227 lb 6.4 oz (103.148 kg). Her oral temperature is 98.6 F (37 C). Her blood pressure is 140/59 and her pulse is 68. Her respiration is 20.        ASSESSMENT: The patient is doing satisfactorily with treatment.  PLAN: We will continue with the patient's radiation treatment as planned.

## 2012-11-01 NOTE — Progress Notes (Addendum)
Weekly rad txs 2/10 B/L humeus, Right > L pain moderate ,takes morphine helps, no nausea, takes stool softners for constipation,  Fatigue appetite fair, swelling b/l legs feet reviewed patient education fatigue, skin irritation, has had  Education before and aware verbal teach back

## 2012-11-03 NOTE — Progress Notes (Signed)
This encounter was created in error - please disregard.

## 2012-11-04 ENCOUNTER — Ambulatory Visit
Admission: RE | Admit: 2012-11-04 | Discharge: 2012-11-04 | Disposition: A | Discharge status: Home / Self Care | Payer: Medicare Other | Source: Ambulatory Visit | Attending: Radiation Oncology | Admitting: Radiation Oncology

## 2012-11-04 NOTE — Progress Notes (Signed)
  Radiation Oncology         [phone] ________________________________  Name: Brittney Cunningham MRN: [id number]  Date: 10/30/2012  DOB: 06-02-1940  Simulation Verification Note   NARRATIVE: The patient was brought to the treatment unit and placed in the planned treatment position. The clinical setup was verified. Then port films were obtained and uploaded to the radiation oncology medical record software.  The treatment beams were carefully compared against the planned radiation fields. The position, location, and shape of the radiation fields was reviewed. The targeted volume of tissue appears to be appropriately covered by the radiation beams. Based on my personal review, I approved the simulation verification. The patient's treatment will proceed as planned.  ________________________________   Radene Gunning, MD, PhD

## 2012-11-05 ENCOUNTER — Ambulatory Visit
Admission: RE | Admit: 2012-11-05 | Discharge: 2012-11-05 | Disposition: A | Discharge status: Home / Self Care | Payer: Medicare Other | Source: Ambulatory Visit | Attending: Radiation Oncology | Admitting: Radiation Oncology

## 2012-11-05 ENCOUNTER — Encounter: Payer: Self-pay | Admitting: *Deleted

## 2012-11-05 NOTE — Progress Notes (Signed)
[facility] Psychosocial Distress Screening Clinical Social Work  Clinical Social Work was referred by distress screening protocol.  The patient scored a 6 on the Psychosocial Distress Thermometer which indicates moderate distress. Clinical Social Worker contacted pt at home to assess for distress and other psychosocial needs.  Pt stated she was doing "ok", but was still experiencing some pain.  CSW encouraged pt to talk with her physician regarding her pain; which pt plans to do at her next appointment.  CSW also discussed support services available at [facility], and encouraged pt to call with any questions or concerns.  Pt did not express any additional concerns at this time, and was appreciative of CSW contact.  Tamala Julian, MSW, LCSW Clinical Social Worker [facility] [phone]

## 2012-11-06 ENCOUNTER — Ambulatory Visit: Payer: Medicare Other | Admitting: Radiation Oncology

## 2012-11-06 ENCOUNTER — Ambulatory Visit
Admission: RE | Admit: 2012-11-06 | Discharge: 2012-11-06 | Disposition: A | Discharge status: Home / Self Care | Payer: Medicare Other | Source: Ambulatory Visit | Attending: Radiation Oncology | Admitting: Radiation Oncology

## 2012-11-07 ENCOUNTER — Ambulatory Visit
Admission: RE | Admit: 2012-11-07 | Discharge: 2012-11-07 | Disposition: A | Discharge status: Home / Self Care | Payer: Medicare Other | Source: Ambulatory Visit | Attending: Radiation Oncology | Admitting: Radiation Oncology

## 2012-11-08 ENCOUNTER — Ambulatory Visit
Admission: RE | Admit: 2012-11-08 | Discharge: 2012-11-08 | Disposition: A | Discharge status: Home / Self Care | Payer: Medicare Other | Source: Ambulatory Visit | Attending: Radiation Oncology | Admitting: Radiation Oncology

## 2012-11-08 ENCOUNTER — Other Ambulatory Visit: Payer: Self-pay | Admitting: *Deleted

## 2012-11-08 DIAGNOSIS — C50911 Malignant neoplasm of unspecified site of right female breast: Secondary | ICD-10-CM

## 2012-11-11 ENCOUNTER — Ambulatory Visit: Payer: Self-pay | Admitting: Adult Health

## 2012-11-11 ENCOUNTER — Other Ambulatory Visit: Payer: Self-pay | Admitting: Lab

## 2012-11-11 ENCOUNTER — Ambulatory Visit (HOSPITAL_BASED_OUTPATIENT_CLINIC_OR_DEPARTMENT_OTHER): Payer: Medicare Other

## 2012-11-11 ENCOUNTER — Ambulatory Visit (HOSPITAL_BASED_OUTPATIENT_CLINIC_OR_DEPARTMENT_OTHER): Payer: Medicare Other | Admitting: Physician Assistant

## 2012-11-11 ENCOUNTER — Encounter: Payer: Self-pay | Admitting: Physician Assistant

## 2012-11-11 ENCOUNTER — Encounter (INDEPENDENT_AMBULATORY_CARE_PROVIDER_SITE_OTHER): Payer: Self-pay

## 2012-11-11 ENCOUNTER — Other Ambulatory Visit (HOSPITAL_BASED_OUTPATIENT_CLINIC_OR_DEPARTMENT_OTHER): Payer: Medicare Other | Admitting: Lab

## 2012-11-11 ENCOUNTER — Ambulatory Visit
Admission: RE | Admit: 2012-11-11 | Discharge: 2012-11-11 | Disposition: A | Discharge status: Home / Self Care | Payer: Medicare Other | Source: Ambulatory Visit | Attending: Radiation Oncology | Admitting: Radiation Oncology

## 2012-11-11 ENCOUNTER — Telehealth: Payer: Self-pay | Admitting: Oncology

## 2012-11-11 VITALS — BP 111/72 | HR 82 | Temp 98.4°F | Resp 20 | Ht 63.0 in | Wt 224.0 lb

## 2012-11-11 DIAGNOSIS — N189 Chronic kidney disease, unspecified: Secondary | ICD-10-CM

## 2012-11-11 DIAGNOSIS — D631 Anemia in chronic kidney disease: Secondary | ICD-10-CM | POA: Insufficient documentation

## 2012-11-11 DIAGNOSIS — C50619 Malignant neoplasm of axillary tail of unspecified female breast: Secondary | ICD-10-CM

## 2012-11-11 DIAGNOSIS — C50911 Malignant neoplasm of unspecified site of right female breast: Secondary | ICD-10-CM

## 2012-11-11 DIAGNOSIS — C7951 Secondary malignant neoplasm of bone: Secondary | ICD-10-CM

## 2012-11-11 DIAGNOSIS — Z5111 Encounter for antineoplastic chemotherapy: Secondary | ICD-10-CM

## 2012-11-11 DIAGNOSIS — E538 Deficiency of other specified B group vitamins: Secondary | ICD-10-CM

## 2012-11-11 DIAGNOSIS — D649 Anemia, unspecified: Secondary | ICD-10-CM

## 2012-11-11 DIAGNOSIS — K219 Gastro-esophageal reflux disease without esophagitis: Secondary | ICD-10-CM

## 2012-11-11 DIAGNOSIS — C50919 Malignant neoplasm of unspecified site of unspecified female breast: Secondary | ICD-10-CM

## 2012-11-11 DIAGNOSIS — N289 Disorder of kidney and ureter, unspecified: Secondary | ICD-10-CM

## 2012-11-11 LAB — COMPREHENSIVE METABOLIC PANEL (CC13)
ALT: 10 U/L (ref 0–55)
Anion Gap: 10 mEq/L (ref 3–11)
BUN: 24.9 mg/dL (ref 7.0–26.0)
CO2: 23 mEq/L (ref 22–29)
Calcium: 9.3 mg/dL (ref 8.4–10.4)
Creatinine: 1.1 mg/dL (ref 0.6–1.1)
Total Bilirubin: 0.55 mg/dL (ref 0.20–1.20)

## 2012-11-11 LAB — CBC WITH DIFFERENTIAL/PLATELET
BASO%: 0.7 % (ref 0.0–2.0)
Basophils Absolute: 0 10*3/uL (ref 0.0–0.1)
EOS%: 4.1 % (ref 0.0–7.0)
HCT: 32.7 % — ABNORMAL LOW (ref 34.8–46.6)
HGB: 10.7 g/dL — ABNORMAL LOW (ref 11.6–15.9)
LYMPH%: 22.7 % (ref 14.0–49.7)
MCH: 29.4 pg (ref 25.1–34.0)
MCHC: 32.6 g/dL (ref 31.5–36.0)
MONO#: 0.5 10*3/uL (ref 0.1–0.9)
NEUT%: 64.7 % (ref 38.4–76.8)
Platelets: 237 10*3/uL (ref 145–400)

## 2012-11-11 MED ORDER — CYANOCOBALAMIN 1000 MCG/ML IJ SOLN
INTRAMUSCULAR | Status: AC
  Filled 2012-11-11: qty 1

## 2012-11-11 MED ORDER — DARBEPOETIN ALFA-POLYSORBATE 200 MCG/0.4ML IJ SOLN
200.0000 ug | Freq: Once | INTRAMUSCULAR | Status: AC
  Administered 2012-11-11: 200 ug via SUBCUTANEOUS
  Filled 2012-11-11: qty 0.4

## 2012-11-11 MED ORDER — MORPHINE SULFATE ER 30 MG PO TBCR: 30.0000 mg | EXTENDED_RELEASE_TABLET | Freq: Two times a day (BID) | ORAL | Status: DC

## 2012-11-11 MED ORDER — CYANOCOBALAMIN 1000 MCG/ML IJ SOLN
1000.0000 ug | Freq: Once | INTRAMUSCULAR | Status: AC
  Administered 2012-11-11: 1000 ug via INTRAMUSCULAR

## 2012-11-11 MED ORDER — HEPARIN SOD (PORK) LOCK FLUSH 100 UNIT/ML IV SOLN
500.0000 [IU] | Freq: Once | INTRAVENOUS | Status: AC
  Administered 2012-11-11: 500 [IU] via INTRAVENOUS
  Filled 2012-11-11: qty 5

## 2012-11-11 MED ORDER — SODIUM CHLORIDE 0.9 % IJ SOLN
10.0000 mL | INTRAMUSCULAR | Status: DC | PRN
  Administered 2012-11-11: 10 mL via INTRAVENOUS
  Filled 2012-11-11: qty 10

## 2012-11-11 MED ORDER — FULVESTRANT 250 MG/5ML IM SOLN
500.0000 mg | Freq: Once | INTRAMUSCULAR | Status: AC
  Administered 2012-11-11: 500 mg via INTRAMUSCULAR
  Filled 2012-11-11: qty 10

## 2012-11-11 NOTE — Progress Notes (Signed)
Patient ID: Brittney Cunningham, female   DOB: 11-Jan-1941, 72 y.o.   MRN: [id number]  [facility]  [phone] [phone]  OFFICE PROGRESS NOTE   ID: AMBERROSE FRIEBEL   DOB: 1940-09-01  MR#: [id number]  MVH#:[id number]   PCP: Danise Edge, MD SU: Emelia Loron OTHER MD: Cindra Eves, Dorothy Puffer, Caryn Bee Supple  CHIEF COMPLAINT:  Metastatic Breast Cancer   HISTORY OF PRESENT ILLNESS: [name] was diagnosed with right breast carcinoma in May of 2011, a biopsy showing a grade 2 invasive ductal carcinoma, T2 NXM1, stage IV at presentation. There was bone only involvement. Tumor was strongly ER and PR positive, HER-2/neu negative, with an MIP-1 of 26%.  Brittney Cunningham was treated neoadjuvantly with letrozole and zoledronic acid beginning in June of 2011. Brittney Cunningham is status post right modified radical mastectomy in February 2012 for a ypT2 ypN2, grade 1 invasive ductal carcinoma. Margins were negative.   Her subsequent history is as detailed below   INTERVAL HISTORY: Brittney Cunningham returns alone today for followup of her metastatic breast cancer. Brittney Cunningham's currently receiving active radiation therapy under care of Dr. Mitzi Hansen, bilaterally to the humerus bones. This has helped her pain significantly. In fact Brittney Cunningham continues to take the MS Contin, 30 mg twice daily, but has needed No short-acting morphine for breakthrough pain for several days now. Brittney Cunningham is maintaining regular bowel movements with the use of stool softeners and MiraLAX as needed.   With regards to the fulvestrant, this will be her third and final "loading dose", and from this point forward Brittney Cunningham will be receiving the injections on a monthly basis. Brittney Cunningham is tolerating them well. Brittney Cunningham's had no increased hot flashes. Brittney Cunningham does note some increased joint pain for approximately 3 days following the injection, which then resolves. Brittney Cunningham's had no problems with vaginal dryness, and denies any abnormal vaginal bleeding.  Brittney Cunningham is also tolerating her  Aranesp injections well, and her hemoglobin is holding steady. Brittney Cunningham also receives vitamin B 12 injections monthly, and is due for both of these today.   REVIEW OF SYSTEMS: Brittney Cunningham has had no fevers, chills, or night sweats. Her energy level is a little low, and Brittney Cunningham continues to have some moderate fatigue. Brittney Cunningham's had no rashes or skin changes and denies any abnormal bruising or bleeding. Her appetite is good. Brittney Cunningham's had no nausea or emesis. Brittney Cunningham's had no increased cough or phlegm production. Brittney Cunningham has some shortness of breath with exertion which is stable. Brittney Cunningham's had no chest pain. Brittney Cunningham denies abnormal headaches, dizziness, or change in vision. Brittney Cunningham has no new myalgias, arthralgias, or bony pain, and has had no increased peripheral swelling.  A detailed review of systems is otherwise stable and noncontributory.  PAST MEDICAL HISTORY: Past Medical History  Diagnosis Date  . Anemia   . Depression   . Diabetes mellitus type II   . GERD (gastroesophageal reflux disease)   . Hyperlipidemia   . Hypothyroidism   . Osteoarthritis   . Carotid artery stenosis     bilateral  . CAD (coronary artery disease)   . OSA (obstructive sleep apnea)   . Breast cancer     right breast cancer-invasive  ductal carcinoma (StageIV)  . Hypertension   . Anxiety   . Carotid artery occlusion   . Unspecified constipation 03/15/2012  . Bursitis   . Collagen vascular disease     PAST SURGICAL HISTORY: Past Surgical History  Procedure Laterality Date  . Incisional breast biopsy      remoted left  breast biopsy  . Cesarean section    . Other surgical history      GYN surgery  . Knee surgery      right knee surgery  . Carotid endarterectomy      right carotid  . Port-a-cath removal    . Modified mastectomy      Right breast  . Breast surgery    . Mastectomy    . Carotid stent      left    FAMILY HISTORY Family History  Problem Relation Age of Onset  . Arthritis    . Coronary artery disease      first degree  relative  . Stroke      first degree relative  . Diabetes Mother   . Heart disease Mother   . Hypertension Mother   . Cancer Mother   . Deep vein thrombosis Mother   . Colon cancer Neg Hx   . Stomach cancer Neg Hx   . Cancer Father     Pancreatic cancer  . Breast cancer Paternal Aunt 60  The patient's father died from pancreatic cancer at the age of 66. The patient's mother died from complications of diabetes and heart disease at the age of 54. The patient has 2 sisters and 2 brothers. There is no history of breast cancer or ovarian cancer in the immediate family. One of the patient's paternal aunts out of 6 paternal aunts had breast cancer diagnosed in her 71s.    GYNECOLOGIC HISTORY: The patient is GX, P3. First pregnancy to term age 15. Brittney Cunningham went through the change of life in her late 49s. Brittney Cunningham never took hormones.   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 Her husband, Brittney Cunningham, is disabled secondary to a fall.  He has difficulty with walking. He used to work for [employer] previously. The patient's Cunningham, Brittney Cunningham, lives in [place], and works for [employer]. Daughter, Brittney Cunningham, lives in [place], and works for [employer]. Daughter, Brittney Cunningham, lives in [place], [place], and works as a Statistician. The patient attends [employer].  ADVANCED DIRECTIVES: Not on file  HEALTH MAINTENANCE: History  Substance Use Topics  . Smoking status: Former Smoker -- 1.00 packs/day for 20 years    Types: Cigarettes    Quit date: 01/31/1988  . Smokeless tobacco: Never Used  . Alcohol Use: No    Colonoscopy: Not on file  PAP: Not on file  Bone density: July 2011, Normal  Lipid panel: 06/26/2012  Allergies  Allergen Reactions  . Chlorhexidine Hives, Itching and Rash    This was most likely a CONTACT DERMATITIS versus true systemic allergic reaction  . Adhesive [Tape] Hives  . Codeine Swelling    Current Outpatient Prescriptions   Medication Sig Dispense Refill  . aspirin 81 MG chewable tablet Chew 81 mg by mouth daily.      Marland Kitchen atorvastatin (LIPITOR) 40 MG tablet Take 1 tablet (40 mg total) by mouth daily.  90 tablet  1  . carvedilol (COREG) 12.5 MG tablet Take 1 tablet (12.5 mg total) by mouth 2 (two) times daily with a meal.  60 tablet  0  . chlorhexidine (PERIDEX) 0.12 % solution Rinse for 90  seconds  with 15 mLs three times daily. Spit out excess. Do NOT swallow.      . clopidogrel (PLAVIX) 75 MG tablet Take 1 tablet (75 mg total) by mouth daily.  30 tablet  6  . cyanocobalamin (,VITAMIN B-12,)  1000 MCG/ML injection Inject 1,000 mcg into the muscle every 30 (thirty) days. Last dose 03/09/2011      . darbepoetin (ARANESP) 200 MCG/0.4ML SOLN Inject 200 mcg into the skin once. Every 2 weeks      . fulvestrant (FASLODEX) 250 MG/5ML injection Inject 500 mg into the muscle every 30 (thirty) days. One injection each buttock over 1-2 minutes. Warm prior to use.      Marland Kitchen glycerin adult (GLYCERIN ADULT) 2 G SUPP Place 1 suppository rectally once as needed (constipation).  10 suppository  0  . hydrochlorothiazide (MICROZIDE) 12.5 MG capsule Take 1 capsule (12.5 mg total) by mouth daily.  30 capsule  3  . ibuprofen (ADVIL) 200 MG tablet Take 200 mg by mouth 3 (three) times daily.        . insulin glargine (LANTUS) 100 UNIT/ML injection Inject 60 Units into the skin at bedtime. Pt wants vials.  30 mL  3  . levothyroxine (SYNTHROID, LEVOTHROID) 200 MCG tablet Take 1 tablet (200 mcg total) by mouth daily before breakfast.  30 tablet  3  . levothyroxine (SYNTHROID, LEVOTHROID) 75 MCG tablet Take 1 tablet (75 mcg total) by mouth daily.  30 tablet  3  . losartan (COZAAR) 50 MG tablet Take 1 tablet (50 mg total) by mouth daily.  30 tablet  5  . metFORMIN (GLUCOPHAGE) 500 MG tablet Take 1 tablet (500 mg total) by mouth every morning.  30 tablet  3  . morphine (MS CONTIN) 30 MG 12 hr tablet Take 1 tablet (30 mg total) by mouth 2 (two) times  daily.  60 tablet  0  . sennosides-docusate sodium (SENOKOT-S) 8.6-50 MG tablet 1 or 2 tabs by mouth daily      . morphine (MSIR) 15 MG tablet Take 1 tablet (15 mg total) by mouth every 4 (four) hours as needed for pain.  60 tablet  0  . [DISCONTINUED] simvastatin (ZOCOR) 40 MG tablet Take 40 mg by mouth every evening.       No current facility-administered medications for this visit.    OBJECTIVE: Elderly white woman who appears comfortable today, and is in no acute distress Filed Vitals:   11/11/12 1124  BP: 111/72  Pulse: 82  Temp: 98.4 F (36.9 C)  Resp: 20     Body mass index is 39.69 kg/(m^2).    ECOG FS: 2 Filed Weights   11/11/12 1124  Weight: 224 lb (101.606 kg)   Physical Exam: HEENT:  Sclerae anicteric.  Oropharynx clear. Poor dentition NODES:  No cervical or supraclavicular lymphadenopathy palpated.  BREAST EXAM:  Status post right mastectomy, no evidence of local recurrence. Left breast is unremarkable. Axillae is benign bilaterally with no palpable adenopathy. LUNGS:  Clear to auscultation bilaterally.  No wheezes or rhonchi HEART:  Regular rate and rhythm.  ABDOMEN:  Soft, nontender.  Positive bowel sounds.  MSK:  No focal spinal tenderness to palpation. Some limited in range of motion of the upper extremities, more so on the right than the left. Nonpitting pedal edema bilaterally in the lower extremities. NEURO:  Nonfocal. Well oriented.  Fatigued affect.    LAB RESULTS: Lab Results  Component Value Date   WBC 6.3 11/11/2012   NEUTROABS 4.1 11/11/2012   HGB 10.7* 11/11/2012   HCT 32.7* 11/11/2012   MCV 90.2 11/11/2012   PLT 237 11/11/2012      Chemistry      Component Value Date/Time   NA 135* 11/11/2012 1053   NA 135  04/13/2012 1909   K 4.8 11/11/2012 1053   K 4.5 04/13/2012 1909   CL 104 07/08/2012 1023   CL 99 04/13/2012 1909   CO2 23 11/11/2012 1053   CO2 24 04/13/2012 1909   BUN 24.9 11/11/2012 1053   BUN 29* 04/13/2012 1909   CREATININE 1.1  11/11/2012 1053   CREATININE 0.90 04/13/2012 1909      Component Value Date/Time   CALCIUM 9.3 11/11/2012 1053   CALCIUM 9.5 04/13/2012 1909   ALKPHOS 165* 11/11/2012 1053   ALKPHOS 126* 06/26/2012 1033   AST 14 11/11/2012 1053   AST 14 06/26/2012 1033   ALT 10 11/11/2012 1053   ALT <8 06/26/2012 1033   BILITOT 0.55 11/11/2012 1053   BILITOT 0.3 06/26/2012 1033      STUDIES:  Nm Pet Image Restag (ps) Skull Base To Thigh  10/15/2012   CLINICAL DATA:  Subsequent treatment strategy for breast cancer. Skeletal metastases.  EXAM: NUCLEAR MEDICINE PET SKULL BASE TO THIGH  FASTING BLOOD GLUCOSE:  Value:  191 mg/dl  TECHNIQUE: 40.9 mCi W-11 FDG was injected intravenously. CT data was obtained and used for attenuation correction and anatomic localization only. (This was not acquired as a diagnostic CT examination.) Additional exam technical data entered on technologist worksheet.  COMPARISON:  Bone scan 11/16/2010, CT scan 04/13/2012  FINDINGS: NECK  No hypermetabolic lymph nodes in the neck.  CHEST  No hypermetabolic mediastinal or hilar nodes. No suspicious pulmonary nodules on the CT scan.  ABDOMEN/PELVIS  No abnormal hypermetabolic activity within the liver, pancreas, adrenal glands, or spleen. No hypermetabolic lymph nodes in the abdomen or pelvis.  SKELETON  There is widespread diffuse hypermetabolic skeletal metastasis. CT portion exam demonstrates extensive lytic and sclerotic lesions through the axillary and appendicular skeleton. The sclerotic lesions on the CT portion are similar to comparison CT. Exemplary lesions include lesion within the proximal left humerus within SUV max 8.3. Right humerus with SUV max = 6.6 . Left iliac bone with SUV max = 6.3 There is metastatic disease within both the left and right proximal femurs.  IMPRESSION: 1. 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  2. No evidence of metastatic disease to the soft tissues of the neck,  chest, abdomen, or pelvis.   Electronically Signed   By: Genevive Bi M.D.   On: 10/15/2012 12:29     ASSESSMENT: 72 y.o. [place] woman:   (1) Status post right breast biopsy in 05/2009 for a grade 2 invasive ductal carcinoma, T2 NX M1, Stage IV, with bone-only involvement, strongly estrogen and progesterone receptor-positive, HER-2-negative with an MIB-1 of 26%.   (2) Neoadjuvantly Brittney Cunningham received Letrozole and zoledronic acid beginning in June of 2011 and underwent right modified radical mastectomy February of 2012 for a ypT2 ypN2, grade 1 invasive ductal carcinoma with negative margins.   (3) Brittney Cunningham completed radiation therapy in August of 2012.   (4) Brittney Cunningham has continued on Letrozole but was switched from Zoledronic acid to Denosumab Rivka Barbara) because of concerns regarding her serum creatinine. Rivka Barbara was being given every 8 weeks.  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  (6) symptomatic anemia, with creatinine clearance < 60 cc/min; Darbepoietin Q14d started 11/03/2011; received Feraheme 01/05/2012  (7) On subcutaneous B-12 supplementation monthly.  (8) Pain in left upper extremity with osseous metastasis, osteoarthritis, tendinopathy, and bursitis as confirmed by recent MRI.  (9) Anemia, multifactorial with renal disease, on Aranesp every 2 weeks.  (10)  letrozole was discontinued in September 2014 with evidence of disease progression. Brittney Cunningham was started on fulvestrant injections, first given on 10/17/2012.    PLAN: Berenice will receive her injections today as scheduled, her final loading dose of fulvestrant, her every 2 week injection of Aranesp, and her monthly injection of vitamin B 12. Brittney Cunningham'll complete her radiation therapy in the next couple of days. Brittney Cunningham will return for labs and Aranesp in 2 weeks, and I will see her a month from now when Brittney Cunningham returns on November 10, due for all 3 injections once again.  I have refilled her long acting morphine  today, 30 mg twice daily. At this time, Brittney Cunningham does not want a refill on the short acting morphine since her pain has improved so significantly. Brittney Cunningham does have some oxycodone/APAP on hand if Brittney Cunningham needs it for breakthrough pain. Brittney Cunningham feels like "this is enough right now."  Our plan was again reviewed with Darel Hong, and Brittney Cunningham voices understanding and agreement. We will follow her with lab work and physical exam as before, restaging her when there is clinical suspicion of progression.  Brittney Cunningham knows as always to call with any changes or problems prior to her next appointment.    Belita Warsame PA-C 11/11/2012  5:20 PM

## 2012-11-12 ENCOUNTER — Ambulatory Visit
Admission: RE | Admit: 2012-11-12 | Discharge: 2012-11-12 | Disposition: A | Discharge status: Home / Self Care | Payer: Medicare Other | Source: Ambulatory Visit | Attending: Radiation Oncology | Admitting: Radiation Oncology

## 2012-11-13 ENCOUNTER — Ambulatory Visit
Admission: RE | Admit: 2012-11-13 | Discharge: 2012-11-13 | Disposition: A | Discharge status: Home / Self Care | Payer: Medicare Other | Source: Ambulatory Visit | Attending: Radiation Oncology | Admitting: Radiation Oncology

## 2012-11-13 ENCOUNTER — Encounter: Payer: Self-pay | Admitting: Radiation Oncology

## 2012-11-13 VITALS — BP 113/70 | HR 70 | Temp 98.4°F | Resp 20 | Wt 224.4 lb

## 2012-11-13 DIAGNOSIS — C7951 Secondary malignant neoplasm of bone: Secondary | ICD-10-CM

## 2012-11-13 NOTE — Progress Notes (Signed)
Department of Radiation Oncology  Phone:  [phone] Fax:        [phone]  Weekly Treatment Note    Name: Brittney Cunningham Date: 11/13/2012 MRN: [id number] DOB: April 03, 1940   Current dose: 30 Gy  Current fraction: 10   MEDICATIONS: Current Outpatient Prescriptions  Medication Sig Dispense Refill  . aspirin 81 MG chewable tablet Chew 81 mg by mouth daily.      Marland Kitchen atorvastatin (LIPITOR) 40 MG tablet Take 1 tablet (40 mg total) by mouth daily.  90 tablet  1  . carvedilol (COREG) 12.5 MG tablet Take 1 tablet (12.5 mg total) by mouth 2 (two) times daily with a meal.  60 tablet  0  . chlorhexidine (PERIDEX) 0.12 % solution Rinse for 90  seconds  with 15 mLs three times daily. Spit out excess. Do NOT swallow.      . clopidogrel (PLAVIX) 75 MG tablet Take 1 tablet (75 mg total) by mouth daily.  30 tablet  6  . cyanocobalamin (,VITAMIN B-12,) 1000 MCG/ML injection Inject 1,000 mcg into the muscle every 30 (thirty) days. Last dose 03/09/2011      . darbepoetin (ARANESP) 200 MCG/0.4ML SOLN Inject 200 mcg into the skin once. Every 2 weeks      . fulvestrant (FASLODEX) 250 MG/5ML injection Inject 500 mg into the muscle every 30 (thirty) days. One injection each buttock over 1-2 minutes. Warm prior to use.      Marland Kitchen glycerin adult (GLYCERIN ADULT) 2 G SUPP Place 1 suppository rectally once as needed (constipation).  10 suppository  0  . hydrochlorothiazide (MICROZIDE) 12.5 MG capsule Take 1 capsule (12.5 mg total) by mouth daily.  30 capsule  3  . ibuprofen (ADVIL) 200 MG tablet Take 200 mg by mouth 3 (three) times daily.        . insulin glargine (LANTUS) 100 UNIT/ML injection Inject 60 Units into the skin at bedtime. Pt wants vials.  30 mL  3  . levothyroxine (SYNTHROID, LEVOTHROID) 200 MCG tablet Take 1 tablet (200 mcg total) by mouth daily before breakfast.  30 tablet  3  . levothyroxine (SYNTHROID, LEVOTHROID) 75 MCG tablet Take 1 tablet (75 mcg total) by mouth daily.  30 tablet  3  .  losartan (COZAAR) 50 MG tablet Take 1 tablet (50 mg total) by mouth daily.  30 tablet  5  . metFORMIN (GLUCOPHAGE) 500 MG tablet Take 1 tablet (500 mg total) by mouth every morning.  30 tablet  3  . morphine (MS CONTIN) 30 MG 12 hr tablet Take 1 tablet (30 mg total) by mouth 2 (two) times daily.  60 tablet  0  . morphine (MSIR) 15 MG tablet Take 1 tablet (15 mg total) by mouth every 4 (four) hours as needed for pain.  60 tablet  0  . sennosides-docusate sodium (SENOKOT-S) 8.6-50 MG tablet 1 or 2 tabs by mouth daily      . [DISCONTINUED] simvastatin (ZOCOR) 40 MG tablet Take 40 mg by mouth every evening.       No current facility-administered medications for this encounter.     ALLERGIES: Chlorhexidine; Adhesive; and Codeine   LABORATORY DATA:  Lab Results  Component Value Date   WBC 6.3 11/11/2012   HGB 10.7* 11/11/2012   HCT 32.7* 11/11/2012   MCV 90.2 11/11/2012   PLT 237 11/11/2012   Lab Results  Component Value Date   NA 135* 11/11/2012   K 4.8 11/11/2012   CL 104 07/08/2012  CO2 23 11/11/2012   Lab Results  Component Value Date   ALT 10 11/11/2012   AST 14 11/11/2012   ALKPHOS 165* 11/11/2012   BILITOT 0.55 11/11/2012     NARRATIVE: Brittney Cunningham was seen today for weekly treatment management. The chart was checked and the patient's films were reviewed. The patient finished her final fraction today. She states that the pain is improved, now really just was some soreness in the right shoulder/arm region. He does still have some limited range of motion with some type of care.  PHYSICAL EXAMINATION: weight is 224 lb 6.4 oz (101.787 kg). Her oral temperature is 98.4 F (36.9 C). Her blood pressure is 113/70 and her pulse is 70. Her respiration is 20.        ASSESSMENT: The patient did satisfactorily with treatment.  PLAN: The patient will  followup in our clinic in one month. She did well with a good palliative response. We discussed some exercises that she can do it  to improve the tightness in her right arm. If this still is a problem for her then she is to let us know and she can be referred to physical therapy.

## 2012-11-13 NOTE — Progress Notes (Signed)
Weekly rad tx,  10/10, rt& lt humerus, right shoulder > left shoulder soreness, no c/o pain now, takes orphine every 12 hours which is helping a lot, has bm's about every 2-3 days, takes stool softner at night, poor appetite, lost 3 lbs since last week some nausea + 10:38 AM

## 2012-11-19 ENCOUNTER — Telehealth: Payer: Self-pay

## 2012-11-19 NOTE — Telephone Encounter (Signed)
Pt left a message stating she would like to know if there are any samples of Lantus.  Pt informed that I have set aside 2 boxes of Lantus in the refrigerator

## 2012-11-21 NOTE — Telephone Encounter (Signed)
error 

## 2012-11-25 ENCOUNTER — Ambulatory Visit: Payer: Medicare Other

## 2012-11-25 ENCOUNTER — Other Ambulatory Visit (HOSPITAL_BASED_OUTPATIENT_CLINIC_OR_DEPARTMENT_OTHER): Payer: Medicare Other | Admitting: Lab

## 2012-11-25 ENCOUNTER — Telehealth: Payer: Self-pay | Admitting: *Deleted

## 2012-11-25 DIAGNOSIS — C50911 Malignant neoplasm of unspecified site of right female breast: Secondary | ICD-10-CM

## 2012-11-25 DIAGNOSIS — C50919 Malignant neoplasm of unspecified site of unspecified female breast: Secondary | ICD-10-CM

## 2012-11-25 DIAGNOSIS — K219 Gastro-esophageal reflux disease without esophagitis: Secondary | ICD-10-CM

## 2012-11-25 DIAGNOSIS — C50619 Malignant neoplasm of axillary tail of unspecified female breast: Secondary | ICD-10-CM

## 2012-11-25 DIAGNOSIS — D649 Anemia, unspecified: Secondary | ICD-10-CM

## 2012-11-25 DIAGNOSIS — C7951 Secondary malignant neoplasm of bone: Secondary | ICD-10-CM

## 2012-11-25 LAB — COMPREHENSIVE METABOLIC PANEL (CC13)
AST: 17 U/L (ref 5–34)
Albumin: 3 g/dL — ABNORMAL LOW (ref 3.5–5.0)
Anion Gap: 9 mEq/L (ref 3–11)
BUN: 24 mg/dL (ref 7.0–26.0)
CO2: 22 mEq/L (ref 22–29)
Calcium: 9.3 mg/dL (ref 8.4–10.4)
Chloride: 106 mEq/L (ref 98–109)
Creatinine: 1.1 mg/dL (ref 0.6–1.1)
Glucose: 201 mg/dl — ABNORMAL HIGH (ref 70–140)
Potassium: 4.6 mEq/L (ref 3.5–5.1)

## 2012-11-25 LAB — CBC WITH DIFFERENTIAL/PLATELET
Basophils Absolute: 0 10*3/uL (ref 0.0–0.1)
EOS%: 3.4 % (ref 0.0–7.0)
Eosinophils Absolute: 0.2 10*3/uL (ref 0.0–0.5)
HCT: 34.4 % — ABNORMAL LOW (ref 34.8–46.6)
HGB: 11.1 g/dL — ABNORMAL LOW (ref 11.6–15.9)
MCH: 28.8 pg (ref 25.1–34.0)
MONO#: 0.5 10*3/uL (ref 0.1–0.9)
NEUT#: 4.4 10*3/uL (ref 1.5–6.5)
NEUT%: 67.5 % (ref 38.4–76.8)
RDW: 15.7 % — ABNORMAL HIGH (ref 11.2–14.5)
lymph#: 1.3 10*3/uL (ref 0.9–3.3)

## 2012-11-25 MED ORDER — DARBEPOETIN ALFA-POLYSORBATE 200 MCG/0.4ML IJ SOLN: 200.0000 ug | Freq: Once | INTRAMUSCULAR | Status: DC

## 2012-11-25 MED ORDER — FULVESTRANT 250 MG/5ML IM SOLN: 500.0000 mg | Freq: Once | INTRAMUSCULAR | Status: DC

## 2012-11-25 NOTE — Telephone Encounter (Signed)
Patient called in to pick up refill, however, pharmacy confirms waiting for patient to pick up Rx from 11/11/12. Patient notified.

## 2012-11-27 ENCOUNTER — Other Ambulatory Visit: Payer: Self-pay

## 2012-11-27 MED ORDER — CARVEDILOL 12.5 MG PO TABS: 12.5000 mg | ORAL_TABLET | Freq: Two times a day (BID) | ORAL | Status: DC

## 2012-11-28 ENCOUNTER — Telehealth: Payer: Self-pay | Admitting: *Deleted

## 2012-11-28 MED ORDER — ATORVASTATIN CALCIUM 40 MG PO TABS: 40.0000 mg | ORAL_TABLET | Freq: Every day | ORAL | Status: DC

## 2012-11-28 NOTE — Telephone Encounter (Signed)
Rx request to pharmacy/[facility]  

## 2012-12-01 NOTE — Progress Notes (Signed)
  Radiation Oncology         [phone] ________________________________  Name: Brittney Cunningham MRN: [id number]  Date: 11/13/2012  DOB: [birth year]  End of Treatment Note  Diagnosis:   Metastatic breast cancer with bony metastasis     Indication for treatment:  Palliative       Radiation treatment dates:   10/31/2012 through 11/13/2012  Site/dose:   The patient was treated to the right and left humerus' bilaterally. She was treated third gray in 10 fractions at 3 gray per fraction.  Narrative: The patient tolerated radiation treatment relatively well.   The patient did well and did not have any difficulties in terms of significant acute toxicity. The patient's pain did decrease during the course of her radiotherapy.  Plan: The patient has completed radiation treatment. The patient will return to radiation oncology clinic for routine followup in one month. I advised the patient to call or return sooner if they have any questions or concerns related to their recovery or treatment. ________________________________  Radene Gunning, M.D., Ph.D.

## 2012-12-09 ENCOUNTER — Other Ambulatory Visit (HOSPITAL_BASED_OUTPATIENT_CLINIC_OR_DEPARTMENT_OTHER): Payer: Medicare Other | Admitting: Lab

## 2012-12-09 ENCOUNTER — Ambulatory Visit (HOSPITAL_BASED_OUTPATIENT_CLINIC_OR_DEPARTMENT_OTHER): Payer: Medicare Other

## 2012-12-09 VITALS — BP 119/49 | HR 66 | Temp 98.0°F

## 2012-12-09 DIAGNOSIS — C50619 Malignant neoplasm of axillary tail of unspecified female breast: Secondary | ICD-10-CM

## 2012-12-09 DIAGNOSIS — D631 Anemia in chronic kidney disease: Secondary | ICD-10-CM

## 2012-12-09 DIAGNOSIS — Z5111 Encounter for antineoplastic chemotherapy: Secondary | ICD-10-CM

## 2012-12-09 DIAGNOSIS — K219 Gastro-esophageal reflux disease without esophagitis: Secondary | ICD-10-CM

## 2012-12-09 DIAGNOSIS — E538 Deficiency of other specified B group vitamins: Secondary | ICD-10-CM

## 2012-12-09 DIAGNOSIS — C50911 Malignant neoplasm of unspecified site of right female breast: Secondary | ICD-10-CM

## 2012-12-09 DIAGNOSIS — D649 Anemia, unspecified: Secondary | ICD-10-CM

## 2012-12-09 DIAGNOSIS — N189 Chronic kidney disease, unspecified: Secondary | ICD-10-CM

## 2012-12-09 DIAGNOSIS — C50919 Malignant neoplasm of unspecified site of unspecified female breast: Secondary | ICD-10-CM

## 2012-12-09 LAB — COMPREHENSIVE METABOLIC PANEL (CC13)
AST: 16 U/L (ref 5–34)
Albumin: 3.2 g/dL — ABNORMAL LOW (ref 3.5–5.0)
Alkaline Phosphatase: 131 U/L (ref 40–150)
Calcium: 9.6 mg/dL (ref 8.4–10.4)
Chloride: 104 mEq/L (ref 98–109)
Glucose: 109 mg/dl (ref 70–140)
Potassium: 4.6 mEq/L (ref 3.5–5.1)
Sodium: 137 mEq/L (ref 136–145)
Total Protein: 6.9 g/dL (ref 6.4–8.3)

## 2012-12-09 LAB — CBC WITH DIFFERENTIAL/PLATELET
Basophils Absolute: 0 10*3/uL (ref 0.0–0.1)
EOS%: 3.7 % (ref 0.0–7.0)
Eosinophils Absolute: 0.2 10*3/uL (ref 0.0–0.5)
HGB: 10.1 g/dL — ABNORMAL LOW (ref 11.6–15.9)
MCV: 90 fL (ref 79.5–101.0)
MONO%: 9.4 % (ref 0.0–14.0)
NEUT#: 2.8 10*3/uL (ref 1.5–6.5)
RBC: 3.48 10*6/uL — ABNORMAL LOW (ref 3.70–5.45)
RDW: 15.5 % — ABNORMAL HIGH (ref 11.2–14.5)
lymph#: 1.4 10*3/uL (ref 0.9–3.3)

## 2012-12-09 MED ORDER — FULVESTRANT 250 MG/5ML IM SOLN
500.0000 mg | Freq: Once | INTRAMUSCULAR | Status: AC
  Administered 2012-12-09: 500 mg via INTRAMUSCULAR
  Filled 2012-12-09: qty 10

## 2012-12-09 MED ORDER — DARBEPOETIN ALFA-POLYSORBATE 200 MCG/0.4ML IJ SOLN
200.0000 ug | Freq: Once | INTRAMUSCULAR | Status: AC
  Administered 2012-12-09: 200 ug via SUBCUTANEOUS
  Filled 2012-12-09: qty 0.4

## 2012-12-09 MED ORDER — CYANOCOBALAMIN 1000 MCG/ML IJ SOLN
1000.0000 ug | Freq: Once | INTRAMUSCULAR | Status: AC
  Administered 2012-12-09: 1000 ug via INTRAMUSCULAR

## 2012-12-11 ENCOUNTER — Other Ambulatory Visit: Payer: Self-pay | Admitting: Lab

## 2012-12-11 ENCOUNTER — Ambulatory Visit: Payer: Self-pay | Admitting: Physician Assistant

## 2012-12-17 ENCOUNTER — Encounter: Payer: Self-pay | Admitting: Radiation Oncology

## 2012-12-19 ENCOUNTER — Telehealth: Payer: Self-pay

## 2012-12-19 ENCOUNTER — Ambulatory Visit
Admission: RE | Admit: 2012-12-19 | Discharge: 2012-12-19 | Disposition: A | Discharge status: Home / Self Care | Payer: Medicare Other | Source: Ambulatory Visit | Attending: Radiation Oncology | Admitting: Radiation Oncology

## 2012-12-19 ENCOUNTER — Telehealth: Payer: Self-pay | Admitting: Oncology

## 2012-12-19 ENCOUNTER — Encounter: Payer: Self-pay | Admitting: Radiation Oncology

## 2012-12-19 VITALS — BP 160/70 | HR 72 | Temp 98.0°F | Resp 20 | Wt 220.4 lb

## 2012-12-19 DIAGNOSIS — C7951 Secondary malignant neoplasm of bone: Secondary | ICD-10-CM

## 2012-12-19 HISTORY — DX: Personal history of irradiation: Z92.3

## 2012-12-19 NOTE — Telephone Encounter (Signed)
pt called to cx appt due to going out of town...will call back to r/s when back in town

## 2012-12-19 NOTE — Progress Notes (Signed)
Radiation Oncology         [phone] ________________________________  Name: Brittney Cunningham MRN: [id number]  Date: 12/19/2012  DOB: 1940/03/13  Follow-Up Visit Note  CC: Danise Edge, MD  Magrinat, Valentino Hue, MD  Diagnosis:   Metastatic breast cancer  Interval Since Last Radiation:  One month   Narrative:  The patient returns today for routine follow-up.  The patient states that she has done well since she finished treatment. She had some skin irritation in the treatment area bilaterally which really began after treatment. Desquamation occurred but this has fully healed. The patient's pain is significantly improved in the shoulder areas. She feels that she has good range of motion remaining.                              ALLERGIES:  is allergic to chlorhexidine; adhesive; and codeine.  Meds: Current Outpatient Prescriptions  Medication Sig Dispense Refill  . aspirin 81 MG chewable tablet Chew 81 mg by mouth daily.      Marland Kitchen atorvastatin (LIPITOR) 40 MG tablet Take 1 tablet (40 mg total) by mouth daily.  90 tablet  0  . carvedilol (COREG) 12.5 MG tablet Take 1 tablet (12.5 mg total) by mouth 2 (two) times daily with a meal.  60 tablet  3  . chlorhexidine (PERIDEX) 0.12 % solution Rinse for 90  seconds  with 15 mLs three times daily. Spit out excess. Do NOT swallow.      . clopidogrel (PLAVIX) 75 MG tablet Take 1 tablet (75 mg total) by mouth daily.  30 tablet  6  . cyanocobalamin (,VITAMIN B-12,) 1000 MCG/ML injection Inject 1,000 mcg into the muscle every 30 (thirty) days. Last dose 03/09/2011      . darbepoetin (ARANESP) 200 MCG/0.4ML SOLN Inject 200 mcg into the skin once. Every 2 weeks      . fulvestrant (FASLODEX) 250 MG/5ML injection Inject 500 mg into the muscle every 30 (thirty) days. One injection each buttock over 1-2 minutes. Warm prior to use.      Marland Kitchen glycerin adult (GLYCERIN ADULT) 2 G SUPP Place 1 suppository rectally once as needed (constipation).  10 suppository  0  .  hydrochlorothiazide (MICROZIDE) 12.5 MG capsule Take 1 capsule (12.5 mg total) by mouth daily.  30 capsule  3  . ibuprofen (ADVIL) 200 MG tablet Take 200 mg by mouth 3 (three) times daily.        . insulin glargine (LANTUS) 100 UNIT/ML injection Inject 60 Units into the skin at bedtime. Pt wants vials.  30 mL  3  . levothyroxine (SYNTHROID, LEVOTHROID) 200 MCG tablet Take 1 tablet (200 mcg total) by mouth daily before breakfast.  30 tablet  3  . levothyroxine (SYNTHROID, LEVOTHROID) 75 MCG tablet Take 75 mcg by mouth daily. MOn Wed & Fridays with the daily      . losartan (COZAAR) 50 MG tablet Take 1 tablet (50 mg total) by mouth daily.  30 tablet  5  . metFORMIN (GLUCOPHAGE) 500 MG tablet Take 1 tablet (500 mg total) by mouth every morning.  30 tablet  3  . morphine (MS CONTIN) 30 MG 12 hr tablet Take 1 tablet (30 mg total) by mouth 2 (two) times daily.  60 tablet  0  . sennosides-docusate sodium (SENOKOT-S) 8.6-50 MG tablet 1 or 2 tabs by mouth daily      . morphine (MSIR) 15 MG tablet Take  1 tablet (15 mg total) by mouth every 4 (four) hours as needed for pain.  60 tablet  0  . [DISCONTINUED] simvastatin (ZOCOR) 40 MG tablet Take 40 mg by mouth every evening.       No current facility-administered medications for this encounter.    Physical Findings: The patient is in no acute distress. Patient is alert and oriented.  weight is 220 lb 6.4 oz (99.973 kg). Her oral temperature is 98 F (36.7 C). Her blood pressure is 160/70 and her pulse is 72. Her respiration is 20. .     Lab Findings: Lab Results  Component Value Date   WBC 4.9 12/09/2012   HGB 10.1* 12/09/2012   HCT 31.3* 12/09/2012   MCV 90.0 12/09/2012   PLT 250 12/09/2012     Radiographic Findings: No results found.  Impression:    The patient had a good palliative response to radiation treatment to the shoulders bilaterally. No ongoing difficulties in terms of short-term toxicity.  Plan:  She will followup on  a when necessary basis.   Radene Gunning, M.D., Ph.D.

## 2012-12-19 NOTE — Progress Notes (Signed)
Follow up b/l  Shoulder rad txs, 10/31/12-11/13/2012, shoulder pain better  Skin under arms peeled  On the 11/22/12, she used silvadene  Which healed since , still has pain there though,pain  A 6 in knees/legs, med list updated, energy level is better, ambulated with cane, slow steady gait Eating okay,

## 2012-12-19 NOTE — Telephone Encounter (Signed)
Patient called asking for samples of Lantus.  I called and informed pt that I have 2 boxes for her. Samples in the fridge

## 2012-12-23 ENCOUNTER — Other Ambulatory Visit: Payer: Self-pay | Admitting: Lab

## 2012-12-23 ENCOUNTER — Ambulatory Visit: Payer: Self-pay | Admitting: Physician Assistant

## 2012-12-23 ENCOUNTER — Ambulatory Visit: Payer: Self-pay

## 2012-12-30 ENCOUNTER — Other Ambulatory Visit: Payer: Self-pay | Admitting: *Deleted

## 2012-12-30 MED ORDER — MORPHINE SULFATE ER 30 MG PO TBCR: 30.0000 mg | EXTENDED_RELEASE_TABLET | Freq: Two times a day (BID) | ORAL | Status: DC

## 2013-01-06 ENCOUNTER — Other Ambulatory Visit (HOSPITAL_BASED_OUTPATIENT_CLINIC_OR_DEPARTMENT_OTHER): Payer: Medicare Other | Admitting: Lab

## 2013-01-06 ENCOUNTER — Ambulatory Visit (HOSPITAL_BASED_OUTPATIENT_CLINIC_OR_DEPARTMENT_OTHER): Payer: Medicare Other

## 2013-01-06 VITALS — BP 131/63 | HR 74 | Temp 98.5°F

## 2013-01-06 DIAGNOSIS — C50911 Malignant neoplasm of unspecified site of right female breast: Secondary | ICD-10-CM

## 2013-01-06 DIAGNOSIS — D631 Anemia in chronic kidney disease: Secondary | ICD-10-CM

## 2013-01-06 DIAGNOSIS — C50919 Malignant neoplasm of unspecified site of unspecified female breast: Secondary | ICD-10-CM

## 2013-01-06 DIAGNOSIS — C50619 Malignant neoplasm of axillary tail of unspecified female breast: Secondary | ICD-10-CM

## 2013-01-06 DIAGNOSIS — Z5111 Encounter for antineoplastic chemotherapy: Secondary | ICD-10-CM

## 2013-01-06 DIAGNOSIS — E538 Deficiency of other specified B group vitamins: Secondary | ICD-10-CM

## 2013-01-06 DIAGNOSIS — N189 Chronic kidney disease, unspecified: Secondary | ICD-10-CM

## 2013-01-06 DIAGNOSIS — K219 Gastro-esophageal reflux disease without esophagitis: Secondary | ICD-10-CM

## 2013-01-06 DIAGNOSIS — D649 Anemia, unspecified: Secondary | ICD-10-CM

## 2013-01-06 LAB — COMPREHENSIVE METABOLIC PANEL (CC13)
ALT: 10 U/L (ref 0–55)
AST: 15 U/L (ref 5–34)
Alkaline Phosphatase: 114 U/L (ref 40–150)
Sodium: 139 mEq/L (ref 136–145)
Total Bilirubin: 0.28 mg/dL (ref 0.20–1.20)
Total Protein: 6.9 g/dL (ref 6.4–8.3)

## 2013-01-06 LAB — CBC WITH DIFFERENTIAL/PLATELET
BASO%: 0.9 % (ref 0.0–2.0)
EOS%: 3.6 % (ref 0.0–7.0)
LYMPH%: 23.4 % (ref 14.0–49.7)
MCHC: 32.3 g/dL (ref 31.5–36.0)
MCV: 90.9 fL (ref 79.5–101.0)
MONO%: 8.4 % (ref 0.0–14.0)
Platelets: 261 10*3/uL (ref 145–400)
RBC: 3.44 10*6/uL — ABNORMAL LOW (ref 3.70–5.45)
RDW: 14.9 % — ABNORMAL HIGH (ref 11.2–14.5)

## 2013-01-06 MED ORDER — FULVESTRANT 250 MG/5ML IM SOLN
500.0000 mg | Freq: Once | INTRAMUSCULAR | Status: AC
  Administered 2013-01-06: 500 mg via INTRAMUSCULAR
  Filled 2013-01-06: qty 10

## 2013-01-06 MED ORDER — CYANOCOBALAMIN 1000 MCG/ML IJ SOLN
1000.0000 ug | Freq: Once | INTRAMUSCULAR | Status: AC
Start: 2013-01-06 — End: 2013-01-06
  Administered 2013-01-06: 1000 ug via INTRAMUSCULAR

## 2013-01-06 MED ORDER — DARBEPOETIN ALFA-POLYSORBATE 200 MCG/0.4ML IJ SOLN
200.0000 ug | Freq: Once | INTRAMUSCULAR | Status: AC
  Administered 2013-01-06: 200 ug via SUBCUTANEOUS
  Filled 2013-01-06: qty 0.4

## 2013-01-28 ENCOUNTER — Encounter: Payer: Self-pay | Admitting: Family Medicine

## 2013-01-28 ENCOUNTER — Ambulatory Visit (INDEPENDENT_AMBULATORY_CARE_PROVIDER_SITE_OTHER): Payer: Medicare Other | Admitting: Family Medicine

## 2013-01-28 VITALS — BP 152/70 | HR 70 | Temp 98.3°F | Ht 63.0 in | Wt 217.0 lb

## 2013-01-28 DIAGNOSIS — E039 Hypothyroidism, unspecified: Secondary | ICD-10-CM

## 2013-01-28 DIAGNOSIS — I10 Essential (primary) hypertension: Secondary | ICD-10-CM

## 2013-01-28 DIAGNOSIS — K219 Gastro-esophageal reflux disease without esophagitis: Secondary | ICD-10-CM

## 2013-01-28 DIAGNOSIS — E785 Hyperlipidemia, unspecified: Secondary | ICD-10-CM

## 2013-01-28 DIAGNOSIS — E119 Type 2 diabetes mellitus without complications: Secondary | ICD-10-CM

## 2013-01-28 NOTE — Patient Instructions (Signed)
4 oz warm prune juice with 2 tbls Probiotic daily such as Digestive Advantage  Cholesterol Cholesterol is a white, waxy, fat-like protein needed by your body in small amounts. The liver makes all the cholesterol you need. It is carried from the liver by the blood through the blood vessels. Deposits (plaque) may build up on blood vessel walls. This makes the arteries narrower and stiffer. Plaque increases the risk for heart attack and stroke. You cannot feel your cholesterol level even if it is very high. The only way to know is by a blood test to check your lipid (fats) levels. Once you know your cholesterol levels, you should keep a record of the test results. Work with your caregiver to to keep your levels in the desired range. WHAT THE RESULTS MEAN:  Total cholesterol is a rough measure of all the cholesterol in your blood.  LDL is the so-called bad cholesterol. This is the type that deposits cholesterol in the walls of the arteries. You want this level to be low.  HDL is the good cholesterol because it cleans the arteries and carries the LDL away. You want this level to be high.  Triglycerides are fat that the body can either burn for energy or store. High levels are closely linked to heart disease. DESIRED LEVELS:  Total cholesterol below 200.  LDL below 100 for people at risk, below 70 for very high risk.  HDL above 50 is good, above 60 is best.  Triglycerides below 150. HOW TO LOWER YOUR CHOLESTEROL:  Diet.  Choose fish or white meat chicken and [place], roasted or baked. Limit fatty cuts of red meat, fried foods, and processed meats, such as sausage and lunch meat.  Eat lots of fresh fruits and vegetables. Choose whole grains, beans, pasta, potatoes and cereals.  Use only small amounts of olive, corn or canola oils. Avoid butter, mayonnaise, shortening or palm kernel oils. Avoid foods with trans-fats.  Use skim/nonfat milk and low-fat/nonfat yogurt and cheeses. Avoid whole  milk, cream, ice cream, egg yolks and cheeses. Healthy desserts include angel food cake, ginger snaps, animal crackers, hard candy, popsicles, and low-fat/nonfat frozen yogurt. Avoid pastries, cakes, pies and cookies.  Exercise.  A regular program helps decrease LDL and raises HDL.  Helps with weight control.  Do things that increase your activity level like gardening, walking, or taking the stairs.  Medication.  May be prescribed by your caregiver to help lowering cholesterol and the risk for heart disease.  You may need medicine even if your levels are normal if you have several risk factors. HOME CARE INSTRUCTIONS   Follow your diet and exercise programs as suggested by your caregiver.  Take medications as directed.  Have blood work done when your caregiver feels it is necessary. MAKE SURE YOU:   Understand these instructions.  Will watch your condition.  Will get help right away if you are not doing well or get worse. Document Released: 10/11/2000 Document Revised: 04/10/2011 Document Reviewed: 04/03/2007 [facility] Patient Information 2014 [place], [place].

## 2013-01-28 NOTE — Progress Notes (Signed)
Pre visit review using our clinic review tool, if applicable. No additional management support is needed unless otherwise documented below in the visit note. 

## 2013-02-03 ENCOUNTER — Other Ambulatory Visit: Payer: Self-pay

## 2013-02-03 ENCOUNTER — Encounter: Payer: Self-pay | Admitting: Family Medicine

## 2013-02-03 ENCOUNTER — Telehealth: Payer: Self-pay | Admitting: Physician Assistant

## 2013-02-03 ENCOUNTER — Ambulatory Visit: Payer: Self-pay

## 2013-02-03 ENCOUNTER — Other Ambulatory Visit: Payer: Self-pay | Admitting: *Deleted

## 2013-02-03 MED ORDER — MORPHINE SULFATE ER 30 MG PO TBCR: 30.0000 mg | EXTENDED_RELEASE_TABLET | Freq: Two times a day (BID) | ORAL | Status: DC

## 2013-02-03 NOTE — Assessment & Plan Note (Signed)
Encouraged DASH diet and continue Losartan

## 2013-02-03 NOTE — Assessment & Plan Note (Signed)
Patient unable to go to lab today but agrees to return in next 2 weeks for hgba1c, minimize simple carbs and continue Lantus daily

## 2013-02-03 NOTE — Progress Notes (Signed)
Patient ID: Brittney Cunningham, female   DOB: 10-22-1940, 73 y.o.   MRN: [id number] DEZHANE STATEN [id number] 16-Apr-1940 02/03/2013      Progress Note-Follow Up  Subjective  Chief Complaint  Chief Complaint  Patient presents with  . Follow-up    HPI  Patient is a 73 year old female who is in today for followup. Overall she's doing well. She did not tolerate atorvastatin as it caused myalgias. No other acute complaints. No chest pain, palpitations, shortness of breath, fevers, headaches, GI or GU concerns at this time. Otherwise taking medications as prescribed.  Past Medical History  Diagnosis Date  . Anemia   . Depression   . Diabetes mellitus type II   . GERD (gastroesophageal reflux disease)   . Hyperlipidemia   . Hypothyroidism   . Osteoarthritis   . Carotid artery stenosis     bilateral  . CAD (coronary artery disease)   . OSA (obstructive sleep apnea)   . Breast cancer     right breast cancer-invasive  ductal carcinoma (StageIV)  . Hypertension   . Anxiety   . Carotid artery occlusion   . Unspecified constipation 03/15/2012  . Bursitis   . Collagen vascular disease   . History of radiation therapy 10/31/12-11/13/12    rt&lt humerous 30Gy/49fx    Past Surgical History  Procedure Laterality Date  . Incisional breast biopsy      remoted left breast biopsy  . Cesarean section    . Other surgical history      GYN surgery  . Knee surgery      right knee surgery  . Carotid endarterectomy      right carotid  . Port-a-cath removal    . Modified mastectomy      Right breast  . Breast surgery    . Mastectomy    . Carotid stent      left    Family History  Problem Relation Age of Onset  . Arthritis    . Coronary artery disease      first degree relative  . Stroke      first degree relative  . Diabetes Mother   . Heart disease Mother   . Hypertension Mother   . Cancer Mother   . Deep vein thrombosis Mother   . Colon cancer Neg Hx   . Stomach cancer Neg Hx    . Cancer Father     Pancreatic cancer  . Breast cancer Paternal Aunt 43    History   Social History  . Marital Status: Married    Spouse Name: N/A    Number of Children: 3  . Years of Education: N/A   Occupational History  . 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 Smoking status: Former Smoker -- 1.00 packs/day for 20 years    Types: Cigarettes    Quit date: 01/31/1988  . Smokeless tobacco: Never Used  . Alcohol Use: No  . Drug Use: No  . Sexual Activity: Not Currently    Birth Control/ Protection: Post-menopausal   Other Topics Concern  . Not on file   Social History Narrative   Retired-ran a daycare facility for 40 years.   Married- 90 years    2 sons    1 daughter - Marine scientist midwife   Former smoker-quit in [place].     Current Outpatient Prescriptions on File Prior to Visit  Medication Sig Dispense Refill  . aspirin 81 MG chewable  tablet Chew 81 mg by mouth daily.      Marland Kitchen atorvastatin (LIPITOR) 40 MG tablet Take 1 tablet (40 mg total) by mouth daily.  90 tablet  0  . carvedilol (COREG) 12.5 MG tablet Take 1 tablet (12.5 mg total) by mouth 2 (two) times daily with a meal.  60 tablet  3  . chlorhexidine (PERIDEX) 0.12 % solution Rinse for 90  seconds  with 15 mLs three times daily. Spit out excess. Do NOT swallow.      . clopidogrel (PLAVIX) 75 MG tablet Take 1 tablet (75 mg total) by mouth daily.  30 tablet  6  . cyanocobalamin (,VITAMIN B-12,) 1000 MCG/ML injection Inject 1,000 mcg into the muscle every 30 (thirty) days. Last dose 03/09/2011      . darbepoetin (ARANESP) 200 MCG/0.4ML SOLN Inject 200 mcg into the skin once. Every 30 days      . fulvestrant (FASLODEX) 250 MG/5ML injection Inject 500 mg into the muscle every 30 (thirty) days. One injection each buttock over 1-2 minutes. Warm prior to use.      Marland Kitchen glycerin adult (GLYCERIN ADULT) 2 G SUPP Place 1 suppository rectally once as needed (constipation).  10 suppository  0  .  hydrochlorothiazide (MICROZIDE) 12.5 MG capsule Take 1 capsule (12.5 mg total) by mouth daily.  30 capsule  3  . ibuprofen (ADVIL) 200 MG tablet Take 200 mg by mouth 3 (three) times daily.        . insulin glargine (LANTUS) 100 UNIT/ML injection Inject 60 Units into the skin at bedtime. Pt wants vials.  30 mL  3  . levothyroxine (SYNTHROID, LEVOTHROID) 200 MCG tablet Take 1 tablet (200 mcg total) by mouth daily before breakfast.  30 tablet  3  . levothyroxine (SYNTHROID, LEVOTHROID) 75 MCG tablet Take 75 mcg by mouth daily. 25mcg MOn Wed & Fridays with the 240mcg daily      . losartan (COZAAR) 50 MG tablet Take 1 tablet (50 mg total) by mouth daily.  30 tablet  5  . metFORMIN (GLUCOPHAGE) 500 MG tablet Take 1 tablet (500 mg total) by mouth every morning.  30 tablet  3  . sennosides-docusate sodium (SENOKOT-S) 8.6-50 MG tablet 1 or 2 tabs by mouth daily      . [DISCONTINUED] simvastatin (ZOCOR) 40 MG tablet Take 40 mg by mouth every evening.       No current facility-administered medications on file prior to visit.    Allergies  Allergen Reactions  . Chlorhexidine Hives, Itching and Rash    This was most likely a CONTACT DERMATITIS versus true systemic allergic reaction  . Adhesive [Tape] Hives  . Codeine Swelling    Review of Systems  Review of Systems  Constitutional: Positive for malaise/fatigue. Negative for fever.  HENT: Negative for congestion.   Eyes: Negative for discharge.  Respiratory: Negative for shortness of breath.   Cardiovascular: Negative for chest pain, palpitations and leg swelling.  Gastrointestinal: Negative for nausea, abdominal pain and diarrhea.  Genitourinary: Negative for dysuria.  Musculoskeletal: Negative for falls.  Skin: Negative for rash.  Neurological: Negative for loss of consciousness and headaches.  Endo/Heme/Allergies: Negative for polydipsia.  Psychiatric/Behavioral: Negative for depression and suicidal ideas. The patient is not nervous/anxious  and does not have insomnia.     Objective  BP 152/70  Pulse 70  Temp(Src) 98.3 F (36.8 C) (Oral)  Ht 5\' 3"  (1.6 m)  Wt 217 lb 0.6 oz (98.449 kg)  BMI 38.46 kg/m2  SpO2  96%  LMP 03/13/2012  Physical Exam  Physical Exam  Constitutional: She is oriented to person, place, and time and well-developed, well-nourished, and in no distress. No distress.  HENT:  Head: Normocephalic and atraumatic.  Eyes: Conjunctivae are normal.  Neck: Neck supple. No thyromegaly present.  Cardiovascular: Normal rate, regular rhythm and normal heart sounds.   No murmur heard. Pulmonary/Chest: Effort normal and breath sounds normal. She has no wheezes.  Abdominal: She exhibits no distension and no mass.  Musculoskeletal: She exhibits no edema.  Lymphadenopathy:    She has no cervical adenopathy.  Neurological: She is alert and oriented to person, place, and time.  Skin: Skin is warm and dry. No rash noted. She is not diaphoretic.  Psychiatric: Memory, affect and judgment normal.    Lab Results  Component Value Date   TSH 0.030* 06/26/2012   Lab Results  Component Value Date   WBC 6.4 01/06/2013   HGB 10.1* 01/06/2013   HCT 31.3* 01/06/2013   MCV 90.9 01/06/2013   PLT 261 01/06/2013   Lab Results  Component Value Date   CREATININE 0.9 01/06/2013   BUN 26.3* 01/06/2013   NA 139 01/06/2013   K 5.3* 01/06/2013   CL 104 07/08/2012   CO2 22 01/06/2013   Lab Results  Component Value Date   ALT 10 01/06/2013   AST 15 01/06/2013   ALKPHOS 114 01/06/2013   BILITOT 0.28 01/06/2013   Lab Results  Component Value Date   CHOL 258* 06/26/2012   Lab Results  Component Value Date   HDL 29* 06/26/2012   Lab Results  Component Value Date   LDLCALC 164* 06/26/2012   Lab Results  Component Value Date   TRIG 326* 06/26/2012   Lab Results  Component Value Date   CHOLHDL 8.9 06/26/2012     Assessment & Plan  DIABETES MELLITUS, TYPE II Patient unable to go to lab today but agrees to return in next 2  weeks for hgba1c, minimize simple carbs and continue Lantus daily  GERD Avoid offending foods, add a probiotic daily   HYPERTENSION Encouraged DASH diet and continue Losartan  HYPERLIPIDEMIA Avoid trans fats, increase exercise. Add krill oil caps, continue Atorvastatin  HYPOTHYROIDISM Continue levothyroxine and check tsh

## 2013-02-03 NOTE — Assessment & Plan Note (Signed)
Continue levothyroxine and check tsh

## 2013-02-03 NOTE — Telephone Encounter (Signed)
Pt called to obtain refill on pain medication.  She states she was scheduled for injections and was planning to obtain at that time but she rescheduled injections due to " stomach virus ".  Prescriptions will be obtained for pt's husband to pick up.

## 2013-02-03 NOTE — Assessment & Plan Note (Addendum)
Avoid trans fats, increase exercise. Add krill oil caps, agrees to restart  Atorvastatinto see if myalgias come back

## 2013-02-03 NOTE — Telephone Encounter (Signed)
Pt called and r/s labs and injection to 02/06/13

## 2013-02-03 NOTE — Assessment & Plan Note (Signed)
Avoid offending foods, add a probiotic daily

## 2013-02-06 ENCOUNTER — Other Ambulatory Visit (HOSPITAL_BASED_OUTPATIENT_CLINIC_OR_DEPARTMENT_OTHER): Payer: Medicare Other

## 2013-02-06 ENCOUNTER — Ambulatory Visit (HOSPITAL_BASED_OUTPATIENT_CLINIC_OR_DEPARTMENT_OTHER): Payer: Medicare Other

## 2013-02-06 VITALS — BP 124/71 | HR 72 | Temp 98.7°F

## 2013-02-06 DIAGNOSIS — C50919 Malignant neoplasm of unspecified site of unspecified female breast: Secondary | ICD-10-CM

## 2013-02-06 DIAGNOSIS — C7951 Secondary malignant neoplasm of bone: Secondary | ICD-10-CM

## 2013-02-06 DIAGNOSIS — C50619 Malignant neoplasm of axillary tail of unspecified female breast: Secondary | ICD-10-CM

## 2013-02-06 DIAGNOSIS — K219 Gastro-esophageal reflux disease without esophagitis: Secondary | ICD-10-CM

## 2013-02-06 DIAGNOSIS — C50911 Malignant neoplasm of unspecified site of right female breast: Secondary | ICD-10-CM

## 2013-02-06 DIAGNOSIS — D649 Anemia, unspecified: Secondary | ICD-10-CM

## 2013-02-06 DIAGNOSIS — N189 Chronic kidney disease, unspecified: Secondary | ICD-10-CM

## 2013-02-06 DIAGNOSIS — C7952 Secondary malignant neoplasm of bone marrow: Secondary | ICD-10-CM

## 2013-02-06 DIAGNOSIS — E538 Deficiency of other specified B group vitamins: Secondary | ICD-10-CM

## 2013-02-06 DIAGNOSIS — D631 Anemia in chronic kidney disease: Secondary | ICD-10-CM

## 2013-02-06 DIAGNOSIS — N039 Chronic nephritic syndrome with unspecified morphologic changes: Secondary | ICD-10-CM

## 2013-02-06 DIAGNOSIS — Z5111 Encounter for antineoplastic chemotherapy: Secondary | ICD-10-CM

## 2013-02-06 LAB — CBC WITH DIFFERENTIAL/PLATELET
BASO%: 0.6 % (ref 0.0–2.0)
BASOS ABS: 0.1 10*3/uL (ref 0.0–0.1)
EOS ABS: 0.4 10*3/uL (ref 0.0–0.5)
EOS%: 4.6 % (ref 0.0–7.0)
HCT: 32 % — ABNORMAL LOW (ref 34.8–46.6)
HEMOGLOBIN: 10.3 g/dL — AB (ref 11.6–15.9)
LYMPH%: 23.8 % (ref 14.0–49.7)
MCH: 28.7 pg (ref 25.1–34.0)
MCHC: 32.3 g/dL (ref 31.5–36.0)
MCV: 89.1 fL (ref 79.5–101.0)
MONO#: 0.8 10*3/uL (ref 0.1–0.9)
MONO%: 9.5 % (ref 0.0–14.0)
NEUT%: 61.5 % (ref 38.4–76.8)
NEUTROS ABS: 4.9 10*3/uL (ref 1.5–6.5)
Platelets: 288 10*3/uL (ref 145–400)
RBC: 3.59 10*6/uL — ABNORMAL LOW (ref 3.70–5.45)
RDW: 14.3 % (ref 11.2–14.5)
WBC: 7.9 10*3/uL (ref 3.9–10.3)
lymph#: 1.9 10*3/uL (ref 0.9–3.3)

## 2013-02-06 LAB — COMPREHENSIVE METABOLIC PANEL (CC13)
ALT: 7 U/L (ref 0–55)
AST: 19 U/L (ref 5–34)
Albumin: 3 g/dL — ABNORMAL LOW (ref 3.5–5.0)
Alkaline Phosphatase: 111 U/L (ref 40–150)
Anion Gap: 11 mEq/L (ref 3–11)
BUN: 33.7 mg/dL — AB (ref 7.0–26.0)
CALCIUM: 9.7 mg/dL (ref 8.4–10.4)
CHLORIDE: 103 meq/L (ref 98–109)
CO2: 22 mEq/L (ref 22–29)
Creatinine: 1.1 mg/dL (ref 0.6–1.1)
Glucose: 71 mg/dl (ref 70–140)
POTASSIUM: 5 meq/L (ref 3.5–5.1)
Sodium: 136 mEq/L (ref 136–145)
Total Bilirubin: 0.35 mg/dL (ref 0.20–1.20)
Total Protein: 7.2 g/dL (ref 6.4–8.3)

## 2013-02-06 MED ORDER — DARBEPOETIN ALFA-POLYSORBATE 200 MCG/0.4ML IJ SOLN
200.0000 ug | Freq: Once | INTRAMUSCULAR | Status: AC
  Administered 2013-02-06: 200 ug via SUBCUTANEOUS
  Filled 2013-02-06: qty 0.4

## 2013-02-06 MED ORDER — CYANOCOBALAMIN 1000 MCG/ML IJ SOLN
1000.0000 ug | Freq: Once | INTRAMUSCULAR | Status: AC
  Administered 2013-02-06: 1000 ug via INTRAMUSCULAR

## 2013-02-06 MED ORDER — FULVESTRANT 250 MG/5ML IM SOLN
500.0000 mg | Freq: Once | INTRAMUSCULAR | Status: AC
  Administered 2013-02-06: 500 mg via INTRAMUSCULAR
  Filled 2013-02-06: qty 10

## 2013-03-03 ENCOUNTER — Other Ambulatory Visit: Payer: Self-pay

## 2013-03-03 ENCOUNTER — Ambulatory Visit: Payer: Self-pay

## 2013-03-03 ENCOUNTER — Telehealth: Payer: Self-pay | Admitting: Oncology

## 2013-03-03 NOTE — Telephone Encounter (Signed)
returned pt call and advised to call back to r/s appt due to being sick

## 2013-03-04 ENCOUNTER — Other Ambulatory Visit (HOSPITAL_BASED_OUTPATIENT_CLINIC_OR_DEPARTMENT_OTHER): Payer: Medicare Other

## 2013-03-04 ENCOUNTER — Ambulatory Visit (HOSPITAL_BASED_OUTPATIENT_CLINIC_OR_DEPARTMENT_OTHER): Payer: Medicare Other

## 2013-03-04 ENCOUNTER — Other Ambulatory Visit: Payer: Self-pay | Admitting: Oncology

## 2013-03-04 VITALS — BP 122/35 | HR 74 | Temp 98.1°F

## 2013-03-04 DIAGNOSIS — C50919 Malignant neoplasm of unspecified site of unspecified female breast: Secondary | ICD-10-CM

## 2013-03-04 DIAGNOSIS — C50619 Malignant neoplasm of axillary tail of unspecified female breast: Secondary | ICD-10-CM

## 2013-03-04 DIAGNOSIS — D649 Anemia, unspecified: Secondary | ICD-10-CM

## 2013-03-04 DIAGNOSIS — C7952 Secondary malignant neoplasm of bone marrow: Secondary | ICD-10-CM

## 2013-03-04 DIAGNOSIS — Z5111 Encounter for antineoplastic chemotherapy: Secondary | ICD-10-CM

## 2013-03-04 DIAGNOSIS — C7951 Secondary malignant neoplasm of bone: Principal | ICD-10-CM

## 2013-03-04 DIAGNOSIS — N289 Disorder of kidney and ureter, unspecified: Secondary | ICD-10-CM

## 2013-03-04 DIAGNOSIS — K219 Gastro-esophageal reflux disease without esophagitis: Secondary | ICD-10-CM

## 2013-03-04 DIAGNOSIS — E538 Deficiency of other specified B group vitamins: Secondary | ICD-10-CM

## 2013-03-04 DIAGNOSIS — C50911 Malignant neoplasm of unspecified site of right female breast: Secondary | ICD-10-CM

## 2013-03-04 LAB — CBC WITH DIFFERENTIAL/PLATELET
BASO%: 1 % (ref 0.0–2.0)
Basophils Absolute: 0.1 10*3/uL (ref 0.0–0.1)
EOS%: 4.1 % (ref 0.0–7.0)
Eosinophils Absolute: 0.2 10*3/uL (ref 0.0–0.5)
HCT: 29.6 % — ABNORMAL LOW (ref 34.8–46.6)
HGB: 9.6 g/dL — ABNORMAL LOW (ref 11.6–15.9)
LYMPH%: 30.5 % (ref 14.0–49.7)
MCH: 29.3 pg (ref 25.1–34.0)
MCHC: 32.3 g/dL (ref 31.5–36.0)
MCV: 90.6 fL (ref 79.5–101.0)
MONO#: 0.5 10*3/uL (ref 0.1–0.9)
MONO%: 9.2 % (ref 0.0–14.0)
NEUT#: 3.2 10*3/uL (ref 1.5–6.5)
NEUT%: 55.2 % (ref 38.4–76.8)
Platelets: 226 10*3/uL (ref 145–400)
RBC: 3.26 10*6/uL — ABNORMAL LOW (ref 3.70–5.45)
RDW: 15.4 % — ABNORMAL HIGH (ref 11.2–14.5)
WBC: 5.7 10*3/uL (ref 3.9–10.3)
lymph#: 1.7 10*3/uL (ref 0.9–3.3)

## 2013-03-04 LAB — COMPREHENSIVE METABOLIC PANEL (CC13)
ALT: 8 U/L (ref 0–55)
ANION GAP: 9 meq/L (ref 3–11)
AST: 14 U/L (ref 5–34)
Albumin: 3.1 g/dL — ABNORMAL LOW (ref 3.5–5.0)
Alkaline Phosphatase: 120 U/L (ref 40–150)
BILIRUBIN TOTAL: 0.32 mg/dL (ref 0.20–1.20)
BUN: 31.9 mg/dL — AB (ref 7.0–26.0)
CO2: 23 mEq/L (ref 22–29)
Calcium: 9.5 mg/dL (ref 8.4–10.4)
Chloride: 106 mEq/L (ref 98–109)
Creatinine: 1 mg/dL (ref 0.6–1.1)
GLUCOSE: 136 mg/dL (ref 70–140)
POTASSIUM: 5.1 meq/L (ref 3.5–5.1)
SODIUM: 139 meq/L (ref 136–145)
TOTAL PROTEIN: 6.5 g/dL (ref 6.4–8.3)

## 2013-03-04 MED ORDER — CYANOCOBALAMIN 1000 MCG/ML IJ SOLN
1000.0000 ug | Freq: Once | INTRAMUSCULAR | Status: AC
  Administered 2013-03-04: 1000 ug via INTRAMUSCULAR

## 2013-03-04 MED ORDER — FULVESTRANT 250 MG/5ML IM SOLN
500.0000 mg | Freq: Once | INTRAMUSCULAR | Status: AC
  Administered 2013-03-04: 500 mg via INTRAMUSCULAR
  Filled 2013-03-04: qty 10

## 2013-03-04 MED ORDER — DARBEPOETIN ALFA-POLYSORBATE 200 MCG/0.4ML IJ SOLN
200.0000 ug | Freq: Once | INTRAMUSCULAR | Status: AC
  Administered 2013-03-04: 200 ug via SUBCUTANEOUS
  Filled 2013-03-04: qty 0.4

## 2013-03-05 ENCOUNTER — Other Ambulatory Visit: Payer: Self-pay | Admitting: *Deleted

## 2013-03-05 MED ORDER — MORPHINE SULFATE ER 30 MG PO TBCR: 30.0000 mg | EXTENDED_RELEASE_TABLET | Freq: Two times a day (BID) | ORAL | Status: DC

## 2013-03-06 ENCOUNTER — Telehealth: Payer: Self-pay | Admitting: Family Medicine

## 2013-03-06 ENCOUNTER — Other Ambulatory Visit: Payer: Self-pay | Admitting: *Deleted

## 2013-03-06 MED ORDER — CLOPIDOGREL BISULFATE 75 MG PO TABS: 75.0000 mg | ORAL_TABLET | Freq: Every day | ORAL | Status: DC

## 2013-03-06 MED ORDER — HYDROCHLOROTHIAZIDE 12.5 MG PO CAPS: 12.5000 mg | ORAL_CAPSULE | Freq: Every day | ORAL | Status: DC

## 2013-03-06 NOTE — Telephone Encounter (Signed)
Refill- hydrochlorothiazide °

## 2013-03-10 ENCOUNTER — Ambulatory Visit (HOSPITAL_COMMUNITY): Payer: Self-pay | Admitting: Dentistry

## 2013-03-11 ENCOUNTER — Other Ambulatory Visit: Payer: Self-pay | Admitting: Oncology

## 2013-03-11 DIAGNOSIS — M358 Other specified systemic involvement of connective tissue: Secondary | ICD-10-CM | POA: Insufficient documentation

## 2013-03-11 DIAGNOSIS — M3589 Other specified systemic involvement of connective tissue: Secondary | ICD-10-CM

## 2013-03-18 ENCOUNTER — Ambulatory Visit (HOSPITAL_COMMUNITY): Payer: Self-pay | Admitting: Dentistry

## 2013-03-20 ENCOUNTER — Other Ambulatory Visit: Payer: Self-pay | Admitting: Surgery

## 2013-03-20 DIAGNOSIS — I6529 Occlusion and stenosis of unspecified carotid artery: Secondary | ICD-10-CM

## 2013-03-24 ENCOUNTER — Telehealth: Payer: Self-pay

## 2013-03-24 NOTE — Telephone Encounter (Signed)
Patient left a message stating she needs more test strips sent to pharmacy?

## 2013-03-24 NOTE — Telephone Encounter (Signed)
I spoke to the patient and she is going to call the insurance company and get the name of the meter and test strips that they will cover and call us back

## 2013-03-26 ENCOUNTER — Other Ambulatory Visit (HOSPITAL_COMMUNITY): Payer: Self-pay | Admitting: Dentistry

## 2013-03-26 ENCOUNTER — Ambulatory Visit (HOSPITAL_COMMUNITY): Payer: Self-pay | Admitting: Dentistry

## 2013-03-26 ENCOUNTER — Telehealth: Payer: Self-pay | Admitting: Family Medicine

## 2013-03-26 DIAGNOSIS — M272 Inflammatory conditions of jaws: Secondary | ICD-10-CM

## 2013-03-26 DIAGNOSIS — Y842 Radiological procedure and radiotherapy as the cause of abnormal reaction of the patient, or of later complication, without mention of misadventure at the time of the procedure: Principal | ICD-10-CM

## 2013-03-26 MED ORDER — LEVOTHYROXINE SODIUM 75 MCG PO TABS: 75.0000 ug | ORAL_TABLET | Freq: Every day | ORAL | Status: DC

## 2013-03-26 MED ORDER — CHLORHEXIDINE GLUCONATE 0.12 % MT SOLN: OROMUCOSAL | Status: DC

## 2013-03-26 MED ORDER — LEVOTHYROXINE SODIUM 200 MCG PO TABS: 200.0000 ug | ORAL_TABLET | Freq: Every day | ORAL | Status: DC

## 2013-03-26 NOTE — Telephone Encounter (Signed)
Refill levothyroxine

## 2013-03-28 ENCOUNTER — Telehealth: Payer: Self-pay | Admitting: *Deleted

## 2013-03-28 ENCOUNTER — Telehealth: Payer: Self-pay | Admitting: Family Medicine

## 2013-03-28 MED ORDER — GLUCOSE BLOOD VI STRP: ORAL_STRIP | Status: DC

## 2013-03-28 MED ORDER — ACCU-CHEK NANO SMARTVIEW W/DEVICE KIT: PACK | Status: DC

## 2013-03-28 NOTE — Telephone Encounter (Signed)
Rxs sent

## 2013-03-28 NOTE — Telephone Encounter (Signed)
Received call from Tibbie wanting to verify pt's levothyroxine 29mcg dose.  Verified per 05/2012 lab note that 34mcg should be taken on Mon, Wed and Frid. Med list updated.

## 2013-03-28 NOTE — Telephone Encounter (Signed)
Opened in error

## 2013-03-28 NOTE — Telephone Encounter (Signed)
Patient states that she is using the nano meter and accu-chek test strips and please send rx to [facility]

## 2013-03-28 NOTE — Addendum Note (Signed)
Addended by: Kelle Darting A on: 03/28/2013 04:24 PM   Modules accepted: Orders

## 2013-04-03 ENCOUNTER — Telehealth: Payer: Self-pay | Admitting: Family Medicine

## 2013-04-03 MED ORDER — METFORMIN HCL 500 MG PO TABS: 500.0000 mg | ORAL_TABLET | Freq: Every morning | ORAL | Status: DC

## 2013-04-03 MED ORDER — LOSARTAN POTASSIUM 50 MG PO TABS: 50.0000 mg | ORAL_TABLET | Freq: Every day | ORAL | Status: DC

## 2013-04-03 NOTE — Telephone Encounter (Signed)
Refill- metformin and losartan

## 2013-04-04 ENCOUNTER — Telehealth: Payer: Self-pay | Admitting: Family Medicine

## 2013-04-04 ENCOUNTER — Other Ambulatory Visit: Payer: Self-pay | Admitting: *Deleted

## 2013-04-04 DIAGNOSIS — E119 Type 2 diabetes mellitus without complications: Secondary | ICD-10-CM

## 2013-04-04 MED ORDER — MORPHINE SULFATE ER 30 MG PO TBCR: 30.0000 mg | EXTENDED_RELEASE_TABLET | Freq: Two times a day (BID) | ORAL | Status: DC

## 2013-04-04 MED ORDER — CARVEDILOL 12.5 MG PO TABS: 12.5000 mg | ORAL_TABLET | Freq: Two times a day (BID) | ORAL | Status: DC

## 2013-04-04 MED ORDER — INSULIN GLARGINE 100 UNIT/ML ~~LOC~~ SOLN: 60.0000 [IU] | Freq: Every day | SUBCUTANEOUS | Status: DC

## 2013-04-04 NOTE — Telephone Encounter (Signed)
OK to refill Carvedilol and Lantus 30 day supply with 5 rf or 90 day supply with 1 refill

## 2013-04-04 NOTE — Telephone Encounter (Signed)
These were already done this morning

## 2013-04-04 NOTE — Telephone Encounter (Signed)
Requesting refill on Lantus 60 units at bedtime   Cardedilol 12.5mg  1 tab 2x daily

## 2013-04-07 ENCOUNTER — Other Ambulatory Visit (HOSPITAL_BASED_OUTPATIENT_CLINIC_OR_DEPARTMENT_OTHER): Payer: Medicare Other

## 2013-04-07 ENCOUNTER — Telehealth: Payer: Self-pay | Admitting: Oncology

## 2013-04-07 ENCOUNTER — Ambulatory Visit (HOSPITAL_BASED_OUTPATIENT_CLINIC_OR_DEPARTMENT_OTHER): Payer: Medicare Other

## 2013-04-07 VITALS — BP 110/85 | HR 69 | Temp 98.4°F

## 2013-04-07 DIAGNOSIS — C7952 Secondary malignant neoplasm of bone marrow: Secondary | ICD-10-CM

## 2013-04-07 DIAGNOSIS — C50919 Malignant neoplasm of unspecified site of unspecified female breast: Secondary | ICD-10-CM

## 2013-04-07 DIAGNOSIS — C7951 Secondary malignant neoplasm of bone: Secondary | ICD-10-CM

## 2013-04-07 DIAGNOSIS — N289 Disorder of kidney and ureter, unspecified: Secondary | ICD-10-CM

## 2013-04-07 DIAGNOSIS — K219 Gastro-esophageal reflux disease without esophagitis: Secondary | ICD-10-CM

## 2013-04-07 DIAGNOSIS — Z5111 Encounter for antineoplastic chemotherapy: Secondary | ICD-10-CM

## 2013-04-07 DIAGNOSIS — D649 Anemia, unspecified: Secondary | ICD-10-CM

## 2013-04-07 DIAGNOSIS — N189 Chronic kidney disease, unspecified: Secondary | ICD-10-CM

## 2013-04-07 DIAGNOSIS — C50619 Malignant neoplasm of axillary tail of unspecified female breast: Secondary | ICD-10-CM

## 2013-04-07 DIAGNOSIS — C50911 Malignant neoplasm of unspecified site of right female breast: Secondary | ICD-10-CM

## 2013-04-07 DIAGNOSIS — E538 Deficiency of other specified B group vitamins: Secondary | ICD-10-CM

## 2013-04-07 DIAGNOSIS — D631 Anemia in chronic kidney disease: Secondary | ICD-10-CM

## 2013-04-07 DIAGNOSIS — N039 Chronic nephritic syndrome with unspecified morphologic changes: Secondary | ICD-10-CM

## 2013-04-07 LAB — CBC WITH DIFFERENTIAL/PLATELET
BASO%: 0.8 % (ref 0.0–2.0)
BASOS ABS: 0 10*3/uL (ref 0.0–0.1)
EOS%: 4.1 % (ref 0.0–7.0)
Eosinophils Absolute: 0.2 10*3/uL (ref 0.0–0.5)
HEMATOCRIT: 30.5 % — AB (ref 34.8–46.6)
HEMOGLOBIN: 9.9 g/dL — AB (ref 11.6–15.9)
LYMPH#: 1.7 10*3/uL (ref 0.9–3.3)
LYMPH%: 27.5 % (ref 14.0–49.7)
MCH: 29.3 pg (ref 25.1–34.0)
MCHC: 32.5 g/dL (ref 31.5–36.0)
MCV: 90.3 fL (ref 79.5–101.0)
MONO#: 0.5 10*3/uL (ref 0.1–0.9)
MONO%: 9.1 % (ref 0.0–14.0)
NEUT%: 58.5 % (ref 38.4–76.8)
NEUTROS ABS: 3.5 10*3/uL (ref 1.5–6.5)
Platelets: 242 10*3/uL (ref 145–400)
RBC: 3.38 10*6/uL — ABNORMAL LOW (ref 3.70–5.45)
RDW: 16.3 % — ABNORMAL HIGH (ref 11.2–14.5)
WBC: 6 10*3/uL (ref 3.9–10.3)

## 2013-04-07 LAB — COMPREHENSIVE METABOLIC PANEL (CC13)
ALBUMIN: 3.4 g/dL — AB (ref 3.5–5.0)
ALT: 11 U/L (ref 0–55)
ANION GAP: 10 meq/L (ref 3–11)
AST: 16 U/L (ref 5–34)
Alkaline Phosphatase: 107 U/L (ref 40–150)
BUN: 29.2 mg/dL — ABNORMAL HIGH (ref 7.0–26.0)
CALCIUM: 9.7 mg/dL (ref 8.4–10.4)
CHLORIDE: 108 meq/L (ref 98–109)
CO2: 22 meq/L (ref 22–29)
CREATININE: 1 mg/dL (ref 0.6–1.1)
Glucose: 114 mg/dl (ref 70–140)
POTASSIUM: 4.9 meq/L (ref 3.5–5.1)
Sodium: 139 mEq/L (ref 136–145)
Total Bilirubin: 0.35 mg/dL (ref 0.20–1.20)
Total Protein: 7 g/dL (ref 6.4–8.3)

## 2013-04-07 MED ORDER — FULVESTRANT 250 MG/5ML IM SOLN
500.0000 mg | Freq: Once | INTRAMUSCULAR | Status: AC
  Administered 2013-04-07: 500 mg via INTRAMUSCULAR
  Filled 2013-04-07: qty 10

## 2013-04-07 MED ORDER — DARBEPOETIN ALFA-POLYSORBATE 200 MCG/0.4ML IJ SOLN
200.0000 ug | Freq: Once | INTRAMUSCULAR | Status: AC
  Administered 2013-04-07: 200 ug via SUBCUTANEOUS
  Filled 2013-04-07: qty 0.4

## 2013-04-07 MED ORDER — CYANOCOBALAMIN 1000 MCG/ML IJ SOLN
1000.0000 ug | Freq: Once | INTRAMUSCULAR | Status: AC
  Administered 2013-04-07: 1000 ug via INTRAMUSCULAR

## 2013-04-07 NOTE — Telephone Encounter (Signed)
pt stopped by after inj to r/s nov appt that she cx'd due to having to go  out of town. pt gviven new appt and schedule for 3/25.

## 2013-04-07 NOTE — Patient Instructions (Signed)
Vitamin B12 Injections Every person needs vitamin B12. A deficiency develops when the body does not get enough of it. One way to overcome this is by getting B12 shots (injections). A B12 shot puts the vitamin directly into muscle tissue. This avoids any problems your body might have in absorbing it from food or a pill. In some people, the body has trouble using the vitamin correctly. This can cause a B12 deficiency. Not consuming enough of the vitamin can also cause a deficiency. Getting enough vitamin B12 can be hard for elderly people. Sometimes, they do not eat a well-balanced diet. The elderly are also more likely than younger people to have medical conditions or take medications that can lead to a deficiency. WHAT DOES VITAMIN B12 DO? Vitamin B12 does many things to help the body work right:  It helps the body make healthy red blood cells.  It helps maintain nerve cells.  It is involved in the body's process of converting food into energy (metabolism).  It is needed to make the genetic material in all cells (DNA). VITAMIN B12 FOOD SOURCES Most people get plenty of vitamin B12 through the foods they eat. It is present in:  Meat, fish, poultry, and eggs.  Milk and milk products.  It also is added when certain foods are made, including some breads, cereals and yogurts. The food is then called "fortified". CAUSES The most common causes of vitamin B12 deficiency are:  Pernicious anemia. The condition develops when the body cannot make enough healthy red blood cells. This stems from a lack of a protein made in the stomach (intrinsic factor). People without this protein cannot absorb enough vitamin B12 from food.  Malabsorption. This is when the body cannot absorb the vitamin. It can be caused by:  Pernicious anemia.  Surgery to remove part or all of the stomach can lead to malabsorption. Removal of part or all of the small intestine can also cause malabsorption.  Vegetarian diet.  People who are strict about not eating foods from animals could have trouble taking in enough vitamin B12 from diet alone.  Medications. Some medicines have been linked to B12 deficiency, such as Metformin (a drug prescribed for type 2 diabetes). Long-term use of stomach acid suppressants also can keep the vitamin from being absorbed.  Intestinal problems such as inflammatory bowel disease. If there are problems in the digestive tract, vitamin B12 may not be absorbed in good enough amounts. SYMPTOMS People who do not get enough B12 can develop problems. These can include:  Anemia. This is when the body has too few red blood cells. Red blood cells carry oxygen to the rest of the body. Without a healthy supply of red blood cells, people can feel:  Tired (fatigued).  Weak.  Severe anemia can cause:  Shortness of breath.  Dizziness.  Rapid heart rate.  Paleness.  Other Vitamin B12 deficiency symptoms include:  Diarrhea.  Numbness or tingling in the hands or feet.  Loss of appetite.  Confusion.  Sores on the tongue or in the mouth. LET YOUR CAREGIVER KNOW ABOUT:  Any allergies. It is very important to know if you are allergic or sensitive to cobalt. Vitamin B12 contains cobalt.  Any history of kidney disease.  All medications you are taking. Include prescription and over-the-counter medicines, herbs and creams.  Whether you are pregnant or breast-feeding.  If you have Leber's disease, a hereditary eye condition, vitamin B12 could make it worse. RISKS AND COMPLICATIONS Reactions to an injection are   usually temporary. They might include:  Pain at the injection site.  Redness, swelling or tenderness at the site.  Headache, dizziness or weakness.  Nausea, upset stomach or diarrhea.  Numbness or tingling.  Fever.  Joint pain.  Itching or rash. If a reaction does not go away in a short while, talk with your healthcare provider. A change in the way the shots are  given, or where they are given, might need to be made. BEFORE AN INJECTION To decide whether B12 injections are right for you, your healthcare provider will probably:  Ask about your medical history.  Ask questions about your diet.  Ask about symptoms such as:  Have you felt weak?  Do you feel unusually tired?  Do you get dizzy?  Order blood tests. These may include a test to:  Check the level of red cells in your blood.  Measure B12 levels.  Check for the presence of intrinsic factor. VITAMIN B12 INJECTIONS How often you will need a vitamin B12 injection will depend on how severe your deficiency is. This also will affect how long you will need to get them. People with pernicious anemia usually get injections for their entire life. Others might get them for a shorter period. For many people, injections are given daily or weekly for several weeks. Then, once B12 levels are normal, injections are given just once a month. If the cause of the deficiency can be fixed, the injections can be stopped. Talk with your healthcare provider about what you should expect. For an injection:  The injection site will be cleaned with an alcohol swab.  Your healthcare provider will insert a needle directly into a muscle. Most any muscle can be used. Most often, an arm muscle is used. A buttocks muscle can also be used. Many people say shots in that area are less painful.  A small adhesive bandage may be put over the injection site. It usually can be taken off in an hour or less. Injections can be given by your healthcare provider. In some cases, family members give them. Sometimes, people give them to themselves. Talk with your healthcare provider about what would be best for you. If someone other than your healthcare provider will be giving the shots, the person will need to be trained to give them correctly. HOME CARE INSTRUCTIONS   You can remove the adhesive bandage within an hour of getting a  shot.  You should be able to go about your normal activities right away.  Avoid drinking large amounts of alcohol while taking vitamin B12 shots. Alcohol can interfere with the body's use of the vitamin. SEEK MEDICAL CARE IF:   Pain, redness, swelling or tenderness at the injection site does not get better or gets worse.  Headache, dizziness or weakness does not go away.  You develop a fever of more than 100.5 F (38.1 C). SEEK IMMEDIATE MEDICAL CARE IF:   You have chest pain.  You develop shortness of breath.  You have muscle weakness that gets worse.  You develop numbness, weakness or tingling on one side or one area of the body.  You have symptoms of an allergic reaction, such as:  Hives.  Difficulty breathing.  Swelling of the lips, face, tongue or throat.  You develop a fever of more than 102.0 F (38.9 C). MAKE SURE YOU:   Understand these instructions.  Will watch your condition.  Will get help right away if you are not doing well or get worse. Document   Released: 04/14/2008 Document Revised: 04/10/2011 Document Reviewed: 04/14/2008 [facility] Patient Information 2014 [place], [place].   Fulvestrant injection What is this medicine? FULVESTRANT (ful VES trant) blocks the effects of estrogen. It is used to treat breast cancer in women past the age of menopause. This medicine may be used for other purposes; ask your health care provider or pharmacist if you have questions. COMMON BRAND NAME(S): FASLODEX What should I tell my health care provider before I take this medicine? They need to know if you have any of these conditions: -bleeding problems -liver disease -low levels of platelets in the blood -an unusual or allergic reaction to fulvestrant, other medicines, foods, dyes, or preservatives -pregnant or trying to get pregnant -breast-feeding How should I use this medicine? This medicine is for injection into a muscle. It is usually given by a health care  professional in a hospital or clinic setting. Talk to your pediatrician regarding the use of this medicine in children. Special care may be needed. Overdosage: If you think you have taken too much of this medicine contact a poison control center or emergency room at once. NOTE: This medicine is only for you. Do not share this medicine with others. What if I miss a dose? It is important not to miss your dose. Call your doctor or health care professional if you are unable to keep an appointment. What may interact with this medicine? -medicines that treat or prevent blood clots like warfarin, enoxaparin, and dalteparin This list may not describe all possible interactions. Give your health care provider a list of all the medicines, herbs, non-prescription drugs, or dietary supplements you use. Also tell them if you smoke, drink alcohol, or use illegal drugs. Some items may interact with your medicine. What should I watch for while using this medicine? Your condition will be monitored carefully while you are receiving this medicine. You will need important blood work done while you are taking this medicine. Do not become pregnant while taking this medicine. Women should inform their doctor if they wish to become pregnant or think they might be pregnant. There is a potential for serious side effects to an unborn child. Talk to your health care professional or pharmacist for more information. What side effects may I notice from receiving this medicine? Side effects that you should report to your doctor or health care professional as soon as possible: -allergic reactions like skin rash, itching or hives, swelling of the face, lips, or tongue -feeling faint or lightheaded, falls -fever or flu-like symptoms -sore throat -vaginal bleeding Side effects that usually do not require medical attention (report to your doctor or health care professional if they continue or are bothersome): -aches,  pains -constipation or diarrhea -headache -hot flashes -nausea, vomiting -pain at site where injected -stomach pain This list may not describe all possible side effects. Call your doctor for medical advice about side effects. You may report side effects to FDA at 1-800-FDA-1088. Where should I keep my medicine? This drug is given in a hospital or clinic and will not be stored at home. NOTE: This sheet is a summary. It may not cover all possible information. If you have questions about this medicine, talk to your doctor, pharmacist, or health care provider.  2014, [employer]. (2007-05-27 15:39:24)   Darbepoetin Alfa injection What is this medicine? DARBEPOETIN ALFA (dar be POE e tin AL fa) helps your body make more red blood cells. It is used to treat anemia caused by chronic kidney failure and chemotherapy.  This medicine may be used for other purposes; ask your health care provider or pharmacist if you have questions. COMMON BRAND NAME(S): Aranesp What should I tell my health care provider before I take this medicine? They need to know if you have any of these conditions: -blood clotting disorders or history of blood clots -cancer patient not on chemotherapy -cystic fibrosis -heart disease, such as angina, heart failure, or a history of a heart attack -hemoglobin level of 12 g/dL or greater -high blood pressure -low levels of folate, iron, or vitamin B12 -seizures -an unusual or allergic reaction to darbepoetin, erythropoietin, albumin, hamster proteins, latex, other medicines, foods, dyes, or preservatives -pregnant or trying to get pregnant -breast-feeding How should I use this medicine? This medicine is for injection into a vein or under the skin. It is usually given by a health care professional in a hospital or clinic setting. If you get this medicine at home, you will be taught how to prepare and give this medicine. Do not shake the solution before you withdraw a  dose. Use exactly as directed. Take your medicine at regular intervals. Do not take your medicine more often than directed. It is important that you put your used needles and syringes in a special sharps container. Do not put them in a trash can. If you do not have a sharps container, call your pharmacist or healthcare provider to get one. Talk to your pediatrician regarding the use of this medicine in children. While this medicine may be used in children as young as 1 year for selected conditions, precautions do apply. Overdosage: If you think you have taken too much of this medicine contact a poison control center or emergency room at once. NOTE: This medicine is only for you. Do not share this medicine with others. What if I miss a dose? If you miss a dose, take it as soon as you can. If it is almost time for your next dose, take only that dose. Do not take double or extra doses. What may interact with this medicine? Do not take this medicine with any of the following medications: -epoetin alfa This list may not describe all possible interactions. Give your health care provider a list of all the medicines, herbs, non-prescription drugs, or dietary supplements you use. Also tell them if you smoke, drink alcohol, or use illegal drugs. Some items may interact with your medicine. What should I watch for while using this medicine? Visit your prescriber or health care professional for regular checks on your progress and for the needed blood tests and blood pressure measurements. It is especially important for the doctor to make sure your hemoglobin level is in the desired range, to limit the risk of potential side effects and to give you the best benefit. Keep all appointments for any recommended tests. Check your blood pressure as directed. Ask your doctor what your blood pressure should be and when you should contact him or her. As your body makes more red blood cells, you may need to take iron, folic  acid, or vitamin B supplements. Ask your doctor or health care provider which products are right for you. If you have kidney disease continue dietary restrictions, even though this medication can make you feel better. Talk with your doctor or health care professional about the foods you eat and the vitamins that you take. What side effects may I notice from receiving this medicine? Side effects that you should report to your doctor or health care professional  as soon as possible: -allergic reactions like skin rash, itching or hives, swelling of the face, lips, or tongue -breathing problems -changes in vision -chest pain -confusion, trouble speaking or understanding -feeling faint or lightheaded, falls -high blood pressure -muscle aches or pains -pain, swelling, warmth in the leg -rapid weight gain -severe headaches -sudden numbness or weakness of the face, arm or leg -trouble walking, dizziness, loss of balance or coordination -seizures (convulsions) -swelling of the ankles, feet, hands -unusually weak or tired Side effects that usually do not require medical attention (report to your doctor or health care professional if they continue or are bothersome): -diarrhea -fever, chills (flu-like symptoms) -headaches -nausea, vomiting -redness, stinging, or swelling at site where injected This list may not describe all possible side effects. Call your doctor for medical advice about side effects. You may report side effects to FDA at 1-800-FDA-1088. Where should I keep my medicine? Keep out of the reach of children. Store in a refrigerator between 2 and 8 degrees C (36 and 46 degrees F). Do not freeze. Do not shake. Throw away any unused portion if using a single-dose vial. Throw away any unused medicine after the expiration date. NOTE: This sheet is a summary. It may not cover all possible information. If you have questions about this medicine, talk to your doctor, pharmacist, or health care  provider.  2014, [employer] Standard. (2007-12-31 10:23:57)

## 2013-04-09 ENCOUNTER — Other Ambulatory Visit: Payer: Self-pay

## 2013-04-09 MED ORDER — CARVEDILOL 12.5 MG PO TABS: 12.5000 mg | ORAL_TABLET | Freq: Two times a day (BID) | ORAL | Status: DC

## 2013-04-23 ENCOUNTER — Encounter: Payer: Self-pay | Admitting: Physician Assistant

## 2013-04-23 ENCOUNTER — Ambulatory Visit (HOSPITAL_BASED_OUTPATIENT_CLINIC_OR_DEPARTMENT_OTHER): Payer: Medicare Other | Admitting: Physician Assistant

## 2013-04-23 ENCOUNTER — Other Ambulatory Visit (HOSPITAL_BASED_OUTPATIENT_CLINIC_OR_DEPARTMENT_OTHER): Payer: Medicare Other

## 2013-04-23 VITALS — BP 153/72 | HR 71 | Temp 98.4°F | Resp 18 | Ht 63.0 in | Wt 218.8 lb

## 2013-04-23 DIAGNOSIS — C7951 Secondary malignant neoplasm of bone: Secondary | ICD-10-CM

## 2013-04-23 DIAGNOSIS — C50911 Malignant neoplasm of unspecified site of right female breast: Secondary | ICD-10-CM

## 2013-04-23 DIAGNOSIS — C50919 Malignant neoplasm of unspecified site of unspecified female breast: Secondary | ICD-10-CM

## 2013-04-23 DIAGNOSIS — M1990 Unspecified osteoarthritis, unspecified site: Secondary | ICD-10-CM

## 2013-04-23 DIAGNOSIS — D631 Anemia in chronic kidney disease: Secondary | ICD-10-CM

## 2013-04-23 DIAGNOSIS — C7952 Secondary malignant neoplasm of bone marrow: Secondary | ICD-10-CM

## 2013-04-23 DIAGNOSIS — N189 Chronic kidney disease, unspecified: Secondary | ICD-10-CM

## 2013-04-23 DIAGNOSIS — M879 Osteonecrosis, unspecified: Secondary | ICD-10-CM | POA: Insufficient documentation

## 2013-04-23 DIAGNOSIS — Z1231 Encounter for screening mammogram for malignant neoplasm of breast: Secondary | ICD-10-CM

## 2013-04-23 DIAGNOSIS — D649 Anemia, unspecified: Secondary | ICD-10-CM

## 2013-04-23 DIAGNOSIS — E875 Hyperkalemia: Secondary | ICD-10-CM

## 2013-04-23 DIAGNOSIS — M8708 Idiopathic aseptic necrosis of bone, other site: Secondary | ICD-10-CM

## 2013-04-23 LAB — CBC WITH DIFFERENTIAL/PLATELET
BASO%: 0.6 % (ref 0.0–2.0)
Basophils Absolute: 0 10*3/uL (ref 0.0–0.1)
EOS%: 4.1 % (ref 0.0–7.0)
Eosinophils Absolute: 0.3 10*3/uL (ref 0.0–0.5)
HCT: 30.4 % — ABNORMAL LOW (ref 34.8–46.6)
HEMOGLOBIN: 9.6 g/dL — AB (ref 11.6–15.9)
LYMPH%: 26.6 % (ref 14.0–49.7)
MCH: 28.5 pg (ref 25.1–34.0)
MCHC: 31.6 g/dL (ref 31.5–36.0)
MCV: 90.3 fL (ref 79.5–101.0)
MONO#: 0.5 10*3/uL (ref 0.1–0.9)
MONO%: 7.4 % (ref 0.0–14.0)
NEUT#: 4 10*3/uL (ref 1.5–6.5)
NEUT%: 61.3 % (ref 38.4–76.8)
Platelets: 251 10*3/uL (ref 145–400)
RBC: 3.37 10*6/uL — ABNORMAL LOW (ref 3.70–5.45)
RDW: 16.1 % — AB (ref 11.2–14.5)
WBC: 6.5 10*3/uL (ref 3.9–10.3)
lymph#: 1.7 10*3/uL (ref 0.9–3.3)

## 2013-04-23 LAB — COMPREHENSIVE METABOLIC PANEL (CC13)
ALBUMIN: 3.2 g/dL — AB (ref 3.5–5.0)
ALT: 12 U/L (ref 0–55)
AST: 17 U/L (ref 5–34)
Alkaline Phosphatase: 101 U/L (ref 40–150)
Anion Gap: 10 mEq/L (ref 3–11)
BUN: 20.3 mg/dL (ref 7.0–26.0)
CO2: 22 mEq/L (ref 22–29)
CREATININE: 0.8 mg/dL (ref 0.6–1.1)
Calcium: 9.8 mg/dL (ref 8.4–10.4)
Chloride: 109 mEq/L (ref 98–109)
Glucose: 85 mg/dl (ref 70–140)
POTASSIUM: 5.4 meq/L — AB (ref 3.5–5.1)
Sodium: 141 mEq/L (ref 136–145)
Total Bilirubin: 0.41 mg/dL (ref 0.20–1.20)
Total Protein: 6.8 g/dL (ref 6.4–8.3)

## 2013-04-23 NOTE — Progress Notes (Signed)
Patient ID: Brittney Cunningham, female   DOB: [birth year], 73 y.o.   MRN: [id number]  [place]  [phone] [phone]  OFFICE PROGRESS NOTE   ID: Brittney Cunningham   DOB: October 14, 1940  MR#: [id number]  EHU#:[id number]   PCP: Brittney Homans, MD SU: Rolm Bookbinder OTHER MD: Brittney Cunningham, Brittney Cunningham, Brittney Cunningham  CHIEF COMPLAINT:  Metastatic Breast Cancer   HISTORY OF PRESENT ILLNESS: Brittney Cunningham was diagnosed with right breast carcinoma in May of 2011, a biopsy showing a grade 2 invasive ductal carcinoma, T2 NXM1, stage IV at presentation. There was bone only involvement. Tumor was strongly ER and PR positive, HER-2/neu negative, with an MIP-1 of 26%.  She was treated neoadjuvantly with letrozole and zoledronic acid beginning in June of 2011. She is status post right modified radical mastectomy in February 2012 for a ypT2 ypN2, grade 1 invasive ductal carcinoma. Margins were negative.   Her subsequent history is as detailed below   INTERVAL HISTORY: Brittney Cunningham returns alone today for followup of her metastatic breast cancer. She continues to receive monthly injections of fulvestrant, Aranesp, and B12 to treat both her metastatic breast cancer and her anemia. She's tolerating all of these agents very well, and has few complaints today.  Brittney Cunningham continues to have chronic pain, especially in her knees and hips. She does find that the pain increases for 3-4 days following the fulvestrant injections. Her pain is very well-managed currently with MS Contin, 30 milligrams twice daily.   We did note that Brittney Cunningham's potassium was slightly elevated today at 5.4. She tells me she has been eating "lots of navel oranges" over the last couple of weeks and feels like this hyperkalemia is going to be diet related. She tells me this has occurred in the past as well, and looking back in fact her potassium was 5.3 in December. She denies any chest pain or palpitations. She's had no muscle  cramping.   REVIEW OF SYSTEMS: Brittney Cunningham has had no fevers, chills, or night sweats. She has only occasional hot flashes, but nothing she would consider problematic. She continues to have some fatigue.  She's had no rashes or skin changes and denies any abnormal bruising or bleeding. She continues to be followed by Dr. Enrique Cunningham for her history of osteonecrosis of the jaw. She tells me she has no pain in the jaw, only some "discomfort". Her appetite is good. She's had no nausea or emesis. She is managing her constipation with MiraLAX. She's had no change in urinary habits, specifically no dysuria or hematuria. She's had no increased cough or phlegm production. She continues to have shortness of breath primarily with exertion, but notes that this is stable. She's had no abnormal headaches, dizziness, or change in vision. She has some occasional swelling in the lower extremities that comes and goes, and this is also stable.  A detailed review of systems is otherwise stable and noncontributory.  PAST MEDICAL HISTORY: Past Medical History  Diagnosis Date  . Anemia   . Depression   . Diabetes mellitus type II   . GERD (gastroesophageal reflux disease)   . Hyperlipidemia   . Hypothyroidism   . Osteoarthritis   . Carotid artery stenosis     bilateral  . CAD (coronary artery disease)   . OSA (obstructive sleep apnea)   . Breast cancer     right breast cancer-invasive  ductal carcinoma (StageIV)  . Hypertension   . Anxiety   . Carotid artery occlusion   .  Unspecified constipation 03/15/2012  . Bursitis   . Collagen vascular disease   . History of radiation therapy 10/31/12-11/13/12    rt&lt humerous 30Gy/12fx    PAST SURGICAL HISTORY: Past Surgical History  Procedure Laterality Date  . Incisional breast biopsy      remoted left breast biopsy  . Cesarean section    . Other surgical history      GYN surgery  . Knee surgery      right knee surgery  . Carotid endarterectomy      right carotid   . Port-a-cath removal    . Modified mastectomy      Right breast  . Breast surgery    . Mastectomy    . Carotid stent      left    FAMILY HISTORY Family History  Problem Relation Age of Onset  . Arthritis    . Coronary artery disease      first degree relative  . Stroke      first degree relative  . Diabetes Mother   . Heart disease Mother   . Hypertension Mother   . Cancer Mother   . Deep vein thrombosis Mother   . Colon cancer Neg Hx   . Stomach cancer Neg Hx   . Cancer Father     Pancreatic cancer  . Breast cancer Paternal Aunt 40  The patient's father died from pancreatic cancer at the age of 17. The patient's mother died from complications of diabetes and heart disease at the age of 89. The patient has 2 sisters and 2 brothers. There is no history of breast cancer or ovarian cancer in the immediate family. One of the patient's paternal aunts out of 6 paternal aunts had breast cancer diagnosed in her 32s.    GYNECOLOGIC HISTORY: The patient is GX, P3. First pregnancy to term age 45. She went through the change of life in her late 35s. She never took hormones.   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 Her husband, Brittney Cunningham, is disabled secondary to a fall.  He has difficulty with walking. He used to work for [employer] previously. The patient's son, Brittney Cunningham, lives in [place], and works for [employer]. Daughter, Brittney Cunningham, lives in [place], and works for [employer]. Daughter, Brittney Cunningham, lives in [place], [place], and works as a Geologist, engineering. The patient attends [employer].  ADVANCED DIRECTIVES: Not on file  HEALTH MAINTENANCE:  (Updated 04/23/2013) History  Substance Use Topics  . Smoking status: Former Smoker -- 1.00 packs/day for 20 years    Types: Cigarettes    Quit date: 01/31/1988  . Smokeless tobacco: Never Used  . Alcohol Use: No    Colonoscopy: Not on file  PAP: Not on  file  Bone density: July 2011, Normal  Lipid panel: 06/26/2012  Allergies  Allergen Reactions  . Chlorhexidine Hives, Itching and Rash    This was most likely a CONTACT DERMATITIS versus true systemic allergic reaction  . Adhesive [Tape] Hives  . Codeine Swelling    Current Outpatient Prescriptions  Medication Sig Dispense Refill  . aspirin 81 MG chewable tablet Chew 81 mg by mouth daily.      Marland Kitchen atorvastatin (LIPITOR) 40 MG tablet Take 1 tablet (40 mg total) by mouth daily.  90 tablet  0  . Blood Glucose Monitoring Suppl (ACCU-CHEK NANO SMARTVIEW) W/DEVICE KIT Use to check blood sugar twice a day. DX 250.00  1 kit  0  . carvedilol (COREG) 12.5 MG tablet Take 1 tablet (12.5 mg total) by mouth 2 (two) times daily with a meal.  30 tablet  0  . chlorhexidine (PERIDEX) 0.12 % solution Rinse for 90  seconds  with 15 mLs three times daily. Spit out excess. Do NOT swallow.  480 mL  prn  . clopidogrel (PLAVIX) 75 MG tablet Take 1 tablet (75 mg total) by mouth daily.  30 tablet  8  . cyanocobalamin (,VITAMIN B-12,) 1000 MCG/ML injection Inject 1,000 mcg into the muscle every 30 (thirty) days. Last dose 03/09/2011      . darbepoetin (ARANESP) 200 MCG/0.4ML SOLN Inject 200 mcg into the skin once. Every 30 days      . fulvestrant (FASLODEX) 250 MG/5ML injection Inject 500 mg into the muscle every 30 (thirty) days. One injection each buttock over 1-2 minutes. Warm prior to use.      Marland Kitchen glucose blood (ACCU-CHEK AVIVA) test strip Use as instructed to check blood sugar twice a day  Dx 250.00  100 each  3  . hydrochlorothiazide (MICROZIDE) 12.5 MG capsule Take 1 capsule (12.5 mg total) by mouth daily.  30 capsule  3  . ibuprofen (ADVIL) 200 MG tablet Take 200 mg by mouth 3 (three) times daily.        . insulin glargine (LANTUS) 100 UNIT/ML injection Inject 0.6 mLs (60 Units total) into the skin at bedtime. Pt wants vials.  30 mL  1  . levothyroxine (SYNTHROID, LEVOTHROID) 200 MCG tablet Take 1 tablet (200 mcg  total) by mouth daily before breakfast.  30 tablet  3  . levothyroxine (SYNTHROID, LEVOTHROID) 75 MCG tablet MOn Wed & Fridays with the daily      . losartan (COZAAR) 50 MG tablet Take 1 tablet (50 mg total) by mouth daily.  30 tablet  5  . metFORMIN (GLUCOPHAGE) 500 MG tablet Take 1 tablet (500 mg total) by mouth every morning.  30 tablet  5  . morphine (MS CONTIN) 30 MG 12 hr tablet Take 1 tablet (30 mg total) by mouth 2 (two) times daily.  60 tablet  0  . polyethylene glycol (MIRALAX / GLYCOLAX) packet Take 17 g by mouth daily as needed.      Marland Kitchen glycerin adult (GLYCERIN ADULT) 2 G SUPP Place 1 suppository rectally once as needed (constipation).  10 suppository  0  . [DISCONTINUED] simvastatin (ZOCOR) 40 MG tablet Take 40 mg by mouth every evening.       No current facility-administered medications for this visit.    OBJECTIVE: Elderly white woman who appears comfortable  and is in no acute distress Filed Vitals:   04/23/13 1219  BP: 153/72  Pulse: 71  Temp: 98.4 F (36.9 C)  Resp: 18     Body mass index is 38.77 kg/(m^2).    ECOG FS: 2 Filed Weights   04/23/13 1219  Weight: 218 lb 12.8 oz (99.247 kg)   Physical Exam: HEENT:  Sclerae anicteric.  Oropharynx notable for osteonecrosis. No ulcerations or mucositis. No evidence of candidiasis. Neck is Cunningham, trachea midline.   NODES:  No cervical or supraclavicular lymphadenopathy palpated.  BREAST EXAM: Patient is status post right mastectomy. There are some telangiectasias status post radiation therapy, but no additional skin changes. No suspicious nodularity and no evidence of local recurrence. Left breast is unremarkable. Axillae are benign bilaterally, with no palpable lymphadenopathy. LUNGS:  Clear to auscultation bilaterally with good excursion.  No wheezes or rhonchi  HEART:  Regular rate and rhythm.  ABDOMEN:  Soft, obese, nontender. No organomegaly or masses palpated. Positive bowel sounds.  MSK:  No focal spinal  tenderness to palpation. Some limit in range of motion of the right upper extremity. EXTREMITIES:  No peripheral edema.  No lymphedema in the right upper extremity. SKIN:  Benign with no visible rashes or skin lesions. No ecchymoses or petechiae. Mild pallor. NEURO:  Nonfocal. Well oriented.  Appropriate affect.      LAB RESULTS: Lab Results  Component Value Date   WBC 6.5 04/23/2013   NEUTROABS 4.0 04/23/2013   HGB 9.6* 04/23/2013   HCT 30.4* 04/23/2013   MCV 90.3 04/23/2013   PLT 251 04/23/2013      Chemistry      Component Value Date/Time   NA 141 04/23/2013 1109   NA 135 04/13/2012 1909   K 5.4* 04/23/2013 1109   K 4.5 04/13/2012 1909   CL 104 07/08/2012 1023   CL 99 04/13/2012 1909   CO2 22 04/23/2013 1109   CO2 24 04/13/2012 1909   BUN 20.3 04/23/2013 1109   BUN 29* 04/13/2012 1909   CREATININE 0.8 04/23/2013 1109   CREATININE 0.90 04/13/2012 1909      Component Value Date/Time   CALCIUM 9.8 04/23/2013 1109   CALCIUM 9.5 04/13/2012 1909   ALKPHOS 101 04/23/2013 1109   ALKPHOS 126* 06/26/2012 1033   AST 17 04/23/2013 1109   AST 14 06/26/2012 1033   ALT 12 04/23/2013 1109   ALT <8 06/26/2012 1033   BILITOT 0.41 04/23/2013 1109   BILITOT 0.3 06/26/2012 1033      STUDIES:  Most recent left mammogram was in March 2014 and was unremarkable. It is due to be repeated later this month.    ASSESSMENT: 73 y.o. [place] woman:   (1) Status post right breast biopsy in 05/2009 for a grade 2 invasive ductal carcinoma, T2 NX M1, Stage IV, with bone-only involvement, strongly estrogen and progesterone receptor-positive, HER-2-negative with an MIB-1 of 26%.   (2) Neoadjuvantly she received Letrozole and zoledronic acid beginning in June of 2011 and underwent right modified radical mastectomy February of 2012 for a ypT2 ypN2, grade 1 invasive ductal carcinoma with negative margins.   (3) She completed radiation therapy in August of 2012.   (4) She has continued on Letrozole but was switched  from Zoledronic acid to Denosumab Delton See) because of concerns regarding her serum creatinine. Delton See was being given every 8 weeks.  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  (6) symptomatic anemia, with creatinine clearance < 60 cc/min; Darbepoietin Q14d started 11/03/2011; received Feraheme 01/05/2012  (7) On subcutaneous B-12 supplementation monthly.  (8) Pain in left upper extremity with osseous metastasis, osteoarthritis, tendinopathy, and bursitis as confirmed by recent MRI.  (9) Anemia, multifactorial with renal disease, on Aranesp monthly  (10)  letrozole was discontinued in September 2014 with evidence of disease progression. She was started on fulvestrant injections, first given on 10/17/2012.    PLAN: Dillyn seems to be doing well with regards to her metastatic breast cancer, and clinically there is no evidence of disease progression at this time. We are making no changes in her current regimen, and she will continue to receive fulvestrant, Aranesp, and B12 injections on a monthly basis. All of these are due again on April 6. We will continue to check labs with each monthly visit. She will continue on her current pain regimen, specifically OxyContin, 30 mg twice  daily.   Rashaunda is due for her left mammogram later this month and that will be scheduled for her. She will continue to be followed by Dr. Enrique Cunningham as noted above, and she will return to see [place] in June. We will follow her with lab work and physical exam as before, restaging her when there is clinical suspicion of progression.  All the above was reviewed in detail with Brittney Cunningham. She voices her understanding and agreement with this plan, and knows to call with any changes or problems.    Janziel Hockett PA-C 04/23/2013  12:49 PM

## 2013-04-24 ENCOUNTER — Telehealth: Payer: Self-pay | Admitting: Oncology

## 2013-04-24 NOTE — Telephone Encounter (Signed)
s.w. pt and advised on 4.8.15 mammo at [facility]...pt ok adn aware

## 2013-05-01 ENCOUNTER — Telehealth: Payer: Self-pay | Admitting: *Deleted

## 2013-05-01 NOTE — Telephone Encounter (Signed)
Wants to pick up her MS Contin 30 mg script on Monday when she comes in for her injection.

## 2013-05-02 ENCOUNTER — Other Ambulatory Visit: Payer: Self-pay

## 2013-05-02 ENCOUNTER — Other Ambulatory Visit: Payer: Self-pay | Admitting: *Deleted

## 2013-05-02 DIAGNOSIS — C7951 Secondary malignant neoplasm of bone: Principal | ICD-10-CM

## 2013-05-02 DIAGNOSIS — C50919 Malignant neoplasm of unspecified site of unspecified female breast: Secondary | ICD-10-CM

## 2013-05-02 MED ORDER — MORPHINE SULFATE ER 30 MG PO TBCR: 30.0000 mg | EXTENDED_RELEASE_TABLET | Freq: Two times a day (BID) | ORAL | Status: DC

## 2013-05-02 NOTE — Telephone Encounter (Signed)
Having difficulty printing

## 2013-05-02 NOTE — Telephone Encounter (Signed)
Multiple attempts to print Rx due to computer/printer error.

## 2013-05-05 ENCOUNTER — Ambulatory Visit: Payer: Self-pay

## 2013-05-05 ENCOUNTER — Other Ambulatory Visit: Payer: Self-pay

## 2013-05-06 ENCOUNTER — Other Ambulatory Visit: Payer: Self-pay | Admitting: *Deleted

## 2013-05-06 DIAGNOSIS — C50919 Malignant neoplasm of unspecified site of unspecified female breast: Secondary | ICD-10-CM

## 2013-05-06 DIAGNOSIS — C7951 Secondary malignant neoplasm of bone: Principal | ICD-10-CM

## 2013-05-06 MED ORDER — MORPHINE SULFATE ER 30 MG PO TBCR: 30.0000 mg | EXTENDED_RELEASE_TABLET | Freq: Two times a day (BID) | ORAL | Status: DC

## 2013-05-07 ENCOUNTER — Inpatient Hospital Stay: Payer: Self-pay

## 2013-05-07 ENCOUNTER — Ambulatory Visit (HOSPITAL_BASED_OUTPATIENT_CLINIC_OR_DEPARTMENT_OTHER): Payer: Medicare Other

## 2013-05-07 ENCOUNTER — Ambulatory Visit: Payer: Self-pay

## 2013-05-07 ENCOUNTER — Other Ambulatory Visit (HOSPITAL_BASED_OUTPATIENT_CLINIC_OR_DEPARTMENT_OTHER): Payer: Medicare Other

## 2013-05-07 VITALS — BP 174/37 | HR 63 | Temp 98.6°F

## 2013-05-07 DIAGNOSIS — D649 Anemia, unspecified: Secondary | ICD-10-CM

## 2013-05-07 DIAGNOSIS — C7951 Secondary malignant neoplasm of bone: Principal | ICD-10-CM

## 2013-05-07 DIAGNOSIS — N289 Disorder of kidney and ureter, unspecified: Secondary | ICD-10-CM

## 2013-05-07 DIAGNOSIS — Z5111 Encounter for antineoplastic chemotherapy: Secondary | ICD-10-CM

## 2013-05-07 DIAGNOSIS — K219 Gastro-esophageal reflux disease without esophagitis: Secondary | ICD-10-CM

## 2013-05-07 DIAGNOSIS — C50919 Malignant neoplasm of unspecified site of unspecified female breast: Secondary | ICD-10-CM

## 2013-05-07 DIAGNOSIS — E538 Deficiency of other specified B group vitamins: Secondary | ICD-10-CM

## 2013-05-07 DIAGNOSIS — C7952 Secondary malignant neoplasm of bone marrow: Secondary | ICD-10-CM

## 2013-05-07 DIAGNOSIS — C50619 Malignant neoplasm of axillary tail of unspecified female breast: Secondary | ICD-10-CM

## 2013-05-07 DIAGNOSIS — C50911 Malignant neoplasm of unspecified site of right female breast: Secondary | ICD-10-CM

## 2013-05-07 LAB — COMPREHENSIVE METABOLIC PANEL (CC13)
ALK PHOS: 99 U/L (ref 40–150)
ALT: 6 U/L (ref 0–55)
AST: 16 U/L (ref 5–34)
Albumin: 3.3 g/dL — ABNORMAL LOW (ref 3.5–5.0)
Anion Gap: 8 mEq/L (ref 3–11)
BILIRUBIN TOTAL: 0.49 mg/dL (ref 0.20–1.20)
BUN: 25.3 mg/dL (ref 7.0–26.0)
CO2: 22 mEq/L (ref 22–29)
Calcium: 9.5 mg/dL (ref 8.4–10.4)
Chloride: 109 mEq/L (ref 98–109)
Creatinine: 0.9 mg/dL (ref 0.6–1.1)
GLUCOSE: 52 mg/dL — AB (ref 70–140)
Potassium: 4.6 mEq/L (ref 3.5–5.1)
SODIUM: 140 meq/L (ref 136–145)
TOTAL PROTEIN: 7 g/dL (ref 6.4–8.3)

## 2013-05-07 LAB — CBC WITH DIFFERENTIAL/PLATELET
BASO%: 0.5 % (ref 0.0–2.0)
Basophils Absolute: 0 10*3/uL (ref 0.0–0.1)
EOS ABS: 0.2 10*3/uL (ref 0.0–0.5)
EOS%: 3.2 % (ref 0.0–7.0)
HCT: 30.2 % — ABNORMAL LOW (ref 34.8–46.6)
HGB: 9.6 g/dL — ABNORMAL LOW (ref 11.6–15.9)
LYMPH%: 26.4 % (ref 14.0–49.7)
MCH: 28.6 pg (ref 25.1–34.0)
MCHC: 31.8 g/dL (ref 31.5–36.0)
MCV: 90 fL (ref 79.5–101.0)
MONO#: 0.5 10*3/uL (ref 0.1–0.9)
MONO%: 9.1 % (ref 0.0–14.0)
NEUT#: 3.6 10*3/uL (ref 1.5–6.5)
NEUT%: 60.8 % (ref 38.4–76.8)
Platelets: 236 10*3/uL (ref 145–400)
RBC: 3.35 10*6/uL — AB (ref 3.70–5.45)
RDW: 15.5 % — ABNORMAL HIGH (ref 11.2–14.5)
WBC: 5.9 10*3/uL (ref 3.9–10.3)
lymph#: 1.5 10*3/uL (ref 0.9–3.3)

## 2013-05-07 MED ORDER — SODIUM CHLORIDE 0.9 % IJ SOLN
10.0000 mL | INTRAMUSCULAR | Status: DC | PRN
  Administered 2013-05-07: 10 mL via INTRAVENOUS
  Filled 2013-05-07: qty 10

## 2013-05-07 MED ORDER — FULVESTRANT 250 MG/5ML IM SOLN
500.0000 mg | Freq: Once | INTRAMUSCULAR | Status: AC
  Administered 2013-05-07: 500 mg via INTRAMUSCULAR
  Filled 2013-05-07: qty 10

## 2013-05-07 MED ORDER — HEPARIN SOD (PORK) LOCK FLUSH 100 UNIT/ML IV SOLN
500.0000 [IU] | Freq: Once | INTRAVENOUS | Status: AC
  Administered 2013-05-07: 500 [IU] via INTRAVENOUS
  Filled 2013-05-07: qty 5

## 2013-05-07 MED ORDER — CYANOCOBALAMIN 1000 MCG/ML IJ SOLN
1000.0000 ug | Freq: Once | INTRAMUSCULAR | Status: AC
  Administered 2013-05-07: 1000 ug via INTRAMUSCULAR

## 2013-05-07 MED ORDER — DARBEPOETIN ALFA-POLYSORBATE 200 MCG/0.4ML IJ SOLN
200.0000 ug | Freq: Once | INTRAMUSCULAR | Status: AC
  Administered 2013-05-07: 200 ug via SUBCUTANEOUS
  Filled 2013-05-07: qty 0.4

## 2013-05-30 ENCOUNTER — Ambulatory Visit: Payer: Self-pay

## 2013-05-30 ENCOUNTER — Ambulatory Visit: Payer: Self-pay | Admitting: Oncology

## 2013-06-02 ENCOUNTER — Other Ambulatory Visit (HOSPITAL_BASED_OUTPATIENT_CLINIC_OR_DEPARTMENT_OTHER): Payer: Medicare Other

## 2013-06-02 ENCOUNTER — Ambulatory Visit (HOSPITAL_BASED_OUTPATIENT_CLINIC_OR_DEPARTMENT_OTHER): Payer: Medicare Other

## 2013-06-02 VITALS — BP 172/61 | HR 70 | Temp 98.5°F

## 2013-06-02 DIAGNOSIS — C50919 Malignant neoplasm of unspecified site of unspecified female breast: Secondary | ICD-10-CM

## 2013-06-02 DIAGNOSIS — C50911 Malignant neoplasm of unspecified site of right female breast: Secondary | ICD-10-CM

## 2013-06-02 DIAGNOSIS — C50619 Malignant neoplasm of axillary tail of unspecified female breast: Secondary | ICD-10-CM

## 2013-06-02 DIAGNOSIS — N289 Disorder of kidney and ureter, unspecified: Secondary | ICD-10-CM

## 2013-06-02 DIAGNOSIS — Z5111 Encounter for antineoplastic chemotherapy: Secondary | ICD-10-CM

## 2013-06-02 DIAGNOSIS — C7951 Secondary malignant neoplasm of bone: Secondary | ICD-10-CM

## 2013-06-02 DIAGNOSIS — E538 Deficiency of other specified B group vitamins: Secondary | ICD-10-CM

## 2013-06-02 DIAGNOSIS — D649 Anemia, unspecified: Secondary | ICD-10-CM

## 2013-06-02 DIAGNOSIS — C7952 Secondary malignant neoplasm of bone marrow: Secondary | ICD-10-CM

## 2013-06-02 DIAGNOSIS — K219 Gastro-esophageal reflux disease without esophagitis: Secondary | ICD-10-CM

## 2013-06-02 LAB — COMPREHENSIVE METABOLIC PANEL (CC13)
ALBUMIN: 3 g/dL — AB (ref 3.5–5.0)
ALT: <6 U/L (ref 0–55)
AST: 17 U/L (ref 5–34)
Alkaline Phosphatase: 85 U/L (ref 40–150)
Anion Gap: 11 mEq/L (ref 3–11)
BUN: 29.9 mg/dL — AB (ref 7.0–26.0)
CALCIUM: 10 mg/dL (ref 8.4–10.4)
CHLORIDE: 107 meq/L (ref 98–109)
CO2: 21 meq/L — AB (ref 22–29)
Creatinine: 0.9 mg/dL (ref 0.6–1.1)
GLUCOSE: 66 mg/dL — AB (ref 70–140)
POTASSIUM: 4.8 meq/L (ref 3.5–5.1)
Sodium: 138 mEq/L (ref 136–145)
TOTAL PROTEIN: 6.9 g/dL (ref 6.4–8.3)
Total Bilirubin: 0.49 mg/dL (ref 0.20–1.20)

## 2013-06-02 LAB — CBC WITH DIFFERENTIAL/PLATELET
BASO%: 0.3 % (ref 0.0–2.0)
BASOS ABS: 0 10*3/uL (ref 0.0–0.1)
EOS%: 3.5 % (ref 0.0–7.0)
Eosinophils Absolute: 0.2 10*3/uL (ref 0.0–0.5)
HCT: 31.5 % — ABNORMAL LOW (ref 34.8–46.6)
HEMOGLOBIN: 9.5 g/dL — AB (ref 11.6–15.9)
LYMPH#: 1.7 10*3/uL (ref 0.9–3.3)
LYMPH%: 26.5 % (ref 14.0–49.7)
MCH: 27.5 pg (ref 25.1–34.0)
MCHC: 30.2 g/dL — ABNORMAL LOW (ref 31.5–36.0)
MCV: 91.3 fL (ref 79.5–101.0)
MONO#: 0.7 10*3/uL (ref 0.1–0.9)
MONO%: 10.3 % (ref 0.0–14.0)
NEUT#: 3.8 10*3/uL (ref 1.5–6.5)
NEUT%: 59.4 % (ref 38.4–76.8)
Platelets: 271 10*3/uL (ref 145–400)
RBC: 3.45 10*6/uL — ABNORMAL LOW (ref 3.70–5.45)
RDW: 14.8 % — ABNORMAL HIGH (ref 11.2–14.5)
WBC: 6.3 10*3/uL (ref 3.9–10.3)

## 2013-06-02 MED ORDER — CYANOCOBALAMIN 1000 MCG/ML IJ SOLN
1000.0000 ug | Freq: Once | INTRAMUSCULAR | Status: AC
  Administered 2013-06-02: 1000 ug via INTRAMUSCULAR

## 2013-06-02 MED ORDER — FULVESTRANT 250 MG/5ML IM SOLN
500.0000 mg | Freq: Once | INTRAMUSCULAR | Status: AC
  Administered 2013-06-02: 500 mg via INTRAMUSCULAR
  Filled 2013-06-02: qty 10

## 2013-06-02 MED ORDER — DARBEPOETIN ALFA-POLYSORBATE 200 MCG/0.4ML IJ SOLN
200.0000 ug | Freq: Once | INTRAMUSCULAR | Status: AC
  Administered 2013-06-02: 200 ug via SUBCUTANEOUS
  Filled 2013-06-02: qty 0.4

## 2013-06-09 ENCOUNTER — Other Ambulatory Visit: Payer: Self-pay | Admitting: *Deleted

## 2013-06-09 ENCOUNTER — Telehealth: Payer: Self-pay | Admitting: Family Medicine

## 2013-06-09 DIAGNOSIS — C7951 Secondary malignant neoplasm of bone: Principal | ICD-10-CM

## 2013-06-09 DIAGNOSIS — C50919 Malignant neoplasm of unspecified site of unspecified female breast: Secondary | ICD-10-CM

## 2013-06-09 MED ORDER — MORPHINE SULFATE ER 30 MG PO TBCR
30.0000 mg | EXTENDED_RELEASE_TABLET | Freq: Two times a day (BID) | ORAL | Status: DC
Start: 2013-06-09 — End: 2013-07-10

## 2013-06-09 MED ORDER — HYDROCHLOROTHIAZIDE 12.5 MG PO CAPS: 12.5000 mg | ORAL_CAPSULE | Freq: Every day | ORAL | Status: DC

## 2013-06-09 MED ORDER — ATORVASTATIN CALCIUM 40 MG PO TABS: 40.0000 mg | ORAL_TABLET | Freq: Every day | ORAL | Status: DC

## 2013-06-09 NOTE — Telephone Encounter (Signed)
Please inform pt that I sent in 30 day supply of medication. Pt will need an appointment with Dr b for addt med refills.   MD wanted pt to return around 04-28-13

## 2013-06-09 NOTE — Telephone Encounter (Signed)
Refill-atorvastatin calcium  [facility]

## 2013-06-09 NOTE — Telephone Encounter (Signed)
Refill-hydrochlorothiazide  [facility]

## 2013-06-10 ENCOUNTER — Other Ambulatory Visit: Payer: Self-pay

## 2013-06-10 MED ORDER — CARVEDILOL 12.5 MG PO TABS: 12.5000 mg | ORAL_TABLET | Freq: Two times a day (BID) | ORAL | Status: DC

## 2013-06-10 NOTE — Telephone Encounter (Signed)
Informed patient of medication refill and she scheduled appointment for 07/03/13

## 2013-06-11 ENCOUNTER — Encounter: Payer: Self-pay | Admitting: Oncology

## 2013-06-11 ENCOUNTER — Other Ambulatory Visit: Payer: Self-pay | Admitting: Oncology

## 2013-06-19 ENCOUNTER — Encounter (INDEPENDENT_AMBULATORY_CARE_PROVIDER_SITE_OTHER): Payer: Self-pay

## 2013-06-19 ENCOUNTER — Ambulatory Visit
Admission: RE | Admit: 2013-06-19 | Discharge: 2013-06-19 | Disposition: A | Discharge status: Home / Self Care | Payer: Medicare Other | Source: Ambulatory Visit | Attending: Physician Assistant | Admitting: Physician Assistant

## 2013-06-19 DIAGNOSIS — Z1231 Encounter for screening mammogram for malignant neoplasm of breast: Secondary | ICD-10-CM

## 2013-06-27 ENCOUNTER — Other Ambulatory Visit: Payer: Self-pay

## 2013-06-30 ENCOUNTER — Ambulatory Visit (HOSPITAL_BASED_OUTPATIENT_CLINIC_OR_DEPARTMENT_OTHER): Payer: Medicare Other

## 2013-06-30 ENCOUNTER — Telehealth: Payer: Self-pay | Admitting: Oncology

## 2013-06-30 ENCOUNTER — Other Ambulatory Visit (HOSPITAL_BASED_OUTPATIENT_CLINIC_OR_DEPARTMENT_OTHER): Payer: Medicare Other

## 2013-06-30 ENCOUNTER — Ambulatory Visit (HOSPITAL_BASED_OUTPATIENT_CLINIC_OR_DEPARTMENT_OTHER): Payer: Medicare Other | Admitting: Oncology

## 2013-06-30 VITALS — BP 187/60 | HR 73 | Temp 99.3°F | Resp 18 | Ht 63.0 in | Wt 202.8 lb

## 2013-06-30 DIAGNOSIS — C50919 Malignant neoplasm of unspecified site of unspecified female breast: Secondary | ICD-10-CM

## 2013-06-30 DIAGNOSIS — Z95828 Presence of other vascular implants and grafts: Secondary | ICD-10-CM

## 2013-06-30 DIAGNOSIS — C7951 Secondary malignant neoplasm of bone: Principal | ICD-10-CM

## 2013-06-30 DIAGNOSIS — N289 Disorder of kidney and ureter, unspecified: Secondary | ICD-10-CM

## 2013-06-30 DIAGNOSIS — C50911 Malignant neoplasm of unspecified site of right female breast: Secondary | ICD-10-CM

## 2013-06-30 DIAGNOSIS — E119 Type 2 diabetes mellitus without complications: Secondary | ICD-10-CM

## 2013-06-30 DIAGNOSIS — N189 Chronic kidney disease, unspecified: Secondary | ICD-10-CM

## 2013-06-30 DIAGNOSIS — C50619 Malignant neoplasm of axillary tail of unspecified female breast: Secondary | ICD-10-CM

## 2013-06-30 DIAGNOSIS — M358 Other specified systemic involvement of connective tissue: Secondary | ICD-10-CM

## 2013-06-30 DIAGNOSIS — C7952 Secondary malignant neoplasm of bone marrow: Secondary | ICD-10-CM

## 2013-06-30 DIAGNOSIS — K219 Gastro-esophageal reflux disease without esophagitis: Secondary | ICD-10-CM

## 2013-06-30 DIAGNOSIS — Z170 Estrogen receptor positive status [ER+]: Secondary | ICD-10-CM

## 2013-06-30 DIAGNOSIS — D631 Anemia in chronic kidney disease: Secondary | ICD-10-CM

## 2013-06-30 DIAGNOSIS — D649 Anemia, unspecified: Secondary | ICD-10-CM

## 2013-06-30 DIAGNOSIS — I6529 Occlusion and stenosis of unspecified carotid artery: Secondary | ICD-10-CM

## 2013-06-30 DIAGNOSIS — E538 Deficiency of other specified B group vitamins: Secondary | ICD-10-CM

## 2013-06-30 DIAGNOSIS — M3589 Other specified systemic involvement of connective tissue: Secondary | ICD-10-CM

## 2013-06-30 DIAGNOSIS — Z5111 Encounter for antineoplastic chemotherapy: Secondary | ICD-10-CM

## 2013-06-30 HISTORY — PX: TRANSTHORACIC ECHOCARDIOGRAM: SHX275

## 2013-06-30 HISTORY — PX: CARDIAC CATHETERIZATION: SHX172

## 2013-06-30 LAB — CBC WITH DIFFERENTIAL/PLATELET
BASO%: 0.8 % (ref 0.0–2.0)
BASOS ABS: 0 10*3/uL (ref 0.0–0.1)
EOS%: 4.5 % (ref 0.0–7.0)
Eosinophils Absolute: 0.3 10*3/uL (ref 0.0–0.5)
HEMATOCRIT: 31.5 % — AB (ref 34.8–46.6)
HEMOGLOBIN: 10.1 g/dL — AB (ref 11.6–15.9)
LYMPH#: 2 10*3/uL (ref 0.9–3.3)
LYMPH%: 31.4 % (ref 14.0–49.7)
MCH: 28.3 pg (ref 25.1–34.0)
MCHC: 32 g/dL (ref 31.5–36.0)
MCV: 88.3 fL (ref 79.5–101.0)
MONO#: 0.5 10*3/uL (ref 0.1–0.9)
MONO%: 8.4 % (ref 0.0–14.0)
NEUT#: 3.6 10*3/uL (ref 1.5–6.5)
NEUT%: 54.9 % (ref 38.4–76.8)
Platelets: 293 10*3/uL (ref 145–400)
RBC: 3.57 10*6/uL — ABNORMAL LOW (ref 3.70–5.45)
RDW: 14.8 % — ABNORMAL HIGH (ref 11.2–14.5)
WBC: 6.5 10*3/uL (ref 3.9–10.3)

## 2013-06-30 LAB — COMPREHENSIVE METABOLIC PANEL (CC13)
ALT: 9 U/L (ref 0–55)
ANION GAP: 14 meq/L — AB (ref 3–11)
AST: 18 U/L (ref 5–34)
Albumin: 3.2 g/dL — ABNORMAL LOW (ref 3.5–5.0)
Alkaline Phosphatase: 118 U/L (ref 40–150)
BUN: 20.9 mg/dL (ref 7.0–26.0)
CALCIUM: 9.5 mg/dL (ref 8.4–10.4)
CHLORIDE: 106 meq/L (ref 98–109)
CO2: 18 meq/L — AB (ref 22–29)
Creatinine: 0.9 mg/dL (ref 0.6–1.1)
Glucose: 70 mg/dl (ref 70–140)
Potassium: 4.9 mEq/L (ref 3.5–5.1)
Sodium: 138 mEq/L (ref 136–145)
Total Bilirubin: 0.45 mg/dL (ref 0.20–1.20)
Total Protein: 7.1 g/dL (ref 6.4–8.3)

## 2013-06-30 MED ORDER — SODIUM CHLORIDE 0.9 % IJ SOLN
10.0000 mL | INTRAMUSCULAR | Status: DC | PRN
  Administered 2013-06-30: 10 mL via INTRAVENOUS
  Filled 2013-06-30: qty 10

## 2013-06-30 MED ORDER — FULVESTRANT 250 MG/5ML IM SOLN
500.0000 mg | Freq: Once | INTRAMUSCULAR | Status: AC
  Administered 2013-06-30: 500 mg via INTRAMUSCULAR
  Filled 2013-06-30: qty 10

## 2013-06-30 MED ORDER — HEPARIN SOD (PORK) LOCK FLUSH 100 UNIT/ML IV SOLN
500.0000 [IU] | Freq: Once | INTRAVENOUS | Status: AC
  Administered 2013-06-30: 500 [IU] via INTRAVENOUS
  Filled 2013-06-30: qty 5

## 2013-06-30 MED ORDER — CYANOCOBALAMIN 1000 MCG/ML IJ SOLN
1000.0000 ug | Freq: Once | INTRAMUSCULAR | Status: AC
  Administered 2013-06-30: 1000 ug via INTRAMUSCULAR

## 2013-06-30 MED ORDER — DARBEPOETIN ALFA-POLYSORBATE 200 MCG/0.4ML IJ SOLN
200.0000 ug | Freq: Once | INTRAMUSCULAR | Status: AC
  Administered 2013-06-30: 200 ug via SUBCUTANEOUS
  Filled 2013-06-30: qty 0.4

## 2013-06-30 NOTE — Patient Instructions (Signed)

## 2013-06-30 NOTE — Progress Notes (Signed)
Patient ID: Brittney Cunningham, female   DOB: 09-29-1940, 73 y.o.   MRN: [id number]  [place]  Telephone:(336) [phone] Fax:(336) [phone]  OFFICE PROGRESS NOTE   ID: Brittney Cunningham   DOB: 1940/10/13  MR#: [id number]  CVE#:[id number]   PCP: Brittney Homans, MD SU: Rolm Bookbinder OTHER MD: Brittney Cunningham, Brittney Cunningham, Brittney Cunningham  CHIEF COMPLAINT:  Metastatic Breast Cancer   HISTORY OF PRESENT ILLNESS: Brittney Cunningham was diagnosed with right breast carcinoma in May of 2011, a biopsy showing a grade 2 invasive ductal carcinoma, T2 NXM1, stage IV at presentation. There was bone only involvement. Tumor was strongly ER and PR positive, HER-2/neu negative, with an MIP-1 of 26%.  She was treated neoadjuvantly with letrozole and zoledronic acid beginning in June of 2011. She is status post right modified radical mastectomy in February 2012 for a ypT2 ypN2, grade 1 invasive ductal carcinoma. Margins were negative.   Her subsequent history is as detailed below   INTERVAL HISTORY: Brittney Cunningham returns today for followup of her metastatic breast cancer as well as her B12 deficiency and anemia of renal failure.. She continues to receive monthly injections of fulvestrant, Aranesp, and B12. Since she lost a little weight, she has had a little bit more sensitivity to the fulvestrant injections. She has discomfort "in her rear" for about 3 days. However she likes the idea of not taking more pills every day and not having to come to the doctor often for her treatments  REVIEW OF SYSTEMS: Brittney Cunningham has managed to lose some weight back just "watching what I eat". She still not exercising regularly. She has been going through a lot of graduations asked her grandchildren get older and she tells me her granddaughter and the ICD code did grade her first year there". Arthritis is the biggest problem, and she has joint pains "here and there" which are not more intense or persistent than before. Sometimes at night when she  lies down she has pain in her legs. This doesn't happen during the day. It also doesn't happen every night. She says her diabetes is well controlled. She denies anxiety or depression. A detailed review of systems today was otherwise stable  PAST MEDICAL HISTORY: Past Medical History  Diagnosis Date  . Anemia   . Depression   . Diabetes mellitus type II   . GERD (gastroesophageal reflux disease)   . Hyperlipidemia   . Hypothyroidism   . Osteoarthritis   . Carotid artery stenosis     bilateral  . CAD (coronary artery disease)   . OSA (obstructive sleep apnea)   . Breast cancer     right breast cancer-invasive  ductal carcinoma (StageIV)  . Hypertension   . Anxiety   . Carotid artery occlusion   . Unspecified constipation 03/15/2012  . Bursitis   . Collagen vascular disease   . History of radiation therapy 10/31/12-11/13/12    rt&lt humerous 30Gy/17fx    PAST SURGICAL HISTORY: Past Surgical History  Procedure Laterality Date  . Incisional breast biopsy      remoted left breast biopsy  . Cesarean section    . Other surgical history      GYN surgery  . Knee surgery      right knee surgery  . Carotid endarterectomy      right carotid  . Port-a-cath removal    . Modified mastectomy      Right breast  . Breast surgery    . Mastectomy    . Carotid  stent      left    FAMILY HISTORY Family History  Problem Relation Age of Onset  . Arthritis    . Coronary artery disease      first degree relative  . Stroke      first degree relative  . Diabetes Mother   . Heart disease Mother   . Hypertension Mother   . Cancer Mother   . Deep vein thrombosis Mother   . Colon cancer Neg Hx   . Stomach cancer Neg Hx   . Cancer Father     Pancreatic cancer  . Breast cancer Paternal Aunt 77  The patient's father died from pancreatic cancer at the age of 20. The patient's mother died from complications of diabetes and heart disease at the age of 74. The patient has 2 sisters and 2  brothers. There is no history of breast cancer or ovarian cancer in the immediate family. One of the patient's paternal aunts out of 6 paternal aunts had breast cancer diagnosed in her 32s.    GYNECOLOGIC HISTORY: The patient is GX, P3. First pregnancy to term age 36. She went through the change of life in her late 18s. She never took hormones.   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 Her husband, Brittney Cunningham, is disabled secondary to a fall.  He has difficulty with walking. He used to work for [employer] previously. The patient's son, Brittney Cunningham, lives in [place], and works for [employer]. Daughter, Brittney Cunningham, lives in [place], and works for [employer]. Daughter, Brittney Cunningham, lives in [place], [place], and works as a Geologist, engineering. The patient attends [employer].  ADVANCED DIRECTIVES: Not on file  HEALTH MAINTENANCE:  (Updated 04/23/2013) History  Substance Use Topics  . Smoking status: Former Smoker -- 1.00 packs/day for 20 years    Types: Cigarettes    Quit date: 01/31/1988  . Smokeless tobacco: Never Used  . Alcohol Use: No    Colonoscopy: Not on file  PAP: Not on file  Bone density: July 2011, Normal  Lipid panel: 06/26/2012  Allergies  Allergen Reactions  . Chlorhexidine Hives, Itching and Rash    This was most likely a CONTACT DERMATITIS versus true systemic allergic reaction  . Adhesive [Tape] Hives  . Codeine Swelling    Current Outpatient Prescriptions  Medication Sig Dispense Refill  . aspirin 81 MG chewable tablet Chew 81 mg by mouth daily.      Marland Kitchen atorvastatin (LIPITOR) 40 MG tablet Take 1 tablet (40 mg total) by mouth daily.  30 tablet  0  . Blood Glucose Monitoring Suppl (ACCU-CHEK NANO SMARTVIEW) W/DEVICE KIT Use to check blood sugar twice a day. DX 250.00  1 kit  0  . carvedilol (COREG) 12.5 MG tablet Take 1 tablet (12.5 mg total) by mouth 2 (two) times daily with a meal.  30 tablet   0  . chlorhexidine (PERIDEX) 0.12 % solution Rinse for 90  seconds  with 15 mLs three times daily. Spit out excess. Do NOT swallow.  480 mL  prn  . clopidogrel (PLAVIX) 75 MG tablet Take 1 tablet (75 mg total) by mouth daily.  30 tablet  8  . cyanocobalamin (,VITAMIN B-12,) 1000 MCG/ML injection Inject 1,000 mcg into the muscle every 30 (thirty) days. Last dose 03/09/2011      . darbepoetin (ARANESP) 200 MCG/0.4ML SOLN Inject 200 mcg into the skin once. Every 30 days      .  fulvestrant (FASLODEX) 250 MG/5ML injection Inject 500 mg into the muscle every 30 (thirty) days. One injection each buttock over 1-2 minutes. Warm prior to use.      Marland Kitchen glucose blood (ACCU-CHEK AVIVA) test strip Use as instructed to check blood sugar twice a day  Dx 250.00  100 each  3  . glycerin adult (GLYCERIN ADULT) 2 G SUPP Place 1 suppository rectally once as needed (constipation).  10 suppository  0  . hydrochlorothiazide (MICROZIDE) 12.5 MG capsule Take 1 capsule (12.5 mg total) by mouth daily.  30 capsule  0  . ibuprofen (ADVIL) 200 MG tablet Take 200 mg by mouth 3 (three) times daily.        . insulin glargine (LANTUS) 100 UNIT/ML injection Inject 0.6 mLs (60 Units total) into the skin at bedtime. Pt wants vials.  30 mL  1  . levothyroxine (SYNTHROID, LEVOTHROID) 200 MCG tablet Take 1 tablet (200 mcg total) by mouth daily before breakfast.  30 tablet  3  . levothyroxine (SYNTHROID, LEVOTHROID) 75 MCG tablet 9mcg MOn Wed & Fridays with the 266mcg daily      . losartan (COZAAR) 50 MG tablet Take 1 tablet (50 mg total) by mouth daily.  30 tablet  5  . metFORMIN (GLUCOPHAGE) 500 MG tablet Take 1 tablet (500 mg total) by mouth every morning.  30 tablet  5  . morphine (MS CONTIN) 30 MG 12 hr tablet Take 1 tablet (30 mg total) by mouth 2 (two) times daily.  60 tablet  0  . polyethylene glycol (MIRALAX / GLYCOLAX) packet Take 17 g by mouth daily as needed.      . [DISCONTINUED] simvastatin (ZOCOR) 40 MG tablet Take 40 mg by  mouth every evening.       No current facility-administered medications for this visit.    OBJECTIVE: Elderly white woman  in no acute distress Filed Vitals:   06/30/13 1046  BP: 187/60  Pulse: 73  Temp: 99.3 F (37.4 C)  Resp: 18     Body mass index is 35.93 kg/(m^2).    ECOG FS: 1 Filed Weights   06/30/13 1046  Weight: 202 lb 12.8 oz (91.989 kg)   Sclerae unicteric, EOMs intact Oropharynx clear and moist-- dentition in poor repair No cervical or supraclavicular adenopathy Lungs no rales or rhonchi Heart regular rate and rhythm Abd soft, obese, nontender, positive bowel sounds MSK no focal spinal tenderness, no upper extremity lymphedema Neuro: nonfocal, well oriented, positive affect Breasts:  the right breast is status post mastectomy. Aside from changes due to the surgery and postoperative radiation, there are no findings of concern. The right axilla is benign. The left breast is unremarkable.   LAB RESULTS: Lab Results  Component Value Date   WBC 6.5 06/30/2013   NEUTROABS 3.6 06/30/2013   HGB 10.1* 06/30/2013   HCT 31.5* 06/30/2013   MCV 88.3 06/30/2013   PLT 293 06/30/2013      Chemistry      Component Value Date/Time   NA 138 06/30/2013 1005   NA 135 04/13/2012 1909   K 4.9 06/30/2013 1005   K 4.5 04/13/2012 1909   CL 104 07/08/2012 1023   CL 99 04/13/2012 1909   CO2 18* 06/30/2013 1005   CO2 24 04/13/2012 1909   BUN 20.9 06/30/2013 1005   BUN 29* 04/13/2012 1909   CREATININE 0.9 06/30/2013 1005   CREATININE 0.90 04/13/2012 1909      Component Value Date/Time   CALCIUM 9.5 06/30/2013  1005   CALCIUM 9.5 04/13/2012 1909   ALKPHOS 118 06/30/2013 1005   ALKPHOS 126* 06/26/2012 1033   AST 18 06/30/2013 1005   AST 14 06/26/2012 1033   ALT 9 06/30/2013 1005   ALT <8 06/26/2012 1033   BILITOT 0.45 06/30/2013 1005   BILITOT 0.3 06/26/2012 1033      STUDIES:  Mm Digital Screening Unilat L  06/19/2013   CLINICAL DATA:  Screening.  EXAM: DIGITAL SCREENING UNILATERAL LEFT MAMMOGRAM WITH CAD   COMPARISON:  Previous exam(s).  ACR Breast Density Category b: There are scattered areas of fibroglandular density.  FINDINGS: There are no findings suspicious for malignancy. Images were processed with CAD.  IMPRESSION: No mammographic evidence of malignancy. A result letter of this screening mammogram will be mailed directly to the patient.  RECOMMENDATION: Screening mammogram in one year. (Code:SM-B-01Y)  BI-RADS CATEGORY  1: Negative.   Electronically Signed   By: Abelardo Diesel M.D.   On: 06/19/2013 14:42    ASSESSMENT: 73 y.o. [place] woman:   (1) Status post right breast biopsy in 05/2009 for a grade 2 invasive ductal carcinoma, T2 NX M1, Stage IV, with bone-only involvement, strongly estrogen and progesterone receptor-positive, HER-2-negative with an MIB-1 of 26%.   (2) Neoadjuvantly she received Letrozole and zoledronic acid beginning in June of 2011 and underwent right modified radical mastectomy February of 2012 for a ypT2 ypN2, grade 1 invasive ductal carcinoma with negative margins.   (3) She completed radiation therapy in August of 2012.   (4) She has continued on Letrozole but was switched from Zoledronic acid to Denosumab Delton See) because of concerns regarding her serum creatinine. Delton See was being given every 8 weeks.  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  (6) symptomatic anemia, with creatinine clearance < 60 cc/min; Darbepoietin Q14d started 11/03/2011; received Feraheme 01/05/2012  (7) On subcutaneous B-12 supplementation monthly.  (8) Pain in left upper extremity with osseous metastasis, osteoarthritis, tendinopathy, and bursitis as confirmed by recent MRI.  (9) Anemia, multifactorial with renal disease, on Aranesp monthly  (10)  letrozole was discontinued in September 2014 with evidence of disease progression. She was started on fulvestrant injections, first given on 10/17/2012.    PLAN: Caroline is tolerating her treatment  well and we are going to continue her every 4 week fulvestrant until there is evidence of disease progression. I have entered her orders for the next several months.  Of course she also receives B12 and Aranesp. Her hemoglobin is slightly better at this point.  Overall I am delighted that she is doing so well. She knows to call for any problems that may develop before next visit here, which will be in 6 months.    Chauncey Cruel, MD  06/30/2013  11:09 AM

## 2013-07-03 ENCOUNTER — Ambulatory Visit (INDEPENDENT_AMBULATORY_CARE_PROVIDER_SITE_OTHER): Payer: Medicare Other | Admitting: Family Medicine

## 2013-07-03 VITALS — BP 140/64 | HR 83 | Temp 98.4°F | Ht 63.0 in | Wt 203.0 lb

## 2013-07-03 DIAGNOSIS — E039 Hypothyroidism, unspecified: Secondary | ICD-10-CM

## 2013-07-03 DIAGNOSIS — E785 Hyperlipidemia, unspecified: Secondary | ICD-10-CM

## 2013-07-03 DIAGNOSIS — I10 Essential (primary) hypertension: Secondary | ICD-10-CM

## 2013-07-03 DIAGNOSIS — R269 Unspecified abnormalities of gait and mobility: Secondary | ICD-10-CM

## 2013-07-03 DIAGNOSIS — E119 Type 2 diabetes mellitus without complications: Secondary | ICD-10-CM

## 2013-07-03 DIAGNOSIS — C7951 Secondary malignant neoplasm of bone: Secondary | ICD-10-CM

## 2013-07-03 DIAGNOSIS — C7952 Secondary malignant neoplasm of bone marrow: Secondary | ICD-10-CM

## 2013-07-03 DIAGNOSIS — E441 Mild protein-calorie malnutrition: Secondary | ICD-10-CM

## 2013-07-03 DIAGNOSIS — R2681 Unsteadiness on feet: Secondary | ICD-10-CM

## 2013-07-03 LAB — LIPID PANEL
Cholesterol: 130 mg/dL (ref 0–200)
HDL: 22 mg/dL — ABNORMAL LOW (ref >39)
LDL Cholesterol: 80 mg/dL (ref 0–99)
TRIGLYCERIDES: 142 mg/dL (ref <150)
Total CHOL/HDL Ratio: 5.9 Ratio
VLDL: 28 mg/dL (ref 0–40)

## 2013-07-03 NOTE — Progress Notes (Signed)
Pre visit review using our clinic review tool, if applicable. No additional management support is needed unless otherwise documented below in the visit note. 

## 2013-07-03 NOTE — Patient Instructions (Signed)

## 2013-07-04 ENCOUNTER — Telehealth: Payer: Self-pay | Admitting: Family Medicine

## 2013-07-04 ENCOUNTER — Encounter: Payer: Self-pay | Admitting: Family Medicine

## 2013-07-04 LAB — TSH: TSH: 0.045 u[IU]/mL — AB (ref 0.350–4.500)

## 2013-07-04 NOTE — Telephone Encounter (Signed)
Relevant patient education mailed to patient.  

## 2013-07-05 ENCOUNTER — Observation Stay (HOSPITAL_COMMUNITY): Payer: Medicare Other

## 2013-07-05 ENCOUNTER — Inpatient Hospital Stay (HOSPITAL_COMMUNITY)
Admission: EM | Admit: 2013-07-05 | Discharge: 2013-07-08 | DRG: 287 | Disposition: A | Discharge status: Home / Self Care | Payer: Medicare Other | Attending: Internal Medicine | Admitting: Internal Medicine

## 2013-07-05 ENCOUNTER — Encounter (HOSPITAL_COMMUNITY): Payer: Self-pay | Admitting: Emergency Medicine

## 2013-07-05 ENCOUNTER — Emergency Department (HOSPITAL_COMMUNITY): Payer: Medicare Other

## 2013-07-05 DIAGNOSIS — E782 Mixed hyperlipidemia: Secondary | ICD-10-CM | POA: Diagnosis present

## 2013-07-05 DIAGNOSIS — I200 Unstable angina: Secondary | ICD-10-CM | POA: Diagnosis present

## 2013-07-05 DIAGNOSIS — Z87891 Personal history of nicotine dependence: Secondary | ICD-10-CM

## 2013-07-05 DIAGNOSIS — I2582 Chronic total occlusion of coronary artery: Secondary | ICD-10-CM | POA: Diagnosis present

## 2013-07-05 DIAGNOSIS — E785 Hyperlipidemia, unspecified: Secondary | ICD-10-CM | POA: Diagnosis present

## 2013-07-05 DIAGNOSIS — D630 Anemia in neoplastic disease: Secondary | ICD-10-CM | POA: Diagnosis present

## 2013-07-05 DIAGNOSIS — R079 Chest pain, unspecified: Secondary | ICD-10-CM | POA: Diagnosis present

## 2013-07-05 DIAGNOSIS — N039 Chronic nephritic syndrome with unspecified morphologic changes: Secondary | ICD-10-CM

## 2013-07-05 DIAGNOSIS — M358 Other specified systemic involvement of connective tissue: Secondary | ICD-10-CM

## 2013-07-05 DIAGNOSIS — E119 Type 2 diabetes mellitus without complications: Secondary | ICD-10-CM | POA: Diagnosis present

## 2013-07-05 DIAGNOSIS — K219 Gastro-esophageal reflux disease without esophagitis: Secondary | ICD-10-CM | POA: Diagnosis present

## 2013-07-05 DIAGNOSIS — F329 Major depressive disorder, single episode, unspecified: Secondary | ICD-10-CM

## 2013-07-05 DIAGNOSIS — M3589 Other specified systemic involvement of connective tissue: Secondary | ICD-10-CM

## 2013-07-05 DIAGNOSIS — C7952 Secondary malignant neoplasm of bone marrow: Secondary | ICD-10-CM

## 2013-07-05 DIAGNOSIS — M8708 Idiopathic aseptic necrosis of bone, other site: Secondary | ICD-10-CM

## 2013-07-05 DIAGNOSIS — M879 Osteonecrosis, unspecified: Secondary | ICD-10-CM

## 2013-07-05 DIAGNOSIS — I25119 Atherosclerotic heart disease of native coronary artery with unspecified angina pectoris: Secondary | ICD-10-CM

## 2013-07-05 DIAGNOSIS — Z7982 Long term (current) use of aspirin: Secondary | ICD-10-CM

## 2013-07-05 DIAGNOSIS — R609 Edema, unspecified: Secondary | ICD-10-CM

## 2013-07-05 DIAGNOSIS — Z7902 Long term (current) use of antithrombotics/antiplatelets: Secondary | ICD-10-CM

## 2013-07-05 DIAGNOSIS — F3289 Other specified depressive episodes: Secondary | ICD-10-CM

## 2013-07-05 DIAGNOSIS — Z23 Encounter for immunization: Secondary | ICD-10-CM

## 2013-07-05 DIAGNOSIS — I10 Essential (primary) hypertension: Secondary | ICD-10-CM | POA: Diagnosis not present

## 2013-07-05 DIAGNOSIS — I2510 Atherosclerotic heart disease of native coronary artery without angina pectoris: Principal | ICD-10-CM | POA: Diagnosis present

## 2013-07-05 DIAGNOSIS — N189 Chronic kidney disease, unspecified: Secondary | ICD-10-CM

## 2013-07-05 DIAGNOSIS — Z923 Personal history of irradiation: Secondary | ICD-10-CM

## 2013-07-05 DIAGNOSIS — D631 Anemia in chronic kidney disease: Secondary | ICD-10-CM

## 2013-07-05 DIAGNOSIS — Z853 Personal history of malignant neoplasm of breast: Secondary | ICD-10-CM

## 2013-07-05 DIAGNOSIS — I25118 Atherosclerotic heart disease of native coronary artery with other forms of angina pectoris: Secondary | ICD-10-CM

## 2013-07-05 DIAGNOSIS — Z794 Long term (current) use of insulin: Secondary | ICD-10-CM

## 2013-07-05 DIAGNOSIS — E875 Hyperkalemia: Secondary | ICD-10-CM

## 2013-07-05 DIAGNOSIS — Z79899 Other long term (current) drug therapy: Secondary | ICD-10-CM

## 2013-07-05 DIAGNOSIS — I6529 Occlusion and stenosis of unspecified carotid artery: Secondary | ICD-10-CM | POA: Diagnosis present

## 2013-07-05 DIAGNOSIS — E039 Hypothyroidism, unspecified: Secondary | ICD-10-CM | POA: Diagnosis present

## 2013-07-05 DIAGNOSIS — C7951 Secondary malignant neoplasm of bone: Secondary | ICD-10-CM

## 2013-07-05 DIAGNOSIS — M1990 Unspecified osteoarthritis, unspecified site: Secondary | ICD-10-CM

## 2013-07-05 DIAGNOSIS — R931 Abnormal findings on diagnostic imaging of heart and coronary circulation: Secondary | ICD-10-CM

## 2013-07-05 DIAGNOSIS — C50919 Malignant neoplasm of unspecified site of unspecified female breast: Secondary | ICD-10-CM | POA: Diagnosis present

## 2013-07-05 DIAGNOSIS — D649 Anemia, unspecified: Secondary | ICD-10-CM

## 2013-07-05 DIAGNOSIS — R2681 Unsteadiness on feet: Secondary | ICD-10-CM

## 2013-07-05 LAB — CBC WITH DIFFERENTIAL/PLATELET
BASOS PCT: 0 % (ref 0–1)
Basophils Absolute: 0 10*3/uL (ref 0.0–0.1)
Eosinophils Absolute: 0.3 10*3/uL (ref 0.0–0.7)
Eosinophils Relative: 5 % (ref 0–5)
HEMATOCRIT: 30.9 % — AB (ref 36.0–46.0)
Hemoglobin: 9.6 g/dL — ABNORMAL LOW (ref 12.0–15.0)
Lymphocytes Relative: 31 % (ref 12–46)
Lymphs Abs: 1.7 10*3/uL (ref 0.7–4.0)
MCH: 28.1 pg (ref 26.0–34.0)
MCHC: 31.1 g/dL (ref 30.0–36.0)
MCV: 90.4 fL (ref 78.0–100.0)
MONO ABS: 0.6 10*3/uL (ref 0.1–1.0)
MONOS PCT: 11 % (ref 3–12)
NEUTROS ABS: 2.9 10*3/uL (ref 1.7–7.7)
Neutrophils Relative %: 53 % (ref 43–77)
Platelets: 273 10*3/uL (ref 150–400)
RBC: 3.42 MIL/uL — ABNORMAL LOW (ref 3.87–5.11)
RDW: 15.3 % (ref 11.5–15.5)
WBC: 5.4 10*3/uL (ref 4.0–10.5)

## 2013-07-05 LAB — COMPREHENSIVE METABOLIC PANEL
ALBUMIN: 3.1 g/dL — AB (ref 3.5–5.2)
ALT: 8 U/L (ref 0–35)
AST: 14 U/L (ref 0–37)
Alkaline Phosphatase: 125 U/L — ABNORMAL HIGH (ref 39–117)
BUN: 31 mg/dL — ABNORMAL HIGH (ref 6–23)
CO2: 18 mEq/L — ABNORMAL LOW (ref 19–32)
CREATININE: 1.05 mg/dL (ref 0.50–1.10)
Calcium: 9.4 mg/dL (ref 8.4–10.5)
Chloride: 103 mEq/L (ref 96–112)
GFR, EST AFRICAN AMERICAN: 61 mL/min — AB (ref >90)
GFR, EST NON AFRICAN AMERICAN: 53 mL/min — AB (ref >90)
Glucose, Bld: 124 mg/dL — ABNORMAL HIGH (ref 70–99)
Potassium: 4.5 mEq/L (ref 3.7–5.3)
Sodium: 138 mEq/L (ref 137–147)
Total Bilirubin: 0.3 mg/dL (ref 0.3–1.2)
Total Protein: 6.8 g/dL (ref 6.0–8.3)

## 2013-07-05 LAB — CBC
HEMATOCRIT: 35 % — AB (ref 36.0–46.0)
Hemoglobin: 10.7 g/dL — ABNORMAL LOW (ref 12.0–15.0)
MCH: 27.9 pg (ref 26.0–34.0)
MCHC: 30.6 g/dL (ref 30.0–36.0)
MCV: 91.4 fL (ref 78.0–100.0)
Platelets: 278 10*3/uL (ref 150–400)
RBC: 3.83 MIL/uL — ABNORMAL LOW (ref 3.87–5.11)
RDW: 15.3 % (ref 11.5–15.5)
WBC: 6.4 10*3/uL (ref 4.0–10.5)

## 2013-07-05 LAB — CREATININE, SERUM
Creatinine, Ser: 0.86 mg/dL (ref 0.50–1.10)
GFR calc non Af Amer: 67 mL/min — ABNORMAL LOW (ref >90)
GFR, EST AFRICAN AMERICAN: 78 mL/min — AB (ref >90)

## 2013-07-05 LAB — GLUCOSE, CAPILLARY
GLUCOSE-CAPILLARY: 88 mg/dL (ref 70–99)
GLUCOSE-CAPILLARY: 130 mg/dL — AB (ref 70–99)
GLUCOSE-CAPILLARY: 185 mg/dL — AB (ref 70–99)

## 2013-07-05 LAB — TROPONIN I
Troponin I: <0.3 ng/mL (ref <0.30)
Troponin I: <0.3 ng/mL (ref <0.30)
Troponin I: <0.3 ng/mL (ref <0.30)

## 2013-07-05 LAB — D-DIMER, QUANTITATIVE (NOT AT ARMC): D DIMER QUANT: 1.28 ug{FEU}/mL — AB (ref 0.00–0.48)

## 2013-07-05 LAB — HEPARIN LEVEL (UNFRACTIONATED): Heparin Unfractionated: <0.1 IU/mL — ABNORMAL LOW (ref 0.30–0.70)

## 2013-07-05 MED ORDER — CARVEDILOL 12.5 MG PO TABS
12.5000 mg | ORAL_TABLET | Freq: Two times a day (BID) | ORAL | Status: DC
  Administered 2013-07-05 – 2013-07-06 (×2): 12.5 mg via ORAL
  Filled 2013-07-05 (×5): qty 1

## 2013-07-05 MED ORDER — MORPHINE SULFATE ER 15 MG PO TBCR
30.0000 mg | EXTENDED_RELEASE_TABLET | Freq: Two times a day (BID) | ORAL | Status: DC
  Administered 2013-07-05 – 2013-07-08 (×6): 30 mg via ORAL
  Filled 2013-07-05 (×6): qty 2

## 2013-07-05 MED ORDER — ATORVASTATIN CALCIUM 40 MG PO TABS
40.0000 mg | ORAL_TABLET | Freq: Every day | ORAL | Status: DC
  Administered 2013-07-05 – 2013-07-08 (×4): 40 mg via ORAL
  Filled 2013-07-05 (×4): qty 1

## 2013-07-05 MED ORDER — SODIUM CHLORIDE 0.9 % IV BOLUS (SEPSIS)
1000.0000 mL | Freq: Once | INTRAVENOUS | Status: AC
  Administered 2013-07-05: 1000 mL via INTRAVENOUS

## 2013-07-05 MED ORDER — LOSARTAN POTASSIUM 50 MG PO TABS
50.0000 mg | ORAL_TABLET | Freq: Every day | ORAL | Status: DC
  Administered 2013-07-05 – 2013-07-08 (×4): 50 mg via ORAL
  Filled 2013-07-05 (×4): qty 1

## 2013-07-05 MED ORDER — LEVOTHYROXINE SODIUM 75 MCG PO TABS
75.0000 ug | ORAL_TABLET | ORAL | Status: DC
  Administered 2013-07-07: 75 ug via ORAL
  Filled 2013-07-05: qty 1

## 2013-07-05 MED ORDER — INSULIN GLARGINE 100 UNIT/ML ~~LOC~~ SOLN
60.0000 [IU] | Freq: Every day | SUBCUTANEOUS | Status: DC
  Administered 2013-07-05: 60 [IU] via SUBCUTANEOUS
  Administered 2013-07-06: 30 [IU] via SUBCUTANEOUS
  Filled 2013-07-05 (×3): qty 0.6

## 2013-07-05 MED ORDER — NITROGLYCERIN 2 % TD OINT: 0.5000 [in_us] | TOPICAL_OINTMENT | Freq: Once | TRANSDERMAL | Status: DC

## 2013-07-05 MED ORDER — HYDROCHLOROTHIAZIDE 12.5 MG PO CAPS
12.5000 mg | ORAL_CAPSULE | Freq: Every day | ORAL | Status: DC
  Administered 2013-07-05 – 2013-07-06 (×2): 12.5 mg via ORAL
  Filled 2013-07-05 (×2): qty 1

## 2013-07-05 MED ORDER — MORPHINE SULFATE 2 MG/ML IJ SOLN: 2.0000 mg | INTRAMUSCULAR | Status: DC | PRN

## 2013-07-05 MED ORDER — POLYETHYLENE GLYCOL 3350 17 G PO PACK
17.0000 g | PACK | Freq: Every day | ORAL | Status: DC | PRN
  Filled 2013-07-05: qty 1

## 2013-07-05 MED ORDER — HEPARIN (PORCINE) IN NACL 100-0.45 UNIT/ML-% IJ SOLN
1300.0000 [IU]/h | INTRAMUSCULAR | Status: DC
  Administered 2013-07-05: 900 [IU]/h via INTRAVENOUS
  Administered 2013-07-06: 1300 [IU]/h via INTRAVENOUS
  Filled 2013-07-05 (×2): qty 250

## 2013-07-05 MED ORDER — ENOXAPARIN SODIUM 40 MG/0.4ML ~~LOC~~ SOLN
40.0000 mg | SUBCUTANEOUS | Status: DC
Start: 2013-07-05 — End: 2013-07-05

## 2013-07-05 MED ORDER — HEPARIN BOLUS VIA INFUSION
3000.0000 [IU] | Freq: Once | INTRAVENOUS | Status: AC
  Administered 2013-07-05: 3000 [IU] via INTRAVENOUS
  Filled 2013-07-05: qty 3000

## 2013-07-05 MED ORDER — GI COCKTAIL ~~LOC~~: 30.0000 mL | Freq: Four times a day (QID) | ORAL | Status: DC | PRN

## 2013-07-05 MED ORDER — MORPHINE SULFATE 2 MG/ML IJ SOLN
1.0000 mg | INTRAMUSCULAR | Status: DC | PRN
Start: 2013-07-05 — End: 2013-07-05

## 2013-07-05 MED ORDER — IOHEXOL 350 MG/ML SOLN
80.0000 mL | Freq: Once | INTRAVENOUS | Status: AC | PRN
  Administered 2013-07-05: 80 mL via INTRAVENOUS

## 2013-07-05 MED ORDER — NITROGLYCERIN 0.4 MG SL SUBL
0.4000 mg | SUBLINGUAL_TABLET | SUBLINGUAL | Status: DC | PRN
  Administered 2013-07-06 (×2): 0.4 mg via SUBLINGUAL

## 2013-07-05 MED ORDER — SODIUM CHLORIDE 0.9 % IV SOLN
INTRAVENOUS | Status: DC
  Administered 2013-07-05 – 2013-07-06 (×2): 1000 mL via INTRAVENOUS

## 2013-07-05 MED ORDER — NITROGLYCERIN 2 % TD OINT: 1.0000 [in_us] | TOPICAL_OINTMENT | Freq: Once | TRANSDERMAL | Status: DC

## 2013-07-05 MED ORDER — ASPIRIN 81 MG PO CHEW
81.0000 mg | CHEWABLE_TABLET | Freq: Every day | ORAL | Status: DC
  Administered 2013-07-05 – 2013-07-08 (×3): 81 mg via ORAL
  Filled 2013-07-05 (×4): qty 1

## 2013-07-05 MED ORDER — LEVOTHYROXINE SODIUM 200 MCG PO TABS
200.0000 ug | ORAL_TABLET | Freq: Every day | ORAL | Status: DC
  Administered 2013-07-06 – 2013-07-08 (×3): 200 ug via ORAL
  Filled 2013-07-05 (×4): qty 1

## 2013-07-05 MED ORDER — CLOPIDOGREL BISULFATE 75 MG PO TABS
75.0000 mg | ORAL_TABLET | Freq: Every day | ORAL | Status: DC
  Administered 2013-07-05 – 2013-07-08 (×4): 75 mg via ORAL
  Filled 2013-07-05 (×4): qty 1

## 2013-07-05 NOTE — Consult Note (Signed)
Name: Brittney Cunningham is a 73 y.o. female Admit date: 07/05/2013 Referring Physician:  [facility] Primary Physician:  Penni Homans, MD Primary Cardiologist:  Peter Martinique, MD  Reason for Consultation:   Chest Pain  ASSESSMENT: 1. Possible unstable angina pectoris 2. CAD with prior documentation of RCA occlusion by cath 2011 3. Metastatic breast cancer to bone 4. Carotid artery disease with prior  5. Diabetes mellitus, II 6. Hypertension 7. Hyperlipidemia  PLAN:  1. IV heparin 2. Myocardial perfusion study to assess risk. May need cath if moderate to high risk and/or recurring symptoms. 3. Aspirin 4. 2 D doppler echo to assess pericardial space   HPI:  73 year old awakened by chest discomfort described as tight/heavy with associated dyspnea. There is no radiation, pallor, or diaphoresis. NTG by EMS lead to dramatic improvement. Since then, the discomfort has been waxing and waning. Never before experienced this type discomfort. No relatively comfortable.  PMH:   Past Medical History  Diagnosis Date  . Anemia   . Depression   . Diabetes mellitus type II   . GERD (gastroesophageal reflux disease)   . Hyperlipidemia   . Hypothyroidism   . Osteoarthritis   . Carotid artery stenosis     bilateral  . CAD (coronary artery disease)   . OSA (obstructive sleep apnea)   . Breast cancer     right breast cancer-invasive  ductal carcinoma (StageIV)  . Hypertension   . Anxiety   . Carotid artery occlusion   . Unspecified constipation 03/15/2012  . Bursitis   . Collagen vascular disease   . History of radiation therapy 10/31/12-11/13/12    rt&lt humerous 30Gy/43fx    PSH:   Past Surgical History  Procedure Laterality Date  . Incisional breast biopsy      remoted left breast biopsy  . Cesarean section    . Other surgical history      GYN surgery  . Knee surgery      right knee surgery  . Carotid endarterectomy      right carotid  . Port-a-cath removal    .  Modified mastectomy      Right breast  . Breast surgery    . Mastectomy    . Carotid stent      left   Allergies:  Chlorhexidine; Adhesive; and Codeine Prior to Admit Meds:   (Not in a hospital admission) Fam HX:    Family History  Problem Relation Age of Onset  . Arthritis    . Coronary artery disease      first degree relative  . Stroke      first degree relative  . Diabetes Mother   . Heart disease Mother   . Hypertension Mother   . Cancer Mother   . Deep vein thrombosis Mother   . Colon cancer Neg Hx   . Stomach cancer Neg Hx   . Cancer Father     Pancreatic cancer  . Breast cancer Paternal Aunt 56   Social HX:    History   Social History  . Marital Status: Married    Spouse Name: N/A    Number of Children: 3  . Years of Education: N/A   Occupational History  . 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 Smoking status: Former Smoker -- 1.00 packs/day for 20 years    Types: Cigarettes    Quit date: 01/31/1988  . Smokeless tobacco: Never Used  .  Alcohol Use: No  . Drug Use: No  . Sexual Activity: Not Currently    Birth Control/ Protection: Post-menopausal   Other Topics Concern  . Not on file   Social History Narrative   Retired-ran a daycare facility for 40 years.   Married- 18 years    2 sons    1 daughter - Marine scientist midwife   Former smoker-quit in [place].      Review of Systems: Has reflux but current presentation feels different. Has occasional palpitations. No edema. No transient neuro complaints.  Physical Exam: Blood pressure 189/58, pulse 83, temperature 98.7 F (37.1 C), temperature source Oral, resp. rate 14, height 5' 2.5" (1.588 m), weight 202 lb (91.627 kg), last menstrual period 03/13/2012, SpO2 100.00%. Weight change:    Lying comfortably on gurney. No distress Neck veins are flat Chest is clear anterior and posterior. Cardiac exam reveals no rub or murmur of significance. Abdomen is soft and there is no  tenderness, Extremities reveal trace bilateral edema Neuro exam is intact  Labs: Lab Results  Component Value Date   WBC 5.4 07/05/2013   HGB 9.6* 07/05/2013   HCT 30.9* 07/05/2013   MCV 90.4 07/05/2013   PLT 273 07/05/2013    Recent Labs Lab 07/05/13 0810  NA 138  K 4.5  CL 103  CO2 18*  BUN 31*  CREATININE 1.05  CALCIUM 9.4  PROT 6.8  BILITOT 0.3  ALKPHOS 125*  ALT 8  AST 14  GLUCOSE 124*   No results found for this basename: PTT   Lab Results  Component Value Date   INR 1.08 07/19/2009   INR 1.04 07/06/2009   INR 1.14 05/26/2009   Lab Results  Component Value Date   CKTOTAL 90 03/15/2011   CKMB 2.4 03/15/2011   TROPONINI <0.30 07/05/2013     Lab Results  Component Value Date   CHOL 130 07/03/2013   CHOL 258* 06/26/2012   CHOL 160 05/04/2011   Lab Results  Component Value Date   HDL 22* 07/03/2013   HDL 29* 06/26/2012   HDL 27* 05/04/2011   Lab Results  Component Value Date   LDLCALC 80 07/03/2013   LDLCALC 164* 06/26/2012   LDLCALC 103* 05/04/2011   Lab Results  Component Value Date   TRIG 142 07/03/2013   TRIG 326* 06/26/2012   TRIG 151* 05/04/2011   Lab Results  Component Value Date   CHOLHDL 5.9 07/03/2013   CHOLHDL 8.9 06/26/2012   CHOLHDL 5.9 05/04/2011   No results found for this basename: LDLDIRECT      Radiology:  Dg Chest 2 View  07/05/2013   CLINICAL DATA:  Chest pain  EXAM: CHEST  2 VIEW  COMPARISON:  PET-CT dated 10/15/2012  FINDINGS: Chronic interstitial markings. Superimposed mild patchy opacity in the right mid lung, possibly reflecting pneumonia. Suspected small right pleural effusion with associated right lower lobe opacity, likely atelectasis.  The heart is normal in size.  Right chest port.  Widespread sclerotic osseous metastases.  IMPRESSION: Mild patchy opacity in the right mid lung, possibly reflecting pneumonia in the appropriate clinical setting. Follow-up chest radiographs are suggested to document clearance. In the absence of infectious symptoms,  consider CT chest with contrast.  Suspected small right pleural effusion with associated right lower lobe opacity, likely atelectasis.  Widespread sclerotic osseous metastases.   Electronically Signed   By: Julian Hy M.D.   On: 07/05/2013 10:35    EKG:  Normal sinus with vertical axis.  Belva Crome III 07/05/2013 12:03 PM

## 2013-07-05 NOTE — ED Provider Notes (Signed)
Shared service with midlevel provider. I have personally seen and examined the patient, providing direct face to face care, presenting with the chief complaint of chest pain. Physical exam findings include normal cardiovascular exam. Plan will be to admit for atypical chest pain, given CAD risk factors and response to nitro. I have reviewed the nursing documentation on past medical history, family history, and social history. Chest pain free during my evaluation.    Varney Biles, MD 07/05/13 [phone]

## 2013-07-05 NOTE — H&P (Addendum)
PATIENT DETAILS Name: Brittney Cunningham Age: 73 y.o. Sex: female Date of Birth: [birth year] Admit Date: 07/05/2013 LDJ:[id number], Brittney Levine, MD   CHIEF COMPLAINT:  Chest Pain-2 episodes this am  HPI: Brittney Cunningham is a 73 y.o. female with a Past Medical History of known coronary artery disease, carotid artery stenosis status post right carotid endarterectomy and stenting in the left carotid, diabetes, hypertension, dyslipidemia, stage IV breast cancer who presents today with the above noted complaint. patient claims that she woke up sometime early this morning with chest pain that she describes as heaviness, she does not know exactly what time this occurred, however this was associated with nausea, shortness of breath. She claimed the pain slowly eased off after a few minutes and she went back to sleep only to be awoken around 5:54 this a.m. with another episode of chest pain. She describes this episode as heaviness, and was again associated with nausea and shortness of breath. She thinks that the pain radiated to her back. Since the pain was persistent, she was brought to the emergency room, and I was asked to admit this patient for further evaluation and treatment  Currently she is chest pain-free. She no longer has any shortness of breath.  She denies any history of cough or fever. There is no history of headache, abdominal pain, vomiting, or diarrhea.   ALLERGIES:   Allergies  Allergen Reactions  . Chlorhexidine Hives, Itching and Rash    This was most likely a CONTACT DERMATITIS versus true systemic allergic reaction  . Adhesive [Tape] Hives  . Codeine Swelling    PAST MEDICAL HISTORY: Past Medical History  Diagnosis Date  . Anemia   . Depression   . Diabetes mellitus type II   . GERD (gastroesophageal reflux disease)   . Hyperlipidemia   . Hypothyroidism   . Osteoarthritis   . Carotid artery stenosis     bilateral  . CAD (coronary artery disease)   . OSA (obstructive  sleep apnea)   . Breast cancer     right breast cancer-invasive  ductal carcinoma (StageIV)  . Hypertension   . Anxiety   . Carotid artery occlusion   . Unspecified constipation 03/15/2012  . Bursitis   . Collagen vascular disease   . History of radiation therapy 10/31/12-11/13/12    rt&lt humerous 30Gy/64f    PAST SURGICAL HISTORY: Past Surgical History  Procedure Laterality Date  . Incisional breast biopsy      remoted left breast biopsy  . Cesarean section    . Other surgical history      GYN surgery  . Knee surgery      right knee surgery  . Carotid endarterectomy      right carotid  . Port-a-cath removal    . Modified mastectomy      Right breast  . Breast surgery    . Mastectomy    . Carotid stent      left    MEDICATIONS AT HOME: Prior to Admission medications   Medication Sig Start Date End Date Taking? Authorizing Provider  aspirin 81 MG chewable tablet Chew 81 mg by mouth daily.   Yes Historical Provider, MD  atorvastatin (LIPITOR) 40 MG tablet Take 1 tablet (40 mg total) by mouth daily. 06/09/13  Yes SMosie Lukes MD  Blood Glucose Monitoring Suppl (ACCU-CHEK NANO SMARTVIEW) W/DEVICE KIT Use to check blood sugar twice a day. DX 250.00 03/28/13  Yes SMosie Lukes MD  carvedilol (COREG) 12.5 MG tablet Take 1 tablet (12.5 mg total) by mouth 2 (two) times daily with a meal. 06/10/13  Yes Peter M Martinique, MD  chlorhexidine (PERIDEX) 0.12 % solution Rinse for 90  seconds  with 15 mLs three times daily. Spit out excess. Do NOT swallow. 03/26/13  Yes Lenn Cal, DDS  clopidogrel (PLAVIX) 75 MG tablet Take 1 tablet (75 mg total) by mouth daily. 03/06/13  Yes Serafina Mitchell, MD  cyanocobalamin (,VITAMIN B-12,) 1000 MCG/ML injection Inject 1,000 mcg into the muscle every 30 (thirty) days. Last dose 03/09/2011   Yes Historical Provider, MD  darbepoetin (ARANESP) 200 MCG/0.4ML SOLN Inject 200 mcg into the skin once. Every 30 days   Yes Historical Provider, MD  fulvestrant  (FASLODEX) 250 MG/5ML injection Inject 500 mg into the muscle every 30 (thirty) days. One injection each buttock over 1-2 minutes. Warm prior to use.   Yes Historical Provider, MD  glucose blood (ACCU-CHEK AVIVA) test strip Use as instructed to check blood sugar twice a day  Dx 250.00 03/28/13  Yes Mosie Lukes, MD  glycerin adult (GLYCERIN ADULT) 2 G SUPP Place 1 suppository rectally once as needed (constipation). 04/13/12  Yes Julianne Rice, MD  hydrochlorothiazide (MICROZIDE) 12.5 MG capsule Take 1 capsule (12.5 mg total) by mouth daily. 06/09/13  Yes Mosie Lukes, MD  ibuprofen (ADVIL) 200 MG tablet Take 200 mg by mouth 3 (three) times daily.     Yes Historical Provider, MD  insulin glargine (LANTUS) 100 UNIT/ML injection Inject 0.6 mLs (60 Units total) into the skin at bedtime. Pt wants vials. 04/04/13  Yes Mosie Lukes, MD  levothyroxine (SYNTHROID, LEVOTHROID) 200 MCG tablet Take 1 tablet (200 mcg total) by mouth daily before breakfast. 03/26/13  Yes Mosie Lukes, MD  levothyroxine (SYNTHROID, LEVOTHROID) 75 MCG tablet 54mg MOn  & Fridays with the 2041m daily 03/26/13  Yes StMosie LukesMD  losartan (COZAAR) 50 MG tablet Take 1 tablet (50 mg total) by mouth daily. 04/03/13  Yes StMosie LukesMD  metFORMIN (GLUCOPHAGE) 500 MG tablet Take 1 tablet (500 mg total) by mouth every morning. 04/03/13  Yes StMosie LukesMD  morphine (MS CONTIN) 30 MG 12 hr tablet Take 1 tablet (30 mg total) by mouth 2 (two) times daily. 06/09/13  Yes GuChauncey CruelMD  polyethylene glycol (MIRALAX / GLYCOLAX) packet Take 17 g by mouth daily as needed.   Yes Historical Provider, MD    FAMILY HISTORY: Family History  Problem Relation Age of Onset  . Arthritis    . Coronary artery disease      first degree relative  . Stroke      first degree relative  . Diabetes Mother   . Heart disease Mother   . Hypertension Mother   . Cancer Mother   . Deep vein thrombosis Mother   . Colon cancer Neg Hx   .  Stomach cancer Neg Hx   . Cancer Father     Pancreatic cancer  . Breast cancer Paternal Aunt [id number]  SOCIAL HISTORY:  reports that she quit smoking about 25 years ago. Her smoking use included Cigarettes. She has a 20 pack-year smoking history. She has never used smokeless tobacco. She reports that she does not drink alcohol or use illicit drugs.  REVIEW OF SYSTEMS:  Constitutional:   No  weight loss, night sweats,  Fevers, chills, fatigue.  HEENT:    No headaches, Difficulty swallowing,Tooth/dental  problems,Sore throat,  No sneezing, itching, ear ache, nasal congestion, post nasal drip,   Cardio-vascular: No  Orthopnea, PND, swelling in lower extremities, anasarca, dizziness, palpitations  GI:  No heartburn, indigestion, abdominal pain, nausea, vomiting, diarrhea, change in  bowel habits, loss of appetite  Resp:   No excess mucus, no productive cough, No non-productive cough,  No coughing up of blood.No change in color of mucus.No wheezing.No chest wall deformity  Skin:  no rash or lesions.  GU:  no dysuria, change in color of urine, no urgency or frequency.  No flank pain.  Musculoskeletal: No joint pain or swelling.  No decreased range of motion.  No back pain.  Psych: No change in mood or affect. No depression or anxiety.  No memory loss.   PHYSICAL EXAM: Blood pressure 189/58, pulse 83, temperature 98.7 F (37.1 C), temperature source Oral, resp. rate 14, height 5' 2.5" (1.588 m), weight 91.627 kg (202 lb), last menstrual period 03/13/2012, SpO2 100.00%.  General appearance :Awake, alert, not in any distress. Speech Clear. Not toxic Looking HEENT: Atraumatic and Normocephalic, pupils equally reactive to light and accomodation Neck: supple, no JVD. No cervical lymphadenopathy.  Chest:Good air entry bilaterally, no added sounds  CVS: S1 S2 regular, no murmurs.  Abdomen: Bowel sounds present, Non tender and not distended with no gaurding, rigidity or  rebound. Extremities: B/L Lower Ext shows no edema, both legs are warm to touch Neurology: Awake alert, and oriented X 3, CN II-XII intact, Non focal Skin:No Rash Wounds:N/A  LABS ON ADMISSION:   Recent Labs  07/05/13 0810  NA 138  K 4.5  CL 103  CO2 18*  GLUCOSE 124*  BUN 31*  CREATININE 1.05  CALCIUM 9.4    Recent Labs  07/05/13 0810  AST 14  ALT 8  ALKPHOS 125*  BILITOT 0.3  PROT 6.8  ALBUMIN 3.1*   No results found for this basename: LIPASE, AMYLASE,  in the last 72 hours  Recent Labs  07/05/13 0810  WBC 5.4  NEUTROABS 2.9  HGB 9.6*  HCT 30.9*  MCV 90.4  PLT 273    Recent Labs  07/05/13 0810  TROPONINI <0.30   No results found for this basename: DDIMER,  in the last 72 hours No components found with this basename: POCBNP,    RADIOLOGIC STUDIES ON ADMISSION: Dg Chest 2 View  07/05/2013   CLINICAL DATA:  Chest pain  EXAM: CHEST  2 VIEW  COMPARISON:  PET-CT dated 10/15/2012  FINDINGS: Chronic interstitial markings. Superimposed mild patchy opacity in the right mid lung, possibly reflecting pneumonia. Suspected small right pleural effusion with associated right lower lobe opacity, likely atelectasis.  The heart is normal in size.  Right chest port.  Widespread sclerotic osseous metastases.  IMPRESSION: Mild patchy opacity in the right mid lung, possibly reflecting pneumonia in the appropriate clinical setting. Follow-up chest radiographs are suggested to document clearance. In the absence of infectious symptoms, consider CT chest with contrast.  Suspected small right pleural effusion with associated right lower lobe opacity, likely atelectasis.  Widespread sclerotic osseous metastases.   Electronically Signed   By: Julian Hy M.D.   On: 07/05/2013 10:35     EKG: Independently reviewed. NSR  ASSESSMENT AND PLAN: Present on Admission:  . Chest pain - History of known coronary artery disease-cardiac catheterization in 2011 reviewed. Her chest has  some typical features, she will be admitted to a telemetry unit and cardiac enzymes will be cycled. Cardiology has been consulted, we'll  keep n.p.o. to cardiology evaluation completed. She is already on aspirin, Plavix, statin and beta blocker- will be continued. If she rules in, we will start her on therapeutic anticoagulation. - Chest x-ray shows potential pneumonia in the right lung-she has no clinical features consistent with pneumonia, for now monitor off antibiotics. Please note, her chest pain is retrosternal, suspect chest pain is not related to the opacity seen on the chest x-ray.  Marland Kitchen ANEMIA - Likely secondary to chronic disease-breast cancer, gets and darbepoetin infusion as an outpatient. Continue to monitor periodically.   Marland Kitchen CAROTID ARTERY STENOSIS - With history of right carotid endarterectomy, and stenting of the left carotid. Antiplatelet agents, statins will be continued.  . Breast cancer metastasized to bone - Outpatient follow up with oncology, reviewed recent oncology notes - disease he seems to be largely controlled.   Marland Kitchen DIABETES MELLITUS, TYPE II - Continue Lantus, place on SSI. Monitor CBGs and adjust medications accordingly.   Marland Kitchen GERD - Start PPI, as needed GI cocktail.   Marland Kitchen HYPERLIPIDEMIA - Continue statins.   Marland Kitchen HYPERTENSION - Continue usual antihypertensive medications.   Marland Kitchen HYPOTHYROIDISM - Continue levothyroxine.   Further plan will depend as patient's clinical course evolves and further radiologic and laboratory data become available. Patient will be monitored closely.   Above noted plan was discussed with patient/family, they were in agreement.   DVT Prophylaxis: Prophylactic Lovenox   Code Status: Full Code  Total time spent for admission equals 45 minutes.  Jonetta Osgood [facility] Pager [phone]  If 7PM-7AM, please contact night-coverage [email or web address] Password [email or web address] 07/05/2013, 11:16 AM  **Disclaimer: This note may have been  dictated with voice recognition software. Similar sounding words can inadvertently be transcribed and this note may contain transcription errors which may not have been corrected upon publication of note.**

## 2013-07-05 NOTE — ED Notes (Signed)
MD at bedside. 

## 2013-07-05 NOTE — ED Notes (Signed)
Cardiology at bedside.

## 2013-07-05 NOTE — ED Notes (Signed)
Pt from home, c/o cp w/ sob and nausea, radiating to back. Pt denies pain after 1 nitro from EMS. Pt received 324 asa at home. Pt states she still feels sob and is exhausted.

## 2013-07-05 NOTE — ED Notes (Signed)
Patient transported to CT 

## 2013-07-05 NOTE — Progress Notes (Signed)
ANTICOAGULATION CONSULT NOTE - Initial Consult  Pharmacy Consult for heparin Indication: chest pain/ACS  Allergies  Allergen Reactions  . Chlorhexidine Hives, Itching and Rash    This was most likely a CONTACT DERMATITIS versus true systemic allergic reaction  . Adhesive [Tape] Hives  . Codeine Swelling    Patient Measurements: Height: 5' 2.5" (158.8 cm) Weight: 198 lb 3.1 oz (89.9 kg) IBW/kg (Calculated) : 51.25 Heparin Dosing Weight: 75kg  Vital Signs: Temp: 98.3 F (36.8 C) (06/06 2000) Temp src: Oral (06/06 1642) BP: 152/53 mmHg (06/06 2000) Pulse Rate: 79 (06/06 2000)  Labs:  Recent Labs  07/05/13 0810 07/05/13 1600 07/05/13 2050  HGB 9.6* 10.7*  --   HCT 30.9* 35.0*  --   PLT 273 278  --   HEPARINUNFRC  --   --  <0.10*  CREATININE 1.05 0.86  --   TROPONINI <0.30 <0.30 <0.30    Estimated Creatinine Clearance: 64.1 ml/min (by C-G formula based on Cr of 0.86).   Medical History: Past Medical History  Diagnosis Date  . Anemia   . Depression   . Diabetes mellitus type II   . GERD (gastroesophageal reflux disease)   . Hyperlipidemia   . Hypothyroidism   . Osteoarthritis   . Carotid artery stenosis     bilateral  . CAD (coronary artery disease)   . OSA (obstructive sleep apnea)   . Breast cancer     right breast cancer-invasive  ductal carcinoma (StageIV)  . Hypertension   . Anxiety   . Carotid artery occlusion   . Unspecified constipation 03/15/2012  . Bursitis   . Collagen vascular disease   . History of radiation therapy 10/31/12-11/13/12    rt&lt humerous 30Gy/64fx     Assessment: 72 YOF brought in with chest discomfort and dyspnea. First troponin negative. Started on IV nitro, symptoms waxing and waning. Baseline Hgb 9.6, plts 273. She does have an elevated D-Dimer. She was on Plavix and aspirin at home, but no anticoagulation. 1st HL is undetectable at <0.1 no infusion problems. No infiltration.   Goal of Therapy:  Heparin level 0.3-0.7  units/ml Monitor platelets by anticoagulation protocol: Yes   Plan:  Heparin 3000 unit bolus and increase to 14 units/kg/hr Recheck HL in 6 hours.   Curlene Dolphin

## 2013-07-05 NOTE — Discharge Instructions (Signed)
Metformin and X-ray Contrast Studies °For some X-ray exams, a contrast dye is used. Contrast dye is a type of medicine used to make the X-ray image clearer. The contrast dye is given to the patient through a vein (intravenously). If you need to have this type of X-ray exam and you take a medication called metformin, your caregiver may have you stop taking metformin before the exam.  °LACTIC ACIDOSIS °In rare cases, a serious medical condition called lactic acidosis can develop in people who take metformin and receive contrast dye. The following conditions can increase the risk of this complication:  °· Kidney failure. °· Liver problems. °· Certain types of heart problems such as: °· Heart failure. °· Heart attack. °· Heart infection. °· Heart valve problems. °· Alcohol abuse. °If left untreated, lactic acidosis can lead to coma.  °SYMPTOMS OF LACTIC ACIDOSIS °Symptoms of lactic acidosis can include: °· Rapid breathing (hyperventilation). °· Neurologic symptoms such as: °· Headaches. °· Confusion. °· Dizziness. °· Excessive sweating. °· Feeling sick to your stomach (nauseous) or throwing up (vomiting). °AFTER THE X-RAY EXAM °· Stay well-hydrated. Drink fluids as instructed by your caregiver. °· If you have a risk of developing lactic acidosis, blood tests may be done to make sure your kidney function is okay. °· Metformin is usually stopped for 48 hours after the X-ray exam. Ask your caregiver when you can start taking metformin again. °SEEK MEDICAL CARE IF:  °· You have shortness of breath or difficulty breathing. °· You develop a headache that does not go away. °· You have nausea or vomiting. °· You urinate more than normal. °· You develop a skin rash and have: °· Redness. °· Swelling. °· Itching. °Document Released: 01/04/2009 Document Revised: 04/10/2011 Document Reviewed: 01/04/2009 °[employer]® Patient Information ©2014 [employer]. ° °

## 2013-07-05 NOTE — Progress Notes (Signed)
ANTICOAGULATION CONSULT NOTE - Initial Consult  Pharmacy Consult for heparin Indication: chest pain/ACS  Allergies  Allergen Reactions  . Chlorhexidine Hives, Itching and Rash    This was most likely a CONTACT DERMATITIS versus true systemic allergic reaction  . Adhesive [Tape] Hives  . Codeine Swelling    Patient Measurements: Height: 5' 2.5" (158.8 cm) Weight: 202 lb (91.627 kg) IBW/kg (Calculated) : 51.25 Heparin Dosing Weight: 75kg  Vital Signs: Temp: 98.7 F (37.1 C) (06/06 1007) Temp src: Oral (06/06 1007) BP: 189/58 mmHg (06/06 1007) Pulse Rate: 83 (06/06 0915)  Labs:  Recent Labs  07/05/13 0810  HGB 9.6*  HCT 30.9*  PLT 273  CREATININE 1.05  TROPONINI <0.30    Estimated Creatinine Clearance: 53 ml/min (by C-G formula based on Cr of 1.05).   Medical History: Past Medical History  Diagnosis Date  . Anemia   . Depression   . Diabetes mellitus type II   . GERD (gastroesophageal reflux disease)   . Hyperlipidemia   . Hypothyroidism   . Osteoarthritis   . Carotid artery stenosis     bilateral  . CAD (coronary artery disease)   . OSA (obstructive sleep apnea)   . Breast cancer     right breast cancer-invasive  ductal carcinoma (StageIV)  . Hypertension   . Anxiety   . Carotid artery occlusion   . Unspecified constipation 03/15/2012  . Bursitis   . Collagen vascular disease   . History of radiation therapy 10/31/12-11/13/12    rt&lt humerous 30Gy/41fx     Assessment: 72 YOF brought in with chest discomfort and dyspnea. First troponin negative. Started on IV nitro, symptoms waxing and waning. Baseline Hgb 9.6, plts 273. She does have an elevated D-Dimer. She was on Plavix and aspirin at home, but no anticoagulation.  Goal of Therapy:  Heparin level 0.3-0.7 units/ml Monitor platelets by anticoagulation protocol: Yes   Plan:  1. Heparin bolus with 3000 units IV x1, then start heparin drip at 900 units/hr 2. Heparin level in 8 hours 3. Daily HL  and CCB 4. Follow for s/s bleeding 5. Follow cath plans  Maleik Vanderzee D. Leone Putman, PharmD, BCPS Clinical Pharmacist Pager: [phone] 07/05/2013 1:07 PM

## 2013-07-05 NOTE — ED Provider Notes (Signed)
CSN: [id number]     Arrival date & time 07/05/13  9485 History   First MD Initiated Contact with Patient 07/05/13 [phone]     Chief Complaint  Patient presents with  . Chest Pain     (Consider location/radiation/quality/duration/timing/severity/associated sxs/prior Treatment) The history is provided by the patient. No language interpreter was used.  Brittney Cunningham is a 73 y/o F with PMhx of anemia, depression, DM, GERD, HLD, OA, CAD, OSA, HTN, breast cancer diagnosed 4 years ago with a mastectomy with radiation therapy, collagen vascular disease, carotid stent placement presenting to the ED with chest pain that started earlier this morning. Patient reported that she woke up and started to have chest pain localized to the center of her chest described as a pressure sensation that lasted only a short period of time. Reported that she went back to bed - stated that at approximately 5:45 AM patient started to experience chest pain again and stated that it was localized to the center of her chest described as a pressure sensation. Reported that with these episodes of discomfort she had mild shortness of breath. Stated that some of the discomfort radiated towards her back. Reported episodes of nausea as well as weakness. Patient reported that she called 911, stated that she took her to 324 mg of aspirin chewed, while en route to the ED was given nitroglycerin. Denied chest pain at this point. Denied diaphoresis, vomiting, and difficulty breathing, numbness, tingling, blurred vision, sudden loss of vision, syncope.  PCP Dr. Charlett Blake   Past Medical History  Diagnosis Date  . Anemia   . Depression   . Diabetes mellitus type II   . GERD (gastroesophageal reflux disease)   . Hyperlipidemia   . Hypothyroidism   . Osteoarthritis   . Carotid artery stenosis     bilateral  . CAD (coronary artery disease)   . OSA (obstructive sleep apnea)   . Breast cancer     right breast cancer-invasive  ductal carcinoma  (StageIV)  . Hypertension   . Anxiety   . Carotid artery occlusion   . Unspecified constipation 03/15/2012  . Bursitis   . Collagen vascular disease   . History of radiation therapy 10/31/12-11/13/12    rt&lt humerous 30Gy/54fx   Past Surgical History  Procedure Laterality Date  . Incisional breast biopsy      remoted left breast biopsy  . Cesarean section    . Other surgical history      GYN surgery  . Knee surgery      right knee surgery  . Carotid endarterectomy      right carotid  . Port-a-cath removal    . Modified mastectomy      Right breast  . Breast surgery    . Mastectomy    . Carotid stent      left   Family History  Problem Relation Age of Onset  . Arthritis    . Coronary artery disease      first degree relative  . Stroke      first degree relative  . Diabetes Mother   . Heart disease Mother   . Hypertension Mother   . Cancer Mother   . Deep vein thrombosis Mother   . Colon cancer Neg Hx   . Stomach cancer Neg Hx   . Cancer Father     Pancreatic cancer  . Breast cancer Paternal Aunt 71   History  Substance Use Topics  . Smoking status: Former Smoker -- 1.00  packs/day for 20 years    Types: Cigarettes    Quit date: 01/31/1988  . Smokeless tobacco: Never Used  . Alcohol Use: No   OB History   Grav Para Term Preterm Abortions TAB SAB Ect Mult Living                 Review of Systems  Constitutional: Negative for fever and chills.  Respiratory: Negative for chest tightness and shortness of breath.   Cardiovascular: Positive for chest pain. Negative for leg swelling.  Gastrointestinal: Positive for nausea. Negative for abdominal pain.  Musculoskeletal: Negative for back pain and neck pain.  Neurological: Positive for weakness. Negative for dizziness.      Allergies  Chlorhexidine; Adhesive; and Codeine  Home Medications   Prior to Admission medications   Medication Sig Start Date End Date Taking? Authorizing Provider  aspirin 81 MG  chewable tablet Chew 81 mg by mouth daily.   Yes Historical Provider, MD  atorvastatin (LIPITOR) 40 MG tablet Take 1 tablet (40 mg total) by mouth daily. 06/09/13  Yes Bradd Canary, MD  Blood Glucose Monitoring Suppl (ACCU-CHEK NANO SMARTVIEW) W/DEVICE KIT Use to check blood sugar twice a day. DX 250.00 03/28/13  Yes Bradd Canary, MD  carvedilol (COREG) 12.5 MG tablet Take 1 tablet (12.5 mg total) by mouth 2 (two) times daily with a meal. 06/10/13  Yes Peter M [place], MD  chlorhexidine (PERIDEX) 0.12 % solution Rinse for 90  seconds  with 15 mLs three times daily. Spit out excess. Do NOT swallow. 03/26/13  Yes Charlynne Pander, DDS  clopidogrel (PLAVIX) 75 MG tablet Take 1 tablet (75 mg total) by mouth daily. 03/06/13  Yes Nada Libman, MD  cyanocobalamin (,VITAMIN B-12,) 1000 MCG/ML injection Inject 1,000 mcg into the muscle every 30 (thirty) days. Last dose 03/09/2011   Yes Historical Provider, MD  darbepoetin (ARANESP) 200 MCG/0.4ML SOLN Inject 200 mcg into the skin once. Every 30 days   Yes Historical Provider, MD  fulvestrant (FASLODEX) 250 MG/5ML injection Inject 500 mg into the muscle every 30 (thirty) days. One injection each buttock over 1-2 minutes. Warm prior to use.   Yes Historical Provider, MD  glucose blood (ACCU-CHEK AVIVA) test strip Use as instructed to check blood sugar twice a day  Dx 250.00 03/28/13  Yes Bradd Canary, MD  glycerin adult (GLYCERIN ADULT) 2 G SUPP Place 1 suppository rectally once as needed (constipation). 04/13/12  Yes Loren Racer, MD  hydrochlorothiazide (MICROZIDE) 12.5 MG capsule Take 1 capsule (12.5 mg total) by mouth daily. 06/09/13  Yes Bradd Canary, MD  ibuprofen (ADVIL) 200 MG tablet Take 200 mg by mouth 3 (three) times daily.     Yes Historical Provider, MD  insulin glargine (LANTUS) 100 UNIT/ML injection Inject 0.6 mLs (60 Units total) into the skin at bedtime. Pt wants vials. 04/04/13  Yes Bradd Canary, MD  levothyroxine (SYNTHROID, LEVOTHROID) 200  MCG tablet Take 1 tablet (200 mcg total) by mouth daily before breakfast. 03/26/13  Yes Bradd Canary, MD  levothyroxine (SYNTHROID, LEVOTHROID) 75 MCG tablet MOn  & Fridays with the daily 03/26/13  Yes Bradd Canary, MD  losartan (COZAAR) 50 MG tablet Take 1 tablet (50 mg total) by mouth daily. 04/03/13  Yes Bradd Canary, MD  metFORMIN (GLUCOPHAGE) 500 MG tablet Take 1 tablet (500 mg total) by mouth every morning. 04/03/13  Yes Bradd Canary, MD  morphine (MS CONTIN) 30 MG 12 hr  tablet Take 1 tablet (30 mg total) by mouth 2 (two) times daily. 06/09/13  Yes Chauncey Cruel, MD  polyethylene glycol (MIRALAX / GLYCOLAX) packet Take 17 g by mouth daily as needed.   Yes Historical Provider, MD   BP 189/58  Pulse 83  Temp(Src) 98.7 F (37.1 C) (Oral)  Resp 14  Ht 5' 2.5" (1.588 m)  Wt 202 lb (91.627 kg)  BMI 36.33 kg/m2  SpO2 100%  LMP 03/13/2012 Physical Exam  Nursing note and vitals reviewed. Constitutional: She is oriented to person, place, and time. She appears well-developed and well-nourished. No distress.  HENT:  Head: Normocephalic and atraumatic.  Mouth/Throat: Oropharynx is clear and moist. No oropharyngeal exudate.  Eyes: Conjunctivae and EOM are normal. Pupils are equal, round, and reactive to light. Right eye exhibits no discharge. Left eye exhibits no discharge.  Neck: Normal range of motion. Neck supple. No tracheal deviation present.  Cardiovascular: Normal rate, regular rhythm and normal heart sounds.  Exam reveals no friction rub.   No murmur heard. Cap refill < 3 seconds Negative swelling or pitting edema noted to the lower extremities bilaterally   Pulmonary/Chest: Effort normal and breath sounds normal. No respiratory distress. She has no wheezes. She has no rales. She exhibits no tenderness.  Musculoskeletal: Normal range of motion.  Full ROM to upper and lower extremities without difficulty noted, negative ataxia noted.  Lymphadenopathy:    She has no  cervical adenopathy.  Neurological: She is alert and oriented to person, place, and time. No cranial nerve deficit. She exhibits normal muscle tone. Coordination normal.  Cranial nerves III-XII grossly intact Strength 5+/5+ to upper and lower extremities bilaterally with resistance applied, equal distribution noted Equal grip strength  Negative facial drooping Negative slurred speech Negative aphasia Negative arm drift  Skin: She is diaphoretic.  Psychiatric:  Patient appears anxious    ED Course  Procedures (including critical care time)  10:33 AM This provider spoke with Dr. Sloan Leiter, [facility] - discussed case, history, labs in great detail. Patient to be admitted for Telemetry observation.   11:00 AM This provider spoke with the patient and as per patient reported that she is in remission from breast cancer, but reported that has bone cancer - still active. D-dimer will be ordered.   12:06 PM Cardiology assessing patient.   Results for orders placed during the hospital encounter of 07/05/13  CBC WITH DIFFERENTIAL      Result Value Ref Range   WBC 5.4  4.0 - 10.5 K/uL   RBC 3.42 (*) 3.87 - 5.11 MIL/uL   Hemoglobin 9.6 (*) 12.0 - 15.0 g/dL   HCT 30.9 (*) 36.0 - 46.0 %   MCV 90.4  78.0 - 100.0 fL   MCH 28.1  26.0 - 34.0 pg   MCHC 31.1  30.0 - 36.0 g/dL   RDW 15.3  11.5 - 15.5 %   Platelets 273  150 - 400 K/uL   Neutrophils Relative % 53  43 - 77 %   Neutro Abs 2.9  1.7 - 7.7 K/uL   Lymphocytes Relative 31  12 - 46 %   Lymphs Abs 1.7  0.7 - 4.0 K/uL   Monocytes Relative 11  3 - 12 %   Monocytes Absolute 0.6  0.1 - 1.0 K/uL   Eosinophils Relative 5  0 - 5 %   Eosinophils Absolute 0.3  0.0 - 0.7 K/uL   Basophils Relative 0  0 - 1 %   Basophils Absolute  0.0  0.0 - 0.1 K/uL  COMPREHENSIVE METABOLIC PANEL      Result Value Ref Range   Sodium 138  137 - 147 mEq/L   Potassium 4.5  3.7 - 5.3 mEq/L   Chloride 103  96 - 112 mEq/L   CO2 18 (*) 19 - 32 mEq/L   Glucose,  Bld 124 (*) 70 - 99 mg/dL   BUN 31 (*) 6 - 23 mg/dL   Creatinine, Ser 1.05  0.50 - 1.10 mg/dL   Calcium 9.4  8.4 - 10.5 mg/dL   Total Protein 6.8  6.0 - 8.3 g/dL   Albumin 3.1 (*) 3.5 - 5.2 g/dL   AST 14  0 - 37 U/L   ALT 8  0 - 35 U/L   Alkaline Phosphatase 125 (*) 39 - 117 U/L   Total Bilirubin 0.3  0.3 - 1.2 mg/dL   GFR calc non Af Amer 53 (*) >90 mL/min   GFR calc Af Amer 61 (*) >90 mL/min  TROPONIN I      Result Value Ref Range   Troponin I <0.30  <0.30 ng/mL  D-DIMER, QUANTITATIVE      Result Value Ref Range   D-Dimer, Quant 1.28 (*) 0.00 - 0.48 ug/mL-FEU    Labs Review Labs Reviewed  CBC WITH DIFFERENTIAL - Abnormal; Notable for the following:    RBC 3.42 (*)    Hemoglobin 9.6 (*)    HCT 30.9 (*)    All other components within normal limits  COMPREHENSIVE METABOLIC PANEL - Abnormal; Notable for the following:    CO2 18 (*)    Glucose, Bld 124 (*)    BUN 31 (*)    Albumin 3.1 (*)    Alkaline Phosphatase 125 (*)    GFR calc non Af Amer 53 (*)    GFR calc Af Amer 61 (*)    All other components within normal limits  D-DIMER, QUANTITATIVE - Abnormal; Notable for the following:    D-Dimer, Quant 1.28 (*)    All other components within normal limits  TROPONIN I    Imaging Review Dg Chest 2 View  07/05/2013   CLINICAL DATA:  Chest pain  EXAM: CHEST  2 VIEW  COMPARISON:  PET-CT dated 10/15/2012  FINDINGS: Chronic interstitial markings. Superimposed mild patchy opacity in the right mid lung, possibly reflecting pneumonia. Suspected small right pleural effusion with associated right lower lobe opacity, likely atelectasis.  The heart is normal in size.  Right chest port.  Widespread sclerotic osseous metastases.  IMPRESSION: Mild patchy opacity in the right mid lung, possibly reflecting pneumonia in the appropriate clinical setting. Follow-up chest radiographs are suggested to document clearance. In the absence of infectious symptoms, consider CT chest with contrast.  Suspected  small right pleural effusion with associated right lower lobe opacity, likely atelectasis.  Widespread sclerotic osseous metastases.   Electronically Signed   By: Julian Hy M.D.   On: 07/05/2013 10:35     EKG Interpretation   Date/Time:  Saturday July 05 2013 07:41:39 EDT Ventricular Rate:  74 PR Interval:  172 QRS Duration: 90 QT Interval:  386 QTC Calculation: 428 R Axis:   92 Text Interpretation:  Sinus rhythm Right axis deviation No acute ST  changes Confirmed by Kathrynn Humble, MD, ANKIT ([id number]) on 07/05/2013 8:49:16 AM      MDM   Final diagnoses:  Chest pain   Medications  nitroGLYCERIN (NITROGLYN) 2 % ointment 0.5 inch (0 inches Topical Hold 07/05/13 1043)  iohexol (OMNIPAQUE)  350 MG/ML injection 80 mL (not administered)  sodium chloride 0.9 % bolus 1,000 mL (1,000 mLs Intravenous New Bag/Given 07/05/13 0832)   Filed Vitals:   07/05/13 0845 07/05/13 0900 07/05/13 0915 07/05/13 1007  BP: 148/41 154/46 163/50 189/58  Pulse: 72 78 83   Temp:    98.7 F (37.1 C)  TempSrc:    Oral  Resp: $Remo'11 11 15 14  'aGTwo$ Height:      Weight:      SpO2: 95% 97% 96% 100%   This provider reviewed the patient's chart. Cardiac cath performed in early 04/2009 where single vessel obstructive disease was noted, normal LV function. 04/2009 noted right carotid stenosis with stent placement. Patient reported that 4 years ago Dr. [place] performed cardiac cath - this provider cannot find the note regarding this.  EKG noted normal sinus rhythm with a heart rate of 74 beats per minute-right axis deviation noted with no acute ST changes. Troponin negative elevation. D-dimer elevated at 1.28. CBC negative elevated white blood cell count-negative left shift or leukocytosis identified. Hemoglobin 9.6, hematocrit 30.9-patient has history of anemia and ranges anywhere from 9.6-10. CMP kidneys and liver functioning well. Elevated alkaline phosphatase of 125. Anion gap of 18 mEq per liter. CT angio negative for PE -  widespread metastatic disease redemonstrated, small pulmonary nodules in the lungs are noted bilaterally without change, mild to moderate cardiomegaly. HEART score 5. Discussed case with [facility] - patient to be admitted to Telemetry observation for cardiac rule out. Cardiology consulted and recommended patient to be started on Heparin - heparin per pharmacy ordered. Patient agreed to plan of care. Patient stable for transfer.   Jamse Mead, PA-C 07/05/13 1749

## 2013-07-05 NOTE — ED Notes (Signed)
PA at bedside.

## 2013-07-05 NOTE — ED Notes (Signed)
Patient transported to X-ray 

## 2013-07-06 ENCOUNTER — Observation Stay (HOSPITAL_COMMUNITY): Payer: Medicare Other

## 2013-07-06 ENCOUNTER — Encounter: Payer: Self-pay | Admitting: Family Medicine

## 2013-07-06 DIAGNOSIS — I059 Rheumatic mitral valve disease, unspecified: Secondary | ICD-10-CM

## 2013-07-06 DIAGNOSIS — R079 Chest pain, unspecified: Secondary | ICD-10-CM

## 2013-07-06 DIAGNOSIS — E119 Type 2 diabetes mellitus without complications: Secondary | ICD-10-CM

## 2013-07-06 DIAGNOSIS — I2510 Atherosclerotic heart disease of native coronary artery without angina pectoris: Principal | ICD-10-CM

## 2013-07-06 LAB — CBC
HCT: 33.6 % — ABNORMAL LOW (ref 36.0–46.0)
HEMOGLOBIN: 10.2 g/dL — AB (ref 12.0–15.0)
MCH: 27.8 pg (ref 26.0–34.0)
MCHC: 30.4 g/dL (ref 30.0–36.0)
MCV: 91.6 fL (ref 78.0–100.0)
Platelets: 263 10*3/uL (ref 150–400)
RBC: 3.67 MIL/uL — AB (ref 3.87–5.11)
RDW: 15.1 % (ref 11.5–15.5)
WBC: 6 10*3/uL (ref 4.0–10.5)

## 2013-07-06 LAB — GLUCOSE, CAPILLARY
GLUCOSE-CAPILLARY: 84 mg/dL (ref 70–99)
GLUCOSE-CAPILLARY: 147 mg/dL — AB (ref 70–99)
Glucose-Capillary: 135 mg/dL — ABNORMAL HIGH (ref 70–99)
Glucose-Capillary: 123 mg/dL — ABNORMAL HIGH (ref 70–99)

## 2013-07-06 LAB — HEPARIN LEVEL (UNFRACTIONATED)
HEPARIN UNFRACTIONATED: 0.36 [IU]/mL (ref 0.30–0.70)
Heparin Unfractionated: 0.17 IU/mL — ABNORMAL LOW (ref 0.30–0.70)
Heparin Unfractionated: 0.27 IU/mL — ABNORMAL LOW (ref 0.30–0.70)

## 2013-07-06 LAB — T4, FREE: FREE T4: 1.93 ng/dL — AB (ref 0.80–1.80)

## 2013-07-06 MED ORDER — ACETAMINOPHEN 325 MG PO TABS
650.0000 mg | ORAL_TABLET | Freq: Four times a day (QID) | ORAL | Status: DC | PRN
  Administered 2013-07-06 – 2013-07-08 (×2): 650 mg via ORAL
  Filled 2013-07-06 (×2): qty 2

## 2013-07-06 MED ORDER — REGADENOSON 0.4 MG/5ML IV SOLN
INTRAVENOUS | Status: AC
  Administered 2013-07-06: 0.4 mg
  Filled 2013-07-06: qty 5

## 2013-07-06 MED ORDER — TECHNETIUM TC 99M SESTAMIBI - CARDIOLITE
30.0000 | Freq: Once | INTRAVENOUS | Status: AC | PRN
  Administered 2013-07-06: 11:00:00 30 via INTRAVENOUS

## 2013-07-06 MED ORDER — CARVEDILOL 25 MG PO TABS
25.0000 mg | ORAL_TABLET | Freq: Two times a day (BID) | ORAL | Status: DC
  Administered 2013-07-06 – 2013-07-08 (×3): 25 mg via ORAL
  Filled 2013-07-06 (×6): qty 1

## 2013-07-06 MED ORDER — NITROGLYCERIN 0.4 MG SL SUBL
SUBLINGUAL_TABLET | SUBLINGUAL | Status: AC
  Filled 2013-07-06: qty 1

## 2013-07-06 MED ORDER — ASPIRIN 81 MG PO CHEW
81.0000 mg | CHEWABLE_TABLET | ORAL | Status: AC
  Administered 2013-07-07: 81 mg via ORAL
  Filled 2013-07-06: qty 1

## 2013-07-06 MED ORDER — TECHNETIUM TC 99M SESTAMIBI - CARDIOLITE
10.0000 | Freq: Once | INTRAVENOUS | Status: AC | PRN
  Administered 2013-07-06: 10 via INTRAVENOUS

## 2013-07-06 MED ORDER — HEPARIN (PORCINE) IN NACL 100-0.45 UNIT/ML-% IJ SOLN
1650.0000 [IU]/h | INTRAMUSCULAR | Status: DC
Start: 2013-07-06 — End: 2013-07-07
  Administered 2013-07-07: 1650 [IU]/h via INTRAVENOUS
  Filled 2013-07-06 (×3): qty 250

## 2013-07-06 MED ORDER — HYDROCHLOROTHIAZIDE 25 MG PO TABS
25.0000 mg | ORAL_TABLET | Freq: Every day | ORAL | Status: DC
  Administered 2013-07-07 – 2013-07-08 (×2): 25 mg via ORAL
  Filled 2013-07-06 (×2): qty 1

## 2013-07-06 MED ORDER — NITROGLYCERIN 0.4 MG SL SUBL
SUBLINGUAL_TABLET | SUBLINGUAL | Status: AC
  Administered 2013-07-06: 0.4 mg via SUBLINGUAL
  Filled 2013-07-06: qty 1

## 2013-07-06 MED ORDER — SODIUM CHLORIDE 0.9 % IV SOLN
1.0000 mL/kg/h | INTRAVENOUS | Status: DC
  Administered 2013-07-07: 1 mL/kg/h via INTRAVENOUS

## 2013-07-06 NOTE — Assessment & Plan Note (Signed)
Tolerating statin, encouraged heart healthy diet, avoid trans fats, minimize simple carbs and saturated fats. Increase exercise as tolerated 

## 2013-07-06 NOTE — Progress Notes (Addendum)
       Patient Name: Brittney Cunningham Date of Encounter: 07/06/2013    SUBJECTIVE:No chest pain or problems over night. In Nuc Med now for myocardial imaging.  TELEMETRY:  NSR Filed Vitals:   07/05/13 1642 07/05/13 2000 07/05/13 2335 07/06/13 0339  BP: 161/48 152/53 150/43 159/54  Pulse: 86 79 80 73  Temp: 98.8 F (37.1 C) 98.3 F (36.8 C) 98.8 F (37.1 C) 98.6 F (37 C)  TempSrc: Oral     Resp: 18 18 18 20   Height:      Weight:    201 lb 1.6 oz (91.218 kg)  SpO2: 97% 98% 98% 98%    Intake/Output Summary (Last 24 hours) at 07/06/13 0816 Last data filed at 07/05/13 1351  Gross per 24 hour  Intake    240 ml  Output      0 ml  Net    240 ml   LABS: Basic Metabolic Panel:  Recent Labs  07/05/13 0810 07/05/13 1600  NA 138  --   K 4.5  --   CL 103  --   CO2 18*  --   GLUCOSE 124*  --   BUN 31*  --   CREATININE 1.05 0.86  CALCIUM 9.4  --    CBC:  Recent Labs  07/05/13 0810 07/05/13 1600 07/06/13 0412  WBC 5.4 6.4 6.0  NEUTROABS 2.9  --   --   HGB 9.6* 10.7* 10.2*  HCT 30.9* 35.0* 33.6*  MCV 90.4 91.4 91.6  PLT 273 278 263   Cardiac Enzymes:  Recent Labs  07/05/13 0810 07/05/13 1600 07/05/13 2050  TROPONINI <0.30 <0.30 <0.30   Fasting Lipid Panel:  Recent Labs  07/03/13 1005  CHOL 130  HDL 22*  LDLCALC 80  TRIG 142  CHOLHDL 5.9    Radiology/Studies:  CXR IMPRESSION:  Mild patchy opacity in the right mid lung, possibly reflecting  pneumonia in the appropriate clinical setting. Follow-up chest  radiographs are suggested to document clearance. In the absence of  infectious symptoms, consider CT chest with contrast.  Suspected small right pleural effusion with associated right lower  lobe opacity, likely atelectasis.  Widespread sclerotic osseous metastases.    Physical Exam: Blood pressure 159/54, pulse 73, temperature 98.6 F (37 C), temperature source Oral, resp. rate 20, height 5' 2.5" (1.588 m), weight 201 lb 1.6 oz (91.218  kg), last menstrual period 03/13/2012, SpO2 98.00%. Weight change:   Wt Readings from Last 3 Encounters:  07/06/13 201 lb 1.6 oz (91.218 kg)  07/03/13 203 lb 0.6 oz (92.098 kg)  06/30/13 202 lb 12.8 oz (91.989 kg)   Seen and examined in nuclear medicine where she was having CP after lexiscan infusion. Chest is clear to A and P S 4 gallop on exam ASSESSMENT:  1. Chest pain suspicious for angina. MI ruled out by labs 2. Multivessel CAD with 3 vessel Calcium on CT and prior cath with occluded RCA 3. Widely metastatic breast cancer.  Plan:  1. Myocardial perfusion imaging. 2. Needs echo to assess pericardial space 3. Cath if moderate to high risk perfusion study otherwise, medical therapy 4. Tighten BP control  Signed, Belva Crome III 07/06/2013, 8:16 AM

## 2013-07-06 NOTE — Assessment & Plan Note (Signed)
hgba1c acceptable, minimize simple carbs. Increase exercise as tolerated. Continue current meds 

## 2013-07-06 NOTE — Assessment & Plan Note (Signed)
Filled out handicap form for patient today

## 2013-07-06 NOTE — Progress Notes (Addendum)
Nuc reviewed -  1. Positive for small foci of inducible ischemia in the anterolateral and inferior walls of the mid ventricle and apex. 2. Small fixed defect in the mid ventricular inferolateral wall consistent with a region of remote infarct/scarring. 3. Calculated ejection fraction 61%. 4. No significant cardiac wall motion abnormality.  She is felt to require cath. Agree with beta blocker increase - BP is coming down but needs better control. Continue IV heparin, aspirin, BB, statin. If she has recurrent CP we can consider adding standing NTG. Of note anemia appears stable. Risks and benefits of cardiac catheterization have been discussed with the patient. These include bleeding, infection, kidney damage, stroke, heart attack, death. The patient understands these risks and is willing to proceed. Laquinda Moller PA-C   Addendum: 2d echo is still pending but CTA did not appreciate any significant pericardial fluid. Also, TSH was suppressed. Will add free T4 - appreciate IM's assistance with possible levothyroxine adjustment. Arley Salamone PA-C

## 2013-07-06 NOTE — Progress Notes (Addendum)
[facility] PROGRESS NOTE  Brittney Cunningham TML:[id number] DOB: 12/17/1940 DOA: 07/05/2013 PCP: Penni Homans, MD  Assessment/Plan  Chest pain in setting of known CAD-cardiac catheterization in 2011 reviewed. -  Telemetry:  NSR -  Troponins negative -  Stress test completed this AM:  Had recurrence of chest pain during test, results pending -  Continue ASA, plavix, BB, statin -  Heparin started per cardiology  Malignant hypertension with very elevated BPs while in stress test -  Increase carvedilol -  Increase HCTZ -  Continue losartan -  Consider addition of norvasc/hydralazine/imdur/clonidine as needed -  Monitor until tomorrow   Anemia, likely secondary to chronic disease-breast cancer -  Gets darbepoetin infusion as an outpatient.  -  hgb stable   CAROTID ARTERY STENOSIS s/p right carotid endarterectomy, and stenting of the left carotid.  -  Continue antiplatelet agents and statin   Breast cancer metastasized to bone  - Outpatient follow up with oncology, reviewed recent oncology notes - disease he seems to be largely controlled.   DIABETES MELLITUS, TYPE II CBG well controlled - Continue Lantus and SSI.  -  A1c with AML  GERD, stable.  Continue PPI, as needed GI cocktail.   HYPERLIPIDEMIA, stable, continue statin.   HYPOTHYROIDISM, recent TSH was 0.045 so synthroid dose was decreased a few days ago by PCP. - Continue levothyroxine at current dose and f/u with PCP in 4 weeks for repeat TSH  Diet:  diabetic Access:  PIV IVF:  yes Proph:  heparin  Code Status: full Family Communication: patient and husband Disposition Plan: pending stable BP  Consultants:  Cardiology  Procedures:  NM stress test  Antibiotics:  none   HPI/Subjective:  Feeling much better after administration of nitroglycerin down in nuclear medicine.  Objective: Filed Vitals:   07/06/13 1024 07/06/13 1029 07/06/13 1045 07/06/13 1213  BP: 241/82 226/78 221/65 156/48  Pulse: 90  96 90 69  Temp:    97.8 F (36.6 C)  TempSrc:    Oral  Resp:    18  Height:      Weight:      SpO2:    99%    Intake/Output Summary (Last 24 hours) at 07/06/13 1219 Last data filed at 07/05/13 1351  Gross per 24 hour  Intake    240 ml  Output      0 ml  Net    240 ml   Filed Weights   07/05/13 0743 07/05/13 1315 07/06/13 0339  Weight: 91.627 kg (202 lb) 89.9 kg (198 lb 3.1 oz) 91.218 kg (201 lb 1.6 oz)    Exam:   General:  Caucasian female, No acute distress  HEENT:  NCAT, MMM  Cardiovascular:  RRR, nl S1, S2 no mrg, 2+ pulses, warm extremities  Respiratory:  CTAB, no increased WOB  Abdomen:   NABS, soft, NT/ND  MSK:   Normal tone and bulk, no LEE  Neuro:  Grossly intact  Data Reviewed: Basic Metabolic Panel:  Recent Labs Lab 06/30/13 1005 07/05/13 0810 07/05/13 1600  NA 138 138  --   K 4.9 4.5  --   CL  --  103  --   CO2 18* 18*  --   GLUCOSE 70 124*  --   BUN 20.9 31*  --   CREATININE 0.9 1.05 0.86  CALCIUM 9.5 9.4  --    Liver Function Tests:  Recent Labs Lab 06/30/13 1005 07/05/13 0810  AST 18 14  ALT 9 8  ALKPHOS 118  125*  BILITOT 0.45 0.3  PROT 7.1 6.8  ALBUMIN 3.2* 3.1*   No results found for this basename: LIPASE, AMYLASE,  in the last 168 hours No results found for this basename: AMMONIA,  in the last 168 hours CBC:  Recent Labs Lab 06/30/13 1005 07/05/13 0810 07/05/13 1600 07/06/13 0412  WBC 6.5 5.4 6.4 6.0  NEUTROABS 3.6 2.9  --   --   HGB 10.1* 9.6* 10.7* 10.2*  HCT 31.5* 30.9* 35.0* 33.6*  MCV 88.3 90.4 91.4 91.6  PLT 293 273 278 263   Cardiac Enzymes:  Recent Labs Lab 07/05/13 0810 07/05/13 1600 07/05/13 2050  TROPONINI <0.30 <0.30 <0.30   BNP (last 3 results) No results found for this basename: PROBNP,  in the last 8760 hours CBG:  Recent Labs Lab 07/05/13 1326 07/05/13 1636 07/05/13 2053 07/06/13 0736 07/06/13 1207  GLUCAP 88 130* 185* 84 135*    No results found for this or any previous visit  (from the past 240 hour(s)).   Studies: Dg Chest 2 View  07/05/2013   CLINICAL DATA:  Chest pain  EXAM: CHEST  2 VIEW  COMPARISON:  PET-CT dated 10/15/2012  FINDINGS: Chronic interstitial markings. Superimposed mild patchy opacity in the right mid lung, possibly reflecting pneumonia. Suspected small right pleural effusion with associated right lower lobe opacity, likely atelectasis.  The heart is normal in size.  Right chest port.  Widespread sclerotic osseous metastases.  IMPRESSION: Mild patchy opacity in the right mid lung, possibly reflecting pneumonia in the appropriate clinical setting. Follow-up chest radiographs are suggested to document clearance. In the absence of infectious symptoms, consider CT chest with contrast.  Suspected small right pleural effusion with associated right lower lobe opacity, likely atelectasis.  Widespread sclerotic osseous metastases.   Electronically Signed   By: Julian Hy M.D.   On: 07/05/2013 10:35   Ct Angio Chest Pe W/cm &/or Wo Cm  07/05/2013   CLINICAL DATA:  Sub sternal chest pain. Elevated D-dimer. History breast cancer with metastatic disease to the bones.  EXAM: CT ANGIOGRAPHY CHEST WITH CONTRAST  TECHNIQUE: Multidetector CT imaging of the chest was performed using the standard protocol during bolus administration of intravenous contrast. Multiplanar CT image reconstructions and MIPs were obtained to evaluate the vascular anatomy.  CONTRAST:  36mL OMNIPAQUE IOHEXOL 350 MG/ML SOLN  COMPARISON:  PET-CT 10/15/2012.  FINDINGS: Mediastinum: There are no filling defects within the pulmonary arterial tree to suggest underlying pulmonary embolism. Heart size is mildly enlarged. There is no significant pericardial fluid, thickening or pericardial calcification. There is atherosclerosis of the thoracic aorta, the great vessels of the mediastinum and the coronary arteries, including calcified atherosclerotic plaque in the left main, left anterior descending, left  circumflex and right coronary arteries. No pathologically enlarged mediastinal, hilar or internal mammary lymph nodes. Esophagus is unremarkable in appearance. Right single-lumen subclavian Port-A-Cath with tip terminating in the right atrium.  Lungs/Pleura: 9 mm pleural-based nodule in the periphery of the right lower lobe (image 62 of series 4), unchanged compared to 10/15/2012. 4 mm left upper lobe nodule (image 30 of series 6) is also unchanged. No other larger more suspicious appearing pulmonary nodules or masses are noted. There is a background of mild to moderate centrilobular and paraseptal emphysema, most apparent throughout the lung apices. Mild diffuse bronchial wall thickening. Patchy areas of mild pleural thickening are again noted in the right hemithorax, without other areas of definite pleural nodularity. Mild subpleural reticulation throughout the anterior aspect of the  right upper and middle lobes, presumably related to prior radiation therapy.  Upper Abdomen: Extensive atherosclerosis.  Otherwise, unremarkable.  Musculoskeletal: Status post right modified radical mastectomy and right axillary nodal dissection. Widespread predominantly sclerotic lesions throughout all portions of the visualized axial and appendicular skeleton, compatible with widespread metastatic disease.  Review of the MIP images confirms the above findings.  IMPRESSION: 1. No evidence of pulmonary embolism. 2. Widespread metastatic disease to the bones redemonstrated. Status post modified radical right-sided mastectomy and right axillary nodal dissection, without findings to suggest local recurrence of disease. 3. Small pulmonary nodules in the lungs bilaterally are unchanged compared to prior examinations. Continued attention on followup studies is recommended. 4. Mild to moderate cardiomegaly. 5. Atherosclerosis, including left main and 3 vessel coronary artery disease. Please Assessment for potential risk factor modification,  dietary therapy or pharmacologic therapy may be warranted, if clinically indicated. 6. Additional incidental findings, as above.   Electronically Signed   By: Vinnie Langton M.D.   On: 07/05/2013 13:06    Scheduled Meds: . aspirin  81 mg Oral Daily  . atorvastatin  40 mg Oral Daily  . carvedilol  12.5 mg Oral BID WC  . clopidogrel  75 mg Oral Daily  . hydrochlorothiazide  12.5 mg Oral Daily  . insulin glargine  60 Units Subcutaneous QHS  . levothyroxine  200 mcg Oral QAC breakfast  . [START ON 07/07/2013] levothyroxine  75 mcg Oral Once per day on Mon Fri  . losartan  50 mg Oral Daily  . morphine  30 mg Oral BID  . nitroGLYCERIN       Continuous Infusions: . sodium chloride 1,000 mL (07/06/13 0745)  . heparin 1,300 Units/hr (07/06/13 0741)    Principal Problem:   Chest pain Active Problems:   HYPOTHYROIDISM   DIABETES MELLITUS, TYPE II   HYPERLIPIDEMIA   ANEMIA   HYPERTENSION   GERD   CAROTID ARTERY STENOSIS   Breast cancer metastasized to bone   CAD (coronary artery disease), native coronary artery    Time spent: 30 min    [facility] Pager [phone]. If 7PM-7AM, please contact night-coverage at [email or web address], password [facility] 07/06/2013, 12:19 PM  LOS: 1 day

## 2013-07-06 NOTE — Progress Notes (Addendum)
ANTICOAGULATION CONSULT NOTE - Initial Consult  Pharmacy Consult for heparin Indication: chest pain/ACS  Allergies  Allergen Reactions  . Chlorhexidine Hives, Itching and Rash    This was most likely a CONTACT DERMATITIS versus true systemic allergic reaction  . Adhesive [Tape] Hives  . Codeine Swelling    Patient Measurements: Height: 5' 2.5" (158.8 cm) Weight: 201 lb 1.6 oz (91.218 kg) IBW/kg (Calculated) : 51.25 Heparin Dosing Weight: 75kg  Vital Signs: Temp: 97.8 F (36.6 C) (06/07 0800) Temp src: Oral (06/07 0800) BP: 206/51 mmHg (06/07 0900) Pulse Rate: 69 (06/07 0900)  Labs:  Recent Labs  07/05/13 0810 07/05/13 1600 07/05/13 2050 07/06/13 0412  HGB 9.6* 10.7*  --  10.2*  HCT 30.9* 35.0*  --  33.6*  PLT 273 278  --  263  HEPARINUNFRC  --   --  <0.10* 0.36  CREATININE 1.05 0.86  --   --   TROPONINI <0.30 <0.30 <0.30  --     Estimated Creatinine Clearance: 64.7 ml/min (by C-G formula based on Cr of 0.86).   Medical History: Past Medical History  Diagnosis Date  . Anemia   . Depression   . Diabetes mellitus type II   . GERD (gastroesophageal reflux disease)   . Hyperlipidemia   . Hypothyroidism   . Osteoarthritis   . Carotid artery stenosis     bilateral  . CAD (coronary artery disease)   . OSA (obstructive sleep apnea)   . Breast cancer     right breast cancer-invasive  ductal carcinoma (StageIV)  . Hypertension   . Anxiety   . Carotid artery occlusion   . Unspecified constipation 03/15/2012  . Bursitis   . Collagen vascular disease   . History of radiation therapy 10/31/12-11/13/12    rt&lt humerous 30Gy/32fx     Assessment: 72 YOF brought in with chest discomfort and dyspnea. First troponin negative. Getting myocardial imaging today. Level was therapeutic this AM.   Goal of Therapy:  Heparin level 0.3-0.7 units/ml Monitor platelets by anticoagulation protocol: Yes   Plan:   Cont heparin at 1300 units/hr F/u with confirm level

## 2013-07-06 NOTE — Progress Notes (Signed)
[facility]  2D Echocardiogram completed.  [place], RDCS 07/06/2013 3:29 PM

## 2013-07-06 NOTE — Progress Notes (Signed)
Patient ID: Brittney Cunningham, female   DOB: Nov 13, 1940, 73 y.o.   MRN: [id number] TESHA ARCHAMBEAU [id number] Mar 28, 1940 07/06/2013      Progress Note-Follow Up  Subjective  Chief Complaint  Chief Complaint  Patient presents with  . Medication Refill  . handicap form    HPI  Patient is a 73 year old female in today for routine medical care. Is requesting a handicap sticker application do to her unsteady gait and weakness. Following closely with oncology and has an appointment next week. Continues to get regular B12 injections. No other recent illness. Denies CP/palp/SOB/HA/congestion/fevers/GI or GU c/o. Taking meds as prescribed  Past Medical History  Diagnosis Date  . Anemia   . Depression   . Diabetes mellitus type II   . GERD (gastroesophageal reflux disease)   . Hyperlipidemia   . Hypothyroidism   . Osteoarthritis   . Carotid artery stenosis     bilateral  . CAD (coronary artery disease)   . OSA (obstructive sleep apnea)   . Breast cancer     right breast cancer-invasive  ductal carcinoma (StageIV)  . Hypertension   . Anxiety   . Carotid artery occlusion   . Unspecified constipation 03/15/2012  . Bursitis   . Collagen vascular disease   . History of radiation therapy 10/31/12-11/13/12    rt&lt humerous 30Gy/30f    Past Surgical History  Procedure Laterality Date  . Incisional breast biopsy      remoted left breast biopsy  . Cesarean section    . Other surgical history      GYN surgery  . Knee surgery      right knee surgery  . Carotid endarterectomy      right carotid  . Port-a-cath removal    . Modified mastectomy      Right breast  . Breast surgery    . Mastectomy    . Carotid stent      left    Family History  Problem Relation Age of Onset  . Arthritis    . Coronary artery disease      first degree relative  . Stroke      first degree relative  . Diabetes Mother   . Heart disease Mother   . Hypertension Mother   . Cancer Mother   . Deep vein  thrombosis Mother   . Colon cancer Neg Hx   . Stomach cancer Neg Hx   . Cancer Father     Pancreatic cancer  . Breast cancer Paternal AAunt 43   History   Social History  . Marital Status: Married    Spouse Name: N/A    Number of Children: 3  . Years of Education: N/A   Occupational History  . 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 Smoking status: Former Smoker -- 1.00 packs/day for 20 years    Types: Cigarettes    Quit date: 01/31/1988  . Smokeless tobacco: Never Used  . Alcohol Use: No  . Drug Use: No  . Sexual Activity: Not Currently    Birth Control/ Protection: Post-menopausal   Other Topics Concern  . Not on file   Social History Narrative   Retired-ran a daycare facility for 40 years.   Married- 460years    2 sons    1 daughter - nMarine scientistmidwife   Former smoker-quit in [place]     No current facility-administered medications on file prior to  visit.   Current Outpatient Prescriptions on File Prior to Visit  Medication Sig Dispense Refill  . aspirin 81 MG chewable tablet Chew 81 mg by mouth daily.      Marland Kitchen atorvastatin (LIPITOR) 40 MG tablet Take 1 tablet (40 mg total) by mouth daily.  30 tablet  0  . Blood Glucose Monitoring Suppl (ACCU-CHEK NANO SMARTVIEW) W/DEVICE KIT Use to check blood sugar twice a day. DX 250.00  1 kit  0  . carvedilol (COREG) 12.5 MG tablet Take 1 tablet (12.5 mg total) by mouth 2 (two) times daily with a meal.  30 tablet  0  . chlorhexidine (PERIDEX) 0.12 % solution Rinse for 90  seconds  with 15 mLs three times daily. Spit out excess. Do NOT swallow.  480 mL  prn  . clopidogrel (PLAVIX) 75 MG tablet Take 1 tablet (75 mg total) by mouth daily.  30 tablet  8  . cyanocobalamin (,VITAMIN B-12,) 1000 MCG/ML injection Inject 1,000 mcg into the muscle every 30 (thirty) days. Last dose 03/09/2011      . darbepoetin (ARANESP) 200 MCG/0.4ML SOLN Inject 200 mcg into the skin once. Every 30 days      . fulvestrant (FASLODEX)  250 MG/5ML injection Inject 500 mg into the muscle every 30 (thirty) days. One injection each buttock over 1-2 minutes. Warm prior to use.      Marland Kitchen glucose blood (ACCU-CHEK AVIVA) test strip Use as instructed to check blood sugar twice a day  Dx 250.00  100 each  3  . glycerin adult (GLYCERIN ADULT) 2 G SUPP Place 1 suppository rectally once as needed (constipation).  10 suppository  0  . hydrochlorothiazide (MICROZIDE) 12.5 MG capsule Take 1 capsule (12.5 mg total) by mouth daily.  30 capsule  0  . ibuprofen (ADVIL) 200 MG tablet Take 200 mg by mouth 3 (three) times daily.        . insulin glargine (LANTUS) 100 UNIT/ML injection Inject 0.6 mLs (60 Units total) into the skin at bedtime. Pt wants vials.  30 mL  1  . levothyroxine (SYNTHROID, LEVOTHROID) 200 MCG tablet Take 1 tablet (200 mcg total) by mouth daily before breakfast.  30 tablet  3  . levothyroxine (SYNTHROID, LEVOTHROID) 75 MCG tablet 54mg MOn  & Fridays with the 2042m daily      . losartan (COZAAR) 50 MG tablet Take 1 tablet (50 mg total) by mouth daily.  30 tablet  5  . metFORMIN (GLUCOPHAGE) 500 MG tablet Take 1 tablet (500 mg total) by mouth every morning.  30 tablet  5  . morphine (MS CONTIN) 30 MG 12 hr tablet Take 1 tablet (30 mg total) by mouth 2 (two) times daily.  60 tablet  0  . polyethylene glycol (MIRALAX / GLYCOLAX) packet Take 17 g by mouth daily as needed.      . [DISCONTINUED] simvastatin (ZOCOR) 40 MG tablet Take 40 mg by mouth every evening.        Allergies  Allergen Reactions  . Chlorhexidine Hives, Itching and Rash    This was most likely a CONTACT DERMATITIS versus true systemic allergic reaction  . Adhesive [Tape] Hives  . Codeine Swelling    Review of Systems  Review of Systems  Constitutional: Negative for fever and malaise/fatigue.  HENT: Negative for congestion.   Eyes: Negative for discharge.  Respiratory: Negative for shortness of breath.   Cardiovascular: Negative for chest pain, palpitations  and leg swelling.  Gastrointestinal: Negative for nausea, abdominal  pain and diarrhea.  Genitourinary: Negative for dysuria.  Musculoskeletal: Negative for falls.  Skin: Negative for rash.  Neurological: Negative for loss of consciousness and headaches.  Endo/Heme/Allergies: Negative for polydipsia.  Psychiatric/Behavioral: Negative for depression and suicidal ideas. The patient is not nervous/anxious and does not have insomnia.     Objective  BP 140/64  Pulse 83  Temp(Src) 98.4 F (36.9 C) (Oral)  Ht _0  (1.6 m)  Wt 203 lb 0.6 oz (92.098 kg)  BMI 35.98 kg/m2  SpO2 93%  LMP 03/13/2012  Physical Exam  Physical Exam  Constitutional: She is oriented to person, place, and time and well-developed, well-nourished, and in no distress. No distress.  HENT:  Head: Normocephalic and atraumatic.  Eyes: Conjunctivae are normal.  Neck: Neck supple. No thyromegaly present.  Cardiovascular: Normal rate, regular rhythm and normal heart sounds.   No murmur heard. Pulmonary/Chest: Effort normal and breath sounds normal. She has no wheezes.  Abdominal: She exhibits no distension and no mass.  Musculoskeletal: She exhibits no edema.  Lymphadenopathy:    She has no cervical adenopathy.  Neurological: She is alert and oriented to person, place, and time.  Skin: Skin is warm and dry. No rash noted. She is not diaphoretic.  Psychiatric: Memory, affect and judgment normal.    Lab Results  Component Value Date   TSH 0.045* 07/03/2013   Lab Results  Component Value Date   WBC 6.0 07/06/2013   HGB 10.2* 07/06/2013   HCT 33.6* 07/06/2013   MCV 91.6 07/06/2013   PLT 263 07/06/2013   Lab Results  Component Value Date   CREATININE 0.86 07/05/2013   BUN 31* 07/05/2013   NA 138 07/05/2013   K 4.5 07/05/2013   CL 103 07/05/2013   CO2 18* 07/05/2013   Lab Results  Component Value Date   ALT 8 07/05/2013   AST 14 07/05/2013   ALKPHOS 125* 07/05/2013   BILITOT 0.3 07/05/2013   Lab Results  Component Value Date    CHOL 130 07/03/2013   Lab Results  Component Value Date   HDL 22* 07/03/2013   Lab Results  Component Value Date   LDLCALC 80 07/03/2013   Lab Results  Component Value Date   TRIG 142 07/03/2013   Lab Results  Component Value Date   CHOLHDL 5.9 07/03/2013     Assessment & Plan  HYPOTHYROIDISM On Levothyroxine, continue to monitor  HYPERTENSION Well controlled, no changes to meds. Encouraged heart healthy diet such as the DASH diet and exercise as tolerated.   DIABETES MELLITUS, TYPE II hgba1c acceptable, minimize simple carbs. Increase exercise as tolerated. Continue current meds  HYPERLIPIDEMIA Tolerating statin, encouraged heart healthy diet, avoid trans fats, minimize simple carbs and saturated fats. 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

## 2013-07-06 NOTE — Discharge Summary (Signed)
Physician Discharge Summary  Brittney Cunningham BDZ:[id number] DOB: 04-03-1940 DOA: 07/05/2013  PCP: Penni Homans, MD  Admit date: 07/05/2013 Discharge date: 07/08/2013  Recommendations for Outpatient Follow-up:  1. Followup with cardiology in 2-4 weeks 2. Followup with primary care within 2 weeks for ongoing blood pressure management and repeat TSH 3. Oncology at next scheduled ppointment.  Discharge Diagnoses:  Principal Problem:   CAD (coronary artery disease), native coronary artery Active Problems:   HYPOTHYROIDISM   DIABETES MELLITUS, TYPE II   HYPERLIPIDEMIA   ANEMIA   HYPERTENSION   GERD   CAROTID ARTERY STENOSIS   Breast cancer metastasized to bone   Chest pain   Unstable angina   Discharge Condition: Stable, improved  Diet recommendation: Healthy heart/diabetic  Wt Readings from Last 3 Encounters:  07/08/13 92.488 kg (203 lb 14.4 oz)  07/08/13 92.488 kg (203 lb 14.4 oz)  07/03/13 92.098 kg (203 lb 0.6 oz)    History of present illness:  Brittney Cunningham is a 73 y.o. female with a Past Medical History of known coronary artery disease, carotid artery stenosis status post right carotid endarterectomy and stenting in the left carotid, diabetes, hypertension, dyslipidemia, stage IV breast cancer who presents today with the above noted complaint. patient claims that she woke up sometime early this morning with chest pain that she describes as heaviness, she does not know exactly what time this occurred, however this was associated with nausea, shortness of breath. She claimed the pain slowly eased off after a few minutes and she went back to sleep only to be awoken around 5:54 this a.m. with another episode of chest pain. She describes this episode as heaviness, and was again associated with nausea and shortness of breath. She thinks that the pain radiated to her back. Since the pain was persistent, she was brought to the emergency room, and I was asked to admit this patient for  further evaluation and treatment  Currently she is chest pain-free. She no longer has any shortness of breath.  She denies any history of cough or fever. There is no history of headache, abdominal pain, vomiting, or diarrhea.   Hospital Course:   Chest pain due to coronary artery disease/unstable angina.  Patient presented with episodic chest pain concerning for ischemia. Telemetry demonstrated normal sinus rhythm and her troponins were negative. She underwent a nuclear medicine stress test on 6/7 which demonstrated a small foci of inducible ischemia in the anterolateral and inferior walls of the mid ventricle and apex. She had a fixed defect in the mid ventricle inferolateral wall consistent with previous infarct. She had preserved ejection fraction. During her stress test, she developed severe hypertension and recurrence of her chest pain like symptoms. These resolved with nitroglycerin.  ECHO demonstrated an ejection fraction of 55-60% with no wall motion abnormalities, grade 1 diastolic dysfunction, aortic valve sclerosis, moderate mitral valve regurgitation, increased atrial septum thickness with likely lipomatous hypertrophy, mild tricuspid valve regurgitation.   She underwent followup cardiac catheterization on 6/8 which demonstrated total occlusion of the RCA which appears to be chronic, patency of the left main and left anterior descending arteries, and moderately severe ostial left circumflex stenosis which is likely a hemodynamically significant lesion and contributing to the patient's symptoms. The patient's lesion, however was unfavorable to PCI and would involve complex stenting, so no interventions were made during catheterization.  Cardiology recommended medical therapy for now. She should continue aspirin, Plavix, statin. Her beta blocker was increased and Imdur was added.  She  was able to ambulate without shortness of breath, chest pressure or her recurrent symptoms prior to  discharge.  Malignant hypertension with transiently elevated blood pressures to 240s/80s while in stress test. Her blood pressure trended down with resumption of her home medications and 2 doses of nitroglycerin. Her blood pressure remained markedly elevated. She had increases of her HCTZ and her curve it a lot. She continued losartan, Norvasc, hydralazine, and dura, and clonidine. She will need additional changes to her blood pressure medications to get her blood pressure to less than 140/80.  Anemia, likely secondary to the chronic disease. She gets intermittent injections of darbepoetin by oncology.  Carotid artery stenosis status post right carotid endarterectomy and stenting of the left carotid artery. She continued her antiplatelet agents and statin.  Breast cancer with metastases to bone. She is followed by oncology as an outpatient.  Diabetes mellitus, her CBGs were well controlled. Her hemoglobin A1c was 6.5.  She may resume her home insulin regimen.  GERD, stable, continue PPI.  Hypothyroidism, on Synthroid. Gender recent TSH of 0.045 which was addressed by her primary care doctor only a day prior to admission. Her Synthroid dose was decreased at that time. Recommend that she have her primary care doctor followup her TSH in 2-4 weeks and make future adjustments. She recently had an adjustment in her Synthroid, do not recommend making further adjustments until she follows up with her primary care doctor.  Consultants:  Cardiology Procedures:  NM stress test ECHO  Antibiotics:  none   Discharge Exam: Filed Vitals:   07/08/13 0617  BP: 131/39  Pulse: 66  Temp: 98.4 F (36.9 C)  Resp: 18   Filed Vitals:   07/07/13 1930 07/07/13 1945 07/07/13 2030 07/08/13 0617  BP: 131/82 101/69 115/93 131/39  Pulse: 80 80 88 66  Temp:   99.1 F (37.3 C) 98.4 F (36.9 C)  TempSrc:   Oral Oral  Resp:   18 18  Height:      Weight:    92.488 kg (203 lb 14.4 oz)  SpO2: 95% 95% 97% 96%     General: Caucasian female, No acute distress  HEENT: NCAT, MMM  Cardiovascular: RRR, nl S1, S2 no mrg, 2+ pulses, warm extremities  Respiratory: CTAB, no increased WOB  Abdomen: NABS, soft, NT/ND  MSK: Normal tone and bulk, no LEE  Neuro: Grossly intact   Discharge Instructions      Discharge Instructions   Call MD for:  difficulty breathing, headache or visual disturbances    Complete by:  As directed      Call MD for:  extreme fatigue    Complete by:  As directed      Call MD for:  hives    Complete by:  As directed      Call MD for:  persistant dizziness or light-headedness    Complete by:  As directed      Call MD for:  persistant nausea and vomiting    Complete by:  As directed      Call MD for:  redness, tenderness, or signs of infection (pain, swelling, redness, odor or green/yellow discharge around incision site)    Complete by:  As directed      Call MD for:  severe uncontrolled pain    Complete by:  As directed      Call MD for:  temperature >100.4    Complete by:  As directed      Diet - low sodium heart healthy  Complete by:  As directed      Diet Carb Modified    Complete by:  As directed      Discharge instructions    Complete by:  As directed   You were hospitalized with angina or heart-related chest pain.  Your heart catheterization showed that you have coronary artery disease.  Because of the location of your blockages, you were unable to have a stent placed, so we have tried to optimize your medications to reduce your risk of chest pain.  Please increase your lipitor to 4m daily, carvedilol to 228mtwice a day.  We have added imdur and this dose may need to be increased if you continue to have episodes of pain.  Please use nitroglycerin if you have mild chest pains when exerting yourself, however, if your pain increases in severity, frequency, or if you develop other concerning symptoms with your pain, call 911.     Increase activity slowly    Complete  by:  As directed             Medication List    STOP taking these medications       ADVIL 200 MG tablet  Generic drug:  ibuprofen      TAKE these medications       ACCU-CHEK NANO SMARTVIEW W/DEVICE Kit  Use to check blood sugar twice a day. DX 250.00     aspirin 81 MG chewable tablet  Chew 81 mg by mouth daily.     atorvastatin 80 MG tablet  Commonly known as:  LIPITOR  Take 1 tablet (80 mg total) by mouth daily.     carvedilol 25 MG tablet  Commonly known as:  COREG  Take 1 tablet (25 mg total) by mouth 2 (two) times daily with a meal.     chlorhexidine 0.12 % solution  Commonly known as:  PERIDEX  Rinse for 90  seconds  with 15 mLs three times daily. Spit out excess. Do NOT swallow.     clopidogrel 75 MG tablet  Commonly known as:  PLAVIX  Take 1 tablet (75 mg total) by mouth daily.     cyanocobalamin 1000 MCG/ML injection  Commonly known as:  (VITAMIN B-12)  Inject 1,000 mcg into the muscle every 30 (thirty) days. Last dose 03/09/2011     darbepoetin 200 MCG/0.4ML Soln injection  Commonly known as:  ARANESP  Inject 200 mcg into the skin once. Every 30 days     fulvestrant 250 MG/5ML injection  Commonly known as:  FASLODEX  Inject 500 mg into the muscle every 30 (thirty) days. One injection each buttock over 1-2 minutes. Warm prior to use.     glucose blood test strip  Commonly known as:  ACCU-CHEK AVIVA  Use as instructed to check blood sugar twice a day  Dx 250.00     glycerin adult 2 G Supp  Commonly known as:  glycerin adult  Place 1 suppository rectally once as needed (constipation).     hydrochlorothiazide 12.5 MG capsule  Commonly known as:  MICROZIDE  Take 1 capsule (12.5 mg total) by mouth daily.     insulin glargine 100 UNIT/ML injection  Commonly known as:  LANTUS  Inject 0.6 mLs (60 Units total) into the skin at bedtime. Pt wants vials.     isosorbide mononitrate 60 MG 24 hr tablet  Commonly known as:  IMDUR  Take 1 tablet (60 mg total)  by mouth daily.     levothyroxine 200 MCG  tablet  Commonly known as:  SYNTHROID, LEVOTHROID  Take 1 tablet (200 mcg total) by mouth daily before breakfast.     levothyroxine 75 MCG tablet  Commonly known as:  SYNTHROID, LEVOTHROID  10mg MOn  & Fridays with the 2056m daily     losartan 50 MG tablet  Commonly known as:  COZAAR  Take 1 tablet (50 mg total) by mouth daily.     metFORMIN 500 MG tablet  Commonly known as:  GLUCOPHAGE  Take 1 tablet (500 mg total) by mouth every morning.     morphine 30 MG 12 hr tablet  Commonly known as:  MS CONTIN  Take 1 tablet (30 mg total) by mouth 2 (two) times daily.     nitroGLYCERIN 0.4 MG SL tablet  Commonly known as:  NITROSTAT  Place 1 tablet (0.4 mg total) under the tongue every 5 (five) minutes as needed for chest pain.     polyethylene glycol packet  Commonly known as:  MIRALAX / GLYCOLAX  Take 17 g by mouth daily as needed.       Follow-up Information   Follow up with BLPenni HomansMD. Schedule an appointment as soon as possible for a visit in 2 weeks.   Specialty:  Family Medicine   Contact information:   [phone]       Follow up with Peter JoMartiniqueMD. Schedule an appointment as soon as possible for a visit in 1 month.   Specialty:  Cardiology   Contact information:   [id number] [place] [phone]        The results of significant diagnostics from this hospitalization (including imaging, microbiology, ancillary and laboratory) are listed below for reference.    Significant Diagnostic Studies: Dg Chest 2 View  07/05/2013   CLINICAL DATA:  Chest pain  EXAM: CHEST  2 VIEW  COMPARISON:  PET-CT dated 10/15/2012  FINDINGS: Chronic interstitial markings. Superimposed mild patchy opacity in the right mid lung, possibly reflecting pneumonia. Suspected small right pleural effusion with associated right lower lobe opacity, likely atelectasis.  The  heart is normal in size.  Right chest port.  Widespread sclerotic osseous metastases.  IMPRESSION: Mild patchy opacity in the right mid lung, possibly reflecting pneumonia in the appropriate clinical setting. Follow-up chest radiographs are suggested to document clearance. In the absence of infectious symptoms, consider CT chest with contrast.  Suspected small right pleural effusion with associated right lower lobe opacity, likely atelectasis.  Widespread sclerotic osseous metastases.   Electronically Signed   By: SrJulian Hy.D.   On: 07/05/2013 10:35   Ct Angio Chest Pe W/cm &/or Wo Cm  07/05/2013   CLINICAL DATA:  Sub sternal chest pain. Elevated D-dimer. History breast cancer with metastatic disease to the bones.  EXAM: CT ANGIOGRAPHY CHEST WITH CONTRAST  TECHNIQUE: Multidetector CT imaging of the chest was performed using the standard protocol during bolus administration of intravenous contrast. Multiplanar CT image reconstructions and MIPs were obtained to evaluate the vascular anatomy.  CONTRAST:  8089mMNIPAQUE IOHEXOL 350 MG/ML SOLN  COMPARISON:  PET-CT 10/15/2012.  FINDINGS: Mediastinum: There are no filling defects within the pulmonary arterial tree to suggest underlying pulmonary embolism. Heart size is mildly enlarged. There is no significant pericardial fluid, thickening or pericardial calcification. There is atherosclerosis of the thoracic aorta, the great vessels of the mediastinum and the coronary arteries, including calcified atherosclerotic plaque in the left main, left anterior  descending, left circumflex and right coronary arteries. No pathologically enlarged mediastinal, hilar or internal mammary lymph nodes. Esophagus is unremarkable in appearance. Right single-lumen subclavian Port-A-Cath with tip terminating in the right atrium.  Lungs/Pleura: 9 mm pleural-based nodule in the periphery of the right lower lobe (image 62 of series 4), unchanged compared to 10/15/2012. 4 mm left upper  lobe nodule (image 30 of series 6) is also unchanged. No other larger more suspicious appearing pulmonary nodules or masses are noted. There is a background of mild to moderate centrilobular and paraseptal emphysema, most apparent throughout the lung apices. Mild diffuse bronchial wall thickening. Patchy areas of mild pleural thickening are again noted in the right hemithorax, without other areas of definite pleural nodularity. Mild subpleural reticulation throughout the anterior aspect of the right upper and middle lobes, presumably related to prior radiation therapy.  Upper Abdomen: Extensive atherosclerosis.  Otherwise, unremarkable.  Musculoskeletal: Status post right modified radical mastectomy and right axillary nodal dissection. Widespread predominantly sclerotic lesions throughout all portions of the visualized axial and appendicular skeleton, compatible with widespread metastatic disease.  Review of the MIP images confirms the above findings.  IMPRESSION: 1. No evidence of pulmonary embolism. 2. Widespread metastatic disease to the bones redemonstrated. Status post modified radical right-sided mastectomy and right axillary nodal dissection, without findings to suggest local recurrence of disease. 3. Small pulmonary nodules in the lungs bilaterally are unchanged compared to prior examinations. Continued attention on followup studies is recommended. 4. Mild to moderate cardiomegaly. 5. Atherosclerosis, including left main and 3 vessel coronary artery disease. Please Assessment for potential risk factor modification, dietary therapy or pharmacologic therapy may be warranted, if clinically indicated. 6. Additional incidental findings, as above.   Electronically Signed   By: Vinnie Langton M.D.   On: 07/05/2013 13:06   Nm Myocar Multi W/spect W/wall Motion / Ef  07/06/2013   CLINICAL DATA:  73 year old female with chest pain. Additional risk factors include diabetes and known coronary artery disease.  EXAM:  MYOCARDIAL IMAGING WITH SPECT (REST AND PHARMACOLOGIC-STRESS)  GATED LEFT VENTRICULAR WALL MOTION STUDY  LEFT VENTRICULAR EJECTION FRACTION  TECHNIQUE: Standard myocardial SPECT imaging was performed after resting intravenous injection of 10 mCi Tc-53msestamibi. Subsequently, intravenous infusion of Lexiscan was performed under the supervision of the Cardiology staff. At peak effect of the drug, 30 mCi Tc-915mestamibi was injected intravenously and standard myocardial SPECT imaging was performed. Quantitative gated imaging was also performed to evaluate left ventricular wall motion, and estimate left ventricular ejection fraction.  COMPARISON:  CT PE study and chest x-ray 07/05/2013  FINDINGS: Evaluation of the cardiac gated data demonstrates an end-diastolic volume of 11914L and an end systolic volume of 43 mL yielding a calculated ejection fraction of 61%. No evidence of focal or global cardiac wall motion abnormality.  Evaluation of the static resting and post pharmacological stress images demonstrates relatively decreased radiotracer uptake in the mid ventricular and apical anterolateral and inferior walls on the post pharmacological stress images concerning for 2 small areas of inducible ischemia. A fixed defect of a decreased radiotracer uptake in the inferolateral wall is also present. No transient ischemic dilatation or other acute abnormality.  IMPRESSION: 1. Positive for small foci of inducible ischemia in the anterolateral and inferior walls of the mid ventricle and apex. 2. Small fixed defect in the mid ventricular inferolateral wall consistent with a region of remote infarct/scarring. 3. Calculated ejection fraction 61%. 4. No significant cardiac wall motion abnormality.   Electronically Signed  By: Jacqulynn Cadet M.D.   On: 07/06/2013 13:41   Mm Digital Screening Unilat L  06/19/2013   CLINICAL DATA:  Screening.  EXAM: DIGITAL SCREENING UNILATERAL LEFT MAMMOGRAM WITH CAD  COMPARISON:  Previous  exam(s).  ACR Breast Density Category b: There are scattered areas of fibroglandular density.  FINDINGS: There are no findings suspicious for malignancy. Images were processed with CAD.  IMPRESSION: No mammographic evidence of malignancy. A result letter of this screening mammogram will be mailed directly to the patient.  RECOMMENDATION: Screening mammogram in one year. (Code:SM-B-01Y)  BI-RADS CATEGORY  1: Negative.   Electronically Signed   By: Abelardo Diesel M.D.   On: 06/19/2013 14:42    Microbiology: No results found for this or any previous visit (from the past 240 hour(s)).   Labs: Basic Metabolic Panel:  Recent Labs Lab 07/05/13 0810 07/05/13 1600 07/08/13 0519  NA 138  --  137  K 4.5  --  4.2  CL 103  --  102  CO2 18*  --  19  GLUCOSE 124*  --  66*  BUN 31*  --  18  CREATININE 1.05 0.86 0.88  CALCIUM 9.4  --  9.1   Liver Function Tests:  Recent Labs Lab 07/05/13 0810  AST 14  ALT 8  ALKPHOS 125*  BILITOT 0.3  PROT 6.8  ALBUMIN 3.1*   No results found for this basename: LIPASE, AMYLASE,  in the last 168 hours No results found for this basename: AMMONIA,  in the last 168 hours CBC:  Recent Labs Lab 07/05/13 0810 07/05/13 1600 07/06/13 0412 07/07/13 0350 07/07/13 1340 07/08/13 0519  WBC 5.4 6.4 6.0 6.0 6.1 5.7  NEUTROABS 2.9  --   --   --   --   --   HGB 9.6* 10.7* 10.2* 8.7* 9.8* 8.4*  HCT 30.9* 35.0* 33.6* 28.6* 31.3* 27.5*  MCV 90.4 91.4 91.6 89.7 90.7 89.6  PLT 273 278 263 279 262 260   Cardiac Enzymes:  Recent Labs Lab 07/05/13 0810 07/05/13 1600 07/05/13 2050  TROPONINI <0.30 <0.30 <0.30   BNP: BNP (last 3 results) No results found for this basename: PROBNP,  in the last 8760 hours CBG:  Recent Labs Lab 07/07/13 1606 07/07/13 1642 07/07/13 2150 07/08/13 0732 07/08/13 1128  GLUCAP 53* 93 182* 78 114*    Time coordinating discharge: 45 minutes  Signed:  Janece Canterbury  [facility] 07/08/2013, 2:23 PM

## 2013-07-06 NOTE — Progress Notes (Signed)
ANTICOAGULATION CONSULT NOTE - Initial Consult  Pharmacy Consult for heparin Indication: chest pain/ACS  Allergies  Allergen Reactions  . Chlorhexidine Hives, Itching and Rash    This was most likely a CONTACT DERMATITIS versus true systemic allergic reaction  . Adhesive [Tape] Hives  . Codeine Swelling    Patient Measurements: Height: 5' 2.5" (158.8 cm) Weight: 201 lb 1.6 oz (91.218 kg) IBW/kg (Calculated) : 51.25 Heparin Dosing Weight: 75kg  Vital Signs: Temp: 97.8 F (36.6 C) (06/07 1213) Temp src: Oral (06/07 1213) BP: 156/48 mmHg (06/07 1213) Pulse Rate: 69 (06/07 1213)  Labs:  Recent Labs  07/05/13 0810 07/05/13 1600 07/05/13 2050 07/06/13 0412 07/06/13 1246  HGB 9.6* 10.7*  --  10.2*  --   HCT 30.9* 35.0*  --  33.6*  --   PLT 273 278  --  263  --   HEPARINUNFRC  --   --  <0.10* 0.36 0.17*  CREATININE 1.05 0.86  --   --   --   TROPONINI <0.30 <0.30 <0.30  --   --     Estimated Creatinine Clearance: 64.7 ml/min (by C-G formula based on Cr of 0.86).   Medical History: Past Medical History  Diagnosis Date  . Anemia   . Depression   . Diabetes mellitus type II   . GERD (gastroesophageal reflux disease)   . Hyperlipidemia   . Hypothyroidism   . Osteoarthritis   . Carotid artery stenosis     bilateral  . CAD (coronary artery disease)   . OSA (obstructive sleep apnea)   . Breast cancer     right breast cancer-invasive  ductal carcinoma (StageIV)  . Hypertension   . Anxiety   . Carotid artery occlusion   . Unspecified constipation 03/15/2012  . Bursitis   . Collagen vascular disease   . History of radiation therapy 10/31/12-11/13/12    rt&lt humerous 30Gy/83fx     Assessment: 72 YOF brought in with chest discomfort and dyspnea. First troponin negative. Getting myocardial imaging today. Repeat level came back low. Will adjust rate.   Goal of Therapy:  Heparin level 0.3-0.7 units/ml Monitor platelets by anticoagulation protocol: Yes   Plan:    Increase heparin to 1500 units/hr F/u with 6hr level

## 2013-07-06 NOTE — Progress Notes (Signed)
Pt was 214/60 upon arrival to nuc med, no acute complaints, EKG nonacute. Confirmed with Dr. Tamala Julian OK to proceed. During test, pt developed CP similar to admission pain, SOB, nausea, became tearful. EKG had minimal ST depression <0.35mm in V4 otherwise no significant ischemic changes. No arrhythmias. Due to persistent CP and significant hypertension (BP 241/82) we administered 2 SL NTG with slight improvement in BP and gradual improvement in CP. Dr. Tamala Julian also came down to round on patient down here. She has not gotten AM meds yet pre-nuc so nuc dept staff has called pharmacy for these to administer to help bring BP down. BP may be elevated due to disruption in BP schedule - she did not take her meds yesterday AM due to he commotion of coming to the ER, and was off schedule last night. Will need to monitor BP closely and consider med titration. Await nuc images. Dayna Dunn PA-C

## 2013-07-06 NOTE — Assessment & Plan Note (Signed)
On Levothyroxine, continue to monitor 

## 2013-07-06 NOTE — Assessment & Plan Note (Signed)
Well controlled, no changes to meds. Encouraged heart healthy diet such as the DASH diet and exercise as tolerated.  °

## 2013-07-06 NOTE — Assessment & Plan Note (Signed)
Sees oncology next week 

## 2013-07-07 ENCOUNTER — Encounter (HOSPITAL_COMMUNITY)
Admission: EM | Disposition: A | Discharge status: Home / Self Care | Payer: Self-pay | Source: Home / Self Care | Attending: Internal Medicine

## 2013-07-07 DIAGNOSIS — R9439 Abnormal result of other cardiovascular function study: Secondary | ICD-10-CM

## 2013-07-07 DIAGNOSIS — I2510 Atherosclerotic heart disease of native coronary artery without angina pectoris: Secondary | ICD-10-CM

## 2013-07-07 HISTORY — PX: LEFT HEART CATHETERIZATION WITH CORONARY ANGIOGRAM: SHX5451

## 2013-07-07 LAB — CBC
HCT: 31.3 % — ABNORMAL LOW (ref 36.0–46.0)
HEMATOCRIT: 28.6 % — AB (ref 36.0–46.0)
HEMOGLOBIN: 9.8 g/dL — AB (ref 12.0–15.0)
Hemoglobin: 8.7 g/dL — ABNORMAL LOW (ref 12.0–15.0)
MCH: 27.3 pg (ref 26.0–34.0)
MCH: 28.4 pg (ref 26.0–34.0)
MCHC: 30.4 g/dL (ref 30.0–36.0)
MCHC: 31.3 g/dL (ref 30.0–36.0)
MCV: 89.7 fL (ref 78.0–100.0)
MCV: 90.7 fL (ref 78.0–100.0)
Platelets: 279 10*3/uL (ref 150–400)
Platelets: 262 10*3/uL (ref 150–400)
RBC: 3.19 MIL/uL — ABNORMAL LOW (ref 3.87–5.11)
RBC: 3.45 MIL/uL — ABNORMAL LOW (ref 3.87–5.11)
RDW: 15.2 % (ref 11.5–15.5)
RDW: 15.1 % (ref 11.5–15.5)
WBC: 6 10*3/uL (ref 4.0–10.5)
WBC: 6.1 10*3/uL (ref 4.0–10.5)

## 2013-07-07 LAB — HEPARIN LEVEL (UNFRACTIONATED)
Heparin Unfractionated: 0.16 IU/mL — ABNORMAL LOW (ref 0.30–0.70)
Heparin Unfractionated: 0.5 IU/mL (ref 0.30–0.70)

## 2013-07-07 LAB — GLUCOSE, CAPILLARY
GLUCOSE-CAPILLARY: 74 mg/dL (ref 70–99)
GLUCOSE-CAPILLARY: 93 mg/dL (ref 70–99)
Glucose-Capillary: 65 mg/dL — ABNORMAL LOW (ref 70–99)
Glucose-Capillary: 53 mg/dL — ABNORMAL LOW (ref 70–99)

## 2013-07-07 LAB — HEMOGLOBIN A1C
HEMOGLOBIN A1C: 6.5 % — AB (ref <5.7)
Mean Plasma Glucose: 140 mg/dL — ABNORMAL HIGH (ref <117)

## 2013-07-07 LAB — PROTIME-INR
INR: 1.04 (ref 0.00–1.49)
Prothrombin Time: 13.4 seconds (ref 11.6–15.2)

## 2013-07-07 SURGERY — LEFT HEART CATHETERIZATION WITH CORONARY ANGIOGRAM
Anesthesia: LOCAL

## 2013-07-07 MED ORDER — GLUCOSE-VITAMIN C 4-6 GM-MG PO CHEW: 4.0000 | CHEWABLE_TABLET | ORAL | Status: DC | PRN

## 2013-07-07 MED ORDER — VERAPAMIL HCL 2.5 MG/ML IV SOLN
INTRAVENOUS | Status: AC
  Filled 2013-07-07: qty 2

## 2013-07-07 MED ORDER — DEXTROSE 50 % IV SOLN: 50.0000 mL | Freq: Once | INTRAVENOUS | Status: AC | PRN

## 2013-07-07 MED ORDER — LIDOCAINE HCL (PF) 1 % IJ SOLN
INTRAMUSCULAR | Status: AC
  Filled 2013-07-07: qty 30

## 2013-07-07 MED ORDER — HEPARIN SODIUM (PORCINE) 1000 UNIT/ML IJ SOLN
INTRAMUSCULAR | Status: AC
  Filled 2013-07-07: qty 1

## 2013-07-07 MED ORDER — ISOSORBIDE MONONITRATE ER 30 MG PO TB24
30.0000 mg | ORAL_TABLET | Freq: Every day | ORAL | Status: DC
  Administered 2013-07-07: 30 mg via ORAL
  Filled 2013-07-07 (×2): qty 1

## 2013-07-07 MED ORDER — SODIUM CHLORIDE 0.9 % IV SOLN
1.0000 mL/kg/h | INTRAVENOUS | Status: AC
  Administered 2013-07-07: 1 mL/kg/h via INTRAVENOUS

## 2013-07-07 MED ORDER — MIDAZOLAM HCL 2 MG/2ML IJ SOLN
INTRAMUSCULAR | Status: AC
  Filled 2013-07-07: qty 2

## 2013-07-07 MED ORDER — INSULIN GLARGINE 100 UNIT/ML ~~LOC~~ SOLN
50.0000 [IU] | Freq: Every day | SUBCUTANEOUS | Status: DC
  Administered 2013-07-07: 50 [IU] via SUBCUTANEOUS
  Filled 2013-07-07 (×2): qty 0.5

## 2013-07-07 MED ORDER — HEPARIN BOLUS VIA INFUSION
2500.0000 [IU] | Freq: Once | INTRAVENOUS | Status: AC
  Administered 2013-07-07: 2500 [IU] via INTRAVENOUS
  Filled 2013-07-07: qty 2500

## 2013-07-07 MED ORDER — DEXTROSE 50 % IV SOLN
25.0000 mL | Freq: Once | INTRAVENOUS | Status: AC | PRN
  Administered 2013-07-07: 25 mL via INTRAVENOUS
  Filled 2013-07-07: qty 50

## 2013-07-07 MED ORDER — HEPARIN (PORCINE) IN NACL 2-0.9 UNIT/ML-% IJ SOLN
INTRAMUSCULAR | Status: AC
  Filled 2013-07-07: qty 1500

## 2013-07-07 MED ORDER — SODIUM CHLORIDE 0.9 % IV SOLN: 250.0000 mL | INTRAVENOUS | Status: DC | PRN

## 2013-07-07 MED ORDER — SODIUM CHLORIDE 0.9 % IJ SOLN
3.0000 mL | Freq: Two times a day (BID) | INTRAMUSCULAR | Status: DC
  Administered 2013-07-08: 3 mL via INTRAVENOUS

## 2013-07-07 MED ORDER — SODIUM CHLORIDE 0.9 % IJ SOLN: 3.0000 mL | INTRAMUSCULAR | Status: DC | PRN

## 2013-07-07 MED ORDER — FENTANYL CITRATE 0.05 MG/ML IJ SOLN
INTRAMUSCULAR | Status: AC
  Filled 2013-07-07: qty 2

## 2013-07-07 MED ORDER — ONDANSETRON HCL 4 MG/2ML IJ SOLN: 4.0000 mg | Freq: Four times a day (QID) | INTRAMUSCULAR | Status: DC | PRN

## 2013-07-07 MED ORDER — HYDRALAZINE HCL 20 MG/ML IJ SOLN
10.0000 mg | INTRAMUSCULAR | Status: DC | PRN
  Administered 2013-07-07: 10 mg via INTRAVENOUS
  Filled 2013-07-07: qty 1

## 2013-07-07 NOTE — CV Procedure (Signed)
    Cardiac Catheterization Procedure Note  Name: Brittney Cunningham MRN: [id number] DOB: [birth year]  Procedure: Left Heart Cath, Selective Coronary Angiography, LV angiography  Indication: Unstable angina   Procedural Details: The left wrist was prepped, draped, and anesthetized with 1% lidocaine. Using the modified Seldinger technique, a 5 French sheath was introduced into the left radial artery. 3 mg of verapamil was administered through the sheath, weight-based unfractionated heparin was administered intravenously. Standard Judkins catheters were used for selective coronary angiography and left ventriculography. Catheter exchanges were performed over an exchange length guidewire. There were no immediate procedural complications. A TR band was used for radial hemostasis at the completion of the procedure.  The patient was transferred to the post catheterization recovery area for further monitoring.  Procedural Findings: Hemodynamics: AO 194/65 LV 186/12  Coronary angiography: Coronary dominance: right  Left mainstem: The left main stem is widely patent. There is no obstructive disease noted. The vessel divides into the LAD and left circumflex.  Left anterior descending (LAD): The LAD is patent to the left ventricular apex. There is minor luminal irregularity but no significant stenoses are noted. The diagonal branches are small with nonobstructive disease noted. The distal RCA branches fill from septal perforators of the LAD.  Left circumflex (LCx): The left circumflex has 75  percent stenosis at the ostium. The OM branches are with diffuse nonobstructive stenosis.  Right coronary artery (RCA): The right coronary artery is totally occluded proximally. The vessel fills from left to right collaterals.  Left ventriculography: Left ventricular systolic function is vigorous, LVEF is estimated at 65-75%, there is no significant mitral regurgitation   Final Conclusions:   1. Total occlusion  of the RCA, chronic 2. Patency of the left main and LAD 3. Moderately severe ostial left circumflex stenosis  Recommendations: Would favor medical therapy. I think the ostial left circumflex stenosis is hemodynamically significant. However, the lesion is unfavorable for PCI and would involve complex stenting.  Sherren Mocha 07/07/2013, 5:54 PM

## 2013-07-07 NOTE — Progress Notes (Signed)
07-07-13 @ 1140 Initial Medicare IM signed by patient and signed copy left on chart. Ocie Cornfield [place], RN,BSN [phone]

## 2013-07-07 NOTE — Progress Notes (Signed)
Multi-zone ischemia on nuclear. Expected RCA distribution ischemia since RCA functionally occluded. Anterior ischemia is worrisome.  Plan cath today with treatment appropriate for patient with metastatic breast cancer. Not really a candidate for CABG. Discussed with patient and family.

## 2013-07-07 NOTE — Progress Notes (Signed)
ANTICOAGULATION CONSULT NOTE - Follow Up Consult  Pharmacy Consult for heparin  Indication: chest pain acs  Allergies  Allergen Reactions  . Chlorhexidine Hives, Itching and Rash    This was most likely a CONTACT DERMATITIS versus true systemic allergic reaction  . Adhesive [Tape] Hives  . Codeine Swelling    Patient Measurements: Height: 5' 2.5" (158.8 cm) Weight: 201 lb 1.6 oz (91.218 kg) IBW/kg (Calculated) : 51.25 Heparin Dosing Weight:   Vital Signs: Temp: 98.8 F (37.1 C) (06/07 2100) BP: 153/41 mmHg (06/07 2100) Pulse Rate: 71 (06/07 2100)  Labs:  Recent Labs  07/05/13 0810 07/05/13 1600  07/05/13 2050 07/06/13 0412 07/06/13 1246 07/06/13 2005 07/07/13 0350  HGB 9.6* 10.7*  --   --  10.2*  --   --  8.7*  HCT 30.9* 35.0*  --   --  33.6*  --   --  28.6*  PLT 273 278  --   --  263  --   --  279  LABPROT  --   --   --   --   --   --  13.4  --   INR  --   --   --   --   --   --  1.04  --   HEPARINUNFRC  --   --   < > <0.10* 0.36 0.17* 0.27* 0.16*  CREATININE 1.05 0.86  --   --   --   --   --   --   TROPONINI <0.30 <0.30  --  <0.30  --   --   --   --   < > = values in this interval not displayed.  Estimated Creatinine Clearance: 64.7 ml/min (by C-G formula based on Cr of 0.86).   Medications:  Heparin at 1500/hr   Assessment: Heparin is again subtherapeutic. No bleeding noted. No infusion interruptions  Goal of Therapy:  Heparin level 0.3-0.7 units/ml Monitor platelets by anticoagulation protocol: Yes   Plan:  Give 2500 unit bolus and increase to 1650 units/hr  F/u HL in 8 hours.   Curlene Dolphin 07/07/2013,5:26 AM

## 2013-07-07 NOTE — Interval H&P Note (Signed)
History and Physical Interval Note:  07/07/2013 5:08 PM  Brittney Cunningham  has presented today for surgery, with the diagnosis of chest pain  The various methods of treatment have been discussed with the patient and family. After consideration of risks, benefits and other options for treatment, the patient has consented to  Procedure(s): LEFT HEART CATHETERIZATION WITH CORONARY ANGIOGRAM (N/A) as a surgical intervention .  The patient's history has been reviewed, patient examined, no change in status, stable for surgery.  I have reviewed the patient's chart and labs.  Questions were answered to the patient's satisfaction.    Cath Lab Visit (complete for each Cath Lab visit)  Clinical Evaluation Leading to the Procedure:   ACS: yes  Non-ACS:    Anginal Classification: CCS III  Anti-ischemic medical therapy: Minimal Therapy (1 class of medications)  Non-Invasive Test Results: No non-invasive testing performed  Prior CABG: No previous CABG       Sherren Mocha

## 2013-07-07 NOTE — Progress Notes (Signed)
Subjective: Mild SOB, no chest pain  Objective: Vital signs in last 24 hours: Temp:  [97.8 F (36.6 C)-98.8 F (37.1 C)] 98.5 F (36.9 C) (06/08 0400) Pulse Rate:  [67-100] 70 (06/08 0814) Resp:  [16-20] 18 (06/08 0400) BP: (151-241)/(33-82) 151/54 mmHg (06/08 0814) SpO2:  [97 %-99 %] 97 % (06/08 0400) Weight:  [201 lb (91.173 kg)] 201 lb (91.173 kg) (06/08 0400) Weight change: -1 lb (-0.454 kg) Last BM Date: 07/06/13 Intake/Output from previous day: 06/07 0701 - 06/08 0700 In: 360 [P.O.:360] Out: -  Intake/Output this shift:    PE: General:Pleasant affect, NAD Skin:Warm and dry, brisk capillary refill HEENT:normocephalic, sclera clear, mucus membranes moist Heart:S1S2 RRR without murmur, gallup, rub or click Lungs:clear without rales, rhonchi, or wheezes GGE:ZMOQ, non tender, + BS, do not palpate liver spleen or masses Ext:tr lower ext edema, 2+ pedal pulses, 2+ radial pulses Neuro:alert and oriented, MAE, follows commands, + facial symmetry  tele:  SR with PACs  Lab Results:  Recent Labs  07/06/13 0412 07/07/13 0350  WBC 6.0 6.0  HGB 10.2* 8.7*  HCT 33.6* 28.6*  PLT 263 279   BMET  Recent Labs  07/05/13 0810 07/05/13 1600  NA 138  --   K 4.5  --   CL 103  --   CO2 18*  --   GLUCOSE 124*  --   BUN 31*  --   CREATININE 1.05 0.86  CALCIUM 9.4  --     Recent Labs  07/05/13 1600 07/05/13 2050  TROPONINI <0.30 <0.30    Lab Results  Component Value Date   CHOL 130 07/03/2013   HDL 22* 07/03/2013   LDLCALC 80 07/03/2013   TRIG 142 07/03/2013   CHOLHDL 5.9 07/03/2013   Lab Results  Component Value Date   HGBA1C 7.8* 06/26/2012     Lab Results  Component Value Date   TSH 0.045* 07/03/2013    Hepatic Function Panel  Recent Labs  07/05/13 0810  PROT 6.8  ALBUMIN 3.1*  AST 14  ALT 8  ALKPHOS 125*  BILITOT 0.3   No results found for this basename: CHOL,  in the last 72 hours No results found for this basename: PROTIME,  in the  last 72 hours     Studies/Results: Dg Chest 2 View  07/05/2013   CLINICAL DATA:  Chest pain  EXAM: CHEST  2 VIEW  COMPARISON:  PET-CT dated 10/15/2012  FINDINGS: Chronic interstitial markings. Superimposed mild patchy opacity in the right mid lung, possibly reflecting pneumonia. Suspected small right pleural effusion with associated right lower lobe opacity, likely atelectasis.  The heart is normal in size.  Right chest port.  Widespread sclerotic osseous metastases.  IMPRESSION: Mild patchy opacity in the right mid lung, possibly reflecting pneumonia in the appropriate clinical setting. Follow-up chest radiographs are suggested to document clearance. In the absence of infectious symptoms, consider CT chest with contrast.  Suspected small right pleural effusion with associated right lower lobe opacity, likely atelectasis.  Widespread sclerotic osseous metastases.   Electronically Signed   By: Julian Hy M.D.   On: 07/05/2013 10:35   Ct Angio Chest Pe W/cm &/or Wo Cm  07/05/2013   CLINICAL DATA:  Sub sternal chest pain. Elevated D-dimer. History breast cancer with metastatic disease to the bones.  EXAM: CT ANGIOGRAPHY CHEST WITH CONTRAST  TECHNIQUE: Multidetector CT imaging of the chest was performed using the standard protocol during bolus administration of intravenous contrast.  Multiplanar CT image reconstructions and MIPs were obtained to evaluate the vascular anatomy.  CONTRAST:  61mL OMNIPAQUE IOHEXOL 350 MG/ML SOLN  COMPARISON:  PET-CT 10/15/2012.  FINDINGS: Mediastinum: There are no filling defects within the pulmonary arterial tree to suggest underlying pulmonary embolism. Heart size is mildly enlarged. There is no significant pericardial fluid, thickening or pericardial calcification. There is atherosclerosis of the thoracic aorta, the great vessels of the mediastinum and the coronary arteries, including calcified atherosclerotic plaque in the left main, left anterior descending, left  circumflex and right coronary arteries. No pathologically enlarged mediastinal, hilar or internal mammary lymph nodes. Esophagus is unremarkable in appearance. Right single-lumen subclavian Port-A-Cath with tip terminating in the right atrium.  Lungs/Pleura: 9 mm pleural-based nodule in the periphery of the right lower lobe (image 62 of series 4), unchanged compared to 10/15/2012. 4 mm left upper lobe nodule (image 30 of series 6) is also unchanged. No other larger more suspicious appearing pulmonary nodules or masses are noted. There is a background of mild to moderate centrilobular and paraseptal emphysema, most apparent throughout the lung apices. Mild diffuse bronchial wall thickening. Patchy areas of mild pleural thickening are again noted in the right hemithorax, without other areas of definite pleural nodularity. Mild subpleural reticulation throughout the anterior aspect of the right upper and middle lobes, presumably related to prior radiation therapy.  Upper Abdomen: Extensive atherosclerosis.  Otherwise, unremarkable.  Musculoskeletal: Status post right modified radical mastectomy and right axillary nodal dissection. Widespread predominantly sclerotic lesions throughout all portions of the visualized axial and appendicular skeleton, compatible with widespread metastatic disease.  Review of the MIP images confirms the above findings.  IMPRESSION: 1. No evidence of pulmonary embolism. 2. Widespread metastatic disease to the bones redemonstrated. Status post modified radical right-sided mastectomy and right axillary nodal dissection, without findings to suggest local recurrence of disease. 3. Small pulmonary nodules in the lungs bilaterally are unchanged compared to prior examinations. Continued attention on followup studies is recommended. 4. Mild to moderate cardiomegaly. 5. Atherosclerosis, including left main and 3 vessel coronary artery disease. Please Assessment for potential risk factor modification,  dietary therapy or pharmacologic therapy may be warranted, if clinically indicated. 6. Additional incidental findings, as above.   Electronically Signed   By: Vinnie Langton M.D.   On: 07/05/2013 13:06   Nm Myocar Multi W/spect W/wall Motion / Ef  07/06/2013   CLINICAL DATA:  73 year old female with chest pain. Additional risk factors include diabetes and known coronary artery disease.  EXAM: MYOCARDIAL IMAGING WITH SPECT (REST AND PHARMACOLOGIC-STRESS)  GATED LEFT VENTRICULAR WALL MOTION STUDY  LEFT VENTRICULAR EJECTION FRACTION  TECHNIQUE: Standard myocardial SPECT imaging was performed after resting intravenous injection of 10 mCi Tc-78m sestamibi. Subsequently, intravenous infusion of Lexiscan was performed under the supervision of the Cardiology staff. At peak effect of the drug, 30 mCi Tc-67m sestamibi was injected intravenously and standard myocardial SPECT imaging was performed. Quantitative gated imaging was also performed to evaluate left ventricular wall motion, and estimate left ventricular ejection fraction.  COMPARISON:  CT PE study and chest x-ray 07/05/2013  FINDINGS: Evaluation of the cardiac gated data demonstrates an end-diastolic volume of 073 mL and an end systolic volume of 43 mL yielding a calculated ejection fraction of 61%. No evidence of focal or global cardiac wall motion abnormality.  Evaluation of the static resting and post pharmacological stress images demonstrates relatively decreased radiotracer uptake in the mid ventricular and apical anterolateral and inferior walls on the post pharmacological stress images  concerning for 2 small areas of inducible ischemia. A fixed defect of a decreased radiotracer uptake in the inferolateral wall is also present. No transient ischemic dilatation or other acute abnormality.  IMPRESSION: 1. Positive for small foci of inducible ischemia in the anterolateral and inferior walls of the mid ventricle and apex. 2. Small fixed defect in the mid  ventricular inferolateral wall consistent with a region of remote infarct/scarring. 3. Calculated ejection fraction 61%. 4. No significant cardiac wall motion abnormality.   Electronically Signed   By: Jacqulynn Cadet M.D.   On: 07/06/2013 13:41   2 D ECHO: Left ventricle: The cavity size was normal. Systolic function was normal. The estimated ejection fraction was in the range of 55% to 60%. Wall motion was normal; there were no regional wall motion abnormalities. There was an increased relative contribution of atrial contraction to ventricular filling. Doppler parameters are consistent with abnormal left ventricular relaxation (grade 1 diastolic dysfunction). - Aortic valve: Moderate diffuse thickening and calcification, consistent with sclerosis. - Mitral valve: Calcified annulus. There was moderate regurgitation. - Right atrium: The atrium was mildly dilated. - Atrial septum: There was increased thickness of the septum, consistent with lipomatous hypertrophy. - Tricuspid valve: There was mild regurgitation. No Pericardial effusion   Medications: I have reviewed the patient's current medications. Scheduled Meds: . aspirin  81 mg Oral Daily  . atorvastatin  40 mg Oral Daily  . carvedilol  25 mg Oral BID WC  . clopidogrel  75 mg Oral Daily  . hydrochlorothiazide  25 mg Oral Daily  . insulin glargine  60 Units Subcutaneous QHS  . levothyroxine  200 mcg Oral QAC breakfast  . levothyroxine  75 mcg Oral Once per day on Mon Fri  . losartan  50 mg Oral Daily  . morphine  30 mg Oral BID   Continuous Infusions: . sodium chloride 1,000 mL (07/06/13 0745)  . sodium chloride 1 mL/kg/hr (07/07/13 0430)  . heparin 1,650 Units/hr (07/07/13 4128)   PRN Meds:.acetaminophen, gi cocktail, morphine injection, nitroGLYCERIN, polyethylene glycol  Assessment/Plan: Principal Problem:   Chest pain- abnormal nuc study will plan for cardiac cath for today--neg MI Active Problems:    HYPOTHYROIDISM   DIABETES MELLITUS, TYPE II- glucose stable   HYPERLIPIDEMIA   ANEMIA   HYPERTENSION   GERD   CAROTID ARTERY STENOSIS   Breast cancer metastasized to bone   CAD (coronary artery disease), native coronary artery- CAD disease seen on CTA of chest   Anemia, H/H dropped from 10.2/33.6 to 8.7/28.6 though 9.6 hgb on admit looking at outpt labs H/H 9.6/30    LOS: 2 days   Time spent with pt. :15 minutes. Cecilie Kicks  Nurse Practitioner Certified Pager [phone] or after 5pm and on weekends call [phone] 07/07/2013, 9:22 AM

## 2013-07-07 NOTE — Progress Notes (Signed)
ANTICOAGULATION CONSULT NOTE - Follow Up Consult  Pharmacy Consult for heparin  Indication: chest pain acs  Allergies  Allergen Reactions  . Chlorhexidine Hives, Itching and Rash    This was most likely a CONTACT DERMATITIS versus true systemic allergic reaction  . Adhesive [Tape] Hives  . Codeine Swelling    Patient Measurements: Height: 5' 2.5" (158.8 cm) Weight: 201 lb (91.173 kg) IBW/kg (Calculated) : 51.25  Vital Signs: Temp: 98.3 F (36.8 C) (06/08 1354) Temp src: Oral (06/08 1354) BP: 161/61 mmHg (06/08 1354) Pulse Rate: 66 (06/08 1354)  Labs:  Recent Labs  07/05/13 0810 07/05/13 1600  07/05/13 2050 07/06/13 0412  07/06/13 2005 07/07/13 0350 07/07/13 1340  HGB 9.6* 10.7*  --   --  10.2*  --   --  8.7* 9.8*  HCT 30.9* 35.0*  --   --  33.6*  --   --  28.6* 31.3*  PLT 273 278  --   --  263  --   --  279 262  LABPROT  --   --   --   --   --   --  13.4  --   --   INR  --   --   --   --   --   --  1.04  --   --   HEPARINUNFRC  --   --   < > <0.10* 0.36  < > 0.27* 0.16* 0.50  CREATININE 1.05 0.86  --   --   --   --   --   --   --   TROPONINI <0.30 <0.30  --  <0.30  --   --   --   --   --   < > = values in this interval not displayed.  Estimated Creatinine Clearance: 64.7 ml/min (by C-G formula based on Cr of 0.86).   Medications:  Heparin at 1650 units/hr   Assessment: 13 yof continues on IV heparin for CP. Heparin is now therapeutic at 0.5. CBC is stable and no bleeding noted. Pt is going to cath this afternoon.   Goal of Therapy:  Heparin level 0.3-0.7 units/ml Monitor platelets by anticoagulation protocol: Yes   Plan:  1. Continue heparin gtt 1650 units/hr 2. F/u post-cath  Salome Arnt, PharmD, BCPS Pager # [phone] 07/07/2013 2:13 PM

## 2013-07-07 NOTE — H&P (View-Only) (Signed)
Subjective: Mild SOB, no chest pain  Objective: Vital signs in last 24 hours: Temp:  [97.8 F (36.6 C)-98.8 F (37.1 C)] 98.5 F (36.9 C) (06/08 0400) Pulse Rate:  [67-100] 70 (06/08 0814) Resp:  [16-20] 18 (06/08 0400) BP: (151-241)/(33-82) 151/54 mmHg (06/08 0814) SpO2:  [97 %-99 %] 97 % (06/08 0400) Weight:  [201 lb (91.173 kg)] 201 lb (91.173 kg) (06/08 0400) Weight change: -1 lb (-0.454 kg) Last BM Date: 07/06/13 Intake/Output from previous day: 06/07 0701 - 06/08 0700 In: 360 [P.O.:360] Out: -  Intake/Output this shift:    PE: General:Pleasant affect, NAD Skin:Warm and dry, brisk capillary refill HEENT:normocephalic, sclera clear, mucus membranes moist Heart:S1S2 RRR without murmur, gallup, rub or click Lungs:clear without rales, rhonchi, or wheezes WOE:HOZY, non tender, + BS, do not palpate liver spleen or masses Ext:tr lower ext edema, 2+ pedal pulses, 2+ radial pulses Neuro:alert and oriented, MAE, follows commands, + facial symmetry  tele:  SR with PACs  Lab Results:  Recent Labs  07/06/13 0412 07/07/13 0350  WBC 6.0 6.0  HGB 10.2* 8.7*  HCT 33.6* 28.6*  PLT 263 279   BMET  Recent Labs  07/05/13 0810 07/05/13 1600  NA 138  --   K 4.5  --   CL 103  --   CO2 18*  --   GLUCOSE 124*  --   BUN 31*  --   CREATININE 1.05 0.86  CALCIUM 9.4  --     Recent Labs  07/05/13 1600 07/05/13 2050  TROPONINI <0.30 <0.30    Lab Results  Component Value Date   CHOL 130 07/03/2013   HDL 22* 07/03/2013   LDLCALC 80 07/03/2013   TRIG 142 07/03/2013   CHOLHDL 5.9 07/03/2013   Lab Results  Component Value Date   HGBA1C 7.8* 06/26/2012     Lab Results  Component Value Date   TSH 0.045* 07/03/2013    Hepatic Function Panel  Recent Labs  07/05/13 0810  PROT 6.8  ALBUMIN 3.1*  AST 14  ALT 8  ALKPHOS 125*  BILITOT 0.3   No results found for this basename: CHOL,  in the last 72 hours No results found for this basename: PROTIME,  in the  last 72 hours     Studies/Results: Dg Chest 2 View  07/05/2013   CLINICAL DATA:  Chest pain  EXAM: CHEST  2 VIEW  COMPARISON:  PET-CT dated 10/15/2012  FINDINGS: Chronic interstitial markings. Superimposed mild patchy opacity in the right mid lung, possibly reflecting pneumonia. Suspected small right pleural effusion with associated right lower lobe opacity, likely atelectasis.  The heart is normal in size.  Right chest port.  Widespread sclerotic osseous metastases.  IMPRESSION: Mild patchy opacity in the right mid lung, possibly reflecting pneumonia in the appropriate clinical setting. Follow-up chest radiographs are suggested to document clearance. In the absence of infectious symptoms, consider CT chest with contrast.  Suspected small right pleural effusion with associated right lower lobe opacity, likely atelectasis.  Widespread sclerotic osseous metastases.   Electronically Signed   By: Julian Hy M.D.   On: 07/05/2013 10:35   Ct Angio Chest Pe W/cm &/or Wo Cm  07/05/2013   CLINICAL DATA:  Sub sternal chest pain. Elevated D-dimer. History breast cancer with metastatic disease to the bones.  EXAM: CT ANGIOGRAPHY CHEST WITH CONTRAST  TECHNIQUE: Multidetector CT imaging of the chest was performed using the standard protocol during bolus administration of intravenous contrast.  Multiplanar CT image reconstructions and MIPs were obtained to evaluate the vascular anatomy.  CONTRAST:  59mL OMNIPAQUE IOHEXOL 350 MG/ML SOLN  COMPARISON:  PET-CT 10/15/2012.  FINDINGS: Mediastinum: There are no filling defects within the pulmonary arterial tree to suggest underlying pulmonary embolism. Heart size is mildly enlarged. There is no significant pericardial fluid, thickening or pericardial calcification. There is atherosclerosis of the thoracic aorta, the great vessels of the mediastinum and the coronary arteries, including calcified atherosclerotic plaque in the left main, left anterior descending, left  circumflex and right coronary arteries. No pathologically enlarged mediastinal, hilar or internal mammary lymph nodes. Esophagus is unremarkable in appearance. Right single-lumen subclavian Port-A-Cath with tip terminating in the right atrium.  Lungs/Pleura: 9 mm pleural-based nodule in the periphery of the right lower lobe (image 62 of series 4), unchanged compared to 10/15/2012. 4 mm left upper lobe nodule (image 30 of series 6) is also unchanged. No other larger more suspicious appearing pulmonary nodules or masses are noted. There is a background of mild to moderate centrilobular and paraseptal emphysema, most apparent throughout the lung apices. Mild diffuse bronchial wall thickening. Patchy areas of mild pleural thickening are again noted in the right hemithorax, without other areas of definite pleural nodularity. Mild subpleural reticulation throughout the anterior aspect of the right upper and middle lobes, presumably related to prior radiation therapy.  Upper Abdomen: Extensive atherosclerosis.  Otherwise, unremarkable.  Musculoskeletal: Status post right modified radical mastectomy and right axillary nodal dissection. Widespread predominantly sclerotic lesions throughout all portions of the visualized axial and appendicular skeleton, compatible with widespread metastatic disease.  Review of the MIP images confirms the above findings.  IMPRESSION: 1. No evidence of pulmonary embolism. 2. Widespread metastatic disease to the bones redemonstrated. Status post modified radical right-sided mastectomy and right axillary nodal dissection, without findings to suggest local recurrence of disease. 3. Small pulmonary nodules in the lungs bilaterally are unchanged compared to prior examinations. Continued attention on followup studies is recommended. 4. Mild to moderate cardiomegaly. 5. Atherosclerosis, including left main and 3 vessel coronary artery disease. Please Assessment for potential risk factor modification,  dietary therapy or pharmacologic therapy may be warranted, if clinically indicated. 6. Additional incidental findings, as above.   Electronically Signed   By: Vinnie Langton M.D.   On: 07/05/2013 13:06   Nm Myocar Multi W/spect W/wall Motion / Ef  07/06/2013   CLINICAL DATA:  73 year old female with chest pain. Additional risk factors include diabetes and known coronary artery disease.  EXAM: MYOCARDIAL IMAGING WITH SPECT (REST AND PHARMACOLOGIC-STRESS)  GATED LEFT VENTRICULAR WALL MOTION STUDY  LEFT VENTRICULAR EJECTION FRACTION  TECHNIQUE: Standard myocardial SPECT imaging was performed after resting intravenous injection of 10 mCi Tc-19m sestamibi. Subsequently, intravenous infusion of Lexiscan was performed under the supervision of the Cardiology staff. At peak effect of the drug, 30 mCi Tc-48m sestamibi was injected intravenously and standard myocardial SPECT imaging was performed. Quantitative gated imaging was also performed to evaluate left ventricular wall motion, and estimate left ventricular ejection fraction.  COMPARISON:  CT PE study and chest x-ray 07/05/2013  FINDINGS: Evaluation of the cardiac gated data demonstrates an end-diastolic volume of 962 mL and an end systolic volume of 43 mL yielding a calculated ejection fraction of 61%. No evidence of focal or global cardiac wall motion abnormality.  Evaluation of the static resting and post pharmacological stress images demonstrates relatively decreased radiotracer uptake in the mid ventricular and apical anterolateral and inferior walls on the post pharmacological stress images  concerning for 2 small areas of inducible ischemia. A fixed defect of a decreased radiotracer uptake in the inferolateral wall is also present. No transient ischemic dilatation or other acute abnormality.  IMPRESSION: 1. Positive for small foci of inducible ischemia in the anterolateral and inferior walls of the mid ventricle and apex. 2. Small fixed defect in the mid  ventricular inferolateral wall consistent with a region of remote infarct/scarring. 3. Calculated ejection fraction 61%. 4. No significant cardiac wall motion abnormality.   Electronically Signed   By: Jacqulynn Cadet M.D.   On: 07/06/2013 13:41   2 D ECHO: Left ventricle: The cavity size was normal. Systolic function was normal. The estimated ejection fraction was in the range of 55% to 60%. Wall motion was normal; there were no regional wall motion abnormalities. There was an increased relative contribution of atrial contraction to ventricular filling. Doppler parameters are consistent with abnormal left ventricular relaxation (grade 1 diastolic dysfunction). - Aortic valve: Moderate diffuse thickening and calcification, consistent with sclerosis. - Mitral valve: Calcified annulus. There was moderate regurgitation. - Right atrium: The atrium was mildly dilated. - Atrial septum: There was increased thickness of the septum, consistent with lipomatous hypertrophy. - Tricuspid valve: There was mild regurgitation. No Pericardial effusion   Medications: I have reviewed the patient's current medications. Scheduled Meds: . aspirin  81 mg Oral Daily  . atorvastatin  40 mg Oral Daily  . carvedilol  25 mg Oral BID WC  . clopidogrel  75 mg Oral Daily  . hydrochlorothiazide  25 mg Oral Daily  . insulin glargine  60 Units Subcutaneous QHS  . levothyroxine  200 mcg Oral QAC breakfast  . levothyroxine  75 mcg Oral Once per day on Mon Fri  . losartan  50 mg Oral Daily  . morphine  30 mg Oral BID   Continuous Infusions: . sodium chloride 1,000 mL (07/06/13 0745)  . sodium chloride 1 mL/kg/hr (07/07/13 0430)  . heparin 1,650 Units/hr (07/07/13 3354)   PRN Meds:.acetaminophen, gi cocktail, morphine injection, nitroGLYCERIN, polyethylene glycol  Assessment/Plan: Principal Problem:   Chest pain- abnormal nuc study will plan for cardiac cath for today--neg MI Active Problems:    HYPOTHYROIDISM   DIABETES MELLITUS, TYPE II- glucose stable   HYPERLIPIDEMIA   ANEMIA   HYPERTENSION   GERD   CAROTID ARTERY STENOSIS   Breast cancer metastasized to bone   CAD (coronary artery disease), native coronary artery- CAD disease seen on CTA of chest   Anemia, H/H dropped from 10.2/33.6 to 8.7/28.6 though 9.6 hgb on admit looking at outpt labs H/H 9.6/30    LOS: 2 days   Time spent with pt. :15 minutes. Cecilie Kicks  Nurse Practitioner Certified Pager [phone] or after 5pm and on weekends call [phone] 07/07/2013, 9:22 AM

## 2013-07-07 NOTE — H&P (View-Only) (Signed)
Multi-zone ischemia on nuclear. Expected RCA distribution ischemia since RCA functionally occluded. Anterior ischemia is worrisome.  Plan cath today with treatment appropriate for patient with metastatic breast cancer. Not really a candidate for CABG. Discussed with patient and family.

## 2013-07-07 NOTE — Progress Notes (Signed)
[facility] PROGRESS NOTE  Brittney Cunningham VHQ:[id number] DOB: [birth year] DOA: 07/05/2013 PCP: Penni Homans, MD  Assessment/Plan  Chest pain in setting of known CAD-cardiac catheterization in 2011 reviewed. -  Telemetry:  NSR -  Troponins negative -  Stress test positive. -  Cardiac catheterization today -  Continue ASA, plavix, BB, statin -  Continue Heparin -  Appreciate cardiology assistance -  Per cardiology, the patient would not be a good candidate for CABG  Malignant hypertension with very elevated BPs while in stress test -  Continue increased carvedilol -  Increase HCTZ -  Continue losartan -  Consider addition of norvasc/hydralazine/imdur/clonidine as needed, but will defer until post catheterization for now  Anemia, likely secondary to chronic disease-breast cancer, hemoglobin dropped this morning, but no evidence of acute bleeding -  Gets darbepoetin infusion as an outpatient.  -  Monitor for evidence of GI bleed while on heparin  CAROTID ARTERY STENOSIS s/p right carotid endarterectomy, and stenting of the left carotid.  -  Continue antiplatelet agents and statin   Breast cancer metastasized to bone  - Outpatient follow up with oncology, reviewed recent oncology notes - disease he seems to be largely controlled.   DIABETES MELLITUS, TYPE II CBG low normal this morning - Decrease Lantus -  Continue SSI.  -  A1c with AML  GERD, stable.  Continue PPI, as needed GI cocktail.   HYPERLIPIDEMIA, stable, continue statin.   HYPOTHYROIDISM, recent TSH was 0.045 so synthroid dose was decreased a few days ago by PCP. - Continue levothyroxine at current dose and f/u with PCP in 4 weeks for repeat TSH  Diet:  diabetic Access:  PIV IVF:  yes Proph:  heparin  Code Status: full Family Communication: patient and husband Disposition Plan: pending cardiac catheterization Consultants:  Cardiology  Procedures:  NM stress test  Antibiotics:  none    HPI/Subjective:  No further episodes of chest pain, feels well except she is hungry.  Objective: Filed Vitals:   07/06/13 1600 07/06/13 2100 07/07/13 0400 07/07/13 0814  BP: 162/51 153/41 154/46 151/54  Pulse: 67 71 76 70  Temp: 98 F (36.7 C) 98.8 F (37.1 C) 98.5 F (36.9 C)   TempSrc: Oral     Resp: 16 20 18    Height:      Weight:   91.173 kg (201 lb)   SpO2: 97% 97% 97%     Intake/Output Summary (Last 24 hours) at 07/07/13 1021 Last data filed at 07/06/13 1335  Gross per 24 hour  Intake    360 ml  Output      0 ml  Net    360 ml   Filed Weights   07/05/13 1315 07/06/13 0339 07/07/13 0400  Weight: 89.9 kg (198 lb 3.1 oz) 91.218 kg (201 lb 1.6 oz) 91.173 kg (201 lb)    Exam:   General:  Caucasian female, No acute distress, stable from yesterday  HEENT:  NCAT, MMM  Cardiovascular:  RRR, nl S1, S2 no mrg, 2+ pulses, warm extremities  Respiratory:  CTAB, no increased WOB  Abdomen:   NABS, soft, NT/ND  MSK:   Normal tone and bulk, no LEE  Neuro:  Grossly intact  Data Reviewed: Basic Metabolic Panel:  Recent Labs Lab 07/05/13 0810 07/05/13 1600  NA 138  --   K 4.5  --   CL 103  --   CO2 18*  --   GLUCOSE 124*  --   BUN 31*  --  CREATININE 1.05 0.86  CALCIUM 9.4  --    Liver Function Tests:  Recent Labs Lab 07/05/13 0810  AST 14  ALT 8  ALKPHOS 125*  BILITOT 0.3  PROT 6.8  ALBUMIN 3.1*   No results found for this basename: LIPASE, AMYLASE,  in the last 168 hours No results found for this basename: AMMONIA,  in the last 168 hours CBC:  Recent Labs Lab 07/05/13 0810 07/05/13 1600 07/06/13 0412 07/07/13 0350  WBC 5.4 6.4 6.0 6.0  NEUTROABS 2.9  --   --   --   HGB 9.6* 10.7* 10.2* 8.7*  HCT 30.9* 35.0* 33.6* 28.6*  MCV 90.4 91.4 91.6 89.7  PLT 273 278 263 279   Cardiac Enzymes:  Recent Labs Lab 07/05/13 0810 07/05/13 1600 07/05/13 2050  TROPONINI <0.30 <0.30 <0.30   BNP (last 3 results) No results found for this  basename: PROBNP,  in the last 8760 hours CBG:  Recent Labs Lab 07/06/13 0736 07/06/13 1207 07/06/13 1642 07/06/13 2248 07/07/13 0727  GLUCAP 84 135* 123* 147* 74    No results found for this or any previous visit (from the past 240 hour(s)).   Studies: Ct Angio Chest Pe W/cm &/or Wo Cm  07/05/2013   CLINICAL DATA:  Sub sternal chest pain. Elevated D-dimer. History breast cancer with metastatic disease to the bones.  EXAM: CT ANGIOGRAPHY CHEST WITH CONTRAST  TECHNIQUE: Multidetector CT imaging of the chest was performed using the standard protocol during bolus administration of intravenous contrast. Multiplanar CT image reconstructions and MIPs were obtained to evaluate the vascular anatomy.  CONTRAST:  74mL OMNIPAQUE IOHEXOL 350 MG/ML SOLN  COMPARISON:  PET-CT 10/15/2012.  FINDINGS: Mediastinum: There are no filling defects within the pulmonary arterial tree to suggest underlying pulmonary embolism. Heart size is mildly enlarged. There is no significant pericardial fluid, thickening or pericardial calcification. There is atherosclerosis of the thoracic aorta, the great vessels of the mediastinum and the coronary arteries, including calcified atherosclerotic plaque in the left main, left anterior descending, left circumflex and right coronary arteries. No pathologically enlarged mediastinal, hilar or internal mammary lymph nodes. Esophagus is unremarkable in appearance. Right single-lumen subclavian Port-A-Cath with tip terminating in the right atrium.  Lungs/Pleura: 9 mm pleural-based nodule in the periphery of the right lower lobe (image 62 of series 4), unchanged compared to 10/15/2012. 4 mm left upper lobe nodule (image 30 of series 6) is also unchanged. No other larger more suspicious appearing pulmonary nodules or masses are noted. There is a background of mild to moderate centrilobular and paraseptal emphysema, most apparent throughout the lung apices. Mild diffuse bronchial wall thickening.  Patchy areas of mild pleural thickening are again noted in the right hemithorax, without other areas of definite pleural nodularity. Mild subpleural reticulation throughout the anterior aspect of the right upper and middle lobes, presumably related to prior radiation therapy.  Upper Abdomen: Extensive atherosclerosis.  Otherwise, unremarkable.  Musculoskeletal: Status post right modified radical mastectomy and right axillary nodal dissection. Widespread predominantly sclerotic lesions throughout all portions of the visualized axial and appendicular skeleton, compatible with widespread metastatic disease.  Review of the MIP images confirms the above findings.  IMPRESSION: 1. No evidence of pulmonary embolism. 2. Widespread metastatic disease to the bones redemonstrated. Status post modified radical right-sided mastectomy and right axillary nodal dissection, without findings to suggest local recurrence of disease. 3. Small pulmonary nodules in the lungs bilaterally are unchanged compared to prior examinations. Continued attention on followup studies is recommended. 4.  Mild to moderate cardiomegaly. 5. Atherosclerosis, including left main and 3 vessel coronary artery disease. Please Assessment for potential risk factor modification, dietary therapy or pharmacologic therapy may be warranted, if clinically indicated. 6. Additional incidental findings, as above.   Electronically Signed   By: Vinnie Langton M.D.   On: 07/05/2013 13:06   Nm Myocar Multi W/spect W/wall Motion / Ef  07/06/2013   CLINICAL DATA:  73 year old female with chest pain. Additional risk factors include diabetes and known coronary artery disease.  EXAM: MYOCARDIAL IMAGING WITH SPECT (REST AND PHARMACOLOGIC-STRESS)  GATED LEFT VENTRICULAR WALL MOTION STUDY  LEFT VENTRICULAR EJECTION FRACTION  TECHNIQUE: Standard myocardial SPECT imaging was performed after resting intravenous injection of 10 mCi Tc-56m sestamibi. Subsequently, intravenous infusion  of Lexiscan was performed under the supervision of the Cardiology staff. At peak effect of the drug, 30 mCi Tc-78m sestamibi was injected intravenously and standard myocardial SPECT imaging was performed. Quantitative gated imaging was also performed to evaluate left ventricular wall motion, and estimate left ventricular ejection fraction.  COMPARISON:  CT PE study and chest x-ray 07/05/2013  FINDINGS: Evaluation of the cardiac gated data demonstrates an end-diastolic volume of 400 mL and an end systolic volume of 43 mL yielding a calculated ejection fraction of 61%. No evidence of focal or global cardiac wall motion abnormality.  Evaluation of the static resting and post pharmacological stress images demonstrates relatively decreased radiotracer uptake in the mid ventricular and apical anterolateral and inferior walls on the post pharmacological stress images concerning for 2 small areas of inducible ischemia. A fixed defect of a decreased radiotracer uptake in the inferolateral wall is also present. No transient ischemic dilatation or other acute abnormality.  IMPRESSION: 1. Positive for small foci of inducible ischemia in the anterolateral and inferior walls of the mid ventricle and apex. 2. Small fixed defect in the mid ventricular inferolateral wall consistent with a region of remote infarct/scarring. 3. Calculated ejection fraction 61%. 4. No significant cardiac wall motion abnormality.   Electronically Signed   By: Jacqulynn Cadet M.D.   On: 07/06/2013 13:41    Scheduled Meds: . aspirin  81 mg Oral Daily  . atorvastatin  40 mg Oral Daily  . carvedilol  25 mg Oral BID WC  . clopidogrel  75 mg Oral Daily  . hydrochlorothiazide  25 mg Oral Daily  . insulin glargine  60 Units Subcutaneous QHS  . levothyroxine  200 mcg Oral QAC breakfast  . levothyroxine  75 mcg Oral Once per day on Mon Fri  . losartan  50 mg Oral Daily  . morphine  30 mg Oral BID   Continuous Infusions: . sodium chloride 1,000  mL (07/06/13 0745)  . sodium chloride 1 mL/kg/hr (07/07/13 0430)  . heparin 1,650 Units/hr (07/07/13 8676)    Principal Problem:   Chest pain Active Problems:   HYPOTHYROIDISM   DIABETES MELLITUS, TYPE II   HYPERLIPIDEMIA   ANEMIA   HYPERTENSION   GERD   CAROTID ARTERY STENOSIS   Breast cancer metastasized to bone   CAD (coronary artery disease), native coronary artery    Time spent: 30 min    [facility] Pager [phone]. If 7PM-7AM, please contact night-coverage at [email or web address], password [facility] 07/07/2013, 10:21 AM  LOS: 2 days

## 2013-07-08 DIAGNOSIS — I209 Angina pectoris, unspecified: Secondary | ICD-10-CM

## 2013-07-08 DIAGNOSIS — I200 Unstable angina: Secondary | ICD-10-CM

## 2013-07-08 DIAGNOSIS — I10 Essential (primary) hypertension: Secondary | ICD-10-CM

## 2013-07-08 LAB — CBC
HEMATOCRIT: 27.5 % — AB (ref 36.0–46.0)
Hemoglobin: 8.4 g/dL — ABNORMAL LOW (ref 12.0–15.0)
MCH: 27.4 pg (ref 26.0–34.0)
MCHC: 30.5 g/dL (ref 30.0–36.0)
MCV: 89.6 fL (ref 78.0–100.0)
Platelets: 260 10*3/uL (ref 150–400)
RBC: 3.07 MIL/uL — ABNORMAL LOW (ref 3.87–5.11)
RDW: 15.4 % (ref 11.5–15.5)
WBC: 5.7 10*3/uL (ref 4.0–10.5)

## 2013-07-08 LAB — BASIC METABOLIC PANEL
BUN: 18 mg/dL (ref 6–23)
CO2: 19 mEq/L (ref 19–32)
CREATININE: 0.88 mg/dL (ref 0.50–1.10)
Calcium: 9.1 mg/dL (ref 8.4–10.5)
Chloride: 102 mEq/L (ref 96–112)
GFR, EST AFRICAN AMERICAN: 75 mL/min — AB (ref >90)
GFR, EST NON AFRICAN AMERICAN: 65 mL/min — AB (ref >90)
Glucose, Bld: 66 mg/dL — ABNORMAL LOW (ref 70–99)
Potassium: 4.2 mEq/L (ref 3.7–5.3)
Sodium: 137 mEq/L (ref 137–147)

## 2013-07-08 LAB — GLUCOSE, CAPILLARY
GLUCOSE-CAPILLARY: 78 mg/dL (ref 70–99)
Glucose-Capillary: 182 mg/dL — ABNORMAL HIGH (ref 70–99)
Glucose-Capillary: 114 mg/dL — ABNORMAL HIGH (ref 70–99)

## 2013-07-08 MED ORDER — ISOSORBIDE MONONITRATE ER 60 MG PO TB24
60.0000 mg | ORAL_TABLET | Freq: Every day | ORAL | Status: DC
  Administered 2013-07-08: 60 mg via ORAL
  Filled 2013-07-08: qty 1

## 2013-07-08 MED ORDER — CARVEDILOL 25 MG PO TABS: 25.0000 mg | ORAL_TABLET | Freq: Two times a day (BID) | ORAL | Status: DC

## 2013-07-08 MED ORDER — NITROGLYCERIN 0.4 MG SL SUBL: 0.4000 mg | SUBLINGUAL_TABLET | SUBLINGUAL | Status: DC | PRN

## 2013-07-08 MED ORDER — ATORVASTATIN CALCIUM 80 MG PO TABS: 80.0000 mg | ORAL_TABLET | Freq: Every day | ORAL | Status: DC

## 2013-07-08 MED ORDER — ISOSORBIDE MONONITRATE ER 60 MG PO TB24: 60.0000 mg | ORAL_TABLET | Freq: Every day | ORAL | Status: DC

## 2013-07-08 NOTE — Progress Notes (Signed)
Verbalized understanding of discharge instructions.  Pressure dressing applied over IV site after removable.  Dressing to left radius dry and intact, level 0 upon discharge.  Transportation to home provided by husband.

## 2013-07-08 NOTE — Progress Notes (Signed)
       Patient Name: Brittney Cunningham Date of Encounter: 07/08/2013    SUBJECTIVE: No chest discomfort overnight. Looking forward to going home.  TELEMETRY:  Normal sinus rhythm Filed Vitals:   07/07/13 1930 07/07/13 1945 07/07/13 2030 07/08/13 0617  BP: 131/82 101/69 115/93 131/39  Pulse: 80 80 88 66  Temp:   99.1 F (37.3 C) 98.4 F (36.9 C)  TempSrc:   Oral Oral  Resp:   18 18  Height:      Weight:    203 lb 14.4 oz (92.488 kg)  SpO2: 95% 95% 97% 96%   No intake or output data in the 24 hours ending 07/08/13 0945 LABS: Basic Metabolic Panel:  Recent Labs  07/05/13 1600 07/08/13 0519  NA  --  137  K  --  4.2  CL  --  102  CO2  --  19  GLUCOSE  --  66*  BUN  --  18  CREATININE 0.86 0.88  CALCIUM  --  9.1   CBC:  Recent Labs  07/07/13 1340 07/08/13 0519  WBC 6.1 5.7  HGB 9.8* 8.4*  HCT 31.3* 27.5*  MCV 90.7 89.6  PLT 262 260   Cardiac Enzymes:  Recent Labs  07/05/13 1600 07/05/13 2050  TROPONINI <0.30 <0.30   BNP: No components found with this basename: POCBNP,  Hemoglobin A1C:  Recent Labs  07/07/13 0350  HGBA1C 6.5*   Coronary Angiography: 07/07/13 Normal LV function, totally occluded RCA, 80-85% ostial circumflex, widely patent LAD. Circumflex ostial lesion is high risk for PCI and we should manage medically if possible to  Physical Exam: Blood pressure 131/39, pulse 66, temperature 98.4 F (36.9 C), temperature source Oral, resp. rate 18, height 5' 2.5" (1.588 m), weight 203 lb 14.4 oz (92.488 kg), last menstrual period 03/13/2012, SpO2 96.00%. Weight change: 2 lb 14.4 oz (1.315 kg)  Wt Readings from Last 3 Encounters:  07/08/13 203 lb 14.4 oz (92.488 kg)  07/08/13 203 lb 14.4 oz (92.488 kg)  07/03/13 203 lb 0.6 oz (92.098 kg)    Chest is clear S4 gallop No edema. Radial cath site unremarkable  ASSESSMENT:  1. The patient has significant 2 vessel coronary disease with total occlusion of the RCA and 80-85% ostial circumflex  (large vessel). The left main and LAD are widely patent. PCI on the circumflex would require left main stenting were significantly increases the risk of the patient's procedure and also nitroglycerin history with reference to the possibility of stent thrombosis. I agree with Dr. Antionette Char recommendation that medical therapy should be aggressively pursued.  Plan:  Increase isosorbide mononitrate to 60 mg per day. If still having angina with exertion we should increase to 120 mg per day. Ambulate as tolerated. Discharge later today or in a.m. Depending upon control of angina.  Signed, Belva Crome III 07/08/2013, 9:45 AM

## 2013-07-09 ENCOUNTER — Telehealth: Payer: Self-pay | Admitting: Family Medicine

## 2013-07-09 NOTE — Telephone Encounter (Signed)
Can you find a morning just let me know

## 2013-07-09 NOTE — Telephone Encounter (Signed)
Is needing a hospital follow up within 2 weeks, is requesting to only see you. Is this possible for you to work her in?

## 2013-07-09 NOTE — Telephone Encounter (Signed)
Please call and see if pt can come in on 6-12 at 7:15? Please let me know

## 2013-07-09 NOTE — Telephone Encounter (Signed)
Appointment scheduled for 07/11/13 at 7:15am. She could not make the appointment for tomorrow.

## 2013-07-10 ENCOUNTER — Other Ambulatory Visit: Payer: Self-pay | Admitting: *Deleted

## 2013-07-10 DIAGNOSIS — C7951 Secondary malignant neoplasm of bone: Principal | ICD-10-CM

## 2013-07-10 DIAGNOSIS — C50919 Malignant neoplasm of unspecified site of unspecified female breast: Secondary | ICD-10-CM

## 2013-07-10 MED ORDER — MORPHINE SULFATE ER 30 MG PO TBCR: 30.0000 mg | EXTENDED_RELEASE_TABLET | Freq: Two times a day (BID) | ORAL | Status: DC

## 2013-07-11 ENCOUNTER — Telehealth: Payer: Self-pay | Admitting: Family Medicine

## 2013-07-11 ENCOUNTER — Ambulatory Visit (INDEPENDENT_AMBULATORY_CARE_PROVIDER_SITE_OTHER): Payer: Medicare Other | Admitting: Family Medicine

## 2013-07-11 ENCOUNTER — Encounter: Payer: Self-pay | Admitting: Family Medicine

## 2013-07-11 VITALS — BP 158/60 | HR 81 | Temp 99.1°F | Resp 16 | Ht 63.0 in | Wt 203.0 lb

## 2013-07-11 DIAGNOSIS — E785 Hyperlipidemia, unspecified: Secondary | ICD-10-CM

## 2013-07-11 DIAGNOSIS — E119 Type 2 diabetes mellitus without complications: Secondary | ICD-10-CM

## 2013-07-11 DIAGNOSIS — I2510 Atherosclerotic heart disease of native coronary artery without angina pectoris: Secondary | ICD-10-CM

## 2013-07-11 DIAGNOSIS — D649 Anemia, unspecified: Secondary | ICD-10-CM

## 2013-07-11 DIAGNOSIS — I10 Essential (primary) hypertension: Secondary | ICD-10-CM

## 2013-07-11 DIAGNOSIS — I38 Endocarditis, valve unspecified: Secondary | ICD-10-CM

## 2013-07-11 HISTORY — DX: Endocarditis, valve unspecified: I38

## 2013-07-11 LAB — CBC
HEMATOCRIT: 30.1 % — AB (ref 36.0–46.0)
Hemoglobin: 9.5 g/dL — ABNORMAL LOW (ref 12.0–15.0)
MCH: 27.9 pg (ref 26.0–34.0)
MCHC: 31.6 g/dL (ref 30.0–36.0)
MCV: 88.3 fL (ref 78.0–100.0)
Platelets: 292 10*3/uL (ref 150–400)
RBC: 3.41 MIL/uL — AB (ref 3.87–5.11)
RDW: 15.7 % — ABNORMAL HIGH (ref 11.5–15.5)
WBC: 6.3 10*3/uL (ref 4.0–10.5)

## 2013-07-11 MED ORDER — LOSARTAN POTASSIUM 50 MG PO TABS: 50.0000 mg | ORAL_TABLET | Freq: Two times a day (BID) | ORAL | Status: DC

## 2013-07-11 MED ORDER — HYDROCHLOROTHIAZIDE 12.5 MG PO CAPS: 12.5000 mg | ORAL_CAPSULE | Freq: Every day | ORAL | Status: DC

## 2013-07-11 NOTE — Telephone Encounter (Signed)
Refill-hydrochlorothiazide   [facility]

## 2013-07-11 NOTE — Progress Notes (Signed)
Pre visit review using our clinic review tool, if applicable. No additional management support is needed unless otherwise documented below in the visit note. 

## 2013-07-11 NOTE — Progress Notes (Signed)
Patient ID: Brittney Cunningham, female   DOB: April 07, 1940, 73 y.o.   MRN: [id number] Brittney Cunningham [id number] 1941-01-13 07/11/2013      Progress Note-Follow Up  Subjective  Chief Complaint  Chief Complaint  Patient presents with  . Chest Pain    Pt here for hospital follow up of chest pain. Has 2 blockages. Sees cardiologist 08/05/13.    HPI  Patient is a 73 year old female in today for routine medical care. Patient is here today for hospitalfollowup.  She had a anginal event upon awakening the morning of June 8. She presented to the hospital and underwent echo and cardiac cath. Echo showed moderate mitral valve regurg atrial septum thickening but a normal EF. She also has aortic sclerosis and moderate thickening of the aortic valve and tricuspid regurg as well. She notes some increased anxiety since returning home but she says no chest pressure or palpitations it occurred. She is tolerating her new meds except that she does get a headache midday after taking her into her. She does note deconditioning, fatigue and some shortness of breath with exertion. Blood sugars have been well-controlled and she denies GI or GU concerns at this time. No myalgias with the increased dose of atorvastatin.  Past Medical History  Diagnosis Date  . Anemia   . Depression   . Diabetes mellitus type II   . GERD (gastroesophageal reflux disease)   . Hyperlipidemia   . Hypothyroidism   . Osteoarthritis   . Carotid artery stenosis     bilateral  . CAD (coronary artery disease)   . OSA (obstructive sleep apnea)   . Breast cancer     right breast cancer-invasive  ductal carcinoma (StageIV)  . Hypertension   . Anxiety   . Carotid artery occlusion   . Unspecified constipation 03/15/2012  . Bursitis   . Collagen vascular disease   . History of radiation therapy 10/31/12-11/13/12    rt&lt humerous 30Gy/16fx    Past Surgical History  Procedure Laterality Date  . Incisional breast biopsy      remoted left  breast biopsy  . Cesarean section    . Other surgical history      GYN surgery  . Knee surgery      right knee surgery  . Carotid endarterectomy      right carotid  . Port-a-cath removal    . Modified mastectomy      Right breast  . Breast surgery    . Mastectomy    . Carotid stent      left    Family History  Problem Relation Age of Onset  . Arthritis    . Coronary artery disease      first degree relative  . Stroke      first degree relative  . Diabetes Mother   . Heart disease Mother   . Hypertension Mother   . Cancer Mother   . Deep vein thrombosis Mother   . Colon cancer Neg Hx   . Stomach cancer Neg Hx   . Cancer Father     Pancreatic cancer  . Breast cancer Paternal Aunt 48    History   Social History  . Marital Status: Married    Spouse Name: N/A    Number of Children: 3  . Years of Education: N/A   Occupational History  . 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 Smoking status: Former Smoker -- 1.00  packs/day for 20 years    Types: Cigarettes    Quit date: 01/31/1988  . Smokeless tobacco: Never Used  . Alcohol Use: No  . Drug Use: No  . Sexual Activity: Not Currently    Birth Control/ Protection: Post-menopausal   Other Topics Concern  . Not on file   Social History Narrative   Retired-ran a daycare facility for 40 years.   Married- 81 years    2 sons    1 daughter - Marine scientist midwife   Former smoker-quit in [place].     Current Outpatient Prescriptions on File Prior to Visit  Medication Sig Dispense Refill  . aspirin 81 MG chewable tablet Chew 81 mg by mouth daily.      Marland Kitchen atorvastatin (LIPITOR) 80 MG tablet Take 1 tablet (80 mg total) by mouth daily.  30 tablet  0  . Blood Glucose Monitoring Suppl (ACCU-CHEK NANO SMARTVIEW) W/DEVICE KIT Use to check blood sugar twice a day. DX 250.00  1 kit  0  . carvedilol (COREG) 25 MG tablet Take 1 tablet (25 mg total) by mouth 2 (two) times daily with a meal.  60 tablet  0  .  clopidogrel (PLAVIX) 75 MG tablet Take 1 tablet (75 mg total) by mouth daily.  30 tablet  8  . cyanocobalamin (,VITAMIN B-12,) 1000 MCG/ML injection Inject 1,000 mcg into the muscle every 30 (thirty) days. Last dose 03/09/2011      . darbepoetin (ARANESP) 200 MCG/0.4ML SOLN Inject 200 mcg into the skin once. Every 30 days      . glucose blood (ACCU-CHEK AVIVA) test strip Use as instructed to check blood sugar twice a day  Dx 250.00  100 each  3  . glycerin adult (GLYCERIN ADULT) 2 G SUPP Place 1 suppository rectally once as needed (constipation).  10 suppository  0  . hydrochlorothiazide (MICROZIDE) 12.5 MG capsule Take 1 capsule (12.5 mg total) by mouth daily.  30 capsule  0  . insulin glargine (LANTUS) 100 UNIT/ML injection Inject 0.6 mLs (60 Units total) into the skin at bedtime. Pt wants vials.  30 mL  1  . isosorbide mononitrate (IMDUR) 60 MG 24 hr tablet Take 1 tablet (60 mg total) by mouth daily.  30 tablet  0  . levothyroxine (SYNTHROID, LEVOTHROID) 200 MCG tablet Take 1 tablet (200 mcg total) by mouth daily before breakfast.  30 tablet  3  . levothyroxine (SYNTHROID, LEVOTHROID) 75 MCG tablet 33mcg MOn  & Fridays with the 228mcg daily      . metFORMIN (GLUCOPHAGE) 500 MG tablet Take 1 tablet (500 mg total) by mouth every morning.  30 tablet  5  . morphine (MS CONTIN) 30 MG 12 hr tablet Take 1 tablet (30 mg total) by mouth 2 (two) times daily.  60 tablet  0  . nitroGLYCERIN (NITROSTAT) 0.4 MG SL tablet Place 1 tablet (0.4 mg total) under the tongue every 5 (five) minutes as needed for chest pain.  30 tablet  0  . polyethylene glycol (MIRALAX / GLYCOLAX) packet Take 17 g by mouth daily as needed.      . [DISCONTINUED] simvastatin (ZOCOR) 40 MG tablet Take 40 mg by mouth every evening.       No current facility-administered medications on file prior to visit.    Allergies  Allergen Reactions  . Chlorhexidine Hives, Itching and Rash    This was most likely a CONTACT DERMATITIS versus true  systemic allergic reaction  . Adhesive [Tape] Hives  .  Codeine Swelling    Review of Systems  Review of Systems  Constitutional: Negative for fever and malaise/fatigue.  HENT: Negative for congestion.   Eyes: Negative for discharge.  Respiratory: Negative for shortness of breath.   Cardiovascular: Negative for chest pain, palpitations and leg swelling.  Gastrointestinal: Negative for nausea, abdominal pain and diarrhea.  Genitourinary: Negative for dysuria.  Musculoskeletal: Negative for falls.  Skin: Negative for rash.  Neurological: Negative for loss of consciousness and headaches.  Endo/Heme/Allergies: Negative for polydipsia.  Psychiatric/Behavioral: Negative for depression and suicidal ideas. The patient is not nervous/anxious and does not have insomnia.     Objective  BP 158/60  Pulse 81  Temp(Src) 99.1 F (37.3 C) (Oral)  Resp 16  Ht $R'5\' 3"'hY$  (1.6 m)  Wt 203 lb (92.08 kg)  BMI 35.97 kg/m2  SpO2 95%  LMP 03/13/2012  Physical Exam  Physical Exam  Constitutional: She is oriented to person, place, and time and well-developed, well-nourished, and in no distress. No distress.  HENT:  Head: Normocephalic and atraumatic.  Eyes: Conjunctivae are normal.  Neck: Neck supple. No thyromegaly present.  Cardiovascular: Normal rate, regular rhythm and intact distal pulses.  Exam reveals no gallop.   Murmur heard. Pulmonary/Chest: Effort normal and breath sounds normal. She has no wheezes.  Abdominal: She exhibits no distension and no mass.  Musculoskeletal: She exhibits no edema.  Lymphadenopathy:    She has no cervical adenopathy.  Neurological: She is alert and oriented to person, place, and time.  Skin: Skin is warm and dry. No rash noted. She is not diaphoretic.  Psychiatric: Memory, affect and judgment normal.    Lab Results  Component Value Date   TSH 0.045* 07/03/2013   Lab Results  Component Value Date   WBC 5.7 07/08/2013   HGB 8.4* 07/08/2013   HCT 27.5*  07/08/2013   MCV 89.6 07/08/2013   PLT 260 07/08/2013   Lab Results  Component Value Date   CREATININE 0.88 07/08/2013   BUN 18 07/08/2013   NA 137 07/08/2013   K 4.2 07/08/2013   CL 102 07/08/2013   CO2 19 07/08/2013   Lab Results  Component Value Date   ALT 8 07/05/2013   AST 14 07/05/2013   ALKPHOS 125* 07/05/2013   BILITOT 0.3 07/05/2013   Lab Results  Component Value Date   CHOL 130 07/03/2013   Lab Results  Component Value Date   HDL 22* 07/03/2013   Lab Results  Component Value Date   LDLCALC 80 07/03/2013   Lab Results  Component Value Date   TRIG 142 07/03/2013   Lab Results  Component Value Date   CHOLHDL 5.9 07/03/2013     Assessment & Plan  CAD (coronary artery disease), native coronary artery Just released from the hospital with a bad anginal event. Cardiac cath on 6/8 showed  1. Total occlusion of the RCA, chronic  2. Patency of the left main and LAD  3. Moderately severe ostial left circumflex stenosis  No further CP or palpitations just fatigue and easily SOB. Encouraged only activity as tolerated for now and follow up in 2 weeks or as needed   DIABETES MELLITUS, TYPE II Patient reports good numbers at home. hgba1c acceptable, minimize simple carbs. Increase exercise as tolerated. Continue current meds  HYPERTENSION Elevated today. Will check renal panel and increase Losartan to 50 mg po bid continue other meds and including increased dose of Carvedilol. Is having a slight HA after taking Imdur but understands the  importance of them med so will keep taking it.  HYPERLIPIDEMIA Is tolerating the increased dose of Atorvastatin. No changes  ANEMIA Repeat CBC today  Valvular heart disease Echo June 2015   - Left ventricle: The cavity size was normal. Systolic function was normal. The estimated ejection fraction was in the range of 55% to 60%. Wall motion was normal; there were no regional wall motion abnormalities. There was an increased relative contribution of  atrial contraction to ventricular filling. Doppler parameters are consistent with abnormal left ventricular relaxation (grade 1 diastolic dysfunction). - Aortic valve: Moderate diffuse thickening and calcification, consistent with sclerosis. - Mitral valve: Calcified annulus. There was moderate regurgitation. - Right atrium: The atrium was mildly dilated. - Atrial septum: There was increased thickness of the septum, consistent with lipomatous hypertrophy. - Tricuspid valve: There was mild regurgitation.

## 2013-07-11 NOTE — Assessment & Plan Note (Signed)
Echo June 2015   - Left ventricle: The cavity size was normal. Systolic function was normal. The estimated ejection fraction was in the range of 55% to 60%. Wall motion was normal; there were no regional wall motion abnormalities. There was an increased relative contribution of atrial contraction to ventricular filling. Doppler parameters are consistent with abnormal left ventricular relaxation (grade 1 diastolic dysfunction). - Aortic valve: Moderate diffuse thickening and calcification, consistent with sclerosis. - Mitral valve: Calcified annulus. There was moderate regurgitation. - Right atrium: The atrium was mildly dilated. - Atrial septum: There was increased thickness of the septum, consistent with lipomatous hypertrophy. - Tricuspid valve: There was mild regurgitation.

## 2013-07-11 NOTE — Telephone Encounter (Signed)
Rx sent into [facility].

## 2013-07-11 NOTE — Assessment & Plan Note (Signed)
Repeat CBC today 

## 2013-07-11 NOTE — Assessment & Plan Note (Signed)
Elevated today. Will check renal panel and increase Losartan to 50 mg po bid continue other meds and including increased dose of Carvedilol. Is having a slight HA after taking Imdur but understands the importance of them med so will keep taking it.

## 2013-07-11 NOTE — Patient Instructions (Signed)
Myocardial Infarction  A myocardial infarction (MI) is damage to the heart that is not reversible. It is also called a heart attack. An MI usually occurs when a heart (coronary) artery becomes blocked or narrowed. This cuts off the blood supply to the heart. When one or more of the heart (coronary) arteries becomes blocked, that area of the heart begins to die. This causes pain felt during an MI.   If you think you might be having an MI, call your local emergency services immediately (911 in [place] It is recommended that you take a 162 mg non-enteric coated aspirin if you do not have an aspirin allergy. Do not drive yourself to the hospital or wait to see if your symptoms go away. The sooner MI is treated, the greater the amount of heart muscle saved. Time is muscle. It can save your life.  CAUSES   An MI can occur from:  · A gradual buildup of a fatty substance called plaque. When plaque builds up in the arteries, this condition is called atherosclerosis. This buildup can block or reduce the blood supply to the heart artery(s).  · A sudden plaque rupture within a heart artery that causes a blood clot (thrombus). A blood clot can block the heart artery which does not allow blood flow to the heart.  · A severe tightening (spasm) of the heart artery. This is a less common cause of a heart attack. When a heart artery spasms, it cuts off blood flow through the artery. Spasms can occur in heart arteries that do not have atherosclerosis.  RISK FACTORS  People at risk for an MI usually have one or more risk factors, such as:  · High blood pressure.  · High cholesterol.  · Smoking.  · Gender. Men have a higher heart attack risk.  · Overweight/obesity.  · Age.  · Family history.  · Lack of exercise.  · Diabetes.  · Stress.  · Excessive alcohol use.  · Street drug use (cocaine and methamphetamines).  SYMPTOMS   MI symptoms can vary, such as:  · In both men and women, MI symptoms can include the following:  · Chest pain. The  chest pain may feel like a crushing, squeezing, or "pressure" type feeling. MI pain can be "referred," meaning pain can be caused in one part of the body but felt in another part of the body. Referred MI pain may occur in the left arm, neck, or jaw. Pain may even be felt in the right arm.  · Shortness of breath (dyspnea).  · Heartburn or indigestion with or without vomiting, shortness of breath, or sweating (diaphoresis).  · Sudden, cold sweats.  · Sudden lightheadedness.  · Upper back pain.  · Women can have unique MI symptoms, such as:  · Unexplained feelings of nervousness or anxiety.  · Discomfort between the shoulder blades (scapula) or upper back.  · Tingling in the hands and arms.  · In elderly people (regardless of gender), MI symptoms can be subtle, such as:  · Sweating (diaphoresis).  · Shortness of breath (dyspnea).  · General tiredness (fatigue) or not feeling well (malaise).  DIAGNOSIS   Diagnosis of an MI involves several tests such as:  · An assessment of your vital signs such as heart rhythm, blood pressure, respiratory rate, and oxygen level.  · An EKG (ECG) to look at the electrical activity of your heart.  · Blood tests called cardiac markers are drawn at scheduled times to measure proteins or   enzymes released by the damaged heart muscle.  · A chest X-ray.  · An echocardiogram to evaluate heart motion and blood flow.  · Coronary angiography (cardiac catheterization). This is a diagnostic procedure to look at the heart arteries.  TREATMENT   Acute Intervention. For an MI, the national standard in the [place] is to have an acute intervention in under 90 minutes from the time you get to the hospital. An acute intervention is a special procedure to open up the heart arteries. It is done in a treatment room called a "catheterization lab" (cath lab). Some hospitals do no have a cath lab. If you are having an MI and the hospital does not have a cath lab, the standard is to transport you to a  hospital that has one. In the cath lab, acute intervention includes:  · Angioplasty. An angioplasty involves inserting a thin, flexible tube (catheter) into an artery in either your groin or wrist. The catheter is threaded to the heart arteries. A balloon at the end of the catheter is inflated to open a narrowed or blocked heart artery. During an angioplasty procedure, a small mesh tube (stent) may be used to keep the heart artery open. Depending on your condition and health history, one of two types of stents may be placed:  · Drug-eluting stent (DES). A DES is coated with a medicine to prevent scar tissue from growing over the stent. With drug-eluting stents, blood thinning medicine will need to be taken for up to a year.  · Bare metal stent. This type of stent has no special coating to keep tissue from growing over it. This type of stent is used if you cannot take blood thinning medicine for a prolonged time or you need surgery in the near future. After a bare metal stent is placed, blood thinning medicine will need to be taken for about a month.  · If you are taking blood thinning medicine (anti-platelet therapy) after stent placement, do not stop taking it unless your caregiver says it is okay to do so. Make sure you understand how long you need to take the medicine.  Surgical Intervention  · If an acute intervention is not successful, surgery may be needed:  · Open heart surgery (coronary artery bypass graft, CABG). CABG takes a vein (saphenous vein) from your leg. The vein is then attached to the blocked heart artery which bypasses the blockage. This then allows blood flow to the heart muscle.  Additional Interventions  · A "clot buster" medicine (thrombolytic) may be given. This medicine can help break up a clot in the heart artery. This medicine may be given if a person cannot get to a cath lab right away.  · Intra-aortic balloon pump (IABP). If you have suffered a very severe MI and are too unstable to go  to the cath lab or to surgery, an IABP may be used. This is a temporary mechanical device used to increase blood flow to the heart and reduce the workload of the heart until you are stable enough to go to the cath lab or surgery.  HOME CARE INSTRUCTIONS  After an MI, you may need the following:  · Medicine. Take medicine as directed by your caregiver. Medicines after an MI may:  · Keep your blood from clotting easily (blood thinners).  · Control your blood pressure.  · Help lower your cholesterol.  · Control abnormal heart rhythms.  · Lifestyle changes. Under the guidance of your caregiver, lifestyle changes   include:  · Quitting smoking, if you smoke. Your caregiver can help you quit.  · Being physically active.  · Maintaining a healthy weight.  · Eating a heart healthy diet. A dietitian can help you learn healthy eating options.  · Managing diabetes.  · Reducing stress.  · Limiting alcohol intake.  SEEK IMMEDIATE MEDICAL CARE IF:   · You have severe chest pain, especially if the pain is crushing or pressure-like and spreads to the arms, back, neck, or jaw. This is an emergency. Do not wait to see if the pain will go away. Get medical help at once. Call your local emergency services (911 in the [place] Do not drive yourself to the hospital.  · You have shortness of breath during rest, sleep, or with activity.  · You have sudden sweating or clammy skin.  · You feel sick to your stomach (nauseous) and throw up (vomit).  · You suddenly become lightheaded or dizzy.  · You feel your heart beating rapidly or you notice "skipped" beats.  MAKE SURE YOU:   · Understand these instructions.  · Will watch your condition.  · Will get help right away if you are not doing well or get worse.  Document Released: 01/16/2005 Document Revised: 10/11/2011 Document Reviewed: 06/21/2010  [employer]® Patient Information ©2014 [employer].

## 2013-07-11 NOTE — Assessment & Plan Note (Signed)
Patient reports good numbers at home. hgba1c acceptable, minimize simple carbs. Increase exercise as tolerated. Continue current meds

## 2013-07-11 NOTE — Assessment & Plan Note (Addendum)
Just released from the hospital with a bad anginal event. Cardiac cath on 6/8 showed  1. Total occlusion of the RCA, chronic  2. Patency of the left main and LAD  3. Moderately severe ostial left circumflex stenosis  No further CP or palpitations just fatigue and easily SOB. Encouraged only activity as tolerated for now and follow up in 2 weeks or as needed

## 2013-07-11 NOTE — Assessment & Plan Note (Signed)
Is tolerating the increased dose of Atorvastatin. No changes

## 2013-07-12 LAB — RENAL FUNCTION PANEL
ALBUMIN: 3.5 g/dL (ref 3.5–5.2)
BUN: 23 mg/dL (ref 6–23)
CHLORIDE: 106 meq/L (ref 96–112)
CO2: 23 meq/L (ref 19–32)
Calcium: 9 mg/dL (ref 8.4–10.5)
Creat: 0.91 mg/dL (ref 0.50–1.10)
GLUCOSE: 102 mg/dL — AB (ref 70–99)
Phosphorus: 4.2 mg/dL (ref 2.3–4.6)
Potassium: 5.3 mEq/L (ref 3.5–5.3)
Sodium: 139 mEq/L (ref 135–145)

## 2013-07-13 ENCOUNTER — Other Ambulatory Visit: Payer: Self-pay | Admitting: Family Medicine

## 2013-07-13 DIAGNOSIS — R0600 Dyspnea, unspecified: Secondary | ICD-10-CM

## 2013-07-13 DIAGNOSIS — R5381 Other malaise: Secondary | ICD-10-CM

## 2013-07-13 DIAGNOSIS — I2510 Atherosclerotic heart disease of native coronary artery without angina pectoris: Secondary | ICD-10-CM

## 2013-07-14 MED ORDER — FERROUS FUMARATE 325 (106 FE) MG PO TABS: 1.0000 | ORAL_TABLET | Freq: Every day | ORAL | Status: DC

## 2013-07-14 NOTE — Addendum Note (Signed)
Addended by: Jannette Spanner on: 07/14/2013 03:08 PM   Modules accepted: Orders

## 2013-07-25 ENCOUNTER — Encounter: Payer: Self-pay | Admitting: Family Medicine

## 2013-07-25 ENCOUNTER — Ambulatory Visit (INDEPENDENT_AMBULATORY_CARE_PROVIDER_SITE_OTHER): Payer: Medicare Other | Admitting: Family Medicine

## 2013-07-25 ENCOUNTER — Telehealth: Payer: Self-pay | Admitting: Family Medicine

## 2013-07-25 VITALS — BP 130/54 | HR 70 | Temp 99.2°F | Ht 63.0 in | Wt 203.0 lb

## 2013-07-25 DIAGNOSIS — E119 Type 2 diabetes mellitus without complications: Secondary | ICD-10-CM

## 2013-07-25 DIAGNOSIS — I10 Essential (primary) hypertension: Secondary | ICD-10-CM

## 2013-07-25 DIAGNOSIS — R2681 Unsteadiness on feet: Secondary | ICD-10-CM

## 2013-07-25 DIAGNOSIS — E039 Hypothyroidism, unspecified: Secondary | ICD-10-CM

## 2013-07-25 DIAGNOSIS — I2510 Atherosclerotic heart disease of native coronary artery without angina pectoris: Secondary | ICD-10-CM

## 2013-07-25 DIAGNOSIS — R269 Unspecified abnormalities of gait and mobility: Secondary | ICD-10-CM

## 2013-07-25 NOTE — Telephone Encounter (Signed)
Yes but only short distances, near [place], no interstate

## 2013-07-25 NOTE — Progress Notes (Signed)
Pre visit review using our clinic review tool, if applicable. No additional management support is needed unless otherwise documented below in the visit note. 

## 2013-07-25 NOTE — Telephone Encounter (Signed)
Please advise 

## 2013-07-25 NOTE — Patient Instructions (Signed)

## 2013-07-25 NOTE — Progress Notes (Signed)
Patient ID: Brittney Cunningham, female   DOB: 11-Oct-1940, 73 y.o.   MRN: [id number] Brittney Cunningham [id number] 10-07-1940 07/25/2013      Progress Note-Follow Up  Subjective  Chief Complaint  Chief Complaint  Patient presents with  . Follow-up    2 week    HPI  Patient is a 73 year old female in today for routine medical care. She is in today for followup. She reports her strength is improving. She's feeling better and better. She denies chest pain or palpitations. She has less episodes of feeling woozy upon arising. No presyncope. Denies chest pain, palpitations or shortness of breath appetite has returned.  Past Medical History  Diagnosis Date  . Anemia   . Depression   . Diabetes mellitus type II   . GERD (gastroesophageal reflux disease)   . Hyperlipidemia   . Hypothyroidism   . Osteoarthritis   . Carotid artery stenosis     bilateral  . CAD (coronary artery disease)   . OSA (obstructive sleep apnea)   . Breast cancer     right breast cancer-invasive  ductal carcinoma (StageIV)  . Hypertension   . Anxiety   . Carotid artery occlusion   . Unspecified constipation 03/15/2012  . Bursitis   . Collagen vascular disease   . History of radiation therapy 10/31/12-11/13/12    rt&lt humerous 30Gy/79fx  . Valvular heart disease 07/11/2013    Past Surgical History  Procedure Laterality Date  . Incisional breast biopsy      remoted left breast biopsy  . Cesarean section    . Other surgical history      GYN surgery  . Knee surgery      right knee surgery  . Carotid endarterectomy      right carotid  . Port-a-cath removal    . Modified mastectomy      Right breast  . Breast surgery    . Mastectomy    . Carotid stent      left    Family History  Problem Relation Age of Onset  . Arthritis    . Coronary artery disease      first degree relative  . Stroke      first degree relative  . Diabetes Mother   . Heart disease Mother   . Hypertension Mother   . Cancer Mother    . Deep vein thrombosis Mother   . Colon cancer Neg Hx   . Stomach cancer Neg Hx   . Cancer Father     Pancreatic cancer  . Breast cancer Paternal Aunt 77    History   Social History  . Marital Status: Married    Spouse Name: N/A    Number of Children: 3  . Years of Education: N/A   Occupational History  . 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 Smoking status: Former Smoker -- 1.00 packs/day for 20 years    Types: Cigarettes    Quit date: 01/31/1988  . Smokeless tobacco: Never Used  . Alcohol Use: No  . Drug Use: No  . Sexual Activity: Not Currently    Birth Control/ Protection: Post-menopausal   Other Topics Concern  . Not on file   Social History Narrative   Retired-ran a daycare facility for 40 years.   Married- 61 years    2 sons    1 daughter - Marine scientist midwife   Former smoker-quit in [place].  Current Outpatient Prescriptions on File Prior to Visit  Medication Sig Dispense Refill  . aspirin 81 MG chewable tablet Chew 81 mg by mouth daily.      Marland Kitchen atorvastatin (LIPITOR) 80 MG tablet Take 1 tablet (80 mg total) by mouth daily.  30 tablet  0  . Blood Glucose Monitoring Suppl (ACCU-CHEK NANO SMARTVIEW) W/DEVICE KIT Use to check blood sugar twice a day. DX 250.00  1 kit  0  . carvedilol (COREG) 25 MG tablet Take 1 tablet (25 mg total) by mouth 2 (two) times daily with a meal.  60 tablet  0  . clopidogrel (PLAVIX) 75 MG tablet Take 1 tablet (75 mg total) by mouth daily.  30 tablet  8  . cyanocobalamin (,VITAMIN B-12,) 1000 MCG/ML injection Inject 1,000 mcg into the muscle every 30 (thirty) days. Last dose 03/09/2011      . darbepoetin (ARANESP) 200 MCG/0.4ML SOLN Inject 200 mcg into the skin once. Every 30 days      . ferrous fumarate (HEMOCYTE - 106 MG FE) 325 (106 FE) MG TABS tablet Take 1 tablet (106 mg of iron total) by mouth daily.  30 each  3  . glucose blood (ACCU-CHEK AVIVA) test strip Use as instructed to check blood sugar twice a day   Dx 250.00  100 each  3  . glycerin adult (GLYCERIN ADULT) 2 G SUPP Place 1 suppository rectally once as needed (constipation).  10 suppository  0  . hydrochlorothiazide (MICROZIDE) 12.5 MG capsule Take 1 capsule (12.5 mg total) by mouth daily.  30 capsule  3  . insulin glargine (LANTUS) 100 UNIT/ML injection Inject 0.6 mLs (60 Units total) into the skin at bedtime. Pt wants vials.  30 mL  1  . isosorbide mononitrate (IMDUR) 60 MG 24 hr tablet Take 1 tablet (60 mg total) by mouth daily.  30 tablet  0  . levothyroxine (SYNTHROID, LEVOTHROID) 200 MCG tablet Take 1 tablet (200 mcg total) by mouth daily before breakfast.  30 tablet  3  . levothyroxine (SYNTHROID, LEVOTHROID) 75 MCG tablet 59mcg MOn  & Fridays with the 225mcg daily      . losartan (COZAAR) 50 MG tablet Take 1 tablet (50 mg total) by mouth 2 (two) times daily.  60 tablet  1  . metFORMIN (GLUCOPHAGE) 500 MG tablet Take 1 tablet (500 mg total) by mouth every morning.  30 tablet  5  . morphine (MS CONTIN) 30 MG 12 hr tablet Take 1 tablet (30 mg total) by mouth 2 (two) times daily.  60 tablet  0  . nitroGLYCERIN (NITROSTAT) 0.4 MG SL tablet Place 1 tablet (0.4 mg total) under the tongue every 5 (five) minutes as needed for chest pain.  30 tablet  0  . polyethylene glycol (MIRALAX / GLYCOLAX) packet Take 17 g by mouth daily as needed.      . [DISCONTINUED] simvastatin (ZOCOR) 40 MG tablet Take 40 mg by mouth every evening.       No current facility-administered medications on file prior to visit.    Allergies  Allergen Reactions  . Chlorhexidine Hives, Itching and Rash    This was most likely a CONTACT DERMATITIS versus true systemic allergic reaction  . Adhesive [Tape] Hives  . Codeine Swelling    Review of Systems  Review of Systems  Constitutional: Negative for fever and malaise/fatigue.  HENT: Negative for congestion.   Eyes: Negative for discharge.  Respiratory: Negative for shortness of breath.   Cardiovascular: Negative  for chest pain, palpitations and leg swelling.  Gastrointestinal: Negative for nausea, abdominal pain and diarrhea.  Genitourinary: Negative for dysuria.  Musculoskeletal: Negative for falls.  Skin: Negative for rash.  Neurological: Negative for loss of consciousness and headaches.  Endo/Heme/Allergies: Negative for polydipsia.  Psychiatric/Behavioral: Negative for depression and suicidal ideas. The patient is not nervous/anxious and does not have insomnia.     Objective  BP 130/54  Pulse 70  Temp(Src) 99.2 F (37.3 C) (Oral)  Ht $R'5\' 3"'xy$  (1.6 m)  Wt 203 lb (92.08 kg)  BMI 35.97 kg/m2  SpO2 95%  LMP 03/13/2012  Physical Exam  Physical Exam  Constitutional: She is oriented to person, place, and time and well-developed, well-nourished, and in no distress. No distress.  HENT:  Head: Normocephalic and atraumatic.  Eyes: Conjunctivae are normal.  Neck: Neck supple. No thyromegaly present.  Cardiovascular: Normal rate, regular rhythm and normal heart sounds.   No murmur heard. Pulmonary/Chest: Effort normal and breath sounds normal. She has no wheezes.  Abdominal: She exhibits no distension and no mass.  Musculoskeletal: She exhibits no edema.  Lymphadenopathy:    She has no cervical adenopathy.  Neurological: She is alert and oriented to person, place, and time.  Skin: Skin is warm and dry. No rash noted. She is not diaphoretic.  Psychiatric: Memory, affect and judgment normal.    Lab Results  Component Value Date   TSH 0.045* 07/03/2013   Lab Results  Component Value Date   WBC 6.3 07/11/2013   HGB 9.5* 07/11/2013   HCT 30.1* 07/11/2013   MCV 88.3 07/11/2013   PLT 292 07/11/2013   Lab Results  Component Value Date   CREATININE 0.91 07/11/2013   BUN 23 07/11/2013   NA 139 07/11/2013   K 5.3 07/11/2013   CL 106 07/11/2013   CO2 23 07/11/2013   Lab Results  Component Value Date   ALT 8 07/05/2013   AST 14 07/05/2013   ALKPHOS 125* 07/05/2013   BILITOT 0.3 07/05/2013   Lab  Results  Component Value Date   CHOL 130 07/03/2013   Lab Results  Component Value Date   HDL 22* 07/03/2013   Lab Results  Component Value Date   LDLCALC 80 07/03/2013   Lab Results  Component Value Date   TRIG 142 07/03/2013   Lab Results  Component Value Date   CHOLHDL 5.9 07/03/2013     Assessment & Plan   HYPERTENSION Well controlled, no changes to meds. Encouraged heart healthy diet such as the DASH diet and exercise as tolerated.   DIABETES MELLITUS, TYPE II hgba1c acceptable, minimize simple carbs. Increase exercise as tolerated. Continue current meds  HYPOTHYROIDISM On Levothyroxine, continue to monitor  CAD (coronary artery disease), native coronary artery Doing better but is still awaiting appt for cardiopulmonary rehab and is trying to increase  activity  Unsteady gait Is ready for rehab

## 2013-07-25 NOTE — Telephone Encounter (Signed)
Please inform patient

## 2013-07-25 NOTE — Telephone Encounter (Signed)
Patient forgot to ask Dr B if she can drive now  Please advise

## 2013-07-27 NOTE — Assessment & Plan Note (Signed)
Doing better but is still awaiting appt for cardiopulmonary rehab and is trying to increase  activity

## 2013-07-27 NOTE — Assessment & Plan Note (Signed)
On Levothyroxine, continue to monitor 

## 2013-07-27 NOTE — Assessment & Plan Note (Signed)
Is ready for rehab

## 2013-07-27 NOTE — Assessment & Plan Note (Signed)
Well controlled, no changes to meds. Encouraged heart healthy diet such as the DASH diet and exercise as tolerated.  °

## 2013-07-27 NOTE — Assessment & Plan Note (Signed)
hgba1c acceptable, minimize simple carbs. Increase exercise as tolerated. Continue current meds 

## 2013-07-28 ENCOUNTER — Other Ambulatory Visit: Payer: Self-pay | Admitting: *Deleted

## 2013-07-28 ENCOUNTER — Ambulatory Visit (HOSPITAL_BASED_OUTPATIENT_CLINIC_OR_DEPARTMENT_OTHER): Payer: Medicare Other

## 2013-07-28 ENCOUNTER — Other Ambulatory Visit (HOSPITAL_BASED_OUTPATIENT_CLINIC_OR_DEPARTMENT_OTHER): Payer: Medicare Other

## 2013-07-28 VITALS — BP 164/42 | HR 72 | Temp 98.5°F

## 2013-07-28 DIAGNOSIS — C7951 Secondary malignant neoplasm of bone: Principal | ICD-10-CM

## 2013-07-28 DIAGNOSIS — C50911 Malignant neoplasm of unspecified site of right female breast: Secondary | ICD-10-CM

## 2013-07-28 DIAGNOSIS — C7952 Secondary malignant neoplasm of bone marrow: Secondary | ICD-10-CM

## 2013-07-28 DIAGNOSIS — Z5111 Encounter for antineoplastic chemotherapy: Secondary | ICD-10-CM

## 2013-07-28 DIAGNOSIS — C50919 Malignant neoplasm of unspecified site of unspecified female breast: Secondary | ICD-10-CM

## 2013-07-28 DIAGNOSIS — D631 Anemia in chronic kidney disease: Secondary | ICD-10-CM

## 2013-07-28 DIAGNOSIS — N189 Chronic kidney disease, unspecified: Principal | ICD-10-CM

## 2013-07-28 DIAGNOSIS — N289 Disorder of kidney and ureter, unspecified: Secondary | ICD-10-CM

## 2013-07-28 DIAGNOSIS — D649 Anemia, unspecified: Secondary | ICD-10-CM

## 2013-07-28 DIAGNOSIS — E538 Deficiency of other specified B group vitamins: Secondary | ICD-10-CM

## 2013-07-28 DIAGNOSIS — C50619 Malignant neoplasm of axillary tail of unspecified female breast: Secondary | ICD-10-CM

## 2013-07-28 LAB — COMPREHENSIVE METABOLIC PANEL (CC13)
ALK PHOS: 107 U/L (ref 40–150)
ALT: 9 U/L (ref 0–55)
ANION GAP: 11 meq/L (ref 3–11)
AST: 19 U/L (ref 5–34)
Albumin: 3 g/dL — ABNORMAL LOW (ref 3.5–5.0)
BILIRUBIN TOTAL: 0.4 mg/dL (ref 0.20–1.20)
BUN: 29.5 mg/dL — ABNORMAL HIGH (ref 7.0–26.0)
CO2: 22 mEq/L (ref 22–29)
CREATININE: 1.3 mg/dL — AB (ref 0.6–1.1)
Calcium: 9.3 mg/dL (ref 8.4–10.4)
Chloride: 104 mEq/L (ref 98–109)
Glucose: 125 mg/dl (ref 70–140)
Potassium: 5 mEq/L (ref 3.5–5.1)
Sodium: 137 mEq/L (ref 136–145)
Total Protein: 6.6 g/dL (ref 6.4–8.3)

## 2013-07-28 LAB — CBC WITH DIFFERENTIAL/PLATELET
BASO%: 0.7 % (ref 0.0–2.0)
Basophils Absolute: 0 10*3/uL (ref 0.0–0.1)
EOS%: 6.1 % (ref 0.0–7.0)
Eosinophils Absolute: 0.3 10*3/uL (ref 0.0–0.5)
HEMATOCRIT: 28.9 % — AB (ref 34.8–46.6)
HGB: 9.2 g/dL — ABNORMAL LOW (ref 11.6–15.9)
LYMPH%: 30.7 % (ref 14.0–49.7)
MCH: 28.4 pg (ref 25.1–34.0)
MCHC: 31.6 g/dL (ref 31.5–36.0)
MCV: 89.7 fL (ref 79.5–101.0)
MONO#: 0.6 10*3/uL (ref 0.1–0.9)
MONO%: 10.1 % (ref 0.0–14.0)
NEUT#: 3 10*3/uL (ref 1.5–6.5)
NEUT%: 52.4 % (ref 38.4–76.8)
PLATELETS: 262 10*3/uL (ref 145–400)
RBC: 3.23 10*6/uL — AB (ref 3.70–5.45)
RDW: 16.1 % — ABNORMAL HIGH (ref 11.2–14.5)
WBC: 5.6 10*3/uL (ref 3.9–10.3)
lymph#: 1.7 10*3/uL (ref 0.9–3.3)

## 2013-07-28 MED ORDER — FULVESTRANT 250 MG/5ML IM SOLN
500.0000 mg | Freq: Once | INTRAMUSCULAR | Status: AC
  Administered 2013-07-28: 500 mg via INTRAMUSCULAR
  Filled 2013-07-28: qty 10

## 2013-07-28 MED ORDER — CYANOCOBALAMIN 1000 MCG/ML IJ SOLN
1000.0000 ug | Freq: Once | INTRAMUSCULAR | Status: AC
  Administered 2013-07-28: 1000 ug via INTRAMUSCULAR

## 2013-07-28 MED ORDER — DARBEPOETIN ALFA-POLYSORBATE 200 MCG/0.4ML IJ SOLN
200.0000 ug | Freq: Once | INTRAMUSCULAR | Status: AC
  Administered 2013-07-28: 200 ug via SUBCUTANEOUS
  Filled 2013-07-28: qty 0.4

## 2013-07-28 NOTE — Telephone Encounter (Signed)
Informed patient

## 2013-07-30 ENCOUNTER — Other Ambulatory Visit: Payer: Self-pay | Admitting: Family Medicine

## 2013-07-30 NOTE — Telephone Encounter (Signed)
Refill-lantus  [facility]

## 2013-08-04 MED ORDER — INSULIN GLARGINE 100 UNIT/ML ~~LOC~~ SOLN: 60.0000 [IU] | Freq: Every day | SUBCUTANEOUS | Status: DC

## 2013-08-05 ENCOUNTER — Ambulatory Visit (INDEPENDENT_AMBULATORY_CARE_PROVIDER_SITE_OTHER): Payer: Medicare Other | Admitting: Cardiology

## 2013-08-05 ENCOUNTER — Encounter: Payer: Self-pay | Admitting: Cardiology

## 2013-08-05 VITALS — BP 136/82 | HR 61 | Ht 63.0 in | Wt 200.4 lb

## 2013-08-05 DIAGNOSIS — I25119 Atherosclerotic heart disease of native coronary artery with unspecified angina pectoris: Secondary | ICD-10-CM

## 2013-08-05 DIAGNOSIS — I208 Other forms of angina pectoris: Secondary | ICD-10-CM

## 2013-08-05 DIAGNOSIS — E785 Hyperlipidemia, unspecified: Secondary | ICD-10-CM

## 2013-08-05 DIAGNOSIS — I209 Angina pectoris, unspecified: Secondary | ICD-10-CM

## 2013-08-05 DIAGNOSIS — I2510 Atherosclerotic heart disease of native coronary artery without angina pectoris: Secondary | ICD-10-CM

## 2013-08-05 DIAGNOSIS — I10 Essential (primary) hypertension: Secondary | ICD-10-CM

## 2013-08-05 DIAGNOSIS — I2089 Other forms of angina pectoris: Secondary | ICD-10-CM | POA: Insufficient documentation

## 2013-08-05 MED ORDER — ISOSORBIDE MONONITRATE ER 60 MG PO TB24: 120.0000 mg | ORAL_TABLET | Freq: Every day | ORAL | Status: DC

## 2013-08-05 NOTE — Assessment & Plan Note (Signed)
Known RCA occlusion  Cardiac cath 07/07/13  1. Total occlusion of the RCA, chronic  2. Patency of the left main and LAD  3. Moderately severe ostial left circumflex stenosis  see above note

## 2013-08-05 NOTE — Assessment & Plan Note (Signed)
Much improved from hospital but still will occ have chest pressure and DOE, will increase Imdur to 120 mg daily.  If she still has problems we will see her back, otherwise follow up with Dr. Mamie Nick. [place] in oct.

## 2013-08-05 NOTE — Assessment & Plan Note (Signed)
Lipid Panel     Component Value Date/Time   CHOL 130 07/03/2013 1005   TRIG 142 07/03/2013 1005   HDL 22* 07/03/2013 1005   CHOLHDL 5.9 07/03/2013 1005   VLDL 28 07/03/2013 1005   LDLCALC 80 07/03/2013 1005   treated

## 2013-08-05 NOTE — Assessment & Plan Note (Signed)
controlled 

## 2013-08-05 NOTE — Patient Instructions (Addendum)
Brittney Kicks, NP has recommended making the following medication changes:  INCREASE Imdur to 2 tablets (120 mg total) daily  Brittney Cunningham has referred you to Cardiac Rehab Phase II.  Your physician recommends that you schedule a follow-up appointment in October with Dr P [place].

## 2013-08-05 NOTE — Progress Notes (Signed)
08/05/2013   PCP: Penni Homans, MD   Chief Complaint  Patient presents with  . Hospitalization Follow-up    Cath 6/8 Dr Ezzie Dural, no intervention. Patient reports very mild chest pressure, shortness of breath minimal exertion,     Primary Cardiologist:Dr. P. [place]   HPI:  73 y.o. female with a Past Medical History of known coronary artery disease, carotid artery stenosis status post right carotid endarterectomy and stenting in the left carotid, diabetes, hypertension, dyslipidemia, stage IV breast cancer who presented to [facility] with chest pain-07/05/13 and SOB. Neg. MI, cardiac cath revealed  Total occlusion of the RCA, chronic  2. Patency of the left main and LAD  3. Moderately severe ostial left circumflex stenosis. PCI of LCX would be complex so medical therapy planned.  Imdur added to medications with improvement. Today she is here for follow up.  Still with occ chest pressure and DOE.  No associated symptoms of nausea or diaphoresis.   Allergies  Allergen Reactions  . Chlorhexidine Hives, Itching and Rash    This was most likely a CONTACT DERMATITIS versus true systemic allergic reaction  . Adhesive [Tape] Hives  . Codeine Swelling    Current Outpatient Prescriptions  Medication Sig Dispense Refill  . aspirin 81 MG chewable tablet Chew 81 mg by mouth daily.      Marland Kitchen atorvastatin (LIPITOR) 80 MG tablet Take 1 tablet (80 mg total) by mouth daily.  30 tablet  0  . Blood Glucose Monitoring Suppl (ACCU-CHEK NANO SMARTVIEW) W/DEVICE KIT Use to check blood sugar twice a day. DX 250.00  1 kit  0  . carvedilol (COREG) 25 MG tablet Take 1 tablet (25 mg total) by mouth 2 (two) times daily with a meal.  60 tablet  0  . clopidogrel (PLAVIX) 75 MG tablet Take 1 tablet (75 mg total) by mouth daily.  30 tablet  8  . cyanocobalamin (,VITAMIN B-12,) 1000 MCG/ML injection Inject 1,000 mcg into the muscle every 30 (thirty) days. Last dose 03/09/2011      . darbepoetin (ARANESP) 200  MCG/0.4ML SOLN Inject 200 mcg into the skin once. Every 30 days      . ferrous fumarate (HEMOCYTE - 106 MG FE) 325 (106 FE) MG TABS tablet Take 1 tablet (106 mg of iron total) by mouth daily.  30 each  3  . glucose blood (ACCU-CHEK AVIVA) test strip Use as instructed to check blood sugar twice a day  Dx 250.00  100 each  3  . glycerin adult (GLYCERIN ADULT) 2 G SUPP Place 1 suppository rectally once as needed (constipation).  10 suppository  0  . hydrochlorothiazide (MICROZIDE) 12.5 MG capsule Take 1 capsule (12.5 mg total) by mouth daily.  30 capsule  3  . insulin glargine (LANTUS) 100 UNIT/ML injection Inject 0.6 mLs (60 Units total) into the skin at bedtime. Pt wants vials.  30 mL  1  . levothyroxine (SYNTHROID, LEVOTHROID) 200 MCG tablet Take 1 tablet (200 mcg total) by mouth daily before breakfast.  30 tablet  3  . levothyroxine (SYNTHROID, LEVOTHROID) 75 MCG tablet 41mg MOn  & Fridays with the 2021m daily      . losartan (COZAAR) 50 MG tablet Take 1 tablet (50 mg total) by mouth 2 (two) times daily.  60 tablet  1  . metFORMIN (GLUCOPHAGE) 500 MG tablet Take 1 tablet (500 mg total) by mouth every morning.  30 tablet  5  . morphine (  MS CONTIN) 30 MG 12 hr tablet Take 1 tablet (30 mg total) by mouth 2 (two) times daily.  60 tablet  0  . nitroGLYCERIN (NITROSTAT) 0.4 MG SL tablet Place 1 tablet (0.4 mg total) under the tongue every 5 (five) minutes as needed for chest pain.  30 tablet  0  . polyethylene glycol (MIRALAX / GLYCOLAX) packet Take 17 g by mouth daily as needed.      . isosorbide mononitrate (IMDUR) 60 MG 24 hr tablet Take 2 tablets (120 mg total) by mouth daily.  60 tablet  11  . [DISCONTINUED] simvastatin (ZOCOR) 40 MG tablet Take 40 mg by mouth every evening.       No current facility-administered medications for this visit.    Past Medical History  Diagnosis Date  . Anemia   . Depression   . Diabetes mellitus type II   . GERD (gastroesophageal reflux disease)   .  Hyperlipidemia   . Hypothyroidism   . Osteoarthritis   . Carotid artery stenosis     bilateral  . CAD (coronary artery disease)     medical therapy, old tot RCA, patent LAD, mod. severe ostial LCX stenosis  . OSA (obstructive sleep apnea)   . Breast cancer     right breast cancer-invasive  ductal carcinoma (StageIV)  . Hypertension   . Anxiety   . Carotid artery occlusion   . Unspecified constipation 03/15/2012  . Bursitis   . Collagen vascular disease   . History of radiation therapy 10/31/12-11/13/12    rt&lt humerous 30Gy/65f  . Valvular heart disease 07/11/2013    Past Surgical History  Procedure Laterality Date  . Incisional breast biopsy      remoted left breast biopsy  . Cesarean section    . Other surgical history      GYN surgery  . Knee surgery      right knee surgery  . Carotid endarterectomy      right carotid  . Port-a-cath removal    . Modified mastectomy      Right breast  . Breast surgery    . Mastectomy    . Carotid stent      left  . Cardiac catheterization  06/2013    old total occ. of RCA, moderately severe ostial LCX stenosis- medical therapy- would be complex PCI    RKZS:WFUXNAT:FTcolds or fevers, no weight changes Skin:no rashes or ulcers HEENT:no blurred vision, no congestion CV:see HPI PUL:see HPI GI:no diarrhea +constipation or melena, no indigestion GU:no hematuria, no dysuria MS:++ chronic joint pain, no claudication Neuro:no syncope, no lightheadedness Endo:+ diabetes stable, + thyroid disease  Wt Readings from Last 3 Encounters:  08/05/13 200 lb 6.4 oz (90.901 kg)  07/25/13 203 lb (92.08 kg)  07/11/13 203 lb (92.08 kg)    PHYSICAL EXAM BP 136/82  Pulse 61  Ht _0  (1.6 m)  Wt 200 lb 6.4 oz (90.901 kg)  BMI 35.51 kg/m2  LMP 03/13/2012 General:Pleasant affect, NAD Skin:Warm and dry, brisk capillary refill HEENT:normocephalic, sclera clear, mucus membranes moist Neck:supple, no JVD, no bruits  Heart:S1S2 RRR with soft  systolic murmur,no gallup, rub or click Lungs:clear without rales, rhonchi, or wheezes ADDU:KGUR non tender, + BS, do not palpate liver spleen or masses Ext:no lower ext edema, 2+ pedal pulses, 2+ radial pulses Neuro:alert and oriented, MAE, follows commands, + facial symmetry EKG:SR with flipped T wave in AVR V1 no new changes   ASSESSMENT AND PLAN Angina at rest Much improved from  hospital but still will occ have chest pressure and DOE, will increase Imdur to 120 mg daily.  If she still has problems we will see her back, otherwise follow up with Dr. Mamie Nick. [place] in oct.  CAD (coronary artery disease), native coronary artery Known RCA occlusion  Cardiac cath 07/07/13  1. Total occlusion of the RCA, chronic  2. Patency of the left main and LAD  3. Moderately severe ostial left circumflex stenosis  see above note  HYPERTENSION controlled  HYPERLIPIDEMIA Lipid Panel     Component Value Date/Time   CHOL 130 07/03/2013 1005   TRIG 142 07/03/2013 1005   HDL 22* 07/03/2013 1005   CHOLHDL 5.9 07/03/2013 1005   VLDL 28 07/03/2013 1005   LDLCALC 80 07/03/2013 1005   treated

## 2013-08-07 ENCOUNTER — Telehealth (HOSPITAL_COMMUNITY): Payer: Self-pay | Admitting: *Deleted

## 2013-08-13 ENCOUNTER — Other Ambulatory Visit: Payer: Self-pay

## 2013-08-13 DIAGNOSIS — C50911 Malignant neoplasm of unspecified site of right female breast: Secondary | ICD-10-CM

## 2013-08-13 DIAGNOSIS — C7951 Secondary malignant neoplasm of bone: Principal | ICD-10-CM

## 2013-08-13 MED ORDER — CARVEDILOL 25 MG PO TABS: 25.0000 mg | ORAL_TABLET | Freq: Two times a day (BID) | ORAL | Status: DC

## 2013-08-13 MED ORDER — ATORVASTATIN CALCIUM 80 MG PO TABS: 80.0000 mg | ORAL_TABLET | Freq: Every day | ORAL | Status: DC

## 2013-08-13 MED ORDER — MORPHINE SULFATE ER 30 MG PO TBCR: 30.0000 mg | EXTENDED_RELEASE_TABLET | Freq: Two times a day (BID) | ORAL | Status: DC

## 2013-08-13 NOTE — Telephone Encounter (Signed)
Refill placed in book for husband [name] to pick up on 7/16.

## 2013-08-21 ENCOUNTER — Telehealth (HOSPITAL_COMMUNITY): Payer: Self-pay | Admitting: *Deleted

## 2013-08-22 ENCOUNTER — Other Ambulatory Visit: Payer: Self-pay | Admitting: *Deleted

## 2013-08-22 DIAGNOSIS — C7951 Secondary malignant neoplasm of bone: Secondary | ICD-10-CM

## 2013-08-22 DIAGNOSIS — C50919 Malignant neoplasm of unspecified site of unspecified female breast: Secondary | ICD-10-CM

## 2013-08-22 DIAGNOSIS — C7952 Secondary malignant neoplasm of bone marrow: Secondary | ICD-10-CM

## 2013-08-25 ENCOUNTER — Other Ambulatory Visit (HOSPITAL_BASED_OUTPATIENT_CLINIC_OR_DEPARTMENT_OTHER): Payer: Medicare Other

## 2013-08-25 ENCOUNTER — Ambulatory Visit (HOSPITAL_BASED_OUTPATIENT_CLINIC_OR_DEPARTMENT_OTHER): Payer: Medicare Other

## 2013-08-25 VITALS — BP 133/64 | HR 63 | Temp 98.4°F

## 2013-08-25 DIAGNOSIS — C50911 Malignant neoplasm of unspecified site of right female breast: Secondary | ICD-10-CM

## 2013-08-25 DIAGNOSIS — C50919 Malignant neoplasm of unspecified site of unspecified female breast: Secondary | ICD-10-CM

## 2013-08-25 DIAGNOSIS — C7952 Secondary malignant neoplasm of bone marrow: Secondary | ICD-10-CM

## 2013-08-25 DIAGNOSIS — C7951 Secondary malignant neoplasm of bone: Secondary | ICD-10-CM

## 2013-08-25 DIAGNOSIS — N289 Disorder of kidney and ureter, unspecified: Secondary | ICD-10-CM

## 2013-08-25 DIAGNOSIS — C50619 Malignant neoplasm of axillary tail of unspecified female breast: Secondary | ICD-10-CM

## 2013-08-25 DIAGNOSIS — D649 Anemia, unspecified: Secondary | ICD-10-CM

## 2013-08-25 DIAGNOSIS — Z5111 Encounter for antineoplastic chemotherapy: Secondary | ICD-10-CM

## 2013-08-25 DIAGNOSIS — E538 Deficiency of other specified B group vitamins: Secondary | ICD-10-CM

## 2013-08-25 DIAGNOSIS — K219 Gastro-esophageal reflux disease without esophagitis: Secondary | ICD-10-CM

## 2013-08-25 LAB — CBC WITH DIFFERENTIAL/PLATELET
BASO%: 0.9 % (ref 0.0–2.0)
BASOS ABS: 0 10*3/uL (ref 0.0–0.1)
EOS%: 3.9 % (ref 0.0–7.0)
Eosinophils Absolute: 0.2 10*3/uL (ref 0.0–0.5)
HEMATOCRIT: 27.7 % — AB (ref 34.8–46.6)
HEMOGLOBIN: 8.7 g/dL — AB (ref 11.6–15.9)
LYMPH#: 1.5 10*3/uL (ref 0.9–3.3)
LYMPH%: 30.2 % (ref 14.0–49.7)
MCH: 28.2 pg (ref 25.1–34.0)
MCHC: 31.5 g/dL (ref 31.5–36.0)
MCV: 89.6 fL (ref 79.5–101.0)
MONO#: 0.5 10*3/uL (ref 0.1–0.9)
MONO%: 9.3 % (ref 0.0–14.0)
NEUT#: 2.7 10*3/uL (ref 1.5–6.5)
NEUT%: 55.7 % (ref 38.4–76.8)
PLATELETS: 229 10*3/uL (ref 145–400)
RBC: 3.09 10*6/uL — ABNORMAL LOW (ref 3.70–5.45)
RDW: 17 % — ABNORMAL HIGH (ref 11.2–14.5)
WBC: 4.9 10*3/uL (ref 3.9–10.3)

## 2013-08-25 LAB — COMPREHENSIVE METABOLIC PANEL (CC13)
ALT: 8 U/L (ref 0–55)
ANION GAP: 9 meq/L (ref 3–11)
AST: 16 U/L (ref 5–34)
Albumin: 3.1 g/dL — ABNORMAL LOW (ref 3.5–5.0)
Alkaline Phosphatase: 116 U/L (ref 40–150)
BUN: 27.8 mg/dL — ABNORMAL HIGH (ref 7.0–26.0)
CALCIUM: 9.3 mg/dL (ref 8.4–10.4)
CHLORIDE: 108 meq/L (ref 98–109)
CO2: 22 meq/L (ref 22–29)
Creatinine: 0.8 mg/dL (ref 0.6–1.1)
Glucose: 58 mg/dl — ABNORMAL LOW (ref 70–140)
Potassium: 4.4 mEq/L (ref 3.5–5.1)
Sodium: 139 mEq/L (ref 136–145)
Total Bilirubin: 0.4 mg/dL (ref 0.20–1.20)
Total Protein: 6.6 g/dL (ref 6.4–8.3)

## 2013-08-25 MED ORDER — CYANOCOBALAMIN 1000 MCG/ML IJ SOLN
1000.0000 ug | Freq: Once | INTRAMUSCULAR | Status: AC
  Administered 2013-08-25: 1000 ug via INTRAMUSCULAR

## 2013-08-25 MED ORDER — FULVESTRANT 250 MG/5ML IM SOLN
500.0000 mg | Freq: Once | INTRAMUSCULAR | Status: AC
  Administered 2013-08-25: 500 mg via INTRAMUSCULAR
  Filled 2013-08-25: qty 10

## 2013-08-25 MED ORDER — DARBEPOETIN ALFA-POLYSORBATE 200 MCG/0.4ML IJ SOLN
200.0000 ug | Freq: Once | INTRAMUSCULAR | Status: AC
  Administered 2013-08-25: 200 ug via SUBCUTANEOUS
  Filled 2013-08-25: qty 0.4

## 2013-08-29 ENCOUNTER — Telehealth: Payer: Self-pay | Admitting: *Deleted

## 2013-08-29 MED ORDER — LEVOTHYROXINE SODIUM 200 MCG PO TABS: 200.0000 ug | ORAL_TABLET | Freq: Every day | ORAL | Status: DC

## 2013-08-29 NOTE — Telephone Encounter (Signed)
Received fax from pharmacy requesting refill of levothyroxine 236mcg.  Refill sent.

## 2013-09-15 ENCOUNTER — Other Ambulatory Visit: Payer: Self-pay | Admitting: *Deleted

## 2013-09-15 DIAGNOSIS — C7951 Secondary malignant neoplasm of bone: Principal | ICD-10-CM

## 2013-09-15 DIAGNOSIS — C50911 Malignant neoplasm of unspecified site of right female breast: Secondary | ICD-10-CM

## 2013-09-15 MED ORDER — MORPHINE SULFATE ER 30 MG PO TBCR: 30.0000 mg | EXTENDED_RELEASE_TABLET | Freq: Two times a day (BID) | ORAL | Status: DC

## 2013-09-17 ENCOUNTER — Ambulatory Visit: Payer: Self-pay | Admitting: Family

## 2013-09-17 ENCOUNTER — Other Ambulatory Visit (HOSPITAL_COMMUNITY): Payer: Self-pay

## 2013-09-22 ENCOUNTER — Other Ambulatory Visit: Payer: Self-pay | Admitting: Oncology

## 2013-09-22 ENCOUNTER — Other Ambulatory Visit: Payer: Self-pay | Admitting: *Deleted

## 2013-09-22 ENCOUNTER — Ambulatory Visit (HOSPITAL_BASED_OUTPATIENT_CLINIC_OR_DEPARTMENT_OTHER): Payer: Medicare Other

## 2013-09-22 ENCOUNTER — Other Ambulatory Visit (HOSPITAL_BASED_OUTPATIENT_CLINIC_OR_DEPARTMENT_OTHER): Payer: Medicare Other

## 2013-09-22 VITALS — BP 182/86 | HR 63 | Temp 98.3°F

## 2013-09-22 DIAGNOSIS — C50919 Malignant neoplasm of unspecified site of unspecified female breast: Secondary | ICD-10-CM

## 2013-09-22 DIAGNOSIS — C7952 Secondary malignant neoplasm of bone marrow: Secondary | ICD-10-CM

## 2013-09-22 DIAGNOSIS — D649 Anemia, unspecified: Secondary | ICD-10-CM

## 2013-09-22 DIAGNOSIS — E538 Deficiency of other specified B group vitamins: Secondary | ICD-10-CM

## 2013-09-22 DIAGNOSIS — N289 Disorder of kidney and ureter, unspecified: Secondary | ICD-10-CM

## 2013-09-22 DIAGNOSIS — Z5111 Encounter for antineoplastic chemotherapy: Secondary | ICD-10-CM

## 2013-09-22 DIAGNOSIS — C7951 Secondary malignant neoplasm of bone: Secondary | ICD-10-CM

## 2013-09-22 DIAGNOSIS — C50619 Malignant neoplasm of axillary tail of unspecified female breast: Secondary | ICD-10-CM

## 2013-09-22 DIAGNOSIS — D631 Anemia in chronic kidney disease: Secondary | ICD-10-CM

## 2013-09-22 DIAGNOSIS — N189 Chronic kidney disease, unspecified: Principal | ICD-10-CM

## 2013-09-22 LAB — COMPREHENSIVE METABOLIC PANEL (CC13)
ALBUMIN: 3.3 g/dL — AB (ref 3.5–5.0)
ALT: 11 U/L (ref 0–55)
AST: 19 U/L (ref 5–34)
Alkaline Phosphatase: 99 U/L (ref 40–150)
Anion Gap: 8 mEq/L (ref 3–11)
BUN: 32.3 mg/dL — ABNORMAL HIGH (ref 7.0–26.0)
CO2: 20 mEq/L — ABNORMAL LOW (ref 22–29)
Calcium: 9.9 mg/dL (ref 8.4–10.4)
Chloride: 108 mEq/L (ref 98–109)
Creatinine: 1.3 mg/dL — ABNORMAL HIGH (ref 0.6–1.1)
GLUCOSE: 109 mg/dL (ref 70–140)
SODIUM: 137 meq/L (ref 136–145)
TOTAL PROTEIN: 7.1 g/dL (ref 6.4–8.3)
Total Bilirubin: 0.39 mg/dL (ref 0.20–1.20)

## 2013-09-22 LAB — CBC WITH DIFFERENTIAL/PLATELET
BASO%: 0.5 % (ref 0.0–2.0)
BASOS ABS: 0 10*3/uL (ref 0.0–0.1)
EOS%: 3.5 % (ref 0.0–7.0)
Eosinophils Absolute: 0.2 10*3/uL (ref 0.0–0.5)
HCT: 30.4 % — ABNORMAL LOW (ref 34.8–46.6)
HEMOGLOBIN: 9.3 g/dL — AB (ref 11.6–15.9)
LYMPH#: 1.8 10*3/uL (ref 0.9–3.3)
LYMPH%: 31 % (ref 14.0–49.7)
MCH: 28.1 pg (ref 25.1–34.0)
MCHC: 30.6 g/dL — ABNORMAL LOW (ref 31.5–36.0)
MCV: 91.8 fL (ref 79.5–101.0)
MONO#: 0.4 10*3/uL (ref 0.1–0.9)
MONO%: 7.3 % (ref 0.0–14.0)
NEUT#: 3.4 10*3/uL (ref 1.5–6.5)
NEUT%: 57.7 % (ref 38.4–76.8)
Platelets: 258 10*3/uL (ref 145–400)
RBC: 3.31 10*6/uL — ABNORMAL LOW (ref 3.70–5.45)
RDW: 15.9 % — ABNORMAL HIGH (ref 11.2–14.5)
WBC: 5.9 10*3/uL (ref 3.9–10.3)
nRBC: 0 % (ref 0–0)

## 2013-09-22 MED ORDER — DARBEPOETIN ALFA-POLYSORBATE 200 MCG/0.4ML IJ SOLN
200.0000 ug | Freq: Once | INTRAMUSCULAR | Status: AC
  Administered 2013-09-22: 200 ug via SUBCUTANEOUS
  Filled 2013-09-22: qty 0.4

## 2013-09-22 MED ORDER — FULVESTRANT 250 MG/5ML IM SOLN
500.0000 mg | Freq: Once | INTRAMUSCULAR | Status: AC
  Administered 2013-09-22: 500 mg via INTRAMUSCULAR
  Filled 2013-09-22: qty 10

## 2013-09-22 MED ORDER — CYANOCOBALAMIN 1000 MCG/ML IJ SOLN
1000.0000 ug | Freq: Once | INTRAMUSCULAR | Status: AC
  Administered 2013-09-22: 1000 ug via INTRAMUSCULAR

## 2013-09-22 NOTE — Progress Notes (Unsigned)
I called daily to n potassium was elevated. I could not reach her. I left a message on your phone for her to read no fruit, stop any potassium supplements, take an extra dose of hydrochlorothiazide, and, tomorrow for recheck.

## 2013-09-23 ENCOUNTER — Ambulatory Visit (INDEPENDENT_AMBULATORY_CARE_PROVIDER_SITE_OTHER): Payer: Medicare Other | Admitting: Family Medicine

## 2013-09-23 ENCOUNTER — Telehealth: Payer: Self-pay | Admitting: *Deleted

## 2013-09-23 ENCOUNTER — Telehealth: Payer: Self-pay

## 2013-09-23 ENCOUNTER — Encounter: Payer: Self-pay | Admitting: Family Medicine

## 2013-09-23 VITALS — BP 130/82 | HR 72 | Temp 98.5°F | Ht 63.0 in | Wt 194.1 lb

## 2013-09-23 DIAGNOSIS — N390 Urinary tract infection, site not specified: Secondary | ICD-10-CM

## 2013-09-23 DIAGNOSIS — R7989 Other specified abnormal findings of blood chemistry: Secondary | ICD-10-CM

## 2013-09-23 DIAGNOSIS — R946 Abnormal results of thyroid function studies: Secondary | ICD-10-CM

## 2013-09-23 DIAGNOSIS — R197 Diarrhea, unspecified: Secondary | ICD-10-CM

## 2013-09-23 DIAGNOSIS — E875 Hyperkalemia: Secondary | ICD-10-CM

## 2013-09-23 DIAGNOSIS — R112 Nausea with vomiting, unspecified: Secondary | ICD-10-CM

## 2013-09-23 DIAGNOSIS — E039 Hypothyroidism, unspecified: Secondary | ICD-10-CM

## 2013-09-23 LAB — CBC
HCT: 30.4 % — ABNORMAL LOW (ref 36.0–46.0)
Hemoglobin: 9.7 g/dL — ABNORMAL LOW (ref 12.0–15.0)
MCH: 28.4 pg (ref 26.0–34.0)
MCHC: 31.9 g/dL (ref 30.0–36.0)
MCV: 89.1 fL (ref 78.0–100.0)
Platelets: 306 10*3/uL (ref 150–400)
RBC: 3.41 MIL/uL — AB (ref 3.87–5.11)
RDW: 15.9 % — ABNORMAL HIGH (ref 11.5–15.5)
WBC: 6.2 10*3/uL (ref 4.0–10.5)

## 2013-09-23 LAB — T4, FREE: FREE T4: 2.03 ng/dL — AB (ref 0.80–1.80)

## 2013-09-23 LAB — TSH: TSH: 0.038 u[IU]/mL — AB (ref 0.350–4.500)

## 2013-09-23 MED ORDER — ONDANSETRON HCL 4 MG PO TABS: 4.0000 mg | ORAL_TABLET | Freq: Three times a day (TID) | ORAL | Status: DC | PRN

## 2013-09-23 NOTE — Progress Notes (Signed)
Pre visit review using our clinic review tool, if applicable. No additional management support is needed unless otherwise documented below in the visit note. 

## 2013-09-23 NOTE — Telephone Encounter (Signed)
See if they will recheck it today, if not I need a repeat today or tomorrow

## 2013-09-23 NOTE — Telephone Encounter (Signed)
Brittney Cunningham w/Dr Magrinat's office called stating that pts potasium was 6.0 yesterday and that MD should probably recheck this? Pt does have another appt with Dr Doris Cheadle today  Please advise?

## 2013-09-23 NOTE — Telephone Encounter (Signed)
Dr Olevia Perches office is checking again today according to Dr's notes and theres a lab order for this to be rechecked today.  Per Dr Charlett Blake just check in a couple of days to make sure patient did do these labs

## 2013-09-23 NOTE — Telephone Encounter (Signed)
Noted potassium of 6 per lab draw 8/24- this RN called pt to discuss.  Per phone discussion- Brittney Cunningham states she has had diarrhea- then developed a UTI - and is scheduled to see her primary MD this AM - Dr Gwyneth Revels-   Brittney Cunningham verbalizes understanding if elevated potassium- and need for recheck today for appropriate interventions. [name] will inquire with Dr Randel Pigg this am-  This RN called to Dr Azalee Course and informed med tech - Alyse Low of lab value and need for recheck. Alyse Low states she will inform Dr Randel Pigg.

## 2013-09-24 ENCOUNTER — Other Ambulatory Visit: Payer: Self-pay | Admitting: Family Medicine

## 2013-09-24 ENCOUNTER — Emergency Department (HOSPITAL_BASED_OUTPATIENT_CLINIC_OR_DEPARTMENT_OTHER)
Admission: EM | Admit: 2013-09-24 | Discharge: 2013-09-25 | Disposition: A | Discharge status: Home / Self Care | Payer: Medicare Other | Attending: Emergency Medicine | Admitting: Emergency Medicine

## 2013-09-24 ENCOUNTER — Telehealth: Payer: Self-pay

## 2013-09-24 ENCOUNTER — Encounter (HOSPITAL_BASED_OUTPATIENT_CLINIC_OR_DEPARTMENT_OTHER): Payer: Self-pay | Admitting: Emergency Medicine

## 2013-09-24 DIAGNOSIS — M545 Low back pain, unspecified: Secondary | ICD-10-CM | POA: Insufficient documentation

## 2013-09-24 DIAGNOSIS — F411 Generalized anxiety disorder: Secondary | ICD-10-CM | POA: Insufficient documentation

## 2013-09-24 DIAGNOSIS — Z853 Personal history of malignant neoplasm of breast: Secondary | ICD-10-CM | POA: Insufficient documentation

## 2013-09-24 DIAGNOSIS — Z87891 Personal history of nicotine dependence: Secondary | ICD-10-CM | POA: Insufficient documentation

## 2013-09-24 DIAGNOSIS — Z862 Personal history of diseases of the blood and blood-forming organs and certain disorders involving the immune mechanism: Secondary | ICD-10-CM | POA: Diagnosis not present

## 2013-09-24 DIAGNOSIS — E119 Type 2 diabetes mellitus without complications: Secondary | ICD-10-CM | POA: Diagnosis not present

## 2013-09-24 DIAGNOSIS — F3289 Other specified depressive episodes: Secondary | ICD-10-CM | POA: Insufficient documentation

## 2013-09-24 DIAGNOSIS — E875 Hyperkalemia: Secondary | ICD-10-CM | POA: Insufficient documentation

## 2013-09-24 DIAGNOSIS — M79651 Pain in right thigh: Secondary | ICD-10-CM

## 2013-09-24 DIAGNOSIS — I2510 Atherosclerotic heart disease of native coronary artery without angina pectoris: Secondary | ICD-10-CM | POA: Insufficient documentation

## 2013-09-24 DIAGNOSIS — E785 Hyperlipidemia, unspecified: Secondary | ICD-10-CM | POA: Diagnosis not present

## 2013-09-24 DIAGNOSIS — F329 Major depressive disorder, single episode, unspecified: Secondary | ICD-10-CM | POA: Insufficient documentation

## 2013-09-24 DIAGNOSIS — Z79899 Other long term (current) drug therapy: Secondary | ICD-10-CM | POA: Insufficient documentation

## 2013-09-24 DIAGNOSIS — K219 Gastro-esophageal reflux disease without esophagitis: Secondary | ICD-10-CM | POA: Insufficient documentation

## 2013-09-24 DIAGNOSIS — Z923 Personal history of irradiation: Secondary | ICD-10-CM | POA: Diagnosis not present

## 2013-09-24 DIAGNOSIS — Z7982 Long term (current) use of aspirin: Secondary | ICD-10-CM | POA: Insufficient documentation

## 2013-09-24 DIAGNOSIS — M79652 Pain in left thigh: Secondary | ICD-10-CM

## 2013-09-24 DIAGNOSIS — I10 Essential (primary) hypertension: Secondary | ICD-10-CM | POA: Insufficient documentation

## 2013-09-24 DIAGNOSIS — Z8669 Personal history of other diseases of the nervous system and sense organs: Secondary | ICD-10-CM | POA: Diagnosis not present

## 2013-09-24 DIAGNOSIS — M79609 Pain in unspecified limb: Secondary | ICD-10-CM | POA: Diagnosis not present

## 2013-09-24 DIAGNOSIS — E039 Hypothyroidism, unspecified: Secondary | ICD-10-CM | POA: Insufficient documentation

## 2013-09-24 DIAGNOSIS — M1990 Unspecified osteoarthritis, unspecified site: Secondary | ICD-10-CM | POA: Insufficient documentation

## 2013-09-24 DIAGNOSIS — E0590 Thyrotoxicosis, unspecified without thyrotoxic crisis or storm: Secondary | ICD-10-CM

## 2013-09-24 LAB — URINE CULTURE
Colony Count: NO GROWTH
Organism ID, Bacteria: NO GROWTH

## 2013-09-24 LAB — URINALYSIS
Bilirubin Urine: NEGATIVE
Glucose, UA: NEGATIVE mg/dL
Hgb urine dipstick: NEGATIVE
KETONES UR: NEGATIVE mg/dL
Leukocytes, UA: NEGATIVE
NITRITE: NEGATIVE
PH: 6 (ref 5.0–8.0)
Protein, ur: NEGATIVE mg/dL
Specific Gravity, Urine: 1.013 (ref 1.005–1.030)
Urobilinogen, UA: 0.2 mg/dL (ref 0.0–1.0)

## 2013-09-24 LAB — COMPREHENSIVE METABOLIC PANEL
ALBUMIN: 3.8 g/dL (ref 3.5–5.2)
ALT: 14 U/L (ref 0–35)
AST: 25 U/L (ref 0–37)
Alkaline Phosphatase: 109 U/L (ref 39–117)
BUN: 23 mg/dL (ref 6–23)
CALCIUM: 9.6 mg/dL (ref 8.4–10.5)
CHLORIDE: 104 meq/L (ref 96–112)
CO2: 22 meq/L (ref 19–32)
Creat: 1.06 mg/dL (ref 0.50–1.10)
Glucose, Bld: 118 mg/dL — ABNORMAL HIGH (ref 70–99)
POTASSIUM: 5.8 meq/L — AB (ref 3.5–5.3)
Sodium: 135 mEq/L (ref 135–145)
Total Bilirubin: 0.4 mg/dL (ref 0.2–1.2)
Total Protein: 7.2 g/dL (ref 6.0–8.3)

## 2013-09-24 MED ORDER — CYCLOBENZAPRINE HCL 10 MG PO TABS
10.0000 mg | ORAL_TABLET | Freq: Once | ORAL | Status: AC
  Administered 2013-09-25: 10 mg via ORAL
  Filled 2013-09-24: qty 1

## 2013-09-24 MED ORDER — OXYCODONE-ACETAMINOPHEN 5-325 MG PO TABS
1.0000 | ORAL_TABLET | Freq: Once | ORAL | Status: AC
  Administered 2013-09-25: 1 via ORAL
  Filled 2013-09-24: qty 1

## 2013-09-24 NOTE — ED Notes (Signed)
Pt c/o diarrhea 2 weeks ago, was seen at urgent care for the diarrhea, dx with uti, started on septra, on thurs of last week pt stated spitting up tan phlehgm, today called dr. Randel Pigg because she felt like they did not give her enough medication, yesterday she received a call  stating that her potassium was low, they had pt come back into office and obtained another urine and sent for a culture. Pt states she came in today because she developed lower back and pain radiating down both legs, pt has taken morphine pill at home

## 2013-09-24 NOTE — Telephone Encounter (Signed)
Urinalysis and culture negative, consult ordered

## 2013-09-24 NOTE — ED Provider Notes (Addendum)
CSN: [id number]     Arrival date & time 09/24/13  2142 History  This chart was scribed for Delora Fuel, MD by Martinique Peace, ED Scribe. The patient was seen in [facility]. The patient's care was started at 11:48 PM.    Chief Complaint  Patient presents with  . Leg Pain      Patient is a 73 y.o. female presenting with leg pain. The history is provided by the patient and a relative.  Leg Pain Location:  Leg Leg location:  L leg and R leg Pain details:    Radiates to:  Back Associated symptoms: back pain   Associated symptoms: no fever   HPI Comments: Brittney Cunningham is a 73 y.o. female who presents to the Emergency Department complaining of bilateral leg pain and lower back pain onset yesterday. Pt reports pain is 7/10 currently. She reports that when she showers, the hot water provides her with momentary relief. Pt reports that she believes it is the muscles in her legs and back that are giving her problems. She reports pain is exacerbated with ambulation and she usually has some trouble getting around the house. Pt has been taking Morphine at home. Pt is a former smoker.   Past Medical History  Diagnosis Date  . Anemia   . Depression   . Diabetes mellitus type II   . GERD (gastroesophageal reflux disease)   . Hyperlipidemia   . Hypothyroidism   . Osteoarthritis   . Carotid artery stenosis     bilateral  . CAD (coronary artery disease)     medical therapy, old tot RCA, patent LAD, mod. severe ostial LCX stenosis  . OSA (obstructive sleep apnea)   . Breast cancer     right breast cancer-invasive  ductal carcinoma (StageIV)  . Hypertension   . Anxiety   . Carotid artery occlusion   . Unspecified constipation 03/15/2012  . Bursitis   . Collagen vascular disease   . History of radiation therapy 10/31/12-11/13/12    rt&lt humerous 30Gy/10fx  . Valvular heart disease 07/11/2013   Past Surgical History  Procedure Laterality Date  . Incisional breast biopsy      remoted left  breast biopsy  . Cesarean section    . Other surgical history      GYN surgery  . Knee surgery      right knee surgery  . Carotid endarterectomy      right carotid  . Port-a-cath removal    . Modified mastectomy      Right breast  . Breast surgery    . Mastectomy    . Carotid stent      left  . Cardiac catheterization  06/2013    old total occ. of RCA, moderately severe ostial LCX stenosis- medical therapy- would be complex PCI   Family History  Problem Relation Age of Onset  . Arthritis    . Coronary artery disease      first degree relative  . Stroke      first degree relative  . Diabetes Mother   . Heart disease Mother   . Hypertension Mother   . Cancer Mother   . Deep vein thrombosis Mother   . Colon cancer Neg Hx   . Stomach cancer Neg Hx   . Cancer Father     Pancreatic cancer  . Breast cancer Paternal Aunt 38   History  Substance Use Topics  . Smoking status: Former Smoker -- 1.00 packs/day for 20 years  Types: Cigarettes    Quit date: 01/31/1988  . Smokeless tobacco: Never Used  . Alcohol Use: No   OB History   Grav Para Term Preterm Abortions TAB SAB Ect Mult Living                 Review of Systems  Constitutional: Negative for fever and chills.  Gastrointestinal: Negative for nausea, vomiting and diarrhea.  Genitourinary: Negative for dysuria.  Musculoskeletal: Positive for back pain.       Bilateral leg pain.   All other systems reviewed and are negative.     Allergies  Chlorhexidine; Adhesive; and Codeine  Home Medications   Prior to Admission medications   Medication Sig Start Date End Date Taking? Authorizing Provider  aspirin 81 MG chewable tablet Chew 81 mg by mouth daily.   Yes Historical Provider, MD  atorvastatin (LIPITOR) 80 MG tablet Take 1 tablet (80 mg total) by mouth daily. 08/13/13  Yes Peter M Martinique, MD  Blood Glucose Monitoring Suppl (ACCU-CHEK NANO SMARTVIEW) W/DEVICE KIT Use to check blood sugar twice a day. DX 250.00  03/28/13  Yes Mosie Lukes, MD  carvedilol (COREG) 25 MG tablet Take 1 tablet (25 mg total) by mouth 2 (two) times daily with a meal. 08/13/13  Yes Peter M Martinique, MD  clopidogrel (PLAVIX) 75 MG tablet Take 1 tablet (75 mg total) by mouth daily. 03/06/13  Yes Serafina Mitchell, MD  cyanocobalamin (,VITAMIN B-12,) 1000 MCG/ML injection Inject 1,000 mcg into the muscle every 30 (thirty) days. Last dose 03/09/2011   Yes Historical Provider, MD  ferrous fumarate (HEMOCYTE - 106 MG FE) 325 (106 FE) MG TABS tablet Take 1 tablet (106 mg of iron total) by mouth daily. 07/14/13  Yes Mosie Lukes, MD  glucose blood (ACCU-CHEK AVIVA) test strip Use as instructed to check blood sugar twice a day  Dx 250.00 03/28/13  Yes Mosie Lukes, MD  glycerin adult (GLYCERIN ADULT) 2 G SUPP Place 1 suppository rectally once as needed (constipation). 04/13/12  Yes Julianne Rice, MD  hydrochlorothiazide (MICROZIDE) 12.5 MG capsule Take 1 capsule (12.5 mg total) by mouth daily. 07/11/13  Yes Mosie Lukes, MD  insulin glargine (LANTUS) 100 UNIT/ML injection Inject 0.6 mLs (60 Units total) into the skin at bedtime. Pt wants vials.   Yes Mosie Lukes, MD  isosorbide mononitrate (IMDUR) 60 MG 24 hr tablet Take 2 tablets (120 mg total) by mouth daily. 08/05/13  Yes Cecilie Kicks, NP  levothyroxine (SYNTHROID, LEVOTHROID) 200 MCG tablet Take 1 tablet (200 mcg total) by mouth daily before breakfast. 08/29/13  Yes Mosie Lukes, MD  levothyroxine (SYNTHROID, LEVOTHROID) 75 MCG tablet 12mcg MOn  & Fridays with the 26mcg daily 03/26/13  Yes Mosie Lukes, MD  losartan (COZAAR) 50 MG tablet Take 1 tablet (50 mg total) by mouth 2 (two) times daily. 07/11/13  Yes Mosie Lukes, MD  metFORMIN (GLUCOPHAGE) 500 MG tablet Take 1 tablet (500 mg total) by mouth every morning. 04/03/13  Yes Mosie Lukes, MD  morphine (MS CONTIN) 30 MG 12 hr tablet Take 1 tablet (30 mg total) by mouth 2 (two) times daily. 09/15/13  Yes Chauncey Cruel, MD   nitroGLYCERIN (NITROSTAT) 0.4 MG SL tablet Place 1 tablet (0.4 mg total) under the tongue every 5 (five) minutes as needed for chest pain. 07/08/13  Yes Janece Canterbury, MD  ondansetron (ZOFRAN) 4 MG tablet Take 1 tablet (4 mg total) by mouth every 8 (eight)  hours as needed for nausea or vomiting. 09/23/13  Yes Mosie Lukes, MD  polyethylene glycol (MIRALAX / GLYCOLAX) packet Take 17 g by mouth daily as needed.   Yes Historical Provider, MD  darbepoetin (ARANESP) 200 MCG/0.4ML SOLN Inject 200 mcg into the skin once. Every 30 days    Historical Provider, MD   BP 175/51  Pulse 71  Temp(Src) 99.2 F (37.3 C) (Oral)  Resp 18  Ht 5' 2.5" (1.588 m)  Wt 194 lb (87.998 kg)  BMI 34.90 kg/m2  SpO2 100%  LMP 03/13/2012 Physical Exam  Nursing note and vitals reviewed. Constitutional: She is oriented to person, place, and time. She appears well-developed and well-nourished. No distress.  HENT:  Head: Normocephalic and atraumatic.  Eyes: Conjunctivae and EOM are normal. Pupils are equal, round, and reactive to light.  Neck: Neck supple. No JVD present. No tracheal deviation present.  Cardiovascular: Normal rate, regular rhythm and normal heart sounds.   No murmur heard. Pulmonary/Chest: Effort normal and breath sounds normal. No respiratory distress. She has no wheezes. She has no rales.  Abdominal: Soft. Bowel sounds are normal. She exhibits no distension and no mass. There is no tenderness.  Musculoskeletal: Normal range of motion. She exhibits edema and tenderness.  Mild tenderness and mild to moderate spasm of right parallel lumbar area. Mild tenderness to palpation of anterior thigh bilaterally. Pain on passive ROM of both hips. 1+ pitting edema.   Lymphadenopathy:    She has no cervical adenopathy.  Neurological: She is alert and oriented to person, place, and time. She has normal reflexes. No cranial nerve deficit. Coordination normal.  Skin: Skin is warm and dry. No rash noted.   Psychiatric: She has a normal mood and affect. Her behavior is normal. Thought content normal.    ED Course  Procedures (including critical care time) Labs Review Labs Reviewed - No data to display  Results for orders placed during the hospital encounter of 09/24/13  CBC WITH DIFFERENTIAL      Result Value Ref Range   WBC 7.4  4.0 - 10.5 K/uL   RBC 3.11 (*) 3.87 - 5.11 MIL/uL   Hemoglobin 9.1 (*) 12.0 - 15.0 g/dL   HCT 29.5 (*) 36.0 - 46.0 %   MCV 94.9  78.0 - 100.0 fL   MCH 29.3  26.0 - 34.0 pg   MCHC 30.8  30.0 - 36.0 g/dL   RDW 15.9 (*) 11.5 - 15.5 %   Platelets 259  150 - 400 K/uL   Neutrophils Relative % 59  43 - 77 %   Neutro Abs 4.3  1.7 - 7.7 K/uL   Lymphocytes Relative 28  12 - 46 %   Lymphs Abs 2.1  0.7 - 4.0 K/uL   Monocytes Relative 10  3 - 12 %   Monocytes Absolute 0.8  0.1 - 1.0 K/uL   Eosinophils Relative 3  0 - 5 %   Eosinophils Absolute 0.2  0.0 - 0.7 K/uL   Basophils Relative 0  0 - 1 %   Basophils Absolute 0.0  0.0 - 0.1 K/uL  BASIC METABOLIC PANEL      Result Value Ref Range   Sodium 138  137 - 147 mEq/L   Potassium 6.3 (*) 3.7 - 5.3 mEq/L   Chloride 102  96 - 112 mEq/L   CO2 22  19 - 32 mEq/L   Glucose, Bld 117 (*) 70 - 99 mg/dL   BUN 25 (*) 6 - 23 mg/dL  Creatinine, Ser 1.20 (*) 0.50 - 1.10 mg/dL   Calcium 9.8  8.4 - 10.5 mg/dL   GFR calc non Af Amer 44 (*) >90 mL/min   GFR calc Af Amer 51 (*) >90 mL/min   Anion gap 14  5 - 15  CK      Result Value Ref Range   Total CK 126  7 - 177 U/L   ECG shows normal sinus rhythm with a rate of 69, no ectopy. Normal axis. Normal P wave. Normal QRS. Normal intervals. Normal ST and T waves. Impression: normal ECG. And compared with ECG of 07/05/2013, no significant changes are seen.  Medications  cyclobenzaprine (FLEXERIL) tablet 10 mg (10 mg Oral Given 09/25/13 0009)  oxyCODONE-acetaminophen (PERCOCET/ROXICET) 5-325 MG per tablet 1 tablet (1 tablet Oral Given 09/25/13 0009)  sodium polystyrene (KAYEXALATE)  15 GM/60ML suspension 30 g (30 g Oral Given 09/25/13 0120)    11:54 PM- Treatment plan was discussed with patient who verbalizes understanding and agrees which includes pain medication and muscle relaxer.  MDM   Final diagnoses:  Low back pain, unspecified back pain laterality, with sciatica presence unspecified  Pain of both thighs  Hyperkalemia    Leg pain of uncertain cause. Old records are reviewed and recent urinalysis that is negative and the culture was no growth. She did have mild to moderate hyperkalemia so the choice will be rechecked. Leg pain is probably related to lower back issues and sciatica. She'll be given a therapeutic trial of cyclobenzaprine and oxycodone-acetaminophen.f she had good relief of symptoms with above noted treatment electrolytes are significant for potassium of 6.3. EKG is obtained showing no evidence of hyperkalemia. She's given a dose of Kayexalate. Her medications were reviewed and she is on the chart and it is associated with increased risk of hyperkalemia. She's advised to discontinue her losartan and she is discharged with prescriptions for oxycodone-acetaminophen and cyclobenzaprine. She's to follow up with her PCP in the next 2 days to recheck her potassium.  I personally performed the services described in this documentation, which was scribed in my presence. The recorded information has been reviewed and is accurate.     Delora Fuel, MD 85/[id number]  Delora Fuel, MD 20/03/79 [id number]

## 2013-09-24 NOTE — ED Notes (Signed)
Pt received her monthly injection from cancer center on Monday and states that she usually has joint pain for several days when she gets them, but this time the pain is worse and in her back " not her joints"

## 2013-09-24 NOTE — Telephone Encounter (Signed)
Spoke with pt about lab results. She had 2 questions. "she wanted to know what the result were for her urinalysis and when will she get an appt with Endocrinologist?" LDM

## 2013-09-25 ENCOUNTER — Telehealth: Payer: Self-pay | Admitting: Family Medicine

## 2013-09-25 LAB — BASIC METABOLIC PANEL
ANION GAP: 14 (ref 5–15)
BUN: 25 mg/dL — ABNORMAL HIGH (ref 6–23)
CHLORIDE: 102 meq/L (ref 96–112)
CO2: 22 mEq/L (ref 19–32)
Calcium: 9.8 mg/dL (ref 8.4–10.5)
Creatinine, Ser: 1.2 mg/dL — ABNORMAL HIGH (ref 0.50–1.10)
GFR calc non Af Amer: 44 mL/min — ABNORMAL LOW (ref >90)
GFR, EST AFRICAN AMERICAN: 51 mL/min — AB (ref >90)
Glucose, Bld: 117 mg/dL — ABNORMAL HIGH (ref 70–99)
POTASSIUM: 6.3 meq/L — AB (ref 3.7–5.3)
Sodium: 138 mEq/L (ref 137–147)

## 2013-09-25 LAB — CBC WITH DIFFERENTIAL/PLATELET
Basophils Absolute: 0 10*3/uL (ref 0.0–0.1)
Basophils Relative: 0 % (ref 0–1)
Eosinophils Absolute: 0.2 10*3/uL (ref 0.0–0.7)
Eosinophils Relative: 3 % (ref 0–5)
HCT: 29.5 % — ABNORMAL LOW (ref 36.0–46.0)
HEMOGLOBIN: 9.1 g/dL — AB (ref 12.0–15.0)
LYMPHS ABS: 2.1 10*3/uL (ref 0.7–4.0)
Lymphocytes Relative: 28 % (ref 12–46)
MCH: 29.3 pg (ref 26.0–34.0)
MCHC: 30.8 g/dL (ref 30.0–36.0)
MCV: 94.9 fL (ref 78.0–100.0)
MONOS PCT: 10 % (ref 3–12)
Monocytes Absolute: 0.8 10*3/uL (ref 0.1–1.0)
NEUTROS ABS: 4.3 10*3/uL (ref 1.7–7.7)
Neutrophils Relative %: 59 % (ref 43–77)
Platelets: 259 10*3/uL (ref 150–400)
RBC: 3.11 MIL/uL — AB (ref 3.87–5.11)
RDW: 15.9 % — ABNORMAL HIGH (ref 11.5–15.5)
WBC: 7.4 10*3/uL (ref 4.0–10.5)

## 2013-09-25 LAB — CK: CK TOTAL: 126 U/L (ref 7–177)

## 2013-09-25 MED ORDER — OXYCODONE-ACETAMINOPHEN 5-325 MG PO TABS
1.0000 | ORAL_TABLET | ORAL | Status: DC | PRN
Start: 2013-09-25 — End: 2014-01-07

## 2013-09-25 MED ORDER — CYCLOBENZAPRINE HCL 10 MG PO TABS: 10.0000 mg | ORAL_TABLET | Freq: Three times a day (TID) | ORAL | Status: DC | PRN

## 2013-09-25 MED ORDER — SODIUM POLYSTYRENE SULFONATE 15 GM/60ML PO SUSP
30.0000 g | Freq: Once | ORAL | Status: AC
Start: 2013-09-25 — End: 2013-09-25
  Administered 2013-09-25: 30 g via ORAL
  Filled 2013-09-25: qty 120

## 2013-09-25 NOTE — Discharge Instructions (Signed)
Stop taking Losartan - it is probably what is causing your potassium to go up. See your doctor on Friday to recheck the potassium level.  Acetaminophen; Oxycodone tablets What is this medicine? ACETAMINOPHEN; OXYCODONE (a set a MEE noe fen; ox i KOE done) is a pain reliever. It is used to treat mild to moderate pain. This medicine may be used for other purposes; ask your health care provider or pharmacist if you have questions. COMMON BRAND NAME(S): Endocet, Magnacet, Narvox, Percocet, Perloxx, Primalev, Primlev, Roxicet, Xolox What should I tell my health care provider before I take this medicine? They need to know if you have any of these conditions: -brain tumor -Crohn's disease, inflammatory bowel disease, or ulcerative colitis -drug abuse or addiction -head injury -heart or circulation problems -if you often drink alcohol -kidney disease or problems going to the bathroom -liver disease -lung disease, asthma, or breathing problems -an unusual or allergic reaction to acetaminophen, oxycodone, other opioid analgesics, other medicines, foods, dyes, or preservatives -pregnant or trying to get pregnant -breast-feeding How should I use this medicine? Take this medicine by mouth with a full glass of water. Follow the directions on the prescription label. Take your medicine at regular intervals. Do not take your medicine more often than directed. Talk to your pediatrician regarding the use of this medicine in children. Special care may be needed. Patients over 105 years old may have a stronger reaction and need a smaller dose. Overdosage: If you think you have taken too much of this medicine contact a poison control center or emergency room at once. NOTE: This medicine is only for you. Do not share this medicine with others. What if I miss a dose? If you miss a dose, take it as soon as you can. If it is almost time for your next dose, take only that dose. Do not take double or extra  doses. What may interact with this medicine? -alcohol -antihistamines -barbiturates like amobarbital, butalbital, butabarbital, methohexital, pentobarbital, phenobarbital, thiopental, and secobarbital -benztropine -drugs for bladder problems like solifenacin, trospium, oxybutynin, tolterodine, hyoscyamine, and methscopolamine -drugs for breathing problems like ipratropium and tiotropium -drugs for certain stomach or intestine problems like propantheline, homatropine methylbromide, glycopyrrolate, atropine, belladonna, and dicyclomine -general anesthetics like etomidate, ketamine, nitrous oxide, propofol, desflurane, enflurane, halothane, isoflurane, and sevoflurane -medicines for depression, anxiety, or psychotic disturbances -medicines for sleep -muscle relaxants -naltrexone -narcotic medicines (opiates) for pain -phenothiazines like perphenazine, thioridazine, chlorpromazine, mesoridazine, fluphenazine, prochlorperazine, promazine, and trifluoperazine -scopolamine -tramadol -trihexyphenidyl This list may not describe all possible interactions. Give your health care provider a list of all the medicines, herbs, non-prescription drugs, or dietary supplements you use. Also tell them if you smoke, drink alcohol, or use illegal drugs. Some items may interact with your medicine. What should I watch for while using this medicine? Tell your doctor or health care professional if your pain does not go away, if it gets worse, or if you have new or a different type of pain. You may develop tolerance to the medicine. Tolerance means that you will need a higher dose of the medication for pain relief. Tolerance is normal and is expected if you take this medicine for a long time. Do not suddenly stop taking your medicine because you may develop a severe reaction. Your body becomes used to the medicine. This does NOT mean you are addicted. Addiction is a behavior related to getting and using a drug for a  non-medical reason. If you have pain, you have a medical reason  to take pain medicine. Your doctor will tell you how much medicine to take. If your doctor wants you to stop the medicine, the dose will be slowly lowered over time to avoid any side effects. You may get drowsy or dizzy. Do not drive, use machinery, or do anything that needs mental alertness until you know how this medicine affects you. Do not stand or sit up quickly, especially if you are an older patient. This reduces the risk of dizzy or fainting spells. Alcohol may interfere with the effect of this medicine. Avoid alcoholic drinks. There are different types of narcotic medicines (opiates) for pain. If you take more than one type at the same time, you may have more side effects. Give your health care provider a list of all medicines you use. Your doctor will tell you how much medicine to take. Do not take more medicine than directed. Call emergency for help if you have problems breathing. The medicine will cause constipation. Try to have a bowel movement at least every 2 to 3 days. If you do not have a bowel movement for 3 days, call your doctor or health care professional. Do not take Tylenol (acetaminophen) or medicines that have acetaminophen with this medicine. Too much acetaminophen can be very dangerous. Many nonprescription medicines contain acetaminophen. Always read the labels carefully to avoid taking more acetaminophen. What side effects may I notice from receiving this medicine? Side effects that you should report to your doctor or health care professional as soon as possible: -allergic reactions like skin rash, itching or hives, swelling of the face, lips, or tongue -breathing difficulties, wheezing -confusion -light headedness or fainting spells -severe stomach pain -unusually weak or tired -yellowing of the skin or the whites of the eyes Side effects that usually do not require medical attention (report to your doctor or  health care professional if they continue or are bothersome): -dizziness -drowsiness -nausea -vomiting This list may not describe all possible side effects. Call your doctor for medical advice about side effects. You may report side effects to FDA at 1-800-FDA-1088. Where should I keep my medicine? Keep out of the reach of children. This medicine can be abused. Keep your medicine in a safe place to protect it from theft. Do not share this medicine with anyone. Selling or giving away this medicine is dangerous and against the law. Store at room temperature between 20 and 25 degrees C (68 and 77 degrees F). Keep container tightly closed. Protect from light. This medicine may cause accidental overdose and death if it is taken by other adults, children, or pets. Flush any unused medicine down the toilet to reduce the chance of harm. Do not use the medicine after the expiration date. NOTE: This sheet is a summary. It may not cover all possible information. If you have questions about this medicine, talk to your doctor, pharmacist, or health care provider.  2015, Elsevier/Gold Standard. (2012-09-09 13:17:35)  Cyclobenzaprine tablets What is this medicine? CYCLOBENZAPRINE (sye kloe BEN za preen) is a muscle relaxer. It is used to treat muscle pain, spasms, and stiffness. This medicine may be used for other purposes; ask your health care provider or pharmacist if you have questions. COMMON BRAND NAME(S): Fexmid, Flexeril What should I tell my health care provider before I take this medicine? They need to know if you have any of these conditions: -heart disease, irregular heartbeat, or previous heart attack -liver disease -thyroid problem -an unusual or allergic reaction to cyclobenzaprine, tricyclic antidepressants, lactose, other  medicines, foods, dyes, or preservatives -pregnant or trying to get pregnant -breast-feeding How should I use this medicine? Take this medicine by mouth with a glass of  water. Follow the directions on the prescription label. If this medicine upsets your stomach, take it with food or milk. Take your medicine at regular intervals. Do not take it more often than directed. Talk to your pediatrician regarding the use of this medicine in children. Special care may be needed. Overdosage: If you think you have taken too much of this medicine contact a poison control center or emergency room at once. NOTE: This medicine is only for you. Do not share this medicine with others. What if I miss a dose? If you miss a dose, take it as soon as you can. If it is almost time for your next dose, take only that dose. Do not take double or extra doses. What may interact with this medicine? Do not take this medicine with any of the following medications: -certain medicines for fungal infections like fluconazole, itraconazole, ketoconazole, posaconazole, voriconazole -cisapride -dofetilide -dronedarone -droperidol -flecainide -grepafloxacin -halofantrine -levomethadyl -MAOIs like Carbex, Eldepryl, Marplan, Nardil, and Parnate -nilotinib -pimozide -probucol -sertindole -thioridazine -ziprasidone This medicine may also interact with the following medications: -abarelix -alcohol -certain medicines for cancer -certain medicines for depression, anxiety, or psychotic disturbances -certain medicines for infection like alfuzosin, chloroquine, clarithromycin, levofloxacin, mefloquine, pentamidine, troleandomycin -certain medicines for an irregular heart beat -certain medicines used for sleep or numbness during surgery or procedure -contrast dyes -dolasetron -guanethidine -methadone -octreotide -ondansetron -other medicines that prolong the QT interval (cause an abnormal heart rhythm) -palonosetron -phenothiazines like chlorpromazine, mesoridazine, prochlorperazine, thioridazine -tramadol -vardenafil This list may not describe all possible interactions. Give your health  care provider a list of all the medicines, herbs, non-prescription drugs, or dietary supplements you use. Also tell them if you smoke, drink alcohol, or use illegal drugs. Some items may interact with your medicine. What should I watch for while using this medicine? Check with your doctor or health care professional if your condition does not improve within 1 to 3 weeks. You may get drowsy or dizzy when you first start taking the medicine or change doses. Do not drive, use machinery, or do anything that may be dangerous until you know how the medicine affects you. Stand or sit up slowly. Your mouth may get dry. Drinking water, chewing sugarless gum, or sucking on hard candy may help. What side effects may I notice from receiving this medicine? Side effects that you should report to your doctor or health care professional as soon as possible: -allergic reactions like skin rash, itching or hives, swelling of the face, lips, or tongue -chest pain -fast heartbeat -hallucinations -seizures -vomiting Side effects that usually do not require medical attention (report to your doctor or health care professional if they continue or are bothersome): -headache This list may not describe all possible side effects. Call your doctor for medical advice about side effects. You may report side effects to FDA at 1-800-FDA-1088. Where should I keep my medicine? Keep out of the reach of children. Store at room temperature between 15 and 30 degrees C (59 and 86 degrees F). Keep container tightly closed. Throw away any unused medicine after the expiration date. NOTE: This sheet is a summary. It may not cover all possible information. If you have questions about this medicine, talk to your doctor, pharmacist, or health care provider.  2015, [employer]vier/Gold Standard. (2012-08-13 12:48:19)

## 2013-09-25 NOTE — Telephone Encounter (Signed)
She needs to proceed with the [facility] we prescribed and minimize potassium in diet, recheck renal ASAP tonight or in am so I can adjust again, order the renal panel as urgent

## 2013-09-25 NOTE — Telephone Encounter (Signed)
Patient states that she was recently in ED and was told to come back tomorrow to recheck potassium. We do not have anything at all for 30 min, when should I get patient in and with whom

## 2013-09-26 ENCOUNTER — Telehealth: Payer: Self-pay | Admitting: Nurse Practitioner

## 2013-09-26 ENCOUNTER — Encounter: Payer: Self-pay | Admitting: Nurse Practitioner

## 2013-09-26 ENCOUNTER — Ambulatory Visit (INDEPENDENT_AMBULATORY_CARE_PROVIDER_SITE_OTHER): Payer: Medicare Other | Admitting: Nurse Practitioner

## 2013-09-26 VITALS — BP 122/62 | HR 75 | Temp 98.4°F | Resp 18 | Ht 63.0 in | Wt 195.0 lb

## 2013-09-26 DIAGNOSIS — E875 Hyperkalemia: Secondary | ICD-10-CM | POA: Insufficient documentation

## 2013-09-26 DIAGNOSIS — R946 Abnormal results of thyroid function studies: Secondary | ICD-10-CM

## 2013-09-26 DIAGNOSIS — R7989 Other specified abnormal findings of blood chemistry: Secondary | ICD-10-CM | POA: Insufficient documentation

## 2013-09-26 LAB — RENAL FUNCTION PANEL
Albumin: 3.4 g/dL — ABNORMAL LOW (ref 3.5–5.2)
BUN: 26 mg/dL — ABNORMAL HIGH (ref 6–23)
CALCIUM: 8.7 mg/dL (ref 8.4–10.5)
CHLORIDE: 103 meq/L (ref 96–112)
CO2: 21 mEq/L (ref 19–32)
Creat: 1.2 mg/dL — ABNORMAL HIGH (ref 0.50–1.10)
Glucose, Bld: 174 mg/dL — ABNORMAL HIGH (ref 70–99)
PHOSPHORUS: 4.5 mg/dL (ref 2.3–4.6)
POTASSIUM: 5.2 meq/L (ref 3.5–5.3)
SODIUM: 135 meq/L (ref 135–145)

## 2013-09-26 LAB — TSH: TSH: 0.038 u[IU]/mL — AB (ref 0.350–4.500)

## 2013-09-26 MED ORDER — SODIUM POLYSTYRENE SULFONATE 15 GM/60ML PO SUSP: 15.0000 g | Freq: Once | ORAL | Status: DC

## 2013-09-26 NOTE — Patient Instructions (Addendum)
Leg pain is likely due to elevated potassium.  I will call you with lab results & further instructions.  If potassium is still high, you will take kayexelate & increase microzide.  Hold thyroid medicine until further notice.   We will likely check labs again next week.  If you have palpitations, chest pain, or leg cramps get worse over weekend, please go to ER.  Pleasure to meet you!

## 2013-09-26 NOTE — Telephone Encounter (Signed)
There should be a long phone note about this I really need to make sure she gets the [facility] now. This was supposed to be sent in after I saw her labs work I would like to increase to 30 mg po bid while we wait on her repeat labs I also want her to double up her HCTZ to 25 mg daily

## 2013-09-26 NOTE — Progress Notes (Signed)
Pre visit review using our clinic review tool, if applicable. No additional management support is needed unless otherwise documented below in the visit note. 

## 2013-09-26 NOTE — Telephone Encounter (Signed)
See if she can go to [facility]

## 2013-09-26 NOTE — Progress Notes (Signed)
Subjective:     Brittney Cunningham is a 73 y.o. female presents for follow up of elevated potassium. She is accompanied by her husband & ambulates w/cane. She began having diarrhea about 10 days ago. Went to UC & was treated for UTI w/septra. 5 da she f/u w/PCP. Her potassium was elevated. Pt was instructed to begin kayexelate & increase dose of HCTZ per PCP note. Pt did not understand instructions & did not fill medication or increase HCTZ. She began having severe leg cramps 2 da. Her son took her to ER. Her potassium was 6.3. She was given kayexelate & instructed to stop losartan & f/u w/PCP for potassium check. Today, she reports leg cramps are better, but not resolved. She had a few palpitations this morning & took a NTG tab. She had no accompanying symptoms. Palpitations resolved within a few minutes. Of note, her TSH is low, T4 is elevated. She is taking 200 mcg levothyroxine qd & additional 75 mcg on M&F. She was recently referred to endo for eval. 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   The following portions of the patient's history were reviewed and updated as appropriate: allergies, current medications, past medical history, past social history, past surgical history and problem list.  Review of Systems Pertinent items are noted in HPI.    Objective:    BP 122/62  Pulse 75  Temp(Src) 98.4 F (36.9 C) (Oral)  Resp 18  Ht 5\' 3"  (1.6 m)  Wt 195 lb (88.451 kg)  BMI 34.55 kg/m2  SpO2 97%  LMP 03/13/2012 BP 122/62  Pulse 75  Temp(Src) 98.4 F (36.9 C) (Oral)  Resp 18  Ht 5\' 3"  (1.6 m)  Wt 195 lb (88.451 kg)  BMI 34.55 kg/m2  SpO2 97%  LMP 03/13/2012 General appearance: alert, cooperative, appears stated age and no distress Head: Normocephalic, without obvious abnormality, atraumatic Lungs: clear to auscultation bilaterally Heart: regular rate and rhythm, S1, S2 normal, no murmur, click, rub or gallop Extremities: extremities normal,  atraumatic, no cyanosis or edema Pulses: 2+ and symmetric    Assessment:  1. Hyperkalemia - Renal function panel  2. Low TSH level - TSH  See problem list for complete A&P See pt instructions. F/u next week.

## 2013-09-26 NOTE — Telephone Encounter (Signed)
We are full for this morning. When should patient come in?

## 2013-09-26 NOTE — Telephone Encounter (Signed)
Appointment scheduled this afternoon with Brittney Cunningham at [facility]

## 2013-09-26 NOTE — Assessment & Plan Note (Addendum)
Elevated potassium after being treated for UTI at [facility]. Pt treated with septra.  She had 1 dose kayexelate in ER. 11 bm in 24 hrs. Leg cramps improved, few palpitations this am. Renal panel today. Remain off losartan. No NSAIDS.  Hols kayex until labs back. This may be med SE-losartan, septra & NSAID with background if mild CKD.

## 2013-09-26 NOTE — Assessment & Plan Note (Addendum)
Reports palpitations this am, but potassium has been high. Pt taking 200 mcg synthroid qd & additional 75 mg on M&F. Adv pt to hold synthroid over weekend.  TSH today. Pt has appt pending w/Dr ellison in 2 weeks. F/u next week.

## 2013-09-26 NOTE — Telephone Encounter (Signed)
K+ has normalized after 1 dose kayexelate in ER 2 days ago. She had 11 stools from 1 dose kayex. Hold further doses.  TSH too low. Pt is taking 200 mcg qd & additional 75 mg on M & F.  Instructed no NSAIDS, losartan. Hold synthroid over weekend & until further advised.  Adv to go to ER if palpitations or leg cramps become severe again.  Pt will f/u next week.

## 2013-09-26 NOTE — Telephone Encounter (Signed)
I called and left a very detailed message on patients home phone and tried to call patient on her cell phone. Apologizing for this confusion and the medication not being sent in. I informed patient that we would be at our office for another 20 minutes if she had any questions and/or she could address with Layne at 1 pm today.  I stated on patients message to still go in to see Layne today, increase the HCTZ to 25 mg and I sent in [facility] and to please start now.  This information was given printed and given to [place].

## 2013-09-26 NOTE — Telephone Encounter (Signed)
Pt stated that "she's not taking the [facility] and she has no idea of what this medication is. She wants to come in this a.m. To recheck renal panel because she can't come in the afternoon." LDM

## 2013-09-26 NOTE — Addendum Note (Signed)
Addended by: Varney Daily on: 09/26/2013 11:56 AM   Modules accepted: Orders

## 2013-09-28 ENCOUNTER — Encounter: Payer: Self-pay | Admitting: Family Medicine

## 2013-09-28 DIAGNOSIS — N390 Urinary tract infection, site not specified: Secondary | ICD-10-CM | POA: Insufficient documentation

## 2013-09-28 DIAGNOSIS — R197 Diarrhea, unspecified: Secondary | ICD-10-CM

## 2013-09-28 DIAGNOSIS — R112 Nausea with vomiting, unspecified: Secondary | ICD-10-CM | POA: Insufficient documentation

## 2013-09-28 HISTORY — DX: Diarrhea, unspecified: R19.7

## 2013-09-28 NOTE — Assessment & Plan Note (Addendum)
Is improving, minimize fatty, acidic foods, start probiotics report worsening symptoms

## 2013-09-28 NOTE — Assessment & Plan Note (Signed)
Minimize spicy, fatty foods, eat small, frequent meals, use ginger prn, consider Ondansetron prn

## 2013-09-28 NOTE — Progress Notes (Signed)
Patient ID: Brittney Cunningham, female   DOB: 1940-04-30, 73 y.o.   MRN: [id number] Brittney Cunningham [id number] 05-28-1940 09/28/2013      Progress Note-Follow Up  Subjective  Chief Complaint  Chief Complaint  Patient presents with  . Diarrhea    X 2 sundays ago  . Nausea    X 2 sundays ago    HPI  Patient is a 73 year old female in today for routine medical care. Struggling with UTI with urinary symptoms improved but she has had some  Trouble with palpitations, muscle cramps. She has been having episodes of nausea and infrequent vomiting. No other signs of acute illness. Denies CP/palp/SOB/HA/congestion/fevers/GU c/o. Taking meds as prescribed  Past Medical History  Diagnosis Date  . Anemia   . Depression   . Diabetes mellitus type II   . GERD (gastroesophageal reflux disease)   . Hyperlipidemia   . Hypothyroidism   . Osteoarthritis   . Carotid artery stenosis     bilateral  . CAD (coronary artery disease)     medical therapy, old tot RCA, patent LAD, mod. severe ostial LCX stenosis  . OSA (obstructive sleep apnea)   . Breast cancer     right breast cancer-invasive  ductal carcinoma (StageIV)  . Hypertension   . Anxiety   . Carotid artery occlusion   . Unspecified constipation 03/15/2012  . Bursitis   . Collagen vascular disease   . History of radiation therapy 10/31/12-11/13/12    rt&lt humerous 30Gy/51fx  . Valvular heart disease 07/11/2013    Past Surgical History  Procedure Laterality Date  . Incisional breast biopsy      remoted left breast biopsy  . Cesarean section    . Other surgical history      GYN surgery  . Knee surgery      right knee surgery  . Carotid endarterectomy      right carotid  . Port-a-cath removal    . Modified mastectomy      Right breast  . Breast surgery    . Mastectomy    . Carotid stent      left  . Cardiac catheterization  06/2013    old total occ. of RCA, moderately severe ostial LCX stenosis- medical therapy- would be complex  PCI    Family History  Problem Relation Age of Onset  . Arthritis    . Coronary artery disease      first degree relative  . Stroke      first degree relative  . Diabetes Mother   . Heart disease Mother   . Hypertension Mother   . Cancer Mother   . Deep vein thrombosis Mother   . Colon cancer Neg Hx   . Stomach cancer Neg Hx   . Cancer Father     Pancreatic cancer  . Breast cancer Paternal Aunt 74    History   Social History  . Marital Status: Married    Spouse Name: N/A    Number of Children: 3  . Years of Education: N/A   Occupational History  . 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 Smoking status: Former Smoker -- 1.00 packs/day for 20 years    Types: Cigarettes    Quit date: 01/31/1988  . Smokeless tobacco: Never Used  . Alcohol Use: No  . Drug Use: No  . Sexual Activity: Not Currently    Birth Control/ Protection: Post-menopausal   Other  Topics Concern  . Not on file   Social History Narrative   Retired-ran a daycare facility for 40 years.   Married- 43 years    2 sons    1 daughter - Engineer, civil (consulting) midwife   Former smoker-quit in [place].     Current Outpatient Prescriptions on File Prior to Visit  Medication Sig Dispense Refill  . aspirin 81 MG chewable tablet Chew 81 mg by mouth daily.      Marland Kitchen atorvastatin (LIPITOR) 80 MG tablet Take 1 tablet (80 mg total) by mouth daily.  30 tablet  6  . Blood Glucose Monitoring Suppl (ACCU-CHEK NANO SMARTVIEW) W/DEVICE KIT Use to check blood sugar twice a day. DX 250.00  1 kit  0  . carvedilol (COREG) 25 MG tablet Take 1 tablet (25 mg total) by mouth 2 (two) times daily with a meal.  60 tablet  6  . clopidogrel (PLAVIX) 75 MG tablet Take 1 tablet (75 mg total) by mouth daily.  30 tablet  8  . cyanocobalamin (,VITAMIN B-12,) 1000 MCG/ML injection Inject 1,000 mcg into the muscle every 30 (thirty) days. Last dose 03/09/2011      . darbepoetin (ARANESP) 200 MCG/0.4ML SOLN Inject 200 mcg into the skin  once. Every 30 days      . ferrous fumarate (HEMOCYTE - 106 MG FE) 325 (106 FE) MG TABS tablet Take 1 tablet (106 mg of iron total) by mouth daily.  30 each  3  . glucose blood (ACCU-CHEK AVIVA) test strip Use as instructed to check blood sugar twice a day  Dx 250.00  100 each  3  . glycerin adult (GLYCERIN ADULT) 2 G SUPP Place 1 suppository rectally once as needed (constipation).  10 suppository  0  . hydrochlorothiazide (MICROZIDE) 12.5 MG capsule Take 1 capsule (12.5 mg total) by mouth daily.  30 capsule  3  . insulin glargine (LANTUS) 100 UNIT/ML injection Inject 0.6 mLs (60 Units total) into the skin at bedtime. Pt wants vials.  30 mL  1  . isosorbide mononitrate (IMDUR) 60 MG 24 hr tablet Take 2 tablets (120 mg total) by mouth daily.  60 tablet  11  . levothyroxine (SYNTHROID, LEVOTHROID) 200 MCG tablet Take 1 tablet (200 mcg total) by mouth daily before breakfast.  30 tablet  3  . levothyroxine (SYNTHROID, LEVOTHROID) 75 MCG tablet MOn  & Fridays with the daily      . metFORMIN (GLUCOPHAGE) 500 MG tablet Take 1 tablet (500 mg total) by mouth every morning.  30 tablet  5  . morphine (MS CONTIN) 30 MG 12 hr tablet Take 1 tablet (30 mg total) by mouth 2 (two) times daily.  60 tablet  0  . nitroGLYCERIN (NITROSTAT) 0.4 MG SL tablet Place 1 tablet (0.4 mg total) under the tongue every 5 (five) minutes as needed for chest pain.  30 tablet  0  . polyethylene glycol (MIRALAX / GLYCOLAX) packet Take 17 g by mouth daily as needed.      . [DISCONTINUED] simvastatin (ZOCOR) 40 MG tablet Take 40 mg by mouth every evening.       No current facility-administered medications on file prior to visit.    Allergies  Allergen Reactions  . Chlorhexidine Hives, Itching and Rash    This was most likely a CONTACT DERMATITIS versus true systemic allergic reaction  . Adhesive [Tape] Hives  . Codeine Swelling    Review of Systems  Review of Systems  Constitutional: Negative for fever  and  malaise/fatigue.  HENT: Negative for congestion.   Eyes: Negative for discharge.  Respiratory: Negative for shortness of breath.   Cardiovascular: Positive for palpitations. Negative for chest pain and leg swelling.  Gastrointestinal: Positive for nausea and vomiting. Negative for abdominal pain and diarrhea.  Genitourinary: Negative for dysuria.  Musculoskeletal: Positive for myalgias. Negative for falls.  Skin: Negative for rash.  Neurological: Negative for loss of consciousness and headaches.  Endo/Heme/Allergies: Negative for polydipsia.  Psychiatric/Behavioral: Negative for depression and suicidal ideas. The patient is not nervous/anxious and does not have insomnia.     Objective  BP 130/82  Pulse 72  Temp(Src) 98.5 F (36.9 C) (Oral)  Ht $R'5\' 3"'KK$  (1.6 m)  Wt 194 lb 1.3 oz (88.034 kg)  BMI 34.39 kg/m2  SpO2 97%  LMP 03/13/2012  Physical Exam  Physical Exam  Constitutional: She is oriented to person, place, and time and well-developed, well-nourished, and in no distress. No distress.  HENT:  Head: Normocephalic and atraumatic.  Eyes: Conjunctivae are normal.  Neck: Neck supple. No thyromegaly present.  Cardiovascular: Normal rate, regular rhythm and normal heart sounds.   No murmur heard. Pulmonary/Chest: Effort normal and breath sounds normal. She has no wheezes.  Abdominal: She exhibits no distension and no mass.  Musculoskeletal: She exhibits no edema.  Lymphadenopathy:    She has no cervical adenopathy.  Neurological: She is alert and oriented to person, place, and time.  Skin: Skin is warm and dry. No rash noted. She is not diaphoretic.  Psychiatric: Memory, affect and judgment normal.    Lab Results  Component Value Date   TSH 0.038* 09/26/2013   Lab Results  Component Value Date   WBC 7.4 09/25/2013   HGB 9.1* 09/25/2013   HCT 29.5* 09/25/2013   MCV 94.9 09/25/2013   PLT 259 09/25/2013   Lab Results  Component Value Date   CREATININE 1.20* 09/26/2013    BUN 26* 09/26/2013   NA 135 09/26/2013   K 5.2 09/26/2013   CL 103 09/26/2013   CO2 21 09/26/2013   Lab Results  Component Value Date   ALT 14 09/23/2013   AST 25 09/23/2013   ALKPHOS 109 09/23/2013   BILITOT 0.4 09/23/2013   Lab Results  Component Value Date   CHOL 130 07/03/2013   Lab Results  Component Value Date   HDL 22* 07/03/2013   Lab Results  Component Value Date   LDLCALC 80 07/03/2013   Lab Results  Component Value Date   TRIG 142 07/03/2013   Lab Results  Component Value Date   CHOLHDL 5.9 07/03/2013     Assessment  Plan  Low TSH level Levothyroxine dropped to 75 mcg po daily  Hyperkalemia Will continue to monitor  Urinary tract infection, site not specified Symptoms resolved with antibiotics, Bactrim  Nausea with vomiting Minimize spicy, fatty foods, eat small, frequent meals, use ginger prn, consider Ondansetron prn  Diarrhea Is improving, minimize fatty, acidic foods, start probiotics report worsening symptoms

## 2013-09-28 NOTE — Assessment & Plan Note (Signed)
Levothyroxine dropped to 75 mcg po daily

## 2013-09-28 NOTE — Assessment & Plan Note (Addendum)
Symptoms resolved with antibiotics, Bactrim

## 2013-09-28 NOTE — Assessment & Plan Note (Signed)
Will continue to monitor.

## 2013-09-29 ENCOUNTER — Telehealth: Payer: Self-pay | Admitting: Nurse Practitioner

## 2013-09-29 DIAGNOSIS — E875 Hyperkalemia: Secondary | ICD-10-CM

## 2013-09-29 DIAGNOSIS — E039 Hypothyroidism, unspecified: Secondary | ICD-10-CM

## 2013-09-29 MED ORDER — LEVOTHYROXINE SODIUM 75 MCG PO TABS: 75.0000 ug | ORAL_TABLET | Freq: Every day | ORAL | Status: DC

## 2013-09-29 NOTE — Telephone Encounter (Signed)
pls call pt: Advise Start levothyroxine 75 mcg daily. Do not take 200 mcg. Will check thyroid function in 6 weeks-pls sched. Lab appt., order placed.  Make appt. For lab only, today or tomorrow for bmet.

## 2013-09-29 NOTE — Telephone Encounter (Signed)
Spoke with pt, advised message from [place]. Pt understood and will come in tomorrow for blood draw.

## 2013-09-30 ENCOUNTER — Other Ambulatory Visit (INDEPENDENT_AMBULATORY_CARE_PROVIDER_SITE_OTHER): Payer: Medicare Other

## 2013-09-30 DIAGNOSIS — E039 Hypothyroidism, unspecified: Secondary | ICD-10-CM

## 2013-09-30 DIAGNOSIS — E875 Hyperkalemia: Secondary | ICD-10-CM

## 2013-09-30 LAB — BASIC METABOLIC PANEL
BUN: 25 mg/dL — ABNORMAL HIGH (ref 6–23)
CHLORIDE: 105 meq/L (ref 96–112)
CO2: 24 mEq/L (ref 19–32)
Calcium: 9.3 mg/dL (ref 8.4–10.5)
Creatinine, Ser: 0.9 mg/dL (ref 0.4–1.2)
GFR: 65.59 mL/min (ref >60.00)
Glucose, Bld: 136 mg/dL — ABNORMAL HIGH (ref 70–99)
POTASSIUM: 4.6 meq/L (ref 3.5–5.1)
SODIUM: 138 meq/L (ref 135–145)

## 2013-10-01 ENCOUNTER — Telehealth: Payer: Self-pay | Admitting: Nurse Practitioner

## 2013-10-01 LAB — TSH: TSH: 0.1 u[IU]/mL — ABNORMAL LOW (ref 0.35–4.50)

## 2013-10-01 NOTE — Telephone Encounter (Signed)
Spoke with pt, advised lab results, pt understood.

## 2013-10-01 NOTE — Telephone Encounter (Signed)
pls call pt: Advise POtassium back to normal.  Hold levothyroxine for another 5 days, then start 75 mcg daily. It's OK if she took it today already.

## 2013-10-02 ENCOUNTER — Ambulatory Visit: Payer: Self-pay | Admitting: Family Medicine

## 2013-10-13 ENCOUNTER — Ambulatory Visit: Payer: Self-pay | Admitting: Endocrinology

## 2013-10-14 ENCOUNTER — Ambulatory Visit: Payer: Self-pay | Admitting: Family Medicine

## 2013-10-14 ENCOUNTER — Other Ambulatory Visit: Payer: Self-pay

## 2013-10-14 MED ORDER — METFORMIN HCL 500 MG PO TABS: 500.0000 mg | ORAL_TABLET | Freq: Every morning | ORAL | Status: DC

## 2013-10-16 ENCOUNTER — Other Ambulatory Visit: Payer: Self-pay | Admitting: Emergency Medicine

## 2013-10-16 DIAGNOSIS — C7951 Secondary malignant neoplasm of bone: Principal | ICD-10-CM

## 2013-10-16 DIAGNOSIS — C50911 Malignant neoplasm of unspecified site of right female breast: Secondary | ICD-10-CM

## 2013-10-16 MED ORDER — MORPHINE SULFATE ER 30 MG PO TBCR: 30.0000 mg | EXTENDED_RELEASE_TABLET | Freq: Two times a day (BID) | ORAL | Status: DC

## 2013-10-19 ENCOUNTER — Emergency Department (HOSPITAL_BASED_OUTPATIENT_CLINIC_OR_DEPARTMENT_OTHER)
Admission: EM | Admit: 2013-10-19 | Discharge: 2013-10-19 | Disposition: A | Discharge status: Home / Self Care | Payer: Medicare Other | Attending: Emergency Medicine | Admitting: Emergency Medicine

## 2013-10-19 ENCOUNTER — Encounter (HOSPITAL_BASED_OUTPATIENT_CLINIC_OR_DEPARTMENT_OTHER): Payer: Self-pay | Admitting: Emergency Medicine

## 2013-10-19 ENCOUNTER — Emergency Department (HOSPITAL_BASED_OUTPATIENT_CLINIC_OR_DEPARTMENT_OTHER): Payer: Medicare Other

## 2013-10-19 DIAGNOSIS — D649 Anemia, unspecified: Secondary | ICD-10-CM | POA: Diagnosis not present

## 2013-10-19 DIAGNOSIS — I2510 Atherosclerotic heart disease of native coronary artery without angina pectoris: Secondary | ICD-10-CM | POA: Insufficient documentation

## 2013-10-19 DIAGNOSIS — Z7902 Long term (current) use of antithrombotics/antiplatelets: Secondary | ICD-10-CM | POA: Insufficient documentation

## 2013-10-19 DIAGNOSIS — E785 Hyperlipidemia, unspecified: Secondary | ICD-10-CM | POA: Diagnosis not present

## 2013-10-19 DIAGNOSIS — Z87891 Personal history of nicotine dependence: Secondary | ICD-10-CM | POA: Insufficient documentation

## 2013-10-19 DIAGNOSIS — Z923 Personal history of irradiation: Secondary | ICD-10-CM | POA: Diagnosis not present

## 2013-10-19 DIAGNOSIS — Z79899 Other long term (current) drug therapy: Secondary | ICD-10-CM | POA: Insufficient documentation

## 2013-10-19 DIAGNOSIS — I10 Essential (primary) hypertension: Secondary | ICD-10-CM | POA: Insufficient documentation

## 2013-10-19 DIAGNOSIS — E039 Hypothyroidism, unspecified: Secondary | ICD-10-CM | POA: Insufficient documentation

## 2013-10-19 DIAGNOSIS — Z853 Personal history of malignant neoplasm of breast: Secondary | ICD-10-CM | POA: Diagnosis not present

## 2013-10-19 DIAGNOSIS — M5412 Radiculopathy, cervical region: Secondary | ICD-10-CM | POA: Diagnosis not present

## 2013-10-19 DIAGNOSIS — Z7982 Long term (current) use of aspirin: Secondary | ICD-10-CM | POA: Insufficient documentation

## 2013-10-19 DIAGNOSIS — M1990 Unspecified osteoarthritis, unspecified site: Secondary | ICD-10-CM | POA: Diagnosis not present

## 2013-10-19 DIAGNOSIS — R51 Headache: Secondary | ICD-10-CM | POA: Insufficient documentation

## 2013-10-19 DIAGNOSIS — Z8659 Personal history of other mental and behavioral disorders: Secondary | ICD-10-CM | POA: Diagnosis not present

## 2013-10-19 DIAGNOSIS — Z9889 Other specified postprocedural states: Secondary | ICD-10-CM | POA: Insufficient documentation

## 2013-10-19 DIAGNOSIS — M542 Cervicalgia: Secondary | ICD-10-CM | POA: Insufficient documentation

## 2013-10-19 DIAGNOSIS — Z8719 Personal history of other diseases of the digestive system: Secondary | ICD-10-CM | POA: Insufficient documentation

## 2013-10-19 LAB — COMPREHENSIVE METABOLIC PANEL
ALBUMIN: 3.5 g/dL (ref 3.5–5.2)
ALK PHOS: 146 U/L — AB (ref 39–117)
ALT: 8 U/L (ref 0–35)
AST: 28 U/L (ref 0–37)
Anion gap: 20 — ABNORMAL HIGH (ref 5–15)
BUN: 28 mg/dL — AB (ref 6–23)
CO2: 21 mEq/L (ref 19–32)
CREATININE: 1.4 mg/dL — AB (ref 0.50–1.10)
Calcium: 9.7 mg/dL (ref 8.4–10.5)
Chloride: 94 mEq/L — ABNORMAL LOW (ref 96–112)
GFR calc Af Amer: 43 mL/min — ABNORMAL LOW (ref >90)
GFR, EST NON AFRICAN AMERICAN: 37 mL/min — AB (ref >90)
Glucose, Bld: 149 mg/dL — ABNORMAL HIGH (ref 70–99)
POTASSIUM: 4.9 meq/L (ref 3.7–5.3)
Sodium: 135 mEq/L — ABNORMAL LOW (ref 137–147)
Total Bilirubin: 0.3 mg/dL (ref 0.3–1.2)
Total Protein: 7.8 g/dL (ref 6.0–8.3)

## 2013-10-19 LAB — CBC WITH DIFFERENTIAL/PLATELET
BASOS ABS: 0 10*3/uL (ref 0.0–0.1)
BASOS PCT: 0 % (ref 0–1)
Eosinophils Absolute: 0.2 10*3/uL (ref 0.0–0.7)
Eosinophils Relative: 2 % (ref 0–5)
HEMATOCRIT: 32.6 % — AB (ref 36.0–46.0)
HEMOGLOBIN: 10.3 g/dL — AB (ref 12.0–15.0)
LYMPHS PCT: 35 % (ref 12–46)
Lymphs Abs: 2.7 10*3/uL (ref 0.7–4.0)
MCH: 28.8 pg (ref 26.0–34.0)
MCHC: 31.6 g/dL (ref 30.0–36.0)
MCV: 91.1 fL (ref 78.0–100.0)
MONO ABS: 0.7 10*3/uL (ref 0.1–1.0)
MONOS PCT: 9 % (ref 3–12)
Neutro Abs: 4 10*3/uL (ref 1.7–7.7)
Neutrophils Relative %: 54 % (ref 43–77)
Platelets: 276 10*3/uL (ref 150–400)
RBC: 3.58 MIL/uL — ABNORMAL LOW (ref 3.87–5.11)
RDW: 15.7 % — ABNORMAL HIGH (ref 11.5–15.5)
WBC: 7.5 10*3/uL (ref 4.0–10.5)

## 2013-10-19 MED ORDER — HYDROMORPHONE HCL 1 MG/ML IJ SOLN
0.5000 mg | Freq: Once | INTRAMUSCULAR | Status: DC
  Filled 2013-10-19: qty 1

## 2013-10-19 MED ORDER — HYDROMORPHONE HCL 1 MG/ML IJ SOLN
0.5000 mg | Freq: Once | INTRAMUSCULAR | Status: AC
  Administered 2013-10-19: 0.5 mg via INTRAVENOUS

## 2013-10-19 MED ORDER — HYDROMORPHONE HCL 2 MG PO TABS: 2.0000 mg | ORAL_TABLET | Freq: Four times a day (QID) | ORAL | Status: DC | PRN

## 2013-10-19 NOTE — ED Provider Notes (Signed)
CSN: [id number]     Arrival date & time 10/19/13  0001 History   First MD Initiated Contact with Patient 10/19/13 0034     Chief Complaint  Patient presents with  . Neck Pain     (Consider location/radiation/quality/duration/timing/severity/associated sxs/prior Treatment) HPI Patient presents with one day of left neck pain radiating into the posterior aspect of the left scalp. The pain also shoots into the left shoulder. She has persistent numbness in the left hand which is unchanged. Denies any trauma. She's had no neck stiffness. Denies any fevers or chills. She states she's had received steroid injections in the left side her neck on previous occasions. The pain as a shooting type pain. No focal weakness, new numbness. No bladder incontinence. Past Medical History  Diagnosis Date  . Anemia   . Depression   . Diabetes mellitus type II   . GERD (gastroesophageal reflux disease)   . Hyperlipidemia   . Hypothyroidism   . Osteoarthritis   . Carotid artery stenosis     bilateral  . CAD (coronary artery disease)     medical therapy, old tot RCA, patent LAD, mod. severe ostial LCX stenosis  . OSA (obstructive sleep apnea)   . Breast cancer     right breast cancer-invasive  ductal carcinoma (StageIV)  . Hypertension   . Anxiety   . Carotid artery occlusion   . Unspecified constipation 03/15/2012  . Bursitis   . Collagen vascular disease   . History of radiation therapy 10/31/12-11/13/12    rt&lt humerous 30Gy/88f  . Valvular heart disease 07/11/2013  . Diarrhea 09/28/2013   Past Surgical History  Procedure Laterality Date  . Incisional breast biopsy      remoted left breast biopsy  . Cesarean section    . Other surgical history      GYN surgery  . Knee surgery      right knee surgery  . Carotid endarterectomy      right carotid  . Port-a-cath removal    . Modified mastectomy      Right breast  . Breast surgery    . Mastectomy    . Carotid stent      left  . Cardiac  catheterization  06/2013    old total occ. of RCA, moderately severe ostial LCX stenosis- medical therapy- would be complex PCI   Family History  Problem Relation Age of Onset  . Arthritis    . Coronary artery disease      first degree relative  . Stroke      first degree relative  . Diabetes Mother   . Heart disease Mother   . Hypertension Mother   . Cancer Mother   . Deep vein thrombosis Mother   . Colon cancer Neg Hx   . Stomach cancer Neg Hx   . Cancer Father     Pancreatic cancer  . Breast cancer Paternal Aunt [id number]  History  Substance Use Topics  . Smoking status: Former Smoker -- 1.00 packs/day for 20 years    Types: Cigarettes    Quit date: 01/31/1988  . Smokeless tobacco: Never Used  . Alcohol Use: No   OB History   Grav Para Term Preterm Abortions TAB SAB Ect Mult Living                 Review of Systems  Constitutional: Negative for fever and chills.  Respiratory: Negative for cough and shortness of breath.   Cardiovascular: Negative for chest pain.  Gastrointestinal: Negative for nausea, vomiting and abdominal pain.  Genitourinary: Negative for difficulty urinating.  Musculoskeletal: Positive for neck pain. Negative for back pain, myalgias and neck stiffness.  Skin: Negative for rash and wound.  Neurological: Positive for numbness and headaches. Negative for dizziness, weakness and light-headedness.  All other systems reviewed and are negative.     Allergies  Chlorhexidine; Adhesive; and Codeine  Home Medications   Prior to Admission medications   Medication Sig Start Date End Date Taking? Authorizing Provider  aspirin 81 MG chewable tablet Chew 81 mg by mouth daily.    Historical Provider, MD  atorvastatin (LIPITOR) 80 MG tablet Take 1 tablet (80 mg total) by mouth daily. 08/13/13   Peter M Martinique, MD  Blood Glucose Monitoring Suppl (ACCU-CHEK NANO SMARTVIEW) W/DEVICE KIT Use to check blood sugar twice a day. DX 250.00 03/28/13   Mosie Lukes, MD   carvedilol (COREG) 25 MG tablet Take 1 tablet (25 mg total) by mouth 2 (two) times daily with a meal. 08/13/13   Peter M Martinique, MD  clopidogrel (PLAVIX) 75 MG tablet Take 1 tablet (75 mg total) by mouth daily. 03/06/13   Serafina Mitchell, MD  cyanocobalamin (,VITAMIN B-12,) 1000 MCG/ML injection Inject 1,000 mcg into the muscle every 30 (thirty) days. Last dose 03/09/2011    Historical Provider, MD  cyclobenzaprine (FLEXERIL) 10 MG tablet Take 1 tablet (10 mg total) by mouth 3 (three) times daily as needed for muscle spasms. 1/91/47   Delora Fuel, MD  darbepoetin (ARANESP) 200 MCG/0.4ML SOLN Inject 200 mcg into the skin once. Every 30 days    Historical Provider, MD  ferrous fumarate (HEMOCYTE - 106 MG FE) 325 (106 FE) MG TABS tablet Take 1 tablet (106 mg of iron total) by mouth daily. 07/14/13   Mosie Lukes, MD  glucose blood (ACCU-CHEK AVIVA) test strip Use as instructed to check blood sugar twice a day  Dx 250.00 03/28/13   Mosie Lukes, MD  glycerin adult (GLYCERIN ADULT) 2 G SUPP Place 1 suppository rectally once as needed (constipation). 04/13/12   Julianne Rice, MD  hydrochlorothiazide (MICROZIDE) 12.5 MG capsule Take 1 capsule (12.5 mg total) by mouth daily. 07/11/13   Mosie Lukes, MD  HYDROmorphone (DILAUDID) 2 MG tablet Take 1 tablet (2 mg total) by mouth every 6 (six) hours as needed for severe pain. 10/19/13   Julianne Rice, MD  insulin glargine (LANTUS) 100 UNIT/ML injection Inject 0.6 mLs (60 Units total) into the skin at bedtime. Pt wants vials.    Mosie Lukes, MD  isosorbide mononitrate (IMDUR) 60 MG 24 hr tablet Take 2 tablets (120 mg total) by mouth daily. 08/05/13   Cecilie Kicks, NP  levothyroxine (SYNTHROID, LEVOTHROID) 75 MCG tablet Take 1 tablet (75 mcg total) by mouth daily before breakfast. 09/29/13   Irene Pap, NP  metFORMIN (GLUCOPHAGE) 500 MG tablet Take 1 tablet (500 mg total) by mouth every morning. 10/14/13   Mosie Lukes, MD  morphine (MS CONTIN) 30 MG 12 hr  tablet Take 1 tablet (30 mg total) by mouth 2 (two) times daily. 10/16/13   Chauncey Cruel, MD  nitroGLYCERIN (NITROSTAT) 0.4 MG SL tablet Place 1 tablet (0.4 mg total) under the tongue every 5 (five) minutes as needed for chest pain. 07/08/13   Janece Canterbury, MD  ondansetron (ZOFRAN) 4 MG tablet Take 1 tablet (4 mg total) by mouth every 8 (eight) hours as needed for nausea or vomiting. 09/23/13  Mosie Lukes, MD  oxyCODONE-acetaminophen (PERCOCET) 5-325 MG per tablet Take 1 tablet by mouth every 4 (four) hours as needed for moderate pain. 5/40/08   Delora Fuel, MD  polyethylene glycol [facility] Floria Raveling) packet Take 17 g by mouth daily as needed.    Historical Provider, MD  sodium polystyrene (KAYEXALATE) 15 GM/60ML suspension Take 60 mLs (15 g total) by mouth once. 09/26/13   Mosie Lukes, MD   BP 155/59  Pulse 63  Temp(Src) 98.1 F (36.7 C) (Oral)  Resp 22  Ht _0  (1.6 m)  Wt 185 lb (83.915 kg)  BMI 32.78 kg/m2  SpO2 100%  LMP 03/13/2012 Physical Exam  Nursing note and vitals reviewed. Constitutional: She is oriented to person, place, and time. She appears well-developed and well-nourished. No distress.  HENT:  Head: Normocephalic and atraumatic.  Mouth/Throat: Oropharynx is clear and moist. No oropharyngeal exudate.  Eyes: EOM are normal. Pupils are equal, round, and reactive to light.  Neck: Normal range of motion. Neck supple.  Mild left paraspinal cervical tenderness to palpation. Patient has no midline cervical tenderness.  Cardiovascular: Normal rate and regular rhythm.   Pulmonary/Chest: Effort normal and breath sounds normal. No respiratory distress. She has no wheezes. She has no rales. She exhibits no tenderness.  Abdominal: Soft. Bowel sounds are normal. She exhibits no distension and no mass. There is no tenderness. There is no rebound and no guarding.  Musculoskeletal: Normal range of motion. She exhibits no edema and no tenderness.  No calf swelling or  tenderness.  Lymphadenopathy:    She has no cervical adenopathy.  Neurological: She is alert and oriented to person, place, and time.  Decreased sensation in the ulnar aspect of the left hand in the ulnar nerve distribution. 5/5 grip strength bilaterally. Normal shoulder abduction and adduction. 5/5 motor in bilateral lower extremities.  Skin: Skin is warm and dry. No rash noted. No erythema.  Psychiatric: She has a normal mood and affect. Her behavior is normal.    ED Course  Procedures (including critical care time) Labs Review Labs Reviewed  CBC WITH DIFFERENTIAL - Abnormal; Notable for the following:    RBC 3.58 (*)    Hemoglobin 10.3 (*)    HCT 32.6 (*)    RDW 15.7 (*)    All other components within normal limits  COMPREHENSIVE METABOLIC PANEL - Abnormal; Notable for the following:    Sodium 135 (*)    Chloride 94 (*)    Glucose, Bld 149 (*)    BUN 28 (*)    Creatinine, Ser 1.40 (*)    Alkaline Phosphatase 146 (*)    GFR calc non Af Amer 37 (*)    GFR calc Af Amer 43 (*)    Anion gap 20 (*)    All other components within normal limits    Imaging Review Ct Cervical Spine Wo Contrast  10/19/2013   CLINICAL DATA:  Left neck and head pain.  EXAM: CT CERVICAL SPINE WITHOUT CONTRAST  TECHNIQUE: Multidetector CT imaging of the cervical spine was performed without intravenous contrast. Multiplanar CT image reconstructions were also generated.  COMPARISON:  PET/CT performed 10/15/2012  FINDINGS: There is no evidence of fracture or subluxation. Vertebral bodies demonstrate normal height and alignment. Mild disc space narrowing is noted at the lower cervical spine. Prevertebral soft tissues are within normal limits. The underlying assess disease is noted along the cervical spine.  Diffuse heterogeneity is noted throughout the visualized osseous structures, reflecting the patient's  known metastatic breast cancer. Larger areas of lucency in the mandible are relatively stable in appearance.   The thyroid gland is unremarkable in appearance. Mild emphysematous change is noted at the lung apices. A vascular stent is noted along the left internal carotid artery. Scattered postoperative change is noted at the right side of the neck. The visualized portions of the brain are unremarkable in appearance.  There is near complete opacification of the maxillary sinuses.  IMPRESSION: 1. No evidence of fracture or subluxation along the cervical spine. 2. Diffuse heterogeneity throughout the visualized osseous structures, with areas of lucency in the mandible, reflecting the patient's known metastatic breast cancer. This is relatively stable from the prior study. 3. Mild emphysematous change at the lung apices. 4. Near complete opacification of the maxillary sinuses.   Electronically Signed   By: Garald Balding M.D.   On: 10/19/2013 02:22     EKG Interpretation None      MDM   Final diagnoses:  Cervical radiculopathy    CT without acute findings. History consistent with cervical radiculopathy. No weakness area and the patient's pain is controlled with Dilaudid. Will give prescription and advised follow up in neurosurgery for possible steroid injections. Return precautions given.    Julianne Rice, MD 10/19/13 [phone]

## 2013-10-19 NOTE — Discharge Instructions (Signed)

## 2013-10-19 NOTE — ED Notes (Signed)
Pt states that around 2000 she began having L neck/head pain. Had tooth fall out L upper side this past Wednesday. Denies fever, vision changes, n/v/d. States she took Motrin with no relief.

## 2013-10-20 ENCOUNTER — Other Ambulatory Visit: Payer: Self-pay

## 2013-10-20 ENCOUNTER — Ambulatory Visit: Payer: Self-pay

## 2013-10-20 ENCOUNTER — Other Ambulatory Visit: Payer: Self-pay | Admitting: *Deleted

## 2013-10-20 DIAGNOSIS — C50911 Malignant neoplasm of unspecified site of right female breast: Secondary | ICD-10-CM

## 2013-10-20 DIAGNOSIS — C7951 Secondary malignant neoplasm of bone: Principal | ICD-10-CM

## 2013-10-21 ENCOUNTER — Ambulatory Visit (HOSPITAL_BASED_OUTPATIENT_CLINIC_OR_DEPARTMENT_OTHER): Payer: Medicare Other

## 2013-10-21 ENCOUNTER — Other Ambulatory Visit (HOSPITAL_BASED_OUTPATIENT_CLINIC_OR_DEPARTMENT_OTHER): Payer: Medicare Other

## 2013-10-21 VITALS — BP 103/29 | HR 57 | Temp 98.3°F

## 2013-10-21 DIAGNOSIS — Z5111 Encounter for antineoplastic chemotherapy: Secondary | ICD-10-CM

## 2013-10-21 DIAGNOSIS — C7951 Secondary malignant neoplasm of bone: Principal | ICD-10-CM

## 2013-10-21 DIAGNOSIS — N289 Disorder of kidney and ureter, unspecified: Secondary | ICD-10-CM

## 2013-10-21 DIAGNOSIS — Z23 Encounter for immunization: Secondary | ICD-10-CM

## 2013-10-21 DIAGNOSIS — C50619 Malignant neoplasm of axillary tail of unspecified female breast: Secondary | ICD-10-CM

## 2013-10-21 DIAGNOSIS — C7952 Secondary malignant neoplasm of bone marrow: Secondary | ICD-10-CM

## 2013-10-21 DIAGNOSIS — D649 Anemia, unspecified: Secondary | ICD-10-CM

## 2013-10-21 DIAGNOSIS — E538 Deficiency of other specified B group vitamins: Secondary | ICD-10-CM

## 2013-10-21 DIAGNOSIS — D631 Anemia in chronic kidney disease: Secondary | ICD-10-CM

## 2013-10-21 DIAGNOSIS — N189 Chronic kidney disease, unspecified: Principal | ICD-10-CM

## 2013-10-21 DIAGNOSIS — C50911 Malignant neoplasm of unspecified site of right female breast: Secondary | ICD-10-CM

## 2013-10-21 LAB — CBC WITH DIFFERENTIAL/PLATELET
BASO%: 0.7 % (ref 0.0–2.0)
Basophils Absolute: 0.1 10*3/uL (ref 0.0–0.1)
EOS%: 3.3 % (ref 0.0–7.0)
Eosinophils Absolute: 0.3 10*3/uL (ref 0.0–0.5)
HEMATOCRIT: 31.5 % — AB (ref 34.8–46.6)
HGB: 9.9 g/dL — ABNORMAL LOW (ref 11.6–15.9)
LYMPH%: 23.9 % (ref 14.0–49.7)
MCH: 28.3 pg (ref 25.1–34.0)
MCHC: 31.4 g/dL — AB (ref 31.5–36.0)
MCV: 90.1 fL (ref 79.5–101.0)
MONO#: 0.7 10*3/uL (ref 0.1–0.9)
MONO%: 8.9 % (ref 0.0–14.0)
NEUT#: 4.8 10*3/uL (ref 1.5–6.5)
NEUT%: 63.2 % (ref 38.4–76.8)
PLATELETS: 237 10*3/uL (ref 145–400)
RBC: 3.5 10*6/uL — AB (ref 3.70–5.45)
RDW: 16.6 % — ABNORMAL HIGH (ref 11.2–14.5)
WBC: 7.6 10*3/uL (ref 3.9–10.3)
lymph#: 1.8 10*3/uL (ref 0.9–3.3)

## 2013-10-21 LAB — COMPREHENSIVE METABOLIC PANEL (CC13)
ALK PHOS: 129 U/L (ref 40–150)
AST: 24 U/L (ref 5–34)
Albumin: 3.1 g/dL — ABNORMAL LOW (ref 3.5–5.0)
Anion Gap: 10 mEq/L (ref 3–11)
BILIRUBIN TOTAL: 0.39 mg/dL (ref 0.20–1.20)
BUN: 27.9 mg/dL — ABNORMAL HIGH (ref 7.0–26.0)
CO2: 24 mEq/L (ref 22–29)
CREATININE: 1.2 mg/dL — AB (ref 0.6–1.1)
Calcium: 9.3 mg/dL (ref 8.4–10.4)
Chloride: 103 mEq/L (ref 98–109)
Glucose: 67 mg/dl — ABNORMAL LOW (ref 70–140)
Potassium: 4.5 mEq/L (ref 3.5–5.1)
SODIUM: 137 meq/L (ref 136–145)
Total Protein: 7.2 g/dL (ref 6.4–8.3)

## 2013-10-21 MED ORDER — DARBEPOETIN ALFA-POLYSORBATE 200 MCG/0.4ML IJ SOLN
200.0000 ug | Freq: Once | INTRAMUSCULAR | Status: AC
  Administered 2013-10-21: 200 ug via SUBCUTANEOUS
  Filled 2013-10-21: qty 0.4

## 2013-10-21 MED ORDER — INFLUENZA VAC SPLIT QUAD 0.5 ML IM SUSY
0.5000 mL | PREFILLED_SYRINGE | Freq: Once | INTRAMUSCULAR | Status: AC
  Administered 2013-10-21: 0.5 mL via INTRAMUSCULAR
  Filled 2013-10-21: qty 0.5

## 2013-10-21 MED ORDER — CYANOCOBALAMIN 1000 MCG/ML IJ SOLN
1000.0000 ug | Freq: Once | INTRAMUSCULAR | Status: AC
  Administered 2013-10-21: 1000 ug via INTRAMUSCULAR

## 2013-10-21 MED ORDER — FULVESTRANT 250 MG/5ML IM SOLN
500.0000 mg | Freq: Once | INTRAMUSCULAR | Status: AC
  Administered 2013-10-21: 500 mg via INTRAMUSCULAR
  Filled 2013-10-21: qty 10

## 2013-11-04 ENCOUNTER — Telehealth: Payer: Self-pay

## 2013-11-04 NOTE — Telephone Encounter (Signed)
Pt stated that she has not had her eye exam this year, but plans to schedule it after the first of the year.  She plans to go the [facility] in [place], [place].

## 2013-11-17 ENCOUNTER — Ambulatory Visit: Payer: Self-pay

## 2013-11-17 ENCOUNTER — Other Ambulatory Visit: Payer: Self-pay | Admitting: *Deleted

## 2013-11-17 ENCOUNTER — Other Ambulatory Visit: Payer: Self-pay

## 2013-11-17 DIAGNOSIS — C50911 Malignant neoplasm of unspecified site of right female breast: Secondary | ICD-10-CM

## 2013-11-17 DIAGNOSIS — C7951 Secondary malignant neoplasm of bone: Principal | ICD-10-CM

## 2013-11-17 DIAGNOSIS — C50919 Malignant neoplasm of unspecified site of unspecified female breast: Secondary | ICD-10-CM

## 2013-11-17 MED ORDER — MORPHINE SULFATE ER 30 MG PO TBCR: 30.0000 mg | EXTENDED_RELEASE_TABLET | Freq: Two times a day (BID) | ORAL | Status: DC

## 2013-11-18 ENCOUNTER — Ambulatory Visit: Payer: Self-pay

## 2013-11-18 ENCOUNTER — Other Ambulatory Visit: Payer: Self-pay

## 2013-11-20 ENCOUNTER — Telehealth: Payer: Self-pay | Admitting: Oncology

## 2013-11-20 NOTE — Telephone Encounter (Signed)
, °

## 2013-11-21 ENCOUNTER — Ambulatory Visit (HOSPITAL_BASED_OUTPATIENT_CLINIC_OR_DEPARTMENT_OTHER): Payer: Medicare Other

## 2013-11-21 ENCOUNTER — Other Ambulatory Visit (HOSPITAL_BASED_OUTPATIENT_CLINIC_OR_DEPARTMENT_OTHER): Payer: Medicare Other

## 2013-11-21 ENCOUNTER — Other Ambulatory Visit: Payer: Self-pay | Admitting: *Deleted

## 2013-11-21 VITALS — BP 156/50 | HR 79

## 2013-11-21 DIAGNOSIS — C50919 Malignant neoplasm of unspecified site of unspecified female breast: Secondary | ICD-10-CM

## 2013-11-21 DIAGNOSIS — K219 Gastro-esophageal reflux disease without esophagitis: Secondary | ICD-10-CM

## 2013-11-21 DIAGNOSIS — C7951 Secondary malignant neoplasm of bone: Secondary | ICD-10-CM

## 2013-11-21 DIAGNOSIS — C50611 Malignant neoplasm of axillary tail of right female breast: Secondary | ICD-10-CM

## 2013-11-21 DIAGNOSIS — Z5111 Encounter for antineoplastic chemotherapy: Secondary | ICD-10-CM

## 2013-11-21 DIAGNOSIS — N289 Disorder of kidney and ureter, unspecified: Secondary | ICD-10-CM

## 2013-11-21 DIAGNOSIS — D649 Anemia, unspecified: Secondary | ICD-10-CM

## 2013-11-21 DIAGNOSIS — E538 Deficiency of other specified B group vitamins: Secondary | ICD-10-CM

## 2013-11-21 LAB — CBC WITH DIFFERENTIAL/PLATELET
BASO%: 0.3 % (ref 0.0–2.0)
BASOS ABS: 0 10*3/uL (ref 0.0–0.1)
EOS%: 4.5 % (ref 0.0–7.0)
Eosinophils Absolute: 0.3 10*3/uL (ref 0.0–0.5)
HCT: 30.6 % — ABNORMAL LOW (ref 34.8–46.6)
HEMOGLOBIN: 9.4 g/dL — AB (ref 11.6–15.9)
LYMPH%: 30.3 % (ref 14.0–49.7)
MCH: 27.9 pg (ref 25.1–34.0)
MCHC: 30.7 g/dL — ABNORMAL LOW (ref 31.5–36.0)
MCV: 90.8 fL (ref 79.5–101.0)
MONO#: 0.4 10*3/uL (ref 0.1–0.9)
MONO%: 6.4 % (ref 0.0–14.0)
NEUT#: 4 10*3/uL (ref 1.5–6.5)
NEUT%: 58.5 % (ref 38.4–76.8)
Platelets: 301 10*3/uL (ref 145–400)
RBC: 3.37 10*6/uL — ABNORMAL LOW (ref 3.70–5.45)
RDW: 15.6 % — AB (ref 11.2–14.5)
WBC: 6.9 10*3/uL (ref 3.9–10.3)
lymph#: 2.1 10*3/uL (ref 0.9–3.3)

## 2013-11-21 LAB — COMPREHENSIVE METABOLIC PANEL (CC13)
ALT: 9 U/L (ref 0–55)
ANION GAP: 13 meq/L — AB (ref 3–11)
AST: 18 U/L (ref 5–34)
Albumin: 3 g/dL — ABNORMAL LOW (ref 3.5–5.0)
Alkaline Phosphatase: 161 U/L — ABNORMAL HIGH (ref 40–150)
BUN: 21.4 mg/dL (ref 7.0–26.0)
CHLORIDE: 100 meq/L (ref 98–109)
CO2: 22 meq/L (ref 22–29)
CREATININE: 1 mg/dL (ref 0.6–1.1)
Calcium: 9.4 mg/dL (ref 8.4–10.4)
GLUCOSE: 150 mg/dL — AB (ref 70–140)
Potassium: 4.3 mEq/L (ref 3.5–5.1)
Sodium: 135 mEq/L — ABNORMAL LOW (ref 136–145)
Total Bilirubin: 0.32 mg/dL (ref 0.20–1.20)
Total Protein: 7.1 g/dL (ref 6.4–8.3)

## 2013-11-21 MED ORDER — FULVESTRANT 250 MG/5ML IM SOLN
500.0000 mg | Freq: Once | INTRAMUSCULAR | Status: AC
  Administered 2013-11-21: 500 mg via INTRAMUSCULAR
  Filled 2013-11-21: qty 10

## 2013-11-21 MED ORDER — DARBEPOETIN ALFA-POLYSORBATE 200 MCG/0.4ML IJ SOLN
200.0000 ug | Freq: Once | INTRAMUSCULAR | Status: AC
  Administered 2013-11-21: 200 ug via SUBCUTANEOUS
  Filled 2013-11-21: qty 0.4

## 2013-11-21 MED ORDER — CYANOCOBALAMIN 1000 MCG/ML IJ SOLN
1000.0000 ug | Freq: Once | INTRAMUSCULAR | Status: AC
  Administered 2013-11-21: 1000 ug via INTRAMUSCULAR

## 2013-11-21 NOTE — Patient Instructions (Signed)
Darbepoetin Alfa injection What is this medicine? DARBEPOETIN ALFA (dar be POE e tin AL fa) helps your body make more red blood cells. It is used to treat anemia caused by chronic kidney failure and chemotherapy. This medicine may be used for other purposes; ask your health care provider or pharmacist if you have questions. COMMON BRAND NAME(S): Aranesp What should I tell my health care provider before I take this medicine? They need to know if you have any of these conditions: -blood clotting disorders or history of blood clots -cancer patient not on chemotherapy -cystic fibrosis -heart disease, such as angina, heart failure, or a history of a heart attack -hemoglobin level of 12 g/dL or greater -high blood pressure -low levels of folate, iron, or vitamin B12 -seizures -an unusual or allergic reaction to darbepoetin, erythropoietin, albumin, hamster proteins, latex, other medicines, foods, dyes, or preservatives -pregnant or trying to get pregnant -breast-feeding How should I use this medicine? This medicine is for injection into a vein or under the skin. It is usually given by a health care professional in a hospital or clinic setting. If you get this medicine at home, you will be taught how to prepare and give this medicine. Do not shake the solution before you withdraw a dose. Use exactly as directed. Take your medicine at regular intervals. Do not take your medicine more often than directed. It is important that you put your used needles and syringes in a special sharps container. Do not put them in a trash can. If you do not have a sharps container, call your pharmacist or healthcare provider to get one. Talk to your pediatrician regarding the use of this medicine in children. While this medicine may be used in children as young as 1 year for selected conditions, precautions do apply. Overdosage: If you think you have taken too much of this medicine contact a poison control center or  emergency room at once. NOTE: This medicine is only for you. Do not share this medicine with others. What if I miss a dose? If you miss a dose, take it as soon as you can. If it is almost time for your next dose, take only that dose. Do not take double or extra doses. What may interact with this medicine? Do not take this medicine with any of the following medications: -epoetin alfa This list may not describe all possible interactions. Give your health care provider a list of all the medicines, herbs, non-prescription drugs, or dietary supplements you use. Also tell them if you smoke, drink alcohol, or use illegal drugs. Some items may interact with your medicine. What should I watch for while using this medicine? Visit your prescriber or health care professional for regular checks on your progress and for the needed blood tests and blood pressure measurements. It is especially important for the doctor to make sure your hemoglobin level is in the desired range, to limit the risk of potential side effects and to give you the best benefit. Keep all appointments for any recommended tests. Check your blood pressure as directed. Ask your doctor what your blood pressure should be and when you should contact him or her. As your body makes more red blood cells, you may need to take iron, folic acid, or vitamin B supplements. Ask your doctor or health care provider which products are right for you. If you have kidney disease continue dietary restrictions, even though this medication can make you feel better. Talk with your doctor or health   care professional about the foods you eat and the vitamins that you take. What side effects may I notice from receiving this medicine? Side effects that you should report to your doctor or health care professional as soon as possible: -allergic reactions like skin rash, itching or hives, swelling of the face, lips, or tongue -breathing problems -changes in vision -chest  pain -confusion, trouble speaking or understanding -feeling faint or lightheaded, falls -high blood pressure -muscle aches or pains -pain, swelling, warmth in the leg -rapid weight gain -severe headaches -sudden numbness or weakness of the face, arm or leg -trouble walking, dizziness, loss of balance or coordination -seizures (convulsions) -swelling of the ankles, feet, hands -unusually weak or tired Side effects that usually do not require medical attention (report to your doctor or health care professional if they continue or are bothersome): -diarrhea -fever, chills (flu-like symptoms) -headaches -nausea, vomiting -redness, stinging, or swelling at site where injected This list may not describe all possible side effects. Call your doctor for medical advice about side effects. You may report side effects to FDA at 1-800-FDA-1088. Where should I keep my medicine? Keep out of the reach of children. Store in a refrigerator between 2 and 8 degrees C (36 and 46 degrees F). Do not freeze. Do not shake. Throw away any unused portion if using a single-dose vial. Throw away any unused medicine after the expiration date. NOTE: This sheet is a summary. It may not cover all possible information. If you have questions about this medicine, talk to your doctor, pharmacist, or health care provider.  2015, Elsevier/Gold Standard. (2007-12-31 10:23:57) Cyanocobalamin, Vitamin B12 injection What is this medicine? CYANOCOBALAMIN (sye an oh koe BAL a min) is a man made form of vitamin B12. Vitamin B12 is used in the growth of healthy blood cells, nerve cells, and proteins in the body. It also helps with the metabolism of fats and carbohydrates. This medicine is used to treat people who can not absorb vitamin B12. This medicine may be used for other purposes; ask your health care provider or pharmacist if you have questions. COMMON BRAND NAME(S): Cyomin, LA-12, Nutri-Twelve, Primabalt What should I tell  my health care provider before I take this medicine? They need to know if you have any of these conditions: -kidney disease -Leber's disease -megaloblastic anemia -an unusual or allergic reaction to cyanocobalamin, cobalt, other medicines, foods, dyes, or preservatives -pregnant or trying to get pregnant -breast-feeding How should I use this medicine? This medicine is injected into a muscle or deeply under the skin. It is usually given by a health care professional in a clinic or doctor's office. However, your doctor may teach you how to inject yourself. Follow all instructions. Talk to your pediatrician regarding the use of this medicine in children. Special care may be needed. Overdosage: If you think you have taken too much of this medicine contact a poison control center or emergency room at once. NOTE: This medicine is only for you. Do not share this medicine with others. What if I miss a dose? If you are given your dose at a clinic or doctor's office, call to reschedule your appointment. If you give your own injections and you miss a dose, take it as soon as you can. If it is almost time for your next dose, take only that dose. Do not take double or extra doses. What may interact with this medicine? -colchicine -heavy alcohol intake This list may not describe all possible interactions. Give your health care  provider a list of all the medicines, herbs, non-prescription drugs, or dietary supplements you use. Also tell them if you smoke, drink alcohol, or use illegal drugs. Some items may interact with your medicine. What should I watch for while using this medicine? Visit your doctor or health care professional regularly. You may need blood work done while you are taking this medicine. You may need to follow a special diet. Talk to your doctor. Limit your alcohol intake and avoid smoking to get the best benefit. What side effects may I notice from receiving this medicine? Side effects that  you should report to your doctor or health care professional as soon as possible: -allergic reactions like skin rash, itching or hives, swelling of the face, lips, or tongue -blue tint to skin -chest tightness, pain -difficulty breathing, wheezing -dizziness -red, swollen painful area on the leg Side effects that usually do not require medical attention (report to your doctor or health care professional if they continue or are bothersome): -diarrhea -headache This list may not describe all possible side effects. Call your doctor for medical advice about side effects. You may report side effects to FDA at 1-800-FDA-1088. Where should I keep my medicine? Keep out of the reach of children. Store at room temperature between 15 and 30 degrees C (59 and 85 degrees F). Protect from light. Throw away any unused medicine after the expiration date. NOTE: This sheet is a summary. It may not cover all possible information. If you have questions about this medicine, talk to your doctor, pharmacist, or health care provider.  2015, Elsevier/Gold Standard. (2007-04-29 22:10:20) Fulvestrant injection What is this medicine? FULVESTRANT (ful VES trant) blocks the effects of estrogen. It is used to treat breast cancer in women past the age of menopause. This medicine may be used for other purposes; ask your health care provider or pharmacist if you have questions. COMMON BRAND NAME(S): FASLODEX What should I tell my health care provider before I take this medicine? They need to know if you have any of these conditions: -bleeding problems -liver disease -low levels of platelets in the blood -an unusual or allergic reaction to fulvestrant, other medicines, foods, dyes, or preservatives -pregnant or trying to get pregnant -breast-feeding How should I use this medicine? This medicine is for injection into a muscle. It is usually given by a health care professional in a hospital or clinic setting. Talk to  your pediatrician regarding the use of this medicine in children. Special care may be needed. Overdosage: If you think you have taken too much of this medicine contact a poison control center or emergency room at once. NOTE: This medicine is only for you. Do not share this medicine with others. What if I miss a dose? It is important not to miss your dose. Call your doctor or health care professional if you are unable to keep an appointment. What may interact with this medicine? -medicines that treat or prevent blood clots like warfarin, enoxaparin, and dalteparin This list may not describe all possible interactions. Give your health care provider a list of all the medicines, herbs, non-prescription drugs, or dietary supplements you use. Also tell them if you smoke, drink alcohol, or use illegal drugs. Some items may interact with your medicine. What should I watch for while using this medicine? Your condition will be monitored carefully while you are receiving this medicine. You will need important blood work done while you are taking this medicine. Do not become pregnant while taking this medicine.   Women should inform their doctor if they wish to become pregnant or think they might be pregnant. There is a potential for serious side effects to an unborn child. Talk to your health care professional or pharmacist for more information. What side effects may I notice from receiving this medicine? Side effects that you should report to your doctor or health care professional as soon as possible: -allergic reactions like skin rash, itching or hives, swelling of the face, lips, or tongue -feeling faint or lightheaded, falls -fever or flu-like symptoms -sore throat -vaginal bleeding Side effects that usually do not require medical attention (report to your doctor or health care professional if they continue or are bothersome): -aches, pains -constipation or diarrhea -headache -hot flashes -nausea,  vomiting -pain at site where injected -stomach pain This list may not describe all possible side effects. Call your doctor for medical advice about side effects. You may report side effects to FDA at 1-800-FDA-1088. Where should I keep my medicine? This drug is given in a hospital or clinic and will not be stored at home. NOTE: This sheet is a summary. It may not cover all possible information. If you have questions about this medicine, talk to your doctor, pharmacist, or health care provider.  2015, Elsevier/Gold Standard. (2007-05-27 15:39:24)  

## 2013-11-28 ENCOUNTER — Other Ambulatory Visit: Payer: Self-pay | Admitting: *Deleted

## 2013-11-28 DIAGNOSIS — I739 Peripheral vascular disease, unspecified: Secondary | ICD-10-CM

## 2013-11-28 MED ORDER — CLOPIDOGREL BISULFATE 75 MG PO TABS: 75.0000 mg | ORAL_TABLET | Freq: Every day | ORAL | Status: DC

## 2013-11-28 NOTE — Telephone Encounter (Signed)
Fax received from [facility] for Plavix refill

## 2013-12-01 ENCOUNTER — Other Ambulatory Visit (HOSPITAL_COMMUNITY): Payer: Self-pay

## 2013-12-01 ENCOUNTER — Ambulatory Visit: Payer: Self-pay | Admitting: Family

## 2013-12-01 ENCOUNTER — Other Ambulatory Visit: Payer: Self-pay | Admitting: Nurse Practitioner

## 2013-12-02 ENCOUNTER — Other Ambulatory Visit: Payer: Self-pay

## 2013-12-02 MED ORDER — HYDROCHLOROTHIAZIDE 12.5 MG PO CAPS: 12.5000 mg | ORAL_CAPSULE | Freq: Every day | ORAL | Status: DC

## 2013-12-12 ENCOUNTER — Other Ambulatory Visit: Payer: Self-pay | Admitting: *Deleted

## 2013-12-12 DIAGNOSIS — C7951 Secondary malignant neoplasm of bone: Principal | ICD-10-CM

## 2013-12-12 DIAGNOSIS — C50919 Malignant neoplasm of unspecified site of unspecified female breast: Secondary | ICD-10-CM

## 2013-12-15 ENCOUNTER — Ambulatory Visit (HOSPITAL_BASED_OUTPATIENT_CLINIC_OR_DEPARTMENT_OTHER): Payer: Medicare Other

## 2013-12-15 ENCOUNTER — Ambulatory Visit (HOSPITAL_BASED_OUTPATIENT_CLINIC_OR_DEPARTMENT_OTHER): Payer: Medicare Other | Admitting: Oncology

## 2013-12-15 ENCOUNTER — Telehealth: Payer: Self-pay | Admitting: Oncology

## 2013-12-15 ENCOUNTER — Other Ambulatory Visit (HOSPITAL_BASED_OUTPATIENT_CLINIC_OR_DEPARTMENT_OTHER): Payer: Medicare Other

## 2013-12-15 VITALS — BP 130/40 | HR 53 | Temp 97.6°F | Resp 18 | Ht 63.0 in | Wt 189.7 lb

## 2013-12-15 DIAGNOSIS — E538 Deficiency of other specified B group vitamins: Secondary | ICD-10-CM

## 2013-12-15 DIAGNOSIS — C50919 Malignant neoplasm of unspecified site of unspecified female breast: Secondary | ICD-10-CM

## 2013-12-15 DIAGNOSIS — C50611 Malignant neoplasm of axillary tail of right female breast: Secondary | ICD-10-CM

## 2013-12-15 DIAGNOSIS — K219 Gastro-esophageal reflux disease without esophagitis: Secondary | ICD-10-CM

## 2013-12-15 DIAGNOSIS — D649 Anemia, unspecified: Secondary | ICD-10-CM

## 2013-12-15 DIAGNOSIS — C7951 Secondary malignant neoplasm of bone: Principal | ICD-10-CM

## 2013-12-15 DIAGNOSIS — Z170 Estrogen receptor positive status [ER+]: Secondary | ICD-10-CM

## 2013-12-15 DIAGNOSIS — C50911 Malignant neoplasm of unspecified site of right female breast: Secondary | ICD-10-CM

## 2013-12-15 DIAGNOSIS — Z5111 Encounter for antineoplastic chemotherapy: Secondary | ICD-10-CM

## 2013-12-15 DIAGNOSIS — C50811 Malignant neoplasm of overlapping sites of right female breast: Secondary | ICD-10-CM | POA: Insufficient documentation

## 2013-12-15 LAB — CBC WITH DIFFERENTIAL/PLATELET
BASO%: 1.2 % (ref 0.0–2.0)
BASOS ABS: 0.1 10*3/uL (ref 0.0–0.1)
EOS%: 3.5 % (ref 0.0–7.0)
Eosinophils Absolute: 0.2 10*3/uL (ref 0.0–0.5)
HCT: 32.7 % — ABNORMAL LOW (ref 34.8–46.6)
HEMOGLOBIN: 10 g/dL — AB (ref 11.6–15.9)
LYMPH#: 1.6 10*3/uL (ref 0.9–3.3)
LYMPH%: 27.2 % (ref 14.0–49.7)
MCH: 28.3 pg (ref 25.1–34.0)
MCHC: 30.7 g/dL — ABNORMAL LOW (ref 31.5–36.0)
MCV: 92 fL (ref 79.5–101.0)
MONO#: 0.4 10*3/uL (ref 0.1–0.9)
MONO%: 6.6 % (ref 0.0–14.0)
NEUT#: 3.6 10*3/uL (ref 1.5–6.5)
NEUT%: 61.5 % (ref 38.4–76.8)
Platelets: 250 10*3/uL (ref 145–400)
RBC: 3.56 10*6/uL — ABNORMAL LOW (ref 3.70–5.45)
RDW: 17.6 % — AB (ref 11.2–14.5)
WBC: 5.8 10*3/uL (ref 3.9–10.3)

## 2013-12-15 LAB — COMPREHENSIVE METABOLIC PANEL (CC13)
ALBUMIN: 3.4 g/dL — AB (ref 3.5–5.0)
ALT: 11 U/L (ref 0–55)
ANION GAP: 7 meq/L (ref 3–11)
AST: 19 U/L (ref 5–34)
Alkaline Phosphatase: 165 U/L — ABNORMAL HIGH (ref 40–150)
BUN: 21.3 mg/dL (ref 7.0–26.0)
CALCIUM: 9.4 mg/dL (ref 8.4–10.4)
CHLORIDE: 105 meq/L (ref 98–109)
CO2: 23 meq/L (ref 22–29)
CREATININE: 1.1 mg/dL (ref 0.6–1.1)
Glucose: 150 mg/dl — ABNORMAL HIGH (ref 70–140)
POTASSIUM: 4.9 meq/L (ref 3.5–5.1)
Sodium: 135 mEq/L — ABNORMAL LOW (ref 136–145)
Total Bilirubin: 0.56 mg/dL (ref 0.20–1.20)
Total Protein: 7 g/dL (ref 6.4–8.3)

## 2013-12-15 MED ORDER — DARBEPOETIN ALFA 200 MCG/0.4ML IJ SOSY
200.0000 ug | PREFILLED_SYRINGE | Freq: Once | INTRAMUSCULAR | Status: AC
  Administered 2013-12-15: 200 ug via SUBCUTANEOUS
  Filled 2013-12-15: qty 0.4

## 2013-12-15 MED ORDER — MORPHINE SULFATE ER 30 MG PO TBCR: 30.0000 mg | EXTENDED_RELEASE_TABLET | Freq: Two times a day (BID) | ORAL | Status: DC

## 2013-12-15 MED ORDER — CYANOCOBALAMIN 1000 MCG/ML IJ SOLN
1000.0000 ug | Freq: Once | INTRAMUSCULAR | Status: AC
  Administered 2013-12-15: 1000 ug via INTRAMUSCULAR

## 2013-12-15 MED ORDER — FULVESTRANT 250 MG/5ML IM SOLN
500.0000 mg | Freq: Once | INTRAMUSCULAR | Status: AC
  Administered 2013-12-15: 500 mg via INTRAMUSCULAR
  Filled 2013-12-15: qty 10

## 2013-12-15 NOTE — Patient Instructions (Signed)
Cyanocobalamin, Vitamin B12 injection What is this medicine? CYANOCOBALAMIN (sye an oh koe BAL a min) is a man made form of vitamin B12. Vitamin B12 is used in the growth of healthy blood cells, nerve cells, and proteins in the body. It also helps with the metabolism of fats and carbohydrates. This medicine is used to treat people who can not absorb vitamin B12. This medicine may be used for other purposes; ask your health care provider or pharmacist if you have questions. COMMON BRAND NAME(S): Cyomin, LA-12, Nutri-Twelve, Primabalt What should I tell my health care provider before I take this medicine? They need to know if you have any of these conditions: -kidney disease -Leber's disease -megaloblastic anemia -an unusual or allergic reaction to cyanocobalamin, cobalt, other medicines, foods, dyes, or preservatives -pregnant or trying to get pregnant -breast-feeding How should I use this medicine? This medicine is injected into a muscle or deeply under the skin. It is usually given by a health care professional in a clinic or doctor's office. However, your doctor may teach you how to inject yourself. Follow all instructions. Talk to your pediatrician regarding the use of this medicine in children. Special care may be needed. Overdosage: If you think you have taken too much of this medicine contact a poison control center or emergency room at once. NOTE: This medicine is only for you. Do not share this medicine with others. What if I miss a dose? If you are given your dose at a clinic or doctor's office, call to reschedule your appointment. If you give your own injections and you miss a dose, take it as soon as you can. If it is almost time for your next dose, take only that dose. Do not take double or extra doses. What may interact with this medicine? -colchicine -heavy alcohol intake This list may not describe all possible interactions. Give your health care provider a list of all the  medicines, herbs, non-prescription drugs, or dietary supplements you use. Also tell them if you smoke, drink alcohol, or use illegal drugs. Some items may interact with your medicine. What should I watch for while using this medicine? Visit your doctor or health care professional regularly. You may need blood work done while you are taking this medicine. You may need to follow a special diet. Talk to your doctor. Limit your alcohol intake and avoid smoking to get the best benefit. What side effects may I notice from receiving this medicine? Side effects that you should report to your doctor or health care professional as soon as possible: -allergic reactions like skin rash, itching or hives, swelling of the face, lips, or tongue -blue tint to skin -chest tightness, pain -difficulty breathing, wheezing -dizziness -red, swollen painful area on the leg Side effects that usually do not require medical attention (report to your doctor or health care professional if they continue or are bothersome): -diarrhea -headache This list may not describe all possible side effects. Call your doctor for medical advice about side effects. You may report side effects to FDA at 1-800-FDA-1088. Where should I keep my medicine? Keep out of the reach of children. Store at room temperature between 15 and 30 degrees C (59 and 85 degrees F). Protect from light. Throw away any unused medicine after the expiration date. NOTE: This sheet is a summary. It may not cover all possible information. If you have questions about this medicine, talk to your doctor, pharmacist, or health care provider.  2015, [employer]. (2007-04-29 22:10:20) Darbepoetin  Alfa injection What is this medicine? DARBEPOETIN ALFA (dar be POE e tin AL fa) helps your body make more red blood cells. It is used to treat anemia caused by chronic kidney failure and chemotherapy. This medicine may be used for other purposes; ask your health care  provider or pharmacist if you have questions. COMMON BRAND NAME(S): Aranesp What should I tell my health care provider before I take this medicine? They need to know if you have any of these conditions: -blood clotting disorders or history of blood clots -cancer patient not on chemotherapy -cystic fibrosis -heart disease, such as angina, heart failure, or a history of a heart attack -hemoglobin level of 12 g/dL or greater -high blood pressure -low levels of folate, iron, or vitamin B12 -seizures -an unusual or allergic reaction to darbepoetin, erythropoietin, albumin, hamster proteins, latex, other medicines, foods, dyes, or preservatives -pregnant or trying to get pregnant -breast-feeding How should I use this medicine? This medicine is for injection into a vein or under the skin. It is usually given by a health care professional in a hospital or clinic setting. If you get this medicine at home, you will be taught how to prepare and give this medicine. Do not shake the solution before you withdraw a dose. Use exactly as directed. Take your medicine at regular intervals. Do not take your medicine more often than directed. It is important that you put your used needles and syringes in a special sharps container. Do not put them in a trash can. If you do not have a sharps container, call your pharmacist or healthcare provider to get one. Talk to your pediatrician regarding the use of this medicine in children. While this medicine may be used in children as young as 1 year for selected conditions, precautions do apply. Overdosage: If you think you have taken too much of this medicine contact a poison control center or emergency room at once. NOTE: This medicine is only for you. Do not share this medicine with others. What if I miss a dose? If you miss a dose, take it as soon as you can. If it is almost time for your next dose, take only that dose. Do not take double or extra doses. What may  interact with this medicine? Do not take this medicine with any of the following medications: -epoetin alfa This list may not describe all possible interactions. Give your health care provider a list of all the medicines, herbs, non-prescription drugs, or dietary supplements you use. Also tell them if you smoke, drink alcohol, or use illegal drugs. Some items may interact with your medicine. What should I watch for while using this medicine? Visit your prescriber or health care professional for regular checks on your progress and for the needed blood tests and blood pressure measurements. It is especially important for the doctor to make sure your hemoglobin level is in the desired range, to limit the risk of potential side effects and to give you the best benefit. Keep all appointments for any recommended tests. Check your blood pressure as directed. Ask your doctor what your blood pressure should be and when you should contact him or her. As your body makes more red blood cells, you may need to take iron, folic acid, or vitamin B supplements. Ask your doctor or health care provider which products are right for you. If you have kidney disease continue dietary restrictions, even though this medication can make you feel better. Talk with your doctor or health   care professional about the foods you eat and the vitamins that you take. What side effects may I notice from receiving this medicine? Side effects that you should report to your doctor or health care professional as soon as possible: -allergic reactions like skin rash, itching or hives, swelling of the face, lips, or tongue -breathing problems -changes in vision -chest pain -confusion, trouble speaking or understanding -feeling faint or lightheaded, falls -high blood pressure -muscle aches or pains -pain, swelling, warmth in the leg -rapid weight gain -severe headaches -sudden numbness or weakness of the face, arm or leg -trouble walking,  dizziness, loss of balance or coordination -seizures (convulsions) -swelling of the ankles, feet, hands -unusually weak or tired Side effects that usually do not require medical attention (report to your doctor or health care professional if they continue or are bothersome): -diarrhea -fever, chills (flu-like symptoms) -headaches -nausea, vomiting -redness, stinging, or swelling at site where injected This list may not describe all possible side effects. Call your doctor for medical advice about side effects. You may report side effects to FDA at 1-800-FDA-1088. Where should I keep my medicine? Keep out of the reach of children. Store in a refrigerator between 2 and 8 degrees C (36 and 46 degrees F). Do not freeze. Do not shake. Throw away any unused portion if using a single-dose vial. Throw away any unused medicine after the expiration date. NOTE: This sheet is a summary. It may not cover all possible information. If you have questions about this medicine, talk to your doctor, pharmacist, or health care provider.  2015, [employer]. (2007-12-31 10:23:57) Fulvestrant injection What is this medicine? FULVESTRANT (ful VES trant) blocks the effects of estrogen. It is used to treat breast cancer in women past the age of menopause. This medicine may be used for other purposes; ask your health care provider or pharmacist if you have questions. COMMON BRAND NAME(S): FASLODEX What should I tell my health care provider before I take this medicine? They need to know if you have any of these conditions: -bleeding problems -liver disease -low levels of platelets in the blood -an unusual or allergic reaction to fulvestrant, other medicines, foods, dyes, or preservatives -pregnant or trying to get pregnant -breast-feeding How should I use this medicine? This medicine is for injection into a muscle. It is usually given by a health care professional in a hospital or clinic setting. Talk  to your pediatrician regarding the use of this medicine in children. Special care may be needed. Overdosage: If you think you have taken too much of this medicine contact a poison control center or emergency room at once. NOTE: This medicine is only for you. Do not share this medicine with others. What if I miss a dose? It is important not to miss your dose. Call your doctor or health care professional if you are unable to keep an appointment. What may interact with this medicine? -medicines that treat or prevent blood clots like warfarin, enoxaparin, and dalteparin This list may not describe all possible interactions. Give your health care provider a list of all the medicines, herbs, non-prescription drugs, or dietary supplements you use. Also tell them if you smoke, drink alcohol, or use illegal drugs. Some items may interact with your medicine. What should I watch for while using this medicine? Your condition will be monitored carefully while you are receiving this medicine. You will need important blood work done while you are taking this medicine. Do not become pregnant while taking this medicine.  Women should inform their doctor if they wish to become pregnant or think they might be pregnant. There is a potential for serious side effects to an unborn child. Talk to your health care professional or pharmacist for more information. What side effects may I notice from receiving this medicine? Side effects that you should report to your doctor or health care professional as soon as possible: -allergic reactions like skin rash, itching or hives, swelling of the face, lips, or tongue -feeling faint or lightheaded, falls -fever or flu-like symptoms -sore throat -vaginal bleeding Side effects that usually do not require medical attention (report to your doctor or health care professional if they continue or are bothersome): -aches, pains -constipation or diarrhea -headache -hot flashes -nausea,  vomiting -pain at site where injected -stomach pain This list may not describe all possible side effects. Call your doctor for medical advice about side effects. You may report side effects to FDA at 1-800-FDA-1088. Where should I keep my medicine? This drug is given in a hospital or clinic and will not be stored at home. NOTE: This sheet is a summary. It may not cover all possible information. If you have questions about this medicine, talk to your doctor, pharmacist, or health care provider.  2015, [employer]. (2007-05-27 15:39:24)

## 2013-12-15 NOTE — Telephone Encounter (Signed)
, °

## 2013-12-15 NOTE — Progress Notes (Signed)
Patient ID: Brittney Cunningham, female   DOB: 1940-09-17, 73 y.o.   MRN: [id number]  [facility]  Telephone:([phone]) [phone] [phone]) [phone]  OFFICE PROGRESS NOTE   ID: CELISSE CIULLA   DOB: December 18, 1940  MR#: [id number]  [facility]#:[id number]   PCP: Penni Homans, MD SU: Rolm Bookbinder OTHER MD: Teena Dunk, Kyung Rudd, Kevin Supple, Peter Martinique  CHIEF COMPLAINT:  Metastatic Breast Cancer   HISTORY OF PRESENT ILLNESS: Brittney Cunningham was diagnosed with right breast carcinoma in May of 2011, a biopsy showing a grade 2 invasive ductal carcinoma, T2 NXM1, stage IV at presentation. There was bone only involvement. Tumor was strongly ER and PR positive, HER-2/neu negative, with an MIP-1 of 26%.  She was treated neoadjuvantly with letrozole and zoledronic acid beginning in June of 2011. She is status post right modified radical mastectomy in February 2012 for a ypT2 ypN2, grade 1 invasive ductal carcinoma. Margins were negative.   Her subsequent history is as detailed below   INTERVAL HISTORY: Brittney Cunningham returns today for followup of her metastatic breast cancer as well as her B12 deficiency and anemia of renal failure.. She is on monthly fulvestrant for the breast cancer. She receives Aranesp at the same time. She also gets her B-12 shot here monthly. Since her last visit here she had an admission where she was reevaluated and found to have of course significant coronary artery disease. This is being treated medically. Also her husband is now being treated by one of my partners, Dr.Gorsuch, and is considering a transplantation at [facility]. At this point Kionna thinks he is not going to go for it. All this however is an extra strain on her of course.  REVIEW OF SYSTEMS: Brittney Cunningham Is working hard to lose weight and indeed has managed to get down 289 pounds. The pain from her bone metastases is very well-controlled on MS Contin 30 mg twice daily and she tells me she is no longer using diluted or Percocet  for breakthrough pain. Occasionally she uses hot or cold compresses particularly for her shoulders, which are the areas where she hurts the most. She does get constipated from the morphine but take stool softeners for that. She feels moderately fatigued. She gets short of breath when walking up stairs but is able to get to the top. She tells me her diabetes is much better controlled. She denies unusual headaches, visual changes, nausea, or vomiting. A detailed review of systems today was otherwise stable  PAST MEDICAL HISTORY: Past Medical History  Diagnosis Date  . Anemia   . Depression   . Diabetes mellitus type II   . GERD (gastroesophageal reflux disease)   . Hyperlipidemia   . Hypothyroidism   . Osteoarthritis   . Carotid artery stenosis     bilateral  . CAD (coronary artery disease)     medical therapy, old tot RCA, patent LAD, mod. severe ostial LCX stenosis  . OSA (obstructive sleep apnea)   . Breast cancer     right breast cancer-invasive  ductal carcinoma (StageIV)  . Hypertension   . Anxiety   . Carotid artery occlusion   . Unspecified constipation 03/15/2012  . Bursitis   . Collagen vascular disease   . History of radiation therapy 10/31/12-11/13/12    rt&lt humerous 30Gy/20f  . Valvular heart disease 07/11/2013  . Diarrhea 09/28/2013    PAST SURGICAL HISTORY: Past Surgical History  Procedure Laterality Date  . Incisional breast biopsy      remoted left breast  biopsy  . Cesarean section    . Other surgical history      GYN surgery  . Knee surgery      right knee surgery  . Carotid endarterectomy      right carotid  . Port-a-cath removal    . Modified mastectomy      Right breast  . Breast surgery    . Mastectomy    . Carotid stent      left  . Cardiac catheterization  06/2013    old total occ. of RCA, moderately severe ostial LCX stenosis- medical therapy- would be complex PCI    FAMILY HISTORY Family History  Problem Relation Age of Onset  . Arthritis     . Coronary artery disease      first degree relative  . Stroke      first degree relative  . Diabetes Mother   . Heart disease Mother   . Hypertension Mother   . Cancer Mother   . Deep vein thrombosis Mother   . Colon cancer Neg Hx   . Stomach cancer Neg Hx   . Cancer Father     Pancreatic cancer  . Breast cancer Paternal Aunt 81  The patient's father died from pancreatic cancer at the age of 4. The patient's mother died from complications of diabetes and heart disease at the age of 49. The patient has 2 sisters and 2 brothers. There is no history of breast cancer or ovarian cancer in the immediate family. One of the patient's paternal aunts out of 6 paternal aunts had breast cancer diagnosed in her 68s.    GYNECOLOGIC HISTORY: The patient is GX, P3. First pregnancy to term age 5. She went through the change of life in her late 24s. She never took hormones.   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 Her husband, Brittney Cunningham, is disabled secondary to a fall.  He has difficulty with walking. He used to work for [employer] previously. The patient's son, Brittney Cunningham, lives in [place], and works for [employer]. Daughter, Brittney Cunningham, lives in [place], and works for [employer]. Daughter, Brittney Cunningham, lives in [place], [place], and works as a Geologist, engineering. The patient attends [employer].  ADVANCED DIRECTIVES: Not on file  HEALTH MAINTENANCE:  (Updated 04/23/2013) History  Substance Use Topics  . Smoking status: Former Smoker -- 1.00 packs/day for 20 years    Types: Cigarettes    Quit date: 01/31/1988  . Smokeless tobacco: Never Used  . Alcohol Use: No    Colonoscopy: Not on file  PAP: Not on file  Bone density: July 2011, Normal  Lipid panel: 06/26/2012  Allergies  Allergen Reactions  . Chlorhexidine Hives, Itching and Rash    This was most likely a CONTACT DERMATITIS versus true systemic allergic  reaction  . Adhesive [Tape] Hives  . Codeine Swelling    Current Outpatient Prescriptions  Medication Sig Dispense Refill  . aspirin 81 MG chewable tablet Chew 81 mg by mouth daily.    Marland Kitchen atorvastatin (LIPITOR) 80 MG tablet Take 1 tablet (80 mg total) by mouth daily. 30 tablet 6  . Blood Glucose Monitoring Suppl (ACCU-CHEK NANO SMARTVIEW) W/DEVICE KIT Use to check blood sugar twice a day. DX 250.00 1 kit 0  . carvedilol (COREG) 25 MG tablet Take 1 tablet (25 mg total) by mouth 2 (two) times daily with a meal. 60 tablet 6  . clopidogrel (PLAVIX) 75 MG tablet  Take 1 tablet (75 mg total) by mouth daily. 30 tablet 8  . cyanocobalamin (,VITAMIN B-12,) 1000 MCG/ML injection Inject 1,000 mcg into the muscle every 30 (thirty) days. Last dose 03/09/2011    . cyclobenzaprine (FLEXERIL) 10 MG tablet Take 1 tablet (10 mg total) by mouth 3 (three) times daily as needed for muscle spasms. 30 tablet 0  . darbepoetin (ARANESP) 200 MCG/0.4ML SOLN Inject 200 mcg into the skin once. Every 30 days    . ferrous fumarate (HEMOCYTE - 106 MG FE) 325 (106 FE) MG TABS tablet Take 1 tablet (106 mg of iron total) by mouth daily. 30 each 3  . glucose blood (ACCU-CHEK AVIVA) test strip Use as instructed to check blood sugar twice a day  Dx 250.00 100 each 3  . glycerin adult (GLYCERIN ADULT) 2 G SUPP Place 1 suppository rectally once as needed (constipation). 10 suppository 0  . hydrochlorothiazide (MICROZIDE) 12.5 MG capsule Take 1 capsule (12.5 mg total) by mouth daily. 30 capsule 3  . HYDROmorphone (DILAUDID) 2 MG tablet Take 1 tablet (2 mg total) by mouth every 6 (six) hours as needed for severe pain. 10 tablet 0  . insulin glargine (LANTUS) 100 UNIT/ML injection Inject 0.6 mLs (60 Units total) into the skin at bedtime. Pt wants vials. 30 mL 1  . isosorbide mononitrate (IMDUR) 60 MG 24 hr tablet Take 2 tablets (120 mg total) by mouth daily. 60 tablet 11  . levothyroxine (SYNTHROID, LEVOTHROID) 75 MCG tablet Take 1 tablet  (75 mcg total) by mouth daily before breakfast. 30 tablet 1  . metFORMIN (GLUCOPHAGE) 500 MG tablet Take 1 tablet (500 mg total) by mouth every morning. 30 tablet 5  . morphine (MS CONTIN) 30 MG 12 hr tablet Take 1 tablet (30 mg total) by mouth 2 (two) times daily. 60 tablet 0  . nitroGLYCERIN (NITROSTAT) 0.4 MG SL tablet Place 1 tablet (0.4 mg total) under the tongue every 5 (five) minutes as needed for chest pain. 30 tablet 0  . ondansetron (ZOFRAN) 4 MG tablet Take 1 tablet (4 mg total) by mouth every 8 (eight) hours as needed for nausea or vomiting. 20 tablet 0  . oxyCODONE-acetaminophen (PERCOCET) 5-325 MG per tablet Take 1 tablet by mouth every 4 (four) hours as needed for moderate pain. 20 tablet 0  . polyethylene glycol (MIRALAX / GLYCOLAX) packet Take 17 g by mouth daily as needed.    . sodium polystyrene (KAYEXALATE) 15 GM/60ML suspension Take 60 mLs (15 g total) by mouth once. 500 mL 0  . [DISCONTINUED] simvastatin (ZOCOR) 40 MG tablet Take 40 mg by mouth every evening.     No current facility-administered medications for this visit.    OBJECTIVE: Elderly white woman  Who appears stated age 58 Vitals:   12/15/13 1009  BP: 130/40  Pulse: 53  Temp: 97.6 F (36.4 C)  Resp: 18     Body mass index is 33.61 kg/(m^2).    ECOG FS: 1 Filed Weights   12/15/13 1009  Weight: 189 lb 11.2 oz (86.047 kg)   Sclerae unicteric, pupils round and equal Oropharynx clear, dentition in poor repair No cervical or supraclavicular adenopathy Lungs no rales or rhonchi Heart regular rate and rhythm Abd soft, obese, nontender, positive bowel sounds MSK no focal spinal tenderness, some right upper extremities swelling without erythema Neuro: nonfocal, well oriented, Appropriate affect Breasts:  the right breast is status post mastectomy.there is no evidence of chest wall recurrence. The right axilla is benign. The  left breast is unremarkable.   LAB RESULTS: Lab Results  Component Value Date    WBC 5.8 12/15/2013   NEUTROABS 3.6 12/15/2013   HGB 10.0* 12/15/2013   HCT 32.7* 12/15/2013   MCV 92.0 12/15/2013   PLT 250 12/15/2013      Chemistry      Component Value Date/Time   NA 135* 12/15/2013 0916   NA 135* 10/19/2013 0144   K 4.9 12/15/2013 0916   K 4.9 10/19/2013 0144   CL 94* 10/19/2013 0144   CL 104 07/08/2012 1023   CO2 23 12/15/2013 0916   CO2 21 10/19/2013 0144   BUN 21.3 12/15/2013 0916   BUN 28* 10/19/2013 0144   CREATININE 1.1 12/15/2013 0916   CREATININE 1.40* 10/19/2013 0144   CREATININE 1.20* 09/26/2013 1331      Component Value Date/Time   CALCIUM 9.4 12/15/2013 0916   CALCIUM 9.7 10/19/2013 0144   ALKPHOS 165* 12/15/2013 0916   ALKPHOS 146* 10/19/2013 0144   AST 19 12/15/2013 0916   AST 28 10/19/2013 0144   ALT 11 12/15/2013 0916   ALT 8 10/19/2013 0144   BILITOT 0.56 12/15/2013 0916   BILITOT 0.3 10/19/2013 0144      STUDIES: No results found.  ASSESSMENT: 73 y.o. [place] woman:   (1) Status post right breast biopsy in 05/2009 for a grade 2 invasive ductal carcinoma, T2 NX M1, Stage IV, with bone-only involvement, strongly estrogen and progesterone receptor-positive, HER-2-negative with an MIB-1 of 26%.   (2) Neoadjuvantly she received Letrozole and zoledronic acid beginning in June of 2011 and underwent right modified radical mastectomy February of 2012 for a ypT2 ypN2, grade 1 invasive ductal carcinoma with negative margins.   (3) She completed radiation therapy in August of 2012.   (4) She has continued on Letrozole but was switched from Zoledronic acid to Denosumab Delton See) because of concerns regarding her serum creatinine. Delton See was being given every 8 weeks.  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  (6) symptomatic anemia, with creatinine clearance < 60 cc/min; Darbepoietin Q14d started 11/03/2011; received Feraheme 01/05/2012  (7) On subcutaneous B-12 supplementation monthly.  (8)  Pain in left upper extremity with osseous metastasis, osteoarthritis, tendinopathy, and bursitis as confirmed by recent MRI.  (9) Anemia, multifactorial with renal disease, on Aranesp monthly  (10)  letrozole was discontinued in September 2014 with evidence of disease progression. She was started on fulvestrant injections, first given on 10/17/2012.   PLAN: Tonilynn is doing remarkably well as far as her breast cancer is concerned. She is now off "breakthrough" narcotics, and even wonders whether she needs the MS Contin. She is on relatively low dose, and I do not see any problems with continuing it. I refilled her prescription today.  We are continuing the fulvestrant and Aranesp as before. She also gets a B-12 shot monthly. She has significant coronary issues which are much more threatening to her than the breast cancer and for that reason I am not pursuing a policy of close monitoring with scans, etc. Total as we are controlling her symptoms in her case I feel we are doing a good job.  Accordingly she will continue to see [place] on a monthly basis for her shots and every 3 months for additional lab work and exam. She knows to call for any problems that may develop before her next visit here.    Chauncey Cruel, MD  12/15/2013  10:19 AM

## 2014-01-07 ENCOUNTER — Ambulatory Visit: Payer: Self-pay | Admitting: Family

## 2014-01-07 ENCOUNTER — Other Ambulatory Visit (HOSPITAL_COMMUNITY): Payer: Self-pay

## 2014-01-07 ENCOUNTER — Encounter: Payer: Self-pay | Admitting: Medical

## 2014-01-07 ENCOUNTER — Ambulatory Visit (INDEPENDENT_AMBULATORY_CARE_PROVIDER_SITE_OTHER): Payer: Medicare Other | Admitting: Medical

## 2014-01-07 VITALS — BP 129/74 | HR 68 | Temp 98.5°F | Ht 63.0 in | Wt 189.3 lb

## 2014-01-07 DIAGNOSIS — Z0000 Encounter for general adult medical examination without abnormal findings: Secondary | ICD-10-CM

## 2014-01-07 DIAGNOSIS — Z1211 Encounter for screening for malignant neoplasm of colon: Secondary | ICD-10-CM

## 2014-01-07 NOTE — Assessment & Plan Note (Signed)
Pt states will get 2nd  pneumo vaccine with her oncologist this month on 14th. Our chart states she is up to date. Will give hemocult cards today since she never had colnoscopy(which she declines due to her medical problems). Pt declined bone scan today. She had side effects from meds for osteoporosis in the past.  She is up to date on mammogram. Decline papsmear and not indicated at her age. Her last lipid panel was in December and good.    Pt will see optometrist in January.   I did give advanced directive sheets and explained benefit of filling out in light of her age and medical problems. Formed explained to the patient.

## 2014-01-07 NOTE — Patient Instructions (Addendum)
For your wellness today. Please do the hemoccult cards. Since you have history never had colonoscopy and do not want one. If test were positive then could refer to GI.  See optometrist for eye exam in January.  Follow up with oncologist on the pneumo vaccine you state you will get with oncologist. Our records say you are up to date. But follow oncologist advise.  Pt fill out and turn in copy of your advanced directive.   I will ask put future labs in for diabetes. You will see Dr. Randel Pigg after first of the year.   Preventive Care for Adults A healthy lifestyle and preventive care can promote health and wellness. Preventive health guidelines for women include the following key practices.  A routine yearly physical is a good way to check with your health care provider about your health and preventive screening. It is a chance to share any concerns and updates on your health and to receive a thorough exam.  Visit your dentist for a routine exam and preventive care every 6 months. Brush your teeth twice a day and floss once a day. Good oral hygiene prevents tooth decay and gum disease.  The frequency of eye exams is based on your age, health, family medical history, use of contact lenses, and other factors. Follow your health care provider's recommendations for frequency of eye exams.  Eat a healthy diet. Foods like vegetables, fruits, whole grains, low-fat dairy products, and lean protein foods contain the nutrients you need without too many calories. Decrease your intake of foods high in solid fats, added sugars, and salt. Eat the right amount of calories for you.Get information about a proper diet from your health care provider, if necessary.  Regular physical exercise is one of the most important things you can do for your health. Most adults should get at least 150 minutes of moderate-intensity exercise (any activity that increases your heart rate and causes you to sweat) each week. In  addition, most adults need muscle-strengthening exercises on 2 or more days a week.  Maintain a healthy weight. The body mass index (BMI) is a screening tool to identify possible weight problems. It provides an estimate of body fat based on height and weight. Your health care provider can find your BMI and can help you achieve or maintain a healthy weight.For adults 20 years and older:  A BMI below 18.5 is considered underweight.  A BMI of 18.5 to 24.9 is normal.  A BMI of 25 to 29.9 is considered overweight.  A BMI of 30 and above is considered obese.  Maintain normal blood lipids and cholesterol levels by exercising and minimizing your intake of saturated fat. Eat a balanced diet with plenty of fruit and vegetables. Blood tests for lipids and cholesterol should begin at age 49 and be repeated every 5 years. If your lipid or cholesterol levels are high, you are over 50, or you are at high risk for heart disease, you may need your cholesterol levels checked more frequently.Ongoing high lipid and cholesterol levels should be treated with medicines if diet and exercise are not working.  If you smoke, find out from your health care provider how to quit. If you do not use tobacco, do not start.  Lung cancer screening is recommended for adults aged 76-80 years who are at high risk for developing lung cancer because of a history of smoking. A yearly low-dose CT scan of the lungs is recommended for people who have at least a 30-pack-year  history of smoking and are a current smoker or have quit within the past 15 years. A pack year of smoking is smoking an average of 1 pack of cigarettes a day for 1 year (for example: 1 pack a day for 30 years or 2 packs a day for 15 years). Yearly screening should continue until the smoker has stopped smoking for at least 15 years. Yearly screening should be stopped for people who develop a health problem that would prevent them from having lung cancer treatment.  If  you are pregnant, do not drink alcohol. If you are breastfeeding, be very cautious about drinking alcohol. If you are not pregnant and choose to drink alcohol, do not have more than 1 drink per day. One drink is considered to be 12 ounces (355 mL) of beer, 5 ounces (148 mL) of wine, or 1.5 ounces (44 mL) of liquor.  Avoid use of street drugs. Do not share needles with anyone. Ask for help if you need support or instructions about stopping the use of drugs.  High blood pressure causes heart disease and increases the risk of stroke. Your blood pressure should be checked at least every 1 to 2 years. Ongoing high blood pressure should be treated with medicines if weight loss and exercise do not work.  If you are 38-60 years old, ask your health care provider if you should take aspirin to prevent strokes.  Diabetes screening involves taking a blood sample to check your fasting blood sugar level. This should be done once every 3 years, after age 14, if you are within normal weight and without risk factors for diabetes. Testing should be considered at a younger age or be carried out more frequently if you are overweight and have at least 1 risk factor for diabetes.  Breast cancer screening is essential preventive care for women. You should practice "breast self-awareness." This means understanding the normal appearance and feel of your breasts and may include breast self-examination. Any changes detected, no matter how small, should be reported to a health care provider. Women in their 106s and 30s should have a clinical breast exam (CBE) by a health care provider as part of a regular health exam every 1 to 3 years. After age 68, women should have a CBE every year. Starting at age 60, women should consider having a mammogram (breast X-ray test) every year. Women who have a family history of breast cancer should talk to their health care provider about genetic screening. Women at a high risk of breast cancer should  talk to their health care providers about having an MRI and a mammogram every year.  Breast cancer gene (BRCA)-related cancer risk assessment is recommended for women who have family members with BRCA-related cancers. BRCA-related cancers include breast, ovarian, tubal, and peritoneal cancers. Having family members with these cancers may be associated with an increased risk for harmful changes (mutations) in the breast cancer genes BRCA1 and BRCA2. Results of the assessment will determine the need for genetic counseling and BRCA1 and BRCA2 testing.  Routine pelvic exams to screen for cancer are no longer recommended for nonpregnant women who are considered low risk for cancer of the pelvic organs (ovaries, uterus, and vagina) and who do not have symptoms. Ask your health care provider if a screening pelvic exam is right for you.  If you have had past treatment for cervical cancer or a condition that could lead to cancer, you need Pap tests and screening for cancer for at least  20 years after your treatment. If Pap tests have been discontinued, your risk factors (such as having a new sexual partner) need to be reassessed to determine if screening should be resumed. Some women have medical problems that increase the chance of getting cervical cancer. In these cases, your health care provider may recommend more frequent screening and Pap tests.  The HPV test is an additional test that may be used for cervical cancer screening. The HPV test looks for the virus that can cause the cell changes on the cervix. The cells collected during the Pap test can be tested for HPV. The HPV test could be used to screen women aged 68 years and older, and should be used in women of any age who have unclear Pap test results. After the age of 36, women should have HPV testing at the same frequency as a Pap test.  Colorectal cancer can be detected and often prevented. Most routine colorectal cancer screening begins at the age of  71 years and continues through age 44 years. However, your health care provider may recommend screening at an earlier age if you have risk factors for colon cancer. On a yearly basis, your health care provider may provide home test kits to check for hidden blood in the stool. Use of a small camera at the end of a tube, to directly examine the colon (sigmoidoscopy or colonoscopy), can detect the earliest forms of colorectal cancer. Talk to your health care provider about this at age 69, when routine screening begins. Direct exam of the colon should be repeated every 5-10 years through age 17 years, unless early forms of pre-cancerous polyps or small growths are found.  People who are at an increased risk for hepatitis B should be screened for this virus. You are considered at high risk for hepatitis B if:  You were born in a country where hepatitis B occurs often. Talk with your health care provider about which countries are considered high risk.  Your parents were born in a high-risk country and you have not received a shot to protect against hepatitis B (hepatitis B vaccine).  You have HIV or AIDS.  You use needles to inject street drugs.  You live with, or have sex with, someone who has hepatitis B.  You get hemodialysis treatment.  You take certain medicines for conditions like cancer, organ transplantation, and autoimmune conditions.  Hepatitis C blood testing is recommended for all people born from [birth year] through 1965 and any individual with known risks for hepatitis C.  Practice safe sex. Use condoms and avoid high-risk sexual practices to reduce the spread of sexually transmitted infections (STIs). STIs include gonorrhea, chlamydia, syphilis, trichomonas, herpes, HPV, and human immunodeficiency virus (HIV). Herpes, HIV, and HPV are viral illnesses that have no cure. They can result in disability, cancer, and death.  You should be screened for sexually transmitted illnesses (STIs)  including gonorrhea and chlamydia if:  You are sexually active and are younger than 24 years.  You are older than 24 years and your health care provider tells you that you are at risk for this type of infection.  Your sexual activity has changed since you were last screened and you are at an increased risk for chlamydia or gonorrhea. Ask your health care provider if you are at risk.  If you are at risk of being infected with HIV, it is recommended that you take a prescription medicine daily to prevent HIV infection. This is called preexposure prophylaxis (PrEP).  You are considered at risk if:  You are a heterosexual woman, are sexually active, and are at increased risk for HIV infection.  You take drugs by injection.  You are sexually active with a partner who has HIV.  Talk with your health care provider about whether you are at high risk of being infected with HIV. If you choose to begin PrEP, you should first be tested for HIV. You should then be tested every 3 months for as long as you are taking PrEP.  Osteoporosis is a disease in which the bones lose minerals and strength with aging. This can result in serious bone fractures or breaks. The risk of osteoporosis can be identified using a bone density scan. Women ages 66 years and over and women at risk for fractures or osteoporosis should discuss screening with their health care providers. Ask your health care provider whether you should take a calcium supplement or vitamin D to reduce the rate of osteoporosis.  Menopause can be associated with physical symptoms and risks. Hormone replacement therapy is available to decrease symptoms and risks. You should talk to your health care provider about whether hormone replacement therapy is right for you.  Use sunscreen. Apply sunscreen liberally and repeatedly throughout the day. You should seek shade when your shadow is shorter than you. Protect yourself by wearing long sleeves, pants, a  wide-brimmed hat, and sunglasses year round, whenever you are outdoors.  Once a month, do a whole body skin exam, using a mirror to look at the skin on your back. Tell your health care provider of new moles, moles that have irregular borders, moles that are larger than a pencil eraser, or moles that have changed in shape or color.  Stay current with required vaccines (immunizations).  Influenza vaccine. All adults should be immunized every year.  Tetanus, diphtheria, and acellular pertussis (Td, Tdap) vaccine. Pregnant women should receive 1 dose of Tdap vaccine during each pregnancy. The dose should be obtained regardless of the length of time since the last dose. Immunization is preferred during the 27th-36th week of gestation. An adult who has not previously received Tdap or who does not know her vaccine status should receive 1 dose of Tdap. This initial dose should be followed by tetanus and diphtheria toxoids (Td) booster doses every 10 years. Adults with an unknown or incomplete history of completing a 3-dose immunization series with Td-containing vaccines should begin or complete a primary immunization series including a Tdap dose. Adults should receive a Td booster every 10 years.  Varicella vaccine. An adult without evidence of immunity to varicella should receive 2 doses or a second dose if she has previously received 1 dose. Pregnant females who do not have evidence of immunity should receive the first dose after pregnancy. This first dose should be obtained before leaving the health care facility. The second dose should be obtained 4-8 weeks after the first dose.  Human papillomavirus (HPV) vaccine. Females aged 13-26 years who have not received the vaccine previously should obtain the 3-dose series. The vaccine is not recommended for use in pregnant females. However, pregnancy testing is not needed before receiving a dose. If a female is found to be pregnant after receiving a dose, no  treatment is needed. In that case, the remaining doses should be delayed until after the pregnancy. Immunization is recommended for any person with an immunocompromised condition through the age of 94 years if she did not get any or all doses earlier. During the 3-dose  series, the second dose should be obtained 4-8 weeks after the first dose. The third dose should be obtained 24 weeks after the first dose and 16 weeks after the second dose.  Zoster vaccine. One dose is recommended for adults aged 15 years or older unless certain conditions are present.  Measles, mumps, and rubella (MMR) vaccine. Adults born before [birth year] generally are considered immune to measles and mumps. Adults born in [birth year] or later should have 1 or more doses of MMR vaccine unless there is a contraindication to the vaccine or there is laboratory evidence of immunity to each of the three diseases. A routine second dose of MMR vaccine should be obtained at least 28 days after the first dose for students attending postsecondary schools, health care workers, or international travelers. People who received inactivated measles vaccine or an unknown type of measles vaccine during 1963-1967 should receive 2 doses of MMR vaccine. People who received inactivated mumps vaccine or an unknown type of mumps vaccine before 1979 and are at high risk for mumps infection should consider immunization with 2 doses of MMR vaccine. For females of childbearing age, rubella immunity should be determined. If there is no evidence of immunity, females who are not pregnant should be vaccinated. If there is no evidence of immunity, females who are pregnant should delay immunization until after pregnancy. Unvaccinated health care workers born before [birth year] who lack laboratory evidence of measles, mumps, or rubella immunity or laboratory confirmation of disease should consider measles and mumps immunization with 2 doses of MMR vaccine or rubella immunization with 1 dose of  MMR vaccine.  Pneumococcal 13-valent conjugate (PCV13) vaccine. When indicated, a person who is uncertain of her immunization history and has no record of immunization should receive the PCV13 vaccine. An adult aged 40 years or older who has certain medical conditions and has not been previously immunized should receive 1 dose of PCV13 vaccine. This PCV13 should be followed with a dose of pneumococcal polysaccharide (PPSV23) vaccine. The PPSV23 vaccine dose should be obtained at least 8 weeks after the dose of PCV13 vaccine. An adult aged 41 years or older who has certain medical conditions and previously received 1 or more doses of PPSV23 vaccine should receive 1 dose of PCV13. The PCV13 vaccine dose should be obtained 1 or more years after the last PPSV23 vaccine dose.  Pneumococcal polysaccharide (PPSV23) vaccine. When PCV13 is also indicated, PCV13 should be obtained first. All adults aged 65 years and older should be immunized. An adult younger than age 16 years who has certain medical conditions should be immunized. Any person who resides in a nursing home or long-term care facility should be immunized. An adult smoker should be immunized. People with an immunocompromised condition and certain other conditions should receive both PCV13 and PPSV23 vaccines. People with human immunodeficiency virus (HIV) infection should be immunized as soon as possible after diagnosis. Immunization during chemotherapy or radiation therapy should be avoided. Routine use of PPSV23 vaccine is not recommended for American Indians, [place] Natives, or people younger than 65 years unless there are medical conditions that require PPSV23 vaccine. When indicated, people who have unknown immunization and have no record of immunization should receive PPSV23 vaccine. One-time revaccination 5 years after the first dose of PPSV23 is recommended for people aged 19-64 years who have chronic kidney failure, nephrotic syndrome, asplenia, or  immunocompromised conditions. People who received 1-2 doses of PPSV23 before age 33 years should receive another dose of PPSV23 vaccine at age 47  years or later if at least 5 years have passed since the previous dose. Doses of PPSV23 are not needed for people immunized with PPSV23 at or after age 92 years.  Meningococcal vaccine. Adults with asplenia or persistent complement component deficiencies should receive 2 doses of quadrivalent meningococcal conjugate (MenACWY-D) vaccine. The doses should be obtained at least 2 months apart. Microbiologists working with certain meningococcal bacteria, [employer] recruits, people at risk during an outbreak, and people who travel to or live in countries with a high rate of meningitis should be immunized. A first-year college student up through age 80 years who is living in a residence hall should receive a dose if she did not receive a dose on or after her 16th birthday. Adults who have certain high-risk conditions should receive one or more doses of vaccine.  Hepatitis A vaccine. Adults who wish to be protected from this disease, have certain high-risk conditions, work with hepatitis A-infected animals, work in hepatitis A research labs, or travel to or work in countries with a high rate of hepatitis A should be immunized. Adults who were previously unvaccinated and who anticipate close contact with an international adoptee during the first 60 days after arrival in the [place] from a country with a high rate of hepatitis A should be immunized.  Hepatitis B vaccine. Adults who wish to be protected from this disease, have certain high-risk conditions, may be exposed to blood or other infectious body fluids, are household contacts or sex partners of hepatitis B positive people, are clients or workers in certain care facilities, or travel to or work in countries with a high rate of hepatitis B should be immunized.  Haemophilus influenzae type b (Hib) vaccine. A  previously unvaccinated person with asplenia or sickle cell disease or having a scheduled splenectomy should receive 1 dose of Hib vaccine. Regardless of previous immunization, a recipient of a hematopoietic stem cell transplant should receive a 3-dose series 6-12 months after her successful transplant. Hib vaccine is not recommended for adults with HIV infection. Preventive Services / Frequency Ages 82 to 33 years  Blood pressure check.** / Every 1 to 2 years.  Lipid and cholesterol check.** / Every 5 years beginning at age 61.  Clinical breast exam.** / Every 3 years for women in their 56s and 1s.  BRCA-related cancer risk assessment.** / For women who have family members with a BRCA-related cancer (breast, ovarian, tubal, or peritoneal cancers).  Pap test.** / Every 2 years from ages 38 through 20. Every 3 years starting at age 63 through age 65 or 50 with a history of 3 consecutive normal Pap tests.  HPV screening.** / Every 3 years from ages 63 through ages 56 to 67 with a history of 3 consecutive normal Pap tests.  Hepatitis C blood test.** / For any individual with known risks for hepatitis C.  Skin self-exam. / Monthly.  Influenza vaccine. / Every year.  Tetanus, diphtheria, and acellular pertussis (Tdap, Td) vaccine.** / Consult your health care provider. Pregnant women should receive 1 dose of Tdap vaccine during each pregnancy. 1 dose of Td every 10 years.  Varicella vaccine.** / Consult your health care provider. Pregnant females who do not have evidence of immunity should receive the first dose after pregnancy.  HPV vaccine. / 3 doses over 6 months, if 74 and younger. The vaccine is not recommended for use in pregnant females. However, pregnancy testing is not needed before receiving a dose.  Measles, mumps, rubella (MMR) vaccine.** /  You need at least 1 dose of MMR if you were born in 1957 or later. You may also need a 2nd dose. For females of childbearing age, rubella  immunity should be determined. If there is no evidence of immunity, females who are not pregnant should be vaccinated. If there is no evidence of immunity, females who are pregnant should delay immunization until after pregnancy.  Pneumococcal 13-valent conjugate (PCV13) vaccine.** / Consult your health care provider.  Pneumococcal polysaccharide (PPSV23) vaccine.** / 1 to 2 doses if you smoke cigarettes or if you have certain conditions.  Meningococcal vaccine.** / 1 dose if you are age 51 to 59 years and a Orthoptist living in a residence hall, or have one of several medical conditions, you need to get vaccinated against meningococcal disease. You may also need additional booster doses.  Hepatitis A vaccine.** / Consult your health care provider.  Hepatitis B vaccine.** / Consult your health care provider.  Haemophilus influenzae type b (Hib) vaccine.** / Consult your health care provider. Ages 47 to 64 years  Blood pressure check.** / Every 1 to 2 years.  Lipid and cholesterol check.** / Every 5 years beginning at age 54 years.  Lung cancer screening. / Every year if you are aged 55-80 years and have a 30-pack-year history of smoking and currently smoke or have quit within the past 15 years. Yearly screening is stopped once you have quit smoking for at least 15 years or develop a health problem that would prevent you from having lung cancer treatment.  Clinical breast exam.** / Every year after age 75 years.  BRCA-related cancer risk assessment.** / For women who have family members with a BRCA-related cancer (breast, ovarian, tubal, or peritoneal cancers).  Mammogram.** / Every year beginning at age 60 years and continuing for as long as you are in good health. Consult with your health care provider.  Pap test.** / Every 3 years starting at age 80 years through age 40 or 39 years with a history of 3 consecutive normal Pap tests.  HPV screening.** / Every 3 years from  ages 50 years through ages 47 to 32 years with a history of 3 consecutive normal Pap tests.  Fecal occult blood test (FOBT) of stool. / Every year beginning at age 105 years and continuing until age 9 years. You may not need to do this test if you get a colonoscopy every 10 years.  Flexible sigmoidoscopy or colonoscopy.** / Every 5 years for a flexible sigmoidoscopy or every 10 years for a colonoscopy beginning at age 70 years and continuing until age 43 years.  Hepatitis C blood test.** / For all people born from [birth year] through 1963 and any individual with known risks for hepatitis C.  Skin self-exam. / Monthly.  Influenza vaccine. / Every year.  Tetanus, diphtheria, and acellular pertussis (Tdap/Td) vaccine.** / Consult your health care provider. Pregnant women should receive 1 dose of Tdap vaccine during each pregnancy. 1 dose of Td every 10 years.  Varicella vaccine.** / Consult your health care provider. Pregnant females who do not have evidence of immunity should receive the first dose after pregnancy.  Zoster vaccine.** / 1 dose for adults aged 44 years or older.  Measles, mumps, rubella (MMR) vaccine.** / You need at least 1 dose of MMR if you were born in 1957 or later. You may also need a 2nd dose. For females of childbearing age, rubella immunity should be determined. If there is no evidence of immunity,  females who are not pregnant should be vaccinated. If there is no evidence of immunity, females who are pregnant should delay immunization until after pregnancy.  Pneumococcal 13-valent conjugate (PCV13) vaccine.** / Consult your health care provider.  Pneumococcal polysaccharide (PPSV23) vaccine.** / 1 to 2 doses if you smoke cigarettes or if you have certain conditions.  Meningococcal vaccine.** / Consult your health care provider.  Hepatitis A vaccine.** / Consult your health care provider.  Hepatitis B vaccine.** / Consult your health care provider.  Haemophilus influenzae  type b (Hib) vaccine.** / Consult your health care provider. Ages 65 years and over  Blood pressure check.** / Every 1 to 2 years.  Lipid and cholesterol check.** / Every 5 years beginning at age 36 years.  Lung cancer screening. / Every year if you are aged 55-80 years and have a 30-pack-year history of smoking and currently smoke or have quit within the past 15 years. Yearly screening is stopped once you have quit smoking for at least 15 years or develop a health problem that would prevent you from having lung cancer treatment.  Clinical breast exam.** / Every year after age 53 years.  BRCA-related cancer risk assessment.** / For women who have family members with a BRCA-related cancer (breast, ovarian, tubal, or peritoneal cancers).  Mammogram.** / Every year beginning at age 49 years and continuing for as long as you are in good health. Consult with your health care provider.  Pap test.** / Every 3 years starting at age 30 years through age 47 or 33 years with 3 consecutive normal Pap tests. Testing can be stopped between 65 and 70 years with 3 consecutive normal Pap tests and no abnormal Pap or HPV tests in the past 10 years.  HPV screening.** / Every 3 years from ages 24 years through ages 58 or 59 years with a history of 3 consecutive normal Pap tests. Testing can be stopped between 65 and 70 years with 3 consecutive normal Pap tests and no abnormal Pap or HPV tests in the past 10 years.  Fecal occult blood test (FOBT) of stool. / Every year beginning at age 69 years and continuing until age 3 years. You may not need to do this test if you get a colonoscopy every 10 years.  Flexible sigmoidoscopy or colonoscopy.** / Every 5 years for a flexible sigmoidoscopy or every 10 years for a colonoscopy beginning at age 56 years and continuing until age 12 years.  Hepatitis C blood test.** / For all people born from [birth year] through 1963 and any individual with known risks for hepatitis  C.  Osteoporosis screening.** / A one-time screening for women ages 60 years and over and women at risk for fractures or osteoporosis.  Skin self-exam. / Monthly.  Influenza vaccine. / Every year.  Tetanus, diphtheria, and acellular pertussis (Tdap/Td) vaccine.** / 1 dose of Td every 10 years.  Varicella vaccine.** / Consult your health care provider.  Zoster vaccine.** / 1 dose for adults aged 53 years or older.  Pneumococcal 13-valent conjugate (PCV13) vaccine.** / Consult your health care provider.  Pneumococcal polysaccharide (PPSV23) vaccine.** / 1 dose for all adults aged 48 years and older.  Meningococcal vaccine.** / Consult your health care provider.  Hepatitis A vaccine.** / Consult your health care provider.  Hepatitis B vaccine.** / Consult your health care provider.  Haemophilus influenzae type b (Hib) vaccine.** / Consult your health care provider. ** Family history and personal history of risk and conditions may change your health  care provider's recommendations. Document Released: 03/14/2001 Document Revised: 06/02/2013 Document Reviewed: 06/13/2010 [facility] Patient Information 2015 [place], [place]. This information is not intended to replace advice given to you by your health care provider. Make sure you discuss any questions you have with your health care provider.

## 2014-01-07 NOTE — Progress Notes (Signed)
Subjective:    Patient ID: Brittney Cunningham, female    DOB: [birth year], 73 y.o.   MRN: [id number]  HPI   Pt in for awv.   On review of her chart last mammogram done  06-19-2013 and negative.  Last papsmear done years ago. None seen in chart.   Dexascan- I do not see one in the chart.  Last lipid panel doen in 07-04-2103. Ldl was 80. Total cholesterol was 130. Hdl was 22.  BMI- 36 this summer.  Pt had Ct of the chest- showed wide spread metastatic disease. Pt has been treated for this and being followed.  Pt had an upper endoscopy 11-12013. I don't see a colonoscopy. Last colonoscopy done in 1994 per our chart/summary page. Last ekg done 08-2013.  Pt completed pneumovaccine series per our chart in 2010. But she states on 14th she will get vaccine with her oncologist. Influenza vaccine done in 08-2014.         Review of Systems See ros smart set    Objective:   Physical Exam   See pe smart set.       Assessment & Plan:   Subjective:    Brittney Cunningham is a 73 y.o. female who presents for Medicare Annual/Subsequent preventive examination.  Preventive Screening-Counseling & Management  Tobacco History  Smoking status  . Former Smoker -- 1.00 packs/day for 20 years  . Types: Cigarettes  . Quit date: 01/31/1988  Smokeless tobacco  . Never Used     Problems Prior to Visit 1. Breast Cancer. And metastasis. Pt seeing oncologist.  Current Problems (verified) Patient Active Problem List   Diagnosis Date Noted  . Breast cancer, right breast 12/15/2013  . Urinary tract infection, site not specified 09/28/2013  . Nausea with vomiting 09/28/2013  . Diarrhea 09/28/2013  . Hyperkalemia 09/26/2013  . Low TSH level 09/26/2013  . Angina at rest 08/05/2013  . Valvular heart disease 07/11/2013  . Unstable angina 07/08/2013  . CAD (coronary artery disease), native coronary artery 07/06/2013  . Chest pain 07/05/2013  . 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  HYPERKALEMIA 04/23/2013  . 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 Anemia of renal disease 11/11/2012  . Peripheral edema 10/29/2012  . 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 Unsteady gait 12/10/2011  . Need for prophylactic vaccination and inoculation against influenza 11/10/2011  . Breast cancer metastasized to bone 06/16/2011  . CAROTID ARTERY STENOSIS 02/15/2010  . HYPOTHYROIDISM 06/10/2009  . DIABETES MELLITUS, TYPE II 06/10/2009  . HYPERLIPIDEMIA 06/10/2009  . ANEMIA 06/10/2009  . DEPRESSION 06/10/2009  . HYPERTENSION 06/10/2009  . GERD 06/10/2009  . OSTEOARTHRITIS 06/10/2009    Medications Prior to Visit Current Outpatient Prescriptions on File Prior to Visit  Medication Sig Dispense Refill  . aspirin 81 MG chewable tablet Chew 81 mg by mouth daily.    Marland Kitchen atorvastatin (LIPITOR) 80 MG tablet Take 1 tablet (80 mg total) by mouth daily. 30 tablet 6  . Blood Glucose Monitoring Suppl (ACCU-CHEK NANO SMARTVIEW) W/DEVICE KIT Use to check blood sugar twice a day. DX 250.00 1 kit 0  . carvedilol (COREG) 25 MG tablet Take 1 tablet (25 mg total) by mouth 2 (two) times daily with a meal. 60 tablet 6  . clopidogrel (PLAVIX) 75 MG tablet Take 1 tablet (75 mg total) by mouth daily. 30 tablet 8  . cyanocobalamin (,VITAMIN B-12,) 1000 MCG/ML injection Inject 1,000 mcg into the muscle every 30 (thirty) days. Last  dose 03/09/2011    . darbepoetin (ARANESP) 200 MCG/0.4ML SOLN Inject 200 mcg into the skin once. Every 30 days    . glucose blood (ACCU-CHEK AVIVA) test strip Use as instructed to check blood sugar twice a day  Dx 250.00 100 each 3  . glycerin adult (GLYCERIN ADULT) 2 G SUPP Place 1 suppository rectally once as needed (constipation). 10 suppository 0  . hydrochlorothiazide (MICROZIDE) 12.5 MG capsule Take 1 capsule (12.5 mg total) by mouth daily. 30 capsule 3  . insulin glargine (LANTUS) 100 UNIT/ML injection Inject 0.6 mLs (60 Units total) into the  skin at bedtime. Pt wants vials. 30 mL 1  . isosorbide mononitrate (IMDUR) 60 MG 24 hr tablet Take 2 tablets (120 mg total) by mouth daily. 60 tablet 11  . levothyroxine (SYNTHROID, LEVOTHROID) 75 MCG tablet Take 1 tablet (75 mcg total) by mouth daily before breakfast. 30 tablet 1  . metFORMIN (GLUCOPHAGE) 500 MG tablet Take 1 tablet (500 mg total) by mouth every morning. 30 tablet 5  . morphine (MS CONTIN) 30 MG 12 hr tablet Take 1 tablet (30 mg total) by mouth 2 (two) times daily. 60 tablet 0  . nitroGLYCERIN (NITROSTAT) 0.4 MG SL tablet Place 1 tablet (0.4 mg total) under the tongue every 5 (five) minutes as needed for chest pain. 30 tablet 0  . polyethylene glycol (MIRALAX / GLYCOLAX) packet Take 17 g by mouth daily as needed.    . [DISCONTINUED] simvastatin (ZOCOR) 40 MG tablet Take 40 mg by mouth every evening.     No current facility-administered medications on file prior to visit.    Current Medications (verified) Current Outpatient Prescriptions  Medication Sig Dispense Refill  . aspirin 81 MG chewable tablet Chew 81 mg by mouth daily.    Marland Kitchen atorvastatin (LIPITOR) 80 MG tablet Take 1 tablet (80 mg total) by mouth daily. 30 tablet 6  . Blood Glucose Monitoring Suppl (ACCU-CHEK NANO SMARTVIEW) W/DEVICE KIT Use to check blood sugar twice a day. DX 250.00 1 kit 0  . carvedilol (COREG) 25 MG tablet Take 1 tablet (25 mg total) by mouth 2 (two) times daily with a meal. 60 tablet 6  . clopidogrel (PLAVIX) 75 MG tablet Take 1 tablet (75 mg total) by mouth daily. 30 tablet 8  . cyanocobalamin (,VITAMIN B-12,) 1000 MCG/ML injection Inject 1,000 mcg into the muscle every 30 (thirty) days. Last dose 03/09/2011    . darbepoetin (ARANESP) 200 MCG/0.4ML SOLN Inject 200 mcg into the skin once. Every 30 days    . glucose blood (ACCU-CHEK AVIVA) test strip Use as instructed to check blood sugar twice a day  Dx 250.00 100 each 3  . glycerin adult (GLYCERIN ADULT) 2 G SUPP Place 1 suppository rectally once  as needed (constipation). 10 suppository 0  . hydrochlorothiazide (MICROZIDE) 12.5 MG capsule Take 1 capsule (12.5 mg total) by mouth daily. 30 capsule 3  . insulin glargine (LANTUS) 100 UNIT/ML injection Inject 0.6 mLs (60 Units total) into the skin at bedtime. Pt wants vials. 30 mL 1  . isosorbide mononitrate (IMDUR) 60 MG 24 hr tablet Take 2 tablets (120 mg total) by mouth daily. 60 tablet 11  . levothyroxine (SYNTHROID, LEVOTHROID) 75 MCG tablet Take 1 tablet (75 mcg total) by mouth daily before breakfast. 30 tablet 1  . metFORMIN (GLUCOPHAGE) 500 MG tablet Take 1 tablet (500 mg total) by mouth every morning. 30 tablet 5  . morphine (MS CONTIN) 30 MG 12 hr tablet Take 1 tablet (  30 mg total) by mouth 2 (two) times daily. 60 tablet 0  . nitroGLYCERIN (NITROSTAT) 0.4 MG SL tablet Place 1 tablet (0.4 mg total) under the tongue every 5 (five) minutes as needed for chest pain. 30 tablet 0  . polyethylene glycol (MIRALAX / GLYCOLAX) packet Take 17 g by mouth daily as needed.    . [DISCONTINUED] simvastatin (ZOCOR) 40 MG tablet Take 40 mg by mouth every evening.     No current facility-administered medications for this visit.     Allergies (verified) Chlorhexidine; Adhesive; and Codeine   PAST HISTORY  Family History Family History  Problem Relation Age of Onset  . Arthritis    . Coronary artery disease      first degree relative  . Stroke      first degree relative  . Diabetes Mother   . Heart disease Mother   . Hypertension Mother   . Cancer Mother   . Deep vein thrombosis Mother   . Colon cancer Neg Hx   . Stomach cancer Neg Hx   . Cancer Father     Pancreatic cancer  . Breast cancer Paternal Aunt 3    Social History History  Substance Use Topics  . Smoking status: Former Smoker -- 1.00 packs/day for 20 years    Types: Cigarettes    Quit date: 01/31/1988  . Smokeless tobacco: Never Used  . Alcohol Use: No     Are there smokers in your home (other than you)? No  Risk  Factors Current exercise habits: Pt not exercising. Knees and leg prohibit. Arthritis.  Dietary issues discussed: Constipation from morphine.   Cardiac risk factors: Pt has diabetes, hyperlipidemia, age above. CAD. FH mom stroke. Used to smoke when younger.  Depression Screen (Note: if answer to either of the following is "Yes", a more complete depression screening is indicated)   Over the past two weeks, have you felt down, depressed or hopeless? No  Over the past two weeks, have you felt little interest or pleasure in doing things? No  Have you lost interest or pleasure in daily life? No  Do you often feel hopeless? Yes. Sometimes but she tries to be optomistic despite her diagnosis.  Do you cry easily over simple problems? Yes. Sometimes. Pt decided about 4 years ago she decided to stop antidepressants.  Activities of Daily Living In your present state of health, do you have any difficulty performing the following activities?:  Driving? No Managing money?  No Feeding yourself? No Getting from bed to chair? Pt can get out of bed and make to chair no problem. Climbing a flight of stairs? No.But painful due to arthritis. Preparing food and eating?: No Bathing or showering? No Getting dressed: No Getting to the toilet? No Using the toilet:No Moving around from place to place: No In the past year have you fallen or had a near fall?:No   Are you sexually active?  No  Do you have more than one partner?  No  Hearing Difficulties: No Do you often ask people to speak up or repeat themselves? No Do you experience ringing or noises in your ears? No Do you have difficulty understanding soft or whispered voices? No   Do you feel that you have a problem with memory? Yes.Rare/sometimes.  Do you often misplace items? Yes. But she always finds the items.  Do you feel safe at home?  Yes  Cognitive Testing  Alert? Yes  Normal Appearance?Yes  Oriented to person? Yes  Place?  Yes   Time?  Yes  Recall of three objects?  Yes  Can perform simple calculations? Yes  Displays appropriate judgment?Yes  Can read the correct time from a watch face?Yes   Advanced Directives have been discussed with the patient? No.   List the Names of Other Physician/Practitioners you currently use: 1.  Dr. Roswell Miners, Dr. Vena Austria oncologist. Cardiologist MD Peter [place].  Indicate any recent Medical Services you may have received from other than [facility] providers in the past year (date may be approximate).  Immunization History  Administered Date(s) Administered  . Influenza Split 11/10/2011  . Influenza Whole 10/12/2008, 10/12/2009  . Influenza,inj,Quad PF,36+ Mos 10/21/2013  . Influenza-Unspecified 10/30/2012  . Pneumococcal Polysaccharide-23 10/12/2008    Screening Tests Health Maintenance  Topic Date Due  . OPHTHALMOLOGY EXAM  09/07/1952  . TETANUS/TDAP  09/07/1961  . COLONOSCOPY  09/07/1992  . ZOSTAVAX  09/08/2002  . FOOT EXAM  06/11/2010  . URINE MICROALBUMIN  02/08/2012  . HEMOGLOBIN A1C  01/06/2014  . INFLUENZA VACCINE  08/31/2014  . MAMMOGRAM  06/20/2015  . PNEUMOCOCCAL POLYSACCHARIDE VACCINE AGE 78 AND OVER  Completed    All answers were reviewed with the patient and necessary referrals were made:  Esperanza Richters, PA-C   01/07/2014   History reviewed:  She  has a past medical history of Anemia; Depression; Diabetes mellitus type II; GERD (gastroesophageal reflux disease); Hyperlipidemia; Hypothyroidism; Osteoarthritis; Carotid artery stenosis; CAD (coronary artery disease); OSA (obstructive sleep apnea); Breast cancer; Hypertension; Anxiety; Carotid artery occlusion; Unspecified constipation (03/15/2012); Bursitis; Collagen vascular disease; History of radiation therapy (10/31/12-11/13/12); Valvular heart disease (07/11/2013); and Diarrhea (09/28/2013). She  does not have any pertinent problems on file. She  has past surgical history that includes Incisional breast biopsy;  Cesarean section; Other surgical history; Knee surgery; Carotid endarterectomy; Port-a-cath removal; Modified mastectomy; Breast surgery; Mastectomy; Carotid stent; and Cardiac catheterization (06/2013). Her family history includes Arthritis in an other family member; Breast cancer (age of onset: 80) in her paternal aunt; Cancer in her father and mother; Coronary artery disease in an other family member; Deep vein thrombosis in her mother; Diabetes in her mother; Heart disease in her mother; Hypertension in her mother; Stroke in an other family member. There is no history of Colon cancer or Stomach cancer. She  reports that she quit smoking about 25 years ago. Her smoking use included Cigarettes. She has a 20 pack-year smoking history. She has never used smokeless tobacco. She reports that she does not drink alcohol or use illicit drugs. She has a current medication list which includes the following prescription(s): aspirin, atorvastatin, accu-chek nano smartview, carvedilol, clopidogrel, cyanocobalamin, darbepoetin, glucose blood, glycerin adult, hydrochlorothiazide, insulin glargine, isosorbide mononitrate, levothyroxine, metformin, morphine, nitroglycerin, and polyethylene glycol. Current Outpatient Prescriptions on File Prior to Visit  Medication Sig Dispense Refill  . aspirin 81 MG chewable tablet Chew 81 mg by mouth daily.    Marland Kitchen atorvastatin (LIPITOR) 80 MG tablet Take 1 tablet (80 mg total) by mouth daily. 30 tablet 6  . Blood Glucose Monitoring Suppl (ACCU-CHEK NANO SMARTVIEW) W/DEVICE KIT Use to check blood sugar twice a day. DX 250.00 1 kit 0  . carvedilol (COREG) 25 MG tablet Take 1 tablet (25 mg total) by mouth 2 (two) times daily with a meal. 60 tablet 6  . clopidogrel (PLAVIX) 75 MG tablet Take 1 tablet (75 mg total) by mouth daily. 30 tablet 8  . cyanocobalamin (,VITAMIN B-12,) 1000 MCG/ML injection Inject 1,000 mcg into the muscle every  30 (thirty) days. Last dose 03/09/2011    . darbepoetin  (ARANESP) 200 MCG/0.4ML SOLN Inject 200 mcg into the skin once. Every 30 days    . glucose blood (ACCU-CHEK AVIVA) test strip Use as instructed to check blood sugar twice a day  Dx 250.00 100 each 3  . glycerin adult (GLYCERIN ADULT) 2 G SUPP Place 1 suppository rectally once as needed (constipation). 10 suppository 0  . hydrochlorothiazide (MICROZIDE) 12.5 MG capsule Take 1 capsule (12.5 mg total) by mouth daily. 30 capsule 3  . insulin glargine (LANTUS) 100 UNIT/ML injection Inject 0.6 mLs (60 Units total) into the skin at bedtime. Pt wants vials. 30 mL 1  . isosorbide mononitrate (IMDUR) 60 MG 24 hr tablet Take 2 tablets (120 mg total) by mouth daily. 60 tablet 11  . levothyroxine (SYNTHROID, LEVOTHROID) 75 MCG tablet Take 1 tablet (75 mcg total) by mouth daily before breakfast. 30 tablet 1  . metFORMIN (GLUCOPHAGE) 500 MG tablet Take 1 tablet (500 mg total) by mouth every morning. 30 tablet 5  . morphine (MS CONTIN) 30 MG 12 hr tablet Take 1 tablet (30 mg total) by mouth 2 (two) times daily. 60 tablet 0  . nitroGLYCERIN (NITROSTAT) 0.4 MG SL tablet Place 1 tablet (0.4 mg total) under the tongue every 5 (five) minutes as needed for chest pain. 30 tablet 0  . polyethylene glycol (MIRALAX / GLYCOLAX) packet Take 17 g by mouth daily as needed.    . [DISCONTINUED] simvastatin (ZOCOR) 40 MG tablet Take 40 mg by mouth every evening.     No current facility-administered medications on file prior to visit.   She is allergic to chlorhexidine; adhesive; and codeine.  Review of Systems A comprehensive review of systems was negative. Except for fatigue and some bone pain from the cancer.   Objective:    Vision by Snellen chart: right CWU:GQBVQXI declines measurement, left HWT:[id number] declines measurement . Pt plans to do eye check after the new year.  There is no weight on file to calculate BMI. LMP 03/13/2012  General   Mental Status- Alert.  Orientation-Oriented x3. Build and Nutrition Well  Nourished and Well Developed.  Skin General: Normal.  Color- Normal color. Moisture- Dry.Temperature warm. Lesions: No suspicious lesions  Head, Eyes, Ears, Nose, Thoat Ears-Normal. Auditory Canal-Bilateral-Normal. Tympanic Membrane- Bilateral-Normal. Eyes Fundi- Bilateral-Normal. Pupil- Bilateral- Direct reaction to light normal. Nose & Sinuses- Normal. Nostril- Bilateral-Normal.  Neck Neck- No Bruits or Masses. Thyroid- Normal. No thyromegaly or nodules.   Chest and Lung Exam  Percussion: Quality and Intensity:-Percussion normal. Percussion of chest reveals- No Dullness. Palpation of the chest reveals- Non-tender. Auscultation: Breath sounds-Normal. Adventitious  Sounds:No adventitious    Cardiovascular Inspection: No Heaves. Auscultation: Heart Sounds- Normal sinus rhythm without murmur or gallop, S1 WNL and S2 WNL.  Abdomen Inspection:- Inspection Normal. Inspection of abdomen reveals- No Hernias. Palpation/Percussion: Palpation and Percussion of the abdomen reveal- Non Tender and No Palpable masses. Liver: Other Characteristics- No Hepatmegaly Spleen:Other Characteristics- No Splenomegaly. Auscultation: Auscultation of the abdomen reveals-Bowel sounds normal and No Abdominal bruits.    Rectal Pt declined but will give hemocult.  Neurologic Mental Status- Normal Cranial Nerves- Normal Bilaterally, Motor- Normal. Strength:5/5 normal muscle strength- All Muscles. General Assessment of Reflexes- Right Knee- 2+. Left Knee- 2+. Coordination- Normal. Gait- Normal. Meningeal Signs- None.  Musculoskeletal Global Assessment General- Joints show full range of motion without obvious deformity and Normal muscle mass. Strength 5/5 in upper and lower extremities.  Lymphatic General lymphatics Description-No  Generalized lymphadenopathy.       Assessment:   Pt states will get 2nd  pneumo vaccine with her oncologist this month on 14th. Our chart states she is up to  date. Will give hemocult cards today since she never had colnoscopy(which she declines due to her medical problems). Pt declined bone scan today. She had side effects from meds for osteoporosis in the past.  Pt will see optometrist in January.        Plan:    During the course of the visit the patient was educated and counseled about appropriate screening and preventive services including:    This is a routine wellness  examination for this patient . I reviewed all health maintenance protocols including mammography, colonoscopy, bone density Needed referrals were placed. Age and diagnosis  appropriate screening labs were ordered. Her immunization history was reviewed and appropriate vaccinations were ordered. Her current medications and allergies were reviewed and needed refills of her chronic medications were ordered. The plan for yearly health maintenance was discussed all orders and referrals were made as appropriate.  Diet review for nutrition referral? Yes ____  Not Indicated ___x_   Patient Instructions (the written plan) was given to the patient.  Medicare Attestation I have personally reviewed: The patient's medical and social history Their use of alcohol, tobacco or illicit drugs Their current medications and supplements The patient's functional ability including ADLs,fall risks, home safety risks, cognitive, and hearing and visual impairment Diet and physical activities Evidence for depression or mood disorders  The patient's weight, height, BMI, and visual acuity have been recorded in the chart.  I have made referrals, counseling, and provided education to the patient based on review of the above and I have provided the patient with a written personalized care plan for preventive services.     Mackie Pai, PA-C   01/07/2014

## 2014-01-07 NOTE — Progress Notes (Signed)
Pre visit review using our clinic review tool, if applicable. No additional management support is needed unless otherwise documented below in the visit note. 

## 2014-01-08 ENCOUNTER — Encounter (HOSPITAL_COMMUNITY): Payer: Self-pay | Admitting: Vascular Surgery

## 2014-01-09 ENCOUNTER — Other Ambulatory Visit: Payer: Self-pay | Admitting: *Deleted

## 2014-01-09 DIAGNOSIS — C50919 Malignant neoplasm of unspecified site of unspecified female breast: Secondary | ICD-10-CM

## 2014-01-09 DIAGNOSIS — C7951 Secondary malignant neoplasm of bone: Principal | ICD-10-CM

## 2014-01-12 ENCOUNTER — Other Ambulatory Visit: Payer: Self-pay | Admitting: *Deleted

## 2014-01-12 ENCOUNTER — Other Ambulatory Visit (HOSPITAL_BASED_OUTPATIENT_CLINIC_OR_DEPARTMENT_OTHER): Payer: Medicare Other

## 2014-01-12 ENCOUNTER — Ambulatory Visit (HOSPITAL_BASED_OUTPATIENT_CLINIC_OR_DEPARTMENT_OTHER): Payer: Medicare Other

## 2014-01-12 DIAGNOSIS — K219 Gastro-esophageal reflux disease without esophagitis: Secondary | ICD-10-CM

## 2014-01-12 DIAGNOSIS — C50919 Malignant neoplasm of unspecified site of unspecified female breast: Secondary | ICD-10-CM

## 2014-01-12 DIAGNOSIS — Z5111 Encounter for antineoplastic chemotherapy: Secondary | ICD-10-CM

## 2014-01-12 DIAGNOSIS — C50611 Malignant neoplasm of axillary tail of right female breast: Secondary | ICD-10-CM

## 2014-01-12 DIAGNOSIS — Z23 Encounter for immunization: Secondary | ICD-10-CM

## 2014-01-12 DIAGNOSIS — C7951 Secondary malignant neoplasm of bone: Secondary | ICD-10-CM

## 2014-01-12 DIAGNOSIS — Z95828 Presence of other vascular implants and grafts: Secondary | ICD-10-CM

## 2014-01-12 DIAGNOSIS — E538 Deficiency of other specified B group vitamins: Secondary | ICD-10-CM

## 2014-01-12 DIAGNOSIS — Z452 Encounter for adjustment and management of vascular access device: Secondary | ICD-10-CM

## 2014-01-12 DIAGNOSIS — E039 Hypothyroidism, unspecified: Secondary | ICD-10-CM

## 2014-01-12 DIAGNOSIS — D649 Anemia, unspecified: Secondary | ICD-10-CM

## 2014-01-12 LAB — CBC WITH DIFFERENTIAL/PLATELET
BASO%: 1.1 % (ref 0.0–2.0)
Basophils Absolute: 0.1 10*3/uL (ref 0.0–0.1)
EOS%: 3.9 % (ref 0.0–7.0)
Eosinophils Absolute: 0.2 10*3/uL (ref 0.0–0.5)
HCT: 29.6 % — ABNORMAL LOW (ref 34.8–46.6)
HGB: 9.4 g/dL — ABNORMAL LOW (ref 11.6–15.9)
LYMPH#: 1.7 10*3/uL (ref 0.9–3.3)
LYMPH%: 29 % (ref 14.0–49.7)
MCH: 29.3 pg (ref 25.1–34.0)
MCHC: 31.8 g/dL (ref 31.5–36.0)
MCV: 92.3 fL (ref 79.5–101.0)
MONO#: 0.5 10*3/uL (ref 0.1–0.9)
MONO%: 8.5 % (ref 0.0–14.0)
NEUT#: 3.3 10*3/uL (ref 1.5–6.5)
NEUT%: 57.5 % (ref 38.4–76.8)
Platelets: 277 10*3/uL (ref 145–400)
RBC: 3.21 10*6/uL — ABNORMAL LOW (ref 3.70–5.45)
RDW: 17.2 % — AB (ref 11.2–14.5)
WBC: 5.7 10*3/uL (ref 3.9–10.3)

## 2014-01-12 LAB — COMPREHENSIVE METABOLIC PANEL (CC13)
ALBUMIN: 3.2 g/dL — AB (ref 3.5–5.0)
ALK PHOS: 145 U/L (ref 40–150)
ALT: 11 U/L (ref 0–55)
AST: 19 U/L (ref 5–34)
Anion Gap: 14 mEq/L — ABNORMAL HIGH (ref 3–11)
BUN: 29.9 mg/dL — ABNORMAL HIGH (ref 7.0–26.0)
CHLORIDE: 102 meq/L (ref 98–109)
CO2: 20 mEq/L — ABNORMAL LOW (ref 22–29)
CREATININE: 1.3 mg/dL — AB (ref 0.6–1.1)
Calcium: 8.8 mg/dL (ref 8.4–10.4)
EGFR: 42 mL/min/{1.73_m2} — ABNORMAL LOW (ref >90)
Glucose: 162 mg/dl — ABNORMAL HIGH (ref 70–140)
POTASSIUM: 4.7 meq/L (ref 3.5–5.1)
Sodium: 136 mEq/L (ref 136–145)
Total Bilirubin: 0.3 mg/dL (ref 0.20–1.20)
Total Protein: 6.8 g/dL (ref 6.4–8.3)

## 2014-01-12 MED ORDER — FULVESTRANT 250 MG/5ML IM SOLN
500.0000 mg | Freq: Once | INTRAMUSCULAR | Status: AC
Start: 2014-01-12 — End: 2014-01-12
  Administered 2014-01-12: 500 mg via INTRAMUSCULAR
  Filled 2014-01-12: qty 10

## 2014-01-12 MED ORDER — LEVOTHYROXINE SODIUM 75 MCG PO TABS: 75.0000 ug | ORAL_TABLET | Freq: Every day | ORAL | Status: DC

## 2014-01-12 MED ORDER — HEPARIN SOD (PORK) LOCK FLUSH 100 UNIT/ML IV SOLN
500.0000 [IU] | Freq: Once | INTRAVENOUS | Status: AC
  Administered 2014-01-12: 500 [IU] via INTRAVENOUS
  Filled 2014-01-12: qty 5

## 2014-01-12 MED ORDER — DARBEPOETIN ALFA 200 MCG/0.4ML IJ SOSY
200.0000 ug | PREFILLED_SYRINGE | Freq: Once | INTRAMUSCULAR | Status: AC
  Administered 2014-01-12: 200 ug via SUBCUTANEOUS
  Filled 2014-01-12: qty 0.4

## 2014-01-12 MED ORDER — CYANOCOBALAMIN 1000 MCG/ML IJ SOLN
1000.0000 ug | Freq: Once | INTRAMUSCULAR | Status: AC
  Administered 2014-01-12: 1000 ug via INTRAMUSCULAR

## 2014-01-12 MED ORDER — SODIUM CHLORIDE 0.9 % IJ SOLN
10.0000 mL | INTRAMUSCULAR | Status: DC | PRN
  Administered 2014-01-12: 10 mL via INTRAVENOUS
  Filled 2014-01-12: qty 10

## 2014-01-12 MED ORDER — PNEUMOCOCCAL VAC POLYVALENT 25 MCG/0.5ML IJ INJ
0.5000 mL | INJECTION | Freq: Once | INTRAMUSCULAR | Status: AC
  Administered 2014-01-12: 0.5 mL via INTRAMUSCULAR
  Filled 2014-01-12: qty 0.5

## 2014-01-12 NOTE — Patient Instructions (Signed)
Implanted Port Home Guide An implanted port is a type of central line that is placed under the skin. Central lines are used to provide IV access when treatment or nutrition needs to be given through a person's veins. Implanted ports are used for long-term IV access. An implanted port may be placed because:   You need IV medicine that would be irritating to the small veins in your hands or arms.   You need long-term IV medicines, such as antibiotics.   You need IV nutrition for a long period.   You need frequent blood draws for lab tests.   You need dialysis.  Implanted ports are usually placed in the chest area, but they can also be placed in the upper arm, the abdomen, or the leg. An implanted port has two main parts:   Reservoir. The reservoir is round and will appear as a small, raised area under your skin. The reservoir is the part where a needle is inserted to give medicines or draw blood.   Catheter. The catheter is a thin, flexible tube that extends from the reservoir. The catheter is placed into a large vein. Medicine that is inserted into the reservoir goes into the catheter and then into the vein.  HOW WILL I CARE FOR MY INCISION SITE? Do not get the incision site wet. Bathe or shower as directed by your health care provider.  HOW IS MY PORT ACCESSED? Special steps must be taken to access the port:   Before the port is accessed, a numbing cream can be placed on the skin. This helps numb the skin over the port site.   Your health care provider uses a sterile technique to access the port.  Your health care provider must put on a mask and sterile gloves.  The skin over your port is cleaned carefully with an antiseptic and allowed to dry.  The port is gently pinched between sterile gloves, and a needle is inserted into the port.  Only "non-coring" port needles should be used to access the port. Once the port is accessed, a blood return should be checked. This helps  ensure that the port is in the vein and is not clogged.   If your port needs to remain accessed for a constant infusion, a clear (transparent) bandage will be placed over the needle site. The bandage and needle will need to be changed every week, or as directed by your health care provider.   Keep the bandage covering the needle clean and dry. Do not get it wet. Follow your health care provider's instructions on how to take a shower or bath while the port is accessed.   If your port does not need to stay accessed, no bandage is needed over the port.  WHAT IS FLUSHING? Flushing helps keep the port from getting clogged. Follow your health care provider's instructions on how and when to flush the port. Ports are usually flushed with saline solution or a medicine called heparin. The need for flushing will depend on how the port is used.   If the port is used for intermittent medicines or blood draws, the port will need to be flushed:   After medicines have been given.   After blood has been drawn.   As part of routine maintenance.   If a constant infusion is running, the port may not need to be flushed.  HOW LONG WILL MY PORT STAY IMPLANTED? The port can stay in for as long as your health care   provider thinks it is needed. When it is time for the port to come out, surgery will be done to remove it. The procedure is similar to the one performed when the port was put in.  WHEN SHOULD I SEEK IMMEDIATE MEDICAL CARE? When you have an implanted port, you should seek immediate medical care if:   You notice a bad smell coming from the incision site.   You have swelling, redness, or drainage at the incision site.   You have more swelling or pain at the port site or the surrounding area.   You have a fever that is not controlled with medicine. Document Released: 01/16/2005 Document Revised: 11/06/2012 Document Reviewed: 09/23/2012 [facility] Patient Information 2015 [place], [place]. This  information is not intended to replace advice given to you by your health care provider. Make sure you discuss any questions you have with your health care provider. Cyanocobalamin, Vitamin B12 injection What is this medicine? CYANOCOBALAMIN (sye an oh koe BAL a min) is a man made form of vitamin B12. Vitamin B12 is used in the growth of healthy blood cells, nerve cells, and proteins in the body. It also helps with the metabolism of fats and carbohydrates. This medicine is used to treat people who can not absorb vitamin B12. This medicine may be used for other purposes; ask your health care provider or pharmacist if you have questions. COMMON BRAND NAME(S): Cyomin, LA-12, Nutri-Twelve, Primabalt What should I tell my health care provider before I take this medicine? They need to know if you have any of these conditions: -kidney disease -Leber's disease -megaloblastic anemia -an unusual or allergic reaction to cyanocobalamin, cobalt, other medicines, foods, dyes, or preservatives -pregnant or trying to get pregnant -breast-feeding How should I use this medicine? This medicine is injected into a muscle or deeply under the skin. It is usually given by a health care professional in a clinic or doctor's office. However, your doctor may teach you how to inject yourself. Follow all instructions. Talk to your pediatrician regarding the use of this medicine in children. Special care may be needed. Overdosage: If you think you have taken too much of this medicine contact a poison control center or emergency room at once. NOTE: This medicine is only for you. Do not share this medicine with others. What if I miss a dose? If you are given your dose at a clinic or doctor's office, call to reschedule your appointment. If you give your own injections and you miss a dose, take it as soon as you can. If it is almost time for your next dose, take only that dose. Do not take double or extra doses. What may interact  with this medicine? -colchicine -heavy alcohol intake This list may not describe all possible interactions. Give your health care provider a list of all the medicines, herbs, non-prescription drugs, or dietary supplements you use. Also tell them if you smoke, drink alcohol, or use illegal drugs. Some items may interact with your medicine. What should I watch for while using this medicine? Visit your doctor or health care professional regularly. You may need blood work done while you are taking this medicine. You may need to follow a special diet. Talk to your doctor. Limit your alcohol intake and avoid smoking to get the best benefit. What side effects may I notice from receiving this medicine? Side effects that you should report to your doctor or health care professional as soon as possible: -allergic reactions like skin rash, itching or  hives, swelling of the face, lips, or tongue -blue tint to skin -chest tightness, pain -difficulty breathing, wheezing -dizziness -red, swollen painful area on the leg Side effects that usually do not require medical attention (report to your doctor or health care professional if they continue or are bothersome): -diarrhea -headache This list may not describe all possible side effects. Call your doctor for medical advice about side effects. You may report side effects to FDA at 1-800-FDA-1088. Where should I keep my medicine? Keep out of the reach of children. Store at room temperature between 15 and 30 degrees C (59 and 85 degrees F). Protect from light. Throw away any unused medicine after the expiration date. NOTE: This sheet is a summary. It may not cover all possible information. If you have questions about this medicine, talk to your doctor, pharmacist, or health care provider.  2015, [employer] Standard. (2007-04-29 22:10:20) Fulvestrant injection What is this medicine? FULVESTRANT (ful VES trant) blocks the effects of estrogen. It is used to  treat breast cancer in women past the age of menopause. This medicine may be used for other purposes; ask your health care provider or pharmacist if you have questions. COMMON BRAND NAME(S): FASLODEX What should I tell my health care provider before I take this medicine? They need to know if you have any of these conditions: -bleeding problems -liver disease -low levels of platelets in the blood -an unusual or allergic reaction to fulvestrant, other medicines, foods, dyes, or preservatives -pregnant or trying to get pregnant -breast-feeding How should I use this medicine? This medicine is for injection into a muscle. It is usually given by a health care professional in a hospital or clinic setting. Talk to your pediatrician regarding the use of this medicine in children. Special care may be needed. Overdosage: If you think you have taken too much of this medicine contact a poison control center or emergency room at once. NOTE: This medicine is only for you. Do not share this medicine with others. What if I miss a dose? It is important not to miss your dose. Call your doctor or health care professional if you are unable to keep an appointment. What may interact with this medicine? -medicines that treat or prevent blood clots like warfarin, enoxaparin, and dalteparin This list may not describe all possible interactions. Give your health care provider a list of all the medicines, herbs, non-prescription drugs, or dietary supplements you use. Also tell them if you smoke, drink alcohol, or use illegal drugs. Some items may interact with your medicine. What should I watch for while using this medicine? Your condition will be monitored carefully while you are receiving this medicine. You will need important blood work done while you are taking this medicine. Do not become pregnant while taking this medicine. Women should inform their doctor if they wish to become pregnant or think they might be  pregnant. There is a potential for serious side effects to an unborn child. Talk to your health care professional or pharmacist for more information. What side effects may I notice from receiving this medicine? Side effects that you should report to your doctor or health care professional as soon as possible: -allergic reactions like skin rash, itching or hives, swelling of the face, lips, or tongue -feeling faint or lightheaded, falls -fever or flu-like symptoms -sore throat -vaginal bleeding Side effects that usually do not require medical attention (report to your doctor or health care professional if they continue or are bothersome): -aches, pains -constipation  or diarrhea -headache -hot flashes -nausea, vomiting -pain at site where injected -stomach pain This list may not describe all possible side effects. Call your doctor for medical advice about side effects. You may report side effects to FDA at 1-800-FDA-1088. Where should I keep my medicine? This drug is given in a hospital or clinic and will not be stored at home. NOTE: This sheet is a summary. It may not cover all possible information. If you have questions about this medicine, talk to your doctor, pharmacist, or health care provider.  2015, [employer] Standard. (2007-05-27 15:39:24) Darbepoetin Alfa injection What is this medicine? DARBEPOETIN ALFA (dar be POE e tin AL fa) helps your body make more red blood cells. It is used to treat anemia caused by chronic kidney failure and chemotherapy. This medicine may be used for other purposes; ask your health care provider or pharmacist if you have questions. COMMON BRAND NAME(S): Aranesp What should I tell my health care provider before I take this medicine? They need to know if you have any of these conditions: -blood clotting disorders or history of blood clots -cancer patient not on chemotherapy -cystic fibrosis -heart disease, such as angina, heart failure, or a history  of a heart attack -hemoglobin level of 12 g/dL or greater -high blood pressure -low levels of folate, iron, or vitamin B12 -seizures -an unusual or allergic reaction to darbepoetin, erythropoietin, albumin, hamster proteins, latex, other medicines, foods, dyes, or preservatives -pregnant or trying to get pregnant -breast-feeding How should I use this medicine? This medicine is for injection into a vein or under the skin. It is usually given by a health care professional in a hospital or clinic setting. If you get this medicine at home, you will be taught how to prepare and give this medicine. Do not shake the solution before you withdraw a dose. Use exactly as directed. Take your medicine at regular intervals. Do not take your medicine more often than directed. It is important that you put your used needles and syringes in a special sharps container. Do not put them in a trash can. If you do not have a sharps container, call your pharmacist or healthcare provider to get one. Talk to your pediatrician regarding the use of this medicine in children. While this medicine may be used in children as young as 1 year for selected conditions, precautions do apply. Overdosage: If you think you have taken too much of this medicine contact a poison control center or emergency room at once. NOTE: This medicine is only for you. Do not share this medicine with others. What if I miss a dose? If you miss a dose, take it as soon as you can. If it is almost time for your next dose, take only that dose. Do not take double or extra doses. What may interact with this medicine? Do not take this medicine with any of the following medications: -epoetin alfa This list may not describe all possible interactions. Give your health care provider a list of all the medicines, herbs, non-prescription drugs, or dietary supplements you use. Also tell them if you smoke, drink alcohol, or use illegal drugs. Some items may interact  with your medicine. What should I watch for while using this medicine? Visit your prescriber or health care professional for regular checks on your progress and for the needed blood tests and blood pressure measurements. It is especially important for the doctor to make sure your hemoglobin level is in the desired range, to limit  the risk of potential side effects and to give you the best benefit. Keep all appointments for any recommended tests. Check your blood pressure as directed. Ask your doctor what your blood pressure should be and when you should contact him or her. As your body makes more red blood cells, you may need to take iron, folic acid, or vitamin B supplements. Ask your doctor or health care provider which products are right for you. If you have kidney disease continue dietary restrictions, even though this medication can make you feel better. Talk with your doctor or health care professional about the foods you eat and the vitamins that you take. What side effects may I notice from receiving this medicine? Side effects that you should report to your doctor or health care professional as soon as possible: -allergic reactions like skin rash, itching or hives, swelling of the face, lips, or tongue -breathing problems -changes in vision -chest pain -confusion, trouble speaking or understanding -feeling faint or lightheaded, falls -high blood pressure -muscle aches or pains -pain, swelling, warmth in the leg -rapid weight gain -severe headaches -sudden numbness or weakness of the face, arm or leg -trouble walking, dizziness, loss of balance or coordination -seizures (convulsions) -swelling of the ankles, feet, hands -unusually weak or tired Side effects that usually do not require medical attention (report to your doctor or health care professional if they continue or are bothersome): -diarrhea -fever, chills (flu-like symptoms) -headaches -nausea, vomiting -redness, stinging,  or swelling at site where injected This list may not describe all possible side effects. Call your doctor for medical advice about side effects. You may report side effects to FDA at 1-800-FDA-1088. Where should I keep my medicine? Keep out of the reach of children. Store in a refrigerator between 2 and 8 degrees C (36 and 46 degrees F). Do not freeze. Do not shake. Throw away any unused portion if using a single-dose vial. Throw away any unused medicine after the expiration date. NOTE: This sheet is a summary. It may not cover all possible information. If you have questions about this medicine, talk to your doctor, pharmacist, or health care provider.  2015, [employer] Standard. (2007-12-31 10:23:57)

## 2014-01-13 ENCOUNTER — Other Ambulatory Visit: Payer: Self-pay | Admitting: Emergency Medicine

## 2014-01-13 DIAGNOSIS — C50911 Malignant neoplasm of unspecified site of right female breast: Secondary | ICD-10-CM

## 2014-01-13 DIAGNOSIS — C7951 Secondary malignant neoplasm of bone: Principal | ICD-10-CM

## 2014-01-13 MED ORDER — MORPHINE SULFATE ER 30 MG PO TBCR: 30.0000 mg | EXTENDED_RELEASE_TABLET | Freq: Two times a day (BID) | ORAL | Status: DC

## 2014-02-06 ENCOUNTER — Other Ambulatory Visit: Payer: Self-pay | Admitting: *Deleted

## 2014-02-06 DIAGNOSIS — C50919 Malignant neoplasm of unspecified site of unspecified female breast: Secondary | ICD-10-CM

## 2014-02-06 DIAGNOSIS — C7951 Secondary malignant neoplasm of bone: Principal | ICD-10-CM

## 2014-02-09 ENCOUNTER — Other Ambulatory Visit (HOSPITAL_BASED_OUTPATIENT_CLINIC_OR_DEPARTMENT_OTHER): Payer: Medicare Other

## 2014-02-09 ENCOUNTER — Ambulatory Visit (HOSPITAL_BASED_OUTPATIENT_CLINIC_OR_DEPARTMENT_OTHER): Payer: Medicare Other

## 2014-02-09 VITALS — BP 142/52 | HR 60 | Temp 98.1°F

## 2014-02-09 DIAGNOSIS — C50919 Malignant neoplasm of unspecified site of unspecified female breast: Secondary | ICD-10-CM

## 2014-02-09 DIAGNOSIS — C7951 Secondary malignant neoplasm of bone: Secondary | ICD-10-CM

## 2014-02-09 DIAGNOSIS — E538 Deficiency of other specified B group vitamins: Secondary | ICD-10-CM | POA: Diagnosis not present

## 2014-02-09 DIAGNOSIS — C50911 Malignant neoplasm of unspecified site of right female breast: Secondary | ICD-10-CM

## 2014-02-09 DIAGNOSIS — Z5111 Encounter for antineoplastic chemotherapy: Secondary | ICD-10-CM | POA: Diagnosis not present

## 2014-02-09 DIAGNOSIS — C50611 Malignant neoplasm of axillary tail of right female breast: Secondary | ICD-10-CM | POA: Diagnosis not present

## 2014-02-09 DIAGNOSIS — D649 Anemia, unspecified: Secondary | ICD-10-CM

## 2014-02-09 LAB — CBC WITH DIFFERENTIAL/PLATELET
BASO%: 0.8 % (ref 0.0–2.0)
BASOS ABS: 0 10*3/uL (ref 0.0–0.1)
EOS ABS: 0.2 10*3/uL (ref 0.0–0.5)
EOS%: 2.9 % (ref 0.0–7.0)
HCT: 29.8 % — ABNORMAL LOW (ref 34.8–46.6)
HGB: 9.4 g/dL — ABNORMAL LOW (ref 11.6–15.9)
LYMPH%: 31 % (ref 14.0–49.7)
MCH: 29.7 pg (ref 25.1–34.0)
MCHC: 31.7 g/dL (ref 31.5–36.0)
MCV: 93.5 fL (ref 79.5–101.0)
MONO#: 0.5 10*3/uL (ref 0.1–0.9)
MONO%: 8.7 % (ref 0.0–14.0)
NEUT#: 3 10*3/uL (ref 1.5–6.5)
NEUT%: 56.6 % (ref 38.4–76.8)
Platelets: 221 10*3/uL (ref 145–400)
RBC: 3.19 10*6/uL — AB (ref 3.70–5.45)
RDW: 17.1 % — ABNORMAL HIGH (ref 11.2–14.5)
WBC: 5.3 10*3/uL (ref 3.9–10.3)
lymph#: 1.6 10*3/uL (ref 0.9–3.3)

## 2014-02-09 LAB — COMPREHENSIVE METABOLIC PANEL (CC13)
ALBUMIN: 3.6 g/dL (ref 3.5–5.0)
ALT: 9 U/L (ref 0–55)
ANION GAP: 10 meq/L (ref 3–11)
AST: 22 U/L (ref 5–34)
Alkaline Phosphatase: 123 U/L (ref 40–150)
BUN: 36.3 mg/dL — ABNORMAL HIGH (ref 7.0–26.0)
CO2: 24 mEq/L (ref 22–29)
Calcium: 9.2 mg/dL (ref 8.4–10.4)
Chloride: 103 mEq/L (ref 98–109)
Creatinine: 1.5 mg/dL — ABNORMAL HIGH (ref 0.6–1.1)
EGFR: 35 mL/min/{1.73_m2} — AB (ref >90)
GLUCOSE: 131 mg/dL (ref 70–140)
Potassium: 4.7 mEq/L (ref 3.5–5.1)
Sodium: 137 mEq/L (ref 136–145)
Total Bilirubin: 0.56 mg/dL (ref 0.20–1.20)
Total Protein: 7 g/dL (ref 6.4–8.3)

## 2014-02-09 MED ORDER — DARBEPOETIN ALFA 200 MCG/0.4ML IJ SOSY
200.0000 ug | PREFILLED_SYRINGE | Freq: Once | INTRAMUSCULAR | Status: AC
  Administered 2014-02-09: 200 ug via SUBCUTANEOUS
  Filled 2014-02-09: qty 0.4

## 2014-02-09 MED ORDER — FULVESTRANT 250 MG/5ML IM SOLN
500.0000 mg | Freq: Once | INTRAMUSCULAR | Status: AC
  Administered 2014-02-09: 500 mg via INTRAMUSCULAR
  Filled 2014-02-09: qty 10

## 2014-02-09 MED ORDER — CYANOCOBALAMIN 1000 MCG/ML IJ SOLN
1000.0000 ug | Freq: Once | INTRAMUSCULAR | Status: AC
  Administered 2014-02-09: 1000 ug via INTRAMUSCULAR

## 2014-02-09 NOTE — Patient Instructions (Signed)
Darbepoetin Alfa injection What is this medicine? DARBEPOETIN ALFA (dar be POE e tin AL fa) helps your body make more red blood cells. It is used to treat anemia caused by chronic kidney failure and chemotherapy. This medicine may be used for other purposes; ask your health care provider or pharmacist if you have questions. COMMON BRAND NAME(S): Aranesp What should I tell my health care provider before I take this medicine? They need to know if you have any of these conditions: -blood clotting disorders or history of blood clots -cancer patient not on chemotherapy -cystic fibrosis -heart disease, such as angina, heart failure, or a history of a heart attack -hemoglobin level of 12 g/dL or greater -high blood pressure -low levels of folate, iron, or vitamin B12 -seizures -an unusual or allergic reaction to darbepoetin, erythropoietin, albumin, hamster proteins, latex, other medicines, foods, dyes, or preservatives -pregnant or trying to get pregnant -breast-feeding How should I use this medicine? This medicine is for injection into a vein or under the skin. It is usually given by a health care professional in a hospital or clinic setting. If you get this medicine at home, you will be taught how to prepare and give this medicine. Do not shake the solution before you withdraw a dose. Use exactly as directed. Take your medicine at regular intervals. Do not take your medicine more often than directed. It is important that you put your used needles and syringes in a special sharps container. Do not put them in a trash can. If you do not have a sharps container, call your pharmacist or healthcare provider to get one. Talk to your pediatrician regarding the use of this medicine in children. While this medicine may be used in children as young as 1 year for selected conditions, precautions do apply. Overdosage: If you think you have taken too much of this medicine contact a poison control center or  emergency room at once. NOTE: This medicine is only for you. Do not share this medicine with others. What if I miss a dose? If you miss a dose, take it as soon as you can. If it is almost time for your next dose, take only that dose. Do not take double or extra doses. What may interact with this medicine? Do not take this medicine with any of the following medications: -epoetin alfa This list may not describe all possible interactions. Give your health care provider a list of all the medicines, herbs, non-prescription drugs, or dietary supplements you use. Also tell them if you smoke, drink alcohol, or use illegal drugs. Some items may interact with your medicine. What should I watch for while using this medicine? Visit your prescriber or health care professional for regular checks on your progress and for the needed blood tests and blood pressure measurements. It is especially important for the doctor to make sure your hemoglobin level is in the desired range, to limit the risk of potential side effects and to give you the best benefit. Keep all appointments for any recommended tests. Check your blood pressure as directed. Ask your doctor what your blood pressure should be and when you should contact him or her. As your body makes more red blood cells, you may need to take iron, folic acid, or vitamin B supplements. Ask your doctor or health care provider which products are right for you. If you have kidney disease continue dietary restrictions, even though this medication can make you feel better. Talk with your doctor or health   care professional about the foods you eat and the vitamins that you take. What side effects may I notice from receiving this medicine? Side effects that you should report to your doctor or health care professional as soon as possible: -allergic reactions like skin rash, itching or hives, swelling of the face, lips, or tongue -breathing problems -changes in vision -chest  pain -confusion, trouble speaking or understanding -feeling faint or lightheaded, falls -high blood pressure -muscle aches or pains -pain, swelling, warmth in the leg -rapid weight gain -severe headaches -sudden numbness or weakness of the face, arm or leg -trouble walking, dizziness, loss of balance or coordination -seizures (convulsions) -swelling of the ankles, feet, hands -unusually weak or tired Side effects that usually do not require medical attention (report to your doctor or health care professional if they continue or are bothersome): -diarrhea -fever, chills (flu-like symptoms) -headaches -nausea, vomiting -redness, stinging, or swelling at site where injected This list may not describe all possible side effects. Call your doctor for medical advice about side effects. You may report side effects to FDA at 1-800-FDA-1088. Where should I keep my medicine? Keep out of the reach of children. Store in a refrigerator between 2 and 8 degrees C (36 and 46 degrees F). Do not freeze. Do not shake. Throw away any unused portion if using a single-dose vial. Throw away any unused medicine after the expiration date. NOTE: This sheet is a summary. It may not cover all possible information. If you have questions about this medicine, talk to your doctor, pharmacist, or health care provider.  2015, Elsevier/Gold Standard. (2007-12-31 10:23:57) Cyanocobalamin, Vitamin B12 injection What is this medicine? CYANOCOBALAMIN (sye an oh koe BAL a min) is a man made form of vitamin B12. Vitamin B12 is used in the growth of healthy blood cells, nerve cells, and proteins in the body. It also helps with the metabolism of fats and carbohydrates. This medicine is used to treat people who can not absorb vitamin B12. This medicine may be used for other purposes; ask your health care provider or pharmacist if you have questions. COMMON BRAND NAME(S): Cyomin, LA-12, Nutri-Twelve, Primabalt What should I tell  my health care provider before I take this medicine? They need to know if you have any of these conditions: -kidney disease -Leber's disease -megaloblastic anemia -an unusual or allergic reaction to cyanocobalamin, cobalt, other medicines, foods, dyes, or preservatives -pregnant or trying to get pregnant -breast-feeding How should I use this medicine? This medicine is injected into a muscle or deeply under the skin. It is usually given by a health care professional in a clinic or doctor's office. However, your doctor may teach you how to inject yourself. Follow all instructions. Talk to your pediatrician regarding the use of this medicine in children. Special care may be needed. Overdosage: If you think you have taken too much of this medicine contact a poison control center or emergency room at once. NOTE: This medicine is only for you. Do not share this medicine with others. What if I miss a dose? If you are given your dose at a clinic or doctor's office, call to reschedule your appointment. If you give your own injections and you miss a dose, take it as soon as you can. If it is almost time for your next dose, take only that dose. Do not take double or extra doses. What may interact with this medicine? -colchicine -heavy alcohol intake This list may not describe all possible interactions. Give your health care  provider a list of all the medicines, herbs, non-prescription drugs, or dietary supplements you use. Also tell them if you smoke, drink alcohol, or use illegal drugs. Some items may interact with your medicine. What should I watch for while using this medicine? Visit your doctor or health care professional regularly. You may need blood work done while you are taking this medicine. You may need to follow a special diet. Talk to your doctor. Limit your alcohol intake and avoid smoking to get the best benefit. What side effects may I notice from receiving this medicine? Side effects that  you should report to your doctor or health care professional as soon as possible: -allergic reactions like skin rash, itching or hives, swelling of the face, lips, or tongue -blue tint to skin -chest tightness, pain -difficulty breathing, wheezing -dizziness -red, swollen painful area on the leg Side effects that usually do not require medical attention (report to your doctor or health care professional if they continue or are bothersome): -diarrhea -headache This list may not describe all possible side effects. Call your doctor for medical advice about side effects. You may report side effects to FDA at 1-800-FDA-1088. Where should I keep my medicine? Keep out of the reach of children. Store at room temperature between 15 and 30 degrees C (59 and 85 degrees F). Protect from light. Throw away any unused medicine after the expiration date. NOTE: This sheet is a summary. It may not cover all possible information. If you have questions about this medicine, talk to your doctor, pharmacist, or health care provider.  2015, Elsevier/Gold Standard. (2007-04-29 22:10:20) Fulvestrant injection What is this medicine? FULVESTRANT (ful VES trant) blocks the effects of estrogen. It is used to treat breast cancer in women past the age of menopause. This medicine may be used for other purposes; ask your health care provider or pharmacist if you have questions. COMMON BRAND NAME(S): FASLODEX What should I tell my health care provider before I take this medicine? They need to know if you have any of these conditions: -bleeding problems -liver disease -low levels of platelets in the blood -an unusual or allergic reaction to fulvestrant, other medicines, foods, dyes, or preservatives -pregnant or trying to get pregnant -breast-feeding How should I use this medicine? This medicine is for injection into a muscle. It is usually given by a health care professional in a hospital or clinic setting. Talk to  your pediatrician regarding the use of this medicine in children. Special care may be needed. Overdosage: If you think you have taken too much of this medicine contact a poison control center or emergency room at once. NOTE: This medicine is only for you. Do not share this medicine with others. What if I miss a dose? It is important not to miss your dose. Call your doctor or health care professional if you are unable to keep an appointment. What may interact with this medicine? -medicines that treat or prevent blood clots like warfarin, enoxaparin, and dalteparin This list may not describe all possible interactions. Give your health care provider a list of all the medicines, herbs, non-prescription drugs, or dietary supplements you use. Also tell them if you smoke, drink alcohol, or use illegal drugs. Some items may interact with your medicine. What should I watch for while using this medicine? Your condition will be monitored carefully while you are receiving this medicine. You will need important blood work done while you are taking this medicine. Do not become pregnant while taking this medicine.  Women should inform their doctor if they wish to become pregnant or think they might be pregnant. There is a potential for serious side effects to an unborn child. Talk to your health care professional or pharmacist for more information. What side effects may I notice from receiving this medicine? Side effects that you should report to your doctor or health care professional as soon as possible: -allergic reactions like skin rash, itching or hives, swelling of the face, lips, or tongue -feeling faint or lightheaded, falls -fever or flu-like symptoms -sore throat -vaginal bleeding Side effects that usually do not require medical attention (report to your doctor or health care professional if they continue or are bothersome): -aches, pains -constipation or diarrhea -headache -hot flashes -nausea,  vomiting -pain at site where injected -stomach pain This list may not describe all possible side effects. Call your doctor for medical advice about side effects. You may report side effects to FDA at 1-800-FDA-1088. Where should I keep my medicine? This drug is given in a hospital or clinic and will not be stored at home. NOTE: This sheet is a summary. It may not cover all possible information. If you have questions about this medicine, talk to your doctor, pharmacist, or health care provider.  2015, Elsevier/Gold Standard. (2007-05-27 15:39:24)

## 2014-02-16 ENCOUNTER — Ambulatory Visit: Payer: Self-pay | Admitting: Family Medicine

## 2014-02-17 ENCOUNTER — Other Ambulatory Visit: Payer: Self-pay | Admitting: *Deleted

## 2014-02-17 DIAGNOSIS — C7951 Secondary malignant neoplasm of bone: Principal | ICD-10-CM

## 2014-02-17 DIAGNOSIS — C50911 Malignant neoplasm of unspecified site of right female breast: Secondary | ICD-10-CM

## 2014-02-17 MED ORDER — MORPHINE SULFATE ER 30 MG PO TBCR: 30.0000 mg | EXTENDED_RELEASE_TABLET | Freq: Two times a day (BID) | ORAL | Status: DC

## 2014-02-18 ENCOUNTER — Other Ambulatory Visit (HOSPITAL_COMMUNITY): Payer: Self-pay

## 2014-02-18 ENCOUNTER — Ambulatory Visit: Payer: Self-pay | Admitting: Family

## 2014-02-19 ENCOUNTER — Other Ambulatory Visit: Payer: Self-pay

## 2014-02-19 MED ORDER — CARVEDILOL 25 MG PO TABS: 25.0000 mg | ORAL_TABLET | Freq: Two times a day (BID) | ORAL | Status: DC

## 2014-03-02 ENCOUNTER — Encounter: Payer: Self-pay | Admitting: Family Medicine

## 2014-03-02 ENCOUNTER — Ambulatory Visit (INDEPENDENT_AMBULATORY_CARE_PROVIDER_SITE_OTHER): Payer: Medicare Other | Admitting: Family Medicine

## 2014-03-02 VITALS — BP 130/65 | HR 63 | Temp 97.7°F | Ht 63.0 in | Wt 188.0 lb

## 2014-03-02 DIAGNOSIS — I10 Essential (primary) hypertension: Secondary | ICD-10-CM

## 2014-03-02 DIAGNOSIS — R1013 Epigastric pain: Secondary | ICD-10-CM

## 2014-03-02 DIAGNOSIS — M810 Age-related osteoporosis without current pathological fracture: Secondary | ICD-10-CM

## 2014-03-02 DIAGNOSIS — E119 Type 2 diabetes mellitus without complications: Secondary | ICD-10-CM

## 2014-03-02 DIAGNOSIS — E1169 Type 2 diabetes mellitus with other specified complication: Secondary | ICD-10-CM

## 2014-03-02 DIAGNOSIS — E669 Obesity, unspecified: Secondary | ICD-10-CM

## 2014-03-02 DIAGNOSIS — K219 Gastro-esophageal reflux disease without esophagitis: Secondary | ICD-10-CM

## 2014-03-02 DIAGNOSIS — E782 Mixed hyperlipidemia: Secondary | ICD-10-CM

## 2014-03-02 DIAGNOSIS — Z780 Asymptomatic menopausal state: Secondary | ICD-10-CM

## 2014-03-02 DIAGNOSIS — E039 Hypothyroidism, unspecified: Secondary | ICD-10-CM

## 2014-03-02 DIAGNOSIS — E785 Hyperlipidemia, unspecified: Secondary | ICD-10-CM

## 2014-03-02 LAB — COMPREHENSIVE METABOLIC PANEL
ALBUMIN: 3.6 g/dL (ref 3.5–5.2)
ALT: 9 U/L (ref 0–35)
AST: 19 U/L (ref 0–37)
Alkaline Phosphatase: 120 U/L — ABNORMAL HIGH (ref 39–117)
BILIRUBIN TOTAL: 0.4 mg/dL (ref 0.2–1.2)
BUN: 27 mg/dL — AB (ref 6–23)
CHLORIDE: 101 meq/L (ref 96–112)
CO2: 24 mEq/L (ref 19–32)
Calcium: 9.1 mg/dL (ref 8.4–10.5)
Creatinine, Ser: 1.25 mg/dL — ABNORMAL HIGH (ref 0.40–1.20)
GFR: 44.84 mL/min — ABNORMAL LOW (ref >60.00)
Glucose, Bld: 105 mg/dL — ABNORMAL HIGH (ref 70–99)
POTASSIUM: 4.9 meq/L (ref 3.5–5.1)
SODIUM: 134 meq/L — AB (ref 135–145)
Total Protein: 7 g/dL (ref 6.0–8.3)

## 2014-03-02 LAB — LIPID PANEL
Cholesterol: 134 mg/dL (ref 0–200)
HDL: 27.7 mg/dL — ABNORMAL LOW (ref >39.00)
LDL CALC: 76 mg/dL (ref 0–99)
NonHDL: 106.3
Total CHOL/HDL Ratio: 5
Triglycerides: 151 mg/dL — ABNORMAL HIGH (ref 0.0–149.0)
VLDL: 30.2 mg/dL (ref 0.0–40.0)

## 2014-03-02 LAB — H. PYLORI ANTIBODY, IGG: H Pylori IgG: NEGATIVE

## 2014-03-02 LAB — HEMOGLOBIN A1C: Hgb A1c MFr Bld: 7.5 % — ABNORMAL HIGH (ref 4.6–6.5)

## 2014-03-02 LAB — TSH: TSH: 41.07 u[IU]/mL — AB (ref 0.35–4.50)

## 2014-03-02 MED ORDER — OMEPRAZOLE 40 MG PO CPDR: 40.0000 mg | DELAYED_RELEASE_CAPSULE | Freq: Every day | ORAL | Status: DC

## 2014-03-02 NOTE — Progress Notes (Signed)
Brittney Cunningham  [id number] May 07, 1940 03/02/2014      Progress Note-Follow Up  Subjective  Chief Complaint  Chief Complaint  Patient presents with  . Follow-up  . Nasal Congestion    started on Friday    HPI  Patient is a 74 y.o. female in today for routine medical care. Patient is in today for follow-up. Has been struggling with a mild head cold but no fevers or chills. Has some myalgias and clear rhinorrhea. Blood sugars have ranged from 70-200 with an average being about 120. No polyuria or polydipsia. No other recent illness. Denies CP/palp/SOB/HA/congestion/fevers/GI or GU c/o. Taking meds as prescribed  Past Medical History  Diagnosis Date  . Anemia   . Depression   . Diabetes mellitus type II   . GERD (gastroesophageal reflux disease)   . Hyperlipidemia   . Hypothyroidism   . Osteoarthritis   . Carotid artery stenosis     bilateral  . CAD (coronary artery disease)     medical therapy, old tot RCA, patent LAD, mod. severe ostial LCX stenosis  . OSA (obstructive sleep apnea)   . Breast cancer     right breast cancer-invasive  ductal carcinoma (StageIV)  . Hypertension   . Anxiety   . Carotid artery occlusion   . Unspecified constipation 03/15/2012  . Bursitis   . Collagen vascular disease   . History of radiation therapy 10/31/12-11/13/12    rt&lt humerous 30Gy/72f  . Valvular heart disease 07/11/2013  . Diarrhea 09/28/2013    Past Surgical History  Procedure Laterality Date  . Incisional breast biopsy      remoted left breast biopsy  . Cesarean section    . Other surgical history      GYN surgery  . Knee surgery      right knee surgery  . Carotid endarterectomy      right carotid  . Port-a-cath removal    . Modified mastectomy      Right breast  . Breast surgery    . Mastectomy    . Carotid stent      left  . Cardiac catheterization  06/2013    old total occ. of RCA, moderately severe ostial LCX stenosis- medical therapy- would be complex PCI  .  Carotid angiogram N/A 03/01/2011    Procedure: CAROTID ANGIOGRAM;  Surgeon: BConrad [place] MD;  [facility];  Service: Cardiovascular;  Laterality: N/A;  . Arch aortogram  03/01/2011    Procedure: ARCH AORTOGRAM;  Surgeon: BConrad [place] MD;  [facility];  Service: Cardiovascular;;  . Carotid stent insertion Left 03/15/2011    Procedure: CAROTID STENT INSERTION;  Surgeon: VSerafina Mitchell MD;  [facility];  Service: Cardiovascular;  Laterality: Left;  . Left heart catheterization with coronary angiogram N/A 07/07/2013    Procedure: LEFT HEART CATHETERIZATION WITH CORONARY ANGIOGRAM;  Surgeon: MBlane Ohara MD;  [facility];  Service: Cardiovascular;  Laterality: N/A;    Family History  Problem Relation Age of Onset  . Arthritis    . Coronary artery disease      first degree relative  . Stroke      first degree relative  . Diabetes Mother   . Heart disease Mother   . Hypertension Mother   . Cancer Mother   . Deep vein thrombosis Mother   . Colon cancer Neg Hx   . Stomach cancer Neg Hx   . Cancer Father  Pancreatic cancer  . Breast cancer Paternal Aunt 40    History   Social History  . Marital Status: Married    Spouse Name: N/A    Number of Children: 3  . Years of Education: N/A   Occupational History  . 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 Smoking status: Former Smoker -- 1.00 packs/day for 20 years    Types: Cigarettes    Quit date: 01/31/1988  . Smokeless tobacco: Never Used  . Alcohol Use: No  . Drug Use: No  . Sexual Activity: Not Currently    Birth Control/ Protection: Post-menopausal   Other Topics Concern  . Not on file   Social History Narrative   Retired-ran a daycare facility for 40 years.   Married- 35 years    2 sons    1 daughter - Marine scientist midwife   Former smoker-quit in [place].     Current Outpatient Prescriptions on File Prior to Visit  Medication Sig Dispense Refill  .  aspirin 81 MG chewable tablet Chew 81 mg by mouth daily.    Marland Kitchen atorvastatin (LIPITOR) 80 MG tablet Take 1 tablet (80 mg total) by mouth daily. 30 tablet 6  . Blood Glucose Monitoring Suppl (ACCU-CHEK NANO SMARTVIEW) W/DEVICE KIT Use to check blood sugar twice a day. DX 250.00 1 kit 0  . carvedilol (COREG) 25 MG tablet Take 1 tablet (25 mg total) by mouth 2 (two) times daily with a meal. 60 tablet 6  . clopidogrel (PLAVIX) 75 MG tablet Take 1 tablet (75 mg total) by mouth daily. 30 tablet 8  . cyanocobalamin (,VITAMIN B-12,) 1000 MCG/ML injection Inject 1,000 mcg into the muscle every 30 (thirty) days. Last dose 03/09/2011    . darbepoetin (ARANESP) 200 MCG/0.4ML SOLN Inject 200 mcg into the skin once. Every 30 days    . glucose blood (ACCU-CHEK AVIVA) test strip Use as instructed to check blood sugar twice a day  Dx 250.00 100 each 3  . glycerin adult (GLYCERIN ADULT) 2 G SUPP Place 1 suppository rectally once as needed (constipation). 10 suppository 0  . hydrochlorothiazide (MICROZIDE) 12.5 MG capsule Take 1 capsule (12.5 mg total) by mouth daily. 30 capsule 3  . insulin glargine (LANTUS) 100 UNIT/ML injection Inject 0.6 mLs (60 Units total) into the skin at bedtime. Pt wants vials. 30 mL 1  . isosorbide mononitrate (IMDUR) 60 MG 24 hr tablet Take 2 tablets (120 mg total) by mouth daily. 60 tablet 11  . levothyroxine (SYNTHROID, LEVOTHROID) 75 MCG tablet Take 1 tablet (75 mcg total) by mouth daily before breakfast. 30 tablet 1  . metFORMIN (GLUCOPHAGE) 500 MG tablet Take 1 tablet (500 mg total) by mouth every morning. 30 tablet 5  . morphine (MS CONTIN) 30 MG 12 hr tablet Take 1 tablet (30 mg total) by mouth 2 (two) times daily. 60 tablet 0  . nitroGLYCERIN (NITROSTAT) 0.4 MG SL tablet Place 1 tablet (0.4 mg total) under the tongue every 5 (five) minutes as needed for chest pain. 30 tablet 0  . polyethylene glycol (MIRALAX / GLYCOLAX) packet Take 17 g by mouth daily as needed.    . [DISCONTINUED]  simvastatin (ZOCOR) 40 MG tablet Take 40 mg by mouth every evening.     No current facility-administered medications on file prior to visit.    Allergies  Allergen Reactions  . Chlorhexidine Hives, Itching and Rash    This was most likely a CONTACT  DERMATITIS versus true systemic allergic reaction  . Adhesive [Tape] Hives  . Codeine Swelling    Review of Systems  Review of Systems  Constitutional: Positive for malaise/fatigue. Negative for fever.  HENT: Positive for congestion.   Eyes: Negative for discharge.  Respiratory: Negative for cough, sputum production and shortness of breath.   Cardiovascular: Negative for chest pain, palpitations and leg swelling.  Gastrointestinal: Positive for heartburn. Negative for nausea, abdominal pain and diarrhea.  Genitourinary: Negative for dysuria.  Musculoskeletal: Positive for myalgias. Negative for falls.  Skin: Negative for rash.  Neurological: Positive for headaches. Negative for loss of consciousness.  Endo/Heme/Allergies: Negative for polydipsia.  Psychiatric/Behavioral: Negative for depression and suicidal ideas. The patient is not nervous/anxious and does not have insomnia.     Objective  BP 143/63 mmHg  Pulse 63  Temp(Src) 97.7 F (36.5 C) (Oral)  Ht _0  (1.6 m)  Wt 188 lb (85.276 kg)  BMI 33.31 kg/m2  SpO2 98%  LMP 03/13/2012  Physical Exam  Physical Exam  Constitutional: She is oriented to person, place, and time and well-developed, well-nourished, and in no distress. No distress.  HENT:  Head: Normocephalic and atraumatic.  Eyes: Conjunctivae are normal.  Neck: Neck supple. No thyromegaly present.  Cardiovascular: Normal rate, regular rhythm and normal heart sounds.   No murmur heard. Pulmonary/Chest: Effort normal and breath sounds normal. She has no wheezes.  Abdominal: She exhibits no distension and no mass.  Musculoskeletal: She exhibits no edema.  Lymphadenopathy:    She has no cervical adenopathy.    Neurological: She is alert and oriented to person, place, and time.  Skin: Skin is warm and dry. No rash noted. She is not diaphoretic.  Psychiatric: Memory, affect and judgment normal.    Lab Results  Component Value Date   TSH 0.10* 09/30/2013   Lab Results  Component Value Date   WBC 5.3 02/09/2014   HGB 9.4* 02/09/2014   HCT 29.8* 02/09/2014   MCV 93.5 02/09/2014   PLT 221 02/09/2014   Lab Results  Component Value Date   CREATININE 1.5* 02/09/2014   BUN 36.3* 02/09/2014   NA 137 02/09/2014   K 4.7 02/09/2014   CL 94* 10/19/2013   CO2 24 02/09/2014   Lab Results  Component Value Date   ALT 9 02/09/2014   AST 22 02/09/2014   ALKPHOS 123 02/09/2014   BILITOT 0.56 02/09/2014   Lab Results  Component Value Date   CHOL 130 07/03/2013   Lab Results  Component Value Date   HDL 22* 07/03/2013   Lab Results  Component Value Date   LDLCALC 80 07/03/2013   Lab Results  Component Value Date   TRIG 142 07/03/2013   Lab Results  Component Value Date   CHOLHDL 5.9 07/03/2013     Assessment & Plan  Essential hypertension Well controlled, no changes to meds. Encouraged heart healthy diet such as the DASH diet and exercise as tolerated.    GERD Avoid offending foods, start probiotics. Do not eat large meals in late evening and consider raising head of bed. Continue medications as directed   Hypothyroidism On Levothyroxine, continue to monitor   Diabetes mellitus type 2 in obese [id number] mildly elevated at 7.5, minimize simple carbs. Increase exercise as tolerated. Continue current meds   Postmenopausal estrogen deficiency Check Dexa scan   Hyperlipidemia, mixed Tolerating statin, encouraged heart healthy diet, avoid trans fats, minimize simple carbs and saturated fats. Increase exercise as tolerated

## 2014-03-02 NOTE — Patient Instructions (Signed)
Encouraged increased hydration and fiber in diet. Daily probiotics. If bowels not moving can use MOM 2 tbls po in 4 oz of warm prune juice by mouth every 2-3 days. If no results then repeat in 4 hours with  Dulcolax suppository pr, may repeat again in 4 more hours as needed. Seek care if symptoms worsen. Consider daily Miralax and/or Dulcolax if symptoms persist.   Start metamucil or benefiber daily  Will your insurance cover new insulin? If so does it need a prior authorization, what is the copay for you   Constipation Constipation is when a person has fewer than three bowel movements a week, has difficulty having a bowel movement, or has stools that are dry, hard, or larger than normal. As people grow older, constipation is more common. If you try to fix constipation with medicines that make you have a bowel movement (laxatives), the problem may get worse. Long-term laxative use may cause the muscles of the colon to become weak. A low-fiber diet, not taking in enough fluids, and taking certain medicines may make constipation worse.  CAUSES   Certain medicines, such as antidepressants, pain medicine, iron supplements, antacids, and water pills.   Certain diseases, such as diabetes, irritable bowel syndrome (IBS), thyroid disease, or depression.   Not drinking enough water.   Not eating enough fiber-rich foods.   Stress or travel.   Lack of physical activity or exercise.   Ignoring the urge to have a bowel movement.   Using laxatives too much.  SIGNS AND SYMPTOMS   Having fewer than three bowel movements a week.   Straining to have a bowel movement.   Having stools that are hard, dry, or larger than normal.   Feeling full or bloated.   Pain in the lower abdomen.   Not feeling relief after having a bowel movement.  DIAGNOSIS  Your health care provider will take a medical history and perform a physical exam. Further testing may be done for severe constipation. Some  tests may include:  A barium enema X-ray to examine your rectum, colon, and, sometimes, your small intestine.   A sigmoidoscopy to examine your lower colon.   A colonoscopy to examine your entire colon. TREATMENT  Treatment will depend on the severity of your constipation and what is causing it. Some dietary treatments include drinking more fluids and eating more fiber-rich foods. Lifestyle treatments may include regular exercise. If these diet and lifestyle recommendations do not help, your health care provider may recommend taking over-the-counter laxative medicines to help you have bowel movements. Prescription medicines may be prescribed if over-the-counter medicines do not work.  HOME CARE INSTRUCTIONS   Eat foods that have a lot of fiber, such as fruits, vegetables, whole grains, and beans.  Limit foods high in fat and processed sugars, such as french fries, hamburgers, cookies, candies, and soda.   A fiber supplement may be added to your diet if you cannot get enough fiber from foods.   Drink enough fluids to keep your urine clear or pale yellow.   Exercise regularly or as directed by your health care provider.   Go to the restroom when you have the urge to go. Do not hold it.   Only take over-the-counter or prescription medicines as directed by your health care provider. Do not take other medicines for constipation without talking to your health care provider first.  [place] IF:   You have bright red blood in your stool.   Your constipation  lasts for more than 4 days or gets worse.   You have abdominal or rectal pain.   You have thin, pencil-like stools.   You have unexplained weight loss. MAKE SURE YOU:   Understand these instructions.  Will watch your condition.  Will get help right away if you are not doing well or get worse. Document Released: 10/15/2003 Document Revised: 01/21/2013 Document Reviewed: 10/28/2012 [facility] Patient  Information 2015 [place], [place]. This information is not intended to replace advice given to you by your health care provider. Make sure you discuss any questions you have with your health care provider.

## 2014-03-02 NOTE — Progress Notes (Signed)
Pre visit review using our clinic review tool, if applicable. No additional management support is needed unless otherwise documented below in the visit note. 

## 2014-03-06 ENCOUNTER — Other Ambulatory Visit: Payer: Self-pay | Admitting: *Deleted

## 2014-03-06 DIAGNOSIS — C50911 Malignant neoplasm of unspecified site of right female breast: Secondary | ICD-10-CM

## 2014-03-08 DIAGNOSIS — Z780 Asymptomatic menopausal state: Secondary | ICD-10-CM | POA: Insufficient documentation

## 2014-03-08 NOTE — Assessment & Plan Note (Signed)
hgba1c mildly elevated at 7.5, minimize simple carbs. Increase exercise as tolerated. Continue current meds

## 2014-03-08 NOTE — Assessment & Plan Note (Signed)
Check Dexa scan

## 2014-03-08 NOTE — Assessment & Plan Note (Signed)
Well controlled, no changes to meds. Encouraged heart healthy diet such as the DASH diet and exercise as tolerated.  °

## 2014-03-08 NOTE — Assessment & Plan Note (Signed)
On Levothyroxine, continue to monitor 

## 2014-03-08 NOTE — Assessment & Plan Note (Signed)
Tolerating statin, encouraged heart healthy diet, avoid trans fats, minimize simple carbs and saturated fats. Increase exercise as tolerated 

## 2014-03-08 NOTE — Assessment & Plan Note (Signed)
Avoid offending foods, start probiotics. Do not eat large meals in late evening and consider raising head of bed. Continue medications as directed

## 2014-03-09 ENCOUNTER — Other Ambulatory Visit: Payer: Self-pay | Admitting: *Deleted

## 2014-03-09 ENCOUNTER — Telehealth: Payer: Self-pay | Admitting: Nurse Practitioner

## 2014-03-09 ENCOUNTER — Other Ambulatory Visit: Payer: Self-pay

## 2014-03-09 ENCOUNTER — Ambulatory Visit: Payer: Self-pay | Admitting: Nurse Practitioner

## 2014-03-09 NOTE — Telephone Encounter (Signed)
cld & spoke to pt and adv of appt on 2/11-gave pt time & date-pt understood

## 2014-03-12 ENCOUNTER — Other Ambulatory Visit (HOSPITAL_BASED_OUTPATIENT_CLINIC_OR_DEPARTMENT_OTHER): Payer: Medicare Other

## 2014-03-12 ENCOUNTER — Ambulatory Visit (HOSPITAL_BASED_OUTPATIENT_CLINIC_OR_DEPARTMENT_OTHER): Payer: Medicare Other

## 2014-03-12 DIAGNOSIS — E538 Deficiency of other specified B group vitamins: Secondary | ICD-10-CM

## 2014-03-12 DIAGNOSIS — D649 Anemia, unspecified: Secondary | ICD-10-CM

## 2014-03-12 DIAGNOSIS — C50911 Malignant neoplasm of unspecified site of right female breast: Secondary | ICD-10-CM

## 2014-03-12 DIAGNOSIS — N189 Chronic kidney disease, unspecified: Secondary | ICD-10-CM

## 2014-03-12 DIAGNOSIS — C7951 Secondary malignant neoplasm of bone: Secondary | ICD-10-CM

## 2014-03-12 DIAGNOSIS — C50919 Malignant neoplasm of unspecified site of unspecified female breast: Secondary | ICD-10-CM

## 2014-03-12 DIAGNOSIS — C50611 Malignant neoplasm of axillary tail of right female breast: Secondary | ICD-10-CM

## 2014-03-12 DIAGNOSIS — Z5111 Encounter for antineoplastic chemotherapy: Secondary | ICD-10-CM

## 2014-03-12 DIAGNOSIS — D631 Anemia in chronic kidney disease: Secondary | ICD-10-CM

## 2014-03-12 LAB — COMPREHENSIVE METABOLIC PANEL (CC13)
ALT: 7 U/L (ref 0–55)
ANION GAP: 13 meq/L — AB (ref 3–11)
AST: 22 U/L (ref 5–34)
Albumin: 3.2 g/dL — ABNORMAL LOW (ref 3.5–5.0)
Alkaline Phosphatase: 130 U/L (ref 40–150)
BILIRUBIN TOTAL: 0.35 mg/dL (ref 0.20–1.20)
BUN: 23.8 mg/dL (ref 7.0–26.0)
CALCIUM: 9.4 mg/dL (ref 8.4–10.4)
CHLORIDE: 102 meq/L (ref 98–109)
CO2: 25 meq/L (ref 22–29)
Creatinine: 1.1 mg/dL (ref 0.6–1.1)
EGFR: 48 mL/min/{1.73_m2} — ABNORMAL LOW (ref >90)
GLUCOSE: 181 mg/dL — AB (ref 70–140)
Potassium: 4.3 mEq/L (ref 3.5–5.1)
Sodium: 139 mEq/L (ref 136–145)
Total Protein: 7.2 g/dL (ref 6.4–8.3)

## 2014-03-12 LAB — CBC WITH DIFFERENTIAL/PLATELET
BASO%: 0.7 % (ref 0.0–2.0)
Basophils Absolute: 0 10*3/uL (ref 0.0–0.1)
EOS%: 3.1 % (ref 0.0–7.0)
Eosinophils Absolute: 0.2 10*3/uL (ref 0.0–0.5)
HCT: 29.1 % — ABNORMAL LOW (ref 34.8–46.6)
HGB: 9.1 g/dL — ABNORMAL LOW (ref 11.6–15.9)
LYMPH%: 27.4 % (ref 14.0–49.7)
MCH: 29.1 pg (ref 25.1–34.0)
MCHC: 31.3 g/dL — AB (ref 31.5–36.0)
MCV: 93.2 fL (ref 79.5–101.0)
MONO#: 0.5 10*3/uL (ref 0.1–0.9)
MONO%: 8.3 % (ref 0.0–14.0)
NEUT#: 3.7 10*3/uL (ref 1.5–6.5)
NEUT%: 60.5 % (ref 38.4–76.8)
Platelets: 274 10*3/uL (ref 145–400)
RBC: 3.13 10*6/uL — AB (ref 3.70–5.45)
RDW: 15.5 % — AB (ref 11.2–14.5)
WBC: 6.2 10*3/uL (ref 3.9–10.3)
lymph#: 1.7 10*3/uL (ref 0.9–3.3)

## 2014-03-12 MED ORDER — CYANOCOBALAMIN 1000 MCG/ML IJ SOLN
1000.0000 ug | Freq: Once | INTRAMUSCULAR | Status: AC
  Administered 2014-03-12: 1000 ug via INTRAMUSCULAR

## 2014-03-12 MED ORDER — FULVESTRANT 250 MG/5ML IM SOLN
500.0000 mg | Freq: Once | INTRAMUSCULAR | Status: AC
  Administered 2014-03-12: 500 mg via INTRAMUSCULAR
  Filled 2014-03-12: qty 10

## 2014-03-12 MED ORDER — DARBEPOETIN ALFA 200 MCG/0.4ML IJ SOSY
200.0000 ug | PREFILLED_SYRINGE | Freq: Once | INTRAMUSCULAR | Status: AC
  Administered 2014-03-12: 200 ug via SUBCUTANEOUS
  Filled 2014-03-12: qty 0.4

## 2014-03-13 ENCOUNTER — Encounter: Payer: Self-pay | Admitting: Family

## 2014-03-16 ENCOUNTER — Inpatient Hospital Stay (HOSPITAL_COMMUNITY)
Admission: RE | Admit: 2014-03-16 | Discharge: 2014-03-16 | Disposition: A | Discharge status: Home / Self Care | Payer: Self-pay | Source: Ambulatory Visit | Attending: Surgery | Admitting: Surgery

## 2014-03-16 ENCOUNTER — Ambulatory Visit: Payer: Self-pay | Admitting: Family

## 2014-03-16 DIAGNOSIS — I6523 Occlusion and stenosis of bilateral carotid arteries: Secondary | ICD-10-CM

## 2014-03-19 ENCOUNTER — Telehealth: Payer: Self-pay

## 2014-03-19 ENCOUNTER — Other Ambulatory Visit: Payer: Self-pay | Admitting: *Deleted

## 2014-03-19 DIAGNOSIS — C50911 Malignant neoplasm of unspecified site of right female breast: Secondary | ICD-10-CM

## 2014-03-19 DIAGNOSIS — C7951 Secondary malignant neoplasm of bone: Principal | ICD-10-CM

## 2014-03-19 DIAGNOSIS — E039 Hypothyroidism, unspecified: Secondary | ICD-10-CM

## 2014-03-19 MED ORDER — LEVOTHYROXINE SODIUM 75 MCG PO TABS: 75.0000 ug | ORAL_TABLET | Freq: Every day | ORAL | Status: DC

## 2014-03-19 MED ORDER — MORPHINE SULFATE ER 30 MG PO TBCR: 30.0000 mg | EXTENDED_RELEASE_TABLET | Freq: Two times a day (BID) | ORAL | Status: DC

## 2014-03-19 NOTE — Telephone Encounter (Signed)
Pt called requesting refill of MS contin. Husband will pick up tomorrow. rx prepared for signature

## 2014-03-20 ENCOUNTER — Other Ambulatory Visit: Payer: Self-pay | Admitting: Oncology

## 2014-03-20 MED ORDER — MORPHINE SULFATE ER 30 MG PO TBCR: 30.0000 mg | EXTENDED_RELEASE_TABLET | Freq: Two times a day (BID) | ORAL | Status: DC

## 2014-03-23 ENCOUNTER — Other Ambulatory Visit: Payer: Self-pay

## 2014-03-23 MED ORDER — ATORVASTATIN CALCIUM 80 MG PO TABS: 80.0000 mg | ORAL_TABLET | Freq: Every day | ORAL | Status: DC

## 2014-03-27 ENCOUNTER — Encounter: Payer: Self-pay | Admitting: Family

## 2014-03-30 ENCOUNTER — Ambulatory Visit: Payer: Self-pay | Admitting: Family

## 2014-03-30 ENCOUNTER — Other Ambulatory Visit (HOSPITAL_COMMUNITY): Payer: Self-pay

## 2014-04-03 ENCOUNTER — Other Ambulatory Visit: Payer: Self-pay | Admitting: *Deleted

## 2014-04-03 DIAGNOSIS — C7951 Secondary malignant neoplasm of bone: Principal | ICD-10-CM

## 2014-04-03 DIAGNOSIS — C50919 Malignant neoplasm of unspecified site of unspecified female breast: Secondary | ICD-10-CM

## 2014-04-06 ENCOUNTER — Ambulatory Visit (HOSPITAL_BASED_OUTPATIENT_CLINIC_OR_DEPARTMENT_OTHER): Payer: Medicare Other

## 2014-04-06 ENCOUNTER — Telehealth: Payer: Self-pay | Admitting: Nurse Practitioner

## 2014-04-06 ENCOUNTER — Other Ambulatory Visit: Payer: Self-pay

## 2014-04-06 ENCOUNTER — Other Ambulatory Visit (HOSPITAL_BASED_OUTPATIENT_CLINIC_OR_DEPARTMENT_OTHER): Payer: Medicare Other

## 2014-04-06 ENCOUNTER — Ambulatory Visit (HOSPITAL_BASED_OUTPATIENT_CLINIC_OR_DEPARTMENT_OTHER): Payer: Medicare Other | Admitting: Nurse Practitioner

## 2014-04-06 VITALS — BP 156/76 | HR 68 | Temp 98.1°F | Resp 18 | Ht 63.0 in | Wt 185.6 lb

## 2014-04-06 DIAGNOSIS — D649 Anemia, unspecified: Secondary | ICD-10-CM

## 2014-04-06 DIAGNOSIS — C50611 Malignant neoplasm of axillary tail of right female breast: Secondary | ICD-10-CM

## 2014-04-06 DIAGNOSIS — D631 Anemia in chronic kidney disease: Secondary | ICD-10-CM | POA: Diagnosis not present

## 2014-04-06 DIAGNOSIS — C7952 Secondary malignant neoplasm of bone marrow: Secondary | ICD-10-CM

## 2014-04-06 DIAGNOSIS — Z95828 Presence of other vascular implants and grafts: Secondary | ICD-10-CM

## 2014-04-06 DIAGNOSIS — N189 Chronic kidney disease, unspecified: Secondary | ICD-10-CM

## 2014-04-06 DIAGNOSIS — C7951 Secondary malignant neoplasm of bone: Secondary | ICD-10-CM

## 2014-04-06 DIAGNOSIS — C50919 Malignant neoplasm of unspecified site of unspecified female breast: Secondary | ICD-10-CM

## 2014-04-06 DIAGNOSIS — C50911 Malignant neoplasm of unspecified site of right female breast: Secondary | ICD-10-CM

## 2014-04-06 DIAGNOSIS — E538 Deficiency of other specified B group vitamins: Secondary | ICD-10-CM

## 2014-04-06 DIAGNOSIS — Z170 Estrogen receptor positive status [ER+]: Secondary | ICD-10-CM

## 2014-04-06 LAB — COMPREHENSIVE METABOLIC PANEL (CC13)
ALBUMIN: 2.9 g/dL — AB (ref 3.5–5.0)
ALK PHOS: 158 U/L — AB (ref 40–150)
ALT: <6 U/L (ref 0–55)
AST: 17 U/L (ref 5–34)
Anion Gap: 13 mEq/L — ABNORMAL HIGH (ref 3–11)
BUN: 33.2 mg/dL — ABNORMAL HIGH (ref 7.0–26.0)
CO2: 23 mEq/L (ref 22–29)
Calcium: 9.3 mg/dL (ref 8.4–10.4)
Chloride: 100 mEq/L (ref 98–109)
Creatinine: 1.3 mg/dL — ABNORMAL HIGH (ref 0.6–1.1)
EGFR: 42 mL/min/{1.73_m2} — ABNORMAL LOW (ref >90)
Glucose: 216 mg/dl — ABNORMAL HIGH (ref 70–140)
POTASSIUM: 4.9 meq/L (ref 3.5–5.1)
Sodium: 136 mEq/L (ref 136–145)
TOTAL PROTEIN: 7 g/dL (ref 6.4–8.3)
Total Bilirubin: 0.24 mg/dL (ref 0.20–1.20)

## 2014-04-06 LAB — CBC WITH DIFFERENTIAL/PLATELET
BASO%: 0.4 % (ref 0.0–2.0)
BASOS ABS: 0 10*3/uL (ref 0.0–0.1)
EOS ABS: 0.2 10*3/uL (ref 0.0–0.5)
EOS%: 4 % (ref 0.0–7.0)
HEMATOCRIT: 28.7 % — AB (ref 34.8–46.6)
HGB: 8.9 g/dL — ABNORMAL LOW (ref 11.6–15.9)
LYMPH%: 32.7 % (ref 14.0–49.7)
MCH: 29.2 pg (ref 25.1–34.0)
MCHC: 31 g/dL — ABNORMAL LOW (ref 31.5–36.0)
MCV: 94.1 fL (ref 79.5–101.0)
MONO#: 0.4 10*3/uL (ref 0.1–0.9)
MONO%: 8.7 % (ref 0.0–14.0)
NEUT%: 54.2 % (ref 38.4–76.8)
NEUTROS ABS: 2.6 10*3/uL (ref 1.5–6.5)
PLATELETS: 275 10*3/uL (ref 145–400)
RBC: 3.05 10*6/uL — AB (ref 3.70–5.45)
RDW: 14.7 % — ABNORMAL HIGH (ref 11.2–14.5)
WBC: 4.7 10*3/uL (ref 3.9–10.3)
lymph#: 1.5 10*3/uL (ref 0.9–3.3)

## 2014-04-06 MED ORDER — FULVESTRANT 250 MG/5ML IM SOLN
500.0000 mg | Freq: Once | INTRAMUSCULAR | Status: AC
  Administered 2014-04-06: 500 mg via INTRAMUSCULAR
  Filled 2014-04-06: qty 10

## 2014-04-06 MED ORDER — HEPARIN SOD (PORK) LOCK FLUSH 100 UNIT/ML IV SOLN
500.0000 [IU] | Freq: Once | INTRAVENOUS | Status: AC
  Administered 2014-04-06: 500 [IU] via INTRAVENOUS
  Filled 2014-04-06: qty 5

## 2014-04-06 MED ORDER — CYANOCOBALAMIN 1000 MCG/ML IJ SOLN
1000.0000 ug | Freq: Once | INTRAMUSCULAR | Status: AC
  Administered 2014-04-06: 1000 ug via INTRAMUSCULAR

## 2014-04-06 MED ORDER — GABAPENTIN 100 MG PO CAPS: 100.0000 mg | ORAL_CAPSULE | Freq: Three times a day (TID) | ORAL | Status: DC

## 2014-04-06 MED ORDER — DARBEPOETIN ALFA 200 MCG/0.4ML IJ SOSY
200.0000 ug | PREFILLED_SYRINGE | Freq: Once | INTRAMUSCULAR | Status: AC
  Administered 2014-04-06: 200 ug via SUBCUTANEOUS
  Filled 2014-04-06: qty 0.4

## 2014-04-06 MED ORDER — SODIUM CHLORIDE 0.9 % IJ SOLN
10.0000 mL | INTRAMUSCULAR | Status: DC | PRN
  Administered 2014-04-06: 10 mL via INTRAVENOUS
  Filled 2014-04-06: qty 10

## 2014-04-06 NOTE — Telephone Encounter (Signed)
Lab and inj added,printed a new sch and mailed to pt

## 2014-04-06 NOTE — Progress Notes (Signed)
Patient ID: Brittney Cunningham, female   DOB: [birth year], 74 y.o.   MRN: [id number]  [place]  [phone] [phone]  OFFICE PROGRESS NOTE   ID: KHALI PERELLA   DOB: 1940-09-21  MR#: [id number]  ERX#:[id number]   PCP: Penni Homans, MD SU: Rolm Bookbinder OTHER MD: Teena Dunk, Kyung Rudd, Kevin Supple, Peter Martinique  CHIEF COMPLAINT:  Metastatic Breast Cancer CURRENT TREATMENT: fulvestrant, aranesp, B-12  HISTORY OF PRESENT ILLNESS: Brittney Cunningham was diagnosed with right breast carcinoma in May of 2011, a biopsy showing a grade 2 invasive ductal carcinoma, T2 NXM1, stage IV at presentation. There was bone only involvement. Tumor was strongly ER and PR positive, HER-2/neu negative, with an MIP-1 of 26%.  She was treated neoadjuvantly with letrozole and zoledronic acid beginning in June of 2011. She is status post right modified radical mastectomy in February 2012 for a ypT2 ypN2, grade 1 invasive ductal carcinoma. Margins were negative.   Her subsequent history is as detailed below   INTERVAL HISTORY: Brittney Cunningham returns today for follow up of her metastatic breast cancer as well as her B12 deficiency and anemia of renal failure. She continues on vitamin B, fulvestrant, and aranesp monthly. She tolerates this treatment well with no complaints. She continues to care for her husband who was diagnosed with multiple myeloma and this is very stressful for her not only mentally, but physically as well. She does not sleep well and is up several times nightly. This makes her fatigued during the day.   REVIEW OF SYSTEMS: Nelli denies fevers, chills, nausea, or vomiting. She takes miralax and stool softeners daily to avoid constipation. She is on MS Contin for her shoulder pain from her metastases. She has a new burning pain to her right arm and left shoulder and sometimes into her neck. She is short of breath with exertion. She denies headaches, dizziness, unexplained weight  loss, or night sweats. A detailed review of systems is stable.  PAST MEDICAL HISTORY: Past Medical History  Diagnosis Date  . Anemia   . Depression   . Diabetes mellitus type II   . GERD (gastroesophageal reflux disease)   . Hyperlipidemia   . Hypothyroidism   . Osteoarthritis   . Carotid artery stenosis     bilateral  . CAD (coronary artery disease)     medical therapy, old tot RCA, patent LAD, mod. severe ostial LCX stenosis  . OSA (obstructive sleep apnea)   . Breast cancer     right breast cancer-invasive  ductal carcinoma (StageIV)  . Hypertension   . Anxiety   . Carotid artery occlusion   . Unspecified constipation 03/15/2012  . Bursitis   . Collagen vascular disease   . History of radiation therapy 10/31/12-11/13/12    rt&lt humerous 30Gy/31fx  . Valvular heart disease 07/11/2013  . Diarrhea 09/28/2013    PAST SURGICAL HISTORY: Past Surgical History  Procedure Laterality Date  . Incisional breast biopsy      remoted left breast biopsy  . Cesarean section    . Other surgical history      GYN surgery  . Knee surgery      right knee surgery  . Carotid endarterectomy      right carotid  . Port-a-cath removal    . Modified mastectomy      Right breast  . Breast surgery    . Mastectomy    . Carotid stent      left  . Cardiac catheterization  06/2013  old total occ. of RCA, moderately severe ostial LCX stenosis- medical therapy- would be complex PCI  . Carotid angiogram N/A 03/01/2011    Procedure: CAROTID ANGIOGRAM;  Surgeon: Conrad Thorsby, MD;  Location: [facility] CATH LAB;  Service: Cardiovascular;  Laterality: N/A;  . Arch aortogram  03/01/2011    Procedure: ARCH AORTOGRAM;  Surgeon: Conrad Nescatunga, MD;  Location: [facility] CATH LAB;  Service: Cardiovascular;;  . Carotid stent insertion Left 03/15/2011    Procedure: CAROTID STENT INSERTION;  Surgeon: Serafina Mitchell, MD;  Location: [facility] CATH LAB;  Service: Cardiovascular;  Laterality: Left;  . Left heart catheterization with  coronary angiogram N/A 07/07/2013    Procedure: LEFT HEART CATHETERIZATION WITH CORONARY ANGIOGRAM;  Surgeon: Blane Ohara, MD;  Location: [facility] CATH LAB;  Service: Cardiovascular;  Laterality: N/A;    FAMILY HISTORY Family History  Problem Relation Age of Onset  . Arthritis    . Coronary artery disease      first degree relative  . Stroke      first degree relative  . Diabetes Mother   . Heart disease Mother   . Hypertension Mother   . Cancer Mother   . Deep vein thrombosis Mother   . Colon cancer Neg Hx   . Stomach cancer Neg Hx   . Cancer Father     Pancreatic cancer  . Breast cancer Paternal Aunt 70  The patient's father died from pancreatic cancer at the age of 49. The patient's mother died from complications of diabetes and heart disease at the age of 15. The patient has 2 sisters and 2 brothers. There is no history of breast cancer or ovarian cancer in the immediate family. One of the patient's paternal aunts out of 6 paternal aunts had breast cancer diagnosed in her 67s.    GYNECOLOGIC HISTORY: The patient is GX, P3. First pregnancy to term age 16. She went through the change of life in her late 35s. She never took hormones.   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 Her husband, Brittney Cunningham, is disabled secondary to a fall.  He has difficulty with walking. He used to work for [employer] previously. The patient's son, Brittney Cunningham, lives in [place], and works for [employer]. Daughter, Brittney Cunningham, lives in [place], and works for [employer]. Daughter, Brittney Cunningham, lives in [place], [place], and works as a Geologist, engineering. The patient attends [employer].  ADVANCED DIRECTIVES: Not on file  HEALTH MAINTENANCE:  (Updated 04/23/2013) History  Substance Use Topics  . Smoking status: Former Smoker -- 1.00 packs/day for 20 years    Types: Cigarettes    Quit date: 01/31/1988  . Smokeless tobacco: Never Used   . Alcohol Use: No    Colonoscopy: Not on file  PAP: Not on file  Bone density: July 2011, Normal  Lipid panel: 06/26/2012  Allergies  Allergen Reactions  . Chlorhexidine Hives, Itching and Rash    This was most likely a CONTACT DERMATITIS versus true systemic allergic reaction  . Adhesive [Tape] Hives  . Codeine Swelling    Current Outpatient Prescriptions  Medication Sig Dispense Refill  . aspirin 81 MG chewable tablet Chew 81 mg by mouth daily.    Marland Kitchen atorvastatin (LIPITOR) 80 MG tablet Take 1 tablet (80 mg total) by mouth daily. 30 tablet 1  . Blood Glucose Monitoring Suppl (ACCU-CHEK NANO SMARTVIEW) W/DEVICE KIT Use to check blood sugar twice a day. DX 250.00 1  kit 0  . carvedilol (COREG) 25 MG tablet Take 1 tablet (25 mg total) by mouth 2 (two) times daily with a meal. 60 tablet 6  . clopidogrel (PLAVIX) 75 MG tablet Take 1 tablet (75 mg total) by mouth daily. 30 tablet 8  . cyanocobalamin (,VITAMIN B-12,) 1000 MCG/ML injection Inject 1,000 mcg into the muscle every 30 (thirty) days. Last dose 03/09/2011    . darbepoetin (ARANESP) 200 MCG/0.4ML SOLN Inject 200 mcg into the skin once. Every 30 days    . glucose blood (ACCU-CHEK AVIVA) test strip Use as instructed to check blood sugar twice a day  Dx 250.00 100 each 3  . glycerin adult (GLYCERIN ADULT) 2 G SUPP Place 1 suppository rectally once as needed (constipation). 10 suppository 0  . hydrochlorothiazide (MICROZIDE) 12.5 MG capsule Take 1 capsule (12.5 mg total) by mouth daily. 30 capsule 3  . insulin glargine (LANTUS) 100 UNIT/ML injection Inject 0.6 mLs (60 Units total) into the skin at bedtime. Pt wants vials. 30 mL 1  . isosorbide mononitrate (IMDUR) 60 MG 24 hr tablet Take 2 tablets (120 mg total) by mouth daily. 60 tablet 11  . levothyroxine (SYNTHROID, LEVOTHROID) 75 MCG tablet Take 1 tablet (75 mcg total) by mouth daily before breakfast. (Patient taking differently: Take 75 mcg by mouth daily before breakfast. Pt takes 1  tablet one daily and two the next day alternating) 30 tablet 1  . metFORMIN (GLUCOPHAGE) 500 MG tablet Take 1 tablet (500 mg total) by mouth every morning. 30 tablet 5  . morphine (MS CONTIN) 30 MG 12 hr tablet Take 1 tablet (30 mg total) by mouth 2 (two) times daily. 60 tablet 0  . polyethylene glycol (MIRALAX / GLYCOLAX) packet Take 17 g by mouth daily as needed.    . gabapentin (NEURONTIN) 100 MG capsule Take 1 capsule (100 mg total) by mouth 3 (three) times daily. 90 capsule 2  . nitroGLYCERIN (NITROSTAT) 0.4 MG SL tablet Place 1 tablet (0.4 mg total) under the tongue every 5 (five) minutes as needed for chest pain. (Patient not taking: Reported on 04/06/2014) 30 tablet 0  . omeprazole (PRILOSEC) 40 MG capsule Take 1 capsule (40 mg total) by mouth daily. (Patient not taking: Reported on 04/06/2014) 30 capsule 3  . [DISCONTINUED] simvastatin (ZOCOR) 40 MG tablet Take 40 mg by mouth every evening.     No current facility-administered medications for this visit.    OBJECTIVE: Elderly white woman  Who appears stated age 27 Vitals:   04/06/14 0947  BP: 156/76  Pulse: 68  Temp: 98.1 F (36.7 C)  Resp: 18     Body mass index is 32.89 kg/(m^2).    ECOG FS: 1 Filed Weights   04/06/14 0947  Weight: 185 lb 9.6 oz (84.188 kg)    Skin: warm, dry  HEENT: sclerae anicteric, conjunctivae pink, oropharynx clear. No thrush or mucositis.  Lymph Nodes: No cervical or supraclavicular lymphadenopathy  Lungs: clear to auscultation bilaterally, no rales, wheezes, or rhonci  Heart: regular rate and rhythm  Abdomen: round, soft, non tender, positive bowel sounds  Musculoskeletal: No focal spinal tenderness, +1 edema to right upper arm Neuro: non focal, well oriented, positive affect  Breast: right breast status post mastectomy. No evidence of recurrent disease. Right axilla benign. Left breast unremarkable.  LAB RESULTS: Lab Results  Component Value Date   WBC 4.7 04/06/2014   NEUTROABS 2.6  04/06/2014   HGB 8.9* 04/06/2014   HCT 28.7* 04/06/2014   MCV  94.1 04/06/2014   PLT 275 04/06/2014      Chemistry      Component Value Date/Time   NA 136 04/06/2014 0854   NA 134* 03/02/2014 1158   K 4.9 04/06/2014 0854   K 4.9 03/02/2014 1158   CL 101 03/02/2014 1158   CL 104 07/08/2012 1023   CO2 23 04/06/2014 0854   CO2 24 03/02/2014 1158   BUN 33.2* 04/06/2014 0854   BUN 27* 03/02/2014 1158   CREATININE 1.3* 04/06/2014 0854   CREATININE 1.25* 03/02/2014 1158   CREATININE 1.20* 09/26/2013 1331      Component Value Date/Time   CALCIUM 9.3 04/06/2014 0854   CALCIUM 9.1 03/02/2014 1158   ALKPHOS 158* 04/06/2014 0854   ALKPHOS 120* 03/02/2014 1158   AST 17 04/06/2014 0854   AST 19 03/02/2014 1158   ALT <6 04/06/2014 0854   ALT 9 03/02/2014 1158   BILITOT 0.24 04/06/2014 0854   BILITOT 0.4 03/02/2014 1158      STUDIES: No results found.  ASSESSMENT: 74 y.o. [place] woman:   (1) Status post right breast biopsy in 05/2009 for a grade 2 invasive ductal carcinoma, T2 NX M1, Stage IV, with bone-only involvement, strongly estrogen and progesterone receptor-positive, HER-2-negative with an MIB-1 of 26%.   (2) Neoadjuvantly she received Letrozole and zoledronic acid beginning in June of 2011 and underwent right modified radical mastectomy February of 2012 for a ypT2 ypN2, grade 1 invasive ductal carcinoma with negative margins.   (3) She completed radiation therapy in August of 2012.   (4) She has continued on Letrozole but was switched from Zoledronic acid to Denosumab Delton See) because of concerns regarding her serum creatinine. Delton See was being given every 8 weeks.  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  (6) symptomatic anemia, with creatinine clearance < 60 cc/min; Darbepoietin Q14d started 11/03/2011; received Feraheme 01/05/2012  (7) On subcutaneous B-12 supplementation monthly.  (8) Pain in left upper extremity with  osseous metastasis, osteoarthritis, tendinopathy, and bursitis as confirmed by recent MRI.  (9) Anemia, multifactorial with renal disease, on Aranesp monthly  (10)  letrozole was discontinued in September 2014 with evidence of disease progression. She was started on fulvestrant injections, first given on 10/17/2012.   PLAN: Jeaneane has not had restaging scans in some time. I have orders an MRI to the chest to be performed this week. She has a history of a soft disc protrusion at C6-7 which might affect the left C7 nerve captured on a previous MRI. The pain she is describing to her neck and shoulder could arise from this area. We discussed starting on gabapentin for this nerve pain, and she was agreeable.   She will continue on B-12, aranesp, and fulvestrant monthly. Her hgb was 8.9 today. She will follow up with lab and an office visit in 2 months. She will continue to have her port flushed every 6 weeks. She understands and agrees with this plan. She knows the goal of treatment in her case is control. She has been encouraged to call with any issues that might arise before her next visit here.  Laurie Panda, NP  04/06/2014  5:15 PM

## 2014-04-06 NOTE — Telephone Encounter (Signed)
appts made and avs printed for pt  Brittney Cunningham °

## 2014-04-08 ENCOUNTER — Encounter: Payer: Self-pay | Admitting: Cardiology

## 2014-04-08 ENCOUNTER — Ambulatory Visit (INDEPENDENT_AMBULATORY_CARE_PROVIDER_SITE_OTHER): Payer: Medicare Other | Admitting: Cardiology

## 2014-04-08 ENCOUNTER — Encounter: Payer: Self-pay | Admitting: Nurse Practitioner

## 2014-04-08 VITALS — BP 130/54 | HR 68 | Ht 63.0 in | Wt 186.8 lb

## 2014-04-08 DIAGNOSIS — I2510 Atherosclerotic heart disease of native coronary artery without angina pectoris: Secondary | ICD-10-CM

## 2014-04-08 DIAGNOSIS — Z79899 Other long term (current) drug therapy: Secondary | ICD-10-CM

## 2014-04-08 MED ORDER — HYDROCHLOROTHIAZIDE 12.5 MG PO CAPS: 25.0000 mg | ORAL_CAPSULE | Freq: Every day | ORAL | Status: DC

## 2014-04-08 NOTE — Progress Notes (Signed)
Cardiology Office Note   Date:  04/08/2014   ID:  Brittney Cunningham, DOB 1940/07/29, MRN [id number]  PCP:  Danise Edge, MD  Cardiologist:  Dr. P. [place]     Chief Complaint  Patient presents with  . Coronary Artery Disease    No complaints of chest pain.  Occas. SOB with activity, mild edema.  Mild dizzines since starting Gabapentin which was started Monday.Marland Kitchen      History of Present Illness: Brittney Cunningham is a 74 y.o. female who presents for monitoring her CAD.  She has a history of known coronary artery disease, carotid artery stenosis status post right carotid endarterectomy and stenting in the left carotid, diabetes, hypertension, dyslipidemia, stage IV breast cancer who presented to [facility] with chest pain-07/05/13 and SOB.  Neg. MI, cardiac cath revealed  Total occlusion of the RCA, chronic  2. Patency of the left main and LAD  3. Moderately severe ostial left circumflex stenosis. PCI of LCX would be complex so medical therapy planned.  Imdur added to medications with improvement. Today she is here for follow up. Still with occ chest pressure and DOE. No associated symptoms of nausea or diaphoresis.  She has increased her salt intake recently her husband has been diagnosed with multiple myeloma and he needed more salt in his diet and she is been adding it to the food. We discussed cooking without salt loading him and his own salts his food. She does complain of fatigue and some dyspnea on exertion if she overdoes it. Does have to rest for completely go away.   Past Medical History  Diagnosis Date  . Anemia   . Depression   . Diabetes mellitus type II   . GERD (gastroesophageal reflux disease)   . Hyperlipidemia   . Hypothyroidism   . Osteoarthritis   . Carotid artery stenosis     bilateral  . CAD (coronary artery disease)     medical therapy, old tot RCA, patent LAD, mod. severe ostial LCX stenosis  . OSA (obstructive sleep apnea)   . Breast cancer     right breast  cancer-invasive  ductal carcinoma (StageIV)  . Hypertension   . Anxiety   . Carotid artery occlusion   . Unspecified constipation 03/15/2012  . Bursitis   . Collagen vascular disease   . History of radiation therapy 10/31/12-11/13/12    rt&lt humerous 30Gy/40fx  . Valvular heart disease 07/11/2013  . Diarrhea 09/28/2013    Past Surgical History  Procedure Laterality Date  . Incisional breast biopsy      remoted left breast biopsy  . Cesarean section    . Other surgical history      GYN surgery  . Knee surgery      right knee surgery  . Carotid endarterectomy      right carotid  . Port-a-cath removal    . Modified mastectomy      Right breast  . Breast surgery    . Mastectomy    . Carotid stent      left  . Cardiac catheterization  06/2013    old total occ. of RCA, moderately severe ostial LCX stenosis- medical therapy- would be complex PCI  . Carotid angiogram N/A 03/01/2011    Procedure: CAROTID ANGIOGRAM;  Surgeon: Fransisco Hertz, MD;  Location: [facility] CATH LAB;  Service: Cardiovascular;  Laterality: N/A;  . Arch aortogram  03/01/2011    Procedure: ARCH AORTOGRAM;  Surgeon: Fransisco Hertz, MD;  Location: [facility] CATH LAB;  Service: Cardiovascular;;  . Carotid stent insertion Left 03/15/2011    Procedure: CAROTID STENT INSERTION;  Surgeon: Serafina Mitchell, MD;  Location: [facility] CATH LAB;  Service: Cardiovascular;  Laterality: Left;  . Left heart catheterization with coronary angiogram N/A 07/07/2013    Procedure: LEFT HEART CATHETERIZATION WITH CORONARY ANGIOGRAM;  Surgeon: Blane Ohara, MD;  Location: [facility] CATH LAB;  Service: Cardiovascular;  Laterality: N/A;     Current Outpatient Prescriptions  Medication Sig Dispense Refill  . aspirin 81 MG chewable tablet Chew 81 mg by mouth daily.    Marland Kitchen atorvastatin (LIPITOR) 80 MG tablet Take 1 tablet (80 mg total) by mouth daily. 30 tablet 1  . Blood Glucose Monitoring Suppl (ACCU-CHEK NANO SMARTVIEW) W/DEVICE KIT Use to check blood sugar twice a day.  DX 250.00 1 kit 0  . carvedilol (COREG) 25 MG tablet Take 1 tablet (25 mg total) by mouth 2 (two) times daily with a meal. 60 tablet 6  . clopidogrel (PLAVIX) 75 MG tablet Take 1 tablet (75 mg total) by mouth daily. 30 tablet 8  . cyanocobalamin (,VITAMIN B-12,) 1000 MCG/ML injection Inject 1,000 mcg into the muscle every 30 (thirty) days. Last dose 03/09/2011    . darbepoetin (ARANESP) 200 MCG/0.4ML SOLN Inject 200 mcg into the skin once. Every 30 days    . gabapentin (NEURONTIN) 100 MG capsule Take 1 capsule (100 mg total) by mouth 3 (three) times daily. 90 capsule 2  . glucose blood (ACCU-CHEK AVIVA) test strip Use as instructed to check blood sugar twice a day  Dx 250.00 100 each 3  . glycerin adult (GLYCERIN ADULT) 2 G SUPP Place 1 suppository rectally once as needed (constipation). 10 suppository 0  . hydrochlorothiazide (MICROZIDE) 12.5 MG capsule Take 1 capsule (12.5 mg total) by mouth daily. 30 capsule 3  . insulin glargine (LANTUS) 100 UNIT/ML injection Inject 0.6 mLs (60 Units total) into the skin at bedtime. Pt wants vials. 30 mL 1  . isosorbide mononitrate (IMDUR) 60 MG 24 hr tablet Take 2 tablets (120 mg total) by mouth daily. 60 tablet 11  . levothyroxine (SYNTHROID, LEVOTHROID) 75 MCG tablet Take 1 tablet (75 mcg total) by mouth daily before breakfast. (Patient taking differently: Take 75 mcg by mouth daily before breakfast. Pt takes 1 tablet one daily and two the next day alternating) 30 tablet 1  . metFORMIN (GLUCOPHAGE) 500 MG tablet Take 1 tablet (500 mg total) by mouth every morning. 30 tablet 5  . morphine (MS CONTIN) 30 MG 12 hr tablet Take 1 tablet (30 mg total) by mouth 2 (two) times daily. 60 tablet 0  . nitroGLYCERIN (NITROSTAT) 0.4 MG SL tablet Place 1 tablet (0.4 mg total) under the tongue every 5 (five) minutes as needed for chest pain. 30 tablet 0  . omeprazole (PRILOSEC) 40 MG capsule Take 1 capsule (40 mg total) by mouth daily. 30 capsule 3  . polyethylene glycol  (MIRALAX / GLYCOLAX) packet Take 17 g by mouth daily as needed.    . [DISCONTINUED] simvastatin (ZOCOR) 40 MG tablet Take 40 mg by mouth every evening.     No current facility-administered medications for this visit.    Allergies:   Chlorhexidine; Adhesive; and Codeine    Social History:  The patient  reports that she quit smoking about 26 years ago. Her smoking use included Cigarettes. She has a 20 pack-year smoking history. She has never used smokeless tobacco. She reports that she does not drink alcohol or use illicit drugs.  Family History:  The patient's family history includes Arthritis in an other family member; Breast cancer (age of onset: 18) in her paternal aunt; Cancer in her father and mother; Coronary artery disease in an other family member; Deep vein thrombosis in her mother; Diabetes in her mother; Heart disease in her mother; Hypertension in her mother; Stroke in an other family member. There is no history of Colon cancer or Stomach cancer.    ROS:  General:no colds or fevers, no weight changes Skin:no rashes or ulcers HEENT:no blurred vision, no congestion CV:see HPI PUL:see HPI GI:no diarrhea constipation or melena, no indigestion GU:no hematuria, no dysuria MS:no joint pain, no claudication Neuro:no syncope, no lightheadedness Endo:+ diabetes- stable, + thyroid disease stable followed by PCP   Wt Readings from Last 3 Encounters:  04/08/14 186 lb 12.8 oz (84.732 kg)  04/06/14 185 lb 9.6 oz (84.188 kg)  03/02/14 188 lb (85.276 kg)     PHYSICAL EXAM: VS:  BP 130/54 mmHg  Pulse 68  Ht $R'5\' 3"'Dw$  (1.6 m)  Wt 186 lb 12.8 oz (84.732 kg)  BMI 33.10 kg/m2  LMP 03/13/2012 , BMI Body mass index is 33.1 kg/(m^2). General:Pleasant affect, NAD Skin:Warm and dry, brisk capillary refill HEENT:normocephalic, sclera clear, mucus membranes moist Neck:supple, no JVD, no bruits  Heart:S1S2 RRR without murmur, gallup, rub or click Lungs:clear without rales, rhonchi, or  wheezes KRC:VKFM, non tender, + BS, do not palpate liver spleen or masses Ext:tr to 1 + lower ext edema, 2+ pedal pulses, 2+ radial pulses Neuro:alert and oriented X 3, MAE, follows commands, + facial symmetry    EKG:  EKG is ordered today. The ekg ordered today demonstrates sinus rhythm rate of 68 normal EKG and no changes.   Recent Labs: 03/02/2014: TSH 41.07* 04/06/2014: ALT <6; BUN 33.2*; Creatinine 1.3*; Hemoglobin 8.9*; Platelets 275; Potassium 4.9; Sodium 136    Lipid Panel    Component Value Date/Time   CHOL 134 03/02/2014 1158   TRIG 151.0* 03/02/2014 1158   HDL 27.70* 03/02/2014 1158   CHOLHDL 5 03/02/2014 1158   VLDL 30.2 03/02/2014 1158   LDLCALC 76 03/02/2014 1158       Other studies Reviewed: Additional studies/ records that were reviewed today include: previous notes.   ASSESSMENT AND PLAN:  1. Coronary artery disease stable angina on and/or 120 mg daily with some volume overload known occlusion of the RCA which is chronic.  Follow-up with Dr. Martinique in 3 months  2. Hypertension controlled  3. Mild edema while blood pressure is stable and can tolerate low-dose diuretic hydrochlorothiazide 12-1/2 mg daily this may help her shortness of breath as well as decreasing her salt intake.  4. Hyperlipidemia stable on Lipitor 80   Current medicines are reviewed with the patient today.  The patient Has no concerns regarding medicines.  The following changes have been made:  See above Labs/ tests ordered today include:see above  Disposition:   FU:  see above  Signed, Isaiah Serge, NP  04/08/2014 10:19 AM    [facility] [place], [place], [address], [place] Phone: [phone]; Fax: [phone]

## 2014-04-08 NOTE — Patient Instructions (Signed)
Increase HCTZ to 25mg  daily.  Your physician recommends that you return for lab work in: 2-3 weeks.  Your physician recommends that you schedule a follow-up appointment in: 2-3 months with Dr. Peter Martinique.

## 2014-04-21 ENCOUNTER — Ambulatory Visit (HOSPITAL_COMMUNITY): Admission: RE | Admit: 2014-04-21 | Payer: Medicare Other | Source: Ambulatory Visit

## 2014-04-22 ENCOUNTER — Telehealth: Payer: Self-pay | Admitting: Family Medicine

## 2014-04-22 ENCOUNTER — Other Ambulatory Visit: Payer: Self-pay | Admitting: Nurse Practitioner

## 2014-04-22 MED ORDER — METFORMIN HCL 500 MG PO TABS: 500.0000 mg | ORAL_TABLET | Freq: Every morning | ORAL | Status: DC

## 2014-04-22 NOTE — Telephone Encounter (Signed)
Rx sent 

## 2014-04-22 NOTE — Telephone Encounter (Signed)
Caller name: Nelma Relation to pt: self Call back number: [phone] Pharmacy: [facility]  Reason for call:   Requesting metformin refill. Patient is out.

## 2014-04-30 ENCOUNTER — Encounter: Payer: Self-pay | Admitting: Family

## 2014-04-30 ENCOUNTER — Other Ambulatory Visit: Payer: Self-pay | Admitting: *Deleted

## 2014-04-30 ENCOUNTER — Telehealth: Payer: Self-pay | Admitting: *Deleted

## 2014-04-30 DIAGNOSIS — C7951 Secondary malignant neoplasm of bone: Principal | ICD-10-CM

## 2014-04-30 DIAGNOSIS — C50911 Malignant neoplasm of unspecified site of right female breast: Secondary | ICD-10-CM

## 2014-04-30 MED ORDER — MORPHINE SULFATE ER 30 MG PO TBCR: 30.0000 mg | EXTENDED_RELEASE_TABLET | Freq: Two times a day (BID) | ORAL | Status: DC

## 2014-04-30 NOTE — Telephone Encounter (Signed)
Patient called to request a refill of MS Contin 30 mg Q12 hours.  She would like to pick it up tomorrow morning.

## 2014-05-01 ENCOUNTER — Other Ambulatory Visit: Payer: Self-pay | Admitting: Surgery

## 2014-05-01 ENCOUNTER — Encounter: Payer: Self-pay | Admitting: Family

## 2014-05-01 ENCOUNTER — Ambulatory Visit (INDEPENDENT_AMBULATORY_CARE_PROVIDER_SITE_OTHER): Payer: Medicare Other | Admitting: Family

## 2014-05-01 ENCOUNTER — Ambulatory Visit (HOSPITAL_COMMUNITY)
Admission: RE | Admit: 2014-05-01 | Discharge: 2014-05-01 | Disposition: A | Discharge status: Home / Self Care | Payer: Medicare Other | Source: Ambulatory Visit | Attending: Family | Admitting: Family

## 2014-05-01 VITALS — BP 166/62 | HR 70 | Temp 98.6°F | Resp 16 | Ht 63.0 in | Wt 192.0 lb

## 2014-05-01 DIAGNOSIS — I6523 Occlusion and stenosis of bilateral carotid arteries: Secondary | ICD-10-CM

## 2014-05-01 DIAGNOSIS — Z9889 Other specified postprocedural states: Secondary | ICD-10-CM

## 2014-05-01 DIAGNOSIS — Z87891 Personal history of nicotine dependence: Secondary | ICD-10-CM | POA: Diagnosis not present

## 2014-05-01 DIAGNOSIS — Z48812 Encounter for surgical aftercare following surgery on the circulatory system: Secondary | ICD-10-CM

## 2014-05-01 DIAGNOSIS — E669 Obesity, unspecified: Secondary | ICD-10-CM

## 2014-05-01 HISTORY — PX: OTHER SURGICAL HISTORY: SHX169

## 2014-05-01 NOTE — Progress Notes (Signed)
Established Carotid Patient   History of Present Illness  Brittney Cunningham is a 74 y.o. female patient of Dr. Trula Slade is back today for followup. She is status post right carotid endarterectomy in 2011 and  left carotid stent in 2013.   The patient denies any history of TIA or stroke symptoms, specifically the patient denies a history of amaurosis fugax or monocular blindness, denies a history unilateral  of facial drooping, denies a history of hemiplegia, and denies a history of receptive or expressive aphasia.    The patient has been diagnosed with anemia. She does feel weak at times but is trying to work for on this. She continues to take an aspirin and Plavix. She continues to take her statins for her hypercholesterolemia.  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  Her blood pressure is elevated today and states it has been lately due to her stress.  Her husband has multiple myeloma and hospitalized several times recently, had some syncope, pt is dealing with this also.   Pt Diabetic: Yes, states not in good control Pt smoker: former smoker, quit in 1998  Pt meds include: Statin : Yes ASA: Yes Other anticoagulants/antiplatelets: Plavix, started by Dr. Trula Slade   Past Medical History  Diagnosis Date  . Anemia   . Depression   . Diabetes mellitus type II   . GERD (gastroesophageal reflux disease)   . Hyperlipidemia   . Hypothyroidism   . Osteoarthritis   . Carotid artery stenosis     bilateral  . CAD (coronary artery disease)     medical therapy, old tot RCA, patent LAD, mod. severe ostial LCX stenosis  . OSA (obstructive sleep apnea)   . Breast cancer     right breast cancer-invasive  ductal carcinoma  (StageIV)  . Hypertension   . Anxiety   . Carotid artery occlusion   . Unspecified constipation 03/15/2012  . Bursitis   . Collagen vascular disease   . History of radiation therapy 10/31/12-11/13/12    rt&lt humerous 30Gy/4f  . Valvular heart disease 07/11/2013  . Diarrhea 09/28/2013    Social History History  Substance Use Topics  . Smoking status: Former Smoker -- 1.00 packs/day for 20 years    Types: Cigarettes    Quit date: 01/31/1988  . Smokeless tobacco: Never Used  . Alcohol Use: No    Family History Family History  Problem Relation Age of Onset  . Arthritis    . Coronary artery disease      first degree relative  . Stroke      first degree relative  . Diabetes Mother   . Heart disease Mother   . Hypertension Mother   . Cancer Mother   . Deep vein thrombosis Mother   . Colon cancer Neg Hx   . Stomach cancer Neg Hx   . Cancer Father     Pancreatic cancer  . Breast cancer Paternal Aunt 422   Surgical History Past Surgical History  Procedure Laterality Date  . Incisional breast biopsy      remoted left breast biopsy  . Cesarean section    . Other surgical history      GYN surgery  . Knee surgery  right knee surgery  . Carotid endarterectomy      right carotid  . Port-a-cath removal    . Modified mastectomy      Right breast  . Breast surgery    . Mastectomy    . Carotid stent      left  . Cardiac catheterization  06/2013    old total occ. of RCA, moderately severe ostial LCX stenosis- medical therapy- would be complex PCI  . Carotid angiogram N/A 03/01/2011    Procedure: CAROTID ANGIOGRAM;  Surgeon: Conrad Regan, MD;  Location: [facility] CATH LAB;  Service: Cardiovascular;  Laterality: N/A;  . Arch aortogram  03/01/2011    Procedure: ARCH AORTOGRAM;  Surgeon: Conrad Oswego, MD;  Location: [facility] CATH LAB;  Service: Cardiovascular;;  . Carotid stent insertion Left 03/15/2011    Procedure: CAROTID STENT INSERTION;  Surgeon: Serafina Mitchell, MD;  Location: [facility]  CATH LAB;  Service: Cardiovascular;  Laterality: Left;  . Left heart catheterization with coronary angiogram N/A 07/07/2013    Procedure: LEFT HEART CATHETERIZATION WITH CORONARY ANGIOGRAM;  Surgeon: Blane Ohara, MD;  Location: [facility] CATH LAB;  Service: Cardiovascular;  Laterality: N/A;    Allergies  Allergen Reactions  . Chlorhexidine Hives, Itching and Rash    This was most likely a CONTACT DERMATITIS versus true systemic allergic reaction  . Adhesive [Tape] Hives  . Codeine Swelling    Current Outpatient Prescriptions  Medication Sig Dispense Refill  . aspirin 81 MG chewable tablet Chew 81 mg by mouth daily.    Marland Kitchen atorvastatin (LIPITOR) 80 MG tablet Take 1 tablet (80 mg total) by mouth daily. 30 tablet 1  . Blood Glucose Monitoring Suppl (ACCU-CHEK NANO SMARTVIEW) W/DEVICE KIT Use to check blood sugar twice a day. DX 250.00 1 kit 0  . carvedilol (COREG) 25 MG tablet Take 1 tablet (25 mg total) by mouth 2 (two) times daily with a meal. 60 tablet 6  . clopidogrel (PLAVIX) 75 MG tablet Take 1 tablet (75 mg total) by mouth daily. 30 tablet 8  . cyanocobalamin (,VITAMIN B-12,) 1000 MCG/ML injection Inject 1,000 mcg into the muscle every 30 (thirty) days. Last dose 03/09/2011    . darbepoetin (ARANESP) 200 MCG/0.4ML SOLN Inject 200 mcg into the skin once. Every 30 days    . gabapentin (NEURONTIN) 100 MG capsule Take 1 capsule (100 mg total) by mouth 3 (three) times daily. 90 capsule 2  . glucose blood (ACCU-CHEK AVIVA) test strip Use as instructed to check blood sugar twice a day  Dx 250.00 100 each 3  . glycerin adult (GLYCERIN ADULT) 2 G SUPP Place 1 suppository rectally once as needed (constipation). 10 suppository 0  . hydrochlorothiazide (MICROZIDE) 12.5 MG capsule Take 2 capsules (25 mg total) by mouth daily. 60 capsule 5  . insulin glargine (LANTUS) 100 UNIT/ML injection Inject 0.6 mLs (60 Units total) into the skin at bedtime. Pt wants vials. 30 mL 1  . isosorbide mononitrate (IMDUR) 60  MG 24 hr tablet Take 2 tablets (120 mg total) by mouth daily. 60 tablet 11  . levothyroxine (SYNTHROID, LEVOTHROID) 75 MCG tablet Take 1 tablet (75 mcg total) by mouth daily before breakfast. (Patient taking differently: Take 75 mcg by mouth daily before breakfast. Pt takes 1 tablet one daily and two the next day alternating) 30 tablet 1  . metFORMIN (GLUCOPHAGE) 500 MG tablet Take 1 tablet (500 mg total) by mouth every morning. 30 tablet 5  . morphine (MS CONTIN) 30 MG 12  hr tablet Take 1 tablet (30 mg total) by mouth 2 (two) times daily. 60 tablet 0  . nitroGLYCERIN (NITROSTAT) 0.4 MG SL tablet Place 1 tablet (0.4 mg total) under the tongue every 5 (five) minutes as needed for chest pain. 30 tablet 0  . omeprazole (PRILOSEC) 40 MG capsule Take 1 capsule (40 mg total) by mouth daily. 30 capsule 3  . polyethylene glycol (MIRALAX / GLYCOLAX) packet Take 17 g by mouth daily as needed.    . [DISCONTINUED] simvastatin (ZOCOR) 40 MG tablet Take 40 mg by mouth every evening.     No current facility-administered medications for this visit.    Review of Systems : See HPI for pertinent positives and negatives.  Physical Examination  Filed Vitals:   05/01/14 1509 05/01/14 1516  BP: 159/65 166/62  Pulse: 64 70  Temp: 98.6 F (37 C)   TempSrc: Oral   Resp: 16   Height: _0  (1.6 m)   Weight: 192 lb (87.091 kg)   SpO2: 98%    Body mass index is 34.02 kg/(m^2).  General: WDWN female in NAD GAIT: slow and deliberate, using a cane Eyes: PERRLA Pulmonary:  Non-labored, CTAB, Negative  Rales, Negative rhonchi, & Negative wheezing.  Cardiac: regular Rhythm, + murmur.  VASCULAR EXAM Carotid Bruits Right Left   Positive Positive    Aorta is not palpable. Radial pulses are 1+ palpable and equal.                                                                                                                            LE Pulses Right Left       POPLITEAL  not palpable   not palpable        POSTERIOR TIBIAL  not palpable   not palpable        DORSALIS PEDIS      ANTERIOR TIBIAL faintly palpable  faintly palpable     Gastrointestinal: soft, nontender, BS WNL, no r/g,  negative palpated masses, large panus.  Musculoskeletal: Negative muscle atrophy/wasting. M/S 4/5 throughout, Extremities without ischemic changes.  Neurologic: A&O X 3; Appropriate Affect;  Speech is normal CN 2-12 intact, Pain and light touch intact in extremities, Motor exam as listed above.   Non-Invasive Vascular Imaging CAROTID DUPLEX 05/01/2014   CEREBROVASCULAR DUPLEX EVALUATION     INDICATION: Carotid artery stenosis     PREVIOUS INTERVENTION(S): Right carotid endarterectomy 05/28/2009; Left internal carotid artery stent placed 03/15/2011.    DUPLEX EXAM:     RIGHT  LEFT  Peak Systolic Velocities (cm/s) End Diastolic Velocities (cm/s) Plaque LOCATION Peak Systolic Velocities (cm/s) End Diastolic Velocities (cm/s) Plaque  123 12  CCA PROXIMAL 118 15   155 13  CCA MID 120 14   141 11 HM CCA DISTAL 97 15   239 0 HT ECA 299 0 HT  133 15 HM ICA PROXIMAL STENT STENT   90 19  ICA MID 82 15   80  18  ICA DISTAL 110 17     Carotid endarterectomy ICA / CCA Ratio (PSV) ICA stent  Retrograde Vertebral Flow Antegrade  Mastectomy Brachial Systolic Pressure (mmHg) NA  NA Brachial Artery Waveforms NA    Peak Systolic Velocities (cm/s) End Diastolic Velocities (cm/s) Plaque STENT (82/50/5397 LICA  ) Peak Systolic Velocities (cm/s) End Diastolic Velocities (cm/s) Plaque     PROXIMAL 98 12      MID 98 12      DISTAL 108 16     Plaque Morphology:  HM = Homogeneous, HT = Heterogeneous, CP = Calcific Plaque, SP = Smooth Plaque, IP = Irregular Plaque    ADDITIONAL FINDINGS:   IMPRESSION: Right internal carotid artery is patent with history of carotid endarterectomy mild hyperplasia present at the proximal mid patch without hemodynamically significant changes present. Left internal carotid artery stent  is patent, no hyperplasia or hemodynamically significant changes present. Bilateral external carotid artery stenosis. Abnormal retrograde flow involving the right retrograde vertebral artery.    Compared to the previous exam:  Essentially unchanged since previous study on 09/09/2012.      Assessment: AYDE RECORD is a 74 y.o. female who is status post right carotid endarterectomy in 2011 and  left carotid stent in 2013.   She has no history of stroke of TIA. Today's carotid Duplex reveals a patent right internal carotid artery with history of carotid endarterectomy, mild hyperplasia present at the proximal mid patch without hemodynamically significant changes. Left internal carotid artery stent is patent, no hyperplasia or hemodynamically significant changes present. Bilateral external carotid artery stenosis. Abnormal retrograde flow involving the right retrograde vertebral artery. Essentially unchanged since previous study on 09/09/2012.  Her atherosclerotic risk factors include DM, former smoker, and obesity.   Plan: Follow-up in 1 year with Carotid Duplex.  Pt advised to see her PCP ASAP re her elevated blood pressure.   I discussed in depth with the patient the nature of atherosclerosis, and emphasized the importance of maximal medical management including strict control of blood pressure, blood glucose, and lipid levels, obtaining regular exercise, and continued cessation of smoking.  The patient is aware that without maximal medical management the underlying atherosclerotic disease process will progress, limiting the benefit of any interventions. The patient was given information about stroke prevention and what symptoms should prompt the patient to seek immediate medical care. Thank you for allowing [place] to participate in this patient's care.  Clemon Chambers, RN, MSN, FNP-C [facility] Office: [phone]  Clinic Physician: Early on  call  05/01/2014 3:47 PM

## 2014-05-01 NOTE — Patient Instructions (Signed)
Stroke Prevention Some medical conditions and behaviors are associated with an increased chance of having a stroke. You may prevent a stroke by making healthy choices and managing medical conditions. HOW CAN I REDUCE MY RISK OF HAVING A STROKE?   Stay physically active. Get at least 30 minutes of activity on most or all days.  Do not smoke. It may also be helpful to avoid exposure to secondhand smoke.  Limit alcohol use. Moderate alcohol use is considered to be:  No more than 2 drinks per day for men.  No more than 1 drink per day for nonpregnant women.  Eat healthy foods. This involves:  Eating 5 or more servings of fruits and vegetables a day.  Making dietary changes that address high blood pressure (hypertension), high cholesterol, diabetes, or obesity.  Manage your cholesterol levels.  Making food choices that are high in fiber and low in saturated fat, trans fat, and cholesterol may control cholesterol levels.  Take any prescribed medicines to control cholesterol as directed by your health care provider.  Manage your diabetes.  Controlling your carbohydrate and sugar intake is recommended to manage diabetes.  Take any prescribed medicines to control diabetes as directed by your health care provider.  Control your hypertension.  Making food choices that are low in salt (sodium), saturated fat, trans fat, and cholesterol is recommended to manage hypertension.  Take any prescribed medicines to control hypertension as directed by your health care provider.  Maintain a healthy weight.  Reducing calorie intake and making food choices that are low in sodium, saturated fat, trans fat, and cholesterol are recommended to manage weight.  Stop drug abuse.  Avoid taking birth control pills.  Talk to your health care provider about the risks of taking birth control pills if you are over 35 years old, smoke, get migraines, or have ever had a blood clot.  Get evaluated for sleep  disorders (sleep apnea).  Talk to your health care provider about getting a sleep evaluation if you snore a lot or have excessive sleepiness.  Take medicines only as directed by your health care provider.  For some people, aspirin or blood thinners (anticoagulants) are helpful in reducing the risk of forming abnormal blood clots that can lead to stroke. If you have the irregular heart rhythm of atrial fibrillation, you should be on a blood thinner unless there is a good reason you cannot take them.  Understand all your medicine instructions.  Make sure that other conditions (such as anemia or atherosclerosis) are addressed. SEEK IMMEDIATE MEDICAL CARE IF:   You have sudden weakness or numbness of the face, arm, or leg, especially on one side of the body.  Your face or eyelid droops to one side.  You have sudden confusion.  You have trouble speaking (aphasia) or understanding.  You have sudden trouble seeing in one or both eyes.  You have sudden trouble walking.  You have dizziness.  You have a loss of balance or coordination.  You have a sudden, severe headache with no known cause.  You have new chest pain or an irregular heartbeat. Any of these symptoms may represent a serious problem that is an emergency. Do not wait to see if the symptoms will go away. Get medical help at once. Call your local emergency services (911 in [place] Do not drive yourself to the hospital. Document Released: 02/24/2004 Document Revised: 06/02/2013 Document Reviewed: 07/19/2012 [employer] Patient Information 2015 [employer]. This information is not intended to replace advice given   to you by your health care provider. Make sure you discuss any questions you have with your health care provider.  

## 2014-05-04 ENCOUNTER — Ambulatory Visit (HOSPITAL_BASED_OUTPATIENT_CLINIC_OR_DEPARTMENT_OTHER): Payer: Medicare Other

## 2014-05-04 ENCOUNTER — Other Ambulatory Visit (HOSPITAL_BASED_OUTPATIENT_CLINIC_OR_DEPARTMENT_OTHER): Payer: Medicare Other

## 2014-05-04 ENCOUNTER — Telehealth: Payer: Self-pay | Admitting: *Deleted

## 2014-05-04 ENCOUNTER — Other Ambulatory Visit: Payer: Self-pay | Admitting: *Deleted

## 2014-05-04 DIAGNOSIS — E538 Deficiency of other specified B group vitamins: Secondary | ICD-10-CM

## 2014-05-04 DIAGNOSIS — C50611 Malignant neoplasm of axillary tail of right female breast: Secondary | ICD-10-CM

## 2014-05-04 DIAGNOSIS — D649 Anemia, unspecified: Secondary | ICD-10-CM | POA: Diagnosis not present

## 2014-05-04 DIAGNOSIS — N19 Unspecified kidney failure: Secondary | ICD-10-CM | POA: Diagnosis not present

## 2014-05-04 DIAGNOSIS — K219 Gastro-esophageal reflux disease without esophagitis: Secondary | ICD-10-CM

## 2014-05-04 DIAGNOSIS — C7951 Secondary malignant neoplasm of bone: Principal | ICD-10-CM

## 2014-05-04 DIAGNOSIS — Z5111 Encounter for antineoplastic chemotherapy: Secondary | ICD-10-CM

## 2014-05-04 DIAGNOSIS — C50919 Malignant neoplasm of unspecified site of unspecified female breast: Secondary | ICD-10-CM

## 2014-05-04 DIAGNOSIS — C50911 Malignant neoplasm of unspecified site of right female breast: Secondary | ICD-10-CM

## 2014-05-04 LAB — COMPREHENSIVE METABOLIC PANEL (CC13)
ALT: 13 U/L (ref 0–55)
AST: 22 U/L (ref 5–34)
Albumin: 2.8 g/dL — ABNORMAL LOW (ref 3.5–5.0)
Alkaline Phosphatase: 166 U/L — ABNORMAL HIGH (ref 40–150)
Anion Gap: 13 mEq/L — ABNORMAL HIGH (ref 3–11)
BILIRUBIN TOTAL: 0.33 mg/dL (ref 0.20–1.20)
BUN: 24.5 mg/dL (ref 7.0–26.0)
CO2: 21 meq/L — AB (ref 22–29)
Calcium: 8.6 mg/dL (ref 8.4–10.4)
Chloride: 102 mEq/L (ref 98–109)
Creatinine: 1.2 mg/dL — ABNORMAL HIGH (ref 0.6–1.1)
EGFR: 48 mL/min/{1.73_m2} — ABNORMAL LOW (ref >90)
Glucose: 223 mg/dl — ABNORMAL HIGH (ref 70–140)
Potassium: 4.7 mEq/L (ref 3.5–5.1)
SODIUM: 136 meq/L (ref 136–145)
TOTAL PROTEIN: 6.7 g/dL (ref 6.4–8.3)

## 2014-05-04 LAB — CBC WITH DIFFERENTIAL/PLATELET
BASO%: 0.9 % (ref 0.0–2.0)
Basophils Absolute: 0 10*3/uL (ref 0.0–0.1)
EOS ABS: 0.2 10*3/uL (ref 0.0–0.5)
EOS%: 3.9 % (ref 0.0–7.0)
HCT: 24.6 % — ABNORMAL LOW (ref 34.8–46.6)
HEMOGLOBIN: 7.7 g/dL — AB (ref 11.6–15.9)
LYMPH%: 25.1 % (ref 14.0–49.7)
MCH: 28.6 pg (ref 25.1–34.0)
MCHC: 31.3 g/dL — ABNORMAL LOW (ref 31.5–36.0)
MCV: 91.6 fL (ref 79.5–101.0)
MONO#: 0.4 10*3/uL (ref 0.1–0.9)
MONO%: 8.1 % (ref 0.0–14.0)
NEUT%: 62 % (ref 38.4–76.8)
NEUTROS ABS: 3.4 10*3/uL (ref 1.5–6.5)
Platelets: 308 10*3/uL (ref 145–400)
RBC: 2.68 10*6/uL — AB (ref 3.70–5.45)
RDW: 16.4 % — AB (ref 11.2–14.5)
WBC: 5.4 10*3/uL (ref 3.9–10.3)
lymph#: 1.4 10*3/uL (ref 0.9–3.3)

## 2014-05-04 MED ORDER — DARBEPOETIN ALFA 200 MCG/0.4ML IJ SOSY
200.0000 ug | PREFILLED_SYRINGE | Freq: Once | INTRAMUSCULAR | Status: AC
  Administered 2014-05-04: 200 ug via SUBCUTANEOUS
  Filled 2014-05-04: qty 0.4

## 2014-05-04 MED ORDER — CYANOCOBALAMIN 1000 MCG/ML IJ SOLN
1000.0000 ug | Freq: Once | INTRAMUSCULAR | Status: AC
  Administered 2014-05-04: 1000 ug via INTRAMUSCULAR

## 2014-05-04 MED ORDER — FULVESTRANT 250 MG/5ML IM SOLN
500.0000 mg | Freq: Once | INTRAMUSCULAR | Status: AC
  Administered 2014-05-04: 500 mg via INTRAMUSCULAR
  Filled 2014-05-04: qty 10

## 2014-05-04 NOTE — Telephone Encounter (Signed)
Per staff message and POF I have scheduled appts. Advised scheduler of appts. JMW  

## 2014-05-04 NOTE — Addendum Note (Signed)
Addended by: Dorthula Rue L on: 05/04/2014 01:24 PM   Modules accepted: Orders

## 2014-05-05 ENCOUNTER — Telehealth: Payer: Self-pay | Admitting: Nurse Practitioner

## 2014-05-05 NOTE — Telephone Encounter (Signed)
S/w pt confirming labs/blood transfusion per 04/04 POF..... KJ

## 2014-05-07 ENCOUNTER — Other Ambulatory Visit: Payer: Self-pay | Admitting: Nurse Practitioner

## 2014-05-07 ENCOUNTER — Other Ambulatory Visit: Payer: Self-pay | Admitting: *Deleted

## 2014-05-07 ENCOUNTER — Ambulatory Visit (HOSPITAL_COMMUNITY)
Admission: RE | Admit: 2014-05-07 | Discharge: 2014-05-07 | Disposition: A | Discharge status: Home / Self Care | Payer: Medicare Other | Source: Ambulatory Visit | Attending: Oncology | Admitting: Oncology

## 2014-05-07 DIAGNOSIS — D649 Anemia, unspecified: Secondary | ICD-10-CM | POA: Insufficient documentation

## 2014-05-08 ENCOUNTER — Other Ambulatory Visit: Payer: Self-pay | Admitting: *Deleted

## 2014-05-08 ENCOUNTER — Other Ambulatory Visit: Payer: Medicare Other

## 2014-05-08 ENCOUNTER — Ambulatory Visit (HOSPITAL_BASED_OUTPATIENT_CLINIC_OR_DEPARTMENT_OTHER): Payer: Medicare Other

## 2014-05-08 VITALS — BP 171/51 | HR 66 | Temp 98.3°F | Resp 20

## 2014-05-08 DIAGNOSIS — D649 Anemia, unspecified: Secondary | ICD-10-CM | POA: Diagnosis present

## 2014-05-08 DIAGNOSIS — C50911 Malignant neoplasm of unspecified site of right female breast: Secondary | ICD-10-CM

## 2014-05-08 DIAGNOSIS — C7951 Secondary malignant neoplasm of bone: Principal | ICD-10-CM

## 2014-05-08 LAB — PREPARE RBC (CROSSMATCH)

## 2014-05-08 MED ORDER — HEPARIN SOD (PORK) LOCK FLUSH 100 UNIT/ML IV SOLN
500.0000 [IU] | Freq: Every day | INTRAVENOUS | Status: AC | PRN
  Administered 2014-05-08: 500 [IU]
  Filled 2014-05-08: qty 5

## 2014-05-08 MED ORDER — ACETAMINOPHEN 325 MG PO TABS
ORAL_TABLET | ORAL | Status: AC
  Filled 2014-05-08: qty 2

## 2014-05-08 MED ORDER — SODIUM CHLORIDE 0.9 % IJ SOLN
10.0000 mL | INTRAMUSCULAR | Status: AC | PRN
  Administered 2014-05-08: 10 mL
  Filled 2014-05-08: qty 10

## 2014-05-08 MED ORDER — ACETAMINOPHEN 325 MG PO TABS
650.0000 mg | ORAL_TABLET | Freq: Once | ORAL | Status: AC
  Administered 2014-05-08: 650 mg via ORAL

## 2014-05-08 MED ORDER — DIPHENHYDRAMINE HCL 25 MG PO CAPS
ORAL_CAPSULE | ORAL | Status: AC
  Filled 2014-05-08: qty 1

## 2014-05-08 MED ORDER — SODIUM CHLORIDE 0.9 % IV SOLN
250.0000 mL | Freq: Once | INTRAVENOUS | Status: AC
  Administered 2014-05-08: 250 mL via INTRAVENOUS

## 2014-05-08 MED ORDER — DIPHENHYDRAMINE HCL 25 MG PO CAPS
25.0000 mg | ORAL_CAPSULE | Freq: Once | ORAL | Status: AC
  Administered 2014-05-08: 25 mg via ORAL

## 2014-05-08 NOTE — Patient Instructions (Signed)

## 2014-05-09 ENCOUNTER — Emergency Department (HOSPITAL_COMMUNITY)
Admission: EM | Admit: 2014-05-09 | Discharge: 2014-05-09 | Disposition: A | Discharge status: Home / Self Care | Payer: Medicare Other | Attending: Emergency Medicine | Admitting: Emergency Medicine

## 2014-05-09 ENCOUNTER — Encounter (HOSPITAL_COMMUNITY): Payer: Self-pay | Admitting: Emergency Medicine

## 2014-05-09 DIAGNOSIS — K5900 Constipation, unspecified: Secondary | ICD-10-CM | POA: Insufficient documentation

## 2014-05-09 DIAGNOSIS — E039 Hypothyroidism, unspecified: Secondary | ICD-10-CM | POA: Insufficient documentation

## 2014-05-09 DIAGNOSIS — N390 Urinary tract infection, site not specified: Secondary | ICD-10-CM | POA: Diagnosis not present

## 2014-05-09 DIAGNOSIS — K219 Gastro-esophageal reflux disease without esophagitis: Secondary | ICD-10-CM | POA: Insufficient documentation

## 2014-05-09 DIAGNOSIS — E119 Type 2 diabetes mellitus without complications: Secondary | ICD-10-CM | POA: Diagnosis not present

## 2014-05-09 DIAGNOSIS — E785 Hyperlipidemia, unspecified: Secondary | ICD-10-CM | POA: Insufficient documentation

## 2014-05-09 DIAGNOSIS — F419 Anxiety disorder, unspecified: Secondary | ICD-10-CM | POA: Diagnosis not present

## 2014-05-09 DIAGNOSIS — I2510 Atherosclerotic heart disease of native coronary artery without angina pectoris: Secondary | ICD-10-CM | POA: Insufficient documentation

## 2014-05-09 DIAGNOSIS — Z9889 Other specified postprocedural states: Secondary | ICD-10-CM | POA: Insufficient documentation

## 2014-05-09 DIAGNOSIS — Z794 Long term (current) use of insulin: Secondary | ICD-10-CM | POA: Insufficient documentation

## 2014-05-09 DIAGNOSIS — I10 Essential (primary) hypertension: Secondary | ICD-10-CM | POA: Insufficient documentation

## 2014-05-09 DIAGNOSIS — Z79899 Other long term (current) drug therapy: Secondary | ICD-10-CM | POA: Diagnosis not present

## 2014-05-09 DIAGNOSIS — Z91040 Latex allergy status: Secondary | ICD-10-CM | POA: Diagnosis not present

## 2014-05-09 DIAGNOSIS — Z7982 Long term (current) use of aspirin: Secondary | ICD-10-CM | POA: Diagnosis not present

## 2014-05-09 DIAGNOSIS — Z923 Personal history of irradiation: Secondary | ICD-10-CM | POA: Insufficient documentation

## 2014-05-09 DIAGNOSIS — Z7902 Long term (current) use of antithrombotics/antiplatelets: Secondary | ICD-10-CM | POA: Diagnosis not present

## 2014-05-09 DIAGNOSIS — F329 Major depressive disorder, single episode, unspecified: Secondary | ICD-10-CM | POA: Diagnosis not present

## 2014-05-09 DIAGNOSIS — D649 Anemia, unspecified: Secondary | ICD-10-CM | POA: Insufficient documentation

## 2014-05-09 DIAGNOSIS — M1990 Unspecified osteoarthritis, unspecified site: Secondary | ICD-10-CM | POA: Insufficient documentation

## 2014-05-09 DIAGNOSIS — Z853 Personal history of malignant neoplasm of breast: Secondary | ICD-10-CM | POA: Diagnosis not present

## 2014-05-09 DIAGNOSIS — Z87891 Personal history of nicotine dependence: Secondary | ICD-10-CM | POA: Insufficient documentation

## 2014-05-09 DIAGNOSIS — R52 Pain, unspecified: Secondary | ICD-10-CM | POA: Diagnosis present

## 2014-05-09 LAB — CBC WITH DIFFERENTIAL/PLATELET
BASOS PCT: 0 % (ref 0–1)
Basophils Absolute: 0 10*3/uL (ref 0.0–0.1)
EOS ABS: 0.2 10*3/uL (ref 0.0–0.7)
EOS PCT: 3 % (ref 0–5)
HCT: 33 % — ABNORMAL LOW (ref 36.0–46.0)
Hemoglobin: 10.3 g/dL — ABNORMAL LOW (ref 12.0–15.0)
LYMPHS PCT: 24 % (ref 12–46)
Lymphs Abs: 1.6 10*3/uL (ref 0.7–4.0)
MCH: 28.7 pg (ref 26.0–34.0)
MCHC: 31.2 g/dL (ref 30.0–36.0)
MCV: 91.9 fL (ref 78.0–100.0)
MONOS PCT: 8 % (ref 3–12)
Monocytes Absolute: 0.6 10*3/uL (ref 0.1–1.0)
Neutro Abs: 4.4 10*3/uL (ref 1.7–7.7)
Neutrophils Relative %: 65 % (ref 43–77)
Platelets: 285 10*3/uL (ref 150–400)
RBC: 3.59 MIL/uL — ABNORMAL LOW (ref 3.87–5.11)
RDW: 16.7 % — ABNORMAL HIGH (ref 11.5–15.5)
WBC: 6.8 10*3/uL (ref 4.0–10.5)

## 2014-05-09 LAB — URINALYSIS, ROUTINE W REFLEX MICROSCOPIC
BILIRUBIN URINE: NEGATIVE
Glucose, UA: NEGATIVE mg/dL
Ketones, ur: NEGATIVE mg/dL
Nitrite: NEGATIVE
PH: 8 (ref 5.0–8.0)
Protein, ur: 100 mg/dL — AB
Specific Gravity, Urine: 1.012 (ref 1.005–1.030)
UROBILINOGEN UA: 1 mg/dL (ref 0.0–1.0)

## 2014-05-09 LAB — TYPE AND SCREEN
ABO/RH(D): O NEG
ANTIBODY SCREEN: NEGATIVE
Unit division: 0
Unit division: 0

## 2014-05-09 LAB — BASIC METABOLIC PANEL
Anion gap: 12 (ref 5–15)
BUN: 19 mg/dL (ref 6–23)
CHLORIDE: 100 mmol/L (ref 96–112)
CO2: 22 mmol/L (ref 19–32)
Calcium: 8.7 mg/dL (ref 8.4–10.5)
Creatinine, Ser: 0.92 mg/dL (ref 0.50–1.10)
GFR calc Af Amer: 71 mL/min — ABNORMAL LOW (ref >90)
GFR calc non Af Amer: 61 mL/min — ABNORMAL LOW (ref >90)
GLUCOSE: 159 mg/dL — AB (ref 70–99)
Potassium: 4.6 mmol/L (ref 3.5–5.1)
Sodium: 134 mmol/L — ABNORMAL LOW (ref 135–145)

## 2014-05-09 LAB — URINE MICROSCOPIC-ADD ON

## 2014-05-09 MED ORDER — ACETAMINOPHEN 325 MG PO TABS
650.0000 mg | ORAL_TABLET | Freq: Once | ORAL | Status: AC
  Administered 2014-05-09: 650 mg via ORAL
  Filled 2014-05-09: qty 2

## 2014-05-09 MED ORDER — CEPHALEXIN 500 MG PO CAPS: 500.0000 mg | ORAL_CAPSULE | Freq: Three times a day (TID) | ORAL | Status: DC

## 2014-05-09 MED ORDER — SODIUM CHLORIDE 0.9 % IV BOLUS (SEPSIS)
1000.0000 mL | Freq: Once | INTRAVENOUS | Status: AC
  Administered 2014-05-09: 1000 mL via INTRAVENOUS

## 2014-05-09 MED ORDER — DEXTROSE 5 % IV SOLN
1.0000 g | INTRAVENOUS | Status: DC
  Administered 2014-05-09: 1 g via INTRAVENOUS
  Filled 2014-05-09: qty 10

## 2014-05-09 NOTE — Discharge Instructions (Signed)

## 2014-05-09 NOTE — ED Notes (Signed)
Bed: HD62 Expected date:  Expected time:  Means of arrival:  Comments: Generalized body aches

## 2014-05-09 NOTE — ED Notes (Signed)
Pt reports generalized body aches with movement since 0030 this morning. Pt given 2 units of blood yesterday.

## 2014-05-09 NOTE — ED Provider Notes (Signed)
CSN: [id number]     Arrival date & time 05/09/14  7989 History   First MD Initiated Contact with Patient 05/09/14 [phone]     Chief Complaint  Patient presents with  . Generalized Body Aches     (Consider location/radiation/quality/duration/timing/severity/associated sxs/prior Treatment) Patient is a 74 y.o. female presenting with general illness.  Illness Quality:  Malaise, myalgias, arthralgias Severity:  Severe Onset quality:  Gradual Duration:  16 hours Timing:  Constant Progression:  Worsening Chronicity:  New Context:  Received 2 units of blood yesterday due to anemia thought to be cancer related.  Relieved by:  Nothing Worsened by:  Trying to get out of bed Ineffective treatments:  None tried Associated symptoms: myalgias and nausea   Associated symptoms: no abdominal pain, no chest pain, no diarrhea, no fever, no shortness of breath and no vomiting     Past Medical History  Diagnosis Date  . Anemia   . Depression   . Diabetes mellitus type II   . GERD (gastroesophageal reflux disease)   . Hyperlipidemia   . Hypothyroidism   . Osteoarthritis   . Carotid artery stenosis     bilateral  . CAD (coronary artery disease)     medical therapy, old tot RCA, patent LAD, mod. severe ostial LCX stenosis  . OSA (obstructive sleep apnea)   . Breast cancer     right breast cancer-invasive  ductal carcinoma (StageIV)  . Hypertension   . Anxiety   . Carotid artery occlusion   . Unspecified constipation 03/15/2012  . Bursitis   . Collagen vascular disease   . History of radiation therapy 10/31/12-11/13/12    rt&lt humerous 30Gy/80fx  . Valvular heart disease 07/11/2013  . Diarrhea 09/28/2013   Past Surgical History  Procedure Laterality Date  . Incisional breast biopsy      remoted left breast biopsy  . Cesarean section    . Other surgical history      GYN surgery  . Knee surgery      right knee surgery  . Carotid endarterectomy      right carotid  . Port-a-cath removal     . Modified mastectomy      Right breast  . Breast surgery    . Mastectomy    . Carotid stent      left  . Cardiac catheterization  06/2013    old total occ. of RCA, moderately severe ostial LCX stenosis- medical therapy- would be complex PCI  . Carotid angiogram N/A 03/01/2011    Procedure: CAROTID ANGIOGRAM;  Surgeon: Conrad [place], MD;  Location: [facility] CATH LAB;  Service: Cardiovascular;  Laterality: N/A;  . Arch aortogram  03/01/2011    Procedure: ARCH AORTOGRAM;  Surgeon: Conrad [place], MD;  Location: [facility] CATH LAB;  Service: Cardiovascular;;  . Carotid stent insertion Left 03/15/2011    Procedure: CAROTID STENT INSERTION;  Surgeon: Serafina Mitchell, MD;  Location: [facility] CATH LAB;  Service: Cardiovascular;  Laterality: Left;  . Left heart catheterization with coronary angiogram N/A 07/07/2013    Procedure: LEFT HEART CATHETERIZATION WITH CORONARY ANGIOGRAM;  Surgeon: Blane Ohara, MD;  Location: [facility] CATH LAB;  Service: Cardiovascular;  Laterality: N/A;   Family History  Problem Relation Age of Onset  . Arthritis    . Coronary artery disease      first degree relative  . Stroke      first degree relative  . Diabetes Mother   . Heart disease Mother   . Hypertension Mother   .  Cancer Mother   . Deep vein thrombosis Mother   . Colon cancer Neg Hx   . Stomach cancer Neg Hx   . Cancer Father     Pancreatic cancer  . Breast cancer Paternal Aunt 47   History  Substance Use Topics  . Smoking status: Former Smoker -- 1.00 packs/day for 20 years    Types: Cigarettes    Quit date: 01/31/1988  . Smokeless tobacco: Never Used  . Alcohol Use: No   OB History    No data available     Review of Systems  Constitutional: Negative for fever.  Respiratory: Negative for shortness of breath.   Cardiovascular: Negative for chest pain.  Gastrointestinal: Positive for nausea. Negative for vomiting, abdominal pain and diarrhea.  Musculoskeletal: Positive for myalgias.  All other systems reviewed  and are negative.     Allergies  Chlorhexidine; Adhesive; Codeine; and Latex  Home Medications   Prior to Admission medications   Medication Sig Start Date End Date Taking? Authorizing Provider  aspirin 81 MG chewable tablet Chew 81 mg by mouth daily.   Yes Historical Provider, MD  atorvastatin (LIPITOR) 80 MG tablet Take 1 tablet (80 mg total) by mouth daily. 03/23/14  Yes Peter M Martinique, MD  carvedilol (COREG) 25 MG tablet Take 1 tablet (25 mg total) by mouth 2 (two) times daily with a meal. 02/19/14  Yes Peter M Martinique, MD  clopidogrel (PLAVIX) 75 MG tablet Take 1 tablet (75 mg total) by mouth daily. 11/28/13  Yes Serafina Mitchell, MD  cyanocobalamin (,VITAMIN B-12,) 1000 MCG/ML injection Inject 1,000 mcg into the muscle every 30 (thirty) days.    Yes Historical Provider, MD  darbepoetin (ARANESP) 200 MCG/0.4ML SOLN Inject 200 mcg into the skin once. Every 30 days   Yes Historical Provider, MD  gabapentin (NEURONTIN) 100 MG capsule Take 1 capsule (100 mg total) by mouth 3 (three) times daily. 04/06/14  Yes Laurie Panda, NP  hydrochlorothiazide (MICROZIDE) 12.5 MG capsule Take 2 capsules (25 mg total) by mouth daily. 04/08/14  Yes Isaiah Serge, NP  insulin glargine (LANTUS) 100 UNIT/ML injection Inject 0.6 mLs (60 Units total) into the skin at bedtime. Pt wants vials.   Yes Mosie Lukes, MD  isosorbide mononitrate (IMDUR) 60 MG 24 hr tablet Take 2 tablets (120 mg total) by mouth daily. Patient taking differently: Take 60 mg by mouth 2 (two) times daily.  08/05/13  Yes Isaiah Serge, NP  levothyroxine (SYNTHROID, LEVOTHROID) 75 MCG tablet Take 1 tablet (75 mcg total) by mouth daily before breakfast. Patient taking differently: Take 75 mcg by mouth daily before breakfast. Pt takes 1 tablet one daily and two the next day alternating 03/19/14  Yes Mosie Lukes, MD  metFORMIN (GLUCOPHAGE) 500 MG tablet Take 1 tablet (500 mg total) by mouth every morning. 04/22/14  Yes Mosie Lukes, MD   morphine (MS CONTIN) 30 MG 12 hr tablet Take 1 tablet (30 mg total) by mouth 2 (two) times daily. 04/30/14  Yes Chauncey Cruel, MD  omeprazole (PRILOSEC) 40 MG capsule Take 1 capsule (40 mg total) by mouth daily. 03/02/14  Yes Mosie Lukes, MD  polyethylene glycol (MIRALAX / GLYCOLAX) packet Take 17 g by mouth daily as needed for mild constipation.    Yes Historical Provider, MD  senna-docusate (SENOKOT-S) 8.6-50 MG per tablet Take 1 tablet by mouth daily.   Yes Historical Provider, MD  Blood Glucose Monitoring Suppl (ACCU-CHEK NANO SMARTVIEW) W/DEVICE KIT  Use to check blood sugar twice a day. DX 250.00 03/28/13   Mosie Lukes, MD  glucose blood (ACCU-CHEK AVIVA) test strip Use as instructed to check blood sugar twice a day  Dx 250.00 03/28/13   Mosie Lukes, MD  glycerin adult (GLYCERIN ADULT) 2 G SUPP Place 1 suppository rectally once as needed (constipation). 04/13/12   Julianne Rice, MD  nitroGLYCERIN (NITROSTAT) 0.4 MG SL tablet Place 1 tablet (0.4 mg total) under the tongue every 5 (five) minutes as needed for chest pain. 07/08/13   Janece Canterbury, MD   BP 193/63 mmHg  Pulse 66  Temp(Src) 98 F (36.7 C) (Oral)  Resp 18  SpO2 98%  LMP 03/13/2012 Physical Exam  Constitutional: She is oriented to person, place, and time. She appears well-developed and well-nourished. No distress.  HENT:  Head: Normocephalic and atraumatic.  Mouth/Throat: Oropharynx is clear and moist.  Eyes: Conjunctivae are normal. Pupils are equal, round, and reactive to light. No scleral icterus.  Neck: Neck supple.  Cardiovascular: Normal rate, regular rhythm, normal heart sounds and intact distal pulses.   No murmur heard. Pulmonary/Chest: Effort normal and breath sounds normal. No stridor. No respiratory distress. She has no rales.  Abdominal: Soft. Bowel sounds are normal. She exhibits no distension. There is no tenderness.  Musculoskeletal: Normal range of motion.  Neurological: She is alert and oriented  to person, place, and time.  Skin: Skin is warm and dry. No rash noted.  Psychiatric: She has a normal mood and affect. Her behavior is normal.  Nursing note and vitals reviewed.   ED Course  Procedures (including critical care time) Labs Review Labs Reviewed  CBC WITH DIFFERENTIAL/PLATELET - Abnormal; Notable for the following:    RBC 3.59 (*)    Hemoglobin 10.3 (*)    HCT 33.0 (*)    RDW 16.7 (*)    All other components within normal limits  BASIC METABOLIC PANEL - Abnormal; Notable for the following:    Sodium 134 (*)    Glucose, Bld 159 (*)    GFR calc non Af Amer 61 (*)    GFR calc Af Amer 71 (*)    All other components within normal limits  URINALYSIS, ROUTINE W REFLEX MICROSCOPIC - Abnormal; Notable for the following:    APPearance TURBID (*)    Hgb urine dipstick SMALL (*)    Protein, ur 100 (*)    Leukocytes, UA LARGE (*)    All other components within normal limits  URINE MICROSCOPIC-ADD ON - Abnormal; Notable for the following:    Squamous Epithelial / LPF FEW (*)    All other components within normal limits  URINE CULTURE    Imaging Review No results found.   EKG Interpretation None      MDM   Final diagnoses:  UTI (lower urinary tract infection)    74 year old female with history of metastatic breast cancer who presents with generalized malaise and myalgias. This is in the setting of receiving 2 units of blood yesterday.  Labs showed good response to transfusion.  Also showed UTI, which likely explains her symptoms.  She felt better after tylenol.  Given IV ceftriaxone in ED.  She remained wellappearing, nontoxic, and not septic.  Plan outpatient treatment for UTI and follow up with PCP closely.  Return precautions given.    Serita Grit, MD 05/09/14 1550

## 2014-05-11 LAB — URINE CULTURE

## 2014-05-11 NOTE — Addendum Note (Signed)
Addended by: Oliver Hum on: 05/11/2014 08:21 AM   Modules accepted: Orders

## 2014-05-12 ENCOUNTER — Telehealth (HOSPITAL_BASED_OUTPATIENT_CLINIC_OR_DEPARTMENT_OTHER): Payer: Self-pay | Admitting: Emergency Medicine

## 2014-05-12 ENCOUNTER — Other Ambulatory Visit: Payer: Self-pay | Admitting: *Deleted

## 2014-05-12 ENCOUNTER — Encounter: Payer: Self-pay | Admitting: Family Medicine

## 2014-05-12 ENCOUNTER — Ambulatory Visit (INDEPENDENT_AMBULATORY_CARE_PROVIDER_SITE_OTHER): Payer: Medicare Other | Admitting: Family Medicine

## 2014-05-12 VITALS — BP 160/60 | HR 58 | Temp 98.4°F | Ht 63.0 in | Wt 188.0 lb

## 2014-05-12 DIAGNOSIS — D630 Anemia in neoplastic disease: Secondary | ICD-10-CM | POA: Diagnosis not present

## 2014-05-12 DIAGNOSIS — I10 Essential (primary) hypertension: Secondary | ICD-10-CM

## 2014-05-12 DIAGNOSIS — N3000 Acute cystitis without hematuria: Secondary | ICD-10-CM

## 2014-05-12 MED ORDER — NITROFURANTOIN MACROCRYSTAL 100 MG PO CAPS: ORAL_CAPSULE | ORAL | Status: DC

## 2014-05-12 MED ORDER — AMLODIPINE BESYLATE 10 MG PO TABS: 10.0000 mg | ORAL_TABLET | Freq: Every day | ORAL | Status: DC

## 2014-05-12 NOTE — Telephone Encounter (Signed)
Post ED Visit - Positive Culture Follow-up  Culture report reviewed by antimicrobial stewardship pharmacist: []  Wes [place], Pharm.D., BCPS [x]  Heide Guile, Pharm.D., BCPS []  Alycia Rossetti, Pharm.D., BCPS []  Pierson, Pharm.D., BCPS, AAHIVP []  Legrand Como, Pharm.D., BCPS, AAHIVP []  Isac Sarna, Pharm.D., BCPS  Positive urine culture E. coli Treated with cephalexin, organism sensitive to the same and no further patient follow-up is required at this time.  Hazle Nordmann 05/12/2014, 10:18 AM

## 2014-05-12 NOTE — Progress Notes (Signed)
Office Note 05/16/2014  CC:  Chief Complaint  Patient presents with  . Establish Care    HPI:  Brittney Cunningham is a 74 y.o. White female who is here to transfer care from Dr. Charlett Blake at [name] [place]--due to convenience of our office location to her home.  Old records in EPIC/HL EMR were reviewed prior to or during today's visit. Pt recent dx'd with UTI in ED dept, urine clx showed e coli that was pansensitive.  She got a shot of rocephin and was d/c'd home on keflex on 05/09/14. Looks like Dr. Griffith Citron wants her to change abx.  She has no UTI-specific sx's, just recent malaise in the setting of anemia and subsequent recent pRBC transfusion.  Pt states she doesn't really know why her Hb was low but gives hx of chronic mild anemia treated with aranesp, has had transfusion of 1 unit of pRBCs about a year ago.    BP's have been persistently 583E-940H systolic.  Diastolics normal. Recent increase of HCTZ from 12.5 to 25 has not helped. Pt has recent mild LE swelling x 6 wks and says "I usually only have this in the summer".   Past Medical History  Diagnosis Date  . Anemia   . Depression   . Diabetes mellitus type II   . GERD (gastroesophageal reflux disease)   . Hyperlipidemia   . Hypothyroidism   . Osteoarthritis   . Carotid artery stenosis     bilateral  . CAD (coronary artery disease)     medical therapy, old tot RCA, patent LAD, mod. severe ostial LCX stenosis  . OSA (obstructive sleep apnea)   . Breast cancer     right breast cancer-invasive  ductal carcinoma (StageIV)  . Hypertension   . Anxiety   . Carotid artery occlusion   . Unspecified constipation 03/15/2012  . Bursitis   . Collagen vascular disease   . History of radiation therapy 10/31/12-11/13/12    rt&lt humerous 30Gy/48fx  . Valvular heart disease 07/11/2013  . Diarrhea 09/28/2013  . Chronic renal insufficiency, stage II (mild)     Borderline stage II/III, CrCl 60s.  . History of blood transfusion   . Urinary  tract bacterial infections     Past Surgical History  Procedure Laterality Date  . Incisional breast biopsy      remoted left breast biopsy  . Cesarean section    . Other surgical history      GYN surgery  . Knee surgery      right knee surgery  . Carotid endarterectomy  2011    right carotid  . Port-a-cath removal    . Modified mastectomy      Right breast  . Breast surgery    . Mastectomy    . Carotid stent      left  . Cardiac catheterization  06/2013    old total occ. of RCA, moderately severe ostial LCX stenosis- medical therapy- would be complex PCI  . Carotid angiogram N/A 03/01/2011    Procedure: CAROTID ANGIOGRAM;  Surgeon: Conrad [place], MD;  Location: [facility] CATH LAB;  Service: Cardiovascular;  Laterality: N/A;  . Arch aortogram  03/01/2011    Procedure: ARCH AORTOGRAM;  Surgeon: Conrad Scanlon, MD;  Location: [facility] CATH LAB;  Service: Cardiovascular;;  . Carotid stent insertion Left 03/15/2011    Procedure: CAROTID STENT INSERTION;  Surgeon: Serafina Mitchell, MD;  Location: [facility] CATH LAB;  Service: Cardiovascular;  Laterality: Left;  . Left heart catheterization with  coronary angiogram N/A 07/07/2013    Procedure: LEFT HEART CATHETERIZATION WITH CORONARY ANGIOGRAM;  Surgeon: Blane Ohara, MD;  Location: [facility] CATH LAB;  Service: Cardiovascular;  Laterality: N/A;  . Carotid dopplers  05/2014    Patent ICAs, bilat ECA dz: no change compared to 2014    Family History  Problem Relation Age of Onset  . Arthritis    . Coronary artery disease      first degree relative  . Stroke      first degree relative  . Diabetes Mother   . Heart disease Mother   . Hypertension Mother   . Cancer Mother   . Deep vein thrombosis Mother   . Colon cancer Neg Hx   . Stomach cancer Neg Hx   . Cancer Father     Pancreatic cancer  . Breast cancer Paternal Aunt 57    History   Social History  . Marital Status: Married    Spouse Name: N/A  . Number of Children: 3  . Years of Education: N/A    Occupational History  . 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 Smoking status: Former Smoker -- 1.00 packs/day for 20 years    Types: Cigarettes    Quit date: 01/31/1988  . Smokeless tobacco: Never Used  . Alcohol Use: No  . Drug Use: No  . Sexual Activity: Not Currently    Birth Control/ Protection: Post-menopausal   Other Topics Concern  . Not on file   Social History Narrative   Retired-ran a daycare facility for 40 years.   Married- 68 years    2 sons    1 daughter - Marine scientist midwife   Former smoker-quit in [place].     Outpatient Encounter Prescriptions as of 05/12/2014  Medication Sig  . aspirin 81 MG chewable tablet Chew 81 mg by mouth daily.  Marland Kitchen atorvastatin (LIPITOR) 80 MG tablet Take 1 tablet (80 mg total) by mouth daily.  . Blood Glucose Monitoring Suppl (ACCU-CHEK NANO SMARTVIEW) W/DEVICE KIT Use to check blood sugar twice a day. DX 250.00  . carvedilol (COREG) 25 MG tablet Take 1 tablet (25 mg total) by mouth 2 (two) times daily with a meal.  . cephALEXin (KEFLEX) 500 MG capsule Take 1 capsule (500 mg total) by mouth 3 (three) times daily.  . clopidogrel (PLAVIX) 75 MG tablet Take 1 tablet (75 mg total) by mouth daily.  . cyanocobalamin (,VITAMIN B-12,) 1000 MCG/ML injection Inject 1,000 mcg into the muscle every 30 (thirty) days.   . darbepoetin (ARANESP) 200 MCG/0.4ML SOLN Inject 200 mcg into the skin once. Every 30 days  . gabapentin (NEURONTIN) 100 MG capsule Take 1 capsule (100 mg total) by mouth 3 (three) times daily.  Marland Kitchen glucose blood (ACCU-CHEK AVIVA) test strip Use as instructed to check blood sugar twice a day  Dx 250.00  . glycerin adult (GLYCERIN ADULT) 2 G SUPP Place 1 suppository rectally once as needed (constipation).  . hydrochlorothiazide (MICROZIDE) 12.5 MG capsule Take 2 capsules (25 mg total) by mouth daily.  . insulin glargine (LANTUS) 100 UNIT/ML injection Inject 0.6 mLs (60 Units total) into the skin at bedtime. Pt wants  vials.  . isosorbide mononitrate (IMDUR) 60 MG 24 hr tablet Take 2 tablets (120 mg total) by mouth daily. (Patient taking differently: Take 60 mg by mouth 2 (two) times daily. )  . levothyroxine (SYNTHROID, LEVOTHROID) 75 MCG tablet Take 1 tablet (75 mcg total)  by mouth daily before breakfast. (Patient taking differently: Take 75 mcg by mouth daily before breakfast. Pt takes 1 tablet one daily and two the next day alternating)  . metFORMIN (GLUCOPHAGE) 500 MG tablet Take 1 tablet (500 mg total) by mouth every morning.  Marland Kitchen morphine (MS CONTIN) 30 MG 12 hr tablet Take 1 tablet (30 mg total) by mouth 2 (two) times daily.  . nitroGLYCERIN (NITROSTAT) 0.4 MG SL tablet Place 1 tablet (0.4 mg total) under the tongue every 5 (five) minutes as needed for chest pain.  Marland Kitchen omeprazole (PRILOSEC) 40 MG capsule Take 1 capsule (40 mg total) by mouth daily.  . polyethylene glycol (MIRALAX / GLYCOLAX) packet Take 17 g by mouth daily as needed for mild constipation.   . senna-docusate (SENOKOT-S) 8.6-50 MG per tablet Take 1 tablet by mouth daily.  Marland Kitchen amLODipine (NORVASC) 10 MG tablet Take 1 tablet (10 mg total) by mouth daily.    Allergies  Allergen Reactions  . Chlorhexidine Hives, Itching and Rash    This was most likely a CONTACT DERMATITIS versus true systemic allergic reaction  . Adhesive [Tape] Hives  . Codeine Swelling  . Latex     ROS Review of Systems  Constitutional: Positive for fatigue (mild). Negative for fever.  HENT: Negative for congestion and sore throat.   Eyes: Negative for visual disturbance.  Respiratory: Negative for cough.   Cardiovascular: Negative for chest pain.  Gastrointestinal: Negative for nausea and abdominal pain.  Genitourinary: Negative for dysuria, frequency and hematuria.  Musculoskeletal: Negative for back pain and joint swelling.  Skin: Negative for rash.  Neurological: Negative for weakness and headaches.  Hematological: Negative for adenopathy.    PE; Blood  pressure 160/60, pulse 58, temperature 98.4 F (36.9 C), temperature source Oral, height 5\' 3"  (1.6 m), weight 188 lb (85.276 kg), last menstrual period 03/13/2012, SpO2 97 %. Gen: Alert, well appearing.  Patient is oriented to person, place, time, and situation. 05/11/2012: no injection, icteris, swelling, or exudate.  EOMI, PERRLA. Mouth: lips without lesion/swelling.  Oral mucosa pink and moist. Oropharynx without erythema, exudate, or swelling.  CV: RRR, no m/r/g.   LUNGS: CTA bilat, nonlabored resps, good aeration in all lung fields. EXT: trace bilat pitting edema in ankles + some "doughy" feel to LL's diffusely.   No stasis derm rash or skin breakdown.  Skin is not tense.  Pertinent labs:  Lab Results  Component Value Date   WBC 6.8 05/09/2014   HGB 10.3* 05/09/2014   HCT 33.0* 05/09/2014   MCV 91.9 05/09/2014   PLT 285 05/09/2014  *This is post-transfusion CBC    Chemistry      Component Value Date/Time   NA 134* 05/09/2014 1048   NA 136 05/04/2014 0926   K 4.6 05/09/2014 1048   K 4.7 05/04/2014 0926   CL 100 05/09/2014 1048   CL 104 07/08/2012 1023   CO2 22 05/09/2014 1048   CO2 21* 05/04/2014 0926   BUN 19 05/09/2014 1048   BUN 24.5 05/04/2014 0926   CREATININE 0.92 05/09/2014 1048   CREATININE 1.2* 05/04/2014 0926   CREATININE 1.20* 09/26/2013 1331      Component Value Date/Time   CALCIUM 8.7 05/09/2014 1048   CALCIUM 8.6 05/04/2014 0926   ALKPHOS 166* 05/04/2014 0926   ALKPHOS 120* 03/02/2014 1158   AST 22 05/04/2014 0926   AST 19 03/02/2014 1158   ALT 13 05/04/2014 0926   ALT 9 03/02/2014 1158   BILITOT 0.33 05/04/2014 0926  BILITOT 0.4 03/02/2014 1158      ASSESSMENT AND PLAN:   Transfer patient:  1) Uncontrolled hypertension: add amlodipine $RemoveBefore'10mg'[id number]$  qd today and continue all other antihypertensives. Continue home bp monitoring. Review numbers with pt at recheck o/v in 2 wks.  2) UTI: continue abx (keflex being changed to a new antibiotic of Dr.  Magrinot's choice at this moment). Pt w/out any symptoms of UTI.  3) Anemia in neoplastic disease. Iron has been low in the past but no testing of this since 2014.  Vit B12 and folate testing in 2013 were normal. Recent pRBC transfusion x 2 units raised Hb appropriately. This is one of the many chronic issues the patient has that I'll need to do more chart research to make sure I'm not missing anything (see PMH--"collagen vasc disease", "valvular heart disease", "OSA").  An After Visit Summary was printed and given to the patient.  Return in about 2 weeks (around 05/26/2014) for 30 min appt in 2 wks; f/u HTN and cont "establish care".

## 2014-05-12 NOTE — Progress Notes (Signed)
Pre visit review using our clinic review tool, if applicable. No additional management support is needed unless otherwise documented below in the visit note. 

## 2014-05-18 ENCOUNTER — Other Ambulatory Visit: Payer: Self-pay | Admitting: Family Medicine

## 2014-05-18 DIAGNOSIS — E039 Hypothyroidism, unspecified: Secondary | ICD-10-CM

## 2014-05-18 NOTE — Telephone Encounter (Signed)
Looks like this patient transferred to Dr Anitra Lauth please clarify with patient the dose she is currently taking and then send the refill request to his nurse so they can approve. Thx

## 2014-05-18 NOTE — Telephone Encounter (Signed)
Advise on refill as there as multiple instructions.  Advise please

## 2014-05-19 ENCOUNTER — Other Ambulatory Visit: Payer: Self-pay | Admitting: Family Medicine

## 2014-05-19 NOTE — Telephone Encounter (Signed)
Patient informed and she has transferred to Dr. Anitra Lauth due to financial issues and location at this time.  Pharmacy informed to notify Dr. Isla Pence office.

## 2014-05-22 ENCOUNTER — Encounter: Payer: Self-pay | Admitting: Family Medicine

## 2014-05-22 ENCOUNTER — Ambulatory Visit (INDEPENDENT_AMBULATORY_CARE_PROVIDER_SITE_OTHER): Payer: Medicare Other | Admitting: Family Medicine

## 2014-05-22 VITALS — BP 132/71 | HR 60 | Temp 100.2°F | Resp 18 | Ht 63.0 in | Wt 197.0 lb

## 2014-05-22 DIAGNOSIS — M545 Low back pain: Secondary | ICD-10-CM

## 2014-05-22 DIAGNOSIS — C50919 Malignant neoplasm of unspecified site of unspecified female breast: Secondary | ICD-10-CM

## 2014-05-22 DIAGNOSIS — R5381 Other malaise: Secondary | ICD-10-CM

## 2014-05-22 DIAGNOSIS — R509 Fever, unspecified: Secondary | ICD-10-CM

## 2014-05-22 DIAGNOSIS — C7951 Secondary malignant neoplasm of bone: Secondary | ICD-10-CM

## 2014-05-22 DIAGNOSIS — Z87440 Personal history of urinary (tract) infections: Secondary | ICD-10-CM

## 2014-05-22 DIAGNOSIS — M25562 Pain in left knee: Secondary | ICD-10-CM

## 2014-05-22 DIAGNOSIS — I10 Essential (primary) hypertension: Secondary | ICD-10-CM

## 2014-05-22 LAB — COMPREHENSIVE METABOLIC PANEL
ALT: 10 U/L (ref 0–35)
AST: 21 U/L (ref 0–37)
Albumin: 3.5 g/dL (ref 3.5–5.2)
Alkaline Phosphatase: 213 U/L — ABNORMAL HIGH (ref 39–117)
BUN: 41 mg/dL — ABNORMAL HIGH (ref 6–23)
CO2: 27 mEq/L (ref 19–32)
CREATININE: 1.18 mg/dL (ref 0.40–1.20)
Calcium: 8.6 mg/dL (ref 8.4–10.5)
Chloride: 98 mEq/L (ref 96–112)
GFR: 47.89 mL/min — AB (ref >60.00)
Glucose, Bld: 227 mg/dL — ABNORMAL HIGH (ref 70–99)
Potassium: 5 mEq/L (ref 3.5–5.1)
Sodium: 132 mEq/L — ABNORMAL LOW (ref 135–145)
Total Bilirubin: 0.6 mg/dL (ref 0.2–1.2)
Total Protein: 6.6 g/dL (ref 6.0–8.3)

## 2014-05-22 LAB — CBC WITH DIFFERENTIAL/PLATELET
BASOS ABS: 0 10*3/uL (ref 0.0–0.1)
Basophils Relative: 0.8 % (ref 0.0–3.0)
EOS PCT: 2.2 % (ref 0.0–5.0)
Eosinophils Absolute: 0.1 10*3/uL (ref 0.0–0.7)
HEMATOCRIT: 30.1 % — AB (ref 36.0–46.0)
Hemoglobin: 9.8 g/dL — ABNORMAL LOW (ref 12.0–15.0)
Lymphocytes Relative: 24.8 % (ref 12.0–46.0)
Lymphs Abs: 1.4 10*3/uL (ref 0.7–4.0)
MCHC: 32.5 g/dL (ref 30.0–36.0)
MCV: 89 fl (ref 78.0–100.0)
Monocytes Absolute: 0.6 10*3/uL (ref 0.1–1.0)
Monocytes Relative: 10.8 % (ref 3.0–12.0)
Neutro Abs: 3.6 10*3/uL (ref 1.4–7.7)
Neutrophils Relative %: 61.4 % (ref 43.0–77.0)
PLATELETS: 211 10*3/uL (ref 150.0–400.0)
RBC: 3.38 Mil/uL — AB (ref 3.87–5.11)
RDW: 17.1 % — ABNORMAL HIGH (ref 11.5–15.5)
WBC: 5.8 10*3/uL (ref 4.0–10.5)

## 2014-05-22 LAB — POCT URINALYSIS DIPSTICK
Bilirubin, UA: NEGATIVE
Blood, UA: NEGATIVE
GLUCOSE UA: NEGATIVE
Ketones, UA: NEGATIVE
Leukocytes, UA: NEGATIVE
NITRITE UA: NEGATIVE
Protein, UA: NEGATIVE
Spec Grav, UA: 1.01
UROBILINOGEN UA: 0.2
pH, UA: 5.5

## 2014-05-22 LAB — C-REACTIVE PROTEIN: CRP: 4.4 mg/dL (ref 0.5–20.0)

## 2014-05-22 LAB — SEDIMENTATION RATE: Sed Rate: 65 mm/hr — ABNORMAL HIGH (ref 0–22)

## 2014-05-22 NOTE — Progress Notes (Signed)
OFFICE VISIT  05/22/2014   CC:  Chief Complaint  Patient presents with  . Knee Pain    fever in knees  . Back Pain  . Flank Pain    left sided.    HPI:    Patient is a 74 y.o. Caucasian female who presents for feeling increased pain in both knee joints + feeling of warmth in knees last night and today. LB pain worse.  Admits to doing a lot of work in home yesterday, cleaning out closet, was up and down a lot. She does have hx of widespread bone mets in appendicular and axillary skeleton on PET scan 09/2012 (metastatic breast ca) but in talking to her today it sounds like this is not typically a source of much pain. She took motrin yesterday for knee pains during the day (400 mg twice yesterday).   Tolerating amlodipine that I recently started her on fine.  No home bp measurements to report (has wrist cuff but is not using)    Starting having stuffy nose last night but no ST or cough or HA or facial pain. Nauseated this morning, which is not so unusual for her.  No vomiting.  She has not felt a fever but feels generalized malaise. No abd pain or diarrhea.  Urine feels warm occ when it comes out, no dysuria.   Past Medical History  Diagnosis Date  . Anemia     anemia of renal insuff and neoplastic dz  . Depression   . Diabetes mellitus type II   . GERD (gastroesophageal reflux disease)   . Hyperlipidemia   . Hypothyroidism   . Osteoarthritis   . Carotid artery stenosis     bilateral  . CAD (coronary artery disease)     medical therapy, old tot RCA, patent LAD, mod. severe ostial LCX stenosis  . OSA (obstructive sleep apnea)   . Breast cancer     right breast cancer-invasive  ductal carcinoma (StageIV)  . Hypertension   . Anxiety   . Carotid artery occlusion   . Unspecified constipation 03/15/2012  . Bursitis   . Collagen vascular disease   . History of radiation therapy 10/31/12-11/13/12    rt&lt humerous 30Gy/92fx  . Valvular heart disease 07/11/2013  . Diarrhea  09/28/2013  . Chronic renal insufficiency, stage II (mild)     Borderline stage II/III, CrCl 60s.  . History of blood transfusion   . Urinary tract bacterial infections     Past Surgical History  Procedure Laterality Date  . Incisional breast biopsy      remoted left breast biopsy  . Cesarean section    . Other surgical history      GYN surgery  . Knee surgery      right knee surgery  . Carotid endarterectomy  2011    right carotid  . Port-a-cath removal    . Modified mastectomy      Right breast  . Breast surgery    . Mastectomy    . Carotid stent      left  . Cardiac catheterization  06/2013    old total occ. of RCA, moderately severe ostial LCX stenosis- medical therapy- would be complex PCI  . Carotid angiogram N/A 03/01/2011    Procedure: CAROTID ANGIOGRAM;  Surgeon: Conrad [place], MD;  Location: [facility] CATH LAB;  Service: Cardiovascular;  Laterality: N/A;  . Arch aortogram  03/01/2011    Procedure: ARCH AORTOGRAM;  Surgeon: Conrad Embarrass, MD;  Location: [facility] CATH LAB;  Service: Cardiovascular;;  . Carotid stent insertion Left 03/15/2011    Procedure: CAROTID STENT INSERTION;  Surgeon: Serafina Mitchell, MD;  Location: [facility] CATH LAB;  Service: Cardiovascular;  Laterality: Left;  . Left heart catheterization with coronary angiogram N/A 07/07/2013    Procedure: LEFT HEART CATHETERIZATION WITH CORONARY ANGIOGRAM;  Surgeon: Blane Ohara, MD;  Location: [facility] CATH LAB;  Service: Cardiovascular;  Laterality: N/A;  . Carotid dopplers  05/2014    Patent ICAs, bilat ECA dz: no change compared to 2014   MEDS: not taking macrodantin or keflex listed below. Outpatient Prescriptions Prior to Visit  Medication Sig Dispense Refill  . amLODipine (NORVASC) 10 MG tablet Take 1 tablet (10 mg total) by mouth daily. 30 tablet 0  . aspirin 81 MG chewable tablet Chew 81 mg by mouth daily.    Marland Kitchen atorvastatin (LIPITOR) 80 MG tablet Take 1 tablet (80 mg total) by mouth daily. 30 tablet 1  . carvedilol (COREG) 25  MG tablet Take 1 tablet (25 mg total) by mouth 2 (two) times daily with a meal. 60 tablet 6  . clopidogrel (PLAVIX) 75 MG tablet Take 1 tablet (75 mg total) by mouth daily. 30 tablet 8  . cyanocobalamin (,VITAMIN B-12,) 1000 MCG/ML injection Inject 1,000 mcg into the muscle every 30 (thirty) days.     . darbepoetin (ARANESP) 200 MCG/0.4ML SOLN Inject 200 mcg into the skin once. Every 30 days    . gabapentin (NEURONTIN) 100 MG capsule Take 1 capsule (100 mg total) by mouth 3 (three) times daily. 90 capsule 2  . glycerin adult (GLYCERIN ADULT) 2 G SUPP Place 1 suppository rectally once as needed (constipation). 10 suppository 0  . hydrochlorothiazide (MICROZIDE) 12.5 MG capsule Take 2 capsules (25 mg total) by mouth daily. 60 capsule 5  . insulin glargine (LANTUS) 100 UNIT/ML injection Inject 0.6 mLs (60 Units total) into the skin at bedtime. Pt wants vials. 30 mL 1  . isosorbide mononitrate (IMDUR) 60 MG 24 hr tablet Take 2 tablets (120 mg total) by mouth daily. (Patient taking differently: Take 60 mg by mouth 2 (two) times daily. ) 60 tablet 11  . levothyroxine (SYNTHROID, LEVOTHROID) 75 MCG tablet TAKE ONE TABLET BY MOUTH EVERY DAY BEFORE BREAKFAST 30 tablet 3  . metFORMIN (GLUCOPHAGE) 500 MG tablet Take 1 tablet (500 mg total) by mouth every morning. 30 tablet 5  . morphine (MS CONTIN) 30 MG 12 hr tablet Take 1 tablet (30 mg total) by mouth 2 (two) times daily. 60 tablet 0  . nitroGLYCERIN (NITROSTAT) 0.4 MG SL tablet Place 1 tablet (0.4 mg total) under the tongue every 5 (five) minutes as needed for chest pain. 30 tablet 0  . omeprazole (PRILOSEC) 40 MG capsule Take 1 capsule (40 mg total) by mouth daily. 30 capsule 3  . polyethylene glycol (MIRALAX / GLYCOLAX) packet Take 17 g by mouth daily as needed for mild constipation.     . senna-docusate (SENOKOT-S) 8.6-50 MG per tablet Take 1 tablet by mouth daily.    . Blood Glucose Monitoring Suppl (ACCU-CHEK NANO SMARTVIEW) W/DEVICE KIT Use to check  blood sugar twice a day. DX 250.00 1 kit 0  . glucose blood (ACCU-CHEK AVIVA) test strip Use as instructed to check blood sugar twice a day  Dx 250.00 100 each 3  . nitrofurantoin (MACRODANTIN) 100 MG capsule 1 tablet po bid x 3 days then 1 tab a day x 4 (Patient not taking: Reported on 05/22/2014) 10 capsule 0  .  cephALEXin (KEFLEX) 500 MG capsule Take 1 capsule (500 mg total) by mouth 3 (three) times daily. 21 capsule 0   No facility-administered medications prior to visit.    Allergies  Allergen Reactions  . Chlorhexidine Hives, Itching and Rash    This was most likely a CONTACT DERMATITIS versus true systemic allergic reaction  . Adhesive [Tape] Hives  . Codeine Swelling  . Latex     ROS As per HPI  PE: Blood pressure 132/71, pulse 60, temperature 100.2 F (37.9 C), temperature source Temporal, resp. rate 18, height $RemoveBe'5\' 3"'ghBGNVBhw$  (1.6 m), weight 197 lb (89.359 kg), last menstrual period 03/13/2012, SpO2 97 %. Gen: Alert, tired appearing but nontoxic.  Patient is oriented to person, place, time, and situation. AFFECT: pleasant, lucid thought and speech. CBJ:SEGB: no injection, icteris, swelling, or exudate.  EOMI, PERRLA. Mouth: lips without lesion/swelling.  Oral mucosa pink and moist. Oropharynx without erythema, exudate, or swelling.  CV: RRR, no m/r/g Chest is clear, no wheezing or rales. Normal symmetric air entry throughout both lung fields. No chest wall deformities or tenderness. ABD: soft, NT/ND EXT: 1-2 + pitting in both LL's, with moderate warmth of both knees and proximal LL's diffusely, without erythema or tenderness.  Mild pain with ROM of L knee at full extension and full flexion.  No knee effusion noted. Low back: no TTP SKIN: no rash or jaundice.   LABS:   CC UA today was normal.    Chemistry      Component Value Date/Time   NA 134* 05/09/2014 1048   NA 136 05/04/2014 0926   K 4.6 05/09/2014 1048   K 4.7 05/04/2014 0926   CL 100 05/09/2014 1048   CL 104  07/08/2012 1023   CO2 22 05/09/2014 1048   CO2 21* 05/04/2014 0926   BUN 19 05/09/2014 1048   BUN 24.5 05/04/2014 0926   CREATININE 0.92 05/09/2014 1048   CREATININE 1.2* 05/04/2014 0926   CREATININE 1.20* 09/26/2013 1331      Component Value Date/Time   CALCIUM 8.7 05/09/2014 1048   CALCIUM 8.6 05/04/2014 0926   ALKPHOS 166* 05/04/2014 0926   ALKPHOS 120* 03/02/2014 1158   AST 22 05/04/2014 0926   AST 19 03/02/2014 1158   ALT 13 05/04/2014 0926   ALT 9 03/02/2014 1158   BILITOT 0.33 05/04/2014 0926   BILITOT 0.4 03/02/2014 1158     Lab Results  Component Value Date   WBC 6.8 05/09/2014   HGB 10.3* 05/09/2014   HCT 33.0* 05/09/2014   MCV 91.9 05/09/2014   PLT 285 05/09/2014    IMPRESSION AND PLAN:  1) Musculoskeletal pain: primarily left knee and low back, with mild malaise. Low grade fever + stuffy nose also onset in last day or so. She looks rather well, has normal urine.  No sign of any joint inflammation on exam today. She may possibly be early in the process of a viral syndrome. Will check CBC w/diff and CMET today, with ESR and CRP. Watchful waiting approach at this time, no new meds started. Very doubtful that this is any effect from the amlodipine she recently started. She will be seeing her oncology provider in 3d when she says she'll get her next cancer treatment.  An After Visit Summary was printed and given to the patient.  FOLLOW UP: Return in about 1 week (around 05/29/2014) for 30 min f/u bone/joint pain, malaise.

## 2014-05-22 NOTE — Progress Notes (Signed)
Pre visit review using our clinic review tool, if applicable. No additional management support is needed unless otherwise documented below in the visit note. 

## 2014-05-25 ENCOUNTER — Ambulatory Visit (HOSPITAL_BASED_OUTPATIENT_CLINIC_OR_DEPARTMENT_OTHER): Payer: Medicare Other | Admitting: Nurse Practitioner

## 2014-05-25 ENCOUNTER — Encounter: Payer: Self-pay | Admitting: Nurse Practitioner

## 2014-05-25 ENCOUNTER — Telehealth: Payer: Self-pay | Admitting: Oncology

## 2014-05-25 ENCOUNTER — Other Ambulatory Visit (HOSPITAL_BASED_OUTPATIENT_CLINIC_OR_DEPARTMENT_OTHER): Payer: Medicare Other

## 2014-05-25 VITALS — BP 128/62 | HR 63 | Temp 98.3°F | Resp 18 | Ht 63.0 in | Wt 197.1 lb

## 2014-05-25 DIAGNOSIS — C50919 Malignant neoplasm of unspecified site of unspecified female breast: Secondary | ICD-10-CM

## 2014-05-25 DIAGNOSIS — C50911 Malignant neoplasm of unspecified site of right female breast: Secondary | ICD-10-CM

## 2014-05-25 DIAGNOSIS — C7951 Secondary malignant neoplasm of bone: Principal | ICD-10-CM

## 2014-05-25 DIAGNOSIS — D631 Anemia in chronic kidney disease: Secondary | ICD-10-CM

## 2014-05-25 DIAGNOSIS — N189 Chronic kidney disease, unspecified: Secondary | ICD-10-CM

## 2014-05-25 DIAGNOSIS — Z170 Estrogen receptor positive status [ER+]: Secondary | ICD-10-CM

## 2014-05-25 LAB — COMPREHENSIVE METABOLIC PANEL (CC13)
ALK PHOS: 184 U/L — AB (ref 40–150)
ALT: 13 U/L (ref 0–55)
ANION GAP: 14 meq/L — AB (ref 3–11)
AST: 18 U/L (ref 5–34)
Albumin: 2.8 g/dL — ABNORMAL LOW (ref 3.5–5.0)
BUN: 29.5 mg/dL — AB (ref 7.0–26.0)
CHLORIDE: 102 meq/L (ref 98–109)
CO2: 20 mEq/L — ABNORMAL LOW (ref 22–29)
Calcium: 8.7 mg/dL (ref 8.4–10.4)
Creatinine: 1.2 mg/dL — ABNORMAL HIGH (ref 0.6–1.1)
EGFR: 44 mL/min/{1.73_m2} — AB (ref >90)
Glucose: 298 mg/dl — ABNORMAL HIGH (ref 70–140)
Potassium: 4.6 mEq/L (ref 3.5–5.1)
Sodium: 136 mEq/L (ref 136–145)
Total Bilirubin: 0.54 mg/dL (ref 0.20–1.20)
Total Protein: 6.5 g/dL (ref 6.4–8.3)

## 2014-05-25 LAB — CBC WITH DIFFERENTIAL/PLATELET
BASO%: 0.4 % (ref 0.0–2.0)
Basophils Absolute: 0 10*3/uL (ref 0.0–0.1)
EOS ABS: 0.2 10*3/uL (ref 0.0–0.5)
EOS%: 4.4 % (ref 0.0–7.0)
HCT: 29.6 % — ABNORMAL LOW (ref 34.8–46.6)
HEMOGLOBIN: 9.3 g/dL — AB (ref 11.6–15.9)
LYMPH#: 1.3 10*3/uL (ref 0.9–3.3)
LYMPH%: 27.4 % (ref 14.0–49.7)
MCH: 29.1 pg (ref 25.1–34.0)
MCHC: 31.4 g/dL — ABNORMAL LOW (ref 31.5–36.0)
MCV: 92.5 fL (ref 79.5–101.0)
MONO#: 0.4 10*3/uL (ref 0.1–0.9)
MONO%: 8.3 % (ref 0.0–14.0)
NEUT#: 2.9 10*3/uL (ref 1.5–6.5)
NEUT%: 59.5 % (ref 38.4–76.8)
PLATELETS: 198 10*3/uL (ref 145–400)
RBC: 3.2 10*6/uL — ABNORMAL LOW (ref 3.70–5.45)
RDW: 16.3 % — ABNORMAL HIGH (ref 11.2–14.5)
WBC: 4.8 10*3/uL (ref 3.9–10.3)

## 2014-05-25 NOTE — Progress Notes (Signed)
Patient ID: Brittney Cunningham, female   DOB: 1940-06-19, 74 y.o.   MRN: [id number]  Brittney Cunningham  [phone] [phone]) [phone]  OFFICE PROGRESS NOTE   ID: AMBRA HAVERSTICK   DOB: 10/25/1940  MR#: [id number]  FXT#:[id number]   PCP: [name], MD SU: Rolm Bookbinder OTHER MD: Teena Dunk, Kyung Rudd, Kevin Supple, Peter Martinique  CHIEF COMPLAINT:  Metastatic Breast Cancer CURRENT TREATMENT: fulvestrant, aranesp, B-12  HISTORY OF PRESENT ILLNESS: Carrington was diagnosed with right breast carcinoma in May of 2011, a biopsy showing a grade 2 invasive ductal carcinoma, T2 NXM1, stage IV at presentation. There was bone only involvement. Tumor was strongly ER and PR positive, HER-2/neu negative, with an MIP-1 of 26%.  She was treated neoadjuvantly with letrozole and zoledronic acid beginning in June of 2011. She is status post right modified radical mastectomy in February 2012 for a ypT2 ypN2, grade 1 invasive ductal carcinoma. Margins were negative.   Her subsequent history is as detailed below   INTERVAL HISTORY: Cyani returns today for follow up of her metastatic breast cancer as well as her B12 deficiency and anemia of renal failure. She continues on vitamin B, fulvestrant, and aranesp monthly. She tolerates these treatments well with no complaints. She missed her restaging scans because of an anxiety attack her husband suffered from. He did not want to be left alone for more than a few minutes.   REVIEW OF SYSTEMS: Brittney Cunningham has visited her PCP recently who has been making adjustments to her blood pressure medicines. Since this time, she has noticed increased swelling to her bilateral ankles. On the day of her visit with Dr. Ernestine Conrad she had a low grade fever and sinus symptoms, but this has resolved. She continues on miralax and stool softeners for chronic constipation. She continues on MS Contin for her shoulder pain, secondary to metastasis. She has decreased range of  motion to both arms now. She does not think the gabapentin has helped with the burning pain she sometimes experiences to her upper neck and arm. A detailed review of systems is otherwise.   PAST MEDICAL HISTORY: Past Medical History  Diagnosis Date  . Anemia     anemia of renal insuff and neoplastic dz  . Depression   . Diabetes mellitus type II   . GERD (gastroesophageal reflux disease)   . Hyperlipidemia   . Hypothyroidism   . Osteoarthritis   . Carotid artery stenosis     bilateral  . CAD (coronary artery disease)     medical therapy, old tot RCA, patent LAD, mod. severe ostial LCX stenosis  . OSA (obstructive sleep apnea)   . Breast cancer     right breast cancer-invasive  ductal carcinoma (StageIV)  . Hypertension   . Anxiety   . Carotid artery occlusion   . Unspecified constipation 03/15/2012  . Bursitis   . Collagen vascular disease   . History of radiation therapy 10/31/12-11/13/12    rt&lt humerous 30Gy/8fx  . Valvular heart disease 07/11/2013  . Diarrhea 09/28/2013  . Chronic renal insufficiency, stage II (mild)     Borderline stage II/III, CrCl 60s.  . History of blood transfusion   . Urinary tract bacterial infections     PAST SURGICAL HISTORY: Past Surgical History  Procedure Laterality Date  . Incisional breast biopsy      remoted left breast biopsy  . Cesarean section    . Other surgical history      GYN surgery  . Knee  surgery      right knee surgery  . Carotid endarterectomy  2011    right carotid  . Port-a-cath removal    . Modified mastectomy      Right breast  . Breast surgery    . Mastectomy    . Carotid stent      left  . Cardiac catheterization  06/2013    old total occ. of RCA, moderately severe ostial LCX stenosis- medical therapy- would be complex PCI  . Carotid angiogram N/A 03/01/2011    Procedure: CAROTID ANGIOGRAM;  Surgeon: Conrad Chical, MD;  Location[facility] CATH LAB;  Service: Cardiovascular;  Laterality: N/A;  . Arch aortogram   03/01/2011    Procedure: ARCH AORTOGRAM;  Surgeon: Conrad [place], MD;  Location: [facility] CATH LAB;  Service: Cardiovascular;;  . Carotid stent insertion Left 03/15/2011    Procedure: CAROTID STENT INSERTION;  Surgeon: Serafina Mitchell, MD;  Location: [facility] CATH LAB;  Service: Cardiovascular;  Laterality: Left;  . Left heart catheterization with coronary angiogram N/A 07/07/2013    Procedure: LEFT HEART CATHETERIZATION WITH CORONARY ANGIOGRAM;  Surgeon: Blane Ohara, MD;  Location: [facility] CATH LAB;  Service: Cardiovascular;  Laterality: N/A;  . Carotid dopplers  05/2014    Patent ICAs, bilat ECA dz: no change compared to 2014    FAMILY HISTORY Family History  Problem Relation Age of Onset  . Arthritis    . Coronary artery disease      first degree relative  . Stroke      first degree relative  . Diabetes Mother   . Heart disease Mother   . Hypertension Mother   . Cancer Mother   . Deep vein thrombosis Mother   . Colon cancer Neg Hx   . Stomach cancer Neg Hx   . Cancer Father     Pancreatic cancer  . Breast cancer Paternal Aunt 59  The patient's father died from pancreatic cancer at the age of 56. The patient's mother died from complications of diabetes and heart disease at the age of 52. The patient has 2 sisters and 2 brothers. There is no history of breast cancer or ovarian cancer in the immediate family. One of the patient's paternal aunts out of 6 paternal aunts had breast cancer diagnosed in her 41s.    GYNECOLOGIC HISTORY: The patient is GX, P3. First pregnancy to term age 63. She went through the change of life in her late 26s. She never took hormones.   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 Her husband, [name], is disabled secondary to a fall.  He has difficulty with walking. He used to work for [employer] previously. The patient's son, [name], lives in [place], and works for [employer]. Daughter, [name], lives in  [place], and works for [employer]. Daughter, [name], lives in [place], [place], and works as a Geologist, engineering. The patient attends [employer].  ADVANCED DIRECTIVES: Not on file  HEALTH MAINTENANCE:  (Updated 04/23/2013) History  Substance Use Topics  . Smoking status: Former Smoker -- 1.00 packs/day for 20 years    Types: Cigarettes    Quit date: 01/31/1988  . Smokeless tobacco: Never Used  . Alcohol Use: No    Colonoscopy: Not on file  PAP: Not on file  Bone density: July 2011, Normal  Lipid panel: 06/26/2012  Allergies  Allergen Reactions  . Chlorhexidine Hives, Itching and Rash    This was  most likely a CONTACT DERMATITIS versus true systemic allergic reaction  . Adhesive [Tape] Hives  . Codeine Swelling  . Latex     Current Outpatient Prescriptions  Medication Sig Dispense Refill  . amLODipine (NORVASC) 10 MG tablet Take 1 tablet (10 mg total) by mouth daily. 30 tablet 0  . aspirin 81 MG chewable tablet Chew 81 mg by mouth daily.    Marland Kitchen atorvastatin (LIPITOR) 80 MG tablet Take 1 tablet (80 mg total) by mouth daily. 30 tablet 1  . Blood Glucose Monitoring Suppl (ACCU-CHEK NANO SMARTVIEW) W/DEVICE KIT Use to check blood sugar twice a day. DX 250.00 1 kit 0  . carvedilol (COREG) 25 MG tablet Take 1 tablet (25 mg total) by mouth 2 (two) times daily with a meal. 60 tablet 6  . clopidogrel (PLAVIX) 75 MG tablet Take 1 tablet (75 mg total) by mouth daily. 30 tablet 8  . cyanocobalamin (,VITAMIN B-12,) 1000 MCG/ML injection Inject 1,000 mcg into the muscle every 30 (thirty) days.     . darbepoetin (ARANESP) 200 MCG/0.4ML SOLN Inject 200 mcg into the skin once. Every 30 days    . gabapentin (NEURONTIN) 100 MG capsule Take 1 capsule (100 mg total) by mouth 3 (three) times daily. 90 capsule 2  . glucose blood (ACCU-CHEK AVIVA) test strip Use as instructed to check blood sugar twice a day  Dx 250.00 100 each 3  . hydrochlorothiazide (MICROZIDE) 12.5 MG capsule Take  2 capsules (25 mg total) by mouth daily. 60 capsule 5  . insulin glargine (LANTUS) 100 UNIT/ML injection Inject 0.6 mLs (60 Units total) into the skin at bedtime. Pt wants vials. 30 mL 1  . isosorbide mononitrate (IMDUR) 60 MG 24 hr tablet Take 2 tablets (120 mg total) by mouth daily. (Patient taking differently: Take 60 mg by mouth 2 (two) times daily. ) 60 tablet 11  . levothyroxine (SYNTHROID, LEVOTHROID) 75 MCG tablet TAKE ONE TABLET BY MOUTH EVERY DAY BEFORE BREAKFAST 30 tablet 3  . metFORMIN (GLUCOPHAGE) 500 MG tablet Take 1 tablet (500 mg total) by mouth every morning. 30 tablet 5  . morphine (MS CONTIN) 30 MG 12 hr tablet Take 1 tablet (30 mg total) by mouth 2 (two) times daily. 60 tablet 0  . omeprazole (PRILOSEC) 40 MG capsule Take 1 capsule (40 mg total) by mouth daily. 30 capsule 3  . senna-docusate (SENOKOT-S) 8.6-50 MG per tablet Take 1 tablet by mouth daily.    Marland Kitchen glycerin adult (GLYCERIN ADULT) 2 G SUPP Place 1 suppository rectally once as needed (constipation). (Patient not taking: Reported on 05/25/2014) 10 suppository 0  . nitroGLYCERIN (NITROSTAT) 0.4 MG SL tablet Place 1 tablet (0.4 mg total) under the tongue every 5 (five) minutes as needed for chest pain. (Patient not taking: Reported on 05/25/2014) 30 tablet 0  . polyethylene glycol (MIRALAX / GLYCOLAX) packet Take 17 g by mouth daily as needed for mild constipation.     . [DISCONTINUED] simvastatin (ZOCOR) 40 MG tablet Take 40 mg by mouth every evening.     No current facility-administered medications for this visit.    OBJECTIVE: Elderly white woman  Who appears stated age 64 Vitals:   05/25/14 1033  BP: 128/62  Pulse: 63  Temp: 98.3 F (36.8 C)  Resp: 18     Body mass index is 34.92 kg/(m^2).    ECOG FS: 1 Filed Weights   05/25/14 1033  Weight: 197 lb 1 oz (89.387 kg)   Skin: warm, dry  HEENT:  sclerae anicteric, conjunctivae pink, oropharynx clear. No thrush or mucositis.  Lymph Nodes: No cervical or  supraclavicular lymphadenopathy  Lungs: clear to auscultation bilaterally, no rales, wheezes, or rhonci  Heart: regular rate and rhythm  Abdomen: round, soft, non tender, positive bowel sounds  Musculoskeletal: No focal spinal tenderness, + 2  Edema to bilateral lower extremities R>L, poor range of motion to arms, unable to lift above breast level Neuro: non focal, well oriented, positive affect  Breasts: deferred  LAB RESULTS: Lab Results  Component Value Date   WBC 4.8 05/25/2014   NEUTROABS 2.9 05/25/2014   HGB 9.3* 05/25/2014   HCT 29.6* 05/25/2014   MCV 92.5 05/25/2014   PLT 198 05/25/2014      Chemistry      Component Value Date/Time   NA 136 05/25/2014 1020   NA 132* 05/22/2014 1104   K 4.6 05/25/2014 1020   K 5.0 05/22/2014 1104   CL 98 05/22/2014 1104   CL 104 07/08/2012 1023   CO2 20* 05/25/2014 1020   CO2 27 05/22/2014 1104   BUN 29.5* 05/25/2014 1020   BUN 41* 05/22/2014 1104   CREATININE 1.2* 05/25/2014 1020   CREATININE 1.18 05/22/2014 1104   CREATININE 1.20* 09/26/2013 1331      Component Value Date/Time   CALCIUM 8.7 05/25/2014 1020   CALCIUM 8.6 05/22/2014 1104   ALKPHOS 184* 05/25/2014 1020   ALKPHOS 213* 05/22/2014 1104   AST 18 05/25/2014 1020   AST 21 05/22/2014 1104   ALT 13 05/25/2014 1020   ALT 10 05/22/2014 1104   BILITOT 0.54 05/25/2014 1020   BILITOT 0.6 05/22/2014 1104      STUDIES: No results found.  ASSESSMENT: 74 y.o. [place] woman:   (1) Status post right breast biopsy in 05/2009 for a grade 2 invasive ductal carcinoma, T2 NX M1, Stage IV, with bone-only involvement, strongly estrogen and progesterone receptor-positive, HER-2-negative with an MIB-1 of 26%.   (2) Neoadjuvantly she received Letrozole and zoledronic acid beginning in June of 2011 and underwent right modified radical mastectomy February of 2012 for a ypT2 ypN2, grade 1 invasive ductal carcinoma with negative margins.   (3) She completed radiation therapy in  August of 2012.   (4) She has continued on Letrozole but was switched from Zoledronic acid to Denosumab Delton See) because of concerns regarding her serum creatinine. Delton See was being given every 8 weeks.  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  (6) symptomatic anemia, with creatinine clearance < 60 cc/min; Darbepoietin Q14d started 11/03/2011; received Feraheme 01/05/2012  (7) On subcutaneous B-12 supplementation monthly.  (8) Pain in left upper extremity with osseous metastasis, osteoarthritis, tendinopathy, and bursitis as confirmed by recent MRI.  (9) Anemia, multifactorial with renal disease, on Aranesp monthly  (10)  letrozole was discontinued in September 2014 with evidence of disease progression. She was started on fulvestrant injections, first given on 10/17/2012.   PLAN: Brittney Cunningham is doing well overall today. The labs were reviewed in detail and were entirely stable. She will proceed with aranesp and fulvestrant injections next week as planned.   I asked that she address her leg swelling with her PCP Dr. Anitra Lauth as he has been the one making changes to her blood pressure medicines. I have sent a referral to physical therapy to improve the range of motion to her upper arms.   Satori will have a repeat bone scan this month, and she will set up her mammogram for next month. Her  next office visit will be in 4 months. She understands and agrees with this plan. She knows the goal of treatment in her case is control. She has been encouraged to call with any issues that might arise before her next visit here.  Laurie Panda, NP  05/25/2014  11:24 AM

## 2014-05-25 NOTE — Telephone Encounter (Signed)
per pof ot sch pt appt-gave pt copy of sch °

## 2014-05-27 ENCOUNTER — Encounter: Payer: Self-pay | Admitting: Family Medicine

## 2014-05-27 ENCOUNTER — Ambulatory Visit (INDEPENDENT_AMBULATORY_CARE_PROVIDER_SITE_OTHER): Payer: Medicare Other | Admitting: Family Medicine

## 2014-05-27 VITALS — BP 136/67 | HR 59 | Temp 99.3°F | Resp 16 | Ht 63.0 in | Wt 197.0 lb

## 2014-05-27 DIAGNOSIS — C50919 Malignant neoplasm of unspecified site of unspecified female breast: Secondary | ICD-10-CM

## 2014-05-27 DIAGNOSIS — E118 Type 2 diabetes mellitus with unspecified complications: Secondary | ICD-10-CM | POA: Diagnosis not present

## 2014-05-27 DIAGNOSIS — C7951 Secondary malignant neoplasm of bone: Secondary | ICD-10-CM

## 2014-05-27 DIAGNOSIS — R5381 Other malaise: Secondary | ICD-10-CM | POA: Diagnosis not present

## 2014-05-27 DIAGNOSIS — D630 Anemia in neoplastic disease: Secondary | ICD-10-CM | POA: Diagnosis not present

## 2014-05-27 MED ORDER — INSULIN GLARGINE 100 UNIT/ML ~~LOC~~ SOLN: 70.0000 [IU] | Freq: Every day | SUBCUTANEOUS | Status: DC

## 2014-05-27 NOTE — Progress Notes (Signed)
Pre visit review using our clinic review tool, if applicable. No additional management support is needed unless otherwise documented below in the visit note. 

## 2014-05-27 NOTE — Patient Instructions (Signed)
Increase nightly lantus dose to 70 Units every night.

## 2014-05-27 NOTE — Progress Notes (Signed)
OFFICE NOTE  05/27/2014  CC:  Chief Complaint  Patient presents with  . Follow-up     HPI: Patient is a 74 y.o. Caucasian female who is here for 5 day f/u malaise and increased knee and LB pain. Still feeling about the same.  Repeat CBC showed slight drop in Hb (9.8 to 9.3 from 4/22 - 4/25). She has hemoccult cards but has not been able to complete them yet. Plan is for bone scan on 06/05/14 (hem-onc).  Fasting glucoses 180s. Cr too high to remain on metformin safely.  Pertinent PMH:  Past medical, surgical, social, and family history reviewed and no changes are noted since last office visit.  MEDS:  Outpatient Prescriptions Prior to Visit  Medication Sig Dispense Refill  . amLODipine (NORVASC) 10 MG tablet Take 1 tablet (10 mg total) by mouth daily. 30 tablet 0  . aspirin 81 MG chewable tablet Chew 81 mg by mouth daily.    Marland Kitchen atorvastatin (LIPITOR) 80 MG tablet Take 1 tablet (80 mg total) by mouth daily. 30 tablet 1  . carvedilol (COREG) 25 MG tablet Take 1 tablet (25 mg total) by mouth 2 (two) times daily with a meal. 60 tablet 6  . clopidogrel (PLAVIX) 75 MG tablet Take 1 tablet (75 mg total) by mouth daily. 30 tablet 8  . cyanocobalamin (,VITAMIN B-12,) 1000 MCG/ML injection Inject 1,000 mcg into the muscle every 30 (thirty) days.     . darbepoetin (ARANESP) 200 MCG/0.4ML SOLN Inject 200 mcg into the skin once. Every 30 days    . gabapentin (NEURONTIN) 100 MG capsule Take 1 capsule (100 mg total) by mouth 3 (three) times daily. 90 capsule 2  . glucose blood (ACCU-CHEK AVIVA) test strip Use as instructed to check blood sugar twice a day  Dx 250.00 100 each 3  . hydrochlorothiazide (MICROZIDE) 12.5 MG capsule Take 2 capsules (25 mg total) by mouth daily. 60 capsule 5  . insulin glargine (LANTUS) 100 UNIT/ML injection Inject 0.6 mLs (60 Units total) into the skin at bedtime. Pt wants vials. 30 mL 1  . isosorbide mononitrate (IMDUR) 60 MG 24 hr tablet Take 2 tablets (120 mg total) by  mouth daily. (Patient taking differently: Take 60 mg by mouth 2 (two) times daily. ) 60 tablet 11  . levothyroxine (SYNTHROID, LEVOTHROID) 75 MCG tablet TAKE ONE TABLET BY MOUTH EVERY DAY BEFORE BREAKFAST 30 tablet 3  . metFORMIN (GLUCOPHAGE) 500 MG tablet Take 1 tablet (500 mg total) by mouth every morning. 30 tablet 5  . morphine (MS CONTIN) 30 MG 12 hr tablet Take 1 tablet (30 mg total) by mouth 2 (two) times daily. 60 tablet 0  . nitroGLYCERIN (NITROSTAT) 0.4 MG SL tablet Place 1 tablet (0.4 mg total) under the tongue every 5 (five) minutes as needed for chest pain. 30 tablet 0  . omeprazole (PRILOSEC) 40 MG capsule Take 1 capsule (40 mg total) by mouth daily. 30 capsule 3  . polyethylene glycol (MIRALAX / GLYCOLAX) packet Take 17 g by mouth daily as needed for mild constipation.     . senna-docusate (SENOKOT-S) 8.6-50 MG per tablet Take 1 tablet by mouth daily.    . Blood Glucose Monitoring Suppl (ACCU-CHEK NANO SMARTVIEW) W/DEVICE KIT Use to check blood sugar twice a day. DX 250.00 1 kit 0  . glycerin adult (GLYCERIN ADULT) 2 G SUPP Place 1 suppository rectally once as needed (constipation). (Patient not taking: Reported on 05/25/2014) 10 suppository 0   No facility-administered medications prior to  visit.    PE: Blood pressure 136/67, pulse 59, temperature 99.3 F (37.4 C), temperature source Temporal, resp. rate 16, height $RemoveBe'5\' 3"'hvzqLVHwM$  (1.6 m), weight 197 lb (89.359 kg), last menstrual period 03/13/2012, SpO2 96 %. Gen: Alert, well appearing.  Patient is oriented to person, place, time, and situation. AFFECT: pleasant, lucid thought and speech. No further exam today.  LAB:  Lab Results  Component Value Date   HGBA1C 7.5* 03/02/2014    Lab Results  Component Value Date   WBC 4.8 05/25/2014   HGB 9.3* 05/25/2014   HCT 29.6* 05/25/2014   MCV 92.5 05/25/2014   PLT 198 05/25/2014     Chemistry      Component Value Date/Time   NA 136 05/25/2014 1020   NA 132* 05/22/2014 1104   K 4.6  05/25/2014 1020   K 5.0 05/22/2014 1104   CL 98 05/22/2014 1104   CL 104 07/08/2012 1023   CO2 20* 05/25/2014 1020   CO2 27 05/22/2014 1104   BUN 29.5* 05/25/2014 1020   BUN 41* 05/22/2014 1104   CREATININE 1.2* 05/25/2014 1020   CREATININE 1.18 05/22/2014 1104   CREATININE 1.20* 09/26/2013 1331      Component Value Date/Time   CALCIUM 8.7 05/25/2014 1020   CALCIUM 8.6 05/22/2014 1104   ALKPHOS 184* 05/25/2014 1020   ALKPHOS 213* 05/22/2014 1104   AST 18 05/25/2014 1020   AST 21 05/22/2014 1104   ALT 13 05/25/2014 1020   ALT 10 05/22/2014 1104   BILITOT 0.54 05/25/2014 1020   BILITOT 0.6 05/22/2014 1104       IMPRESSION AND PLAN:  1) Generalized malaise and diffuse bone pain/musculoskeletal pain:  Has breast ca with bone mets, plans for re-eval of bone mets 06/05/14 per hem/onc. Hyperglycemia could be contributing to the way she feels as well.  2) Anemia: anemia of chronic dz/malignancy + ? Contribution from CRI.   However, Hb dropping and need to r/o occult bleeding in GI tract. Hemoccult cards pending--pt to complete these and turn them in ASAP. Repeat CBC 1 week.  3) DM 2, Cr too high/GFR too low to be on metformin:  d/c metformin. Increase lantus to 70 U qhs. Recheck HbA1c 1 week-lab visit only.  An After Visit Summary was printed and given to the patient.  Lab visit for A1c and CBC in 1 wk. F/u office 2 wks.

## 2014-05-28 ENCOUNTER — Telehealth: Payer: Self-pay | Admitting: Family Medicine

## 2014-06-01 ENCOUNTER — Ambulatory Visit: Payer: Self-pay | Admitting: Family Medicine

## 2014-06-01 ENCOUNTER — Encounter: Payer: Self-pay | Admitting: Family Medicine

## 2014-06-01 ENCOUNTER — Ambulatory Visit (INDEPENDENT_AMBULATORY_CARE_PROVIDER_SITE_OTHER): Payer: Medicare Other | Admitting: Family Medicine

## 2014-06-01 ENCOUNTER — Ambulatory Visit: Payer: Self-pay

## 2014-06-01 ENCOUNTER — Other Ambulatory Visit: Payer: Self-pay

## 2014-06-01 VITALS — BP 154/72 | HR 68 | Temp 100.0°F | Resp 16 | Wt 197.0 lb

## 2014-06-01 DIAGNOSIS — D508 Other iron deficiency anemias: Secondary | ICD-10-CM

## 2014-06-01 DIAGNOSIS — E118 Type 2 diabetes mellitus with unspecified complications: Secondary | ICD-10-CM | POA: Diagnosis not present

## 2014-06-01 DIAGNOSIS — R609 Edema, unspecified: Secondary | ICD-10-CM

## 2014-06-01 MED ORDER — PIOGLITAZONE HCL 15 MG PO TABS: 15.0000 mg | ORAL_TABLET | Freq: Every day | ORAL | Status: DC

## 2014-06-01 NOTE — Progress Notes (Signed)
OFFICE NOTE  06/01/2014  CC:  Chief Complaint  Patient presents with  . Follow-up    She states that her blood sugar dropped after she increased her insulin to 70 units and she had to call EMS.     HPI: Patient is a 74 y.o. Caucasian female who is here for recent problems with some low glucoses. Last visit 5 d/a I d/c'd her metformin and increased her lantus to 70 U qhs (from 60).  First fasting gluc after change was 90s and she felt fine.  Next night she decreased dose to 65 and the following morning's glucose was 60s and she felt fatigued/lightheaded.  Then later in day recently she felt significant hypoglycemic rxn and gluc was 40s, EMS was called and they got her glucose up with PO intake-no glucagon had to be given.  Gluc came up to 154 after a few hours. She has not taken any lantus since then.  She admits today that the sugars she reported to me last visit were ON METFORMIN ONLY, had not taken any lantus in 3 mo due to financial probs--she had not told me this last visit.  No lantus last night, glucose 90.  No sx's of hypoglyc.  One hour later (nothing eaten, just drank water) it was 230.   She admits most of her highs when not taking the lantus (just on metformin) were in daytime/afternoon.  She does c/o some bilat LE swelling chronically.  Admits she doesn't eat low Na diet very well.  Also says she was rx'd compression hose in the past but doesn't wear them.  No pain/discomfort from the edema.    .pmh Lab Results  Component Value Date   HGBA1C 7.5* 03/02/2014    Pertinent PMH:  Past medical, surgical, social, and family history reviewed and no changes are noted since last office visit. No hx of heart failure. MEDS:  Outpatient Prescriptions Prior to Visit  Medication Sig Dispense Refill  . amLODipine (NORVASC) 10 MG tablet Take 1 tablet (10 mg total) by mouth daily. 30 tablet 0  . aspirin 81 MG chewable tablet Chew 81 mg by mouth daily.    Marland Kitchen atorvastatin (LIPITOR) 80 MG  tablet Take 1 tablet (80 mg total) by mouth daily. 30 tablet 1  . Blood Glucose Monitoring Suppl (ACCU-CHEK NANO SMARTVIEW) W/DEVICE KIT Use to check blood sugar twice a day. DX 250.00 1 kit 0  . carvedilol (COREG) 25 MG tablet Take 1 tablet (25 mg total) by mouth 2 (two) times daily with a meal. 60 tablet 6  . clopidogrel (PLAVIX) 75 MG tablet Take 1 tablet (75 mg total) by mouth daily. 30 tablet 8  . cyanocobalamin (,VITAMIN B-12,) 1000 MCG/ML injection Inject 1,000 mcg into the muscle every 30 (thirty) days.     . darbepoetin (ARANESP) 200 MCG/0.4ML SOLN Inject 200 mcg into the skin once. Every 30 days    . gabapentin (NEURONTIN) 100 MG capsule Take 1 capsule (100 mg total) by mouth 3 (three) times daily. 90 capsule 2  . glucose blood (ACCU-CHEK AVIVA) test strip Use as instructed to check blood sugar twice a day  Dx 250.00 100 each 3  . glycerin adult (GLYCERIN ADULT) 2 G SUPP Place 1 suppository rectally once as needed (constipation). 10 suppository 0  . hydrochlorothiazide (MICROZIDE) 12.5 MG capsule Take 2 capsules (25 mg total) by mouth daily. 60 capsule 5  . insulin glargine (LANTUS) 100 UNIT/ML injection Inject 0.7 mLs (70 Units total) into the skin at  bedtime. Pt wants vials. 30 mL 1  . isosorbide mononitrate (IMDUR) 60 MG 24 hr tablet Take 2 tablets (120 mg total) by mouth daily. (Patient taking differently: Take 60 mg by mouth 2 (two) times daily. ) 60 tablet 11  . levothyroxine (SYNTHROID, LEVOTHROID) 75 MCG tablet TAKE ONE TABLET BY MOUTH EVERY DAY BEFORE BREAKFAST 30 tablet 3  . morphine (MS CONTIN) 30 MG 12 hr tablet Take 1 tablet (30 mg total) by mouth 2 (two) times daily. 60 tablet 0  . nitroGLYCERIN (NITROSTAT) 0.4 MG SL tablet Place 1 tablet (0.4 mg total) under the tongue every 5 (five) minutes as needed for chest pain. 30 tablet 0  . omeprazole (PRILOSEC) 40 MG capsule Take 1 capsule (40 mg total) by mouth daily. 30 capsule 3  . polyethylene glycol (MIRALAX / GLYCOLAX) packet  Take 17 g by mouth daily as needed for mild constipation.     . senna-docusate (SENOKOT-S) 8.6-50 MG per tablet Take 1 tablet by mouth daily.     No facility-administered medications prior to visit.    PE: Blood pressure 154/72, pulse 68, temperature 100 F (37.8 C), temperature source Temporal, resp. rate 16, weight 197 lb (89.359 kg), last menstrual period 03/13/2012, SpO2 98 %. Gen: Alert, well appearing.  Patient is oriented to person, place, time, and situation. AFFECT: pleasant, lucid thought and speech. EXT: 1-2+ pitting edema in both LL's from knees into feet.  No erythema or hyperpigmentation changes.  Just a few varicose veins present.   IMPRESSION AND PLAN:  1) DM 2, not ideal control. Hx of noncompliance with insulin, incomplete disclosure of true med regimen--subsequently when I made changes to better control glucoses I inadvertently caused some hypoglycemia.   Stop lantus.  Has to stay off metformin due to GFR too low. Start actos 39m po qd.  Check glucose fasting and once later in day-2H PP.  2) Chroni LE venous insufficiency edema: discussed low Na diet, recommended she wear the compression hose she has been rx'd in the past.    3) Hx of anemia requiring pRBC transfusion: following hb and this has been ok. Gave pt hemoccults x 3 today to do at home.    An After Visit Summary was printed and given to the patient.  FOLLOW UP: Keep appt already scheduled for 06/10/14, and we'll repeat CBC and HbA1c at that time.

## 2014-06-01 NOTE — Progress Notes (Signed)
Pre visit review using our clinic review tool, if applicable. No additional management support is needed unless otherwise documented below in the visit note. 

## 2014-06-02 ENCOUNTER — Telehealth: Payer: Self-pay | Admitting: Oncology

## 2014-06-02 ENCOUNTER — Telehealth: Payer: Self-pay | Admitting: Physical Therapy

## 2014-06-02 NOTE — Telephone Encounter (Signed)
Returned patient call re r/s lab/inj that she cxd yesterday. Left message for patient to call office to let us know when she could come in.

## 2014-06-02 NOTE — Telephone Encounter (Signed)
Returnred voicemail. Confirmed appointment for 05/04.

## 2014-06-02 NOTE — Telephone Encounter (Signed)
Called pt 3x left messages,none were returned

## 2014-06-03 ENCOUNTER — Other Ambulatory Visit: Payer: Medicare Other

## 2014-06-03 ENCOUNTER — Other Ambulatory Visit: Payer: Self-pay | Admitting: Family Medicine

## 2014-06-03 ENCOUNTER — Other Ambulatory Visit (HOSPITAL_BASED_OUTPATIENT_CLINIC_OR_DEPARTMENT_OTHER): Payer: Medicare Other

## 2014-06-03 ENCOUNTER — Ambulatory Visit (HOSPITAL_BASED_OUTPATIENT_CLINIC_OR_DEPARTMENT_OTHER): Payer: Medicare Other

## 2014-06-03 VITALS — BP 142/61 | HR 64 | Temp 98.5°F | Resp 16

## 2014-06-03 DIAGNOSIS — C50911 Malignant neoplasm of unspecified site of right female breast: Secondary | ICD-10-CM | POA: Diagnosis not present

## 2014-06-03 DIAGNOSIS — D509 Iron deficiency anemia, unspecified: Secondary | ICD-10-CM

## 2014-06-03 DIAGNOSIS — N189 Chronic kidney disease, unspecified: Secondary | ICD-10-CM

## 2014-06-03 DIAGNOSIS — C7951 Secondary malignant neoplasm of bone: Principal | ICD-10-CM

## 2014-06-03 DIAGNOSIS — E538 Deficiency of other specified B group vitamins: Secondary | ICD-10-CM | POA: Diagnosis not present

## 2014-06-03 DIAGNOSIS — C50919 Malignant neoplasm of unspecified site of unspecified female breast: Secondary | ICD-10-CM

## 2014-06-03 DIAGNOSIS — Z5111 Encounter for antineoplastic chemotherapy: Secondary | ICD-10-CM | POA: Diagnosis not present

## 2014-06-03 DIAGNOSIS — E118 Type 2 diabetes mellitus with unspecified complications: Secondary | ICD-10-CM

## 2014-06-03 DIAGNOSIS — D630 Anemia in neoplastic disease: Secondary | ICD-10-CM

## 2014-06-03 DIAGNOSIS — D631 Anemia in chronic kidney disease: Secondary | ICD-10-CM

## 2014-06-03 DIAGNOSIS — K219 Gastro-esophageal reflux disease without esophagitis: Secondary | ICD-10-CM

## 2014-06-03 DIAGNOSIS — R5381 Other malaise: Secondary | ICD-10-CM

## 2014-06-03 LAB — CBC WITH DIFFERENTIAL/PLATELET
BASO%: 0.8 % (ref 0.0–2.0)
Basophils Absolute: 0 10*3/uL (ref 0.0–0.1)
EOS ABS: 0.2 10*3/uL (ref 0.0–0.5)
EOS%: 3.5 % (ref 0.0–7.0)
HCT: 27 % — ABNORMAL LOW (ref 34.8–46.6)
HGB: 8.7 g/dL — ABNORMAL LOW (ref 11.6–15.9)
LYMPH#: 1.5 10*3/uL (ref 0.9–3.3)
LYMPH%: 25.8 % (ref 14.0–49.7)
MCH: 29.1 pg (ref 25.1–34.0)
MCHC: 32.3 g/dL (ref 31.5–36.0)
MCV: 90.1 fL (ref 79.5–101.0)
MONO#: 0.6 10*3/uL (ref 0.1–0.9)
MONO%: 9.6 % (ref 0.0–14.0)
NEUT#: 3.6 10*3/uL (ref 1.5–6.5)
NEUT%: 60.3 % (ref 38.4–76.8)
PLATELETS: 222 10*3/uL (ref 145–400)
RBC: 3 10*6/uL — ABNORMAL LOW (ref 3.70–5.45)
RDW: 16.9 % — ABNORMAL HIGH (ref 11.2–14.5)
WBC: 5.9 10*3/uL (ref 3.9–10.3)

## 2014-06-03 LAB — COMPREHENSIVE METABOLIC PANEL (CC13)
ALT: 13 U/L (ref 0–55)
AST: 21 U/L (ref 5–34)
Albumin: 3 g/dL — ABNORMAL LOW (ref 3.5–5.0)
Alkaline Phosphatase: 162 U/L — ABNORMAL HIGH (ref 40–150)
Anion Gap: 12 mEq/L — ABNORMAL HIGH (ref 3–11)
BUN: 28.4 mg/dL — AB (ref 7.0–26.0)
CALCIUM: 8.9 mg/dL (ref 8.4–10.4)
CHLORIDE: 104 meq/L (ref 98–109)
CO2: 22 mEq/L (ref 22–29)
Creatinine: 1.2 mg/dL — ABNORMAL HIGH (ref 0.6–1.1)
EGFR: 48 mL/min/{1.73_m2} — ABNORMAL LOW (ref >90)
Glucose: 157 mg/dl — ABNORMAL HIGH (ref 70–140)
Potassium: 4.4 mEq/L (ref 3.5–5.1)
Sodium: 138 mEq/L (ref 136–145)
TOTAL PROTEIN: 6.8 g/dL (ref 6.4–8.3)
Total Bilirubin: 0.57 mg/dL (ref 0.20–1.20)

## 2014-06-03 MED ORDER — CYANOCOBALAMIN 1000 MCG/ML IJ SOLN
1000.0000 ug | Freq: Once | INTRAMUSCULAR | Status: AC
  Administered 2014-06-03: 1000 ug via INTRAMUSCULAR

## 2014-06-03 MED ORDER — FULVESTRANT 250 MG/5ML IM SOLN
500.0000 mg | Freq: Once | INTRAMUSCULAR | Status: AC
  Administered 2014-06-03: 500 mg via INTRAMUSCULAR
  Filled 2014-06-03: qty 10

## 2014-06-03 MED ORDER — DARBEPOETIN ALFA 200 MCG/0.4ML IJ SOSY
200.0000 ug | PREFILLED_SYRINGE | Freq: Once | INTRAMUSCULAR | Status: AC
  Administered 2014-06-03: 200 ug via SUBCUTANEOUS
  Filled 2014-06-03: qty 0.4

## 2014-06-03 NOTE — Patient Instructions (Addendum)
Cyanocobalamin, Vitamin B12 injection What is this medicine? CYANOCOBALAMIN (sye an oh koe BAL a min) is a man made form of vitamin B12. Vitamin B12 is used in the growth of healthy blood cells, nerve cells, and proteins in the body. It also helps with the metabolism of fats and carbohydrates. This medicine is used to treat people who can not absorb vitamin B12. This medicine may be used for other purposes; ask your health care provider or pharmacist if you have questions. COMMON BRAND NAME(S): Cyomin, LA-12, Nutri-Twelve, Primabalt What should I tell my health care provider before I take this medicine? They need to know if you have any of these conditions: -kidney disease -Leber's disease -megaloblastic anemia -an unusual or allergic reaction to cyanocobalamin, cobalt, other medicines, foods, dyes, or preservatives -pregnant or trying to get pregnant -breast-feeding How should I use this medicine? This medicine is injected into a muscle or deeply under the skin. It is usually given by a health care professional in a clinic or doctor's office. However, your doctor may teach you how to inject yourself. Follow all instructions. Talk to your pediatrician regarding the use of this medicine in children. Special care may be needed. Overdosage: If you think you have taken too much of this medicine contact a poison control center or emergency room at once. NOTE: This medicine is only for you. Do not share this medicine with others. What if I miss a dose? If you are given your dose at a clinic or doctor's office, call to reschedule your appointment. If you give your own injections and you miss a dose, take it as soon as you can. If it is almost time for your next dose, take only that dose. Do not take double or extra doses. What may interact with this medicine? -colchicine -heavy alcohol intake This list may not describe all possible interactions. Give your health care provider a list of all the  medicines, herbs, non-prescription drugs, or dietary supplements you use. Also tell them if you smoke, drink alcohol, or use illegal drugs. Some items may interact with your medicine. What should I watch for while using this medicine? Visit your doctor or health care professional regularly. You may need blood work done while you are taking this medicine. You may need to follow a special diet. Talk to your doctor. Limit your alcohol intake and avoid smoking to get the best benefit. What side effects may I notice from receiving this medicine? Side effects that you should report to your doctor or health care professional as soon as possible: -allergic reactions like skin rash, itching or hives, swelling of the face, lips, or tongue -blue tint to skin -chest tightness, pain -difficulty breathing, wheezing -dizziness -red, swollen painful area on the leg Side effects that usually do not require medical attention (report to your doctor or health care professional if they continue or are bothersome): -diarrhea -headache This list may not describe all possible side effects. Call your doctor for medical advice about side effects. You may report side effects to FDA at 1-800-FDA-1088. Where should I keep my medicine? Keep out of the reach of children. Store at room temperature between 15 and 30 degrees C (59 and 85 degrees F). Protect from light. Throw away any unused medicine after the expiration date. NOTE: This sheet is a summary. It may not cover all possible information. If you have questions about this medicine, talk to your doctor, pharmacist, or health care provider.  2015, Elsevier/Gold Standard. (2007-04-29 22:10:20) Fulvestrant  injection What is this medicine? FULVESTRANT (ful VES trant) blocks the effects of estrogen. It is used to treat breast cancer in women past the age of menopause. This medicine may be used for other purposes; ask your health care provider or pharmacist if you have  questions. COMMON BRAND NAME(S): FASLODEX What should I tell my health care provider before I take this medicine? They need to know if you have any of these conditions: -bleeding problems -liver disease -low levels of platelets in the blood -an unusual or allergic reaction to fulvestrant, other medicines, foods, dyes, or preservatives -pregnant or trying to get pregnant -breast-feeding How should I use this medicine? This medicine is for injection into a muscle. It is usually given by a health care professional in a hospital or clinic setting. Talk to your pediatrician regarding the use of this medicine in children. Special care may be needed. Overdosage: If you think you have taken too much of this medicine contact a poison control center or emergency room at once. NOTE: This medicine is only for you. Do not share this medicine with others. What if I miss a dose? It is important not to miss your dose. Call your doctor or health care professional if you are unable to keep an appointment. What may interact with this medicine? -medicines that treat or prevent blood clots like warfarin, enoxaparin, and dalteparin This list may not describe all possible interactions. Give your health care provider a list of all the medicines, herbs, non-prescription drugs, or dietary supplements you use. Also tell them if you smoke, drink alcohol, or use illegal drugs. Some items may interact with your medicine. What should I watch for while using this medicine? Your condition will be monitored carefully while you are receiving this medicine. You will need important blood work done while you are taking this medicine. Do not become pregnant while taking this medicine. Women should inform their doctor if they wish to become pregnant or think they might be pregnant. There is a potential for serious side effects to an unborn child. Talk to your health care professional or pharmacist for more information. What side  effects may I notice from receiving this medicine? Side effects that you should report to your doctor or health care professional as soon as possible: -allergic reactions like skin rash, itching or hives, swelling of the face, lips, or tongue -feeling faint or lightheaded, falls -fever or flu-like symptoms -sore throat -vaginal bleeding Side effects that usually do not require medical attention (report to your doctor or health care professional if they continue or are bothersome): -aches, pains -constipation or diarrhea -headache -hot flashes -nausea, vomiting -pain at site where injected -stomach pain This list may not describe all possible side effects. Call your doctor for medical advice about side effects. You may report side effects to FDA at 1-800-FDA-1088. Where should I keep my medicine? This drug is given in a hospital or clinic and will not be stored at home. NOTE: This sheet is a summary. It may not cover all possible information. If you have questions about this medicine, talk to your doctor, pharmacist, or health care provider.  2015, Elsevier/Gold Standard. (2007-05-27 15:39:24) Darbepoetin Alfa injection What is this medicine? DARBEPOETIN ALFA (dar be POE e tin AL fa) helps your body make more red blood cells. It is used to treat anemia caused by chronic kidney failure and chemotherapy. This medicine may be used for other purposes; ask your health care provider or pharmacist if you have questions.  COMMON BRAND NAME(S): Aranesp What should I tell my health care provider before I take this medicine? They need to know if you have any of these conditions: -blood clotting disorders or history of blood clots -cancer patient not on chemotherapy -cystic fibrosis -heart disease, such as angina, heart failure, or a history of a heart attack -hemoglobin level of 12 g/dL or greater -high blood pressure -low levels of folate, iron, or vitamin B12 -seizures -an unusual or  allergic reaction to darbepoetin, erythropoietin, albumin, hamster proteins, latex, other medicines, foods, dyes, or preservatives -pregnant or trying to get pregnant -breast-feeding How should I use this medicine? This medicine is for injection into a vein or under the skin. It is usually given by a health care professional in a hospital or clinic setting. If you get this medicine at home, you will be taught how to prepare and give this medicine. Do not shake the solution before you withdraw a dose. Use exactly as directed. Take your medicine at regular intervals. Do not take your medicine more often than directed. It is important that you put your used needles and syringes in a special sharps container. Do not put them in a trash can. If you do not have a sharps container, call your pharmacist or healthcare provider to get one. Talk to your pediatrician regarding the use of this medicine in children. While this medicine may be used in children as young as 1 year for selected conditions, precautions do apply. Overdosage: If you think you have taken too much of this medicine contact a poison control center or emergency room at once. NOTE: This medicine is only for you. Do not share this medicine with others. What if I miss a dose? If you miss a dose, take it as soon as you can. If it is almost time for your next dose, take only that dose. Do not take double or extra doses. What may interact with this medicine? Do not take this medicine with any of the following medications: -epoetin alfa This list may not describe all possible interactions. Give your health care provider a list of all the medicines, herbs, non-prescription drugs, or dietary supplements you use. Also tell them if you smoke, drink alcohol, or use illegal drugs. Some items may interact with your medicine. What should I watch for while using this medicine? Visit your prescriber or health care professional for regular checks on your  progress and for the needed blood tests and blood pressure measurements. It is especially important for the doctor to make sure your hemoglobin level is in the desired range, to limit the risk of potential side effects and to give you the best benefit. Keep all appointments for any recommended tests. Check your blood pressure as directed. Ask your doctor what your blood pressure should be and when you should contact him or her. As your body makes more red blood cells, you may need to take iron, folic acid, or vitamin B supplements. Ask your doctor or health care provider which products are right for you. If you have kidney disease continue dietary restrictions, even though this medication can make you feel better. Talk with your doctor or health care professional about the foods you eat and the vitamins that you take. What side effects may I notice from receiving this medicine? Side effects that you should report to your doctor or health care professional as soon as possible: -allergic reactions like skin rash, itching or hives, swelling of the face, lips, or tongue -  breathing problems -changes in vision -chest pain -confusion, trouble speaking or understanding -feeling faint or lightheaded, falls -high blood pressure -muscle aches or pains -pain, swelling, warmth in the leg -rapid weight gain -severe headaches -sudden numbness or weakness of the face, arm or leg -trouble walking, dizziness, loss of balance or coordination -seizures (convulsions) -swelling of the ankles, feet, hands -unusually weak or tired Side effects that usually do not require medical attention (report to your doctor or health care professional if they continue or are bothersome): -diarrhea -fever, chills (flu-like symptoms) -headaches -nausea, vomiting -redness, stinging, or swelling at site where injected This list may not describe all possible side effects. Call your doctor for medical advice about side effects. You  may report side effects to FDA at 1-800-FDA-1088. Where should I keep my medicine? Keep out of the reach of children. Store in a refrigerator between 2 and 8 degrees C (36 and 46 degrees F). Do not freeze. Do not shake. Throw away any unused portion if using a single-dose vial. Throw away any unused medicine after the expiration date. NOTE: This sheet is a summary. It may not cover all possible information. If you have questions about this medicine, talk to your doctor, pharmacist, or health care provider.  2015, Elsevier/Gold Standard. (2007-12-31 10:23:57)

## 2014-06-04 ENCOUNTER — Encounter (HOSPITAL_COMMUNITY): Payer: Medicare Other

## 2014-06-04 ENCOUNTER — Other Ambulatory Visit: Payer: Medicare Other

## 2014-06-04 DIAGNOSIS — D509 Iron deficiency anemia, unspecified: Secondary | ICD-10-CM

## 2014-06-04 LAB — HEMOCCULT SLIDES (X 3 CARDS)
Fecal Occult Blood: NEGATIVE
OCCULT 1: NEGATIVE
OCCULT 2: NEGATIVE
OCCULT 3: NEGATIVE
OCCULT 4: NEGATIVE
OCCULT 5: NEGATIVE

## 2014-06-05 ENCOUNTER — Encounter (HOSPITAL_COMMUNITY)
Admission: RE | Admit: 2014-06-05 | Discharge: 2014-06-05 | Disposition: A | Discharge status: Home / Self Care | Payer: Medicare Other | Source: Ambulatory Visit | Attending: Nurse Practitioner | Admitting: Nurse Practitioner

## 2014-06-05 DIAGNOSIS — C50919 Malignant neoplasm of unspecified site of unspecified female breast: Secondary | ICD-10-CM | POA: Diagnosis not present

## 2014-06-05 DIAGNOSIS — C7951 Secondary malignant neoplasm of bone: Secondary | ICD-10-CM | POA: Diagnosis present

## 2014-06-05 MED ORDER — TECHNETIUM TC 99M MEDRONATE IV KIT
25.0000 | PACK | Freq: Once | INTRAVENOUS | Status: AC | PRN
  Administered 2014-06-05: 25 via INTRAVENOUS

## 2014-06-10 ENCOUNTER — Ambulatory Visit (INDEPENDENT_AMBULATORY_CARE_PROVIDER_SITE_OTHER): Payer: Medicare Other | Admitting: Family Medicine

## 2014-06-10 ENCOUNTER — Encounter: Payer: Self-pay | Admitting: Family Medicine

## 2014-06-10 ENCOUNTER — Other Ambulatory Visit: Payer: Self-pay | Admitting: Family Medicine

## 2014-06-10 ENCOUNTER — Telehealth: Payer: Self-pay | Admitting: Oncology

## 2014-06-10 ENCOUNTER — Other Ambulatory Visit: Payer: Self-pay | Admitting: Nurse Practitioner

## 2014-06-10 ENCOUNTER — Telehealth: Payer: Self-pay | Admitting: Nurse Practitioner

## 2014-06-10 VITALS — BP 142/71 | HR 58 | Temp 97.9°F | Resp 16 | Wt 192.0 lb

## 2014-06-10 DIAGNOSIS — N182 Chronic kidney disease, stage 2 (mild): Secondary | ICD-10-CM

## 2014-06-10 DIAGNOSIS — D630 Anemia in neoplastic disease: Secondary | ICD-10-CM

## 2014-06-10 DIAGNOSIS — E039 Hypothyroidism, unspecified: Secondary | ICD-10-CM | POA: Diagnosis not present

## 2014-06-10 DIAGNOSIS — D508 Other iron deficiency anemias: Secondary | ICD-10-CM | POA: Diagnosis not present

## 2014-06-10 DIAGNOSIS — E119 Type 2 diabetes mellitus without complications: Secondary | ICD-10-CM

## 2014-06-10 LAB — CBC WITH DIFFERENTIAL/PLATELET
BASOS ABS: 0 10*3/uL (ref 0.0–0.1)
Basophils Relative: 0.5 % (ref 0.0–3.0)
Eosinophils Absolute: 0.1 10*3/uL (ref 0.0–0.7)
Eosinophils Relative: 2.2 % (ref 0.0–5.0)
HEMATOCRIT: 29.5 % — AB (ref 36.0–46.0)
HEMOGLOBIN: 9.7 g/dL — AB (ref 12.0–15.0)
Lymphocytes Relative: 23.6 % (ref 12.0–46.0)
Lymphs Abs: 1.5 10*3/uL (ref 0.7–4.0)
MCHC: 32.8 g/dL (ref 30.0–36.0)
MCV: 88.6 fl (ref 78.0–100.0)
MONO ABS: 0.4 10*3/uL (ref 0.1–1.0)
Monocytes Relative: 6.5 % (ref 3.0–12.0)
NEUTROS PCT: 67.2 % (ref 43.0–77.0)
Neutro Abs: 4.4 10*3/uL (ref 1.4–7.7)
PLATELETS: 263 10*3/uL (ref 150.0–400.0)
RBC: 3.33 Mil/uL — ABNORMAL LOW (ref 3.87–5.11)
RDW: 18.3 % — ABNORMAL HIGH (ref 11.5–15.5)
WBC: 6.6 10*3/uL (ref 4.0–10.5)

## 2014-06-10 LAB — BASIC METABOLIC PANEL
BUN: 32 mg/dL — ABNORMAL HIGH (ref 6–23)
CO2: 25 mEq/L (ref 19–32)
CREATININE: 1.04 mg/dL (ref 0.40–1.20)
Calcium: 9.4 mg/dL (ref 8.4–10.5)
Chloride: 101 mEq/L (ref 96–112)
GFR: 55.4 mL/min — AB (ref >60.00)
Glucose, Bld: 171 mg/dL — ABNORMAL HIGH (ref 70–99)
POTASSIUM: 4.6 meq/L (ref 3.5–5.1)
Sodium: 135 mEq/L (ref 135–145)

## 2014-06-10 LAB — MICROALBUMIN / CREATININE URINE RATIO
Creatinine,U: 56.1 mg/dL
Microalb Creat Ratio: 9.1 mg/g (ref 0.0–30.0)
Microalb, Ur: 5.1 mg/dL — ABNORMAL HIGH (ref 0.0–1.9)

## 2014-06-10 LAB — HEMOGLOBIN A1C: HEMOGLOBIN A1C: 7.9 % — AB (ref 4.6–6.5)

## 2014-06-10 LAB — TSH: TSH: 11.47 u[IU]/mL — ABNORMAL HIGH (ref 0.35–4.50)

## 2014-06-10 MED ORDER — PIOGLITAZONE HCL 30 MG PO TABS: 30.0000 mg | ORAL_TABLET | Freq: Every day | ORAL | Status: DC

## 2014-06-10 MED ORDER — LEVOTHYROXINE SODIUM 137 MCG PO TABS: 137.0000 ug | ORAL_TABLET | Freq: Every day | ORAL | Status: DC

## 2014-06-10 NOTE — Progress Notes (Signed)
Pre visit review using our clinic review tool, if applicable. No additional management support is needed unless otherwise documented below in the visit note. 

## 2014-06-10 NOTE — Telephone Encounter (Signed)
Called and explained the results of the bone scan on 06/05/14. There is documented progression. We plan to stop the fulvestrant and begin exemestane and everolimus daily. We discussed potential side effects such as hot flashes, vaginal dryness, pneumonitis and mucositis. She will return in 4 weeks to discuss her tolerance on these drugs with Dr. [name]. I will send in the script this week when I receive a prior authorization on the everolimus.

## 2014-06-10 NOTE — Progress Notes (Signed)
OFFICE NOTE  06/10/2014  CC:  Chief Complaint  Patient presents with  . Follow-up    2 week follow up on DM   HPI: Patient is a 74 y.o. 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  Hemoccults x 3 neg earlier this month.  Has been taking actos $RemoveBefo'15mg'nwXOwyOuwaC$  qd.  No side effects.  No further hypoglycemia since getting off insulin. Fastings and 2 H postprandials are averaging 200.  No new complaints today.  Denies BRBPR, melena, or gross hematuria.   Pertinent PMH:  Past medical, surgical, social, and family history reviewed and no changes are noted since last office visit.  MEDS:  Outpatient Prescriptions Prior to Visit  Medication Sig Dispense Refill  . amLODipine (NORVASC) 10 MG tablet Take 1 tablet (10 mg total) by mouth daily. 30 tablet 0  . aspirin 81 MG chewable tablet Chew 81 mg by mouth daily.    Marland Kitchen atorvastatin (LIPITOR) 80 MG tablet Take 1 tablet (80 mg total) by mouth daily. 30 tablet 1  . Blood Glucose Monitoring Suppl (ACCU-CHEK NANO SMARTVIEW) W/DEVICE KIT Use to check blood sugar twice a day. DX 250.00 1 kit 0  . carvedilol (COREG) 25 MG tablet Take 1 tablet (25 mg total) by mouth 2 (two) times daily with a meal. 60 tablet 6  . clopidogrel (PLAVIX) 75 MG tablet Take 1 tablet (75 mg total) by mouth daily. 30 tablet 8  . cyanocobalamin (,VITAMIN B-12,) 1000 MCG/ML injection Inject 1,000 mcg into the muscle every 30 (thirty) days.     . darbepoetin (ARANESP) 200 MCG/0.4ML SOLN Inject 200 mcg into the skin once. Every 30 days    . gabapentin (NEURONTIN) 100 MG capsule Take 1 capsule (100 mg total) by mouth 3 (three) times daily. 90 capsule 2  . glucose blood (ACCU-CHEK AVIVA) test strip Use as instructed to check blood sugar twice a day  Dx 250.00 100 each 3  . glycerin adult (GLYCERIN ADULT) 2 G SUPP Place 1 suppository rectally once as needed  (constipation). 10 suppository 0  . hydrochlorothiazide (MICROZIDE) 12.5 MG capsule Take 2 capsules (25 mg total) by mouth daily. 60 capsule 5  . isosorbide mononitrate (IMDUR) 60 MG 24 hr tablet Take 2 tablets (120 mg total) by mouth daily. (Patient taking differently: Take 60 mg by mouth 2 (two) times daily. ) 60 tablet 11  . levothyroxine (SYNTHROID, LEVOTHROID) 75 MCG tablet TAKE ONE TABLET BY MOUTH EVERY DAY BEFORE BREAKFAST 30 tablet 3  . morphine (MS CONTIN) 30 MG 12 hr tablet Take 1 tablet (30 mg total) by mouth 2 (two) times daily. 60 tablet 0  . nitroGLYCERIN (NITROSTAT) 0.4 MG SL tablet Place 1 tablet (0.4 mg total) under the tongue every 5 (five) minutes as needed for chest pain. 30 tablet 0  . omeprazole (PRILOSEC) 40 MG capsule Take 1 capsule (40 mg total) by mouth daily. 30 capsule 3  . pioglitazone (ACTOS) 15 MG tablet Take 1 tablet (15 mg total) by mouth daily. 30 tablet 0  . polyethylene glycol (MIRALAX / GLYCOLAX) packet Take 17 g by mouth daily as needed for mild constipation.     . senna-docusate (SENOKOT-S) 8.6-50 MG per tablet Take 1 tablet by mouth daily.     No facility-administered medications prior to visit.    PE: Blood pressure 142/71, pulse 58, temperature  97.9 F (36.6 C), temperature source Oral, resp. rate 16, weight 192 lb (87.091 kg), last menstrual period 03/13/2012, SpO2 99 %. Gen: Alert, well appearing.  Patient is oriented to person, place, time, and situation. No further exam today.  LABS:  Hemoccult cards neg x 3 on 06/04/14  Lab Results  Component Value Date   TSH 41.07* 03/02/2014    Lab Results  Component Value Date   WBC 5.9 06/03/2014   HGB 8.7* 06/03/2014   HCT 27.0* 06/03/2014   MCV 90.1 06/03/2014   PLT 222 06/03/2014   Lab Results  Component Value Date   HGBA1C 7.5* 03/02/2014     Chemistry      Component Value Date/Time   NA 138 06/03/2014 0936   NA 132* 05/22/2014 1104   K 4.4 06/03/2014 0936   K 5.0 05/22/2014 1104   CL  98 05/22/2014 1104   CL 104 07/08/2012 1023   CO2 22 06/03/2014 0936   CO2 27 05/22/2014 1104   BUN 28.4* 06/03/2014 0936   BUN 41* 05/22/2014 1104   CREATININE 1.2* 06/03/2014 0936   CREATININE 1.18 05/22/2014 1104   CREATININE 1.20* 09/26/2013 1331      Component Value Date/Time   CALCIUM 8.9 06/03/2014 0936   CALCIUM 8.6 05/22/2014 1104   ALKPHOS 162* 06/03/2014 0936   ALKPHOS 213* 05/22/2014 1104   AST 21 06/03/2014 0936   AST 21 05/22/2014 1104   ALT 13 06/03/2014 0936   ALT 10 05/22/2014 1104   BILITOT 0.57 06/03/2014 0936   BILITOT 0.6 05/22/2014 1104      IMPRESSION AND PLAN:  1) DM 2, not ideal control. Increase actos to 30 mg qd. Check A1c and urine microalb/cr today. Reminded pt she is overdue for diab retpthy screening exam. Will do feet exam at next f/u visit.  2) Anemia, unclear etiology but given her extensive bone mets from her breast cancer I suspect it is due to bone marrow infiltration with tumor cells and subsequently she has inadequate RBC stem cells.   Recent hemoccults neg x 3 earlier this month. Will follow CBC today.    3) Hypothyroidism: TSH 03/3014 was way up so her L4 dose was increased to one 51mcg tab qd alt/w two tabs qd. Recheck TSH today.  RF L4 as appropriate when result of TSH is in.  An After Visit Summary was printed and given to the patient.  FOLLOW UP: 3 mo

## 2014-06-10 NOTE — Telephone Encounter (Signed)
Spoke with patient and she is aware of her appointments °

## 2014-06-11 ENCOUNTER — Other Ambulatory Visit: Payer: Self-pay | Admitting: Nurse Practitioner

## 2014-06-11 ENCOUNTER — Telehealth: Payer: Self-pay | Admitting: *Deleted

## 2014-06-11 ENCOUNTER — Other Ambulatory Visit: Payer: Self-pay | Admitting: *Deleted

## 2014-06-11 DIAGNOSIS — C7951 Secondary malignant neoplasm of bone: Principal | ICD-10-CM

## 2014-06-11 DIAGNOSIS — C50911 Malignant neoplasm of unspecified site of right female breast: Secondary | ICD-10-CM

## 2014-06-11 DIAGNOSIS — C50919 Malignant neoplasm of unspecified site of unspecified female breast: Secondary | ICD-10-CM

## 2014-06-11 MED ORDER — MORPHINE SULFATE ER 30 MG PO TBCR: 30.0000 mg | EXTENDED_RELEASE_TABLET | Freq: Two times a day (BID) | ORAL | Status: DC

## 2014-06-11 MED ORDER — EVEROLIMUS 10 MG PO TABS: 10.0000 mg | ORAL_TABLET | Freq: Every day | ORAL | Status: DC

## 2014-06-11 MED ORDER — EXEMESTANE 25 MG PO TABS: 25.0000 mg | ORAL_TABLET | Freq: Every day | ORAL | Status: DC

## 2014-06-11 NOTE — Telephone Encounter (Signed)
Obtained and will be given to [facility] for pick up. Thank you

## 2014-06-11 NOTE — Telephone Encounter (Signed)
PT. WOULD LIKE TO PICK UP HER MORPHINE PRESCRIPTION ON Friday MORNING, 06/12/14. HER HUSBAND IS FOR TREATMENT TOMORROW MORNING.

## 2014-06-12 ENCOUNTER — Other Ambulatory Visit: Payer: Self-pay | Admitting: Nurse Practitioner

## 2014-06-12 ENCOUNTER — Telehealth: Payer: Self-pay | Admitting: *Deleted

## 2014-06-12 DIAGNOSIS — C7951 Secondary malignant neoplasm of bone: Principal | ICD-10-CM

## 2014-06-12 DIAGNOSIS — C50919 Malignant neoplasm of unspecified site of unspecified female breast: Secondary | ICD-10-CM

## 2014-06-12 MED ORDER — EVEROLIMUS 10 MG PO TABS: 10.0000 mg | ORAL_TABLET | Freq: Every day | ORAL | Status: DC

## 2014-06-12 MED ORDER — EXEMESTANE 25 MG PO TABS: 25.0000 mg | ORAL_TABLET | Freq: Every day | ORAL | Status: DC

## 2014-06-12 NOTE — Telephone Encounter (Signed)
[facility] faxed Prior authorization request for Afinitor.  Request to Managed [facility] for review.

## 2014-06-15 ENCOUNTER — Encounter: Payer: Self-pay | Admitting: Nurse Practitioner

## 2014-06-15 ENCOUNTER — Telehealth: Payer: Self-pay

## 2014-06-15 ENCOUNTER — Other Ambulatory Visit: Payer: Self-pay | Admitting: Family Medicine

## 2014-06-15 NOTE — Progress Notes (Signed)
I faxed prior auth req for afinitor

## 2014-06-15 NOTE — Telephone Encounter (Signed)
Prior auth for afinitor to raquel.

## 2014-06-16 ENCOUNTER — Other Ambulatory Visit: Payer: Self-pay | Admitting: Nurse Practitioner

## 2014-06-16 ENCOUNTER — Telehealth: Payer: Self-pay | Admitting: *Deleted

## 2014-06-16 ENCOUNTER — Encounter: Payer: Self-pay | Admitting: Oncology

## 2014-06-16 NOTE — Telephone Encounter (Signed)
TC from [facility] @ [facility] requesting date on Exemestane prescription-06/12/14. And also that they are working on the prior authorization for the Everolimus (Afinitor)

## 2014-06-16 NOTE — Telephone Encounter (Signed)
INFORMED PT. ON 06/15/14 A PRIOR AUTHORIZATION WAS SENT TO [facility] FOR AFINITOR. [facility] WILL CHECK TO SEE IF THERE IS ANY FINANCIAL ASSISTANCE. IF PT. DOES NOT HEAR FROM THIS OFFICE BY Friday AFTERNOON SHE WILL CALL TO SEE IF [facility] HAS CONTACTED THIS OFFICE.

## 2014-06-16 NOTE — Progress Notes (Signed)
I did an expedieted appeal for afinitor with Jordanne confirmation# [id number]. Turnaround is 72 hours. I called to let nurse(Natro) know.

## 2014-06-17 ENCOUNTER — Telehealth: Payer: Self-pay | Admitting: *Deleted

## 2014-06-17 NOTE — Telephone Encounter (Signed)
This RN faxed form to [facility] with data verifying medically necessity of medication afinitor and aromasin including:  Pt is a progressive stage IV ER/PR+ breast cancer.  Per FDA regulations aromasin is the only aromatase inhibitor studied and approved for use with afinitor for breast cancer.

## 2014-06-18 ENCOUNTER — Encounter: Payer: Self-pay | Admitting: Oncology

## 2014-06-18 NOTE — Telephone Encounter (Signed)
VM message from patient this am regarding approval of Afinitor.  Left VM message with pt that we have not heard back yet, though additional information was fax'd to [facility] yesterday afternoon.

## 2014-06-18 NOTE — Progress Notes (Signed)
PA approved for Afinitor... [facility] to fill.

## 2014-06-19 ENCOUNTER — Telehealth: Payer: Self-pay | Admitting: *Deleted

## 2014-06-19 NOTE — Telephone Encounter (Signed)
[facility], SHANNON, WILL CALL PT. CONCERNING PT. ASSISTANCE WITH [employer] AND WILL UPDATE THIS OFFICE.

## 2014-06-22 ENCOUNTER — Encounter: Payer: Self-pay | Admitting: Oncology

## 2014-06-22 ENCOUNTER — Telehealth: Payer: Self-pay | Admitting: *Deleted

## 2014-06-22 NOTE — Progress Notes (Signed)
Per Larene Beach at [employer] the patient was approved for both afinitor and aromasin. They did find asst for the afinitor for her.

## 2014-06-22 NOTE — Telephone Encounter (Signed)
This RN returned call to pt - Brittney Cunningham states she has been informed by  [facility] medication is being shipped.  Brittney Cunningham asked when she is to start-   This RN informed pt she may start as soon as she obtains- but to call this RN so lab appointments can be scheduled for monitoring.  Brittney Cunningham verbalized understanding.

## 2014-06-23 ENCOUNTER — Encounter: Payer: Self-pay | Admitting: Oncology

## 2014-06-23 NOTE — Progress Notes (Signed)
Pt is approved with [employer] for Afinitor, Exemestane and Faslodex from 06/19/14 to 06/18/15 or when the benefit cap has been met.  Expenses can be submitted for reimbursement for dos 03/21/14 to 06/18/15.  The amount of grant is $12,000.  Forwarded a copy of letter to [facility] in billing.

## 2014-06-30 ENCOUNTER — Ambulatory Visit (HOSPITAL_BASED_OUTPATIENT_CLINIC_OR_DEPARTMENT_OTHER): Payer: Medicare Other

## 2014-06-30 ENCOUNTER — Telehealth: Payer: Self-pay | Admitting: *Deleted

## 2014-06-30 ENCOUNTER — Other Ambulatory Visit: Payer: Self-pay | Admitting: *Deleted

## 2014-06-30 ENCOUNTER — Ambulatory Visit: Payer: Self-pay

## 2014-06-30 ENCOUNTER — Other Ambulatory Visit: Payer: Self-pay

## 2014-06-30 ENCOUNTER — Other Ambulatory Visit (HOSPITAL_BASED_OUTPATIENT_CLINIC_OR_DEPARTMENT_OTHER): Payer: Medicare Other

## 2014-06-30 VITALS — BP 162/44 | HR 64 | Temp 98.3°F

## 2014-06-30 DIAGNOSIS — E538 Deficiency of other specified B group vitamins: Secondary | ICD-10-CM | POA: Diagnosis not present

## 2014-06-30 DIAGNOSIS — N189 Chronic kidney disease, unspecified: Secondary | ICD-10-CM

## 2014-06-30 DIAGNOSIS — Z95828 Presence of other vascular implants and grafts: Secondary | ICD-10-CM

## 2014-06-30 DIAGNOSIS — D631 Anemia in chronic kidney disease: Secondary | ICD-10-CM

## 2014-06-30 DIAGNOSIS — C7951 Secondary malignant neoplasm of bone: Secondary | ICD-10-CM

## 2014-06-30 DIAGNOSIS — C50911 Malignant neoplasm of unspecified site of right female breast: Secondary | ICD-10-CM

## 2014-06-30 DIAGNOSIS — Z452 Encounter for adjustment and management of vascular access device: Secondary | ICD-10-CM

## 2014-06-30 DIAGNOSIS — C50919 Malignant neoplasm of unspecified site of unspecified female breast: Secondary | ICD-10-CM

## 2014-06-30 LAB — COMPREHENSIVE METABOLIC PANEL (CC13)
ALBUMIN: 3 g/dL — AB (ref 3.5–5.0)
ALT: 7 U/L (ref 0–55)
AST: 20 U/L (ref 5–34)
Alkaline Phosphatase: 130 U/L (ref 40–150)
Anion Gap: 13 mEq/L — ABNORMAL HIGH (ref 3–11)
BUN: 28.8 mg/dL — ABNORMAL HIGH (ref 7.0–26.0)
CALCIUM: 9 mg/dL (ref 8.4–10.4)
CHLORIDE: 105 meq/L (ref 98–109)
CO2: 22 mEq/L (ref 22–29)
Creatinine: 1.1 mg/dL (ref 0.6–1.1)
EGFR: 50 mL/min/{1.73_m2} — ABNORMAL LOW (ref >90)
Glucose: 138 mg/dl (ref 70–140)
POTASSIUM: 4.6 meq/L (ref 3.5–5.1)
Sodium: 140 mEq/L (ref 136–145)
Total Bilirubin: 0.35 mg/dL (ref 0.20–1.20)
Total Protein: 6.7 g/dL (ref 6.4–8.3)

## 2014-06-30 LAB — CBC WITH DIFFERENTIAL/PLATELET
BASO%: 0.6 % (ref 0.0–2.0)
Basophils Absolute: 0 10*3/uL (ref 0.0–0.1)
EOS%: 2.6 % (ref 0.0–7.0)
Eosinophils Absolute: 0.1 10*3/uL (ref 0.0–0.5)
HCT: 25.7 % — ABNORMAL LOW (ref 34.8–46.6)
HEMOGLOBIN: 8.4 g/dL — AB (ref 11.6–15.9)
LYMPH%: 30.4 % (ref 14.0–49.7)
MCH: 30 pg (ref 25.1–34.0)
MCHC: 32.5 g/dL (ref 31.5–36.0)
MCV: 92.2 fL (ref 79.5–101.0)
MONO#: 0.4 10*3/uL (ref 0.1–0.9)
MONO%: 8.8 % (ref 0.0–14.0)
NEUT#: 2.7 10*3/uL (ref 1.5–6.5)
NEUT%: 57.6 % (ref 38.4–76.8)
PLATELETS: 204 10*3/uL (ref 145–400)
RBC: 2.79 10*6/uL — ABNORMAL LOW (ref 3.70–5.45)
RDW: 18.6 % — ABNORMAL HIGH (ref 11.2–14.5)
WBC: 4.6 10*3/uL (ref 3.9–10.3)
lymph#: 1.4 10*3/uL (ref 0.9–3.3)

## 2014-06-30 MED ORDER — SODIUM CHLORIDE 0.9 % IJ SOLN
10.0000 mL | INTRAMUSCULAR | Status: DC | PRN
  Administered 2014-06-30: 10 mL via INTRAVENOUS
  Filled 2014-06-30: qty 10

## 2014-06-30 MED ORDER — EVEROLIMUS 5 MG PO TABS: 5.0000 mg | ORAL_TABLET | Freq: Every day | ORAL | Status: DC

## 2014-06-30 MED ORDER — HEPARIN SOD (PORK) LOCK FLUSH 100 UNIT/ML IV SOLN
500.0000 [IU] | Freq: Once | INTRAVENOUS | Status: AC
  Administered 2014-06-30: 500 [IU] via INTRAVENOUS
  Filled 2014-06-30: qty 5

## 2014-06-30 MED ORDER — CYANOCOBALAMIN 1000 MCG/ML IJ SOLN
1000.0000 ug | Freq: Once | INTRAMUSCULAR | Status: AC
  Administered 2014-06-30: 1000 ug via INTRAMUSCULAR

## 2014-06-30 MED ORDER — DARBEPOETIN ALFA 200 MCG/0.4ML IJ SOSY
200.0000 ug | PREFILLED_SYRINGE | Freq: Once | INTRAMUSCULAR | Status: AC
  Administered 2014-06-30: 200 ug via SUBCUTANEOUS
  Filled 2014-06-30: qty 0.4

## 2014-06-30 NOTE — Patient Instructions (Signed)

## 2014-06-30 NOTE — Telephone Encounter (Signed)
This RN spoke with pt per her inquiry stating [facility] cannot send the prescription " because they said they were waiting to hear from Dr Jannifer Rodney due to an interaction with some of my current medications and the everolimus ".  This RN called to [facility] and due to no pharmacist available left a VM.  Call returned from pharmacist at [facility]. He states possible interaction with everolimus and Coreg and Norvasc. These two drugs could cause greater concentration of absorption of the everolimus - therefore increasing side effects.  Diagnosis verified as breast cancer and recommendation per Juanda Crumble is to start at 50% reduction.  This RN discussed above with MD who stated to start pt on everolimus at 5mg  vs 10mg .  New prescription obtained and sent to [facility].

## 2014-07-02 ENCOUNTER — Telehealth: Payer: Self-pay | Admitting: *Deleted

## 2014-07-02 NOTE — Telephone Encounter (Signed)
Pt called stating that she was in the office last week and was told to call back if the swelling in her legs did not improve. She stated that the swelling has not improved and she is also having some shortness of breath when she lays down other wise her breathing is fine. She denied any chest pain. Pharm: [facility].

## 2014-07-03 NOTE — Telephone Encounter (Signed)
Pt advised and voiced understanding.   

## 2014-07-03 NOTE — Telephone Encounter (Signed)
Have her stop her pioglitazone (actos) for the time being.  This med is notorious for causing fluid retention/swelling, and we recently increased her dose. Elevate legs above level of heart for 20 min at least once a day. Eat low Na diet (2g or less per day). Office visit for f/u in 6-7 days.-thx

## 2014-07-08 ENCOUNTER — Telehealth: Payer: Self-pay | Admitting: Oncology

## 2014-07-08 ENCOUNTER — Ambulatory Visit (HOSPITAL_BASED_OUTPATIENT_CLINIC_OR_DEPARTMENT_OTHER): Payer: Medicare Other | Admitting: Oncology

## 2014-07-08 ENCOUNTER — Other Ambulatory Visit (HOSPITAL_BASED_OUTPATIENT_CLINIC_OR_DEPARTMENT_OTHER): Payer: Medicare Other

## 2014-07-08 VITALS — BP 142/68 | HR 65 | Temp 98.1°F | Resp 18 | Ht 63.0 in | Wt 195.3 lb

## 2014-07-08 DIAGNOSIS — D649 Anemia, unspecified: Secondary | ICD-10-CM

## 2014-07-08 DIAGNOSIS — D631 Anemia in chronic kidney disease: Secondary | ICD-10-CM

## 2014-07-08 DIAGNOSIS — N189 Chronic kidney disease, unspecified: Secondary | ICD-10-CM

## 2014-07-08 DIAGNOSIS — C50919 Malignant neoplasm of unspecified site of unspecified female breast: Secondary | ICD-10-CM

## 2014-07-08 DIAGNOSIS — Z170 Estrogen receptor positive status [ER+]: Secondary | ICD-10-CM

## 2014-07-08 DIAGNOSIS — C7951 Secondary malignant neoplasm of bone: Secondary | ICD-10-CM

## 2014-07-08 DIAGNOSIS — C50911 Malignant neoplasm of unspecified site of right female breast: Secondary | ICD-10-CM

## 2014-07-08 DIAGNOSIS — Z79811 Long term (current) use of aromatase inhibitors: Secondary | ICD-10-CM

## 2014-07-08 LAB — CBC WITH DIFFERENTIAL/PLATELET
BASO%: 0.6 % (ref 0.0–2.0)
Basophils Absolute: 0 10*3/uL (ref 0.0–0.1)
EOS%: 2.3 % (ref 0.0–7.0)
Eosinophils Absolute: 0.1 10*3/uL (ref 0.0–0.5)
HCT: 25.9 % — ABNORMAL LOW (ref 34.8–46.6)
HEMOGLOBIN: 8.4 g/dL — AB (ref 11.6–15.9)
LYMPH#: 1.6 10*3/uL (ref 0.9–3.3)
LYMPH%: 29.4 % (ref 14.0–49.7)
MCH: 30.1 pg (ref 25.1–34.0)
MCHC: 32.3 g/dL (ref 31.5–36.0)
MCV: 93 fL (ref 79.5–101.0)
MONO#: 0.6 10*3/uL (ref 0.1–0.9)
MONO%: 10.3 % (ref 0.0–14.0)
NEUT#: 3.1 10*3/uL (ref 1.5–6.5)
NEUT%: 57.4 % (ref 38.4–76.8)
Platelets: 204 10*3/uL (ref 145–400)
RBC: 2.79 10*6/uL — AB (ref 3.70–5.45)
RDW: 18.1 % — ABNORMAL HIGH (ref 11.2–14.5)
WBC: 5.5 10*3/uL (ref 3.9–10.3)

## 2014-07-08 LAB — COMPREHENSIVE METABOLIC PANEL (CC13)
ALT: 8 U/L (ref 0–55)
AST: 24 U/L (ref 5–34)
Albumin: 3.1 g/dL — ABNORMAL LOW (ref 3.5–5.0)
Alkaline Phosphatase: 126 U/L (ref 40–150)
Anion Gap: 10 mEq/L (ref 3–11)
BILIRUBIN TOTAL: 0.45 mg/dL (ref 0.20–1.20)
BUN: 31.3 mg/dL — AB (ref 7.0–26.0)
CO2: 24 mEq/L (ref 22–29)
Calcium: 9.1 mg/dL (ref 8.4–10.4)
Chloride: 105 mEq/L (ref 98–109)
Creatinine: 1.2 mg/dL — ABNORMAL HIGH (ref 0.6–1.1)
EGFR: 44 mL/min/{1.73_m2} — ABNORMAL LOW (ref >90)
Glucose: 126 mg/dl (ref 70–140)
Potassium: 4.3 mEq/L (ref 3.5–5.1)
SODIUM: 139 meq/L (ref 136–145)
Total Protein: 6.9 g/dL (ref 6.4–8.3)

## 2014-07-08 MED ORDER — MORPHINE SULFATE ER 30 MG PO TBCR: 30.0000 mg | EXTENDED_RELEASE_TABLET | Freq: Two times a day (BID) | ORAL | Status: DC

## 2014-07-08 NOTE — Progress Notes (Signed)
Patient ID: Brittney Cunningham, female   DOB: Mar 30, 1940, 74 y.o.   MRN: [id number]  [place]  [phone]) [phone] [phone]) [phone]  OFFICE PROGRESS NOTE   ID: ARDITH TEST   DOB: 06-Apr-1940  MR#: [id number]  KPT#:[id number]   PCP: [name], MD SU: Rolm Bookbinder OTHER MD: Teena Dunk, Kyung Rudd, Kevin Supple, Peter Martinique  CHIEF COMPLAINT:  Metastatic Breast Cancer CURRENT TREATMENT: Exemestane, everolimus, aranesp, B-12, denosumab  HISTORY OF PRESENT ILLNESS: Brittney Cunningham was diagnosed with right breast carcinoma in May of 2011, a biopsy showing a grade 2 invasive ductal carcinoma, T2 NXM1, stage IV at presentation. There was bone only involvement. Tumor was strongly ER and PR positive, HER-2/neu negative, with an MIP-1 of 26%.  She was treated neoadjuvantly with letrozole and zoledronic acid beginning in June of 2011. She is status post right modified radical mastectomy in February 2012 for a ypT2 ypN2, grade 1 invasive ductal carcinoma. Margins were negative.   Her subsequent history is as detailed below   INTERVAL HISTORY: [name] returns today for follow up of her metastatic breast cancer accompanied by her husband and daughter. At the last visit we reviewed her bone scan which shows at least one new spot, and what is more worrisome suggests diffuse uptake throughout the bones. [name] is having some discomfort in her right scapular area which is where she has the new "bony spots". She is also weak and fatigued. This is doubtless due to her anemia, which is not responding to the current dose of Aranesp. She was recently transfused. We stopped her fulvestrant in May since it was not working and started exemestane and everolimus. She took the first everolimus dose this morning. Otherwise she tells me she gets short of breath when walking up stairs, has back and joint pain here in there which is however not more intense or persistent than before, and that her diabetes  is moderately well-controlled.  REVIEW OF SYSTEMS: [name] has had some episodes of hypoglycemia, and this is being worked through her primary care physician. She tells me she is meticulously cleaning on flossing her teeth and that she hasn't had any further problems now for 2 years. She denies unusual headaches, nausea, vomiting, or falls. She denies a cough. She has no mouth sores or sore throat. She is constipated from the narcotics she takes daily. She uses MiraLAX and stool softeners for that. A detailed review of systems today was otherwise stable  PAST MEDICAL HISTORY: Past Medical History  Diagnosis Date  . 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 Depression   . Diabetes mellitus type II   . GERD (gastroesophageal reflux disease)   . Hyperlipidemia   . Hypothyroidism   . Osteoarthritis   . Carotid artery stenosis     bilateral  . CAD (coronary artery disease)     medical therapy, old tot RCA, patent LAD, mod. severe ostial LCX stenosis  . OSA (obstructive sleep apnea)   . 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 Hypertension   . Anxiety   . Carotid artery occlusion   . Unspecified constipation 03/15/2012  . Bursitis   . Collagen vascular disease   . History of radiation therapy 10/31/12-11/13/12    rt&lt humerous 30Gy/71fx  . Valvular heart disease 07/11/2013  . Diarrhea 09/28/2013  . Chronic renal insufficiency, stage II (mild)     Borderline  stage II/III, CrCl 50s/60s.  . History of blood transfusion   . Urinary tract bacterial infections     PAST SURGICAL HISTORY: Past Surgical History  Procedure Laterality Date  . Incisional breast biopsy      remoted left breast biopsy  . Cesarean section    . Other surgical history      GYN surgery  . Knee surgery      right knee surgery  . Carotid endarterectomy  2011    right carotid  . Port-a-cath removal    . Modified mastectomy       Right breast  . Breast surgery    . Mastectomy    . Carotid stent      left  . Cardiac catheterization  06/2013    old total occ. of RCA, moderately severe ostial LCX stenosis- medical therapy- would be complex PCI  . Carotid angiogram N/A 03/01/2011    Procedure: CAROTID ANGIOGRAM;  Surgeon: Conrad Jemison, MD;  Location: [facility] CATH LAB;  Service: Cardiovascular;  Laterality: N/A;  . Arch aortogram  03/01/2011    Procedure: ARCH AORTOGRAM;  Surgeon: Conrad [place] Gate, MD;  Location: [facility] CATH LAB;  Service: Cardiovascular;;  . Carotid stent insertion Left 03/15/2011    Procedure: CAROTID STENT INSERTION;  Surgeon: Serafina Mitchell, MD;  Location: [facility] CATH LAB;  Service: Cardiovascular;  Laterality: Left;  . Left heart catheterization with coronary angiogram N/A 07/07/2013    Procedure: LEFT HEART CATHETERIZATION WITH CORONARY ANGIOGRAM;  Surgeon: Blane Ohara, MD;  Location: [facility] CATH LAB;  Service: Cardiovascular;  Laterality: N/A;  . Carotid dopplers  05/2014    Patent ICAs, bilat ECA dz: no change compared to 2014    FAMILY HISTORY Family History  Problem Relation Age of Onset  . Arthritis    . Coronary artery disease      first degree relative  . Stroke      first degree relative  . Diabetes Mother   . Heart disease Mother   . Hypertension Mother   . Cancer Mother   . Deep vein thrombosis Mother   . Colon cancer Neg Hx   . Stomach cancer Neg Hx   . Cancer Father     Pancreatic cancer  . Breast cancer Paternal Aunt 49  The patient's father died from pancreatic cancer at the age of 54. The patient's mother died from complications of diabetes and heart disease at the age of 50. The patient has 2 sisters and 2 brothers. There is no history of breast cancer or ovarian cancer in the immediate family. One of the patient's paternal aunts out of 6 paternal aunts had breast cancer diagnosed in her 68s.    GYNECOLOGIC HISTORY: The patient is GX, P3. First pregnancy to term age 63. She went through  the change of life in her late 46s. She never took hormones.   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 Her husband, [name], is disabled secondary to a fall.  He has difficulty with walking. He used to work for [employer] previously. The patient's son, [name], lives in [place], and works for [employer]. Daughter, [name], lives in [place], and works for [employer]. Daughter, [name], lives in [place], [place], and works as a Geologist, engineering. The patient attends [employer].  ADVANCED DIRECTIVES: Not on file  HEALTH MAINTENANCE:  (Updated 04/23/2013) History  Substance Use Topics  . Smoking status: Former Smoker --  1.00 packs/day for 20 years    Types: Cigarettes    Quit date: 01/31/1988  . Smokeless tobacco: Never Used  . Alcohol Use: No    Colonoscopy: Not on file  PAP: Not on file  Bone density: July 2011, Normal  Lipid panel: 06/26/2012  Allergies  Allergen Reactions  . Chlorhexidine Hives, Itching and Rash    This was most likely a CONTACT DERMATITIS versus true systemic allergic reaction  . Adhesive [Tape] Hives  . Codeine Swelling  . Latex     Current Outpatient Prescriptions  Medication Sig Dispense Refill  . amLODipine (NORVASC) 10 MG tablet TAKE ONE TABLET BY MOUTH EVERY DAY 30 tablet 6  . aspirin 81 MG chewable tablet Chew 81 mg by mouth daily.    Marland Kitchen atorvastatin (LIPITOR) 80 MG tablet Take 1 tablet (80 mg total) by mouth daily. 30 tablet 1  . Blood Glucose Monitoring Suppl (ACCU-CHEK NANO SMARTVIEW) W/DEVICE KIT Use to check blood sugar twice a day. DX 250.00 1 kit 0  . carvedilol (COREG) 25 MG tablet Take 1 tablet (25 mg total) by mouth 2 (two) times daily with a meal. 60 tablet 6  . clopidogrel (PLAVIX) 75 MG tablet Take 1 tablet (75 mg total) by mouth daily. 30 tablet 8  . cyanocobalamin (,VITAMIN B-12,) 1000 MCG/ML injection Inject 1,000 mcg into the muscle every 30 (thirty)  days.     . darbepoetin (ARANESP) 200 MCG/0.4ML SOLN Inject 200 mcg into the skin once. Every 30 days    . everolimus (AFINITOR) 5 MG tablet Take 1 tablet (5 mg total) by mouth daily. 30 tablet 6  . exemestane (AROMASIN) 25 MG tablet Take 1 tablet (25 mg total) by mouth daily after breakfast. 30 tablet 2  . gabapentin (NEURONTIN) 100 MG capsule Take 1 capsule (100 mg total) by mouth 3 (three) times daily. 90 capsule 2  . glucose blood (ACCU-CHEK AVIVA) test strip Use as instructed to check blood sugar twice a day  Dx 250.00 100 each 3  . glycerin adult (GLYCERIN ADULT) 2 G SUPP Place 1 suppository rectally once as needed (constipation). 10 suppository 0  . hydrochlorothiazide (MICROZIDE) 12.5 MG capsule Take 2 capsules (25 mg total) by mouth daily. 60 capsule 5  . isosorbide mononitrate (IMDUR) 60 MG 24 hr tablet Take 2 tablets (120 mg total) by mouth daily. (Patient taking differently: Take 60 mg by mouth 2 (two) times daily. ) 60 tablet 11  . levothyroxine (SYNTHROID, LEVOTHROID) 137 MCG tablet Take 1 tablet (137 mcg total) by mouth daily before breakfast. 30 tablet 2  . morphine (MS CONTIN) 30 MG 12 hr tablet Take 1 tablet (30 mg total) by mouth 2 (two) times daily. 60 tablet 0  . nitroGLYCERIN (NITROSTAT) 0.4 MG SL tablet Place 1 tablet (0.4 mg total) under the tongue every 5 (five) minutes as needed for chest pain. 30 tablet 0  . omeprazole (PRILOSEC) 40 MG capsule Take 1 capsule (40 mg total) by mouth daily. 30 capsule 3  . pioglitazone (ACTOS) 30 MG tablet Take 1 tablet (30 mg total) by mouth daily. 30 tablet 2  . polyethylene glycol (MIRALAX / GLYCOLAX) packet Take 17 g by mouth daily as needed for mild constipation.     . senna-docusate (SENOKOT-S) 8.6-50 MG per tablet Take 1 tablet by mouth daily.    . [DISCONTINUED] simvastatin (ZOCOR) 40 MG tablet Take 40 mg by mouth every evening.     No current facility-administered medications for this visit.  OBJECTIVE: Elderly white woman  walking with a cane Filed Vitals:   07/08/14 0814  BP: 142/68  Pulse: 65  Temp: 98.1 F (36.7 C)  Resp: 18     Body mass index is 34.6 kg/(m^2).    ECOG FS: 2 Filed Weights   07/08/14 0814  Weight: 195 lb 4.8 oz (88.587 kg)   Sclerae unicteric, pupils round and equal Oropharynx clear and moist-- the right maxilla is severely recessed, with no teeth there. In the posterior aspect of the left maxilla there is a small sequestrum. Multiple teeth are missing. The gums appear intact No cervical or supraclavicular adenopathy Lungs no rales or rhonchi Heart regular rate and rhythm Abd soft, obese, nontender, positive bowel sounds MSK no focal spinal tenderness, no upper extremity lymphedema Neuro: nonfocal, well oriented, appropriate affect Breasts: The right breast is status post mastectomy. There is no evidence of local recurrence. The right axilla is benign per the left breast is unremarkable    LAB RESULTS: Lab Results  Component Value Date   WBC 5.5 07/08/2014   NEUTROABS 3.1 07/08/2014   HGB 8.4* 07/08/2014   HCT 25.9* 07/08/2014   MCV 93.0 07/08/2014   PLT 204 07/08/2014      Chemistry      Component Value Date/Time   NA 140 06/30/2014 1043   NA 135 06/10/2014 1034   K 4.6 06/30/2014 1043   K 4.6 06/10/2014 1034   CL 101 06/10/2014 1034   CL 104 07/08/2012 1023   CO2 22 06/30/2014 1043   CO2 25 06/10/2014 1034   BUN 28.8* 06/30/2014 1043   BUN 32* 06/10/2014 1034   CREATININE 1.1 06/30/2014 1043   CREATININE 1.04 06/10/2014 1034   CREATININE 1.20* 09/26/2013 1331      Component Value Date/Time   CALCIUM 9.0 06/30/2014 1043   CALCIUM 9.4 06/10/2014 1034   ALKPHOS 130 06/30/2014 1043   ALKPHOS 213* 05/22/2014 1104   AST 20 06/30/2014 1043   AST 21 05/22/2014 1104   ALT 7 06/30/2014 1043   ALT 10 05/22/2014 1104   BILITOT 0.35 06/30/2014 1043   BILITOT 0.6 05/22/2014 1104      STUDIES: CLINICAL DATA: Breast cancer metastatic to bone  EXAM: NUCLEAR  MEDICINE WHOLE BODY BONE SCAN  TECHNIQUE: Whole body anterior and posterior images were obtained approximately 3 hours after intravenous injection of radiopharmaceutical.  RADIOPHARMACEUTICALS: 25.3 mCi Technetium-75m MDP IV  COMPARISON: 11/16/2010 ; radiographic correlation CT cervical spine 10/19/2013, CT chest 07/05/2013, PET-CT 10/15/2012  FINDINGS: Abnormal foci of increased osseous tracer accumulation identified at the anterior RIGHT fifth and sixth ribs and at the anterior LEFT seventh rib consistent with metastases.  Scattered uptake within costal cartilage.  New focus of increased uptake at inferior RIGHT scapula.  Uptake at elbows and knees typically degenerative.  Photopenic region seen centrally within the proximal tibia, may be related to artifact from increased trace localization at the medial and lateral tibial plateau but cannot completely exclude bone lesion at this site.  Increased uptake at LEFT hip which could be degenerative or due to metastatic disease.  Innumerable additional sites of osseous metastatic disease as identified by prior CT exams are scintigraphically occult.  Slightly decreased urinary tract and soft tissue distribution of tracer.  IMPRESSION: Widespread osseous metastatic disease by prior CT exams, poorly visualized scintigraphically, suspect related to diffusely increased tracer localization throughout osseous structures.  New focus of increased tracer localization RIGHT scapula.  Cannot exclude photopenic defect at proximal LEFT tibia though  this could be an artifact related to increased uptake at the adjacent medial and lateral tibial plateaus ; recommend radiographic correlation to exclude lytic lesion.   Electronically Signed  By: Lavonia Dana M.D.  On: 06/05/2014 15:27  ASSESSMENT: 74 y.o. [place] woman:   (1) Status post right breast biopsy in 05/2009 for a grade 2 invasive ductal carcinoma, T2  NX M1, Stage IV, with bone-only involvement, strongly estrogen and progesterone receptor-positive, HER-2-negative with an MIB-1 of 26%.   (2) Neoadjuvantly she received Letrozole and zoledronic acid beginning in June of 2011 and underwent right modified radical mastectomy February of 2012 for a ypT2 ypN2, grade 1 invasive ductal carcinoma with negative margins.   (3) She completed radiation therapy in August of 2012.   (4) She has continued on Letrozole but was switched from Zoledronic acid to Denosumab Delton See) because of concerns regarding her serum creatinine. Delton See was being given every 8 weeks.  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  (6) symptomatic anemia, with creatinine clearance < 60 cc/min; Darbepoietin Q14d started 11/03/2011; received Feraheme 01/05/2012  (7) On subcutaneous B-12 supplementation monthly.  (8) Pain in left upper extremity with osseous metastasis, osteoarthritis, tendinopathy, and bursitis as confirmed by recent MRI.  (9) Anemia, multifactorial with renal disease, on Aranesp monthly, increased to every 2 weeks June 2016  (10)  letrozole was discontinued in September 2014 with evidence of disease progression. She was started on fulvestrant injections, first given on 10/17/2012; stopped 06/03/2014 with progression  (11) exemestane and everolimus started 07/08/2014   PLAN: I spent 55 minutes today with [name] and her family going over her situation. She does have progressive disease in the bones and it is worrisome that we see diffuse uptake as well as at least one new spot. Accordingly we are stopping the fulvestrant, and switching to exemestane and everolimus.  Today we discussed the possible toxicities, side effects and complications of this combination. Specifically I warned her to immediately let us know if she develops a persistent cough  and also of course if she develops any mouth or throat soreness.  The monthly Aranesp is really not being effective. We are going to increase the frequency to every 2 weeks. I will obtain a ferritin with the next set of labs to make sure we don't have borderline iron deficiency compromising the effectiveness of the Aranesp.  I am also worried that she has diffuse bony involvement. We have not been giving her either bisphosphonates or denosumab because of the history of osteonecrosis of the jaw. It has been more than 2 years ago since she had that problem, her mouth is "okay" at this point and I think taking it all in all it is in her interest to receive denosumab. Accordingly we are resuming Xgeva as of next week, but we will do it every 3 months instead of monthly. She has been prompted to keep her mouth brushed and flawless stent she is also using Biotene. If she develops any further problems with 30 she will let us know  I have set her up for repeat chest CT next week to service and new baseline. She will see [place] again in mid July. At that point if the CT scan we are obtaining this month is informative we would repeat that before her September visit with me. I am also going to start following her CA-27-29 on a regular  basis.  Talulah has a good understanding of the overall plan. She agrees with it. She knows the goal of treatment in her case is control. She will call with any problems that may develop before her next visit here.  Chauncey Cruel, MD  07/08/2014  8:28 AM

## 2014-07-08 NOTE — Telephone Encounter (Signed)
Appointments made and avs printed for patient °

## 2014-07-09 ENCOUNTER — Telehealth: Payer: Self-pay | Admitting: *Deleted

## 2014-07-09 NOTE — Telephone Encounter (Signed)
[facility] - morphine prescription ready and can be picked up before 4 pm.  Pt to call clinic if she has any questions.

## 2014-07-09 NOTE — Telephone Encounter (Signed)
PT. DID NOT GET THE PRESCRIPTION FOR MORPHINE 30MG  FROM DR.MAGRINAT YESTERDAY. SHE WILL BE IN TOWN TOMORROW AND WOULD LIKE TO PICK UP THE PRESCRIPTION. PLEASE CALL PT. WHEN PRESCRIPTION IS READY.

## 2014-07-10 ENCOUNTER — Encounter: Payer: Self-pay | Admitting: Cardiology

## 2014-07-10 ENCOUNTER — Ambulatory Visit (INDEPENDENT_AMBULATORY_CARE_PROVIDER_SITE_OTHER): Payer: Self-pay | Admitting: Cardiology

## 2014-07-10 VITALS — BP 128/38 | HR 71 | Ht 62.5 in | Wt 194.5 lb

## 2014-07-10 DIAGNOSIS — E782 Mixed hyperlipidemia: Secondary | ICD-10-CM

## 2014-07-10 DIAGNOSIS — I10 Essential (primary) hypertension: Secondary | ICD-10-CM

## 2014-07-10 DIAGNOSIS — I2510 Atherosclerotic heart disease of native coronary artery without angina pectoris: Secondary | ICD-10-CM

## 2014-07-10 NOTE — Patient Instructions (Signed)
Continue your current therapy  We will see you again in about 6 months

## 2014-07-10 NOTE — Progress Notes (Signed)
Cardiology Office Note   Date:  07/10/2014   ID:  Brittney Cunningham, DOB 1941-01-14, MRN [id number]  PCP:  Jeoffrey Massed, MD  Cardiologist:  Dr. P. [place]     Chief Complaint  Patient presents with  . Follow-up    2-54month followup; no chest pain, occassional shortness of breath-with walking, has edema in legs, feeta and ankles, no pain in legs, no cramping in legs, occassional lightheadedness, occassional dizziness      History of Present Illness: Brittney Cunningham is a 74 y.o. female who presents for follow up  CAD.  She has a history of known coronary artery disease, carotid artery stenosis status post right carotid endarterectomy and stenting in the left carotid, diabetes, hypertension, dyslipidemia, stage IV breast cancer who presented to [facility] with chest pain-07/05/13 and SOB.  Neg. MI, cardiac cath revealed  Total occlusion of the RCA, chronic  2. Patency of the left main and LAD  3. Moderately severe ostial left circumflex stenosis. PCI of LCX would be complex so medical therapy planned.  Since visit last year she has done very well from a cardiac standpoint. She denies any chest pain or SOB. She has been diagnosed with progressive bony mets from her breast CA and she is having her chemotherapy adjusted. She has noted some edema since she was started on Actos. This was stopped and her edema is improving.    Past Medical History  Diagnosis Date  . 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 Depression   . Diabetes mellitus type II   . GERD (gastroesophageal reflux disease)   . Hyperlipidemia   . Hypothyroidism   . Osteoarthritis   . Carotid artery stenosis     bilateral  . CAD (coronary artery disease)     medical therapy, old tot RCA, patent LAD, mod. severe ostial LCX stenosis  . OSA (obstructive sleep apnea)   . 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 Hypertension   . Anxiety     . Carotid artery occlusion   . Unspecified constipation 03/15/2012  . Bursitis   . Collagen vascular disease   . History of radiation therapy 10/31/12-11/13/12    rt&lt humerous 30Gy/41fx  . Valvular heart disease 07/11/2013  . Diarrhea 09/28/2013  . Chronic renal insufficiency, stage II (mild)     Borderline stage II/III, CrCl 50s/60s.  . History of blood transfusion   . Urinary tract bacterial infections     Past Surgical History  Procedure Laterality Date  . Incisional breast biopsy      remoted left breast biopsy  . Cesarean section    . Other surgical history      GYN surgery  . Knee surgery      right knee surgery  . Carotid endarterectomy  2011    right carotid  . Port-a-cath removal    . Modified mastectomy      Right breast  . Breast surgery    . Mastectomy    . Carotid stent      left  . Cardiac catheterization  06/2013    old total occ. of RCA, moderately severe ostial LCX stenosis- medical therapy- would be complex PCI  . Carotid angiogram N/A 03/01/2011    Procedure: CAROTID ANGIOGRAM;  Surgeon: Fransisco Hertz, MD;  Location: [facility] CATH LAB;  Service: Cardiovascular;  Laterality: N/A;  . Arch aortogram  03/01/2011  Procedure: ARCH AORTOGRAM;  Surgeon: Conrad , MD;  Location: [facility] CATH LAB;  Service: Cardiovascular;;  . Carotid stent insertion Left 03/15/2011    Procedure: CAROTID STENT INSERTION;  Surgeon: Serafina Mitchell, MD;  Location: [facility] CATH LAB;  Service: Cardiovascular;  Laterality: Left;  . Left heart catheterization with coronary angiogram N/A 07/07/2013    Procedure: LEFT HEART CATHETERIZATION WITH CORONARY ANGIOGRAM;  Surgeon: Blane Ohara, MD;  Location: [facility] CATH LAB;  Service: Cardiovascular;  Laterality: N/A;  . Carotid dopplers  05/2014    Patent ICAs, bilat ECA dz: no change compared to 2014     Current Outpatient Prescriptions  Medication Sig Dispense Refill  . amLODipine (NORVASC) 10 MG tablet TAKE ONE TABLET BY MOUTH EVERY DAY 30 tablet 6  .  aspirin 81 MG chewable tablet Chew 81 mg by mouth daily.    Marland Kitchen atorvastatin (LIPITOR) 80 MG tablet Take 1 tablet (80 mg total) by mouth daily. 30 tablet 1  . Blood Glucose Monitoring Suppl (ACCU-CHEK NANO SMARTVIEW) W/DEVICE KIT Use to check blood sugar twice a day. DX 250.00 1 kit 0  . carvedilol (COREG) 25 MG tablet Take 1 tablet (25 mg total) by mouth 2 (two) times daily with a meal. 60 tablet 6  . clopidogrel (PLAVIX) 75 MG tablet Take 1 tablet (75 mg total) by mouth daily. 30 tablet 8  . cyanocobalamin (,VITAMIN B-12,) 1000 MCG/ML injection Inject 1,000 mcg into the muscle every 30 (thirty) days.     . darbepoetin (ARANESP) 200 MCG/0.4ML SOLN Inject 200 mcg into the skin once. Every 30 days    . everolimus (AFINITOR) 5 MG tablet Take 1 tablet (5 mg total) by mouth daily. 30 tablet 6  . exemestane (AROMASIN) 25 MG tablet Take 1 tablet (25 mg total) by mouth daily after breakfast. 30 tablet 2  . gabapentin (NEURONTIN) 100 MG capsule Take 1 capsule (100 mg total) by mouth 3 (three) times daily. 90 capsule 2  . glucose blood (ACCU-CHEK AVIVA) test strip Use as instructed to check blood sugar twice a day  Dx 250.00 100 each 3  . glycerin adult (GLYCERIN ADULT) 2 G SUPP Place 1 suppository rectally once as needed (constipation). 10 suppository 0  . hydrochlorothiazide (MICROZIDE) 12.5 MG capsule Take 2 capsules (25 mg total) by mouth daily. 60 capsule 5  . isosorbide mononitrate (IMDUR) 60 MG 24 hr tablet Take 2 tablets (120 mg total) by mouth daily. (Patient taking differently: Take 60 mg by mouth 2 (two) times daily. ) 60 tablet 11  . levothyroxine (SYNTHROID, LEVOTHROID) 137 MCG tablet Take 1 tablet (137 mcg total) by mouth daily before breakfast. 30 tablet 2  . morphine (MS CONTIN) 30 MG 12 hr tablet Take 1 tablet (30 mg total) by mouth 2 (two) times daily. 60 tablet 0  . nitroGLYCERIN (NITROSTAT) 0.4 MG SL tablet Place 1 tablet (0.4 mg total) under the tongue every 5 (five) minutes as needed for  chest pain. 30 tablet 0  . omeprazole (PRILOSEC) 40 MG capsule Take 1 capsule (40 mg total) by mouth daily. 30 capsule 3  . polyethylene glycol (MIRALAX / GLYCOLAX) packet Take 17 g by mouth daily as needed for mild constipation.     . senna-docusate (SENOKOT-S) 8.6-50 MG per tablet Take 1 tablet by mouth daily.    . [DISCONTINUED] simvastatin (ZOCOR) 40 MG tablet Take 40 mg by mouth every evening.     No current facility-administered medications for this visit.    Allergies:  Chlorhexidine; Adhesive; Codeine; and Latex    Social History:  The patient  reports that she quit smoking about 26 years ago. Her smoking use included Cigarettes. She has a 20 pack-year smoking history. She has never used smokeless tobacco. She reports that she does not drink alcohol or use illicit drugs.   Family History:  The patient's family history includes Arthritis in an other family member; Breast cancer (age of onset: 50) in her paternal aunt; Cancer in her father and mother; Coronary artery disease in an other family member; Deep vein thrombosis in her mother; Diabetes in her mother; Heart disease in her mother; Hypertension in her mother; Stroke in an other family member. There is no history of Colon cancer or Stomach cancer.    ROS:   As noted in HPI. All other systems are reviewed and are negative.    Wt Readings from Last 3 Encounters:  07/10/14 88.225 kg (194 lb 8 oz)  07/08/14 88.587 kg (195 lb 4.8 oz)  06/10/14 87.091 kg (192 lb)     PHYSICAL EXAM: VS:  BP 128/38 mmHg  Pulse 71  Ht 5' 2.5" (1.588 m)  Wt 88.225 kg (194 lb 8 oz)  BMI 34.99 kg/m2  LMP 03/13/2012 , BMI Body mass index is 34.99 kg/(m^2). General:Pleasant affect, NAD Skin:Warm and dry, brisk capillary refill HEENT:normocephalic, sclera clear, mucus membranes moist Neck:supple, no JVD, no bruits  Heart:S1S2 RRR without murmur, gallup, rub or click Lungs:clear without rales, rhonchi, or wheezes UUV:OZDG, non tender, + BS, do not  palpate liver spleen or masses Ext:tr to 1 + lower ext edema, 2+ pedal pulses, 2+ radial pulses Neuro:alert and oriented X 3, MAE, follows commands, + facial symmetry. Walks with a cane    EKG:  none   Recent Labs: 06/10/2014: TSH 11.47* 07/08/2014: ALT 8; BUN 31.3*; Creatinine 1.2*; HGB 8.4*; Platelets 204; Potassium 4.3; Sodium 139    Lipid Panel    Component Value Date/Time   CHOL 134 03/02/2014 1158   TRIG 151.0* 03/02/2014 1158   HDL 27.70* 03/02/2014 1158   CHOLHDL 5 03/02/2014 1158   VLDL 30.2 03/02/2014 1158   LDLCALC 76 03/02/2014 1158       Other studies Reviewed: Additional studies/ records that were reviewed today include: previous notes.   ASSESSMENT AND PLAN:  1. Coronary artery disease with stable angina. Class 1-2. She has known CTO of the RCA with collaterals and 75% ostial LCx disease. This would require a high risk PCI to treat. Favor continued medical therapy especially since she is doing so well and since she has progressive breast CA.   2. Hypertension controlled  3. Edema- suspect related to Actos. Patient reports it is improving.   4. Hyperlipidemia stable on Lipitor 80  5. Carotid arterial disease. S/p right CEA and left ICA stent. Dopplers in April looked good.    Current medicines are reviewed with the patient today.  The patient Has no concerns regarding medicines.  The following changes have been made:  See above Labs/ tests ordered today include:see above  Disposition:   FU:  6 months.  Signed, Ladesha Pacini [place], MD [facility] 07/10/2014 1:07 PM    [facility] [place], [place], [place] Phone: [phone]; Fax: [phone]

## 2014-07-13 ENCOUNTER — Ambulatory Visit (INDEPENDENT_AMBULATORY_CARE_PROVIDER_SITE_OTHER): Payer: Medicare Other | Admitting: Family Medicine

## 2014-07-13 ENCOUNTER — Encounter: Payer: Self-pay | Admitting: Family Medicine

## 2014-07-13 VITALS — BP 130/60 | HR 68 | Temp 98.6°F | Resp 16 | Wt 194.0 lb

## 2014-07-13 DIAGNOSIS — T50905A Adverse effect of unspecified drugs, medicaments and biological substances, initial encounter: Secondary | ICD-10-CM

## 2014-07-13 DIAGNOSIS — R609 Edema, unspecified: Secondary | ICD-10-CM

## 2014-07-13 DIAGNOSIS — T887XXA Unspecified adverse effect of drug or medicament, initial encounter: Secondary | ICD-10-CM | POA: Diagnosis not present

## 2014-07-13 DIAGNOSIS — E119 Type 2 diabetes mellitus without complications: Secondary | ICD-10-CM

## 2014-07-13 MED ORDER — INSULIN ISOPHANE & REGULAR (HUMAN 70-30)100 UNIT/ML KWIKPEN: PEN_INJECTOR | SUBCUTANEOUS | Status: DC

## 2014-07-13 NOTE — Progress Notes (Signed)
OFFICE NOTE  07/13/2014  CC:  Chief Complaint  Patient presents with  . Foot Swelling     HPI: Patient is a 74 y.o. Caucasian female who is here for c/o LE swelling. Onset after getting on actos at the $Remo'30mg'vHjaV$  daily dosing.  We had her d/c this med for now. She says the swelling has just recently started going down.   Glucoses: "around the 200 range".  occ near 300.  She is currently on no diab meds at this time. In the past she has been on lantus.  Last visit 1 mo ago her TSH was improved but we still had to increase her synthroid dosing some. A1c was up a bit, thus the increase in actos from $RemoveBe'15mg'EwmkuGfBm$  to $R'30mg'NR$  daily.  Cardiology f/u 07/10/14: all good/stable.  Recent hem/onc f/u: bone mets getting a bit worse, therapies switched to exemestane and everolimus. Also, her aranesp was increased to q2 weeks instead of q month.  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   The current med regimen she is on currently is not causing nausea.     Pertinent PMH:  Past medical, surgical, social, and family history reviewed and no changes are noted since last office visit.  MEDS:  Outpatient Prescriptions Prior to Visit  Medication Sig Dispense Refill  . amLODipine (NORVASC) 10 MG tablet TAKE ONE TABLET BY MOUTH EVERY DAY 30 tablet 6  . aspirin 81 MG chewable tablet Chew 81 mg by mouth daily.    Marland Kitchen atorvastatin (LIPITOR) 80 MG tablet Take 1 tablet (80 mg total) by mouth daily. 30 tablet 1  . Blood Glucose Monitoring Suppl (ACCU-CHEK NANO SMARTVIEW) W/DEVICE KIT Use to check blood sugar twice a day. DX 250.00 1 kit 0  . carvedilol (COREG) 25 MG tablet Take 1 tablet (25 mg total) by mouth 2 (two) times daily with a meal. 60 tablet 6  . clopidogrel (PLAVIX) 75 MG tablet Take 1 tablet (75 mg total) by mouth daily. 30 tablet 8  . cyanocobalamin (,VITAMIN B-12,) 1000 MCG/ML injection Inject  1,000 mcg into the muscle every 30 (thirty) days.     . darbepoetin (ARANESP) 200 MCG/0.4ML SOLN Inject 200 mcg into the skin once. Every 30 days    . everolimus (AFINITOR) 5 MG tablet Take 1 tablet (5 mg total) by mouth daily. 30 tablet 6  . exemestane (AROMASIN) 25 MG tablet Take 1 tablet (25 mg total) by mouth daily after breakfast. 30 tablet 2  . gabapentin (NEURONTIN) 100 MG capsule Take 1 capsule (100 mg total) by mouth 3 (three) times daily. 90 capsule 2  . glucose blood (ACCU-CHEK AVIVA) test strip Use as instructed to check blood sugar twice a day  Dx 250.00 100 each 3  . glycerin adult (GLYCERIN ADULT) 2 G SUPP Place 1 suppository rectally once as needed (constipation). 10 suppository 0  . hydrochlorothiazide (MICROZIDE) 12.5 MG capsule Take 2 capsules (25 mg total) by mouth daily. 60 capsule 5  . isosorbide mononitrate (IMDUR) 60 MG 24 hr tablet Take 2 tablets (120 mg total) by mouth daily. (Patient taking differently: Take 60 mg by mouth 2 (two) times daily. ) 60 tablet 11  . levothyroxine (SYNTHROID, LEVOTHROID) 137 MCG tablet Take 1 tablet (137 mcg total) by mouth daily before breakfast. 30 tablet 2  . morphine (MS CONTIN) 30 MG 12 hr tablet Take 1 tablet (30 mg  total) by mouth 2 (two) times daily. 60 tablet 0  . nitroGLYCERIN (NITROSTAT) 0.4 MG SL tablet Place 1 tablet (0.4 mg total) under the tongue every 5 (five) minutes as needed for chest pain. 30 tablet 0  . omeprazole (PRILOSEC) 40 MG capsule Take 1 capsule (40 mg total) by mouth daily. 30 capsule 3  . polyethylene glycol (MIRALAX / GLYCOLAX) packet Take 17 g by mouth daily as needed for mild constipation.     . senna-docusate (SENOKOT-S) 8.6-50 MG per tablet Take 1 tablet by mouth daily.     No facility-administered medications prior to visit.    PE: Blood pressure 130/60, pulse 68, temperature 98.6 F (37 C), temperature source Oral, resp. rate 16, weight 194 lb (87.998 kg), last menstrual period 03/13/2012, SpO2 98  %. Gen: Alert, well appearing.  Patient is oriented to person, place, time, and situation. LEGS: 1+ pitting edema from below the knees down into feet bilat.  No rash or warmth or tenderness.  Lab Results  Component Value Date   HGBA1C 7.9* 06/10/2014    IMPRESSION AND PLAN:  1) DM 2, glucoses much higher since being off meds altogether lately. Given CRI at baseline, plus potential for further renal damage from her adjuvant chemo, will avoid all diabetic meds except insulin at this time. Start humulin 70/30 at 0.3 mg/kg.  Start 20 U qAM and 10 U qPM--with plan to up-titrate in future as needed.  Continue bid glucose checks.  2) LE edema: secondary to pioglitazone at $RemoveBeforeD'30mg'rQpzhureTCkJQD$  qd dosing. She is off this med and edema is going away.  Decision making of moderate complexity was used for today's visit.  An After Visit Summary was printed and given to the patient.  FOLLOW UP: 4 wks

## 2014-07-13 NOTE — Progress Notes (Signed)
Pre visit review using our clinic review tool, if applicable. No additional management support is needed unless otherwise documented below in the visit note. 

## 2014-07-14 ENCOUNTER — Ambulatory Visit (HOSPITAL_BASED_OUTPATIENT_CLINIC_OR_DEPARTMENT_OTHER): Payer: Medicare Other

## 2014-07-14 ENCOUNTER — Other Ambulatory Visit (HOSPITAL_BASED_OUTPATIENT_CLINIC_OR_DEPARTMENT_OTHER): Payer: Medicare Other

## 2014-07-14 VITALS — BP 146/56 | HR 72 | Temp 98.3°F

## 2014-07-14 DIAGNOSIS — C50911 Malignant neoplasm of unspecified site of right female breast: Secondary | ICD-10-CM

## 2014-07-14 DIAGNOSIS — C7951 Secondary malignant neoplasm of bone: Secondary | ICD-10-CM | POA: Diagnosis not present

## 2014-07-14 DIAGNOSIS — C50919 Malignant neoplasm of unspecified site of unspecified female breast: Secondary | ICD-10-CM | POA: Diagnosis not present

## 2014-07-14 DIAGNOSIS — D631 Anemia in chronic kidney disease: Secondary | ICD-10-CM

## 2014-07-14 DIAGNOSIS — N189 Chronic kidney disease, unspecified: Secondary | ICD-10-CM

## 2014-07-14 LAB — COMPREHENSIVE METABOLIC PANEL (CC13)
ALBUMIN: 3 g/dL — AB (ref 3.5–5.0)
ALK PHOS: 107 U/L (ref 40–150)
ALT: 10 U/L (ref 0–55)
AST: 12 U/L (ref 5–34)
Anion Gap: 13 mEq/L — ABNORMAL HIGH (ref 3–11)
BILIRUBIN TOTAL: 0.23 mg/dL (ref 0.20–1.20)
BUN: 27 mg/dL — ABNORMAL HIGH (ref 7.0–26.0)
CALCIUM: 8.2 mg/dL — AB (ref 8.4–10.4)
CO2: 21 mEq/L — ABNORMAL LOW (ref 22–29)
Chloride: 105 mEq/L (ref 98–109)
Creatinine: 1.2 mg/dL — ABNORMAL HIGH (ref 0.6–1.1)
EGFR: 45 mL/min/{1.73_m2} — AB (ref >90)
GLUCOSE: 322 mg/dL — AB (ref 70–140)
POTASSIUM: 4.4 meq/L (ref 3.5–5.1)
SODIUM: 138 meq/L (ref 136–145)
TOTAL PROTEIN: 6.9 g/dL (ref 6.4–8.3)

## 2014-07-14 LAB — CBC WITH DIFFERENTIAL/PLATELET
BASO%: 0.3 % (ref 0.0–2.0)
Basophils Absolute: 0 10*3/uL (ref 0.0–0.1)
EOS%: 3.6 % (ref 0.0–7.0)
Eosinophils Absolute: 0.1 10*3/uL (ref 0.0–0.5)
HCT: 26.5 % — ABNORMAL LOW (ref 34.8–46.6)
HGB: 8.2 g/dL — ABNORMAL LOW (ref 11.6–15.9)
LYMPH%: 32.6 % (ref 14.0–49.7)
MCH: 29.3 pg (ref 25.1–34.0)
MCHC: 30.9 g/dL — ABNORMAL LOW (ref 31.5–36.0)
MCV: 94.6 fL (ref 79.5–101.0)
MONO#: 0.2 10*3/uL (ref 0.1–0.9)
MONO%: 5.7 % (ref 0.0–14.0)
NEUT#: 2.2 10*3/uL (ref 1.5–6.5)
NEUT%: 57.8 % (ref 38.4–76.8)
Platelets: 170 10*3/uL (ref 145–400)
RBC: 2.8 10*6/uL — ABNORMAL LOW (ref 3.70–5.45)
RDW: 16.5 % — ABNORMAL HIGH (ref 11.2–14.5)
WBC: 3.8 10*3/uL — ABNORMAL LOW (ref 3.9–10.3)
lymph#: 1.3 10*3/uL (ref 0.9–3.3)

## 2014-07-14 LAB — CANCER ANTIGEN 27.29: CA 27.29: 142 U/mL — ABNORMAL HIGH (ref 0–39)

## 2014-07-14 LAB — FERRITIN CHCC: Ferritin: 336 ng/ml — ABNORMAL HIGH (ref 9–269)

## 2014-07-14 MED ORDER — DARBEPOETIN ALFA 200 MCG/0.4ML IJ SOSY
200.0000 ug | PREFILLED_SYRINGE | Freq: Once | INTRAMUSCULAR | Status: AC
  Administered 2014-07-14: 200 ug via SUBCUTANEOUS
  Filled 2014-07-14: qty 0.4

## 2014-07-14 MED ORDER — DENOSUMAB 120 MG/1.7ML ~~LOC~~ SOLN
120.0000 mg | Freq: Once | SUBCUTANEOUS | Status: AC
  Administered 2014-07-14: 120 mg via SUBCUTANEOUS
  Filled 2014-07-14: qty 1.7

## 2014-07-14 NOTE — Patient Instructions (Signed)
Denosumab injection What is this medicine? DENOSUMAB (den oh sue mab) slows bone breakdown. Prolia is used to treat osteoporosis in women after menopause and in men. Xgeva is used to prevent bone fractures and other bone problems caused by cancer bone metastases. Xgeva is also used to treat giant cell tumor of the bone. This medicine may be used for other purposes; ask your health care provider or pharmacist if you have questions. COMMON BRAND NAME(S): Prolia, XGEVA What should I tell my health care provider before I take this medicine? They need to know if you have any of these conditions: -dental disease -eczema -infection or history of infections -kidney disease or on dialysis -low blood calcium or vitamin D -malabsorption syndrome -scheduled to have surgery or tooth extraction -taking medicine that contains denosumab -thyroid or parathyroid disease -an unusual reaction to denosumab, other medicines, foods, dyes, or preservatives -pregnant or trying to get pregnant -breast-feeding How should I use this medicine? This medicine is for injection under the skin. It is given by a health care professional in a hospital or clinic setting. If you are getting Prolia, a special MedGuide will be given to you by the pharmacist with each prescription and refill. Be sure to read this information carefully each time. For Prolia, talk to your pediatrician regarding the use of this medicine in children. Special care may be needed. For Xgeva, talk to your pediatrician regarding the use of this medicine in children. While this drug may be prescribed for children as young as 13 years for selected conditions, precautions do apply. Overdosage: If you think you've taken too much of this medicine contact a poison control center or emergency room at once. Overdosage: If you think you have taken too much of this medicine contact a poison control center or emergency room at once. NOTE: This medicine is only for  you. Do not share this medicine with others. What if I miss a dose? It is important not to miss your dose. Call your doctor or health care professional if you are unable to keep an appointment. What may interact with this medicine? Do not take this medicine with any of the following medications: -other medicines containing denosumab This medicine may also interact with the following medications: -medicines that suppress the immune system -medicines that treat cancer -steroid medicines like prednisone or cortisone This list may not describe all possible interactions. Give your health care provider a list of all the medicines, herbs, non-prescription drugs, or dietary supplements you use. Also tell them if you smoke, drink alcohol, or use illegal drugs. Some items may interact with your medicine. What should I watch for while using this medicine? Visit your doctor or health care professional for regular checks on your progress. Your doctor or health care professional may order blood tests and other tests to see how you are doing. Call your doctor or health care professional if you get a cold or other infection while receiving this medicine. Do not treat yourself. This medicine may decrease your body's ability to fight infection. You should make sure you get enough calcium and vitamin D while you are taking this medicine, unless your doctor tells you not to. Discuss the foods you eat and the vitamins you take with your health care professional. See your dentist regularly. Brush and floss your teeth as directed. Before you have any dental work done, tell your dentist you are receiving this medicine. Do not become pregnant while taking this medicine or for 5 months after stopping   it. Women should inform their doctor if they wish to become pregnant or think they might be pregnant. There is a potential for serious side effects to an unborn child. Talk to your health care professional or pharmacist for more  information. What side effects may I notice from receiving this medicine? Side effects that you should report to your doctor or health care professional as soon as possible: -allergic reactions like skin rash, itching or hives, swelling of the face, lips, or tongue -breathing problems -chest pain -fast, irregular heartbeat -feeling faint or lightheaded, falls -fever, chills, or any other sign of infection -muscle spasms, tightening, or twitches -numbness or tingling -skin blisters or bumps, or is dry, peels, or red -slow healing or unexplained pain in the mouth or jaw -unusual bleeding or bruising Side effects that usually do not require medical attention (Report these to your doctor or health care professional if they continue or are bothersome.): -muscle pain -stomach upset, gas This list may not describe all possible side effects. Call your doctor for medical advice about side effects. You may report side effects to FDA at 1-800-FDA-1088. Where should I keep my medicine? This medicine is only given in a clinic, doctor's office, or other health care setting and will not be stored at home. NOTE: This sheet is a summary. It may not cover all possible information. If you have questions about this medicine, talk to your doctor, pharmacist, or health care provider.  2015, Elsevier/Gold Standard. (2011-07-17 12:37:47)  

## 2014-07-15 ENCOUNTER — Encounter (HOSPITAL_COMMUNITY): Payer: Self-pay

## 2014-07-15 ENCOUNTER — Other Ambulatory Visit: Payer: Self-pay | Admitting: Oncology

## 2014-07-15 ENCOUNTER — Ambulatory Visit (HOSPITAL_COMMUNITY)
Admission: RE | Admit: 2014-07-15 | Discharge: 2014-07-15 | Disposition: A | Discharge status: Home / Self Care | Payer: Medicare Other | Source: Ambulatory Visit | Attending: Oncology | Admitting: Oncology

## 2014-07-15 ENCOUNTER — Encounter: Payer: Self-pay | Admitting: Oncology

## 2014-07-15 DIAGNOSIS — N189 Chronic kidney disease, unspecified: Secondary | ICD-10-CM

## 2014-07-15 DIAGNOSIS — C50919 Malignant neoplasm of unspecified site of unspecified female breast: Secondary | ICD-10-CM | POA: Diagnosis present

## 2014-07-15 DIAGNOSIS — Z923 Personal history of irradiation: Secondary | ICD-10-CM | POA: Diagnosis not present

## 2014-07-15 DIAGNOSIS — R911 Solitary pulmonary nodule: Secondary | ICD-10-CM | POA: Insufficient documentation

## 2014-07-15 DIAGNOSIS — C7951 Secondary malignant neoplasm of bone: Secondary | ICD-10-CM | POA: Diagnosis not present

## 2014-07-15 DIAGNOSIS — Z9011 Acquired absence of right breast and nipple: Secondary | ICD-10-CM | POA: Insufficient documentation

## 2014-07-15 DIAGNOSIS — D631 Anemia in chronic kidney disease: Secondary | ICD-10-CM

## 2014-07-15 DIAGNOSIS — C50911 Malignant neoplasm of unspecified site of right female breast: Secondary | ICD-10-CM

## 2014-07-15 MED ORDER — IOHEXOL 300 MG/ML  SOLN
100.0000 mL | Freq: Once | INTRAMUSCULAR | Status: AC | PRN
  Administered 2014-07-15: 80 mL via INTRAVENOUS

## 2014-07-15 NOTE — Progress Notes (Signed)
Per sl[facility]ane at [facility] the patients meds were delivered 07/06/14. See prev notes.

## 2014-07-16 ENCOUNTER — Other Ambulatory Visit: Payer: Self-pay | Admitting: Oncology

## 2014-07-16 NOTE — Progress Notes (Unsigned)
I called Brittney Cunningham to let her know the results of her chest CT obtained 07/15/2014. Basically this is stable. There is a possibly new 5 mm nodule at the right lung base. This may have been missed before because of registration). This will need to be followed. We are clearly not going to change her current treatment on this basis.  She really has follow-up appointment with thousand July.

## 2014-07-17 ENCOUNTER — Telehealth: Payer: Self-pay | Admitting: Nurse Practitioner

## 2014-07-17 NOTE — Telephone Encounter (Signed)
Eat dinner. Check BS 1 hr later. If over 120, take 5u insulin tonight. Start 10 U bid tomorrow. Call if FBS over 150 under 70 or pps is over 225 under 70.

## 2014-07-20 ENCOUNTER — Other Ambulatory Visit: Payer: Self-pay | Admitting: *Deleted

## 2014-07-20 MED ORDER — ATORVASTATIN CALCIUM 80 MG PO TABS: 80.0000 mg | ORAL_TABLET | Freq: Every day | ORAL | Status: AC

## 2014-07-24 NOTE — Telephone Encounter (Signed)
[facility] sent facsimile confirmation of Afinitor prescription shipment.  Afinitor was shipped on 07-23-2014 with next business day delivery.

## 2014-07-27 ENCOUNTER — Encounter: Payer: Self-pay | Admitting: Nurse Practitioner

## 2014-07-27 ENCOUNTER — Ambulatory Visit (HOSPITAL_BASED_OUTPATIENT_CLINIC_OR_DEPARTMENT_OTHER)
Admission: RE | Admit: 2014-07-27 | Discharge: 2014-07-27 | Disposition: A | Discharge status: Home / Self Care | Payer: Medicare Other | Source: Ambulatory Visit | Attending: Nurse Practitioner | Admitting: Nurse Practitioner

## 2014-07-27 ENCOUNTER — Other Ambulatory Visit: Payer: Self-pay

## 2014-07-27 ENCOUNTER — Ambulatory Visit: Payer: Self-pay

## 2014-07-27 ENCOUNTER — Ambulatory Visit (INDEPENDENT_AMBULATORY_CARE_PROVIDER_SITE_OTHER): Payer: Medicare Other | Admitting: Nurse Practitioner

## 2014-07-27 ENCOUNTER — Telehealth: Payer: Self-pay | Admitting: Nurse Practitioner

## 2014-07-27 VITALS — BP 145/68 | HR 72 | Temp 98.1°F | Resp 18 | Ht 63.0 in | Wt 196.0 lb

## 2014-07-27 DIAGNOSIS — M79672 Pain in left foot: Secondary | ICD-10-CM | POA: Diagnosis present

## 2014-07-27 DIAGNOSIS — S99922A Unspecified injury of left foot, initial encounter: Secondary | ICD-10-CM

## 2014-07-27 DIAGNOSIS — R600 Localized edema: Secondary | ICD-10-CM

## 2014-07-27 DIAGNOSIS — R2242 Localized swelling, mass and lump, left lower limb: Secondary | ICD-10-CM | POA: Insufficient documentation

## 2014-07-27 NOTE — Patient Instructions (Signed)
Wear post-op shoe when up & about.  Please get xray. I will call with result & follow up.  Very nice to see you!

## 2014-07-27 NOTE — Progress Notes (Signed)
Subjective:    Brittney Cunningham is a 74 y.o. female who presents with left foot pain. Onset of the symptoms was 1 week ago. Precipitating event: injured dropped heavy dish of food on foot. Current symptoms include: ability to bear weight, but with some pain, pain at the top of foot extending to distal MT 2-4 aspect of the ankle, redness and swelling. Aggravating factors: any weight bearing. Symptoms have stabilized. Patient has had no prior foot problems. Evaluation to date: none. Treatment to date: none. Ms Agent has underlying peripheral edema in bilat LE. Actos recently stopped & insulin started. She says edema has not improved.  The following portions of the patient's history were reviewed and updated as appropriate: allergies, current medications, past medical history, past social history, past surgical history and problem list.  Review of Systems Pertinent items are noted in HPI.    Objective:    BP 145/68 mmHg  Pulse 72  Temp(Src) 98.1 F (36.7 C) (Oral)  Resp 18  Ht 5\' 3"  (1.6 m)  Wt 196 lb (88.905 kg)  BMI 34.73 kg/m2  SpO2 96%  LMP 03/13/2012 Right foot:  diffuse +2 edema  Left foot:  soft tissue swelling noted over the top of foot & all toes, ecchymoses and swelling of the 2nd, 3rd and 4th toe and pain across top of foot & along 2-4 MT & 2nd toe.   Imaging: X-ray of the left foot:pending     Assessment:Plan  1. Injury of left foot, initial encounter Possible fracture - DG Foot Complete Left; Future Post op shoe fitted RICE Wt bearing as tolerated F/u per xray results.  2. Bilateral edema of lower extremity F/u w/ Dr Anitra Lauth

## 2014-07-27 NOTE — Telephone Encounter (Signed)
Patient aware.

## 2014-07-27 NOTE — Telephone Encounter (Signed)
pls call pt: Advise No fracture in foot. Continue to wear post-op shoe when up & about for few weeks. Ice & elevation when able.

## 2014-07-27 NOTE — Progress Notes (Signed)
Pre visit review using our clinic review tool, if applicable. No additional management support is needed unless otherwise documented below in the visit note. 

## 2014-07-28 ENCOUNTER — Telehealth: Payer: Self-pay | Admitting: Oncology

## 2014-07-28 ENCOUNTER — Other Ambulatory Visit: Payer: Self-pay

## 2014-07-28 ENCOUNTER — Ambulatory Visit: Payer: Self-pay

## 2014-07-28 NOTE — Telephone Encounter (Signed)
Returned Advertising account executive. Patient reschedule lab/inj from 6/28 to 06/29 due to hurt foot. Confirmed appointment.

## 2014-07-29 ENCOUNTER — Other Ambulatory Visit: Payer: Self-pay

## 2014-07-29 ENCOUNTER — Other Ambulatory Visit (HOSPITAL_BASED_OUTPATIENT_CLINIC_OR_DEPARTMENT_OTHER): Payer: Medicare Other

## 2014-07-29 ENCOUNTER — Ambulatory Visit: Payer: Self-pay | Admitting: Nurse Practitioner

## 2014-07-29 ENCOUNTER — Ambulatory Visit (HOSPITAL_BASED_OUTPATIENT_CLINIC_OR_DEPARTMENT_OTHER): Payer: Medicare Other

## 2014-07-29 ENCOUNTER — Ambulatory Visit (HOSPITAL_COMMUNITY)
Admission: RE | Admit: 2014-07-29 | Discharge: 2014-07-29 | Disposition: A | Discharge status: Home / Self Care | Payer: Medicare Other | Source: Ambulatory Visit | Attending: Oncology | Admitting: Oncology

## 2014-07-29 ENCOUNTER — Ambulatory Visit: Payer: Medicare Other

## 2014-07-29 ENCOUNTER — Ambulatory Visit: Payer: Self-pay | Admitting: Family Medicine

## 2014-07-29 VITALS — BP 138/45 | HR 73 | Temp 98.5°F

## 2014-07-29 DIAGNOSIS — C7952 Secondary malignant neoplasm of bone marrow: Secondary | ICD-10-CM

## 2014-07-29 DIAGNOSIS — N189 Chronic kidney disease, unspecified: Secondary | ICD-10-CM

## 2014-07-29 DIAGNOSIS — C50911 Malignant neoplasm of unspecified site of right female breast: Secondary | ICD-10-CM | POA: Insufficient documentation

## 2014-07-29 DIAGNOSIS — K219 Gastro-esophageal reflux disease without esophagitis: Secondary | ICD-10-CM

## 2014-07-29 DIAGNOSIS — D649 Anemia, unspecified: Secondary | ICD-10-CM | POA: Diagnosis present

## 2014-07-29 DIAGNOSIS — D631 Anemia in chronic kidney disease: Secondary | ICD-10-CM

## 2014-07-29 DIAGNOSIS — C7951 Secondary malignant neoplasm of bone: Secondary | ICD-10-CM

## 2014-07-29 DIAGNOSIS — C50919 Malignant neoplasm of unspecified site of unspecified female breast: Secondary | ICD-10-CM

## 2014-07-29 DIAGNOSIS — E538 Deficiency of other specified B group vitamins: Secondary | ICD-10-CM

## 2014-07-29 LAB — CBC WITH DIFFERENTIAL/PLATELET
BASO%: 0.2 % (ref 0.0–2.0)
Basophils Absolute: 0 10*3/uL (ref 0.0–0.1)
EOS%: 2.6 % (ref 0.0–7.0)
Eosinophils Absolute: 0.1 10*3/uL (ref 0.0–0.5)
HCT: 24.6 % — ABNORMAL LOW (ref 34.8–46.6)
HEMOGLOBIN: 7.8 g/dL — AB (ref 11.6–15.9)
LYMPH%: 34.2 % (ref 14.0–49.7)
MCH: 28.7 pg (ref 25.1–34.0)
MCHC: 31.7 g/dL (ref 31.5–36.0)
MCV: 90.4 fL (ref 79.5–101.0)
MONO#: 0.4 10*3/uL (ref 0.1–0.9)
MONO%: 9 % (ref 0.0–14.0)
NEUT#: 2.3 10*3/uL (ref 1.5–6.5)
NEUT%: 54 % (ref 38.4–76.8)
Platelets: 187 10*3/uL (ref 145–400)
RBC: 2.72 10*6/uL — ABNORMAL LOW (ref 3.70–5.45)
RDW: 15.4 % — AB (ref 11.2–14.5)
WBC: 4.2 10*3/uL (ref 3.9–10.3)
lymph#: 1.5 10*3/uL (ref 0.9–3.3)

## 2014-07-29 LAB — COMPREHENSIVE METABOLIC PANEL (CC13)
ALK PHOS: 109 U/L (ref 40–150)
ALT: 10 U/L (ref 0–55)
ANION GAP: 13 meq/L — AB (ref 3–11)
AST: 15 U/L (ref 5–34)
Albumin: 2.9 g/dL — ABNORMAL LOW (ref 3.5–5.0)
BUN: 20.4 mg/dL (ref 7.0–26.0)
CO2: 23 mEq/L (ref 22–29)
Calcium: 6.6 mg/dL — ABNORMAL LOW (ref 8.4–10.4)
Chloride: 104 mEq/L (ref 98–109)
Creatinine: 1.1 mg/dL (ref 0.6–1.1)
EGFR: 50 mL/min/{1.73_m2} — AB (ref >90)
GLUCOSE: 215 mg/dL — AB (ref 70–140)
POTASSIUM: 3.4 meq/L — AB (ref 3.5–5.1)
SODIUM: 140 meq/L (ref 136–145)
TOTAL PROTEIN: 6.7 g/dL (ref 6.4–8.3)
Total Bilirubin: 0.44 mg/dL (ref 0.20–1.20)

## 2014-07-29 LAB — HOLD TUBE, BLOOD BANK

## 2014-07-29 MED ORDER — DARBEPOETIN ALFA 200 MCG/0.4ML IJ SOSY
200.0000 ug | PREFILLED_SYRINGE | Freq: Once | INTRAMUSCULAR | Status: AC
  Administered 2014-07-29: 200 ug via SUBCUTANEOUS
  Filled 2014-07-29: qty 0.4

## 2014-07-29 MED ORDER — CYANOCOBALAMIN 1000 MCG/ML IJ SOLN
1000.0000 ug | Freq: Once | INTRAMUSCULAR | Status: AC
  Administered 2014-07-29: 1000 ug via INTRAMUSCULAR

## 2014-07-29 NOTE — Patient Instructions (Addendum)
Cyanocobalamin, Vitamin B12 injection What is this medicine? CYANOCOBALAMIN (sye an oh koe BAL a min) is a man made form of vitamin B12. Vitamin B12 is used in the growth of healthy blood cells, nerve cells, and proteins in the body. It also helps with the metabolism of fats and carbohydrates. This medicine is used to treat people who can not absorb vitamin B12. This medicine may be used for other purposes; ask your health care provider or pharmacist if you have questions. COMMON BRAND NAME(S): Cyomin, LA-12, Nutri-Twelve, Primabalt What should I tell my health care provider before I take this medicine? They need to know if you have any of these conditions: -kidney disease -Leber's disease -megaloblastic anemia -an unusual or allergic reaction to cyanocobalamin, cobalt, other medicines, foods, dyes, or preservatives -pregnant or trying to get pregnant -breast-feeding How should I use this medicine? This medicine is injected into a muscle or deeply under the skin. It is usually given by a health care professional in a clinic or doctor's office. However, your doctor may teach you how to inject yourself. Follow all instructions. Talk to your pediatrician regarding the use of this medicine in children. Special care may be needed. Overdosage: If you think you have taken too much of this medicine contact a poison control center or emergency room at once. NOTE: This medicine is only for you. Do not share this medicine with others. What if I miss a dose? If you are given your dose at a clinic or doctor's office, call to reschedule your appointment. If you give your own injections and you miss a dose, take it as soon as you can. If it is almost time for your next dose, take only that dose. Do not take double or extra doses. What may interact with this medicine? -colchicine -heavy alcohol intake This list may not describe all possible interactions. Give your health care provider a list of all the  medicines, herbs, non-prescription drugs, or dietary supplements you use. Also tell them if you smoke, drink alcohol, or use illegal drugs. Some items may interact with your medicine. What should I watch for while using this medicine? Visit your doctor or health care professional regularly. You may need blood work done while you are taking this medicine. You may need to follow a special diet. Talk to your doctor. Limit your alcohol intake and avoid smoking to get the best benefit. What side effects may I notice from receiving this medicine? Side effects that you should report to your doctor or health care professional as soon as possible: -allergic reactions like skin rash, itching or hives, swelling of the face, lips, or tongue -blue tint to skin -chest tightness, pain -difficulty breathing, wheezing -dizziness -red, swollen painful area on the leg Side effects that usually do not require medical attention (report to your doctor or health care professional if they continue or are bothersome): -diarrhea -headache This list may not describe all possible side effects. Call your doctor for medical advice about side effects. You may report side effects to FDA at 1-800-FDA-1088. Where should I keep my medicine? Keep out of the reach of children. Store at room temperature between 15 and 30 degrees C (59 and 85 degrees F). Protect from light. Throw away any unused medicine after the expiration date. NOTE: This sheet is a summary. It may not cover all possible information. If you have questions about this medicine, talk to your doctor, pharmacist, or health care provider.  2015, Elsevier/Gold Standard. (2007-04-29 22:10:20) Darbepoetin   Alfa injection What is this medicine? DARBEPOETIN ALFA (dar be POE e tin AL fa) helps your body make more red blood cells. It is used to treat anemia caused by chronic kidney failure and chemotherapy. This medicine may be used for other purposes; ask your health care  provider or pharmacist if you have questions. COMMON BRAND NAME(S): Aranesp What should I tell my health care provider before I take this medicine? They need to know if you have any of these conditions: -blood clotting disorders or history of blood clots -cancer patient not on chemotherapy -cystic fibrosis -heart disease, such as angina, heart failure, or a history of a heart attack -hemoglobin level of 12 g/dL or greater -high blood pressure -low levels of folate, iron, or vitamin B12 -seizures -an unusual or allergic reaction to darbepoetin, erythropoietin, albumin, hamster proteins, latex, other medicines, foods, dyes, or preservatives -pregnant or trying to get pregnant -breast-feeding How should I use this medicine? This medicine is for injection into a vein or under the skin. It is usually given by a health care professional in a hospital or clinic setting. If you get this medicine at home, you will be taught how to prepare and give this medicine. Do not shake the solution before you withdraw a dose. Use exactly as directed. Take your medicine at regular intervals. Do not take your medicine more often than directed. It is important that you put your used needles and syringes in a special sharps container. Do not put them in a trash can. If you do not have a sharps container, call your pharmacist or healthcare provider to get one. Talk to your pediatrician regarding the use of this medicine in children. While this medicine may be used in children as young as 1 year for selected conditions, precautions do apply. Overdosage: If you think you have taken too much of this medicine contact a poison control center or emergency room at once. NOTE: This medicine is only for you. Do not share this medicine with others. What if I miss a dose? If you miss a dose, take it as soon as you can. If it is almost time for your next dose, take only that dose. Do not take double or extra doses. What may  interact with this medicine? Do not take this medicine with any of the following medications: -epoetin alfa This list may not describe all possible interactions. Give your health care provider a list of all the medicines, herbs, non-prescription drugs, or dietary supplements you use. Also tell them if you smoke, drink alcohol, or use illegal drugs. Some items may interact with your medicine. What should I watch for while using this medicine? Visit your prescriber or health care professional for regular checks on your progress and for the needed blood tests and blood pressure measurements. It is especially important for the doctor to make sure your hemoglobin level is in the desired range, to limit the risk of potential side effects and to give you the best benefit. Keep all appointments for any recommended tests. Check your blood pressure as directed. Ask your doctor what your blood pressure should be and when you should contact him or her. As your body makes more red blood cells, you may need to take iron, folic acid, or vitamin B supplements. Ask your doctor or health care provider which products are right for you. If you have kidney disease continue dietary restrictions, even though this medication can make you feel better. Talk with your doctor or health   care professional about the foods you eat and the vitamins that you take. What side effects may I notice from receiving this medicine? Side effects that you should report to your doctor or health care professional as soon as possible: -allergic reactions like skin rash, itching or hives, swelling of the face, lips, or tongue -breathing problems -changes in vision -chest pain -confusion, trouble speaking or understanding -feeling faint or lightheaded, falls -high blood pressure -muscle aches or pains -pain, swelling, warmth in the leg -rapid weight gain -severe headaches -sudden numbness or weakness of the face, arm or leg -trouble walking,  dizziness, loss of balance or coordination -seizures (convulsions) -swelling of the ankles, feet, hands -unusually weak or tired Side effects that usually do not require medical attention (report to your doctor or health care professional if they continue or are bothersome): -diarrhea -fever, chills (flu-like symptoms) -headaches -nausea, vomiting -redness, stinging, or swelling at site where injected This list may not describe all possible side effects. Call your doctor for medical advice about side effects. You may report side effects to FDA at 1-800-FDA-1088. Where should I keep my medicine? Keep out of the reach of children. Store in a refrigerator between 2 and 8 degrees C (36 and 46 degrees F). Do not freeze. Do not shake. Throw away any unused portion if using a single-dose vial. Throw away any unused medicine after the expiration date. NOTE: This sheet is a summary. It may not cover all possible information. If you have questions about this medicine, talk to your doctor, pharmacist, or health care provider.  2015, Elsevier/Gold Standard. (2007-12-31 10:23:57)  

## 2014-07-31 ENCOUNTER — Ambulatory Visit (HOSPITAL_BASED_OUTPATIENT_CLINIC_OR_DEPARTMENT_OTHER): Payer: Medicare Other

## 2014-07-31 ENCOUNTER — Ambulatory Visit (HOSPITAL_COMMUNITY)
Admission: RE | Admit: 2014-07-31 | Discharge: 2014-07-31 | Disposition: A | Discharge status: Home / Self Care | Payer: Medicare Other | Source: Ambulatory Visit | Attending: Oncology | Admitting: Oncology

## 2014-07-31 VITALS — BP 130/57 | HR 72 | Temp 98.4°F | Resp 20

## 2014-07-31 DIAGNOSIS — D649 Anemia, unspecified: Secondary | ICD-10-CM

## 2014-07-31 DIAGNOSIS — Z853 Personal history of malignant neoplasm of breast: Secondary | ICD-10-CM

## 2014-07-31 HISTORY — DX: Hypocalcemia: E83.51

## 2014-07-31 MED ORDER — ACETAMINOPHEN 325 MG PO TABS
ORAL_TABLET | ORAL | Status: AC
  Filled 2014-07-31: qty 2

## 2014-07-31 MED ORDER — DIPHENHYDRAMINE HCL 25 MG PO CAPS
ORAL_CAPSULE | ORAL | Status: AC
  Filled 2014-07-31: qty 1

## 2014-07-31 MED ORDER — ACETAMINOPHEN 325 MG PO TABS
650.0000 mg | ORAL_TABLET | Freq: Once | ORAL | Status: AC
  Administered 2014-07-31: 650 mg via ORAL

## 2014-07-31 MED ORDER — DIPHENHYDRAMINE HCL 25 MG PO CAPS
25.0000 mg | ORAL_CAPSULE | Freq: Once | ORAL | Status: AC
  Administered 2014-07-31: 25 mg via ORAL

## 2014-07-31 MED ORDER — SODIUM CHLORIDE 0.9 % IJ SOLN
10.0000 mL | INTRAMUSCULAR | Status: AC | PRN
  Administered 2014-07-31: 10 mL
  Filled 2014-07-31: qty 10

## 2014-07-31 MED ORDER — HEPARIN SOD (PORK) LOCK FLUSH 100 UNIT/ML IV SOLN
500.0000 [IU] | Freq: Every day | INTRAVENOUS | Status: AC | PRN
  Administered 2014-07-31: 500 [IU]
  Filled 2014-07-31: qty 5

## 2014-07-31 MED ORDER — SODIUM CHLORIDE 0.9 % IV SOLN
250.0000 mL | Freq: Once | INTRAVENOUS | Status: AC
  Administered 2014-07-31: 250 mL via INTRAVENOUS

## 2014-07-31 NOTE — Patient Instructions (Signed)

## 2014-08-01 LAB — TYPE AND SCREEN
ABO/RH(D): O NEG
ANTIBODY SCREEN: NEGATIVE
UNIT DIVISION: 0
Unit division: 0

## 2014-08-02 ENCOUNTER — Other Ambulatory Visit: Payer: Self-pay

## 2014-08-02 ENCOUNTER — Emergency Department (HOSPITAL_COMMUNITY)
Admission: EM | Admit: 2014-08-02 | Discharge: 2014-08-02 | Disposition: A | Discharge status: Home / Self Care | Payer: Medicare Other | Attending: Emergency Medicine | Admitting: Emergency Medicine

## 2014-08-02 ENCOUNTER — Encounter (HOSPITAL_COMMUNITY): Payer: Self-pay | Admitting: Emergency Medicine

## 2014-08-02 DIAGNOSIS — Z91040 Latex allergy status: Secondary | ICD-10-CM | POA: Insufficient documentation

## 2014-08-02 DIAGNOSIS — Z862 Personal history of diseases of the blood and blood-forming organs and certain disorders involving the immune mechanism: Secondary | ICD-10-CM | POA: Insufficient documentation

## 2014-08-02 DIAGNOSIS — Z87891 Personal history of nicotine dependence: Secondary | ICD-10-CM | POA: Insufficient documentation

## 2014-08-02 DIAGNOSIS — I129 Hypertensive chronic kidney disease with stage 1 through stage 4 chronic kidney disease, or unspecified chronic kidney disease: Secondary | ICD-10-CM | POA: Insufficient documentation

## 2014-08-02 DIAGNOSIS — Z794 Long term (current) use of insulin: Secondary | ICD-10-CM | POA: Insufficient documentation

## 2014-08-02 DIAGNOSIS — Z8619 Personal history of other infectious and parasitic diseases: Secondary | ICD-10-CM | POA: Insufficient documentation

## 2014-08-02 DIAGNOSIS — E039 Hypothyroidism, unspecified: Secondary | ICD-10-CM | POA: Diagnosis not present

## 2014-08-02 DIAGNOSIS — Z7902 Long term (current) use of antithrombotics/antiplatelets: Secondary | ICD-10-CM | POA: Diagnosis not present

## 2014-08-02 DIAGNOSIS — Z79899 Other long term (current) drug therapy: Secondary | ICD-10-CM | POA: Diagnosis not present

## 2014-08-02 DIAGNOSIS — Z8669 Personal history of other diseases of the nervous system and sense organs: Secondary | ICD-10-CM | POA: Diagnosis not present

## 2014-08-02 DIAGNOSIS — R531 Weakness: Secondary | ICD-10-CM | POA: Diagnosis not present

## 2014-08-02 DIAGNOSIS — M1990 Unspecified osteoarthritis, unspecified site: Secondary | ICD-10-CM | POA: Insufficient documentation

## 2014-08-02 DIAGNOSIS — I2510 Atherosclerotic heart disease of native coronary artery without angina pectoris: Secondary | ICD-10-CM | POA: Insufficient documentation

## 2014-08-02 DIAGNOSIS — F419 Anxiety disorder, unspecified: Secondary | ICD-10-CM | POA: Insufficient documentation

## 2014-08-02 DIAGNOSIS — Z853 Personal history of malignant neoplasm of breast: Secondary | ICD-10-CM | POA: Diagnosis not present

## 2014-08-02 DIAGNOSIS — N182 Chronic kidney disease, stage 2 (mild): Secondary | ICD-10-CM | POA: Insufficient documentation

## 2014-08-02 DIAGNOSIS — Z87440 Personal history of urinary (tract) infections: Secondary | ICD-10-CM | POA: Diagnosis not present

## 2014-08-02 DIAGNOSIS — E119 Type 2 diabetes mellitus without complications: Secondary | ICD-10-CM | POA: Diagnosis not present

## 2014-08-02 DIAGNOSIS — F329 Major depressive disorder, single episode, unspecified: Secondary | ICD-10-CM | POA: Insufficient documentation

## 2014-08-02 DIAGNOSIS — R1084 Generalized abdominal pain: Secondary | ICD-10-CM | POA: Diagnosis present

## 2014-08-02 DIAGNOSIS — Z7982 Long term (current) use of aspirin: Secondary | ICD-10-CM | POA: Diagnosis not present

## 2014-08-02 DIAGNOSIS — Z940 Kidney transplant status: Secondary | ICD-10-CM | POA: Insufficient documentation

## 2014-08-02 LAB — COMPREHENSIVE METABOLIC PANEL
ALT: 11 U/L — ABNORMAL LOW (ref 14–54)
AST: 19 U/L (ref 15–41)
Albumin: 3 g/dL — ABNORMAL LOW (ref 3.5–5.0)
Alkaline Phosphatase: 103 U/L (ref 38–126)
Anion gap: 13 (ref 5–15)
BUN: 15 mg/dL (ref 6–20)
CALCIUM: 6 mg/dL — AB (ref 8.9–10.3)
CO2: 23 mmol/L (ref 22–32)
CREATININE: 0.88 mg/dL (ref 0.44–1.00)
Chloride: 102 mmol/L (ref 101–111)
GFR calc Af Amer: >60 mL/min (ref >60)
GFR calc non Af Amer: >60 mL/min (ref >60)
Glucose, Bld: 197 mg/dL — ABNORMAL HIGH (ref 65–99)
POTASSIUM: 3 mmol/L — AB (ref 3.5–5.1)
Sodium: 138 mmol/L (ref 135–145)
TOTAL PROTEIN: 7 g/dL (ref 6.5–8.1)
Total Bilirubin: 0.7 mg/dL (ref 0.3–1.2)

## 2014-08-02 LAB — CBC WITH DIFFERENTIAL/PLATELET
BASOS ABS: 0 10*3/uL (ref 0.0–0.1)
BASOS PCT: 0 % (ref 0–1)
EOS PCT: 1 % (ref 0–5)
Eosinophils Absolute: 0.1 10*3/uL (ref 0.0–0.7)
HCT: 33 % — ABNORMAL LOW (ref 36.0–46.0)
Hemoglobin: 10.8 g/dL — ABNORMAL LOW (ref 12.0–15.0)
LYMPHS PCT: 31 % (ref 12–46)
Lymphs Abs: 1.5 10*3/uL (ref 0.7–4.0)
MCH: 29.2 pg (ref 26.0–34.0)
MCHC: 32.7 g/dL (ref 30.0–36.0)
MCV: 89.2 fL (ref 78.0–100.0)
MONOS PCT: 8 % (ref 3–12)
Monocytes Absolute: 0.4 10*3/uL (ref 0.1–1.0)
Neutro Abs: 2.8 10*3/uL (ref 1.7–7.7)
Neutrophils Relative %: 60 % (ref 43–77)
Platelets: 197 10*3/uL (ref 150–400)
RBC: 3.7 MIL/uL — ABNORMAL LOW (ref 3.87–5.11)
RDW: 14.9 % (ref 11.5–15.5)
WBC: 4.7 10*3/uL (ref 4.0–10.5)

## 2014-08-02 LAB — LIPASE, BLOOD: Lipase: 11 U/L — ABNORMAL LOW (ref 22–51)

## 2014-08-02 MED ORDER — SODIUM CHLORIDE 0.9 % IV BOLUS (SEPSIS)
1000.0000 mL | Freq: Once | INTRAVENOUS | Status: AC
  Administered 2014-08-02: 1000 mL via INTRAVENOUS

## 2014-08-02 MED ORDER — HEPARIN SOD (PORK) LOCK FLUSH 100 UNIT/ML IV SOLN
500.0000 [IU] | Freq: Once | INTRAVENOUS | Status: AC
  Administered 2014-08-02: 500 [IU]
  Filled 2014-08-02: qty 5

## 2014-08-02 MED ORDER — ONDANSETRON HCL 4 MG/2ML IJ SOLN
4.0000 mg | Freq: Once | INTRAMUSCULAR | Status: AC
  Administered 2014-08-02: 4 mg via INTRAVENOUS
  Filled 2014-08-02: qty 2

## 2014-08-02 MED ORDER — SODIUM CHLORIDE 0.9 % IV SOLN
1.0000 g | Freq: Once | INTRAVENOUS | Status: AC
  Administered 2014-08-02: 1 g via INTRAVENOUS
  Filled 2014-08-02: qty 10

## 2014-08-02 MED ORDER — CALCIUM CARBONATE ANTACID 500 MG PO CHEW: 1.0000 | CHEWABLE_TABLET | Freq: Three times a day (TID) | ORAL | Status: AC

## 2014-08-02 NOTE — ED Notes (Signed)
Awake. Verbally responsive. Resp even and unlabored. No audible adventitious breath sounds noted. ABC's intact. No N/V/D reported. IV infusing NS at 19ml/hr without difficulty. Family at bedside.

## 2014-08-02 NOTE — ED Notes (Addendum)
Awake. Verbally responsive. Resp even and unlabored. No audible adventitious breath sounds noted. ABC's intact. No N/V/D reported. IV infusing NS at 947ml/hr without difficulty. Family at bedside.

## 2014-08-02 NOTE — ED Notes (Signed)
Nurse drawing labs from port 

## 2014-08-02 NOTE — ED Notes (Addendum)
Lab called and reported Calcium-6.0. Dr. Stark Jock aware.

## 2014-08-02 NOTE — ED Provider Notes (Signed)
CSN: [id number]     Arrival date & time 08/02/14  1154 History   First MD Initiated Contact with Patient 08/02/14 1159     Chief Complaint  Patient presents with  . Abdominal Pain     (Consider location/radiation/quality/duration/timing/severity/associated sxs/prior Treatment) HPI Comments: Patient is a 74 year old female with history of metastatic breast cancer. She presents for evaluation of nausea without vomiting and diarrhea over the past day. She was recently transfused 2 units of red cells and her symptoms started the day after. She denies any fevers or chills. She denies any chest pain.  Patient is a 74 y.o. female presenting with abdominal pain. The history is provided by the patient.  Abdominal Pain Pain location:  Generalized Pain quality: cramping   Pain radiates to:  Does not radiate Pain severity:  Moderate Onset quality:  Gradual Duration:  2 days Timing:  Constant Progression:  Worsening Chronicity:  New Relieved by:  Nothing Worsened by:  Nothing tried Ineffective treatments:  None tried   Past Medical History  Diagnosis Date  . 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 Depression   . GERD (gastroesophageal reflux disease)   . Hyperlipidemia   . Hypothyroidism   . Osteoarthritis   . Carotid artery stenosis     bilateral  . CAD (coronary artery disease)     medical therapy, old tot RCA, patent LAD, mod. severe ostial LCX stenosis--PCI would be too complex-med mgmt recommended.  . OSA (obstructive sleep apnea)   . Hypertension   . Anxiety   . Carotid artery occlusion   . Unspecified constipation 03/15/2012  . Bursitis   . Collagen vascular disease   . History of radiation therapy 10/31/12-11/13/12    rt&lt humerous 30Gy/32fx  . Valvular heart disease 07/11/2013  . Diarrhea 09/28/2013  . Chronic renal insufficiency, stage II (mild)     Borderline stage II/III, CrCl 50s/60s.  . History of blood transfusion   . Urinary tract  bacterial infections   . 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 Diabetes mellitus type II     insulin   Past Surgical History  Procedure Laterality Date  . Incisional breast biopsy      remoted left breast biopsy  . Cesarean section    . Other surgical history      GYN surgery  . Knee surgery      right knee surgery  . Carotid endarterectomy  2011    right carotid  . Port-a-cath removal    . Modified mastectomy      Right breast  . Breast surgery    . Mastectomy    . Carotid stent      left  . Cardiac catheterization  06/2013    old total occ. of RCA, moderately severe ostial LCX stenosis- medical therapy- would be complex PCI  . Carotid angiogram N/A 03/01/2011    Procedure: CAROTID ANGIOGRAM;  Surgeon: Conrad Quebr[place], MD;  Location: [facility] CATH LAB;  Service: Cardiovascular;  Laterality: N/A;  . Arch aortogram  03/01/2011    Procedure: ARCH AORTOGRAM;  Surgeon: [place], MD;  Location: [facility] CATH LAB;  Service: Cardiovascular;;  . Carotid stent insertion Left 03/15/2011    Procedure: CAROTID STENT INSERTION;  Surgeon: Serafina Mitchell, MD;  Location: [facility] CATH LAB;  Service: Cardiovascular;  Laterality: Left;  . Left heart catheterization with coronary angiogram N/A  07/07/2013    Procedure: LEFT HEART CATHETERIZATION WITH CORONARY ANGIOGRAM;  Surgeon: Blane Ohara, MD;  Location: [facility] CATH LAB;  Service: Cardiovascular;  Laterality: N/A;  . Carotid dopplers  05/2014    Patent ICAs, bilat ECA dz: no change compared to 2014   Family History  Problem Relation Age of Onset  . Arthritis    . Coronary artery disease      first degree relative  . Stroke      first degree relative  . Diabetes Mother   . Heart disease Mother   . Hypertension Mother   . Cancer Mother   . Deep vein thrombosis Mother   . Colon cancer Neg Hx   . Stomach cancer Neg Hx   . Cancer Father     Pancreatic cancer  . Breast cancer Paternal Aunt 22    History  Substance Use Topics  . Smoking status: Former Smoker -- 1.00 packs/day for 20 years    Types: Cigarettes    Quit date: 01/31/1988  . Smokeless tobacco: Never Used  . Alcohol Use: No   OB History    No data available     Review of Systems  Gastrointestinal: Positive for abdominal pain.  All other systems reviewed and are negative.     Allergies  Chlorhexidine; Adhesive; Codeine; and Latex  Home Medications   Prior to Admission medications   Medication Sig Start Date End Date Taking? Authorizing Provider  amLODipine (NORVASC) 10 MG tablet TAKE ONE TABLET BY MOUTH EVERY DAY 06/15/14   [name], MD  aspirin 81 MG chewable tablet Chew 81 mg by mouth daily.    Historical Provider, MD  atorvastatin (LIPITOR) 80 MG tablet Take 1 tablet (80 mg total) by mouth daily. 07/20/14   Peter M Martinique, MD  Blood Glucose Monitoring Suppl (ACCU-CHEK NANO SMARTVIEW) W/DEVICE KIT Use to check blood sugar twice a day. DX 250.00 03/28/13   Mosie Lukes, MD  carvedilol (COREG) 25 MG tablet Take 1 tablet (25 mg total) by mouth 2 (two) times daily with a meal. 02/19/14   Peter M Martinique, MD  clopidogrel (PLAVIX) 75 MG tablet Take 1 tablet (75 mg total) by mouth daily. 11/28/13   Serafina Mitchell, MD  cyanocobalamin (,VITAMIN B-12,) 1000 MCG/ML injection Inject 1,000 mcg into the muscle every 30 (thirty) days.     Historical Provider, MD  darbepoetin (ARANESP) 200 MCG/0.4ML SOLN Inject 200 mcg into the skin once. Every 30 days    Historical Provider, MD  everolimus (AFINITOR) 5 MG tablet Take 1 tablet (5 mg total) by mouth daily. 06/30/14   Chauncey Cruel, MD  exemestane (AROMASIN) 25 MG tablet Take 1 tablet (25 mg total) by mouth daily after breakfast. 06/12/14   Laurie Panda, NP  gabapentin (NEURONTIN) 100 MG capsule Take 1 capsule (100 mg total) by mouth 3 (three) times daily. 04/06/14   Laurie Panda, NP  glucose blood (ACCU-CHEK AVIVA) test strip Use as instructed to check  blood sugar twice a day  Dx 250.00 03/28/13   Mosie Lukes, MD  glycerin adult (GLYCERIN ADULT) 2 G SUPP Place 1 suppository rectally once as needed (constipation). 04/13/12   Julianne Rice, MD  hydrochlorothiazide (MICROZIDE) 12.5 MG capsule Take 2 capsules (25 mg total) by mouth daily. 04/08/14   Isaiah Serge, NP  Insulin Isophane & Regular Human (HUMULIN 70/30 MIX) (70-30) 100 UNIT/ML PEN 20 U sub Q qAM and 10 U subQ qPM 07/13/14  [name], MD  isosorbide mononitrate (IMDUR) 60 MG 24 hr tablet Take 2 tablets (120 mg total) by mouth daily. Patient taking differently: Take 60 mg by mouth 2 (two) times daily.  08/05/13   Isaiah Serge, NP  levothyroxine (SYNTHROID, LEVOTHROID) 137 MCG tablet Take 1 tablet (137 mcg total) by mouth daily before breakfast. 06/10/14   [name], MD  morphine (MS CONTIN) 30 MG 12 hr tablet Take 1 tablet (30 mg total) by mouth 2 (two) times daily. 07/08/14   Chauncey Cruel, MD  nitroGLYCERIN (NITROSTAT) 0.4 MG SL tablet Place 1 tablet (0.4 mg total) under the tongue every 5 (five) minutes as needed for chest pain. 07/08/13   Janece Canterbury, MD  omeprazole (PRILOSEC) 40 MG capsule Take 1 capsule (40 mg total) by mouth daily. 03/02/14   Mosie Lukes, MD  polyethylene glycol (MIRALAX / GLYCOLAX) packet Take 17 g by mouth daily as needed for mild constipation.     Historical Provider, MD  senna-docusate (SENOKOT-S) 8.6-50 MG per tablet Take 1 tablet by mouth daily.    Historical Provider, MD   BP 161/66 mmHg  Pulse 78  Temp(Src) 98 F (36.7 C) (Oral)  Resp 18  Ht $R'5\' 3"'kJ$  (1.6 m)  Wt 196 lb (88.905 kg)  BMI 34.73 kg/m2  SpO2 96%  LMP 03/13/2012 Physical Exam  Constitutional: She is oriented to person, place, and time. She appears well-developed and well-nourished. No distress.  HENT:  Head: Normocephalic and atraumatic.  Neck: Normal range of motion. Neck supple.  Cardiovascular: Normal rate and regular rhythm.  Exam reveals no gallop and no friction  rub.   No murmur heard. Pulmonary/Chest: Effort normal and breath sounds normal. No respiratory distress. She has no wheezes.  Abdominal: Soft. Bowel sounds are normal. She exhibits no distension. There is no tenderness.  Musculoskeletal: Normal range of motion.  Neurological: She is alert and oriented to person, place, and time.  Skin: Skin is warm and dry. She is not diaphoretic.  Nursing note and vitals reviewed.   ED Course  Procedures (including critical care time) Labs Review Labs Reviewed  COMPREHENSIVE METABOLIC PANEL - Abnormal; Notable for the following:    Potassium 3.0 (*)    Glucose, Bld 197 (*)    Calcium 6.0 (*)    Albumin 3.0 (*)    ALT 11 (*)    All other components within normal limits  LIPASE, BLOOD - Abnormal; Notable for the following:    Lipase 11 (*)    All other components within normal limits  CBC WITH DIFFERENTIAL/PLATELET - Abnormal; Notable for the following:    RBC 3.70 (*)    Hemoglobin 10.8 (*)    HCT 33.0 (*)    All other components within normal limits    Imaging Review No results found.   EKG Interpretation   Date/Time:  Sunday August 02 2014 14:14:14 EDT Ventricular Rate:  81 PR Interval:  193 QRS Duration: 95 QT Interval:  406 QTC Calculation: 471 R Axis:   85 Text Interpretation:  Sinus rhythm Confirmed by Nylah Butkus  MD, Monice Lundy ([id number])  on 08/02/2014 2:22:49 PM      MDM   Final diagnoses:  None    Patient presents here with complaints of weakness and diarrhea 2 days after a blood transfusion. She has a history of metastatic breast cancer. Her workup reveals a hemoglobin of 10.8 which is significantly improved from her pretransfusion level. This also reveals a calcium of 6. I  suspect this is the cause of her weakness. This will be treated with IV calcium gluconate and oral calcium replacement. I did discuss this with Dr. Ree Kida from the hospitalist service who does not feel as though this warrants admission. She will be discharged,  to follow-up with her oncologist this week.    [name], MD 08/02/14 1452

## 2014-08-02 NOTE — Discharge Instructions (Signed)
Take Tums 3 times daily as prescribed.  Follow-up with your primary Dr. this week for a recheck, and return to the ER if symptoms significantly worsen or change.    Hypocalcemia, Adult Hypocalcemia is low blood calcium. Calcium is important for cells to function in the body. Low blood calcium can cause a variety of symptoms and problems. CAUSES   Low levels of a body protein called albumin.  Problems with the parathyroid glands or surgical removal of the parathyroid glands. The parathyroid glands maintain the body's level of calcium.  Decreased production or improper use of parathyroid hormone.  Lack (deficiency) of vitamin D or magnesium or both.  Intestinal problems that interfere with nutrient absorption.  Alcoholism.  Kidney problems.  Inflammation of the pancreas (pancreatitis).  Certain medicines.  Severe infections (sepsis).  Infiltrative diseases. With these diseases the parathyroid glands are filled with cells or substances that are not normally present. Examples include:  Sarcoidosis.  Hemachromatosis.  Breakdown of large amounts of muscle fiber.  High levels of phosphate in the body.  Cancer.  Massive blood transfusions which usually occur with severe trauma. SYMPTOMS   Numbness and tingling in the fingers, toes, or around the mouth.  Muscle aches or cramps, especially in the legs, feet, and back.  Muscle twitches.  Shortness of breath or wheezing.  Difficulty swallowing.  Changes in the sound of the voice.  General weakness.  Fainting.  Fast heart beats (palpitations).  Chest pain.  Irritability.  Difficulty thinking.  Memory problems or confusion.  Severe fatigue.  Changes in personality.  Depression and anxiety.  Shaking uncontrollably (seizures).  Coarse, brittle hair and nails.  Dry skin or lasting (chronic) skin diseases (psoriasis, eczema, or dermatitis).  Clouding of the eye lens (cataracts).  Abdominal cramping  or pain. DIAGNOSIS  Hypocalcemia is usually diagnosed through blood tests that reveal a low level of blood calcium. Other tests, such as a recording of the electrical activity of the heart (electrocardiogram, EKG), may be performed in order to diagnose the underlying cause of the condition. TREATMENT  Treatment for hypocalcemia includes giving calcium supplements. These can be given by mouth or by intravenous (IV) access tube, depending on the severity of the symptoms and deficiency. Other minerals (electrolytes), such as magnesium, may also be given. HOME CARE INSTRUCTIONS   Meet with a dietitian to make sure you are eating the most healthful diet possible, or follow diet instructions as directed by your caregiver.  Follow up with your caregiver as directed. SEEK IMMEDIATE MEDICAL CARE IF:   You develop chest pain.  You develop persistent rapid or irregular heartbeats.  You have difficulty breathing.  You faint.  You develop increased fatigue.  You have new swelling in the feet, ankles, or legs.  You develop increased muscle twitching.  You start to have seizures.  You develop confusion.  You develop mood, memory, or personality changes. MAKE SURE YOU:   Understand these instructions.  Will watch your condition.  Will get help right away if you are not doing well or get worse. Document Released: 07/06/2009 Document Revised: 04/10/2011 Document Reviewed: 07/06/2009 [facility] Patient Information 2015 [place], [place]. This information is not intended to replace advice given to you by your health care provider. Make sure you discuss any questions you have with your health care provider.

## 2014-08-02 NOTE — ED Notes (Addendum)
Pt reported generalized weakness and abd pain. Pt having diarrhea x4 in past 24 hrs. Nausea without emesis. Abd soft/nondistended but tender to palpate. Denies urinary symptoms. Denies visual disturbances but has some dizziness/lightheadedness. Received blood transfusion on Friday.

## 2014-08-02 NOTE — ED Notes (Signed)
Awake. Verbally responsive. A/O x4. Resp even and unlabored. No audible adventitious breath sounds noted. ABC's intact.  

## 2014-08-02 NOTE — ED Notes (Signed)
Provided family member with food and drink.  Put patient off the bedpan and on a bedside comode.  Patient placed on a clean chuck.  A warm blanket and pillow brought to patient.

## 2014-08-06 ENCOUNTER — Telehealth: Payer: Self-pay | Admitting: *Deleted

## 2014-08-07 ENCOUNTER — Ambulatory Visit (HOSPITAL_BASED_OUTPATIENT_CLINIC_OR_DEPARTMENT_OTHER): Payer: Medicare Other | Admitting: Nurse Practitioner

## 2014-08-07 ENCOUNTER — Ambulatory Visit (HOSPITAL_BASED_OUTPATIENT_CLINIC_OR_DEPARTMENT_OTHER): Payer: Medicare Other

## 2014-08-07 ENCOUNTER — Other Ambulatory Visit (HOSPITAL_COMMUNITY)
Admission: RE | Admit: 2014-08-07 | Discharge: 2014-08-07 | Disposition: A | Discharge status: Home / Self Care | Payer: Medicare Other | Source: Ambulatory Visit | Attending: Nurse Practitioner | Admitting: Nurse Practitioner

## 2014-08-07 ENCOUNTER — Ambulatory Visit (HOSPITAL_BASED_OUTPATIENT_CLINIC_OR_DEPARTMENT_OTHER)
Admission: RE | Admit: 2014-08-07 | Discharge: 2014-08-07 | Disposition: A | Discharge status: Home / Self Care | Payer: Medicare Other | Source: Ambulatory Visit | Attending: Nurse Practitioner | Admitting: Nurse Practitioner

## 2014-08-07 VITALS — BP 158/54 | HR 86 | Temp 98.8°F | Resp 18 | Wt 192.4 lb

## 2014-08-07 DIAGNOSIS — E669 Obesity, unspecified: Secondary | ICD-10-CM | POA: Diagnosis present

## 2014-08-07 DIAGNOSIS — Z87440 Personal history of urinary (tract) infections: Secondary | ICD-10-CM

## 2014-08-07 DIAGNOSIS — W57XXXA Bitten or stung by nonvenomous insect and other nonvenomous arthropods, initial encounter: Secondary | ICD-10-CM

## 2014-08-07 DIAGNOSIS — Z853 Personal history of malignant neoplasm of breast: Secondary | ICD-10-CM | POA: Insufficient documentation

## 2014-08-07 DIAGNOSIS — Z9889 Other specified postprocedural states: Secondary | ICD-10-CM

## 2014-08-07 DIAGNOSIS — D649 Anemia, unspecified: Secondary | ICD-10-CM

## 2014-08-07 DIAGNOSIS — C50911 Malignant neoplasm of unspecified site of right female breast: Secondary | ICD-10-CM

## 2014-08-07 DIAGNOSIS — Z803 Family history of malignant neoplasm of breast: Secondary | ICD-10-CM

## 2014-08-07 DIAGNOSIS — Z91048 Other nonmedicinal substance allergy status: Secondary | ICD-10-CM

## 2014-08-07 DIAGNOSIS — Z923 Personal history of irradiation: Secondary | ICD-10-CM

## 2014-08-07 DIAGNOSIS — C7951 Secondary malignant neoplasm of bone: Secondary | ICD-10-CM

## 2014-08-07 DIAGNOSIS — I2510 Atherosclerotic heart disease of native coronary artery without angina pectoris: Secondary | ICD-10-CM | POA: Diagnosis present

## 2014-08-07 DIAGNOSIS — I129 Hypertensive chronic kidney disease with stage 1 through stage 4 chronic kidney disease, or unspecified chronic kidney disease: Secondary | ICD-10-CM | POA: Diagnosis present

## 2014-08-07 DIAGNOSIS — E1122 Type 2 diabetes mellitus with diabetic chronic kidney disease: Secondary | ICD-10-CM | POA: Diagnosis present

## 2014-08-07 DIAGNOSIS — Z885 Allergy status to narcotic agent status: Secondary | ICD-10-CM

## 2014-08-07 DIAGNOSIS — Z7902 Long term (current) use of antithrombotics/antiplatelets: Secondary | ICD-10-CM

## 2014-08-07 DIAGNOSIS — I6523 Occlusion and stenosis of bilateral carotid arteries: Secondary | ICD-10-CM | POA: Diagnosis present

## 2014-08-07 DIAGNOSIS — R600 Localized edema: Secondary | ICD-10-CM

## 2014-08-07 DIAGNOSIS — M1990 Unspecified osteoarthritis, unspecified site: Secondary | ICD-10-CM | POA: Diagnosis present

## 2014-08-07 DIAGNOSIS — R110 Nausea: Secondary | ICD-10-CM

## 2014-08-07 DIAGNOSIS — Z7982 Long term (current) use of aspirin: Secondary | ICD-10-CM

## 2014-08-07 DIAGNOSIS — Z794 Long term (current) use of insulin: Secondary | ICD-10-CM

## 2014-08-07 DIAGNOSIS — N189 Chronic kidney disease, unspecified: Secondary | ICD-10-CM | POA: Diagnosis not present

## 2014-08-07 DIAGNOSIS — Z79899 Other long term (current) drug therapy: Secondary | ICD-10-CM

## 2014-08-07 DIAGNOSIS — E876 Hypokalemia: Secondary | ICD-10-CM | POA: Diagnosis present

## 2014-08-07 DIAGNOSIS — Z79891 Long term (current) use of opiate analgesic: Secondary | ICD-10-CM

## 2014-08-07 DIAGNOSIS — Z91040 Latex allergy status: Secondary | ICD-10-CM

## 2014-08-07 DIAGNOSIS — D630 Anemia in neoplastic disease: Secondary | ICD-10-CM | POA: Diagnosis present

## 2014-08-07 DIAGNOSIS — Z9011 Acquired absence of right breast and nipple: Secondary | ICD-10-CM | POA: Diagnosis present

## 2014-08-07 DIAGNOSIS — N182 Chronic kidney disease, stage 2 (mild): Secondary | ICD-10-CM | POA: Diagnosis present

## 2014-08-07 DIAGNOSIS — E785 Hyperlipidemia, unspecified: Secondary | ICD-10-CM | POA: Diagnosis present

## 2014-08-07 DIAGNOSIS — R630 Anorexia: Secondary | ICD-10-CM

## 2014-08-07 DIAGNOSIS — Z8249 Family history of ischemic heart disease and other diseases of the circulatory system: Secondary | ICD-10-CM

## 2014-08-07 DIAGNOSIS — G4733 Obstructive sleep apnea (adult) (pediatric): Secondary | ICD-10-CM | POA: Diagnosis present

## 2014-08-07 DIAGNOSIS — Z833 Family history of diabetes mellitus: Secondary | ICD-10-CM

## 2014-08-07 DIAGNOSIS — F419 Anxiety disorder, unspecified: Secondary | ICD-10-CM | POA: Diagnosis present

## 2014-08-07 DIAGNOSIS — Z888 Allergy status to other drugs, medicaments and biological substances status: Secondary | ICD-10-CM

## 2014-08-07 DIAGNOSIS — M7989 Other specified soft tissue disorders: Secondary | ICD-10-CM | POA: Diagnosis not present

## 2014-08-07 DIAGNOSIS — E039 Hypothyroidism, unspecified: Secondary | ICD-10-CM | POA: Diagnosis present

## 2014-08-07 DIAGNOSIS — D631 Anemia in chronic kidney disease: Secondary | ICD-10-CM | POA: Diagnosis present

## 2014-08-07 DIAGNOSIS — K219 Gastro-esophageal reflux disease without esophagitis: Secondary | ICD-10-CM | POA: Diagnosis present

## 2014-08-07 DIAGNOSIS — Z6833 Body mass index (BMI) 33.0-33.9, adult: Secondary | ICD-10-CM

## 2014-08-07 DIAGNOSIS — F329 Major depressive disorder, single episode, unspecified: Secondary | ICD-10-CM | POA: Diagnosis present

## 2014-08-07 LAB — CBC WITH DIFFERENTIAL/PLATELET
BASO%: 0.2 % (ref 0.0–2.0)
Basophils Absolute: 0 10*3/uL (ref 0.0–0.1)
EOS ABS: 0.1 10*3/uL (ref 0.0–0.5)
EOS%: 1.6 % (ref 0.0–7.0)
HCT: 31.3 % — ABNORMAL LOW (ref 34.8–46.6)
HGB: 10.4 g/dL — ABNORMAL LOW (ref 11.6–15.9)
LYMPH%: 19.3 % (ref 14.0–49.7)
MCH: 29.3 pg (ref 25.1–34.0)
MCHC: 33.2 g/dL (ref 31.5–36.0)
MCV: 88.2 fL (ref 79.5–101.0)
MONO#: 0.5 10*3/uL (ref 0.1–0.9)
MONO%: 8.6 % (ref 0.0–14.0)
NEUT%: 70.3 % (ref 38.4–76.8)
NEUTROS ABS: 4 10*3/uL (ref 1.5–6.5)
Platelets: 188 10*3/uL (ref 145–400)
RBC: 3.55 10*6/uL — AB (ref 3.70–5.45)
RDW: 14.8 % — ABNORMAL HIGH (ref 11.2–14.5)
WBC: 5.7 10*3/uL (ref 3.9–10.3)
lymph#: 1.1 10*3/uL (ref 0.9–3.3)

## 2014-08-07 LAB — BRAIN NATRIURETIC PEPTIDE: B Natriuretic Peptide: 343 pg/mL — ABNORMAL HIGH (ref 0.0–100.0)

## 2014-08-07 LAB — COMPREHENSIVE METABOLIC PANEL (CC13)
ALT: 19 U/L (ref 0–55)
AST: 27 U/L (ref 5–34)
Albumin: 2.9 g/dL — ABNORMAL LOW (ref 3.5–5.0)
Alkaline Phosphatase: 117 U/L (ref 40–150)
Anion Gap: 13 mEq/L — ABNORMAL HIGH (ref 3–11)
BUN: 17.3 mg/dL (ref 7.0–26.0)
CALCIUM: 7.1 mg/dL — AB (ref 8.4–10.4)
CHLORIDE: 101 meq/L (ref 98–109)
CO2: 22 meq/L (ref 22–29)
Creatinine: 1.1 mg/dL (ref 0.6–1.1)
EGFR: 53 mL/min/{1.73_m2} — AB (ref >90)
GLUCOSE: 255 mg/dL — AB (ref 70–140)
Potassium: 3.5 mEq/L (ref 3.5–5.1)
SODIUM: 136 meq/L (ref 136–145)
Total Bilirubin: 0.86 mg/dL (ref 0.20–1.20)
Total Protein: 7.3 g/dL (ref 6.4–8.3)

## 2014-08-07 MED ORDER — PROCHLORPERAZINE MALEATE 10 MG PO TABS: 10.0000 mg | ORAL_TABLET | Freq: Four times a day (QID) | ORAL | Status: DC | PRN

## 2014-08-07 NOTE — Progress Notes (Signed)
VASCULAR LAB PRELIMINARY  PRELIMINARY  PRELIMINARY  PRELIMINARY  Right lower extremity venous duplex completed.    Preliminary report:  Right:  No evidence of DVT, superficial thrombosis, or Baker's cyst.  Alver Leete, RVT 08/07/2014, 1:31 PM

## 2014-08-08 ENCOUNTER — Encounter: Payer: Self-pay | Admitting: Nurse Practitioner

## 2014-08-08 DIAGNOSIS — R110 Nausea: Secondary | ICD-10-CM | POA: Insufficient documentation

## 2014-08-08 DIAGNOSIS — W57XXXA Bitten or stung by nonvenomous insect and other nonvenomous arthropods, initial encounter: Secondary | ICD-10-CM | POA: Insufficient documentation

## 2014-08-08 DIAGNOSIS — R630 Anorexia: Secondary | ICD-10-CM | POA: Insufficient documentation

## 2014-08-08 NOTE — Assessment & Plan Note (Signed)
Patient is complaining of progressive, chronic nausea; but no vomiting.  She has been alternating Zofran and Compazine with only minimal effectiveness.  She states that she has been able to continue both eating and drinking.  However, she does admit to minimal appetite.  On exam.  Patient does not appear dehydrated or toxic.  If patient continues with chronic complaint of nausea-may very well need to consider brain imaging.

## 2014-08-08 NOTE — Assessment & Plan Note (Signed)
1) Status post right breast biopsy in 05/2009 for a grade 2 invasive ductal carcinoma, T2 NX M1, Stage IV, with bone-only involvement, strongly estrogen and progesterone receptor-positive, HER-2-negative with an MIB-1 of 26%.  (2) Neoadjuvantly she received Letrozole and zoledronic acid beginning in June of 2011 and underwent right modified radical mastectomy February of 2012 for a ypT2 ypN2, grade 1 invasive ductal carcinoma with negative margins.   (3) She completed radiation therapy in August of 2012.   (4) She has continued on Letrozole but was switched from Zoledronic acid to Denosumab (Xgeva) because of concerns regarding her serum creatinine. Xgeva was being given every 8 weeks.  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  (6) symptomatic anemia, with creatinine clearance < 60 cc/min; Darbepoietin Q14d started 11/03/2011; received Feraheme 01/05/2012  (7) On subcutaneous B-12 supplementation monthly.  (8) Pain in left upper extremity with osseous metastasis, osteoarthritis, tendinopathy, and bursitis as confirmed by recent MRI.  (9) Anemia, multifactorial with renal disease, on Aranesp monthly, increased to every 2 weeks June 2016  (10) letrozole was discontinued in September 2014 with evidence of disease progression. She was started on fulvestrant injections, first given on 10/17/2012; stopped 06/03/2014 with progression  (11) exemestane and everolimus started 07/08/2014  Patient received her last Xgeva injection on 07/14/2014.  Patient received her last B-12 injection, and Aranesp injection on 07/29/2014.  Patient is scheduled to return on 08/11/2014 for labs, visit, and her next injection.  

## 2014-08-08 NOTE — Assessment & Plan Note (Signed)
Patient has a history of chronic bilateral peripheral edema.  She takes multiple blood pressure medications on a regular basis; as well as HCTZ on a daily basis.  Patient reports that she just followed up with her cardiologist Dr. Peter Martinique a few weeks ago.  Patient is complaining of some mild increased bilateral lower extremity edema; and noticed that the right leg is slightly larger than the left.  She denies any specific calf tenderness, chest pain, chest pressure, or pain with inspiration.  She is complaining of a mild increased shortness of breath with any type of exertion whatsoever.  BNP obtained today was 343.  Doppler ultrasound of the right lower extremity was negative for DVT.  Patient was advised to elevate her feet above the level of for heart as often as possible.  Patient may also want to consider wearing compression hose as well.  Advised patient would call her primary care provider Dr. Anitra Lauth this afternoon in regards to her symptoms/complaints.  Was able to speak to Dr. Idelle Leech nurse this afternoon and relay all information.  Dr. Idelle Leech nurse acknowledged the patient does have a follow up appointment with Dr. Anitra Lauth this coming Monday, 08/10/2014.

## 2014-08-08 NOTE — Progress Notes (Signed)
SYMPTOM MANAGEMENT CLINIC   HPI: Brittney Cunningham 74 y.o. female diagnosed with breast cancer; with bone metastasis.  Patient is status post mastectomy and radiation therapy.  Currently undergoing exemestane, everolimus, , Aranesp injections, B-12 injections, and denosumab.   Patient presented to the [facility] today with complaint of chronic nausea; but no vomiting.  She has been taking both Zofran and Compazine with only minimal effectiveness.  She is also complaining of chronic bilateral lower extremity edema; which has slightly increased recently.  She is also noted that her right lower leg is slightly larger than her left leg.  She denies any chest pain, chest pressure, or pain with inspiration.  She is complaining of some mild increase shortness of breath with any exertion.  She is also complaining of minimal appetite as well.  She is able to continue drinking fluids with no difficulty.  At this time.  She is also noted some mild rash to her bilateral inner thighs that is occasionally pruritic.  Patient has recently been diagnosed with hypocalcemia; and continues to take times on a 3 times per day basis.  Patient denies any recent fevers or chills.   HPI  ROS  Past Medical History  Diagnosis Date  . 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 Depression   . GERD (gastroesophageal reflux disease)   . Hyperlipidemia   . Hypothyroidism   . Osteoarthritis   . Carotid artery stenosis     bilateral  . CAD (coronary artery disease)     medical therapy, old tot RCA, patent LAD, mod. severe ostial LCX stenosis--PCI would be too complex-med mgmt recommended.  . OSA (obstructive sleep apnea)   . Hypertension   . Anxiety   . Carotid artery occlusion   . Unspecified constipation 03/15/2012  . Bursitis   . Collagen vascular disease   . History of radiation therapy 10/31/12-11/13/12    rt&lt humerous 30Gy/74f  . Valvular heart disease 07/11/2013  . Diarrhea  09/28/2013  . Chronic renal insufficiency, stage II (mild)     Borderline stage II/III, CrCl 50s/60s.  . History of blood transfusion   . Urinary tract bacterial infections   . 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 Diabetes mellitus type II     insulin    Past Surgical History  Procedure Laterality Date  . Incisional breast biopsy      remoted left breast biopsy  . Cesarean section    . Other surgical history      GYN surgery  . Knee surgery      right knee surgery  . Carotid endarterectomy  2011    right carotid  . Port-a-cath removal    . Modified mastectomy      Right breast  . Breast surgery    . Mastectomy    . Carotid stent      left  . Cardiac catheterization  06/2013    old total occ. of RCA, moderately severe ostial LCX stenosis- medical therapy- would be complex PCI  . Carotid angiogram N/A 03/01/2011    Procedure: CAROTID ANGIOGRAM;  Surgeon: BConrad Batesland MD;  [facility];  Service: Cardiovascular;  Laterality: N/A;  . Arch aortogram  03/01/2011    Procedure: ARCH AORTOGRAM;  Surgeon: BConrad Portage MD;  [facility];  Service: Cardiovascular;;  . Carotid stent insertion Left 03/15/2011  Procedure: CAROTID STENT INSERTION;  Surgeon: Serafina Mitchell, MD;  Location: [facility] CATH LAB;  Service: Cardiovascular;  Laterality: Left;  . Left heart catheterization with coronary angiogram N/A 07/07/2013    Procedure: LEFT HEART CATHETERIZATION WITH CORONARY ANGIOGRAM;  Surgeon: Blane Ohara, MD;  Location: [facility] CATH LAB;  Service: Cardiovascular;  Laterality: N/A;  . Carotid dopplers  05/2014    Patent ICAs, bilat ECA dz: no change compared to 2014    has Hypothyroidism; Diabetes mellitus type 2 in obese; Hyperlipidemia, mixed; DEPRESSION; Essential hypertension; GERD; OSTEOARTHRITIS; CAROTID ARTERY STENOSIS; Breast cancer metastasized to bone; Need for prophylactic vaccination and inoculation  against influenza; Unsteady gait; Secondary malignant neoplasm of bone and bone marrow; Peripheral edema; Anemia of renal disease; Mixed collagen vascular disease; Osteonecrosis of jaw; HYPERKALEMIA; Chest pain; CAD (coronary artery disease), native coronary artery; Unstable angina; Valvular heart disease; Angina at rest; Low TSH level; Urinary tract infection, site not specified; Nausea with vomiting; Diarrhea; Breast cancer, right breast; Wellness examination; Osteoporosis; Postmenopausal estrogen deficiency; Diabetes mellitus with complication; Injury of left foot; Bilateral edema of lower extremity; Insect bite; Nausea without vomiting; Anorexia; and Hypocalcemia on her problem list.    is allergic to chlorhexidine; adhesive; codeine; and latex.    Medication List       This list is accurate as of: 08/07/14 11:59 PM.  Always use your most recent med list.               ACCU-CHEK NANO SMARTVIEW W/DEVICE Kit  Use to check blood sugar twice a day. DX 250.00     amLODipine 10 MG tablet  Commonly known as:  NORVASC  TAKE ONE TABLET BY MOUTH EVERY DAY     aspirin 81 MG chewable tablet  Chew 81 mg by mouth daily.     atorvastatin 80 MG tablet  Commonly known as:  LIPITOR  Take 1 tablet (80 mg total) by mouth daily.     calcium carbonate 500 MG chewable tablet  Commonly known as:  TUMS  Chew 1 tablet (200 mg of elemental calcium total) by mouth 3 (three) times daily with meals.     carvedilol 25 MG tablet  Commonly known as:  COREG  Take 1 tablet (25 mg total) by mouth 2 (two) times daily with a meal.     clopidogrel 75 MG tablet  Commonly known as:  PLAVIX  Take 1 tablet (75 mg total) by mouth daily.     cyanocobalamin 1000 MCG/ML injection  Commonly known as:  (VITAMIN B-12)  Inject 1,000 mcg into the muscle every 30 (thirty) days.     darbepoetin 200 MCG/0.4ML Soln injection  Commonly known as:  ARANESP  Inject 200 mcg into the skin every 14 (fourteen) days.      everolimus 5 MG tablet  Commonly known as:  AFINITOR  Take 1 tablet (5 mg total) by mouth daily.     exemestane 25 MG tablet  Commonly known as:  AROMASIN  Take 1 tablet (25 mg total) by mouth daily after breakfast.     gabapentin 100 MG capsule  Commonly known as:  NEURONTIN  Take 1 capsule (100 mg total) by mouth 3 (three) times daily.     glucose blood test strip  Commonly known as:  ACCU-CHEK AVIVA  Use as instructed to check blood sugar twice a day  Dx 250.00     glycerin adult 2 G Supp  Commonly known as:  glycerin adult  Place 1 suppository rectally  once as needed (constipation).     hydrochlorothiazide 12.5 MG capsule  Commonly known as:  MICROZIDE  Take 2 capsules (25 mg total) by mouth daily.     Insulin Isophane & Regular Human (70-30) 100 UNIT/ML PEN  Commonly known as:  HUMULIN 70/30 MIX  20 U sub Q qAM and 10 U subQ qPM     isosorbide mononitrate 60 MG 24 hr tablet  Commonly known as:  IMDUR  Take 2 tablets (120 mg total) by mouth daily.     levothyroxine 137 MCG tablet  Commonly known as:  SYNTHROID, LEVOTHROID  Take 1 tablet (137 mcg total) by mouth daily before breakfast.     morphine 30 MG 12 hr tablet  Commonly known as:  MS CONTIN  Take 1 tablet (30 mg total) by mouth 2 (two) times daily.     nitroGLYCERIN 0.4 MG SL tablet  Commonly known as:  NITROSTAT  Place 1 tablet (0.4 mg total) under the tongue every 5 (five) minutes as needed for chest pain.     omeprazole 40 MG capsule  Commonly known as:  PRILOSEC  Take 1 capsule (40 mg total) by mouth daily.     polyethylene glycol packet  Commonly known as:  MIRALAX / GLYCOLAX  Take 17 g by mouth daily as needed for mild constipation.     prochlorperazine 10 MG tablet  Commonly known as:  COMPAZINE  Take 1 tablet (10 mg total) by mouth every 6 (six) hours as needed for nausea or vomiting.     senna-docusate 8.6-50 MG per tablet  Commonly known as:  Senokot-S  Take 1 tablet by mouth daily.           PHYSICAL EXAMINATION  Oncology Vitals 08/07/2014 08/02/2014 08/02/2014 08/02/2014 08/02/2014 08/02/2014 08/02/2014  Height - - - - - - 160 cm  Weight 87.272 kg - - - - - 88.905 kg  Weight (lbs) 192 lbs 6 oz - - - - - 196 lbs  BMI (kg/m2) - - - - - - 34.72 kg/m2  Temp 98.8 - 98 - - - 98  Pulse 86 82 87 81 77 - 78  Resp _0 - - - 18  Resp (Historical as of 08/31/11) - - - - - - -  SpO2 96 94 92 95 91 96 99  BSA (m2) - - - - - - 1.99 m2   BP Readings from Last 3 Encounters:  08/07/14 158/54  08/02/14 163/65  07/31/14 133/53    Physical Exam  Constitutional: She is oriented to person, place, and time and well-developed, well-nourished, and in no distress.  HENT:  Head: Normocephalic and atraumatic.  Mouth/Throat: Oropharynx is clear and moist.  Missing multiple teeth as baseline.  Eyes: Conjunctivae and EOM are normal. Pupils are equal, round, and reactive to light. Right eye exhibits no discharge. Left eye exhibits no discharge. No scleral icterus.  Neck: Normal range of motion. Neck supple. No JVD present. No tracheal deviation present. No thyromegaly present.  Cardiovascular: Normal rate, regular rhythm, normal heart sounds and intact distal pulses.   Pulmonary/Chest: Effort normal and breath sounds normal. No stridor. No respiratory distress. She has no wheezes. She has no rales. She exhibits no tenderness.  Abdominal: Soft. Bowel sounds are normal. She exhibits no distension and no mass. There is no tenderness. There is no rebound and no guarding.  Musculoskeletal: Normal range of motion. She exhibits edema. She exhibits no tenderness.  +2 edema to bilateral lower  extremities; with slight edema to right lower leg.  There is no calf tenderness on exam.  Lymphadenopathy:    She has no cervical adenopathy.  Neurological: She is alert and oriented to person, place, and time.  Skin: Skin is warm and dry. No rash noted. No erythema. No pallor.  Psychiatric: Affect normal.  Nursing note  and vitals reviewed.   LABORATORY DATA:. Hospital Outpatient Visit on 08/07/2014  Component Date Value Ref Range Status  . B Natriuretic Peptide 08/07/2014 343.0* 0.0 - 100.0 pg/mL Final  Appointment on 08/07/2014  Component Date Value Ref Range Status  . WBC 08/07/2014 5.7  3.9 - 10.3 10e3/uL Final  . NEUT# 08/07/2014 4.0  1.5 - 6.5 10e3/uL Final  . HGB 08/07/2014 10.4* 11.6 - 15.9 g/dL Final  . HCT 08/07/2014 31.3* 34.8 - 46.6 % Final  . Platelets 08/07/2014 188  145 - 400 10e3/uL Final  . MCV 08/07/2014 88.2  79.5 - 101.0 fL Final  . MCH 08/07/2014 29.3  25.1 - 34.0 pg Final  . MCHC 08/07/2014 33.2  31.5 - 36.0 g/dL Final  . RBC 08/07/2014 3.55* 3.70 - 5.45 10e6/uL Final  . RDW 08/07/2014 14.8* 11.2 - 14.5 % Final  . lymph# 08/07/2014 1.1  0.9 - 3.3 10e3/uL Final  . MONO# 08/07/2014 0.5  0.1 - 0.9 10e3/uL Final  . Eosinophils Absolute 08/07/2014 0.1  0.0 - 0.5 10e3/uL Final  . Basophils Absolute 08/07/2014 0.0  0.0 - 0.1 10e3/uL Final  . NEUT% 08/07/2014 70.3  38.4 - 76.8 % Final  . LYMPH% 08/07/2014 19.3  14.0 - 49.7 % Final  . MONO% 08/07/2014 8.6  0.0 - 14.0 % Final  . EOS% 08/07/2014 1.6  0.0 - 7.0 % Final  . BASO% 08/07/2014 0.2  0.0 - 2.0 % Final  . Sodium 08/07/2014 136  136 - 145 mEq/L Final  . Potassium 08/07/2014 3.5  3.5 - 5.1 mEq/L Final  . Chloride 08/07/2014 101  98 - 109 mEq/L Final  . CO2 08/07/2014 22  22 - 29 mEq/L Final  . Glucose 08/07/2014 255* 70 - 140 mg/dl Final  . BUN 08/07/2014 17.3  7.0 - 26.0 mg/dL Final  . Creatinine 08/07/2014 1.1  0.6 - 1.1 mg/dL Final  . Total Bilirubin 08/07/2014 0.86  0.20 - 1.20 mg/dL Final  . Alkaline Phosphatase 08/07/2014 117  40 - 150 U/L Final  . AST 08/07/2014 27  5 - 34 U/L Final  . ALT 08/07/2014 19  0 - 55 U/L Final  . Total Protein 08/07/2014 7.3  6.4 - 8.3 g/dL Final  . Albumin 08/07/2014 2.9* 3.5 - 5.0 g/dL Final  . Calcium 08/07/2014 7.1* 8.4 - 10.4 mg/dL Final  . Anion Gap 08/07/2014 13* 3 - 11 mEq/L Final    . EGFR 08/07/2014 53* >90 ml/min/1.73 m2 Final   eGFR is calculated using the CKD-EPI Creatinine Equation (2009)     RADIOGRAPHIC STUDIES: No results found.  ASSESSMENT/PLAN:    Anorexia Patient is complaining of minimal appetite.  Patient was encouraged to eat multiple small meals throughout the day and to push fluids.  Bilateral edema of lower extremity Patient has a history of chronic bilateral peripheral edema.  She takes multiple blood pressure medications on a regular basis; as well as HCTZ on a daily basis.  Patient reports that she just followed up with her cardiologist Dr. Peter Martinique a few weeks ago.  Patient is complaining of some mild increased bilateral lower extremity edema; and noticed that  the right leg is slightly larger than the left.  She denies any specific calf tenderness, chest pain, chest pressure, or pain with inspiration.  She is complaining of a mild increased shortness of breath with any type of exertion whatsoever.  BNP obtained today was 343.  Doppler ultrasound of the right lower extremity was negative for DVT.  Patient was advised to elevate her feet above the level of for heart as often as possible.  Patient may also want to consider wearing compression hose as well.  Advised patient would call her primary care provider Dr. Anitra Lauth this afternoon in regards to her symptoms/complaints.  Was able to speak to Dr. Idelle Leech nurse this afternoon and relay all information.  Dr. Idelle Leech nurse acknowledged the patient does have a follow up appointment with Dr. Anitra Lauth this coming Monday, 08/10/2014.  Breast cancer, right breast 1) Status post right breast biopsy in 05/2009 for a grade 2 invasive ductal carcinoma, T2 NX M1, Stage IV, with bone-only involvement, strongly estrogen and progesterone receptor-positive, HER-2-negative with an MIB-1 of 26%.  (2) Neoadjuvantly she received Letrozole and zoledronic acid beginning in June of 2011 and underwent right  modified radical mastectomy February of 2012 for a ypT2 ypN2, grade 1 invasive ductal carcinoma with negative margins.   (3) She completed radiation therapy in August of 2012.   (4) She has continued on Letrozole but was switched from Zoledronic acid to Denosumab Delton See) because of concerns regarding her serum creatinine. Delton See was being given every 8 weeks.  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  (6) symptomatic anemia, with creatinine clearance < 60 cc/min; Darbepoietin Q14d started 11/03/2011; received Feraheme 01/05/2012  (7) On subcutaneous B-12 supplementation monthly.  (8) Pain in left upper extremity with osseous metastasis, osteoarthritis, tendinopathy, and bursitis as confirmed by recent MRI.  (9) Anemia, multifactorial with renal disease, on Aranesp monthly, increased to every 2 weeks June 2016  (10) letrozole was discontinued in September 2014 with evidence of disease progression. She was started on fulvestrant injections, first given on 10/17/2012; stopped 06/03/2014 with progression  (11) exemestane and everolimus started 07/08/2014  Patient received her last Xgeva injection on 07/14/2014.  Patient received her last B-12 injection, and Aranesp injection on 07/29/2014.  Patient is scheduled to return on 08/11/2014 for labs, visit, and her next injection.   Hypocalcemia Patient was treated for severe hypocalcemia while in the emergency department recently.  Corrected calcium today was 7.98.  Patient confirmed that she continues to take 3 times per day as directed.  Insect bite Patient is complaining of some trace, scattered tiny insect bites to the inside of her bilateral thighs.  She states that the R somewhat pruritic; but there is no pain associated.  Patient was advised to try Benadryl 25 mg every 6 hours and Pepcid 20 mg every 12 hours to see  if that helps.  She may also try hydrocortisone to the rash.  Patient was encouraged to call/return or go directly to the emergency department for any worsening symptoms whatsoever.  Nausea without vomiting Patient is complaining of progressive, chronic nausea; but no vomiting.  She has been alternating Zofran and Compazine with only minimal effectiveness.  She states that she has been able to continue both eating and drinking.  However, she does admit to minimal appetite.  On exam.  Patient does not appear dehydrated or toxic.  If patient continues  with chronic complaint of nausea-may very well need to consider brain imaging.  Patient stated understanding of all instructions; and was in agreement with this plan of care. The patient knows to call the clinic with any problems, questions or concerns.   Review/collaboration with Dr. [name] regarding all aspects of patient's visit today.   Total time spent with patient was 40 minutes;  with greater than 75 percent of that time spent in face to face counseling regarding patient's symptoms,  and coordination of care and follow up.  Disclaimer: This note was dictated with voice recognition software. Similar sounding words can inadvertently be transcribed and may not be corrected upon review.   Drue Second, NP 08/08/2014

## 2014-08-08 NOTE — Assessment & Plan Note (Signed)
Patient was treated for severe hypocalcemia while in the emergency department recently.  Corrected calcium today was 7.98.  Patient confirmed that she continues to take 3 times per day as directed.

## 2014-08-08 NOTE — Assessment & Plan Note (Signed)
Patient is complaining of minimal appetite.  Patient was encouraged to eat multiple small meals throughout the day and to push fluids.

## 2014-08-08 NOTE — Assessment & Plan Note (Signed)
Patient is complaining of some trace, scattered tiny insect bites to the inside of her bilateral thighs.  She states that the R somewhat pruritic; but there is no pain associated.  Patient was advised to try Benadryl 25 mg every 6 hours and Pepcid 20 mg every 12 hours to see if that helps.  She may also try hydrocortisone to the rash.  Patient was encouraged to call/return or go directly to the emergency department for any worsening symptoms whatsoever.

## 2014-08-10 ENCOUNTER — Emergency Department (HOSPITAL_BASED_OUTPATIENT_CLINIC_OR_DEPARTMENT_OTHER): Payer: Medicare Other

## 2014-08-10 ENCOUNTER — Inpatient Hospital Stay (HOSPITAL_BASED_OUTPATIENT_CLINIC_OR_DEPARTMENT_OTHER)
Admission: EM | Admit: 2014-08-10 | Discharge: 2014-08-12 | DRG: 641 | Disposition: A | Discharge status: Home / Self Care | Payer: Medicare Other | Attending: Internal Medicine | Admitting: Internal Medicine

## 2014-08-10 ENCOUNTER — Encounter (HOSPITAL_BASED_OUTPATIENT_CLINIC_OR_DEPARTMENT_OTHER): Payer: Self-pay | Admitting: *Deleted

## 2014-08-10 ENCOUNTER — Other Ambulatory Visit (HOSPITAL_BASED_OUTPATIENT_CLINIC_OR_DEPARTMENT_OTHER): Payer: Self-pay

## 2014-08-10 ENCOUNTER — Other Ambulatory Visit: Payer: Self-pay | Admitting: Family Medicine

## 2014-08-10 ENCOUNTER — Telehealth: Payer: Self-pay | Admitting: *Deleted

## 2014-08-10 ENCOUNTER — Encounter: Payer: Self-pay | Admitting: Family Medicine

## 2014-08-10 ENCOUNTER — Other Ambulatory Visit: Payer: Self-pay

## 2014-08-10 ENCOUNTER — Ambulatory Visit (INDEPENDENT_AMBULATORY_CARE_PROVIDER_SITE_OTHER): Payer: Medicare Other | Admitting: Family Medicine

## 2014-08-10 VITALS — BP 125/69 | HR 76 | Resp 16 | Ht 63.0 in | Wt 187.8 lb

## 2014-08-10 DIAGNOSIS — Z7902 Long term (current) use of antithrombotics/antiplatelets: Secondary | ICD-10-CM | POA: Diagnosis not present

## 2014-08-10 DIAGNOSIS — E119 Type 2 diabetes mellitus without complications: Secondary | ICD-10-CM | POA: Diagnosis not present

## 2014-08-10 DIAGNOSIS — C50919 Malignant neoplasm of unspecified site of unspecified female breast: Secondary | ICD-10-CM | POA: Diagnosis not present

## 2014-08-10 DIAGNOSIS — Z79891 Long term (current) use of opiate analgesic: Secondary | ICD-10-CM | POA: Diagnosis not present

## 2014-08-10 DIAGNOSIS — I10 Essential (primary) hypertension: Secondary | ICD-10-CM | POA: Diagnosis present

## 2014-08-10 DIAGNOSIS — I2510 Atherosclerotic heart disease of native coronary artery without angina pectoris: Secondary | ICD-10-CM | POA: Diagnosis present

## 2014-08-10 DIAGNOSIS — Z91048 Other nonmedicinal substance allergy status: Secondary | ICD-10-CM | POA: Diagnosis not present

## 2014-08-10 DIAGNOSIS — E039 Hypothyroidism, unspecified: Secondary | ICD-10-CM

## 2014-08-10 DIAGNOSIS — M1990 Unspecified osteoarthritis, unspecified site: Secondary | ICD-10-CM | POA: Diagnosis present

## 2014-08-10 DIAGNOSIS — Z7982 Long term (current) use of aspirin: Secondary | ICD-10-CM | POA: Diagnosis not present

## 2014-08-10 DIAGNOSIS — D631 Anemia in chronic kidney disease: Secondary | ICD-10-CM | POA: Diagnosis present

## 2014-08-10 DIAGNOSIS — Z888 Allergy status to other drugs, medicaments and biological substances status: Secondary | ICD-10-CM | POA: Diagnosis not present

## 2014-08-10 DIAGNOSIS — G4733 Obstructive sleep apnea (adult) (pediatric): Secondary | ICD-10-CM | POA: Diagnosis present

## 2014-08-10 DIAGNOSIS — E876 Hypokalemia: Secondary | ICD-10-CM | POA: Diagnosis present

## 2014-08-10 DIAGNOSIS — E785 Hyperlipidemia, unspecified: Secondary | ICD-10-CM | POA: Diagnosis present

## 2014-08-10 DIAGNOSIS — Z833 Family history of diabetes mellitus: Secondary | ICD-10-CM | POA: Diagnosis not present

## 2014-08-10 DIAGNOSIS — Z8249 Family history of ischemic heart disease and other diseases of the circulatory system: Secondary | ICD-10-CM | POA: Diagnosis not present

## 2014-08-10 DIAGNOSIS — I129 Hypertensive chronic kidney disease with stage 1 through stage 4 chronic kidney disease, or unspecified chronic kidney disease: Secondary | ICD-10-CM | POA: Diagnosis present

## 2014-08-10 DIAGNOSIS — Z87440 Personal history of urinary (tract) infections: Secondary | ICD-10-CM | POA: Diagnosis not present

## 2014-08-10 DIAGNOSIS — Z91040 Latex allergy status: Secondary | ICD-10-CM | POA: Diagnosis not present

## 2014-08-10 DIAGNOSIS — E1122 Type 2 diabetes mellitus with diabetic chronic kidney disease: Secondary | ICD-10-CM | POA: Diagnosis present

## 2014-08-10 DIAGNOSIS — I509 Heart failure, unspecified: Secondary | ICD-10-CM

## 2014-08-10 DIAGNOSIS — I6523 Occlusion and stenosis of bilateral carotid arteries: Secondary | ICD-10-CM | POA: Diagnosis present

## 2014-08-10 DIAGNOSIS — Z885 Allergy status to narcotic agent status: Secondary | ICD-10-CM | POA: Diagnosis not present

## 2014-08-10 DIAGNOSIS — Z9011 Acquired absence of right breast and nipple: Secondary | ICD-10-CM | POA: Diagnosis present

## 2014-08-10 DIAGNOSIS — Z923 Personal history of irradiation: Secondary | ICD-10-CM | POA: Diagnosis not present

## 2014-08-10 DIAGNOSIS — R600 Localized edema: Secondary | ICD-10-CM

## 2014-08-10 DIAGNOSIS — Z6833 Body mass index (BMI) 33.0-33.9, adult: Secondary | ICD-10-CM | POA: Diagnosis not present

## 2014-08-10 DIAGNOSIS — C7951 Secondary malignant neoplasm of bone: Secondary | ICD-10-CM | POA: Diagnosis present

## 2014-08-10 DIAGNOSIS — Z79899 Other long term (current) drug therapy: Secondary | ICD-10-CM | POA: Diagnosis not present

## 2014-08-10 DIAGNOSIS — D630 Anemia in neoplastic disease: Secondary | ICD-10-CM | POA: Diagnosis present

## 2014-08-10 DIAGNOSIS — Z803 Family history of malignant neoplasm of breast: Secondary | ICD-10-CM | POA: Diagnosis not present

## 2014-08-10 DIAGNOSIS — Z9889 Other specified postprocedural states: Secondary | ICD-10-CM | POA: Diagnosis not present

## 2014-08-10 DIAGNOSIS — C50911 Malignant neoplasm of unspecified site of right female breast: Secondary | ICD-10-CM | POA: Diagnosis present

## 2014-08-10 DIAGNOSIS — E118 Type 2 diabetes mellitus with unspecified complications: Secondary | ICD-10-CM

## 2014-08-10 DIAGNOSIS — F419 Anxiety disorder, unspecified: Secondary | ICD-10-CM | POA: Diagnosis present

## 2014-08-10 DIAGNOSIS — F329 Major depressive disorder, single episode, unspecified: Secondary | ICD-10-CM | POA: Diagnosis present

## 2014-08-10 DIAGNOSIS — Z853 Personal history of malignant neoplasm of breast: Secondary | ICD-10-CM

## 2014-08-10 DIAGNOSIS — E669 Obesity, unspecified: Secondary | ICD-10-CM | POA: Diagnosis present

## 2014-08-10 DIAGNOSIS — Z794 Long term (current) use of insulin: Secondary | ICD-10-CM | POA: Diagnosis not present

## 2014-08-10 DIAGNOSIS — N182 Chronic kidney disease, stage 2 (mild): Secondary | ICD-10-CM | POA: Diagnosis present

## 2014-08-10 DIAGNOSIS — K219 Gastro-esophageal reflux disease without esophagitis: Secondary | ICD-10-CM | POA: Diagnosis present

## 2014-08-10 LAB — CBC
HCT: 30 % — ABNORMAL LOW (ref 36.0–46.0)
HEMOGLOBIN: 9.8 g/dL — AB (ref 12.0–15.0)
MCH: 28.6 pg (ref 26.0–34.0)
MCHC: 32.7 g/dL (ref 30.0–36.0)
MCV: 87.5 fL (ref 78.0–100.0)
Platelets: 216 10*3/uL (ref 150–400)
RBC: 3.43 MIL/uL — ABNORMAL LOW (ref 3.87–5.11)
RDW: 14.6 % (ref 11.5–15.5)
WBC: 5.7 10*3/uL (ref 4.0–10.5)

## 2014-08-10 LAB — COMPREHENSIVE METABOLIC PANEL
ALT: 15 U/L (ref 14–54)
ANION GAP: 15 (ref 5–15)
AST: 28 U/L (ref 15–41)
Albumin: 3 g/dL — ABNORMAL LOW (ref 3.5–5.0)
Alkaline Phosphatase: 85 U/L (ref 38–126)
BUN: 24 mg/dL — ABNORMAL HIGH (ref 6–20)
CALCIUM: 6.1 mg/dL — AB (ref 8.9–10.3)
CO2: 23 mmol/L (ref 22–32)
CREATININE: 1.53 mg/dL — AB (ref 0.44–1.00)
Chloride: 96 mmol/L — ABNORMAL LOW (ref 101–111)
GFR calc Af Amer: 38 mL/min — ABNORMAL LOW (ref >60)
GFR calc non Af Amer: 33 mL/min — ABNORMAL LOW (ref >60)
Glucose, Bld: 257 mg/dL — ABNORMAL HIGH (ref 65–99)
Potassium: 2.9 mmol/L — ABNORMAL LOW (ref 3.5–5.1)
SODIUM: 134 mmol/L — AB (ref 135–145)
Total Bilirubin: 0.8 mg/dL (ref 0.3–1.2)
Total Protein: 7.3 g/dL (ref 6.5–8.1)

## 2014-08-10 LAB — BASIC METABOLIC PANEL
BUN: 20 mg/dL (ref 6–23)
CO2: 21 mEq/L (ref 19–32)
CREATININE: 1.34 mg/dL — AB (ref 0.50–1.10)
Calcium: 6.1 mg/dL — CL (ref 8.4–10.5)
Chloride: 95 mEq/L — ABNORMAL LOW (ref 96–112)
GLUCOSE: 233 mg/dL — AB (ref 70–99)
POTASSIUM: 3.3 meq/L — AB (ref 3.5–5.3)
SODIUM: 137 meq/L (ref 135–145)

## 2014-08-10 LAB — CBC WITH DIFFERENTIAL/PLATELET
BASOS ABS: 0 10*3/uL (ref 0.0–0.1)
BASOS PCT: 1 % (ref 0–1)
Eosinophils Absolute: 0.1 10*3/uL (ref 0.0–0.7)
Eosinophils Relative: 1 % (ref 0–5)
HCT: 28.3 % — ABNORMAL LOW (ref 36.0–46.0)
Hemoglobin: 9.2 g/dL — ABNORMAL LOW (ref 12.0–15.0)
Lymphocytes Relative: 24 % (ref 12–46)
Lymphs Abs: 1.6 10*3/uL (ref 0.7–4.0)
MCH: 28.8 pg (ref 26.0–34.0)
MCHC: 32.5 g/dL (ref 30.0–36.0)
MCV: 88.7 fL (ref 78.0–100.0)
MONO ABS: 0.8 10*3/uL (ref 0.1–1.0)
MONOS PCT: 12 % (ref 3–12)
NEUTROS ABS: 4.1 10*3/uL (ref 1.7–7.7)
Neutrophils Relative %: 63 % (ref 43–77)
PLATELETS: 216 10*3/uL (ref 150–400)
RBC: 3.19 MIL/uL — ABNORMAL LOW (ref 3.87–5.11)
RDW: 14.5 % (ref 11.5–15.5)
WBC: 6.6 10*3/uL (ref 4.0–10.5)

## 2014-08-10 LAB — TSH: TSH: 4.654 u[IU]/mL — ABNORMAL HIGH (ref 0.350–4.500)

## 2014-08-10 LAB — BRAIN NATRIURETIC PEPTIDE: B Natriuretic Peptide: 354.4 pg/mL — ABNORMAL HIGH (ref 0.0–100.0)

## 2014-08-10 LAB — GLUCOSE, CAPILLARY: Glucose-Capillary: 185 mg/dL — ABNORMAL HIGH (ref 65–99)

## 2014-08-10 LAB — TROPONIN I: Troponin I: <0.03 ng/mL (ref <0.031)

## 2014-08-10 MED ORDER — CARVEDILOL 25 MG PO TABS
25.0000 mg | ORAL_TABLET | Freq: Two times a day (BID) | ORAL | Status: DC
  Administered 2014-08-11 – 2014-08-12 (×4): 25 mg via ORAL
  Filled 2014-08-10 (×4): qty 1

## 2014-08-10 MED ORDER — ONDANSETRON HCL 4 MG/2ML IJ SOLN
4.0000 mg | Freq: Four times a day (QID) | INTRAMUSCULAR | Status: DC | PRN
  Administered 2014-08-11: 4 mg via INTRAVENOUS
  Filled 2014-08-10: qty 2

## 2014-08-10 MED ORDER — AMLODIPINE BESYLATE 10 MG PO TABS
10.0000 mg | ORAL_TABLET | Freq: Every day | ORAL | Status: DC
  Administered 2014-08-11 – 2014-08-12 (×2): 10 mg via ORAL
  Filled 2014-08-10 (×2): qty 1

## 2014-08-10 MED ORDER — ATORVASTATIN CALCIUM 80 MG PO TABS
80.0000 mg | ORAL_TABLET | Freq: Every day | ORAL | Status: DC
  Administered 2014-08-11 – 2014-08-12 (×2): 80 mg via ORAL
  Filled 2014-08-10 (×2): qty 1

## 2014-08-10 MED ORDER — PROCHLORPERAZINE MALEATE 10 MG PO TABS: 10.0000 mg | ORAL_TABLET | Freq: Four times a day (QID) | ORAL | Status: DC | PRN

## 2014-08-10 MED ORDER — CLOPIDOGREL BISULFATE 75 MG PO TABS
75.0000 mg | ORAL_TABLET | Freq: Every day | ORAL | Status: DC
  Administered 2014-08-11 – 2014-08-12 (×2): 75 mg via ORAL
  Filled 2014-08-10 (×2): qty 1

## 2014-08-10 MED ORDER — DARBEPOETIN ALFA 200 MCG/ML IJ SOLN: 200.0000 ug | INTRAMUSCULAR | Status: DC

## 2014-08-10 MED ORDER — MORPHINE SULFATE ER 30 MG PO TBCR
30.0000 mg | EXTENDED_RELEASE_TABLET | Freq: Two times a day (BID) | ORAL | Status: DC
  Administered 2014-08-10 – 2014-08-12 (×4): 30 mg via ORAL
  Filled 2014-08-10 (×4): qty 1

## 2014-08-10 MED ORDER — ONDANSETRON HCL 4 MG PO TABS: 4.0000 mg | ORAL_TABLET | Freq: Four times a day (QID) | ORAL | Status: DC | PRN

## 2014-08-10 MED ORDER — GABAPENTIN 100 MG PO CAPS
100.0000 mg | ORAL_CAPSULE | Freq: Three times a day (TID) | ORAL | Status: DC
  Administered 2014-08-10 – 2014-08-12 (×6): 100 mg via ORAL
  Filled 2014-08-10 (×7): qty 1

## 2014-08-10 MED ORDER — PANTOPRAZOLE SODIUM 40 MG PO TBEC
40.0000 mg | DELAYED_RELEASE_TABLET | Freq: Every day | ORAL | Status: DC
  Administered 2014-08-11 – 2014-08-12 (×2): 40 mg via ORAL
  Filled 2014-08-10 (×3): qty 1

## 2014-08-10 MED ORDER — FUROSEMIDE 10 MG/ML IJ SOLN
40.0000 mg | Freq: Once | INTRAMUSCULAR | Status: AC
  Administered 2014-08-10: 40 mg via INTRAVENOUS
  Filled 2014-08-10: qty 4

## 2014-08-10 MED ORDER — LEVOTHYROXINE SODIUM 137 MCG PO TABS
137.0000 ug | ORAL_TABLET | Freq: Every day | ORAL | Status: DC
  Administered 2014-08-11 – 2014-08-12 (×2): 137 ug via ORAL
  Filled 2014-08-10 (×2): qty 1

## 2014-08-10 MED ORDER — ACETAMINOPHEN 325 MG PO TABS
650.0000 mg | ORAL_TABLET | Freq: Once | ORAL | Status: AC
  Administered 2014-08-10: 650 mg via ORAL
  Filled 2014-08-10: qty 2

## 2014-08-10 MED ORDER — CALCIUM CARBONATE ANTACID 500 MG PO CHEW
1.0000 | CHEWABLE_TABLET | Freq: Three times a day (TID) | ORAL | Status: DC
  Administered 2014-08-10 – 2014-08-12 (×6): 200 mg via ORAL
  Filled 2014-08-10 (×11): qty 1

## 2014-08-10 MED ORDER — CYANOCOBALAMIN 1000 MCG/ML IJ SOLN: 1000.0000 ug | INTRAMUSCULAR | Status: DC

## 2014-08-10 MED ORDER — HYDROCHLOROTHIAZIDE 25 MG PO TABS
25.0000 mg | ORAL_TABLET | Freq: Every day | ORAL | Status: DC
  Administered 2014-08-11 – 2014-08-12 (×2): 25 mg via ORAL
  Filled 2014-08-10 (×2): qty 1

## 2014-08-10 MED ORDER — SODIUM CHLORIDE 0.9 % IV SOLN
Freq: Once | INTRAVENOUS | Status: AC
  Administered 2014-08-10: 20:00:00 via INTRAVENOUS

## 2014-08-10 MED ORDER — SODIUM CHLORIDE 0.9 % IV SOLN
1.0000 g | Freq: Once | INTRAVENOUS | Status: AC
  Administered 2014-08-10: 1 g via INTRAVENOUS
  Filled 2014-08-10: qty 10

## 2014-08-10 MED ORDER — POLYETHYLENE GLYCOL 3350 17 G PO PACK: 17.0000 g | PACK | Freq: Every day | ORAL | Status: DC | PRN

## 2014-08-10 MED ORDER — ISOSORBIDE MONONITRATE ER 60 MG PO TB24
120.0000 mg | ORAL_TABLET | Freq: Two times a day (BID) | ORAL | Status: DC
  Administered 2014-08-10 – 2014-08-12 (×4): 120 mg via ORAL
  Filled 2014-08-10 (×5): qty 2

## 2014-08-10 MED ORDER — NITROGLYCERIN 0.4 MG SL SUBL: 0.4000 mg | SUBLINGUAL_TABLET | SUBLINGUAL | Status: DC | PRN

## 2014-08-10 MED ORDER — ENOXAPARIN SODIUM 40 MG/0.4ML ~~LOC~~ SOLN
40.0000 mg | Freq: Every day | SUBCUTANEOUS | Status: DC
  Administered 2014-08-10 – 2014-08-11 (×2): 40 mg via SUBCUTANEOUS
  Filled 2014-08-10 (×3): qty 0.4

## 2014-08-10 MED ORDER — EXEMESTANE 25 MG PO TABS
25.0000 mg | ORAL_TABLET | Freq: Every day | ORAL | Status: DC
  Administered 2014-08-11 – 2014-08-12 (×2): 25 mg via ORAL
  Filled 2014-08-10 (×4): qty 1

## 2014-08-10 MED ORDER — EVEROLIMUS 5 MG PO TABS
5.0000 mg | ORAL_TABLET | Freq: Every day | ORAL | Status: DC
  Administered 2014-08-12: 5 mg via ORAL
  Filled 2014-08-10: qty 1

## 2014-08-10 MED ORDER — ASPIRIN 81 MG PO CHEW
81.0000 mg | CHEWABLE_TABLET | Freq: Every day | ORAL | Status: DC
  Administered 2014-08-11 – 2014-08-12 (×2): 81 mg via ORAL
  Filled 2014-08-10 (×2): qty 1

## 2014-08-10 MED ORDER — GLYCERIN (LAXATIVE) 2 G RE SUPP
1.0000 | Freq: Once | RECTAL | Status: AC | PRN
  Filled 2014-08-10: qty 1

## 2014-08-10 MED ORDER — POTASSIUM CHLORIDE IN NACL 40-0.9 MEQ/L-% IV SOLN
INTRAVENOUS | Status: DC
  Administered 2014-08-10 – 2014-08-12 (×3): 75 mL/h via INTRAVENOUS
  Filled 2014-08-10 (×5): qty 1000

## 2014-08-10 MED ORDER — SENNOSIDES-DOCUSATE SODIUM 8.6-50 MG PO TABS
1.0000 | ORAL_TABLET | Freq: Every day | ORAL | Status: DC
  Administered 2014-08-11: 1 via ORAL
  Filled 2014-08-10: qty 1

## 2014-08-10 NOTE — Telephone Encounter (Signed)
Kim from [facility] called with critical lab results for pt. Calcium: 6.1 Per Dr. Anitra Lauth pt needs to stop hctz and go to [facility] for IV Calcium. Pt advised and voiced understanding.

## 2014-08-10 NOTE — ED Notes (Signed)
Pt c/o HA, EDP notified, orders rec

## 2014-08-10 NOTE — ED Notes (Signed)
Restricted Extremity bracelet placed on Rt arm due to Port in Rt subclavin area for Chemo Tx

## 2014-08-10 NOTE — ED Notes (Signed)
POX consitently at 87% on RA, pt placed on [place] at 2lpm , POX increased to 94-95%

## 2014-08-10 NOTE — ED Notes (Signed)
Pt husband and daughter request called with bed placement at [phone] and [phone] respectively. Both unable to drive at night and departed for home prior to placement.

## 2014-08-10 NOTE — ED Notes (Signed)
Pt denies any cramping, but having some Nausea at times, poor appetite

## 2014-08-10 NOTE — Progress Notes (Signed)
OFFICE VISIT  08/10/2014   CC:  Chief Complaint  Patient presents with  . Follow-up    dm2 and b/l lower extreamities edema.  had ED visit within the last week.   HPI:    Patient is a 74 y.o. Caucasian female who presents for 2 mo f/u DM 2, hypothyroidism, recent hypocalcemia.  I started her on insulin a month ago.   Has been compliant with insulin, has adjusted dosing down some the last week at least due to feeling bad and poor appetite/poor PO intake.  Nothing eaten this morning.  No insulin taken but she took the remainder of her meds.  Started feeling bad when calcium dropped, beginnin 6/14, and it got its lowest on 08/02/14 at 6.0.  She was then given IV calcium by EDP, plus chewing $RemoveBefo'750mg'flHXyZzMojT$  tums tid.  Level was back up to 7.1 (corrects to 7.6) on 08/07/14. She still feels bad: no appetite or energy, some nausea w/out vomiting.  She is currently on an antineoplastic med that can cause hypocalcemia (everolimus) and she got a SQ injection of XGEVA on 07/14/14--a more likely cause of her hypocalcemia.  LE swelling increased some with all the fluids and a transfusion received in the last week.  She says it has been improving in last 3d.  Past Medical History  Diagnosis Date  . 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 Depression   . GERD (gastroesophageal reflux disease)   . Hyperlipidemia   . Hypothyroidism   . Osteoarthritis   . Carotid artery stenosis     bilateral  . CAD (coronary artery disease)     medical therapy, old tot RCA, patent LAD, mod. severe ostial LCX stenosis--PCI would be too complex-med mgmt recommended.  . OSA (obstructive sleep apnea)   . Hypertension   . Anxiety   . Carotid artery occlusion   . Unspecified constipation 03/15/2012  . Bursitis   . Collagen vascular disease   . History of radiation therapy 10/31/12-11/13/12    rt&lt humerous 30Gy/44fx  . Valvular heart disease 07/11/2013  . Diarrhea 09/28/2013  . Chronic renal  insufficiency, stage II (mild)     Borderline stage II/III, CrCl 50s/60s.  . History of blood transfusion   . Urinary tract bacterial infections   . 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 Diabetes mellitus type II     insulin    Past Surgical History  Procedure Laterality Date  . Incisional breast biopsy      remoted left breast biopsy  . Cesarean section    . Other surgical history      GYN surgery  . Knee surgery      right knee surgery  . Carotid endarterectomy  2011    right carotid  . Port-a-cath removal    . Modified mastectomy      Right breast  . Breast surgery    . Mastectomy    . Carotid stent      left  . Cardiac catheterization  06/2013    old total occ. of RCA, moderately severe ostial LCX stenosis- medical therapy- would be complex PCI  . Carotid angiogram N/A 03/01/2011    Procedure: CAROTID ANGIOGRAM;  Surgeon: Conrad Zebulon, MD;  Location: [facility] CATH LAB;  Service: Cardiovascular;  Laterality: N/A;  . Arch aortogram  03/01/2011    Procedure: ARCH AORTOGRAM;  Surgeon: Jannette Fogo  Bridgett Larsson, MD;  Location: [facility] CATH LAB;  Service: Cardiovascular;;  . Carotid stent insertion Left 03/15/2011    Procedure: CAROTID STENT INSERTION;  Surgeon: Serafina Mitchell, MD;  Location: [facility] CATH LAB;  Service: Cardiovascular;  Laterality: Left;  . Left heart catheterization with coronary angiogram N/A 07/07/2013    Procedure: LEFT HEART CATHETERIZATION WITH CORONARY ANGIOGRAM;  Surgeon: Blane Ohara, MD;  Location: [facility] CATH LAB;  Service: Cardiovascular;  Laterality: N/A;  . Carotid dopplers  05/2014    Patent ICAs, bilat ECA dz: no change compared to 2014    Outpatient Prescriptions Prior to Visit  Medication Sig Dispense Refill  . amLODipine (NORVASC) 10 MG tablet TAKE ONE TABLET BY MOUTH EVERY DAY 30 tablet 6  . aspirin 81 MG chewable tablet Chew 81 mg by mouth daily.    Marland Kitchen atorvastatin (LIPITOR) 80 MG tablet Take 1 tablet (80  mg total) by mouth daily. 30 tablet 11  . Blood Glucose Monitoring Suppl (ACCU-CHEK NANO SMARTVIEW) W/DEVICE KIT Use to check blood sugar twice a day. DX 250.00 1 kit 0  . calcium carbonate (TUMS) 500 MG chewable tablet Chew 1 tablet (200 mg of elemental calcium total) by mouth 3 (three) times daily with meals. 30 tablet 0  . carvedilol (COREG) 25 MG tablet Take 1 tablet (25 mg total) by mouth 2 (two) times daily with a meal. 60 tablet 6  . clopidogrel (PLAVIX) 75 MG tablet Take 1 tablet (75 mg total) by mouth daily. 30 tablet 8  . cyanocobalamin (,VITAMIN B-12,) 1000 MCG/ML injection Inject 1,000 mcg into the muscle every 30 (thirty) days.     . darbepoetin (ARANESP) 200 MCG/0.4ML SOLN Inject 200 mcg into the skin every 14 (fourteen) days.     Marland Kitchen everolimus (AFINITOR) 5 MG tablet Take 1 tablet (5 mg total) by mouth daily. 30 tablet 6  . exemestane (AROMASIN) 25 MG tablet Take 1 tablet (25 mg total) by mouth daily after breakfast. 30 tablet 2  . gabapentin (NEURONTIN) 100 MG capsule Take 1 capsule (100 mg total) by mouth 3 (three) times daily. 90 capsule 2  . glucose blood (ACCU-CHEK AVIVA) test strip Use as instructed to check blood sugar twice a day  Dx 250.00 100 each 3  . glycerin adult (GLYCERIN ADULT) 2 G SUPP Place 1 suppository rectally once as needed (constipation). 10 suppository 0  . hydrochlorothiazide (MICROZIDE) 12.5 MG capsule Take 2 capsules (25 mg total) by mouth daily. 60 capsule 5  . Insulin Isophane & Regular Human (HUMULIN 70/30 MIX) (70-30) 100 UNIT/ML PEN 20 U sub Q qAM and 10 U subQ qPM 15 mL 11  . isosorbide mononitrate (IMDUR) 60 MG 24 hr tablet Take 2 tablets (120 mg total) by mouth daily. (Patient taking differently: Take 120 mg by mouth 2 (two) times daily. ) 60 tablet 11  . levothyroxine (SYNTHROID, LEVOTHROID) 137 MCG tablet Take 1 tablet (137 mcg total) by mouth daily before breakfast. 30 tablet 2  . morphine (MS CONTIN) 30 MG 12 hr tablet Take 1 tablet (30 mg total) by  mouth 2 (two) times daily. 60 tablet 0  . nitroGLYCERIN (NITROSTAT) 0.4 MG SL tablet Place 1 tablet (0.4 mg total) under the tongue every 5 (five) minutes as needed for chest pain. 30 tablet 0  . omeprazole (PRILOSEC) 40 MG capsule Take 1 capsule (40 mg total) by mouth daily. 30 capsule 3  . polyethylene glycol (MIRALAX / GLYCOLAX) packet Take 17 g by mouth daily as needed for  mild constipation.     . prochlorperazine (COMPAZINE) 10 MG tablet Take 1 tablet (10 mg total) by mouth every 6 (six) hours as needed for nausea or vomiting. 30 tablet 1  . senna-docusate (SENOKOT-S) 8.6-50 MG per tablet Take 1 tablet by mouth daily.     No facility-administered medications prior to visit.    Allergies  Allergen Reactions  . Chlorhexidine Hives, Itching and Rash    This was most likely a CONTACT DERMATITIS versus true systemic allergic reaction  . Adhesive [Tape] Hives  . Codeine Swelling  . Latex     ROS As per HPI  PE: Blood pressure 125/69, pulse 76, resp. rate 16, height $RemoveBe'5\' 3"'BLRgxWzIP$  (1.6 m), weight 187 lb 12.8 oz (85.186 kg), last menstrual period 03/13/2012, SpO2 90 %. Gen: Alert, well appearing.  Patient is oriented to person, place, time, and situation. AFFECT: pleasant, lucid thought and speech. I can't detect any latent tetany. CV: RRR, no m/r/g.   LUNGS: CTA bilat, nonlabored resps, good aeration in all lung fields. EXT: 2+ pitting in both LL's.  LABS:   Lab Results  Component Value Date   HGBA1C 7.9* 06/10/2014    Lab Results  Component Value Date   TSH 11.47* 06/10/2014   Lab Results  Component Value Date   WBC 5.7 08/07/2014   HGB 10.4* 08/07/2014   HCT 31.3* 08/07/2014   MCV 88.2 08/07/2014   PLT 188 08/07/2014   Lab Results  Component Value Date   CREATININE 1.1 08/07/2014   BUN 17.3 08/07/2014   NA 136 08/07/2014   K 3.5 08/07/2014   CL 102 08/02/2014   CO2 22 08/07/2014   Lab Results  Component Value Date   ALT 19 08/07/2014   AST 27 08/07/2014   ALKPHOS  117 08/07/2014   BILITOT 0.86 08/07/2014   Lab Results  Component Value Date   CHOL 134 03/02/2014   Lab Results  Component Value Date   HDL 27.70* 03/02/2014   Lab Results  Component Value Date   LDLCALC 76 03/02/2014   Lab Results  Component Value Date   TRIG 151.0* 03/02/2014   Lab Results  Component Value Date   CHOLHDL 5 03/02/2014    IMPRESSION AND PLAN:  1) Recent severe hypocalcemia; likely secondary to everolimus + XGEVA injection. Recheck BMET today stat.  Continue oral calcium $RemoveBefo'750mg'HmQNxZJlHsN$  tid.  2) DM 2, doing ok with insulin start-up over the last month. Recheck A1c in 26mo.  3) Hypothyroidism: due for TSH recheck.  Most recent dose change was UP to 137 mcg qd back in May 2016 when her TSH was 11.47.  4) Bilat lower extremity edema: improving.  Chronic venous insufficiency with recent mild fluid excess by IVF and RBC transfusion. No new meds at this time.  An After Visit Summary was printed and given to the patient.  FOLLOW UP: 2 wks  ADDENDUM 08/10/14: pt's calcium returned 6.1. We called pt and recommended she go to the nearest ED for IV calcium treatment.--PM

## 2014-08-10 NOTE — ED Provider Notes (Signed)
CSN: [id number]     Arrival date & time 08/10/14  1615 History  This chart was scribed for Brittney Speak, MD by Helane Gunther, ED Scribe. This patient was seen in room MH04/MH04 and the patient's care was started at 5:20 PM.    Chief Complaint  Patient presents with  . Abnormal Lab   The history is provided by the patient. No language interpreter was used.   HPI Comments: Brittney Cunningham is a 74 y.o. female who presents to the Emergency Department complaining of an abnormal lab result of low calcium today. She has had a similar, previous episode on 08/07/2014 for which she went to the ED and was discharged. She went to her PCP for a follow-up blood test, who found that the issue had not resolved. She reports associated nausea, loss of appetite, extremely tired. She is on Hydrochlorozide 2pills/day. Pt is on a new cancer medication that has pulmonary side-effects. She reports that she has occasional congestion and cough, as well as SOB. She denies a fever.  Past Medical History  Diagnosis Date  . 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 Depression   . GERD (gastroesophageal reflux disease)   . Hyperlipidemia   . Hypothyroidism   . Osteoarthritis   . Carotid artery stenosis     bilateral  . CAD (coronary artery disease)     medical therapy, old tot RCA, patent LAD, mod. severe ostial LCX stenosis--PCI would be too complex-med mgmt recommended.  . OSA (obstructive sleep apnea)   . Hypertension   . Anxiety   . Carotid artery occlusion   . Unspecified constipation 03/15/2012  . Bursitis   . Collagen vascular disease   . History of radiation therapy 10/31/12-11/13/12    rt&lt humerous 30Gy/50fx  . Valvular heart disease 07/11/2013  . Diarrhea 09/28/2013  . Chronic renal insufficiency, stage II (mild)     Borderline stage II/III, CrCl 50s/60s.  . History of blood transfusion   . Urinary tract bacterial infections   . Breast cancer     right breast  cancer-invasive  ductal carcinoma (StageIV) (diffuse bone mets) 2016  . Diabetes mellitus type II     insulin   Past Surgical History  Procedure Laterality Date  . Incisional breast biopsy      remoted left breast biopsy  . Cesarean section    . Other surgical history      GYN surgery  . Knee surgery      right knee surgery  . Carotid endarterectomy  2011    right carotid  . Port-a-cath removal    . Modified mastectomy      Right breast  . Breast surgery    . Mastectomy    . Carotid stent      left  . Cardiac catheterization  06/2013    old total occ. of RCA, moderately severe ostial LCX stenosis- medical therapy- would be complex PCI  . Carotid angiogram N/A 03/01/2011    Procedure: CAROTID ANGIOGRAM;  Surgeon: Conrad Breda, MD;  Location: [facility] CATH LAB;  Service: Cardiovascular;  Laterality: N/A;  . Arch aortogram  03/01/2011    Procedure: ARCH AORTOGRAM;  Surgeon: Conrad [place], MD;  Location: [facility] CATH LAB;  Service: Cardiovascular;;  . Carotid stent insertion Left 03/15/2011    Procedure: CAROTID STENT INSERTION;  Surgeon: Serafina Mitchell, MD;  Location: [facility] CATH LAB;  Service: Cardiovascular;  Laterality: Left;  . Left heart  catheterization with coronary angiogram N/A 07/07/2013    Procedure: LEFT HEART CATHETERIZATION WITH CORONARY ANGIOGRAM;  Surgeon: Blane Ohara, MD;  Location: [facility] CATH LAB;  Service: Cardiovascular;  Laterality: N/A;  . Carotid dopplers  05/2014    Patent ICAs, bilat ECA dz: no change compared to 2014   Family History  Problem Relation Age of Onset  . Arthritis    . Coronary artery disease      first degree relative  . Stroke      first degree relative  . Diabetes Mother   . Heart disease Mother   . Hypertension Mother   . Cancer Mother   . Deep vein thrombosis Mother   . Colon cancer Neg Hx   . Stomach cancer Neg Hx   . Cancer Father     Pancreatic cancer  . Breast cancer Paternal Aunt 77   History  Substance Use Topics  . Smoking status:  Former Smoker -- 1.00 packs/day for 20 years    Types: Cigarettes    Quit date: 01/31/1988  . Smokeless tobacco: Never Used  . Alcohol Use: No   OB History    No data available     Review of Systems  Constitutional: Negative for fever.  HENT: Positive for congestion.   Respiratory: Positive for cough and shortness of breath.   All other systems reviewed and are negative.   Allergies  Chlorhexidine; Adhesive; Codeine; and Latex  Home Medications   Prior to Admission medications   Medication Sig Start Date End Date Taking? Authorizing Provider  ACCU-CHEK AVIVA PLUS test strip CHECK BLOOD SUGAR TWICE A DAY FOR DX:250.00 08/10/14   [name], MD  amLODipine (NORVASC) 10 MG tablet TAKE ONE TABLET BY MOUTH EVERY DAY 06/15/14   [name], MD  aspirin 81 MG chewable tablet Chew 81 mg by mouth daily.    Historical Provider, MD  atorvastatin (LIPITOR) 80 MG tablet Take 1 tablet (80 mg total) by mouth daily. 07/20/14   Peter M Martinique, MD  Blood Glucose Monitoring Suppl (ACCU-CHEK NANO SMARTVIEW) W/DEVICE KIT Use to check blood sugar twice a day. DX 250.00 03/28/13   Mosie Lukes, MD  calcium carbonate (TUMS) 500 MG chewable tablet Chew 1 tablet (200 mg of elemental calcium total) by mouth 3 (three) times daily with meals. 08/02/14   Brittney Speak, MD  carvedilol (COREG) 25 MG tablet Take 1 tablet (25 mg total) by mouth 2 (two) times daily with a meal. 02/19/14   Peter M Martinique, MD  clopidogrel (PLAVIX) 75 MG tablet Take 1 tablet (75 mg total) by mouth daily. 11/28/13   Serafina Mitchell, MD  cyanocobalamin (,VITAMIN B-12,) 1000 MCG/ML injection Inject 1,000 mcg into the muscle every 30 (thirty) days.     Historical Provider, MD  darbepoetin (ARANESP) 200 MCG/0.4ML SOLN Inject 200 mcg into the skin every 14 (fourteen) days.     Historical Provider, MD  everolimus (AFINITOR) 5 MG tablet Take 1 tablet (5 mg total) by mouth daily. 06/30/14   Chauncey Cruel, MD  exemestane (AROMASIN) 25 MG  tablet Take 1 tablet (25 mg total) by mouth daily after breakfast. 06/12/14   Laurie Panda, NP  gabapentin (NEURONTIN) 100 MG capsule Take 1 capsule (100 mg total) by mouth 3 (three) times daily. 04/06/14   Laurie Panda, NP  glycerin adult (GLYCERIN ADULT) 2 G SUPP Place 1 suppository rectally once as needed (constipation). 04/13/12   Julianne Rice, MD  hydrochlorothiazide (MICROZIDE)  12.5 MG capsule Take 2 capsules (25 mg total) by mouth daily. 04/08/14   Isaiah Serge, NP  Insulin Isophane & Regular Human (HUMULIN 70/30 MIX) (70-30) 100 UNIT/ML PEN 20 U sub Q qAM and 10 U subQ qPM 07/13/14   [name], MD  isosorbide mononitrate (IMDUR) 60 MG 24 hr tablet Take 2 tablets (120 mg total) by mouth daily. Patient taking differently: Take 120 mg by mouth 2 (two) times daily.  08/05/13   Isaiah Serge, NP  levothyroxine (SYNTHROID, LEVOTHROID) 137 MCG tablet Take 1 tablet (137 mcg total) by mouth daily before breakfast. 06/10/14   [name], MD  morphine (MS CONTIN) 30 MG 12 hr tablet Take 1 tablet (30 mg total) by mouth 2 (two) times daily. 07/08/14   Chauncey Cruel, MD  nitroGLYCERIN (NITROSTAT) 0.4 MG SL tablet Place 1 tablet (0.4 mg total) under the tongue every 5 (five) minutes as needed for chest pain. 07/08/13   Janece Canterbury, MD  omeprazole (PRILOSEC) 40 MG capsule Take 1 capsule (40 mg total) by mouth daily. 03/02/14   Mosie Lukes, MD  polyethylene glycol (MIRALAX / GLYCOLAX) packet Take 17 g by mouth daily as needed for mild constipation.     Historical Provider, MD  prochlorperazine (COMPAZINE) 10 MG tablet Take 1 tablet (10 mg total) by mouth every 6 (six) hours as needed for nausea or vomiting. 08/07/14   Susanne Borders, NP  senna-docusate (SENOKOT-S) 8.6-50 MG per tablet Take 1 tablet by mouth daily.    Historical Provider, MD   Triage Vitals:  BP 142/53 mmHg  Pulse 81  Temp(Src) 99.8 F (37.7 C) (Oral)  Resp 22  Wt 187 lb (84.823 kg)  SpO2 90%  LMP  03/13/2012 Physical Exam  Constitutional: She is oriented to person, place, and time. She appears well-developed and well-nourished. No distress.  HENT:  Head: Normocephalic and atraumatic.  Mouth/Throat: Oropharynx is clear and moist.  Eyes: Conjunctivae and EOM are normal. Pupils are equal, round, and reactive to light.  Neck: Normal range of motion. Neck supple. No tracheal deviation present.  Cardiovascular: Normal rate.   Pulmonary/Chest: Breath sounds normal. No respiratory distress.  Abdominal: Soft.  Musculoskeletal: Normal range of motion.  2+ Pitting Edema  Neurological: She is alert and oriented to person, place, and time.  Skin: Skin is warm and dry.  Psychiatric: She has a normal mood and affect. Her behavior is normal.  Nursing note and vitals reviewed.   ED Course  Procedures  DIAGNOSTIC STUDIES: Oxygen Saturation is 90% on RA, low by my interpretation.    COORDINATION OF CARE: 5:25 PM - Discussed plans to order a chest XR and other diagnostic studies. Pt advised of plan for treatment and pt agrees.  Labs Review Labs Reviewed  COMPREHENSIVE METABOLIC PANEL - Abnormal; Notable for the following:    Sodium 134 (*)    Potassium 2.9 (*)    Chloride 96 (*)    Glucose, Bld 257 (*)    BUN 24 (*)    Creatinine, Ser 1.53 (*)    Calcium 6.1 (*)    Albumin 3.0 (*)    GFR calc non Af Amer 33 (*)    GFR calc Af Amer 38 (*)    All other components within normal limits  CBC WITH DIFFERENTIAL/PLATELET - Abnormal; Notable for the following:    RBC 3.19 (*)    Hemoglobin 9.2 (*)    HCT 28.3 (*)    All other  components within normal limits  BRAIN NATRIURETIC PEPTIDE - Abnormal; Notable for the following:    B Natriuretic Peptide 354.4 (*)    All other components within normal limits  TROPONIN I    Imaging Review Dg Chest 2 View  08/10/2014   CLINICAL DATA:  Hypercalcemia with weakness, shortness breath and difficulty breathing. Initial encounter.  EXAM: CHEST  2 VIEW   COMPARISON:  Radiographs 04/13/2012.  CT 07/15/2014.  FINDINGS: The heart size and mediastinal contours are stable without definite adenopathy. There is diffuse atherosclerosis of the aorta and coronary arteries. There are new patchy bibasilar airspace opacities with small bilateral pleural effusions. Underlying extensive osseous metastatic disease is grossly stable without evidence of pathologic fracture. Right subclavian Port-A-Cath tip appears unchanged near the SVC right atrial junction.  IMPRESSION: New patchy bibasilar airspace opacities and small bilateral pleural effusions, suspicious for edema or atypical inflammation. The change from the CT of less than 1 month ago makes metastatic disease to the lungs unlikely. Diffuse osseous metastases are grossly stable.   Electronically Signed   By: Richardean Sale M.D.   On: 08/10/2014 18:14    ED ECG REPORT   Date: 08/11/2014  Rate: 80  Rhythm: normal sinus rhythm  QRS Axis: normal  Intervals: normal  ST/T Wave abnormalities: normal  Conduction Disutrbances:none  Narrative Interpretation:   Old EKG Reviewed: none available  I have personally reviewed the EKG tracing and agree with the computerized printout as noted.   MDM   Final diagnoses:  None    Patient presents with complaints of weakness. She has been dealing with a low calcium level for the past 2 weeks. This is been unimproved with Tums and calcium given at given in the ER. Her calcium today remains 6.1 and chest x-ray and BNP are reflective of overlying congestive heart failure. She is hypoxic off of oxygen and I believe will require admission. She was given IV calcium, Lasix, and will be admitted to the hospitalist service at [facility] long.  I personally performed the services described in this documentation, which was scribed in my presence. The recorded information has been reviewed and is accurate.      Brittney Speak, MD 08/11/14 1806

## 2014-08-10 NOTE — ED Notes (Signed)
Pt's initial O2 sat after walking in from lobby was 86%. Pt sts she has been SOB since she began having low Calcium levels. As pt sat in triage, O2 sat climbed to 92%.

## 2014-08-10 NOTE — ED Notes (Signed)
MD at bedside. 

## 2014-08-10 NOTE — Progress Notes (Signed)
Dr. Hal Hope page to inform of patient's arrival to unit. Awaiting treatment orders. Patient is stable, will continue to monitor patient.

## 2014-08-10 NOTE — ED Notes (Signed)
Pt was seen at PCP this am and had blood drawn. She was called and told that her calcium was low and she needed to come in for a calcium infusion.

## 2014-08-10 NOTE — ED Notes (Signed)
Patient O2 levels were at 84% switched fingers and O2 came up to 89%. Legrand Como RN notified.

## 2014-08-10 NOTE — ED Notes (Signed)
Pt departing via [facility] condition stable

## 2014-08-10 NOTE — ED Notes (Signed)
Pt states breathing easier since Oxygen initiated

## 2014-08-10 NOTE — ED Notes (Signed)
Patient transported to X-ray via stretcher, sr x 2 up

## 2014-08-10 NOTE — ED Notes (Signed)
EDP notified of K and Ca level results

## 2014-08-10 NOTE — ED Notes (Signed)
Daughter Alyse Low contacted with room information.

## 2014-08-10 NOTE — ED Notes (Signed)
Pt presents stating " I am having problems with low calcium" was sent by primary care MD for evaluation and care

## 2014-08-11 ENCOUNTER — Encounter (HOSPITAL_COMMUNITY): Payer: Self-pay | Admitting: Internal Medicine

## 2014-08-11 ENCOUNTER — Ambulatory Visit: Payer: Self-pay | Admitting: Nurse Practitioner

## 2014-08-11 ENCOUNTER — Ambulatory Visit: Payer: Self-pay

## 2014-08-11 ENCOUNTER — Other Ambulatory Visit: Payer: Self-pay

## 2014-08-11 LAB — PHOSPHORUS: Phosphorus: 1.4 mg/dL — ABNORMAL LOW (ref 2.5–4.6)

## 2014-08-11 LAB — CBC WITH DIFFERENTIAL/PLATELET
BASOS PCT: 0 % (ref 0–1)
Basophils Absolute: 0 10*3/uL (ref 0.0–0.1)
Eosinophils Absolute: 0.1 10*3/uL (ref 0.0–0.7)
Eosinophils Relative: 2 % (ref 0–5)
HEMATOCRIT: 25.6 % — AB (ref 36.0–46.0)
Hemoglobin: 8.4 g/dL — ABNORMAL LOW (ref 12.0–15.0)
Lymphocytes Relative: 26 % (ref 12–46)
Lymphs Abs: 1.3 10*3/uL (ref 0.7–4.0)
MCH: 28.3 pg (ref 26.0–34.0)
MCHC: 32.8 g/dL (ref 30.0–36.0)
MCV: 86.2 fL (ref 78.0–100.0)
Monocytes Absolute: 0.6 10*3/uL (ref 0.1–1.0)
Monocytes Relative: 12 % (ref 3–12)
NEUTROS PCT: 60 % (ref 43–77)
Neutro Abs: 2.9 10*3/uL (ref 1.7–7.7)
Platelets: 178 10*3/uL (ref 150–400)
RBC: 2.97 MIL/uL — ABNORMAL LOW (ref 3.87–5.11)
RDW: 14.7 % (ref 11.5–15.5)
WBC: 4.9 10*3/uL (ref 4.0–10.5)

## 2014-08-11 LAB — COMPREHENSIVE METABOLIC PANEL
ALK PHOS: 82 U/L (ref 38–126)
ALT: 13 U/L — ABNORMAL LOW (ref 14–54)
AST: 24 U/L (ref 15–41)
Albumin: 2.8 g/dL — ABNORMAL LOW (ref 3.5–5.0)
Anion gap: 12 (ref 5–15)
BUN: 21 mg/dL — ABNORMAL HIGH (ref 6–20)
CALCIUM: 6.1 mg/dL — AB (ref 8.9–10.3)
CO2: 25 mmol/L (ref 22–32)
Chloride: 97 mmol/L — ABNORMAL LOW (ref 101–111)
Creatinine, Ser: 1.31 mg/dL — ABNORMAL HIGH (ref 0.44–1.00)
GFR calc non Af Amer: 40 mL/min — ABNORMAL LOW (ref >60)
GFR, EST AFRICAN AMERICAN: 46 mL/min — AB (ref >60)
Glucose, Bld: 233 mg/dL — ABNORMAL HIGH (ref 65–99)
Potassium: 2.8 mmol/L — ABNORMAL LOW (ref 3.5–5.1)
Sodium: 134 mmol/L — ABNORMAL LOW (ref 135–145)
Total Bilirubin: 1 mg/dL (ref 0.3–1.2)
Total Protein: 6.6 g/dL (ref 6.5–8.1)

## 2014-08-11 LAB — GLUCOSE, CAPILLARY
GLUCOSE-CAPILLARY: 194 mg/dL — AB (ref 65–99)
Glucose-Capillary: 205 mg/dL — ABNORMAL HIGH (ref 65–99)
Glucose-Capillary: 240 mg/dL — ABNORMAL HIGH (ref 65–99)
Glucose-Capillary: 326 mg/dL — ABNORMAL HIGH (ref 65–99)

## 2014-08-11 LAB — MAGNESIUM: MAGNESIUM: 1.5 mg/dL — AB (ref 1.7–2.4)

## 2014-08-11 LAB — CREATININE, SERUM
Creatinine, Ser: 1.39 mg/dL — ABNORMAL HIGH (ref 0.44–1.00)
GFR calc Af Amer: 43 mL/min — ABNORMAL LOW (ref >60)
GFR calc non Af Amer: 37 mL/min — ABNORMAL LOW (ref >60)

## 2014-08-11 MED ORDER — SODIUM CHLORIDE 0.9 % IV SOLN
0.5000 mg/kg/h | INTRAVENOUS | Status: DC
  Administered 2014-08-11 – 2014-08-12 (×2): 0.5 mg/kg/h via INTRAVENOUS
  Filled 2014-08-11 (×4): qty 100

## 2014-08-11 MED ORDER — HYDROCODONE-ACETAMINOPHEN 5-325 MG PO TABS
1.0000 | ORAL_TABLET | Freq: Four times a day (QID) | ORAL | Status: DC | PRN
  Administered 2014-08-11 – 2014-08-12 (×2): 1 via ORAL
  Filled 2014-08-11 (×2): qty 1

## 2014-08-11 MED ORDER — POTASSIUM CHLORIDE CRYS ER 20 MEQ PO TBCR
40.0000 meq | EXTENDED_RELEASE_TABLET | Freq: Two times a day (BID) | ORAL | Status: AC
  Administered 2014-08-11 (×2): 40 meq via ORAL
  Filled 2014-08-11 (×2): qty 2

## 2014-08-11 MED ORDER — INSULIN ASPART 100 UNIT/ML ~~LOC~~ SOLN
4.0000 [IU] | Freq: Once | SUBCUTANEOUS | Status: AC
  Administered 2014-08-11: 4 [IU] via SUBCUTANEOUS

## 2014-08-11 MED ORDER — INSULIN ASPART 100 UNIT/ML ~~LOC~~ SOLN
0.0000 [IU] | Freq: Three times a day (TID) | SUBCUTANEOUS | Status: DC
  Administered 2014-08-11: 11 [IU] via SUBCUTANEOUS
  Administered 2014-08-12: 3 [IU] via SUBCUTANEOUS
  Administered 2014-08-12: 2 [IU] via SUBCUTANEOUS
  Administered 2014-08-12: 3 [IU] via SUBCUTANEOUS

## 2014-08-11 MED ORDER — SODIUM CHLORIDE 0.9 % IV SOLN: 1.0000 g | Freq: Once | INTRAVENOUS | Status: DC

## 2014-08-11 MED ORDER — INSULIN ASPART 100 UNIT/ML ~~LOC~~ SOLN: 0.0000 [IU] | Freq: Three times a day (TID) | SUBCUTANEOUS | Status: DC

## 2014-08-11 MED ORDER — SODIUM CHLORIDE 0.9 % IJ SOLN
10.0000 mL | INTRAMUSCULAR | Status: DC | PRN
  Administered 2014-08-12 (×2): 10 mL
  Administered 2014-08-12: 20 mL
  Filled 2014-08-11 (×3): qty 40

## 2014-08-11 MED ORDER — MAGNESIUM SULFATE 2 GM/50ML IV SOLN
2.0000 g | Freq: Once | INTRAVENOUS | Status: AC
  Administered 2014-08-11: 2 g via INTRAVENOUS
  Filled 2014-08-11: qty 50

## 2014-08-11 NOTE — H&P (Signed)
[facility] History and Physical  Brittney Cunningham XYI:[id number] DOB: 07-11-1940 DOA: 08/10/2014  Referring physician: [name], MD PCP: Brittney Sou, MD   Chief Complaint: Abnormal lab results  HPI: Brittney Cunningham is a 74 y.o. female with the low past medical history which includes metastatic breast cancer to the bone. She was recently seen in the ED due to due to hypocalcemia and returned to the ED today [facility] after a follow-up calcium level ordered by her PCP was abnormal. She denies tetany or muscle spasms symptoms. She takes a significant amount of calcium carbonate supplements daily. She denies diarrhea or emesis. She is not on calcium sequestering medications.   She is currently in NAD and denies any other acute complaints.  Review of Systems:  Constitutional:  No weight loss, night sweats, Fevers, chills, fatigue.  HEENT:  No headaches, Difficulty swallowing,Tooth/dental problems,Sore throat,  No sneezing, itching, ear ache, nasal congestion, post nasal drip,  Cardio-vascular:  No chest pain, Orthopnea, PND, swelling in lower extremities, anasarca, dizziness, palpitations  GI:  No heartburn, indigestion, abdominal pain, nausea, vomiting, diarrhea, change in bowel habits, loss of appetite  Resp:  mild shortness of breath. No excess mucus, mild productive cough,  No coughing up of blood..No wheezing.No chest wall deformity  Skin:  no rash or lesions.  GU:  no dysuria, change in color of urine, no urgency or frequency. No flank pain.  Musculoskeletal: No muscle spams No joint pain or swelling. No decreased range of motion. No back pain.  Psych:  No change in mood or affect. No memory loss.   Past Medical History  Diagnosis Date  . 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 Depression   . GERD (gastroesophageal reflux disease)   . Hyperlipidemia   . Hypothyroidism   . Osteoarthritis   . Carotid artery stenosis     bilateral  . CAD (coronary artery disease)     medical therapy, old tot RCA, patent LAD, mod. severe ostial LCX stenosis--PCI would be too complex-med mgmt recommended.  . OSA (obstructive sleep apnea)   . Hypertension   . Anxiety   . Carotid artery occlusion   . Unspecified constipation 03/15/2012  . Bursitis   . Collagen vascular disease   . History of radiation therapy 10/31/12-11/13/12    rt&lt humerous 30Gy/72fx  . Valvular heart disease 07/11/2013  . Diarrhea 09/28/2013  . Chronic renal insufficiency, stage II (mild)     Borderline stage II/III, CrCl 50s/60s.  . History of blood transfusion   . Urinary tract bacterial infections   . 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 Diabetes mellitus type II     insulin   Past Surgical History  Procedure Laterality Date  . Incisional breast biopsy      remoted left breast biopsy  . Cesarean section    . Other surgical history      GYN surgery  . Knee surgery      right knee surgery  . Carotid endarterectomy  2011    right carotid  . Port-a-cath removal    . Modified mastectomy      Right breast  . Breast surgery    . Mastectomy    . Carotid stent      left  . Cardiac catheterization  06/2013    old total occ. of RCA, moderately severe ostial LCX  stenosis- medical therapy- would be complex PCI  . Carotid angiogram N/A 03/01/2011    Procedure: CAROTID ANGIOGRAM;  Surgeon: Brittney [place], MD;  Location: [facility] CATH LAB;  Service: Cardiovascular;  Laterality: N/A;  . Arch aortogram  03/01/2011    Procedure: ARCH AORTOGRAM;  Surgeon: Brittney Schaumburg, MD;  Location: [facility] CATH LAB;  Service: Cardiovascular;;  . Carotid stent insertion Left 03/15/2011    Procedure: CAROTID STENT INSERTION;  Surgeon: Brittney Mitchell, MD;  Location: [facility] CATH LAB;  Service: Cardiovascular;  Laterality: Left;  . Left heart catheterization with coronary angiogram N/A 07/07/2013    Procedure: LEFT HEART  CATHETERIZATION WITH CORONARY ANGIOGRAM;  Surgeon: Brittney Ohara, MD;  Location: [facility] CATH LAB;  Service: Cardiovascular;  Laterality: N/A;  . Carotid dopplers  05/2014    Patent ICAs, bilat ECA dz: no change compared to 2014   Social History:  reports that she quit smoking about 26 years ago. Her smoking use included Cigarettes. She has a 20 pack-year smoking history. She has never used smokeless tobacco. She reports that she does not drink alcohol or use illicit drugs.  Allergies  Allergen Reactions  . Chlorhexidine Hives, Itching and Rash    This was most likely a CONTACT DERMATITIS versus true systemic allergic reaction  . Adhesive [Tape] Hives  . Codeine Swelling  . Latex     Family History  Problem Relation Age of Onset  . Arthritis    . Coronary artery disease      first degree relative  . Stroke      first degree relative  . Diabetes Mother   . Heart disease Mother   . Hypertension Mother   . Cancer Mother   . Deep vein thrombosis Mother   . Colon cancer Neg Hx   . Stomach cancer Neg Hx   . Cancer Father     Pancreatic cancer  . Breast cancer Paternal Aunt 46     Prior to Admission medications   Medication Sig Start Date End Date Taking? Authorizing Provider  ACCU-CHEK AVIVA PLUS test strip CHECK BLOOD SUGAR TWICE A DAY FOR DX:250.00 08/10/14  Yes Brittney Sou, MD  amLODipine (NORVASC) 10 MG tablet TAKE ONE TABLET BY MOUTH EVERY DAY 06/15/14  Yes Brittney Sou, MD  aspirin 81 MG chewable tablet Chew 81 mg by mouth daily.   Yes Historical Provider, MD  atorvastatin (LIPITOR) 80 MG tablet Take 1 tablet (80 mg total) by mouth daily. 07/20/14  Yes Brittney M Martinique, MD  Blood Glucose Monitoring Suppl (ACCU-CHEK NANO SMARTVIEW) W/DEVICE KIT Use to check blood sugar twice a day. DX 250.00 03/28/13  Yes Brittney Lukes, MD  calcium carbonate (TUMS EX) 750 MG chewable tablet Chew 1 tablet by mouth 3 (three) times daily.   Yes Historical Provider, MD  carvedilol (COREG) 25 MG  tablet Take 1 tablet (25 mg total) by mouth 2 (two) times daily with a meal. 02/19/14  Yes Brittney M Martinique, MD  clopidogrel (PLAVIX) 75 MG tablet Take 1 tablet (75 mg total) by mouth daily. 11/28/13  Yes Brittney Mitchell, MD  cyanocobalamin (,VITAMIN B-12,) 1000 MCG/ML injection Inject 1,000 mcg into the muscle every 30 (thirty) days.    Yes Historical Provider, MD  darbepoetin (ARANESP) 200 MCG/0.4ML SOLN Inject 200 mcg into the skin every 14 (fourteen) days.    Yes Historical Provider, MD  everolimus (AFINITOR) 5 MG tablet Take 1 tablet (5 mg total) by mouth daily. 06/30/14  Yes  Chauncey Cruel, MD  exemestane (AROMASIN) 25 MG tablet Take 1 tablet (25 mg total) by mouth daily after breakfast. 06/12/14  Yes Laurie Panda, NP  gabapentin (NEURONTIN) 100 MG capsule Take 1 capsule (100 mg total) by mouth 3 (three) times daily. 04/06/14  Yes Laurie Panda, NP  hydrochlorothiazide (MICROZIDE) 12.5 MG capsule Take 2 capsules (25 mg total) by mouth daily. 04/08/14  Yes Isaiah Serge, NP  isosorbide mononitrate (IMDUR) 60 MG 24 hr tablet Take 2 tablets (120 mg total) by mouth daily. Patient taking differently: Take 120 mg by mouth 2 (two) times daily.  08/05/13  Yes Isaiah Serge, NP  levothyroxine (SYNTHROID, LEVOTHROID) 137 MCG tablet Take 1 tablet (137 mcg total) by mouth daily before breakfast. 06/10/14  Yes Brittney Sou, MD  morphine (MS CONTIN) 30 MG 12 hr tablet Take 1 tablet (30 mg total) by mouth 2 (two) times daily. 07/08/14  Yes Chauncey Cruel, MD  omeprazole (PRILOSEC) 40 MG capsule Take 1 capsule (40 mg total) by mouth daily. 03/02/14  Yes Brittney Lukes, MD  prochlorperazine (COMPAZINE) 10 MG tablet Take 1 tablet (10 mg total) by mouth every 6 (six) hours as needed for nausea or vomiting. 08/07/14  Yes Susanne Borders, NP  senna-docusate (SENOKOT-S) 8.6-50 MG per tablet Take 1 tablet by mouth at bedtime.    Yes Historical Provider, MD  calcium carbonate (TUMS) 500 MG chewable tablet Chew 1  tablet (200 mg of elemental calcium total) by mouth 3 (three) times daily with meals. Patient not taking: Reported on 08/10/2014 08/02/14   Brittney Speak, MD  glycerin adult (GLYCERIN ADULT) 2 G SUPP Place 1 suppository rectally once as needed (constipation). 04/13/12   Julianne Rice, MD  Insulin Isophane & Regular Human (HUMULIN 70/30 MIX) (70-30) 100 UNIT/ML PEN 20 U sub Q qAM and 10 U subQ qPM 07/13/14   Brittney Sou, MD  nitroGLYCERIN (NITROSTAT) 0.4 MG SL tablet Place 1 tablet (0.4 mg total) under the tongue every 5 (five) minutes as needed for chest pain. 07/08/13   Janece Canterbury, MD  polyethylene glycol [facility] / GLYCOLAX) packet Take 17 g by mouth daily as needed for mild constipation.     Historical Provider, MD   Physical Exam: Filed Vitals:   08/10/14 1821 08/10/14 1858 08/10/14 1954 08/10/14 2052  BP: 150/49 149/49 154/58 158/46  Pulse: 80 82 76 57  Temp:   97.7 F (36.5 C) 98.4 F (36.9 C)  TempSrc:   Oral Oral  Resp: $Remo'16 18 17 16  'EbJWf$ Height:    '5\' 3"'$  (1.6 m)  Weight:    86.183 kg (190 lb)  SpO2: 94% 94% 95% 97%    Wt Readings from Last 3 Encounters:  08/10/14 86.183 kg (190 lb)  08/10/14 85.186 kg (187 lb 12.8 oz)  08/07/14 87.272 kg (192 lb 6.4 oz)    General:  Appears calm and comfortable Eyes: PERRL, normal lids, irises & conjunctiva ENT: grossly normal hearing, lips & tongue Neck: no LAD, masses or thyromegaly Cardiovascular: RRR, no m/r/g. No LE edema. Telemetry: SR, no arrhythmias  Respiratory: CTA bilaterally, no w/r/r. Normal respiratory effort. Abdomen: soft, ntnd Skin: no rash or induration seen on limited exam Musculoskeletal: grossly normal tone BUE/BLE Psychiatric: grossly normal mood and affect, speech fluent and appropriate Neurologic: grossly non-focal.          Labs on Admission:  Basic Metabolic Panel:  Recent Labs Lab 08/07/14 1003 08/10/14 1145 08/10/14 1715 08/10/14 2324  NA 136 137 134  --   K 3.5 3.3 2.9  --   CL  --  95 96  --     CO2 $Re'22 21 23  'FyB$ --   GLUCOSE 255 233 257  --   BUN 17.$Remov'3 20 24  'BxqNbI$ --   CREATININE 1.1 1.34 1.53 1.39  CALCIUM 7.1 6.1 6.1  --   MG  --   --   --  1.5   Liver Function Tests:  Recent Labs Lab 08/07/14 1003 08/10/14 1715  AST 27 28  ALT 19 15  ALKPHOS 117 85  BILITOT 0.86 0.8  PROT 7.3 7.3  ALBUMIN 2.9 3.0   No results for input(s): LIPASE, AMYLASE in the last 168 hours. No results for input(s): AMMONIA in the last 168 hours. CBC:  Recent Labs Lab 08/07/14 1003 08/10/14 1715 08/10/14 2324  WBC 5.7 6.6 5.7  NEUTROABS 4.0 4.1  --   HGB 10.4 9.2 9.8  HCT 31.3 28.3 30.0  MCV 88.2 88.7 87.5  PLT 188 216 216   Cardiac Enzymes:  Recent Labs Lab 08/10/14 1715  TROPONINI <0.03    BNP (last 3 results)  Recent Labs  08/07/14 1158 08/10/14 1715  BNP 343.0 354.4    ProBNP (last 3 results) No results for input(s): PROBNP in the last 8760 hours.  CBG:  Recent Labs Lab 08/10/14 2135  GLUCAP 185    Radiological Exams on Admission: Dg Chest 2 View  08/10/2014   CLINICAL DATA:  Hypercalcemia with weakness, shortness breath and difficulty breathing. Initial encounter.  EXAM: CHEST  2 VIEW  COMPARISON:  Radiographs 04/13/2012.  CT 07/15/2014.  FINDINGS: The heart size and mediastinal contours are stable without definite adenopathy. There is diffuse atherosclerosis of the aorta and coronary arteries. There are new patchy bibasilar airspace opacities with small bilateral pleural effusions. Underlying extensive osseous metastatic disease is grossly stable without evidence of pathologic fracture. Right subclavian Port-A-Cath tip appears unchanged near the SVC right atrial junction.  IMPRESSION: New patchy bibasilar airspace opacities and small bilateral pleural effusions, suspicious for edema or atypical inflammation. The change from the CT of less than 1 month ago makes metastatic disease to the lungs unlikely. Diffuse osseous metastases are grossly stable.   Electronically  Signed   By: Richardean Sale M.D.   On: 08/10/2014 18:14    EKG: Independently reviewed.  Assessment/Plan Active Problems:   Hypocalcemia   Hypothyroidism   Diabetes mellitus type 2 in obese   Essential hypertension   Breast cancer metastasized to bone  Plan:  Check PTH, check vitamin D metabolites, check calcitonin, check phosphorus. The patient is a symptomatic, but we will replace calcium and magnesium with infusions.  Monitor calcium and other labs levels. Continue regular medications for hypothyroidism, diabetes hypertension and metastatic breast cancer. CBG monitoring. Consider endocrinology and oncology consults.       Code Status: Full DVT Prophylaxis: Lovenox Family Communication: Facesheet listed information. Katheen, Aslin [phone]  [phone]  Disposition Plan: Home with home health  Time spent: [facility] Pager [phone]

## 2014-08-11 NOTE — Care Management Note (Signed)
Case Management Note  Patient Details  Name: Brittney Cunningham MRN: [id number] Date of Birth: 1940/06/26  Subjective/Objective: 74 y/o f admitted w/hypocalcemia. Hx: Breast Ca. From home.                   Action/Plan:PT cons-await recommendations.   Expected Discharge Date:                  Expected Discharge Plan:  Home/Self Care  In-House Referral:     Discharge planning Services  CM Consult  Post Acute Care Choice:    Choice offered to:     DME Arranged:    DME Agency:     HH Arranged:    HH Agency:     Status of Service:  In process, will continue to follow  Medicare Important Message Given:    Date Medicare IM Given:    Medicare IM give by:    Date Additional Medicare IM Given:    Additional Medicare Important Message give by:     If discussed at [place] of Stay Meetings, dates discussed:    Additional Comments:  Dessa Phi, RN 08/11/2014, 1:24 PM

## 2014-08-11 NOTE — Progress Notes (Signed)
Inpatient Diabetes Program Recommendations  AACE/ADA: New Consensus Statement on Inpatient Glycemic Control (2013)  Target Ranges:  Prepandial:   less than 140 mg/dL      Peak postprandial:   less than 180 mg/dL (1-2 hours)      Critically ill patients:  140 - 180 mg/dL   Reason for Visit: Hyperglycemia Diabetes history: DM2 Outpatient Diabetes medications: Humulin 70/30 20 units in am and 10 units QHS Current orders for Inpatient glycemic control: None  Results for Brittney Cunningham, Brittney Cunningham (MRN [id number]) as of 08/11/2014 16:55  Ref. Range 08/10/2014 21:35 08/11/2014 08:09 08/11/2014 12:07  Glucose-Capillary Latest Ref Range: 65-99 mg/dL 185 (H) 205 (H) 240 (H)  Results for Brittney Cunningham, Brittney Cunningham (MRN [id number]) as of 08/11/2014 16:55  Ref. Range 08/10/2014 11:45  Sodium Latest Ref Range: 135-145 mEq/L 137  Potassium Latest Ref Range: 3.5-5.3 mEq/L 3.3 (L)  Chloride Latest Ref Range: 96-112 mEq/L 95 (L)  CO2 Latest Ref Range: 19-32 mEq/L 21  BUN Latest Ref Range: 6-23 mg/dL 20  Creatinine Latest Ref Range: 0.50-1.10 mg/dL 1.34 (H)  Calcium Latest Ref Range: 8.4-10.5 mg/dL 6.1 (LL)  Glucose Latest Ref Range: 70-99 mg/dL 233 (H)   Needs home insulin.  Inpatient Diabetes Program Recommendations Insulin - Basal: 70/30 20 units QAM and 10 units ac dinner Correction (SSI): Novolog sensitive tidwc and hs HgbA1C: 7.9% - doubt this is accurate with low H/H Diet: CHO mod med  Note: Will talk with pt regarding diabetes management in am. Thank you. Lorenda Peck, RD, LDN, CDE Inpatient Diabetes Coordinator [phone]

## 2014-08-11 NOTE — Progress Notes (Signed)
CBG is 194, no current order for HS insulin coverage. On-call NP Baltazar Najjar was notified. Awaiting further instruction. Pt is asymptomatic

## 2014-08-11 NOTE — Progress Notes (Signed)
PT seen and examined at bedside. Admitted after midnight, please see earlier admission note by Dr. Olevia Bowens.  In brief, patient is 75 year old female with known history of metastatic breast cancer to the bone, presented to emergency department with main concern of progressive weakness. She was seen by PCP earlier who has requested blood work and he was noted patient had severely low calcium and potassium and she was referred for an admission.  In emergency department, blood work notable for K2.9, calcium 6.1, creatinine 1.53, MG 1.4. Plan on continuing to supplement electrolytes as potassium is still low at 2.8. We'll continue IV fluids as patient is responding fairly well, creatinine continues trending down from 1.53 --> 1.31. Will supplement magnesium with 2 g IV infusion. Repeat electrolyte panel in the morning including magnesium level. Patient will need physical therapy evaluation once more medically stable.   Faye Ramsay, MD  [facility] Pager [phone]  If 7PM-7AM, please contact night-coverage [email or web address] Password [email or web address]

## 2014-08-11 NOTE — Progress Notes (Signed)
CBG 326. MD notified.

## 2014-08-12 ENCOUNTER — Encounter: Payer: Self-pay | Admitting: Family Medicine

## 2014-08-12 DIAGNOSIS — C7951 Secondary malignant neoplasm of bone: Secondary | ICD-10-CM

## 2014-08-12 DIAGNOSIS — E669 Obesity, unspecified: Secondary | ICD-10-CM

## 2014-08-12 DIAGNOSIS — E119 Type 2 diabetes mellitus without complications: Secondary | ICD-10-CM

## 2014-08-12 DIAGNOSIS — C50919 Malignant neoplasm of unspecified site of unspecified female breast: Secondary | ICD-10-CM

## 2014-08-12 LAB — CBC WITH DIFFERENTIAL/PLATELET
BASOS ABS: 0 10*3/uL (ref 0.0–0.1)
Basophils Relative: 0 % (ref 0–1)
Eosinophils Absolute: 0.1 10*3/uL (ref 0.0–0.7)
Eosinophils Relative: 2 % (ref 0–5)
HCT: 23.7 % — ABNORMAL LOW (ref 36.0–46.0)
Hemoglobin: 7.7 g/dL — ABNORMAL LOW (ref 12.0–15.0)
Lymphocytes Relative: 26 % (ref 12–46)
Lymphs Abs: 1.3 10*3/uL (ref 0.7–4.0)
MCH: 28.9 pg (ref 26.0–34.0)
MCHC: 32.5 g/dL (ref 30.0–36.0)
MCV: 89.1 fL (ref 78.0–100.0)
Monocytes Absolute: 0.6 10*3/uL (ref 0.1–1.0)
Monocytes Relative: 11 % (ref 3–12)
Neutro Abs: 3.2 10*3/uL (ref 1.7–7.7)
Neutrophils Relative %: 61 % (ref 43–77)
Platelets: 181 10*3/uL (ref 150–400)
RBC: 2.66 MIL/uL — AB (ref 3.87–5.11)
RDW: 15.1 % (ref 11.5–15.5)
WBC: 5.2 10*3/uL (ref 4.0–10.5)

## 2014-08-12 LAB — COMPREHENSIVE METABOLIC PANEL
ALK PHOS: 84 U/L (ref 38–126)
ALT: 12 U/L — AB (ref 14–54)
AST: 20 U/L (ref 15–41)
Albumin: 2.5 g/dL — ABNORMAL LOW (ref 3.5–5.0)
Anion gap: 9 (ref 5–15)
BUN: 20 mg/dL (ref 6–20)
CALCIUM: 8.4 mg/dL — AB (ref 8.9–10.3)
CO2: 24 mmol/L (ref 22–32)
CREATININE: 1.28 mg/dL — AB (ref 0.44–1.00)
Chloride: 103 mmol/L (ref 101–111)
GFR calc Af Amer: 48 mL/min — ABNORMAL LOW (ref >60)
GFR, EST NON AFRICAN AMERICAN: 41 mL/min — AB (ref >60)
Glucose, Bld: 192 mg/dL — ABNORMAL HIGH (ref 65–99)
POTASSIUM: 4.9 mmol/L (ref 3.5–5.1)
SODIUM: 136 mmol/L (ref 135–145)
Total Bilirubin: 0.7 mg/dL (ref 0.3–1.2)
Total Protein: 6.2 g/dL — ABNORMAL LOW (ref 6.5–8.1)

## 2014-08-12 LAB — GLUCOSE, CAPILLARY
GLUCOSE-CAPILLARY: 174 mg/dL — AB (ref 65–99)
GLUCOSE-CAPILLARY: 136 mg/dL — AB (ref 65–99)
GLUCOSE-CAPILLARY: 171 mg/dL — AB (ref 65–99)

## 2014-08-12 LAB — PREPARE RBC (CROSSMATCH)

## 2014-08-12 LAB — MAGNESIUM: Magnesium: 1.7 mg/dL (ref 1.7–2.4)

## 2014-08-12 MED ORDER — SODIUM CHLORIDE 0.9 % IV SOLN: Freq: Once | INTRAVENOUS | Status: DC

## 2014-08-12 MED ORDER — K PHOS MONO-SOD PHOS DI & MONO 155-852-130 MG PO TABS
250.0000 mg | ORAL_TABLET | Freq: Three times a day (TID) | ORAL | Status: DC
  Administered 2014-08-12 (×2): 250 mg via ORAL
  Filled 2014-08-12 (×3): qty 1

## 2014-08-12 MED ORDER — LEVOFLOXACIN 250 MG PO TABS: 250.0000 mg | ORAL_TABLET | Freq: Every day | ORAL | Status: DC

## 2014-08-12 MED ORDER — INSULIN ISOPHANE & REGULAR (HUMAN 70-30)100 UNIT/ML KWIKPEN: 10.0000 [IU] | PEN_INJECTOR | Freq: Two times a day (BID) | SUBCUTANEOUS | Status: DC

## 2014-08-12 MED ORDER — INSULIN ASPART PROT & ASPART (70-30 MIX) 100 UNIT/ML ~~LOC~~ SUSP
20.0000 [IU] | Freq: Every day | SUBCUTANEOUS | Status: DC
  Administered 2014-08-12: 20 [IU] via SUBCUTANEOUS
  Filled 2014-08-12: qty 10

## 2014-08-12 MED ORDER — LEVOFLOXACIN 500 MG PO TABS: 500.0000 mg | ORAL_TABLET | Freq: Every day | ORAL | Status: DC

## 2014-08-12 MED ORDER — INSULIN ASPART PROT & ASPART (70-30 MIX) 100 UNIT/ML ~~LOC~~ SUSP
10.0000 [IU] | Freq: Every day | SUBCUTANEOUS | Status: DC
  Administered 2014-08-12: 10 [IU] via SUBCUTANEOUS
  Filled 2014-08-12: qty 10

## 2014-08-12 MED ORDER — LEVOFLOXACIN 500 MG PO TABS
500.0000 mg | ORAL_TABLET | Freq: Once | ORAL | Status: AC
  Administered 2014-08-12: 500 mg via ORAL
  Filled 2014-08-12 (×2): qty 1

## 2014-08-12 MED ORDER — HEPARIN SOD (PORK) LOCK FLUSH 100 UNIT/ML IV SOLN: 500.0000 [IU] | INTRAVENOUS | Status: DC | PRN

## 2014-08-12 NOTE — Care Management Important Message (Signed)
Important Message  Patient Details  Name: Brittney Cunningham MRN: [id number] Date of Birth: 1940-02-11   Medicare Important Message Given:  [facility] notification given    Camillo Flaming 08/12/2014, 10:51 AMImportant Message  Patient Details  Name: Brittney Cunningham MRN: [id number] Date of Birth: [birth year]   Medicare Important Message Given:  Yes-second notification given    Camillo Flaming 08/12/2014, 10:50 AM

## 2014-08-12 NOTE — Progress Notes (Signed)
Blood transfusion completed. Notified MD and clarified no orders for post transfusion H&H.  MD stated pt ready for discharge.

## 2014-08-12 NOTE — Evaluation (Signed)
Physical Therapy Evaluation Patient Details Name: Brittney Cunningham MRN: [id number] DOB: [birth year] Today's Date: 08/12/2014   History of Present Illness  74 yo female admitted with hypocalcemia. Hx of metastatic breast cancer to bone, DM, HTN, OA  Clinical Impression  On eval, pt required Min assist for mobility-able to ambulate ~75 feet with use of straight cane. Pt is unsteady and generally weak. O2 sats dropped to 85% on RA during ambulation. Pt states she has access to RW at home (I recommended to her that she use that until strength/activity tolerance improve). Recommend HHPT.     Follow Up Recommendations Home health PT;Supervision - Intermittent    Equipment Recommendations  None recommended by PT    Recommendations for Other Services       Precautions / Restrictions Precautions Precautions: Fall Precaution Comments: monitor sats Restrictions Weight Bearing Restrictions: No      Mobility  Bed Mobility               General bed mobility comments: pt sitting EOB  Transfers Overall transfer level: Needs assistance Equipment used: Rolling walker (2 wheeled) Transfers: Sit to/from Stand Sit to Stand: Min guard         General transfer comment: close guard for safety  Ambulation/Gait Ambulation/Gait assistance: Min assist Ambulation Distance (Feet): 75 Feet (75'x1, 25'x1) Assistive device: Straight cane Gait Pattern/deviations: Step-through pattern;Decreased stride length     General Gait Details: slow gait speed. Pt is unsteady and fatigues quickly/easily. O2 sats dropped to 85% o RA during ambulation. Seated rest break taken/needed to allow for recovery. Pt intermittently held onto hallway handrail along with use of cane for stability  Stairs            Wheelchair Mobility    Modified Rankin (Stroke Patients Only)       Balance Overall balance assessment: Needs assistance         Standing balance support: During functional  activity Standing balance-Leahy Scale: Fair                               Pertinent Vitals/Pain Pain Assessment: No/denies pain    Home Living Family/patient expects to be discharged to:: Private residence Living Arrangements: Spouse/significant other Available Help at Discharge: Family Type of Home: House Home Access: Ramped entrance     Home Layout: One level Home Equipment: Environmental consultant - 2 wheels;Cane - single point      Prior Function Level of Independence: Independent with assistive device(s)         Comments: uses cane for ambulation at baseline     Hand Dominance        Extremity/Trunk Assessment   Upper Extremity Assessment: Generalized weakness           Lower Extremity Assessment: Generalized weakness      Cervical / Trunk Assessment: Kyphotic  Communication   Communication: No difficulties  Cognition Arousal/Alertness: Awake/alert Behavior During Therapy: WFL for tasks assessed/performed Overall Cognitive Status: Within Functional Limits for tasks assessed                      General Comments      Exercises        Assessment/Plan    PT Assessment Patient needs continued PT services  PT Diagnosis Difficulty walking;Generalized weakness   PT Problem List Decreased strength;Decreased activity tolerance;Decreased balance;Decreased mobility;Obesity;Decreased knowledge of use of DME;Pain  PT Treatment Interventions DME instruction;Gait training;Functional  mobility training;Therapeutic activities;Therapeutic exercise;Patient/family education;Balance training   PT Goals (Current goals can be found in the Care Plan section) Acute Rehab PT Goals Patient Stated Goal: to feel better. regain strength PT Goal Formulation: With patient/family Time For Goal Achievement: 08/26/14 Potential to Achieve Goals: Good    Frequency Min 3X/week   Barriers to discharge        Co-evaluation               End of Session  Equipment Utilized During Treatment: Gait belt Activity Tolerance: Patient limited by fatigue Patient left: in bed;with call bell/phone within reach;with family/visitor present           Time: 1002-1015 PT Time Calculation (min) (ACUTE ONLY): 13 min   Charges:   PT Evaluation $Initial PT Evaluation Tier I: 1 Procedure     PT G Codes:        Weston Anna, MPT Pager: [phone]

## 2014-08-12 NOTE — Discharge Summary (Signed)
Physician Discharge Summary  Brittney Cunningham:[id number] DOB: May 20, 1940 DOA: 08/10/2014  PCP: Brittney Sou, MD  Admit date: 08/10/2014 Discharge date: 08/12/2014  Recommendations for Outpatient Follow-up:  1. Pt will need to follow up with PCP in 1 week post discharge 2. Please obtain BMP to evaluate electrolytes and kidney function 3. Please also check CBC to evaluate Hg and Hct levels  Discharge Diagnoses:  Active Problems:   Hypothyroidism   Diabetes mellitus type 2 in obese   Essential hypertension   Breast cancer metastasized to bone   Hypocalcemia  Discharge Condition: Stable  Diet recommendation: Heart healthy diet discussed in details   History of present illness:  74 year old female with known history of metastatic breast cancer to the bone, presented to emergency department with main concern of progressive weakness. She was seen by PCP who has requested blood work and he was noted patient had severely low calcium and potassium and she was referred for an admission.  In emergency department, blood work notable for K2.9, calcium 6.1, creatinine 1.53, MG 1.4.   Hospital Course:  Active Problems:   Severe electrolyte abnormalities - low Mg, K, Phosp - supplemented all three electrolytes - since pt insists on going home, recommendation was to have blood work repeated next week to make sure it is stable and WNL - pt looks much better this AM, ambulating with no problems - denies dizziness or weakness      Acute renal failure -  patient responded well to IVF, creatinine continues trending down from 1.53 --> 1.31 --> 1.28    Productive cough on exam  - with no respiratory distress  - appears to be secondary to bibasilar PNA  - continue empiric ABX upon discharge to complete therapy     Anemia of chronic disease, malignancy - will transfuse one unit of PRBC prior to discharge     Hypothyroidism - continue synthroid     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    Essential hypertension - please note that pt reported Imdur was discontinued     Breast cancer metastasized to bone - oncologist notified    Procedures/Studies: Dg Chest 2 View  08/10/2014   CLINICAL DATA:  Hypercalcemia with weakness, shortness breath and difficulty breathing. Initial encounter.  EXAM: CHEST  2 VIEW  COMPARISON:  Radiographs 04/13/2012.  CT 07/15/2014.  FINDINGS: The heart size and mediastinal contours are stable without definite adenopathy. There is diffuse atherosclerosis of the aorta and coronary arteries. There are new patchy bibasilar airspace opacities with small bilateral pleural effusions. Underlying extensive osseous metastatic disease is grossly stable without evidence of pathologic fracture. Right subclavian Port-A-Cath tip appears unchanged near the SVC right atrial junction.  IMPRESSION: New patchy bibasilar airspace opacities and small bilateral pleural effusions, suspicious for edema or atypical inflammation. The change from the CT of less than 1 month ago makes metastatic disease to the lungs unlikely. Diffuse osseous metastases are grossly stable.   Electronically Signed   By: Brittney Cunningham M.D.   On: 08/10/2014 18:14   Ct Chest W Contrast  07/15/2014   CLINICAL DATA:  Breast cancer with right mastectomy. Radiation therapy complete. Bone metastasis. Subsequent treatment evaluation.  EXAM: CT CHEST WITH CONTRAST  TECHNIQUE: Multidetector CT imaging of the chest was performed during intravenous contrast administration.  CONTRAST:  37mL OMNIPAQUE IOHEXOL 300 MG/ML  SOLN  COMPARISON:  Bone scan 06/05/2014, CT 07/05/2013  FINDINGS: Mediastinum/Nodes: There is a port in the right chest wall. No axillary supraclavicular lymphadenopathy. No mediastinal hilar lymphadenopathy. No pericardial fluid.  Lungs/Pleura: 5 mm nodule at the right lung base on image 39,  series 5 is new from CT 07/05/2013. There is mild pleural thickening within the right hemi thorax not changed from prior  Upper abdomen: Limited view of the liver, kidneys, pancreas are unremarkable. Normal adrenal glands.  Musculoskeletal: There is diffuse sclerotic metastasis within the spine ribs and shoulder girdles.  IMPRESSION: 1. New noncalcified pulmonary nodule at the right lung base. This lesion is too small for accurate characterization by FDG PET imaging. Recommend short interval CT follow-up (1-3 months). 2. Stable diffuse skeletal sclerotic metastasis   Electronically Signed   By: Brittney Cunningham M.D.   On: 07/15/2014 11:34   Dg Foot Complete Left  07/27/2014   CLINICAL DATA:  Dropped addition on the top of the foot 1 week ago. Pain and bruising of the metatarsal region.  EXAM: LEFT FOOT - COMPLETE 3+ VIEW  COMPARISON:  None.  FINDINGS: There is dorsal soft tissue swelling but no evidence of underlying fracture. No significant degenerative changes.  IMPRESSION: Dorsal soft tissue swelling.  No acute bony finding.   Electronically Signed   By: Brittney Cunningham M.D.   On: 07/27/2014 15:20    Discharge Exam: Filed Vitals:   08/12/14 0800  BP: 138/54  Pulse:   Temp:   Resp:    Filed Vitals:   08/11/14 1410 08/11/14 2120 08/12/14 0522 08/12/14 0800  BP: 158/57 143/49 126/41 138/54  Pulse: 77 81 79   Temp: 98.8 F (37.1 C) 98.3 F (36.8 C) 99.1 F (37.3 C)   TempSrc: Oral Oral Oral   Resp: $Remo'18 16 16   'Nrraa$ Height:      Weight:      SpO2: 92% 93% 93%     General: Pt is alert, follows commands appropriately, not in acute distress Cardiovascular: Regular rate and rhythm, no rubs, no gallops Respiratory: Clear to auscultation bilaterally, no wheezing, no crackles, no rhonchi Abdominal: Soft, non tender, non distended, bowel sounds +, no guarding Extremities: no edema, no cyanosis, pulses palpable bilaterally DP and PT Neuro: Grossly nonfocal  Discharge Instructions  Discharge  Instructions    Diet - low sodium heart healthy    Complete by:  As directed      Increase activity slowly    Complete by:  As directed             Medication List    STOP taking these medications        isosorbide mononitrate 60 MG 24 hr tablet  Commonly known as:  IMDUR     morphine 30 MG 12 hr tablet  Commonly known as:  MS CONTIN      TAKE these medications        ACCU-CHEK AVIVA PLUS test strip  Generic drug:  glucose blood  CHECK BLOOD SUGAR TWICE A DAY FOR DX:250.00     ACCU-CHEK NANO SMARTVIEW W/DEVICE Kit  Use to check blood sugar twice a day. DX 250.00     amLODipine 10 MG tablet  Commonly known as:  NORVASC  TAKE ONE TABLET BY MOUTH EVERY DAY     aspirin 81 MG chewable tablet  Chew 81 mg by mouth daily.     atorvastatin 80 MG tablet  Commonly known as:  LIPITOR  Take 1 tablet (80 mg total) by mouth  daily.     calcium carbonate 750 MG chewable tablet  Commonly known as:  TUMS EX  Chew 1 tablet by mouth 3 (three) times daily.     calcium carbonate 500 MG chewable tablet  Commonly known as:  TUMS  Chew 1 tablet (200 mg of elemental calcium total) by mouth 3 (three) times daily with meals.     carvedilol 25 MG tablet  Commonly known as:  COREG  Take 1 tablet (25 mg total) by mouth 2 (two) times daily with a meal.     clopidogrel 75 MG tablet  Commonly known as:  PLAVIX  Take 1 tablet (75 mg total) by mouth daily.     cyanocobalamin 1000 MCG/ML injection  Commonly known as:  (VITAMIN B-12)  Inject 1,000 mcg into the muscle every 30 (thirty) days.     darbepoetin 200 MCG/0.4ML Soln injection  Commonly known as:  ARANESP  Inject 200 mcg into the skin every 14 (fourteen) days.     everolimus 5 MG tablet  Commonly known as:  AFINITOR  Take 1 tablet (5 mg total) by mouth daily.     exemestane 25 MG tablet  Commonly known as:  AROMASIN  Take 1 tablet (25 mg total) by mouth daily after breakfast.     gabapentin 100 MG capsule  Commonly known as:   NEURONTIN  Take 1 capsule (100 mg total) by mouth 3 (three) times daily.     glycerin adult 2 G Supp  Commonly known as:  glycerin adult  Place 1 suppository rectally once as needed (constipation).     hydrochlorothiazide 12.5 MG capsule  Commonly known as:  MICROZIDE  Take 2 capsules (25 mg total) by mouth daily.     Insulin Isophane & Regular Human (70-30) 100 UNIT/ML PEN  Commonly known as:  HUMULIN 70/30 MIX  20 U sub Q qAM and 10 U subQ qPM     levofloxacin 250 MG tablet  Commonly known as:  LEVAQUIN  Take 1 tablet (250 mg total) by mouth daily.     levothyroxine 137 MCG tablet  Commonly known as:  SYNTHROID, LEVOTHROID  Take 1 tablet (137 mcg total) by mouth daily before breakfast.     nitroGLYCERIN 0.4 MG SL tablet  Commonly known as:  NITROSTAT  Place 1 tablet (0.4 mg total) under the tongue every 5 (five) minutes as needed for chest pain.     omeprazole 40 MG capsule  Commonly known as:  PRILOSEC  Take 1 capsule (40 mg total) by mouth daily.     polyethylene glycol packet  Commonly known as:  MIRALAX / GLYCOLAX  Take 17 g by mouth daily as needed for mild constipation.     prochlorperazine 10 MG tablet  Commonly known as:  COMPAZINE  Take 1 tablet (10 mg total) by mouth every 6 (six) hours as needed for nausea or vomiting.     senna-docusate 8.6-50 MG per tablet  Commonly known as:  Senokot-S  Take 1 tablet by mouth at bedtime.            Follow-up Information    Follow up with [facility].   Specialty:  Emergency Medicine   Why:  As needed, If symptoms worsen   Contact information:   [address] [id number] [place] [phone]      Follow up with Brittney Sou, MD.   Specialty:  Family Medicine   Contact information:   [address] [place] [address] [phone]       Follow up with Faye Ramsay, MD.   Specialty:  Internal Medicine   Why:  As needed  call my cell phone [phone]   Contact information:   [address] [place] [address] [phone]        The results of significant diagnostics from this hospitalization (including imaging, microbiology, ancillary and laboratory) are listed below for reference.     Microbiology: No results found for this or any previous visit (from the past 240 hour(s)).   Labs: Basic Metabolic Panel:  Recent Labs Lab 08/07/14 1003 08/10/14 1145 08/10/14 1715 08/10/14 2324 08/11/14 0415 08/11/14 0630 08/12/14 0550  NA 136 137 134*  --  134*  --  136  K 3.5 3.3* 2.9*  --  2.8*  --  4.9  CL  --  95* 96*  --  97*  --  103  CO2 $Re'22 21 23  'Jgu$ --  25  --  24  GLUCOSE 255* 233* 257*  --  233*  --  192*  BUN 17.3 20 24*  --  21*  --  20  CREATININE 1.1 1.34* 1.53* 1.39* 1.31*  --  1.28*  CALCIUM 7.1* 6.1* 6.1*  --  6.1*  --  8.4*  MG  --   --   --  1.5*  --   --  1.7  PHOS  --   --   --   --   --  1.4*  --    Liver Function Tests:  Recent Labs Lab 08/07/14 1003 08/10/14 1715 08/11/14 0415 08/12/14 0550  AST $Re'27 28 24 20  'bdG$ ALT 19 15 13* 12*  ALKPHOS 117 85 82 84  BILITOT 0.86 0.8 1.0 0.7  PROT 7.3 7.3 6.6 6.2*  ALBUMIN 2.9* 3.0* 2.8* 2.5*   No results for input(s): LIPASE, AMYLASE in the last 168 hours. No results for input(s): AMMONIA in the last 168 hours. CBC:  Recent Labs Lab 08/07/14 1003 08/10/14 1715 08/10/14 2324 08/11/14 0415 08/12/14 0550  WBC 5.7 6.6 5.7 4.9 5.2  NEUTROABS 4.0 4.1  --  2.9 3.2  HGB 10.4* 9.2* 9.8* 8.4* 7.7*  HCT 31.3* 28.3* 30.0* 25.6* 23.7*  MCV 88.2 88.7 87.5 86.2 89.1  PLT 188 216 216 178 181   Cardiac Enzymes:  Recent Labs Lab 08/10/14 1715  TROPONINI <0.03   BNP: BNP (last 3 results)  Recent Labs  08/07/14 1158 08/10/14 1715  BNP 343.0* 354.4*    ProBNP (last 3 results) No results for input(s): PROBNP in the last 8760 hours.  CBG:  Recent Labs Lab 08/11/14 0809 08/11/14 1207 08/11/14 1649  08/11/14 2117 08/12/14 0759  GLUCAP 205* 240* 326* 194* 174*     SIGNED: Time coordinating discharge: Over 30 minutes  Faye Ramsay, MD  [facility] 08/12/2014, 9:45 AM Pager [phone]  If 7PM-7AM, please contact night-coverage [email or web address] Password [email or web address]

## 2014-08-13 ENCOUNTER — Telehealth: Payer: Self-pay | Admitting: Cardiology

## 2014-08-13 ENCOUNTER — Telehealth: Payer: Self-pay | Admitting: *Deleted

## 2014-08-13 ENCOUNTER — Encounter (HOSPITAL_COMMUNITY): Payer: Self-pay | Admitting: Family Medicine

## 2014-08-13 ENCOUNTER — Telehealth: Payer: Self-pay

## 2014-08-13 DIAGNOSIS — C50911 Malignant neoplasm of unspecified site of right female breast: Secondary | ICD-10-CM

## 2014-08-13 DIAGNOSIS — C7951 Secondary malignant neoplasm of bone: Principal | ICD-10-CM

## 2014-08-13 LAB — TYPE AND SCREEN
ABO/RH(D): O NEG
Antibody Screen: NEGATIVE
Unit division: 0

## 2014-08-13 LAB — CALCITONIN: Calcitonin: 8.1 pg/mL — ABNORMAL HIGH (ref 0.0–5.0)

## 2014-08-13 MED ORDER — MORPHINE SULFATE ER 30 MG PO TBCR
30.0000 mg | EXTENDED_RELEASE_TABLET | Freq: Two times a day (BID) | ORAL | Status: DC
Start: 2014-08-13 — End: 2014-09-02

## 2014-08-13 MED ORDER — ISOSORBIDE MONONITRATE ER 60 MG PO TB24: 60.0000 mg | ORAL_TABLET | Freq: Two times a day (BID) | ORAL | Status: DC

## 2014-08-13 NOTE — Telephone Encounter (Signed)
Rx(s) sent to pharmacy electronically.  

## 2014-08-13 NOTE — Telephone Encounter (Signed)
Left message on patients answering machine that prescription was ready for pickup.

## 2014-08-13 NOTE — Telephone Encounter (Signed)
Pt called requesting MS refill. She is going out of town this weekend. Rx prepared for signature. Pt also missed appt 7/12 d/t being in hospital. Informed Dr [name] of this.

## 2014-08-13 NOTE — Telephone Encounter (Signed)
°  1. Which medications need to be refilled? Isosorbide-Please call today,pt is going out of town 5 A.M. Tomorrow morning 2. Which pharmacy is medication to be sent to?[facility]-[phone]  3. Do they need a 30 day or 90 day supply? 30 and refills  4. Would they like a call back once the medication has been sent to the pharmacy? no

## 2014-08-16 LAB — VITAMIN D 1,25 DIHYDROXY
VITAMIN D 1, 25 (OH) TOTAL: 100 pg/mL
Vitamin D2 1, 25 (OH)2: <10 pg/mL
Vitamin D3 1, 25 (OH)2: 96 pg/mL

## 2014-08-18 NOTE — Telephone Encounter (Signed)
No entry 

## 2014-08-19 ENCOUNTER — Ambulatory Visit: Payer: Self-pay | Admitting: Family Medicine

## 2014-08-24 ENCOUNTER — Encounter: Payer: Self-pay | Admitting: Family Medicine

## 2014-08-24 ENCOUNTER — Other Ambulatory Visit: Payer: Self-pay

## 2014-08-24 ENCOUNTER — Ambulatory Visit: Payer: Self-pay

## 2014-08-24 ENCOUNTER — Encounter (HOSPITAL_COMMUNITY): Payer: Self-pay | Admitting: Emergency Medicine

## 2014-08-24 ENCOUNTER — Ambulatory Visit (INDEPENDENT_AMBULATORY_CARE_PROVIDER_SITE_OTHER): Payer: Medicare Other | Admitting: Family Medicine

## 2014-08-24 ENCOUNTER — Other Ambulatory Visit: Payer: Self-pay | Admitting: Oncology

## 2014-08-24 ENCOUNTER — Emergency Department (HOSPITAL_COMMUNITY): Payer: Medicare Other

## 2014-08-24 ENCOUNTER — Inpatient Hospital Stay (HOSPITAL_COMMUNITY)
Admission: EM | Admit: 2014-08-24 | Discharge: 2014-09-03 | DRG: 291 | Disposition: A | Discharge status: Skilled Nursing Facility | Payer: Medicare Other | Attending: Internal Medicine | Admitting: Internal Medicine

## 2014-08-24 VITALS — BP 146/69 | HR 90 | Temp 97.9°F | Resp 18 | Ht 63.0 in | Wt 199.0 lb

## 2014-08-24 DIAGNOSIS — G4700 Insomnia, unspecified: Secondary | ICD-10-CM | POA: Diagnosis present

## 2014-08-24 DIAGNOSIS — I129 Hypertensive chronic kidney disease with stage 1 through stage 4 chronic kidney disease, or unspecified chronic kidney disease: Secondary | ICD-10-CM | POA: Diagnosis present

## 2014-08-24 DIAGNOSIS — E876 Hypokalemia: Secondary | ICD-10-CM

## 2014-08-24 DIAGNOSIS — N189 Chronic kidney disease, unspecified: Secondary | ICD-10-CM | POA: Diagnosis not present

## 2014-08-24 DIAGNOSIS — E039 Hypothyroidism, unspecified: Secondary | ICD-10-CM | POA: Diagnosis present

## 2014-08-24 DIAGNOSIS — J9601 Acute respiratory failure with hypoxia: Secondary | ICD-10-CM

## 2014-08-24 DIAGNOSIS — Z9011 Acquired absence of right breast and nipple: Secondary | ICD-10-CM | POA: Diagnosis present

## 2014-08-24 DIAGNOSIS — E669 Obesity, unspecified: Secondary | ICD-10-CM | POA: Diagnosis not present

## 2014-08-24 DIAGNOSIS — C50919 Malignant neoplasm of unspecified site of unspecified female breast: Secondary | ICD-10-CM

## 2014-08-24 DIAGNOSIS — J702 Acute drug-induced interstitial lung disorders: Secondary | ICD-10-CM | POA: Diagnosis present

## 2014-08-24 DIAGNOSIS — G629 Polyneuropathy, unspecified: Secondary | ICD-10-CM | POA: Diagnosis present

## 2014-08-24 DIAGNOSIS — Z6837 Body mass index (BMI) 37.0-37.9, adult: Secondary | ICD-10-CM | POA: Diagnosis not present

## 2014-08-24 DIAGNOSIS — Z8249 Family history of ischemic heart disease and other diseases of the circulatory system: Secondary | ICD-10-CM

## 2014-08-24 DIAGNOSIS — K219 Gastro-esophageal reflux disease without esophagitis: Secondary | ICD-10-CM | POA: Diagnosis present

## 2014-08-24 DIAGNOSIS — C7951 Secondary malignant neoplasm of bone: Secondary | ICD-10-CM

## 2014-08-24 DIAGNOSIS — Z66 Do not resuscitate: Secondary | ICD-10-CM | POA: Diagnosis present

## 2014-08-24 DIAGNOSIS — R0902 Hypoxemia: Secondary | ICD-10-CM | POA: Diagnosis not present

## 2014-08-24 DIAGNOSIS — E1169 Type 2 diabetes mellitus with other specified complication: Secondary | ICD-10-CM

## 2014-08-24 DIAGNOSIS — J9691 Respiratory failure, unspecified with hypoxia: Secondary | ICD-10-CM | POA: Diagnosis not present

## 2014-08-24 DIAGNOSIS — I10 Essential (primary) hypertension: Secondary | ICD-10-CM | POA: Diagnosis present

## 2014-08-24 DIAGNOSIS — E440 Moderate protein-calorie malnutrition: Secondary | ICD-10-CM | POA: Diagnosis present

## 2014-08-24 DIAGNOSIS — Z923 Personal history of irradiation: Secondary | ICD-10-CM | POA: Diagnosis not present

## 2014-08-24 DIAGNOSIS — D649 Anemia, unspecified: Secondary | ICD-10-CM

## 2014-08-24 DIAGNOSIS — N183 Chronic kidney disease, stage 3 (moderate): Secondary | ICD-10-CM | POA: Diagnosis not present

## 2014-08-24 DIAGNOSIS — T451X5A Adverse effect of antineoplastic and immunosuppressive drugs, initial encounter: Secondary | ICD-10-CM | POA: Diagnosis present

## 2014-08-24 DIAGNOSIS — Z7902 Long term (current) use of antithrombotics/antiplatelets: Secondary | ICD-10-CM

## 2014-08-24 DIAGNOSIS — R0602 Shortness of breath: Secondary | ICD-10-CM | POA: Diagnosis present

## 2014-08-24 DIAGNOSIS — I2582 Chronic total occlusion of coronary artery: Secondary | ICD-10-CM | POA: Diagnosis present

## 2014-08-24 DIAGNOSIS — E1165 Type 2 diabetes mellitus with hyperglycemia: Secondary | ICD-10-CM | POA: Diagnosis present

## 2014-08-24 DIAGNOSIS — I5033 Acute on chronic diastolic (congestive) heart failure: Secondary | ICD-10-CM

## 2014-08-24 DIAGNOSIS — J129 Viral pneumonia, unspecified: Secondary | ICD-10-CM

## 2014-08-24 DIAGNOSIS — R110 Nausea: Secondary | ICD-10-CM | POA: Diagnosis present

## 2014-08-24 DIAGNOSIS — Z803 Family history of malignant neoplasm of breast: Secondary | ICD-10-CM

## 2014-08-24 DIAGNOSIS — I5031 Acute diastolic (congestive) heart failure: Secondary | ICD-10-CM | POA: Diagnosis not present

## 2014-08-24 DIAGNOSIS — D638 Anemia in other chronic diseases classified elsewhere: Secondary | ICD-10-CM

## 2014-08-24 DIAGNOSIS — Z87891 Personal history of nicotine dependence: Secondary | ICD-10-CM

## 2014-08-24 DIAGNOSIS — Z794 Long term (current) use of insulin: Secondary | ICD-10-CM

## 2014-08-24 DIAGNOSIS — R0600 Dyspnea, unspecified: Secondary | ICD-10-CM

## 2014-08-24 DIAGNOSIS — Z170 Estrogen receptor positive status [ER+]: Secondary | ICD-10-CM | POA: Diagnosis not present

## 2014-08-24 DIAGNOSIS — Z7982 Long term (current) use of aspirin: Secondary | ICD-10-CM | POA: Diagnosis not present

## 2014-08-24 DIAGNOSIS — Z833 Family history of diabetes mellitus: Secondary | ICD-10-CM

## 2014-08-24 DIAGNOSIS — Z79899 Other long term (current) drug therapy: Secondary | ICD-10-CM

## 2014-08-24 DIAGNOSIS — E785 Hyperlipidemia, unspecified: Secondary | ICD-10-CM | POA: Diagnosis present

## 2014-08-24 DIAGNOSIS — I872 Venous insufficiency (chronic) (peripheral): Secondary | ICD-10-CM | POA: Diagnosis present

## 2014-08-24 DIAGNOSIS — R600 Localized edema: Secondary | ICD-10-CM | POA: Diagnosis not present

## 2014-08-24 DIAGNOSIS — Z800 Family history of malignant neoplasm of digestive organs: Secondary | ICD-10-CM | POA: Diagnosis not present

## 2014-08-24 DIAGNOSIS — J189 Pneumonia, unspecified organism: Secondary | ICD-10-CM | POA: Diagnosis not present

## 2014-08-24 DIAGNOSIS — R918 Other nonspecific abnormal finding of lung field: Secondary | ICD-10-CM

## 2014-08-24 DIAGNOSIS — Z823 Family history of stroke: Secondary | ICD-10-CM

## 2014-08-24 DIAGNOSIS — I2510 Atherosclerotic heart disease of native coronary artery without angina pectoris: Secondary | ICD-10-CM | POA: Diagnosis not present

## 2014-08-24 DIAGNOSIS — D630 Anemia in neoplastic disease: Secondary | ICD-10-CM | POA: Diagnosis present

## 2014-08-24 DIAGNOSIS — C50911 Malignant neoplasm of unspecified site of right female breast: Secondary | ICD-10-CM | POA: Diagnosis present

## 2014-08-24 DIAGNOSIS — G4733 Obstructive sleep apnea (adult) (pediatric): Secondary | ICD-10-CM | POA: Diagnosis present

## 2014-08-24 DIAGNOSIS — M1990 Unspecified osteoarthritis, unspecified site: Secondary | ICD-10-CM | POA: Diagnosis present

## 2014-08-24 DIAGNOSIS — R040 Epistaxis: Secondary | ICD-10-CM | POA: Diagnosis not present

## 2014-08-24 DIAGNOSIS — M7989 Other specified soft tissue disorders: Secondary | ICD-10-CM | POA: Diagnosis present

## 2014-08-24 DIAGNOSIS — E119 Type 2 diabetes mellitus without complications: Secondary | ICD-10-CM | POA: Diagnosis not present

## 2014-08-24 LAB — COMPREHENSIVE METABOLIC PANEL
ALT: 13 U/L — ABNORMAL LOW (ref 14–54)
AST: 28 U/L (ref 15–41)
Albumin: 2.5 g/dL — ABNORMAL LOW (ref 3.5–5.0)
Alkaline Phosphatase: 77 U/L (ref 38–126)
Anion gap: 11 (ref 5–15)
BUN: 31 mg/dL — ABNORMAL HIGH (ref 6–20)
CHLORIDE: 105 mmol/L (ref 101–111)
CO2: 24 mmol/L (ref 22–32)
CREATININE: 1.51 mg/dL — AB (ref 0.44–1.00)
Calcium: 6.5 mg/dL — ABNORMAL LOW (ref 8.9–10.3)
GFR calc Af Amer: 39 mL/min — ABNORMAL LOW (ref >60)
GFR, EST NON AFRICAN AMERICAN: 34 mL/min — AB (ref >60)
Glucose, Bld: 104 mg/dL — ABNORMAL HIGH (ref 65–99)
Potassium: 3.1 mmol/L — ABNORMAL LOW (ref 3.5–5.1)
Sodium: 140 mmol/L (ref 135–145)
Total Bilirubin: 0.6 mg/dL (ref 0.3–1.2)
Total Protein: 7.2 g/dL (ref 6.5–8.1)

## 2014-08-24 LAB — CBC WITH DIFFERENTIAL/PLATELET
BASOS ABS: 0 10*3/uL (ref 0.0–0.1)
Basophils Relative: 0 % (ref 0–1)
Eosinophils Absolute: 0.1 10*3/uL (ref 0.0–0.7)
Eosinophils Relative: 2 % (ref 0–5)
HCT: 30.8 % — ABNORMAL LOW (ref 36.0–46.0)
HEMOGLOBIN: 9.9 g/dL — AB (ref 12.0–15.0)
LYMPHS ABS: 1.2 10*3/uL (ref 0.7–4.0)
Lymphocytes Relative: 19 % (ref 12–46)
MCH: 27.3 pg (ref 26.0–34.0)
MCHC: 32.1 g/dL (ref 30.0–36.0)
MCV: 85.1 fL (ref 78.0–100.0)
MONOS PCT: 11 % (ref 3–12)
Monocytes Absolute: 0.7 10*3/uL (ref 0.1–1.0)
NEUTROS PCT: 68 % (ref 43–77)
Neutro Abs: 4.2 10*3/uL (ref 1.7–7.7)
Platelets: 161 10*3/uL (ref 150–400)
RBC: 3.62 MIL/uL — ABNORMAL LOW (ref 3.87–5.11)
RDW: 16 % — ABNORMAL HIGH (ref 11.5–15.5)
WBC: 6.3 10*3/uL (ref 4.0–10.5)

## 2014-08-24 LAB — BASIC METABOLIC PANEL
ANION GAP: 15 (ref 5–15)
BUN: 32 mg/dL — ABNORMAL HIGH (ref 6–20)
CO2: 23 mmol/L (ref 22–32)
Calcium: 6.8 mg/dL — ABNORMAL LOW (ref 8.9–10.3)
Chloride: 102 mmol/L (ref 101–111)
Creatinine, Ser: 1.56 mg/dL — ABNORMAL HIGH (ref 0.44–1.00)
GFR calc Af Amer: 37 mL/min — ABNORMAL LOW (ref >60)
GFR, EST NON AFRICAN AMERICAN: 32 mL/min — AB (ref >60)
Glucose, Bld: 100 mg/dL — ABNORMAL HIGH (ref 65–99)
POTASSIUM: 3 mmol/L — AB (ref 3.5–5.1)
SODIUM: 140 mmol/L (ref 135–145)

## 2014-08-24 LAB — CBC
HCT: 29.9 % — ABNORMAL LOW (ref 36.0–46.0)
Hemoglobin: 9.8 g/dL — ABNORMAL LOW (ref 12.0–15.0)
MCH: 28.3 pg (ref 26.0–34.0)
MCHC: 32.8 g/dL (ref 30.0–36.0)
MCV: 86.4 fL (ref 78.0–100.0)
Platelets: 169 10*3/uL (ref 150–400)
RBC: 3.46 MIL/uL — AB (ref 3.87–5.11)
RDW: 16.1 % — ABNORMAL HIGH (ref 11.5–15.5)
WBC: 6.9 10*3/uL (ref 4.0–10.5)

## 2014-08-24 LAB — PHOSPHORUS: PHOSPHORUS: 2.1 mg/dL — AB (ref 2.5–4.6)

## 2014-08-24 LAB — BRAIN NATRIURETIC PEPTIDE: B NATRIURETIC PEPTIDE 5: 478.7 pg/mL — AB (ref 0.0–100.0)

## 2014-08-24 LAB — GLUCOSE, CAPILLARY
GLUCOSE-CAPILLARY: 98 mg/dL (ref 65–99)
Glucose-Capillary: 94 mg/dL (ref 65–99)

## 2014-08-24 LAB — INFLUENZA PANEL BY PCR (TYPE A & B)
H1N1 flu by pcr: NOT DETECTED
Influenza A By PCR: NEGATIVE
Influenza B By PCR: NEGATIVE

## 2014-08-24 LAB — PROTIME-INR
INR: 1.26 (ref 0.00–1.49)
Prothrombin Time: 15.9 seconds — ABNORMAL HIGH (ref 11.6–15.2)

## 2014-08-24 LAB — STREP PNEUMONIAE URINARY ANTIGEN: STREP PNEUMO URINARY ANTIGEN: NEGATIVE

## 2014-08-24 LAB — MAGNESIUM: MAGNESIUM: 1.4 mg/dL — AB (ref 1.7–2.4)

## 2014-08-24 LAB — I-STAT TROPONIN, ED: Troponin i, poc: 0 ng/mL (ref 0.00–0.08)

## 2014-08-24 LAB — APTT: aPTT: 43 seconds — ABNORMAL HIGH (ref 24–37)

## 2014-08-24 MED ORDER — PANTOPRAZOLE SODIUM 40 MG PO TBEC
40.0000 mg | DELAYED_RELEASE_TABLET | Freq: Every day | ORAL | Status: DC
  Administered 2014-08-24 – 2014-09-01 (×9): 40 mg via ORAL
  Filled 2014-08-24 (×9): qty 1

## 2014-08-24 MED ORDER — POTASSIUM CHLORIDE CRYS ER 20 MEQ PO TBCR
40.0000 meq | EXTENDED_RELEASE_TABLET | Freq: Once | ORAL | Status: AC
  Administered 2014-08-24: 40 meq via ORAL
  Filled 2014-08-24: qty 2

## 2014-08-24 MED ORDER — SENNOSIDES-DOCUSATE SODIUM 8.6-50 MG PO TABS
1.0000 | ORAL_TABLET | Freq: Every day | ORAL | Status: DC
  Administered 2014-08-24 – 2014-09-02 (×10): 1 via ORAL
  Filled 2014-08-24 (×10): qty 1

## 2014-08-24 MED ORDER — ASPIRIN 81 MG PO CHEW
81.0000 mg | CHEWABLE_TABLET | Freq: Every day | ORAL | Status: DC
  Administered 2014-08-24 – 2014-09-03 (×11): 81 mg via ORAL
  Filled 2014-08-24 (×11): qty 1

## 2014-08-24 MED ORDER — AMLODIPINE BESYLATE 10 MG PO TABS
10.0000 mg | ORAL_TABLET | Freq: Every day | ORAL | Status: DC
  Administered 2014-08-24 – 2014-09-03 (×10): 10 mg via ORAL
  Filled 2014-08-24 (×11): qty 1

## 2014-08-24 MED ORDER — ACETAMINOPHEN 325 MG PO TABS
650.0000 mg | ORAL_TABLET | Freq: Once | ORAL | Status: AC
  Administered 2014-08-24: 650 mg via ORAL
  Filled 2014-08-24: qty 2

## 2014-08-24 MED ORDER — DEXTROSE 5 % IV SOLN
1.0000 g | Freq: Two times a day (BID) | INTRAVENOUS | Status: DC
  Administered 2014-08-24: 1 g via INTRAVENOUS
  Filled 2014-08-24: qty 1

## 2014-08-24 MED ORDER — CALCIUM CARBONATE ANTACID 500 MG PO CHEW
1.0000 | CHEWABLE_TABLET | Freq: Three times a day (TID) | ORAL | Status: DC
  Administered 2014-08-24 – 2014-09-03 (×29): 200 mg via ORAL
  Filled 2014-08-24 (×29): qty 1

## 2014-08-24 MED ORDER — EVEROLIMUS 5 MG PO TABS: 5.0000 mg | ORAL_TABLET | Freq: Every day | ORAL | Status: DC

## 2014-08-24 MED ORDER — GABAPENTIN 100 MG PO CAPS
100.0000 mg | ORAL_CAPSULE | Freq: Three times a day (TID) | ORAL | Status: DC
  Administered 2014-08-24 – 2014-09-03 (×30): 100 mg via ORAL
  Filled 2014-08-24 (×30): qty 1

## 2014-08-24 MED ORDER — INSULIN ISOPHANE & REGULAR (HUMAN 70-30)100 UNIT/ML KWIKPEN: 10.0000 [IU] | PEN_INJECTOR | Freq: Two times a day (BID) | SUBCUTANEOUS | Status: DC

## 2014-08-24 MED ORDER — LEVOTHYROXINE SODIUM 25 MCG PO TABS
137.0000 ug | ORAL_TABLET | Freq: Every day | ORAL | Status: DC
  Administered 2014-08-25 – 2014-09-03 (×10): 137 ug via ORAL
  Filled 2014-08-24 (×19): qty 1

## 2014-08-24 MED ORDER — INSULIN ASPART PROT & ASPART (70-30 MIX) 100 UNIT/ML ~~LOC~~ SUSP
10.0000 [IU] | Freq: Every day | SUBCUTANEOUS | Status: DC
  Administered 2014-08-24: 10 [IU] via SUBCUTANEOUS

## 2014-08-24 MED ORDER — CLOPIDOGREL BISULFATE 75 MG PO TABS
75.0000 mg | ORAL_TABLET | Freq: Every day | ORAL | Status: DC
  Administered 2014-08-24 – 2014-09-03 (×11): 75 mg via ORAL
  Filled 2014-08-24 (×11): qty 1

## 2014-08-24 MED ORDER — INSULIN ASPART PROT & ASPART (70-30 MIX) 100 UNIT/ML ~~LOC~~ SUSP
20.0000 [IU] | Freq: Every day | SUBCUTANEOUS | Status: DC
  Administered 2014-08-25: 20 [IU] via SUBCUTANEOUS
  Filled 2014-08-24: qty 10

## 2014-08-24 MED ORDER — IPRATROPIUM-ALBUTEROL 0.5-2.5 (3) MG/3ML IN SOLN: 3.0000 mL | RESPIRATORY_TRACT | Status: DC | PRN

## 2014-08-24 MED ORDER — VITAMIN D 1000 UNITS PO TABS
1000.0000 [IU] | ORAL_TABLET | Freq: Every day | ORAL | Status: DC
  Administered 2014-08-24 – 2014-09-03 (×11): 1000 [IU] via ORAL
  Filled 2014-08-24 (×11): qty 1

## 2014-08-24 MED ORDER — HYDROCHLOROTHIAZIDE 25 MG PO TABS
25.0000 mg | ORAL_TABLET | Freq: Every day | ORAL | Status: DC
  Administered 2014-08-24 – 2014-08-26 (×3): 25 mg via ORAL
  Filled 2014-08-24 (×3): qty 1

## 2014-08-24 MED ORDER — ISOSORBIDE MONONITRATE ER 30 MG PO TB24
60.0000 mg | ORAL_TABLET | Freq: Two times a day (BID) | ORAL | Status: DC
  Administered 2014-08-24 – 2014-09-03 (×21): 60 mg via ORAL
  Filled 2014-08-24 (×21): qty 2

## 2014-08-24 MED ORDER — SODIUM CHLORIDE 0.9 % IV SOLN
INTRAVENOUS | Status: DC
  Administered 2014-08-24: 14:00:00 via INTRAVENOUS

## 2014-08-24 MED ORDER — CEFTAZIDIME 1 G IJ SOLR
1.0000 g | Freq: Two times a day (BID) | INTRAMUSCULAR | Status: DC
  Administered 2014-08-24 – 2014-08-27 (×6): 1 g via INTRAVENOUS
  Filled 2014-08-24 (×7): qty 1

## 2014-08-24 MED ORDER — DEXTROSE 5 % IV SOLN: 2.0000 g | Freq: Three times a day (TID) | INTRAVENOUS | Status: DC

## 2014-08-24 MED ORDER — VANCOMYCIN HCL 10 G IV SOLR
1250.0000 mg | INTRAVENOUS | Status: DC
  Administered 2014-08-24: 1250 mg via INTRAVENOUS
  Filled 2014-08-24: qty 1250

## 2014-08-24 MED ORDER — POLYETHYLENE GLYCOL 3350 17 G PO PACK
17.0000 g | PACK | Freq: Every day | ORAL | Status: DC | PRN
  Administered 2014-08-27 – 2014-09-01 (×3): 17 g via ORAL
  Filled 2014-08-24 (×3): qty 1

## 2014-08-24 MED ORDER — CARVEDILOL 25 MG PO TABS
25.0000 mg | ORAL_TABLET | Freq: Two times a day (BID) | ORAL | Status: DC
  Administered 2014-08-25 – 2014-08-28 (×7): 25 mg via ORAL
  Filled 2014-08-24 (×7): qty 1

## 2014-08-24 MED ORDER — DEXTROSE 5 % IV SOLN: 1.0000 g | Freq: Three times a day (TID) | INTRAVENOUS | Status: DC

## 2014-08-24 MED ORDER — PROCHLORPERAZINE MALEATE 10 MG PO TABS
10.0000 mg | ORAL_TABLET | Freq: Four times a day (QID) | ORAL | Status: DC | PRN
  Administered 2014-08-24 – 2014-08-28 (×3): 10 mg via ORAL
  Filled 2014-08-24 (×3): qty 1

## 2014-08-24 MED ORDER — DEXTROSE 5 % IV SOLN: 1.0000 g | INTRAVENOUS | Status: DC

## 2014-08-24 MED ORDER — VANCOMYCIN HCL 10 G IV SOLR
1250.0000 mg | INTRAVENOUS | Status: DC
  Administered 2014-08-25 – 2014-08-27 (×3): 1250 mg via INTRAVENOUS
  Filled 2014-08-24 (×3): qty 1250

## 2014-08-24 MED ORDER — ATORVASTATIN CALCIUM 40 MG PO TABS
80.0000 mg | ORAL_TABLET | Freq: Every day | ORAL | Status: DC
  Administered 2014-08-24 – 2014-09-03 (×11): 80 mg via ORAL
  Filled 2014-08-24 (×11): qty 2

## 2014-08-24 MED ORDER — EXEMESTANE 25 MG PO TABS
25.0000 mg | ORAL_TABLET | Freq: Every day | ORAL | Status: DC
  Administered 2014-08-25 – 2014-09-03 (×10): 25 mg via ORAL
  Filled 2014-08-24 (×14): qty 1

## 2014-08-24 MED ORDER — MORPHINE SULFATE ER 30 MG PO TBCR
30.0000 mg | EXTENDED_RELEASE_TABLET | Freq: Two times a day (BID) | ORAL | Status: DC
  Administered 2014-08-24 – 2014-09-03 (×21): 30 mg via ORAL
  Filled 2014-08-24 (×21): qty 1

## 2014-08-24 NOTE — Progress Notes (Signed)
OFFICE VISIT  08/24/2014   CC:  Chief Complaint  Patient presents with  . Hospitalization Follow-up    for Pneumonia     HPI:    Patient is a 74 y.o. Caucasian female who presents for hosp f/u 7/11-7/13, 2016.   Admitted for hypocalcemia; has primary dx of breast ca metastatic to bone, anemia related to this malignancy/chronic disease DM 2, HTN, and hypothyroidism.  New finding in hospital of elevated serum calcitonin level.  Hemoglobin,Phos, Mag, K+ levels low.  Also had acute on chronic renal failure. She received one unit of pRBCs 08/12/14 and has not yet had a post-transfusion Hb. Blood abnormalities improved with replacement + IVFs and pt felt improved and wanted to go home. She was also dx'd with bibasilar pneumonia and treated with abx--sent home on levaquin.. Imdur and MS contin were d/c'd at discharge home. She was d/c'd home on 1 500 mg tums tid + 1 750 mg tums tab tid.  Since d/c from hosp she has had ongoing cough, SOB, loss of appetite, fatigue.  Her LE edema has been stable/chronic.  She finished a course of levaquin that she was d/c'd home on. Gluc this at 83.  She is adjusting her insulin dose some. No chest pain or chest pressure.  No abd pain or distention.  +Nausea but no vomiting.  Past Medical History  Diagnosis Date  . 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 Depression   . GERD (gastroesophageal reflux disease)   . Hyperlipidemia   . Hypothyroidism   . Osteoarthritis   . Carotid artery stenosis     bilateral  . CAD (coronary artery disease)     medical therapy, old tot RCA, patent LAD, mod. severe ostial LCX stenosis--PCI would be too complex-med mgmt recommended.  . OSA (obstructive sleep apnea)   . Hypertension   . Anxiety   . Carotid artery occlusion   . Unspecified constipation 03/15/2012  . Bursitis   . Collagen vascular disease   . History of radiation therapy 10/31/12-11/13/12    rt&lt humerous 30Gy/61f  .  Valvular heart disease 07/11/2013  . Diarrhea 09/28/2013  . Chronic renal insufficiency, stage II (mild)     Borderline stage II/III, CrCl 50s/60s.  . History of blood transfusion   . Urinary tract bacterial infections   . Breast cancer 05/2009    Grade 2 invasive ductal carcinoma, T2 NXM1, stage IV at presentation. There was bone only involvement.   . Diabetes mellitus type II     insulin  . Hypocalcemia 07/2014    required IV calcium in ED once and was admitted to hosp once.  ? secondary to [facility]?    Past Surgical History  Procedure Laterality Date  . Incisional breast biopsy      remoted left breast biopsy  . Cesarean section    . Other surgical history      GYN surgery  . Knee surgery      right knee surgery  . Carotid endarterectomy  2011    right carotid  . Port-a-cath removal    . Modified radical mastectomy  Feb 2012    Right breast  . Carotid stent      left  . Cardiac catheterization  06/2013    old total occ. of RCA, moderately severe ostial LCX stenosis- medical therapy- would be complex PCI  . Carotid angiogram N/A 03/01/2011    Procedure: CAROTID ANGIOGRAM;  Surgeon: BAaron Edelman  Starlyn Skeans, MD;  Location: [facility] CATH LAB;  Service: Cardiovascular;  Laterality: N/A;  . Arch aortogram  03/01/2011    Procedure: ARCH AORTOGRAM;  Surgeon: Conrad Franklin, MD;  Location: [facility] CATH LAB;  Service: Cardiovascular;;  . Carotid stent insertion Left 03/15/2011    Procedure: CAROTID STENT INSERTION;  Surgeon: Serafina Mitchell, MD;  Location: [facility] CATH LAB;  Service: Cardiovascular;  Laterality: Left;  . Left heart catheterization with coronary angiogram N/A 07/07/2013    Procedure: LEFT HEART CATHETERIZATION WITH CORONARY ANGIOGRAM;  Surgeon: Blane Ohara, MD;  Location: [facility] CATH LAB;  Service: Cardiovascular;  Laterality: N/A;  . Carotid dopplers  05/2014    Patent ICAs, bilat ECA dz: no change compared to 2014    Outpatient Prescriptions Prior to Visit  Medication Sig Dispense Refill   . ACCU-CHEK AVIVA PLUS test strip CHECK BLOOD SUGAR TWICE A DAY FOR DX:250.00 100 each 11  . amLODipine (NORVASC) 10 MG tablet TAKE ONE TABLET BY MOUTH EVERY DAY 30 tablet 6  . aspirin 81 MG chewable tablet Chew 81 mg by mouth daily.    Marland Kitchen atorvastatin (LIPITOR) 80 MG tablet Take 1 tablet (80 mg total) by mouth daily. 30 tablet 11  . Blood Glucose Monitoring Suppl (ACCU-CHEK NANO SMARTVIEW) W/DEVICE KIT Use to check blood sugar twice a day. DX 250.00 1 kit 0  . calcium carbonate (TUMS EX) 750 MG chewable tablet Chew 1 tablet by mouth 3 (three) times daily.    . calcium carbonate (TUMS) 500 MG chewable tablet Chew 1 tablet (200 mg of elemental calcium total) by mouth 3 (three) times daily with meals. 30 tablet 0  . carvedilol (COREG) 25 MG tablet Take 1 tablet (25 mg total) by mouth 2 (two) times daily with a meal. 60 tablet 6  . clopidogrel (PLAVIX) 75 MG tablet Take 1 tablet (75 mg total) by mouth daily. 30 tablet 8  . cyanocobalamin (,VITAMIN B-12,) 1000 MCG/ML injection Inject 1,000 mcg into the muscle every 30 (thirty) days.     . darbepoetin (ARANESP) 200 MCG/0.4ML SOLN Inject 200 mcg into the skin every 14 (fourteen) days.     Marland Kitchen everolimus (AFINITOR) 5 MG tablet Take 1 tablet (5 mg total) by mouth daily. 30 tablet 6  . exemestane (AROMASIN) 25 MG tablet Take 1 tablet (25 mg total) by mouth daily after breakfast. 30 tablet 2  . gabapentin (NEURONTIN) 100 MG capsule Take 1 capsule (100 mg total) by mouth 3 (three) times daily. 90 capsule 2  . glycerin adult (GLYCERIN ADULT) 2 G SUPP Place 1 suppository rectally once as needed (constipation). 10 suppository 0  . hydrochlorothiazide (MICROZIDE) 12.5 MG capsule Take 2 capsules (25 mg total) by mouth daily. 60 capsule 5  . Insulin Isophane & Regular Human (HUMULIN 70/30 MIX) (70-30) 100 UNIT/ML PEN 20 U sub Q qAM and 10 U subQ qPM 15 mL 11  . isosorbide mononitrate (IMDUR) 60 MG 24 hr tablet Take 1 tablet (60 mg total) by mouth 2 (two) times daily.  60 tablet 11  . levothyroxine (SYNTHROID, LEVOTHROID) 137 MCG tablet Take 1 tablet (137 mcg total) by mouth daily before breakfast. 30 tablet 2  . morphine (MS CONTIN) 30 MG 12 hr tablet Take 1 tablet (30 mg total) by mouth 2 (two) times daily. 60 tablet 0  . nitroGLYCERIN (NITROSTAT) 0.4 MG SL tablet Place 1 tablet (0.4 mg total) under the tongue every 5 (five) minutes as needed for chest pain. 30 tablet 0  .  omeprazole (PRILOSEC) 40 MG capsule Take 1 capsule (40 mg total) by mouth daily. 30 capsule 3  . polyethylene glycol (MIRALAX / GLYCOLAX) packet Take 17 g by mouth daily as needed for mild constipation.     . prochlorperazine (COMPAZINE) 10 MG tablet Take 1 tablet (10 mg total) by mouth every 6 (six) hours as needed for nausea or vomiting. 30 tablet 1  . senna-docusate (SENOKOT-S) 8.6-50 MG per tablet Take 1 tablet by mouth at bedtime.     Marland Kitchen levofloxacin (LEVAQUIN) 250 MG tablet Take 1 tablet (250 mg total) by mouth daily. (Patient not taking: Reported on 08/24/2014) 7 tablet 0   No facility-administered medications prior to visit.    Allergies  Allergen Reactions  . Chlorhexidine Hives, Itching and Rash    This was most likely a CONTACT DERMATITIS versus true systemic allergic reaction  . Adhesive [Tape] Hives  . Codeine Swelling  . Latex     ROS As per HPI  PE: Blood pressure 146/69, pulse 90, temperature 97.9 F (36.6 C), temperature source Oral, resp. rate 18, height _0  (1.6 m), weight 199 lb (90.266 kg), last menstrual period 03/13/2012, SpO2 84 %.  84% on RA. Sat 97 % on 2 L facemask. Gen: Alert, tired- appearing.  Patient is oriented to person, place, time, and situation. AFFECT: pleasant, lucid thought and speech. LUNGS: bibasilar insp crackles to about 1/2 way up bilat, nonlabored resps. CV: Regular, tachy to 110, no m/r/g ABD: soft/NT/ND EXT: 2-3+ pitting edema in both LL's up to knees, without skin breakdown.  LABS:   Lab Results  Component Value Date    HGBA1C 7.9* 06/10/2014   Serum calcitonin on 08/11/14 was 8.1 (range 0--5) Phosphorus level 1.4 on 08/11/14 Mag 1.5 on 08/10/14  Calcium on admission 6.1.  Calcium at d/c 8.4 (albumin 2.5)   Lab Results  Component Value Date   TSH 4.654* 08/10/2014   Lab Results  Component Value Date   WBC 5.2 08/12/2014   HGB 7.7* 08/12/2014   HCT 23.7* 08/12/2014   MCV 89.1 08/12/2014   PLT 181 08/12/2014   Lab Results  Component Value Date   CREATININE 1.28* 08/12/2014   BUN 20 08/12/2014   NA 136 08/12/2014   K 4.9 08/12/2014   CL 103 08/12/2014   CO2 24 08/12/2014   Lab Results  Component Value Date   ALT 12* 08/12/2014   AST 20 08/12/2014   ALKPHOS 84 08/12/2014   BILITOT 0.7 08/12/2014   Lab Results  Component Value Date   CHOL 134 03/02/2014   Lab Results  Component Value Date   HDL 27.70* 03/02/2014   Lab Results  Component Value Date   LDLCALC 76 03/02/2014   Lab Results  Component Value Date   TRIG 151.0* 03/02/2014   Lab Results  Component Value Date   CHOLHDL 5 03/02/2014   PA/LAT CXR 08/10/14 in hosp:  IMPRESSION- New patchy bibasilar airspace opacities and small bilateral pleural effusions, suspicious for edema or atypical inflammation. The change from the CT of less than 1 month ago makes metastatic disease to the lungs unlikely. Diffuse osseous metastases are grossly stable.  IMPRESSION AND PLAN:  1) Persistent/recurrent bibasilar pneumonia, with hypoxia down into low 80s.  This comes up to 95-97% on 2 L oxygen.  She feels improved on oxygen. She needs admission to hosp for pneumonia with hypoxia, needs EMS non-emergent transport.    Needs repeat CMET, CBC, mag, phos, calcitonin. And repeat CXR  2) Metastatic  breast cancer (diffuse bone mets): followed by oncology.  3) Elevated serum calcitonin recently: repeat.  ? Need w/u for medullary thyroid cancer?  An After Visit Summary was printed and given to the patient.  FOLLOW UP: No Follow-up on  file.

## 2014-08-24 NOTE — Consult Note (Signed)
[place]  [phone] [phone]     ID: BERTINA GUTHRIDGE DOB: [birth year]  MR#: [id number]  JJK#:[id number]  Patient Care Team: [name], MD as PCP - General (Family Medicine) Chauncey Cruel, MD (Hematology and Oncology) Peter M Martinique, MD (Cardiology) Serafina Mitchell, MD as Consulting Physician (Vascular Surgery) PCP: [name], MD SU: Rolm Bookbinder OTHER MD: Teena Dunk, Kyung Rudd, Kevin Supple, Peter Martinique  CHIEF COMPLAINT: stage IV breast cancer  CURRENT TREATMENT: exemestane, everolimus, aranesp, B-12; denosumab (discontinued 07/16/2014)   BREAST CANCER HISTORY: Kelcie was diagnosed with right breast carcinoma in May of 2011, a biopsy showing a grade 2 invasive ductal carcinoma, T2 NXM1, stage IV at presentation. There was bone only involvement. Tumor was strongly ER and PR positive, HER-2/neu negative, with an MIP-1 of 26%.  She was treated neoadjuvantly with letrozole and zoledronic acid beginning in June of 2011. She is status post right modified radical mastectomy in February 2012 for a ypT2 ypN2, grade 1 invasive ductal carcinoma. Margins were negative.   Her subsequent history is as detailed below  INTERVAL HISTORY: Iliany was admitted 2 weeks ago with pneumonia, treated withstandard antibiotics. She went on a family visit to [facility] and her breathing deteriorated over the past few days. Today seh saw her PCP DR McGowen and he noted her worsening SOB and hypoxia. She was admitted with a presumptive diagnosis of viral PNA. Her calcium also remains low though she has been off denosumab approximately 6 weeks  REVIEW OF SYSTEMS: Robecca tells me she is breathing a bit better on O2. However she is nauseated. She does not feel like eating. She does not feel like she cna lie down (for a CT scan) this evening. Has had a hacking cough, non productive, and no fever. No more aches and pains than "my usual."  Family  (daughter [name]  and son in Sports coach) in room  PAST MEDICAL HISTORY: Past Medical History  Diagnosis Date  . 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 Depression   . GERD (gastroesophageal reflux disease)   . Hyperlipidemia   . Hypothyroidism   . Osteoarthritis   . Carotid artery stenosis     bilateral  . CAD (coronary artery disease)     medical therapy, old tot RCA, patent LAD, mod. severe ostial LCX stenosis--PCI would be too complex-med mgmt recommended.  . OSA (obstructive sleep apnea)   . Hypertension   . Anxiety   . Carotid artery occlusion   . Unspecified constipation 03/15/2012  . Bursitis   . Collagen vascular disease   . History of radiation therapy 10/31/12-11/13/12    rt&lt humerous 30Gy/29fx  . Valvular heart disease 07/11/2013  . Diarrhea 09/28/2013  . Chronic renal insufficiency, stage II (mild)     Borderline stage II/III, CrCl 50s/60s.  . History of blood transfusion   . Urinary tract bacterial infections   . Breast cancer 05/2009    Grade 2 invasive ductal carcinoma, T2 NXM1, stage IV at presentation. There was bone only involvement.   . Diabetes mellitus type II     insulin  . Hypocalcemia 07/2014    required IV calcium in ED once and was admitted to hosp once.  ? secondary to [facility] and everolimus?    PAST SURGICAL HISTORY: Past Surgical History  Procedure Laterality Date  . Incisional breast biopsy      remoted left breast biopsy  . Cesarean  section    . Other surgical history      GYN surgery  . Knee surgery      right knee surgery  . Carotid endarterectomy  2011    right carotid  . Port-a-cath removal    . Modified radical mastectomy  Feb 2012    Right breast  . Carotid stent      left  . Cardiac catheterization  06/2013    old total occ. of RCA, moderately severe ostial LCX stenosis- medical therapy- would be complex PCI  . Carotid angiogram N/A 03/01/2011    Procedure: CAROTID ANGIOGRAM;  Surgeon: Conrad [place], MD;   Location: [facility] CATH LAB;  Service: Cardiovascular;  Laterality: N/A;  . Arch aortogram  03/01/2011    Procedure: ARCH AORTOGRAM;  Surgeon: Conrad [place], MD;  Location: [facility] CATH LAB;  Service: Cardiovascular;;  . Carotid stent insertion Left 03/15/2011    Procedure: CAROTID STENT INSERTION;  Surgeon: Serafina Mitchell, MD;  Location: [facility] CATH LAB;  Service: Cardiovascular;  Laterality: Left;  . Left heart catheterization with coronary angiogram N/A 07/07/2013    Procedure: LEFT HEART CATHETERIZATION WITH CORONARY ANGIOGRAM;  Surgeon: Blane Ohara, MD;  Location: [facility] CATH LAB;  Service: Cardiovascular;  Laterality: N/A;  . Carotid dopplers  05/2014    Patent ICAs, bilat ECA dz: no change compared to 2014    FAMILY HISTORY Family History  Problem Relation Age of Onset  . Arthritis    . Coronary artery disease      first degree relative  . Stroke      first degree relative  . Diabetes Mother   . Heart disease Mother   . Hypertension Mother   . Cancer Mother   . Deep vein thrombosis Mother   . Colon cancer Neg Hx   . Stomach cancer Neg Hx   . Cancer Father     Pancreatic cancer  . Breast cancer Paternal Aunt 63    GYNECOLOGIC HISTORY:  Patient's last menstrual period was 03/13/2012. The patient is GX, P3. 74 First pregnancy to term age 49. She went through the change of life in her late 96s. She never took hormones.   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 Her husband, [name], is disabled secondary to a fall. He has difficulty with walking. He used to work for [employer] previously. The patient's son, [name], lives in [place], and works for [employer]. Daughter, [name], lives in [place], and works for [employer]. Daughter, [name], lives in [place], [place], and works as a Geologist, engineering. The patient attends [employer].    ADVANCED DIRECTIVES: discussed 08/24/2014. Patient agrees to a DNR; appropriate orders  written   HEALTH MAINTENANCE: History  Substance Use Topics  . Smoking status: Former Smoker -- 1.00 packs/day for 20 years    Types: Cigarettes    Quit date: 01/31/1988  . Smokeless tobacco: Never Used  . Alcohol Use: No     Colonoscopy:  PAP:  Bone density:  Lipid panel:  Allergies  Allergen Reactions  . Chlorhexidine Hives, Itching and Rash    This was most likely a CONTACT DERMATITIS versus true systemic allergic reaction  . Adhesive [Tape] Hives  . Codeine Swelling  . Latex Rash    Current Facility-Administered Medications  Medication Dose Route Frequency Provider Last Rate Last Dose  . 0.9 %  sodium chloride infusion   Intravenous Continuous Robbie Lis, MD 50 mL/hr at  08/24/14 1339    . amLODipine (NORVASC) tablet 10 mg  10 mg Oral Daily Robbie Lis, MD   10 mg at 08/24/14 1651  . aspirin chewable tablet 81 mg  81 mg Oral Daily Robbie Lis, MD   81 mg at 08/24/14 1650  . atorvastatin (LIPITOR) tablet 80 mg  80 mg Oral Daily Robbie Lis, MD   80 mg at 08/24/14 1651  . calcium carbonate (TUMS - dosed in mg elemental calcium) chewable tablet 200 mg of elemental calcium  1 tablet Oral TID WC Robbie Lis, MD   200 mg of elemental calcium at 08/24/14 1650  . carvedilol (COREG) tablet 25 mg  25 mg Oral BID WC Robbie Lis, MD   25 mg at 08/24/14 1726  . cefTAZidime (FORTAZ) 1 g in dextrose 5 % 50 mL IVPB  1 g Intravenous Q12H Berton Mount, RPH      . clopidogrel (PLAVIX) tablet 75 mg  75 mg Oral Daily Robbie Lis, MD   75 mg at 08/24/14 1651  . [START ON 08/25/2014] exemestane (AROMASIN) tablet 25 mg  25 mg Oral QPC breakfast Robbie Lis, MD      . gabapentin (NEURONTIN) capsule 100 mg  100 mg Oral TID Robbie Lis, MD   100 mg at 08/24/14 1651  . insulin aspart protamine- aspart (NOVOLOG MIX 70/30) injection 10 Units  10 Units Subcutaneous Q supper Robbie Lis, MD   10 Units at 08/24/14 1652  . [START ON 08/25/2014] insulin aspart protamine- aspart  (NOVOLOG MIX 70/30) injection 20 Units  20 Units Subcutaneous Q breakfast Robbie Lis, MD      . ipratropium-albuterol (DUONEB) 0.5-2.5 (3) MG/3ML nebulizer solution 3 mL  3 mL Nebulization Q4H PRN Robbie Lis, MD      . isosorbide mononitrate (IMDUR) 24 hr tablet 60 mg  60 mg Oral BID Robbie Lis, MD   60 mg at 08/24/14 1651  . [START ON 08/25/2014] levothyroxine (SYNTHROID, LEVOTHROID) tablet 137 mcg  137 mcg Oral QAC breakfast Robbie Lis, MD      . morphine (MS CONTIN) 12 hr tablet 30 mg  30 mg Oral BID Robbie Lis, MD   30 mg at 08/24/14 1650  . pantoprazole (PROTONIX) EC tablet 40 mg  40 mg Oral Daily Robbie Lis, MD   40 mg at 08/24/14 1650  . polyethylene glycol (MIRALAX / GLYCOLAX) packet 17 g  17 g Oral Daily PRN Robbie Lis, MD      . prochlorperazine (COMPAZINE) tablet 10 mg  10 mg Oral Q6H PRN Robbie Lis, MD      . senna-docusate (Senokot-S) tablet 1 tablet  1 tablet Oral QHS Robbie Lis, MD      . Derrill Memo ON 08/25/2014] vancomycin (VANCOCIN) 1,250 mg in sodium chloride 0.9 % 250 mL IVPB  1,250 mg Intravenous Q24H Berton Mount, RPH        OBJECTIVE: older White woman examined on edge of bed; O2 per [place] on Filed Vitals:   08/24/14 1343  BP: 149/66  Pulse: 86  Temp: 97.3 F (36.3 C)  Resp: 18     Body mass index is 35.79 kg/(m^2).    ECOG FS:2 - Symptomatic, <50% confined to bed  Ocular: Sclerae unicteric, pupils equal, round and reactive to light Ear-nose-throat: Oropharynx clear and moist Lymphatic: No cervical or supraclavicular adenopathy Lungs bibasilar rales Heart regular rate and rhythm Abd  positive bowel sounds Neuro: non-focal, well-oriented, appropriate affect Breasts: deferred   LAB RESULTS:  CMP     Component Value Date/Time   NA 140 08/24/2014 1410   NA 136 08/07/2014 1003   K 3.1* 08/24/2014 1410   K 3.5 08/07/2014 1003   CL 105 08/24/2014 1410   CL 104 07/08/2012 1023   CO2 24 08/24/2014 1410   CO2 22 08/07/2014 1003   GLUCOSE  104* 08/24/2014 1410   GLUCOSE 255* 08/07/2014 1003   GLUCOSE 157* 07/08/2012 1023   BUN 31* 08/24/2014 1410   BUN 17.3 08/07/2014 1003   CREATININE 1.51* 08/24/2014 1410   CREATININE 1.34* 08/10/2014 1145   CREATININE 1.1 08/07/2014 1003   CALCIUM 6.5* 08/24/2014 1410   CALCIUM 7.1* 08/07/2014 1003   PROT 7.2 08/24/2014 1410   PROT 7.3 08/07/2014 1003   ALBUMIN 2.5* 08/24/2014 1410   ALBUMIN 2.9* 08/07/2014 1003   AST 28 08/24/2014 1410   AST 27 08/07/2014 1003   ALT 13* 08/24/2014 1410   ALT 19 08/07/2014 1003   ALKPHOS 77 08/24/2014 1410   ALKPHOS 117 08/07/2014 1003   BILITOT 0.6 08/24/2014 1410   BILITOT 0.86 08/07/2014 1003   GFRNONAA 34* 08/24/2014 1410   GFRAA 39* 08/24/2014 1410    INo results found for: SPEP, UPEP  Lab Results  Component Value Date   WBC 6.3 08/24/2014   NEUTROABS 4.2 08/24/2014   HGB 9.9* 08/24/2014   HCT 30.8* 08/24/2014   MCV 85.1 08/24/2014   PLT 161 08/24/2014    '@LASTCHEMISTRY'$ @  Lab Results  Component Value Date   LABCA2 142* 07/14/2014    No components found for: LABCA125   Recent Labs Lab 08/24/14 1410  INR 1.26    Urinalysis    Component Value Date/Time   COLORURINE YELLOW 05/09/2014 1039   APPEARANCEUR TURBID* 05/09/2014 1039   LABSPEC 1.012 05/09/2014 1039   PHURINE 8.0 05/09/2014 1039   GLUCOSEU NEGATIVE 05/09/2014 1039   HGBUR SMALL* 05/09/2014 1039   BILIRUBINUR negative 05/22/2014 1042   BILIRUBINUR NEGATIVE 05/09/2014 1039   KETONESUR NEGATIVE 05/09/2014 1039   PROTEINUR negative 05/22/2014 1042   PROTEINUR 100* 05/09/2014 1039   UROBILINOGEN 0.2 05/22/2014 1042   UROBILINOGEN 1.0 05/09/2014 1039   NITRITE negative 05/22/2014 1042   NITRITE NEGATIVE 05/09/2014 1039   LEUKOCYTESUR Negative 05/22/2014 1042    STUDIES: Dg Chest 2 View  08/24/2014   CLINICAL DATA:  Increasing shortness of breath, history of carcinoma of the breast with bone metastatic disease  EXAM: CHEST - 2 VIEW  COMPARISON:   08/10/2014  FINDINGS: Cardiac shadow is stable. Increasing infiltrates are noted within both lungs when compare with the prior exam. Diffuse bony metastatic disease is seen. A right chest wall port is again noted.  IMPRESSION: Increasing bilateral infiltrates.   Electronically Signed   By: Inez Catalina M.D.   On: 08/24/2014 10:44   Dg Chest 2 View  08/10/2014   CLINICAL DATA:  Hypercalcemia with weakness, shortness breath and difficulty breathing. Initial encounter.  EXAM: CHEST  2 VIEW  COMPARISON:  Radiographs 04/13/2012.  CT 07/15/2014.  FINDINGS: The heart size and mediastinal contours are stable without definite adenopathy. There is diffuse atherosclerosis of the aorta and coronary arteries. There are new patchy bibasilar airspace opacities with small bilateral pleural effusions. Underlying extensive osseous metastatic disease is grossly stable without evidence of pathologic fracture. Right subclavian Port-A-Cath tip appears unchanged near the SVC right atrial junction.  IMPRESSION: New patchy bibasilar airspace opacities  and small bilateral pleural effusions, suspicious for edema or atypical inflammation. The change from the CT of less than 1 month ago makes metastatic disease to the lungs unlikely. Diffuse osseous metastases are grossly stable.   Electronically Signed   By: Richardean Sale M.D.   On: 08/10/2014 18:14   Dg Foot Complete Left  07/27/2014   CLINICAL DATA:  Dropped addition on the top of the foot 1 week ago. Pain and bruising of the metatarsal region.  EXAM: LEFT FOOT - COMPLETE 3+ VIEW  COMPARISON:  None.  FINDINGS: There is dorsal soft tissue swelling but no evidence of underlying fracture. No significant degenerative changes.  IMPRESSION: Dorsal soft tissue swelling.  No acute bony finding.   Electronically Signed   By: Nelson Chimes M.D.   On: 07/27/2014 15:20    ASSESSMENT: 74 y.o. [place] woman:   (1) Status post right breast biopsy in 05/2009 for a grade 2 invasive ductal  carcinoma, T2 NX M1, Stage IV, with bone-only involvement, strongly estrogen and progesterone receptor-positive, HER-2-negative with an MIB-1 of 26%.  (2) Neoadjuvantly she received Letrozole and zoledronic acid beginning in June of 2011 and underwent right modified radical mastectomy February of 2012 for a ypT2 ypN2, grade 1 invasive ductal carcinoma with negative margins.   (3) She completed radiation therapy in August of 2012.   (4) She has continued on Letrozole but was switched from Zoledronic acid to Denosumab Delton See) because of concerns regarding her serum creatinine. Delton See was being given every 8 weeks.  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  (6) symptomatic anemia, with creatinine clearance < 60 cc/min; Darbepoietin Q14d started 11/03/2011; received Feraheme 01/05/2012  (7) On subcutaneous B-12 supplementation monthly.  (8) Pain in left upper extremity with osseous metastasis, osteoarthritis, tendinopathy, and bursitis as confirmed by recent MRI.  (9) Anemia, multifactorial with renal disease, on Aranesp monthly, increased to every 2 weeks June 2016  (10) letrozole was discontinued in September 2014 with evidence of disease progression. She was started on fulvestrant injections, first given on 10/17/2012; stopped 06/03/2014 with progression  (11) exemestane and everolimus started 07/08/2014  PLAN: While it is possible that we are dealing with an unusual bacterial infection or a viral pneumonia, I am impressed with the worsening CXR as compared to 2 weeks ago. I think what we are seeing may be lymphangitic spread of the patient's cancer. We discussed this possibility and July and her family understands this is usually a terminal development, We discussed advanced directives and [place] is clear in case of a terminal event she would not want resuscitation,  intubation, or life support. I am accordingly writing a DNR order.  I will write for a chest CT to be done in AM if she feels able to undergo the procedure. It may help [place] on the diagnosis question. However lymphangitic spread of tumor is generally a diagnosis of exclusion (she does not feel she could undergo bronchoscopy). I would continue to treat potentially reversible lung infections.  In addition everolimus can rarely cause pneumonitis. I will stop that medication. The exemestane can be continued.  She has not had denosumab/Xgeva since mid-June. Nevertheless her calcium remains low. Will review her meds wa view to help normalize her calcium level.  Greatly appreciate your help to this patient!  The patient has a good understanding of the overall plan. She agrees with it. She knows  the goal of treatment in her case is cure. She will call with any problems that may develop before her next visit here.  Chauncey Cruel, MD   08/24/2014 6:59 PM Medical Oncology and Hematology [facility] [address] [place], [place] [address] Tel. [phone]    Fax. [phone]

## 2014-08-24 NOTE — ED Provider Notes (Signed)
CSN: [id number]     Arrival date & time 08/24/14  0160 History   First MD Initiated Contact with Patient 08/24/14 [phone]     Chief Complaint  Patient presents with  . Pneumonia   HPI Patient presents to the emergency room with hypoxia and shortness of breath. Patient states she was recently admitted to the hospital on July 12 because of shortness of breath. Patient states she was diagnosed with pneumonia. She was started on a course of anabiotic's. She was released from the hospital for little less than a week ago. Her symptoms had improved during her hospitalization. Over the last several days she has started to become more short of breath. She has been coughing more again. She's also had a poor appetite. She denies any issues with recurrent fever, nausea, or vomiting.  She has noticed that her legs are swollen for the last several months but was told she did not have evidence of heart failure during her previous hospitalization. Patient had an appt with  her primary doctor today to follow-up on her recent hospitalization. Her doctor noted that she was hypoxic in the office. The patient is not normally on any oxygen. EMS was called and she was transported to the emergency department. Past Medical History  Diagnosis Date  . 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 Depression   . GERD (gastroesophageal reflux disease)   . Hyperlipidemia   . Hypothyroidism   . Osteoarthritis   . Carotid artery stenosis     bilateral  . CAD (coronary artery disease)     medical therapy, old tot RCA, patent LAD, mod. severe ostial LCX stenosis--PCI would be too complex-med mgmt recommended.  . OSA (obstructive sleep apnea)   . Hypertension   . Anxiety   . Carotid artery occlusion   . Unspecified constipation 03/15/2012  . Bursitis   . Collagen vascular disease   . History of radiation therapy 10/31/12-11/13/12    rt&lt humerous 30Gy/37fx  . Valvular heart disease 07/11/2013  . Diarrhea  09/28/2013  . Chronic renal insufficiency, stage II (mild)     Borderline stage II/III, CrCl 50s/60s.  . History of blood transfusion   . Urinary tract bacterial infections   . Breast cancer 05/2009    Grade 2 invasive ductal carcinoma, T2 NXM1, stage IV at presentation. There was bone only involvement.   . Diabetes mellitus type II     insulin  . Hypocalcemia 07/2014    required IV calcium in ED once and was admitted to hosp once.  ? secondary to [facility] and everolimus?   Past Surgical History  Procedure Laterality Date  . Incisional breast biopsy      remoted left breast biopsy  . Cesarean section    . Other surgical history      GYN surgery  . Knee surgery      right knee surgery  . Carotid endarterectomy  2011    right carotid  . Port-a-cath removal    . Modified radical mastectomy  Feb 2012    Right breast  . Carotid stent      left  . Cardiac catheterization  06/2013    old total occ. of RCA, moderately severe ostial LCX stenosis- medical therapy- would be complex PCI  . Carotid angiogram N/A 03/01/2011    Procedure: CAROTID ANGIOGRAM;  Surgeon: Conrad [place], MD;  Location: [facility] CATH LAB;  Service: Cardiovascular;  Laterality: N/A;  . Arch  aortogram  03/01/2011    Procedure: ARCH AORTOGRAM;  Surgeon: Conrad [place], MD;  Location: [facility] CATH LAB;  Service: Cardiovascular;;  . Carotid stent insertion Left 03/15/2011    Procedure: CAROTID STENT INSERTION;  Surgeon: Serafina Mitchell, MD;  Location: [facility] CATH LAB;  Service: Cardiovascular;  Laterality: Left;  . Left heart catheterization with coronary angiogram N/A 07/07/2013    Procedure: LEFT HEART CATHETERIZATION WITH CORONARY ANGIOGRAM;  Surgeon: Blane Ohara, MD;  Location: [facility] CATH LAB;  Service: Cardiovascular;  Laterality: N/A;  . Carotid dopplers  05/2014    Patent ICAs, bilat ECA dz: no change compared to 2014   Family History  Problem Relation Age of Onset  . Arthritis    . Coronary artery disease      first degree relative  .  Stroke      first degree relative  . Diabetes Mother   . Heart disease Mother   . Hypertension Mother   . Cancer Mother   . Deep vein thrombosis Mother   . Colon cancer Neg Hx   . Stomach cancer Neg Hx   . Cancer Father     Pancreatic cancer  . Breast cancer Paternal Aunt 5   History  Substance Use Topics  . Smoking status: Former Smoker -- 1.00 packs/day for 20 years    Types: Cigarettes    Quit date: 01/31/1988  . Smokeless tobacco: Never Used  . Alcohol Use: No   OB History    No data available     Review of Systems  Constitutional: Negative for fever.  Respiratory: Positive for cough and shortness of breath.   Cardiovascular: Negative for chest pain.  Gastrointestinal: Negative for vomiting, abdominal pain and diarrhea.  Skin: Negative for rash.  All other systems reviewed and are negative.     Allergies  Chlorhexidine; Adhesive; Codeine; and Latex  Home Medications   Prior to Admission medications   Medication Sig Start Date End Date Taking? Authorizing Provider  ACCU-CHEK AVIVA PLUS test strip CHECK BLOOD SUGAR TWICE A DAY FOR DX:250.00 08/10/14  Yes [name], MD  amLODipine (NORVASC) 10 MG tablet TAKE ONE TABLET BY MOUTH EVERY DAY Patient taking differently: TAKE 10 MG BY MOUTH DAILY. 06/15/14  Yes [name], MD  aspirin 81 MG chewable tablet Chew 81 mg by mouth daily.   Yes Historical Provider, MD  atorvastatin (LIPITOR) 80 MG tablet Take 1 tablet (80 mg total) by mouth daily. 07/20/14  Yes Peter M Martinique, MD  Blood Glucose Monitoring Suppl (ACCU-CHEK NANO SMARTVIEW) W/DEVICE KIT Use to check blood sugar twice a day. DX 250.00 03/28/13  Yes Mosie Lukes, MD  calcium carbonate (TUMS) 500 MG chewable tablet Chew 1 tablet (200 mg of elemental calcium total) by mouth 3 (three) times daily with meals. 08/02/14  Yes [name], MD  carvedilol (COREG) 25 MG tablet Take 1 tablet (25 mg total) by mouth 2 (two) times daily with a meal. 02/19/14  Yes Peter  M Martinique, MD  clopidogrel (PLAVIX) 75 MG tablet Take 1 tablet (75 mg total) by mouth daily. 11/28/13  Yes Serafina Mitchell, MD  cyanocobalamin (,VITAMIN B-12,) 1000 MCG/ML injection Inject 1,000 mcg into the muscle every 30 (thirty) days.    Yes Historical Provider, MD  darbepoetin (ARANESP) 200 MCG/0.4ML SOLN Inject 200 mcg into the skin every 14 (fourteen) days.    Yes Historical Provider, MD  everolimus (AFINITOR) 5 MG tablet Take 1 tablet (5 mg total) by  mouth daily. 06/30/14  Yes Chauncey Cruel, MD  exemestane (AROMASIN) 25 MG tablet Take 1 tablet (25 mg total) by mouth daily after breakfast. 06/12/14  Yes Laurie Panda, NP  gabapentin (NEURONTIN) 100 MG capsule Take 1 capsule (100 mg total) by mouth 3 (three) times daily. 04/06/14  Yes Laurie Panda, NP  glycerin adult (GLYCERIN ADULT) 2 G SUPP Place 1 suppository rectally once as needed (constipation). 04/13/12  Yes Julianne Rice, MD  hydrochlorothiazide (MICROZIDE) 12.5 MG capsule Take 2 capsules (25 mg total) by mouth daily. 04/08/14  Yes Isaiah Serge, NP  ibuprofen (ADVIL,MOTRIN) 200 MG tablet Take 400 mg by mouth every 6 (six) hours as needed for headache, mild pain or moderate pain.   Yes Historical Provider, MD  Insulin Isophane & Regular Human (HUMULIN 70/30 MIX) (70-30) 100 UNIT/ML PEN 20 U sub Q qAM and 10 U subQ qPM Patient taking differently: Inject 10-20 Units into the skin 2 (two) times daily. 20 U sub Q qAM and 10 U subQ qPM 07/13/14  Yes [name], MD  isosorbide mononitrate (IMDUR) 60 MG 24 hr tablet Take 1 tablet (60 mg total) by mouth 2 (two) times daily. 08/13/14  Yes Peter M Martinique, MD  levothyroxine (SYNTHROID, LEVOTHROID) 137 MCG tablet Take 1 tablet (137 mcg total) by mouth daily before breakfast. 06/10/14  Yes [name], MD  morphine (MS CONTIN) 30 MG 12 hr tablet Take 1 tablet (30 mg total) by mouth 2 (two) times daily. 08/13/14  Yes Chauncey Cruel, MD  nitroGLYCERIN (NITROSTAT) 0.4 MG SL tablet  Place 1 tablet (0.4 mg total) under the tongue every 5 (five) minutes as needed for chest pain. 07/08/13  Yes Janece Canterbury, MD  omeprazole (PRILOSEC) 40 MG capsule Take 1 capsule (40 mg total) by mouth daily. 03/02/14  Yes Mosie Lukes, MD  polyethylene glycol (MIRALAX / GLYCOLAX) packet Take 17 g by mouth daily as needed for mild constipation.    Yes Historical Provider, MD  prochlorperazine (COMPAZINE) 10 MG tablet Take 1 tablet (10 mg total) by mouth every 6 (six) hours as needed for nausea or vomiting. 08/07/14  Yes Susanne Borders, NP  senna-docusate (SENOKOT-S) 8.6-50 MG per tablet Take 1 tablet by mouth at bedtime.    Yes Historical Provider, MD  levofloxacin (LEVAQUIN) 250 MG tablet Take 1 tablet (250 mg total) by mouth daily. Patient not taking: Reported on 08/24/2014 08/12/14   Theodis Blaze, MD   BP 145/54 mmHg  Pulse 88  Temp(Src) 98 F (36.7 C) (Oral)  Resp 19  SpO2 95%  LMP 03/13/2012 Physical Exam  Constitutional: She appears well-developed and well-nourished. No distress.  HENT:  Head: Normocephalic and atraumatic.  Right Ear: External ear normal.  Left Ear: External ear normal.  Eyes: Conjunctivae are normal. Right eye exhibits no discharge. Left eye exhibits no discharge. No scleral icterus.  Neck: Neck supple. No tracheal deviation present.  Cardiovascular: Normal rate, regular rhythm and intact distal pulses.   Pulmonary/Chest: Effort normal. No accessory muscle usage or stridor. No respiratory distress. She has no wheezes. She has rales in the right lower field and the left lower field.  Abdominal: Soft. Bowel sounds are normal. She exhibits no distension. There is no tenderness. There is no rebound and no guarding.  Musculoskeletal: She exhibits edema. She exhibits no tenderness.  Tense pitting edema bilateral lower extrem below the knees  Neurological: She is alert. She has normal strength. No cranial nerve deficit (  no facial droop, extraocular movements intact, no  slurred speech) or sensory deficit. She exhibits normal muscle tone. She displays no seizure activity. Coordination normal.  Skin: Skin is warm and dry. No rash noted.  Psychiatric: She has a normal mood and affect.  Nursing note and vitals reviewed.   ED Course  Procedures (including critical care time) Labs Review Labs Reviewed  BASIC METABOLIC PANEL - Abnormal; Notable for the following:    Potassium 3.0 (*)    Glucose, Bld 100 (*)    BUN 32 (*)    Creatinine, Ser 1.56 (*)    Calcium 6.8 (*)    GFR calc non Af Amer 32 (*)    GFR calc Af Amer 37 (*)    All other components within normal limits  CBC - Abnormal; Notable for the following:    RBC 3.46 (*)    Hemoglobin 9.8 (*)    HCT 29.9 (*)    RDW 16.1 (*)    All other components within normal limits  BRAIN NATRIURETIC PEPTIDE - Abnormal; Notable for the following:    B Natriuretic Peptide 478.7 (*)    All other components within normal limits  I-STAT TROPOININ, ED    Imaging Review Dg Chest 2 View  08/24/2014   CLINICAL DATA:  Increasing shortness of breath, history of carcinoma of the breast with bone metastatic disease  EXAM: CHEST - 2 VIEW  COMPARISON:  08/10/2014  FINDINGS: Cardiac shadow is stable. Increasing infiltrates are noted within both lungs when compare with the prior exam. Diffuse bony metastatic disease is seen. A right chest wall port is again noted.  IMPRESSION: Increasing bilateral infiltrates.   Electronically Signed   By: Inez Catalina M.D.   On: 08/24/2014 10:44     MDM   Final diagnoses:  Hypoxia  Pulmonary infiltrate  Hypokalemia  Hypocalcemia    Patient's chest x-ray shows increasing bilateral infiltrates. Patient has history of metastatic breast CA. She was previously admitted to the hospital for pneumonia. Chest x-ray today is worse than her previous x-ray. Her symptoms may be related to worsening pneumonia. CHF is a possibility as well.  She does have peripheral edema.  Lymphangitic spread of  tumor is a possibility as well. She does not have a significantly elevated BNP.   I will consult the medical service for admission. Patient may benefit from echocardiogram, further eval and treatment. I have ordered empiric antibiotic for treatment of healthcare associated pneumonia    Dorie Rank, MD 08/24/14 1158

## 2014-08-24 NOTE — Progress Notes (Signed)
ANTIBIOTIC CONSULT NOTE - INITIAL  Pharmacy Consult for vancomycin Indication: pneumonia  Allergies  Allergen Reactions  . Chlorhexidine Hives, Itching and Rash    This was most likely a CONTACT DERMATITIS versus true systemic allergic reaction  . Adhesive [Tape] Hives  . Codeine Swelling  . Latex     Patient Measurements:    Body Weight: 90.3kg  Vital Signs: Temp: 98 F (36.7 C) (07/25 1001) Temp Source: Oral (07/25 1001) BP: 157/58 mmHg (07/25 1001) Pulse Rate: 91 (07/25 1001) Intake/Output from previous day:   Intake/Output from this shift:    Labs:  Recent Labs  08/24/14 1042  WBC 6.9  HGB 9.8*  PLT 169  CREATININE 1.56*   Estimated Creatinine Clearance: 35.3 mL/min (by C-G formula based on Cr of 1.56). No results for input(s): VANCOTROUGH, VANCOPEAK, VANCORANDOM, GENTTROUGH, GENTPEAK, GENTRANDOM, TOBRATROUGH, TOBRAPEAK, TOBRARND, AMIKACINPEAK, AMIKACINTROU, AMIKACIN in the last 72 hours.   Microbiology: No results found for this or any previous visit (from the past 720 hour(s)).  Medical History: Past Medical History  Diagnosis Date  . 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 Depression   . GERD (gastroesophageal reflux disease)   . Hyperlipidemia   . Hypothyroidism   . Osteoarthritis   . Carotid artery stenosis     bilateral  . CAD (coronary artery disease)     medical therapy, old tot RCA, patent LAD, mod. severe ostial LCX stenosis--PCI would be too complex-med mgmt recommended.  . OSA (obstructive sleep apnea)   . Hypertension   . Anxiety   . Carotid artery occlusion   . Unspecified constipation 03/15/2012  . Bursitis   . Collagen vascular disease   . History of radiation therapy 10/31/12-11/13/12    rt&lt humerous 30Gy/82fx  . Valvular heart disease 07/11/2013  . Diarrhea 09/28/2013  . Chronic renal insufficiency, stage II (mild)     Borderline stage II/III, CrCl 50s/60s.  . History of blood transfusion   .  Urinary tract bacterial infections   . Breast cancer 05/2009    Grade 2 invasive ductal carcinoma, T2 NXM1, stage IV at presentation. There was bone only involvement.   . Diabetes mellitus type II     insulin  . Hypocalcemia 07/2014    required IV calcium in ED once and was admitted to hosp once.  ? secondary to [facility] and everolimus?    Assessment: 74 y.o.Patient presents to the emergency room with hypoxia and shortness of breath. Patient states she was recently admitted to the hospital on July 12 because of shortness of breath.  Pharmacy consulted to dose vancomycin for pneumonia.  7/25 << ceftazidime << 7/25 << vancomycin <<  Goal of Therapy:  Vancomycin trough level 15-20 mcg/ml vanc per renal function  Plan:  Vancomycin 1250mg  IV q24h Follow renal function, cultures, clinical course vanc tough at steady state as needed  [place] 08/24/2014, 11:35 AM Pager [phone]

## 2014-08-24 NOTE — Progress Notes (Signed)
Pre visit review using our clinic review tool, if applicable. No additional management support is needed unless otherwise documented below in the visit note. 

## 2014-08-24 NOTE — H&P (Signed)
[facility] History and Physical  Brittney Cunningham WIO:[id number] DOB: [birth year] DOA: 08/24/2014  Referring physician: ER physician: Dr. Dorie Rank PCP: [name], MD  Chief Complaint: shortness of breath   HPI:  74 year old female with past medical history of breast cancer with bone metastasis, anemia of chronic disease, diabetes, hypertension who was recently hospitalized for severe electrolyte abnormalities and was on empiric abx for pneumonia. Patient presented to primary care physician and reported that ever since discharge she thought that her shortness of breath is improving but she had ongoing cough, poor by mouth intake and was feeling very weak. Her shortness of breath and started to get worse. No reports of fevers at home. She just recently finished Levaquin. No reports of chest pain or palpitations. No abdominal pain, nausea or vomiting. No falls. No blood in the stool or urine.  In ED, patient was hemodynamically stable. Her oxygen saturation was as low as 75% on room air but has improved to 97% with nasal cannula oxygen support. Blood work was significant for hemoglobin of 9.8, potassium 3 and creatinine of 1.56. Chest x-ray showed increasing bilateral infiltrates. She was started on vancomycin and Zosyn for healthcare associated pneumonia.  Assessment & Plan    Principal problem: Acute respiratory failure with hypoxia / healthcare associated pneumonia - Hypoxia likely related to pneumonia. Chest x-ray on the admission revealed increasing bilateral infiltrates - Pneumonia admission order set placed - Follow-up on influenza, Legionella and strep pneumonia results - Follow-up respiratory culture results and blood culture results - Continue vancomycin and Zosyn started in ED - Continue oxygen support via nasal cannula to keep oxygen saturation above 90% - May use DuoNeb nebulizer as needed for shortness of breath.  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 - Nutrition consulted  Breast cancer metastasized to bone - Management per oncology  DVT prophylaxis - SCDs bilaterally  Radiological Exams on Admission: Dg Chest 2 View 08/24/2014  Increasing bilateral infiltrates.   Electronically Signed   By: Inez Catalina M.D.   On: 08/24/2014 10:44    EKG: I have personally reviewed EKG. EKG shows sinus rhythm   Code Status: Full Family Communication: Plan of care discussed with the patient  Disposition Plan: Admit for further evaluation  Leisa Lenz, MD  [facility] Pager [phone]  Time spent in minutes: 75 minutes  Review of Systems:  Constitutional: Negative for fever, chills and malaise/fatigue. Negative for diaphoresis.  HENT: Negative for hearing loss, ear pain, nosebleeds, congestion, sore throat, neck pain, tinnitus and ear discharge.   Eyes: Negative for blurred vision, double vision, photophobia, pain, discharge and redness.  Respiratory: per HPI.   Cardiovascular: Negative for chest pain, palpitations, orthopnea, claudication and leg swelling.  Gastrointestinal: Negative for nausea, vomiting and abdominal pain. Negative for heartburn, constipation, blood in  stool and melena.  Genitourinary: Negative for dysuria, urgency, frequency, hematuria and  flank pain.  Musculoskeletal: Negative for myalgias, back pain, joint pain and falls.  Skin: Negative for itching and rash.  Neurological: Negative for dizziness and weakness. Negative for tingling, tremors, sensory change, speech change, focal weakness, loss of consciousness and headaches.  Endo/Heme/Allergies: Negative for environmental allergies and polydipsia. Does not bruise/bleed easily.  Psychiatric/Behavioral: Negative for suicidal ideas. The patient is not nervous/anxious.      Past Medical History  Diagnosis Date  . 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 Depression   . GERD (gastroesophageal reflux disease)   . Hyperlipidemia   . Hypothyroidism   . Osteoarthritis   . Carotid artery stenosis     bilateral  . CAD (coronary artery disease)     medical therapy, old tot RCA, patent LAD, mod. severe ostial LCX stenosis--PCI would be too complex-med mgmt recommended.  . OSA (obstructive sleep apnea)   . Hypertension   . Anxiety   . Carotid artery occlusion   . Unspecified constipation 03/15/2012  . Bursitis   . Collagen vascular disease   . History of radiation therapy 10/31/12-11/13/12    rt&lt humerous 30Gy/29fx  . Valvular heart disease 07/11/2013  . Diarrhea 09/28/2013  . Chronic renal insufficiency, stage II (mild)     Borderline stage II/III, CrCl 50s/60s.  . History of blood transfusion   . Urinary tract bacterial infections   . Breast cancer 05/2009    Grade 2 invasive ductal carcinoma, T2 NXM1, stage IV at presentation. There was bone only involvement.   . Diabetes mellitus type II     insulin  . Hypocalcemia 07/2014    required IV calcium in ED once and was admitted to hosp once.  ? secondary to [facility] and everolimus?   Past Surgical History  Procedure Laterality Date  . Incisional breast biopsy      remoted left breast biopsy  . Cesarean section    . Other surgical history      GYN surgery  . Knee surgery      right knee surgery   . Carotid endarterectomy  2011    right carotid  . Port-a-cath removal    . Modified radical mastectomy  Feb 2012    Right breast  . Carotid stent      left  . Cardiac catheterization  06/2013    old total occ. of RCA, moderately severe ostial LCX stenosis- medical therapy- would be complex PCI  . Carotid angiogram N/A 03/01/2011    Procedure: CAROTID ANGIOGRAM;  Surgeon: Conrad [place], MD;  Location: [facility] CATH LAB;  Service: Cardiovascular;  Laterality: N/A;  . Arch aortogram  03/01/2011    Procedure: ARCH AORTOGRAM;  Surgeon: Conrad Upland, MD;  Location: [facility] CATH LAB;  Service: Cardiovascular;;  . Carotid stent insertion Left 03/15/2011    Procedure: CAROTID STENT INSERTION;  Surgeon: Serafina Mitchell, MD;  Location: [facility] CATH LAB;  Service: Cardiovascular;  Laterality: Left;  . Left heart catheterization with coronary angiogram N/A 07/07/2013    Procedure: LEFT HEART CATHETERIZATION WITH CORONARY ANGIOGRAM;  Surgeon: Blane Ohara, MD;  Location: [facility] CATH LAB;  Service: Cardiovascular;  Laterality: N/A;  . Carotid dopplers  05/2014    Patent ICAs, bilat ECA dz: no change compared to 2014   Social History:  reports that she quit smoking about 26 years ago. Her smoking use included Cigarettes. She has  a 20 pack-year smoking history. She has never used smokeless tobacco. She reports that she does not drink alcohol or use illicit drugs.  Allergies  Allergen Reactions  . Chlorhexidine Hives, Itching and Rash    This was most likely a CONTACT DERMATITIS versus true systemic allergic reaction  . Adhesive [Tape] Hives  . Codeine Swelling  . Latex Rash    Family History:  Family History  Problem Relation Age of Onset  . Arthritis    . Coronary artery disease      first degree relative  . Stroke      first degree relative  . Diabetes Mother   . Heart disease Mother   . Hypertension Mother   . Cancer Mother   . Deep vein thrombosis Mother   . Colon cancer Neg Hx   . Stomach cancer Neg Hx    . Cancer Father     Pancreatic cancer  . Breast cancer Paternal Aunt 27     Prior to Admission medications   Medication Sig Start Date End Date Taking? Authorizing Provider  ACCU-CHEK AVIVA PLUS test strip CHECK BLOOD SUGAR TWICE A DAY FOR DX:250.00 08/10/14  Yes [name], MD  amLODipine (NORVASC) 10 MG tablet TAKE ONE TABLET BY MOUTH EVERY DAY Patient taking differently: TAKE 10 MG BY MOUTH DAILY. 06/15/14  Yes [name], MD  aspirin 81 MG chewable tablet Chew 81 mg by mouth daily.   Yes Historical Provider, MD  atorvastatin (LIPITOR) 80 MG tablet Take 1 tablet (80 mg total) by mouth daily. 07/20/14  Yes Peter M Martinique, MD  Blood Glucose Monitoring Suppl (ACCU-CHEK NANO SMARTVIEW) W/DEVICE KIT Use to check blood sugar twice a day. DX 250.00 03/28/13  Yes Mosie Lukes, MD  calcium carbonate (TUMS) 500 MG chewable tablet Chew 1 tablet (200 mg of elemental calcium total) by mouth 3 (three) times daily with meals. 08/02/14  Yes [name], MD  carvedilol (COREG) 25 MG tablet Take 1 tablet (25 mg total) by mouth 2 (two) times daily with a meal. 02/19/14  Yes Peter M Martinique, MD  clopidogrel (PLAVIX) 75 MG tablet Take 1 tablet (75 mg total) by mouth daily. 11/28/13  Yes Serafina Mitchell, MD  cyanocobalamin (,VITAMIN B-12,) 1000 MCG/ML injection Inject 1,000 mcg into the muscle every 30 (thirty) days.    Yes Historical Provider, MD  darbepoetin (ARANESP) 200 MCG/0.4ML SOLN Inject 200 mcg into the skin every 14 (fourteen) days.    Yes Historical Provider, MD  everolimus (AFINITOR) 5 MG tablet Take 1 tablet (5 mg total) by mouth daily. 06/30/14  Yes Chauncey Cruel, MD  exemestane (AROMASIN) 25 MG tablet Take 1 tablet (25 mg total) by mouth daily after breakfast. 06/12/14  Yes Laurie Panda, NP  gabapentin (NEURONTIN) 100 MG capsule Take 1 capsule (100 mg total) by mouth 3 (three) times daily. 04/06/14  Yes Laurie Panda, NP  glycerin adult (GLYCERIN ADULT) 2 G SUPP Place 1  suppository rectally once as needed (constipation). 04/13/12  Yes Julianne Rice, MD  hydrochlorothiazide (MICROZIDE) 12.5 MG capsule Take 2 capsules (25 mg total) by mouth daily. 04/08/14  Yes Isaiah Serge, NP  ibuprofen (ADVIL,MOTRIN) 200 MG tablet Take 400 mg by mouth every 6 (six) hours as needed for headache, mild pain or moderate pain.   Yes Historical Provider, MD  Insulin Isophane & Regular Human (HUMULIN 70/30 MIX) (70-30) 100 UNIT/ML PEN 20 U sub Q qAM and 10 U subQ qPM Patient  taking differently: Inject 10-20 Units into the skin 2 (two) times daily. 20 U sub Q qAM and 10 U subQ qPM 07/13/14  Yes [name], MD  isosorbide mononitrate (IMDUR) 60 MG 24 hr tablet Take 1 tablet (60 mg total) by mouth 2 (two) times daily. 08/13/14  Yes Peter M Martinique, MD  levothyroxine (SYNTHROID, LEVOTHROID) 137 MCG tablet Take 1 tablet (137 mcg total) by mouth daily before breakfast. 06/10/14  Yes [name], MD  morphine (MS CONTIN) 30 MG 12 hr tablet Take 1 tablet (30 mg total) by mouth 2 (two) times daily. 08/13/14  Yes Chauncey Cruel, MD  nitroGLYCERIN (NITROSTAT) 0.4 MG SL tablet Place 1 tablet (0.4 mg total) under the tongue every 5 (five) minutes as needed for chest pain. 07/08/13  Yes Janece Canterbury, MD  omeprazole (PRILOSEC) 40 MG capsule Take 1 capsule (40 mg total) by mouth daily. 03/02/14  Yes Mosie Lukes, MD  polyethylene glycol (MIRALAX / GLYCOLAX) packet Take 17 g by mouth daily as needed for mild constipation.    Yes Historical Provider, MD  prochlorperazine (COMPAZINE) 10 MG tablet Take 1 tablet (10 mg total) by mouth every 6 (six) hours as needed for nausea or vomiting. 08/07/14  Yes Susanne Borders, NP  senna-docusate (SENOKOT-S) 8.6-50 MG per tablet Take 1 tablet by mouth at bedtime.    Yes Historical Provider, MD  levofloxacin (LEVAQUIN) 250 MG tablet Take 1 tablet (250 mg total) by mouth daily. Patient not taking: Reported on 08/24/2014 08/12/14   Theodis Blaze, MD   Physical  Exam: Filed Vitals:   08/24/14 1001 08/24/14 1008 08/24/14 1137  BP: 157/58  145/54  Pulse: 91  88  Temp: 98 F (36.7 C)    TempSrc: Oral    Resp: 20  19  SpO2: 75% 86% 95%    Physical Exam  Constitutional: Appears well-developed and well-nourished. No distress.  HENT: Normocephalic. No tonsillar erythema or exudates Eyes: Conjunctivae and EOM are normal. PERRLA, no scleral icterus.  Neck: Normal ROM. Neck supple. No JVD. No tracheal deviation. No thyromegaly.  CVS: RRR, S1/S2 appreciated  Pulmonary: Effort and breath sounds normal, no stridor, rhonchi, wheezes, rales.  Abdominal: Soft. BS +,  no distension, tenderness, rebound or guarding.  Musculoskeletal: Normal range of motion. No edema and no tenderness.  Lymphadenopathy: No lymphadenopathy noted, cervical, inguinal. Neuro: Alert. Normal reflexes, muscle tone coordination. No focal neurologic deficits. Skin: Skin is warm and dry. No rash noted.  No erythema. No pallor.  Psychiatric: Normal mood and affect. Behavior, judgment, thought content normal.   Labs on Admission:  Basic Metabolic Panel:  Recent Labs Lab 08/24/14 1042  NA 140  K 3.0*  CL 102  CO2 23  GLUCOSE 100*  BUN 32*  CREATININE 1.56*  CALCIUM 6.8*   Liver Function Tests: No results for input(s): AST, ALT, ALKPHOS, BILITOT, PROT, ALBUMIN in the last 168 hours. No results for input(s): LIPASE, AMYLASE in the last 168 hours. No results for input(s): AMMONIA in the last 168 hours. CBC:  Recent Labs Lab 08/24/14 1042  WBC 6.9  HGB 9.8*  HCT 29.9*  MCV 86.4  PLT 169   Cardiac Enzymes: No results for input(s): CKTOTAL, CKMB, CKMBINDEX, TROPONINI in the last 168 hours. BNP: Invalid input(s): POCBNP CBG: No results for input(s): GLUCAP in the last 168 hours.  If 7PM-7AM, please contact night-coverage [email or web address] Password [facility] 08/24/2014, 12:08 PM

## 2014-08-24 NOTE — ED Notes (Signed)
Bed: WA03 Expected date:  Expected time:  Means of arrival:  Comments: EMS-SOB 

## 2014-08-24 NOTE — ED Notes (Signed)
Patient here from PCP with c/o of pneumonia. 98% 2L. Does not wear O2 at home. AAOx4.

## 2014-08-25 ENCOUNTER — Other Ambulatory Visit: Payer: Self-pay

## 2014-08-25 ENCOUNTER — Inpatient Hospital Stay (HOSPITAL_COMMUNITY): Payer: Medicare Other

## 2014-08-25 ENCOUNTER — Encounter (HOSPITAL_COMMUNITY): Payer: Self-pay | Admitting: Radiology

## 2014-08-25 ENCOUNTER — Ambulatory Visit: Payer: Self-pay

## 2014-08-25 ENCOUNTER — Telehealth: Payer: Self-pay | Admitting: *Deleted

## 2014-08-25 DIAGNOSIS — E440 Moderate protein-calorie malnutrition: Secondary | ICD-10-CM | POA: Insufficient documentation

## 2014-08-25 LAB — CBC
HCT: 28.8 % — ABNORMAL LOW (ref 36.0–46.0)
HEMOGLOBIN: 9.2 g/dL — AB (ref 12.0–15.0)
MCH: 27.9 pg (ref 26.0–34.0)
MCHC: 31.9 g/dL (ref 30.0–36.0)
MCV: 87.3 fL (ref 78.0–100.0)
PLATELETS: 182 10*3/uL (ref 150–400)
RBC: 3.3 MIL/uL — AB (ref 3.87–5.11)
RDW: 16.5 % — ABNORMAL HIGH (ref 11.5–15.5)
WBC: 6.3 10*3/uL (ref 4.0–10.5)

## 2014-08-25 LAB — COMPREHENSIVE METABOLIC PANEL
ALBUMIN: 2.4 g/dL — AB (ref 3.5–5.0)
ALT: 12 U/L — AB (ref 14–54)
ANION GAP: 12 (ref 5–15)
AST: 28 U/L (ref 15–41)
Alkaline Phosphatase: 74 U/L (ref 38–126)
BUN: 27 mg/dL — AB (ref 6–20)
CALCIUM: 6.6 mg/dL — AB (ref 8.9–10.3)
CHLORIDE: 106 mmol/L (ref 101–111)
CO2: 24 mmol/L (ref 22–32)
Creatinine, Ser: 1.48 mg/dL — ABNORMAL HIGH (ref 0.44–1.00)
GFR calc Af Amer: 40 mL/min — ABNORMAL LOW (ref >60)
GFR calc non Af Amer: 34 mL/min — ABNORMAL LOW (ref >60)
Glucose, Bld: 92 mg/dL (ref 65–99)
Potassium: 3.7 mmol/L (ref 3.5–5.1)
Sodium: 142 mmol/L (ref 135–145)
Total Bilirubin: 0.4 mg/dL (ref 0.3–1.2)
Total Protein: 6.9 g/dL (ref 6.5–8.1)

## 2014-08-25 LAB — GLUCOSE, CAPILLARY
GLUCOSE-CAPILLARY: 134 mg/dL — AB (ref 65–99)
Glucose-Capillary: 73 mg/dL (ref 65–99)
Glucose-Capillary: 102 mg/dL — ABNORMAL HIGH (ref 65–99)
Glucose-Capillary: 25 mg/dL — CL (ref 65–99)
Glucose-Capillary: 109 mg/dL — ABNORMAL HIGH (ref 65–99)
Glucose-Capillary: 183 mg/dL — ABNORMAL HIGH (ref 65–99)

## 2014-08-25 MED ORDER — INSULIN ASPART PROT & ASPART (70-30 MIX) 100 UNIT/ML ~~LOC~~ SUSP: 15.0000 [IU] | Freq: Every day | SUBCUTANEOUS | Status: DC

## 2014-08-25 MED ORDER — SODIUM CHLORIDE 0.9 % IJ SOLN
10.0000 mL | Freq: Two times a day (BID) | INTRAMUSCULAR | Status: DC
Start: 2014-08-25 — End: 2014-09-03

## 2014-08-25 MED ORDER — FUROSEMIDE 20 MG PO TABS
20.0000 mg | ORAL_TABLET | Freq: Once | ORAL | Status: AC
  Administered 2014-08-25: 20 mg via ORAL
  Filled 2014-08-25: qty 1

## 2014-08-25 MED ORDER — DEXTROSE 50 % IV SOLN
INTRAVENOUS | Status: AC
  Administered 2014-08-25: 50 mL
  Filled 2014-08-25: qty 50

## 2014-08-25 MED ORDER — SODIUM CHLORIDE 0.9 % IJ SOLN
10.0000 mL | INTRAMUSCULAR | Status: DC | PRN
  Administered 2014-08-26 – 2014-09-03 (×8): 10 mL
  Filled 2014-08-25 (×8): qty 40

## 2014-08-25 MED ORDER — INSULIN ASPART 100 UNIT/ML ~~LOC~~ SOLN
0.0000 [IU] | Freq: Three times a day (TID) | SUBCUTANEOUS | Status: DC
  Administered 2014-08-26: 1 [IU] via SUBCUTANEOUS
  Administered 2014-08-26: 3 [IU] via SUBCUTANEOUS
  Administered 2014-08-27: 2 [IU] via SUBCUTANEOUS
  Administered 2014-08-27: 1 [IU] via SUBCUTANEOUS
  Administered 2014-08-27 – 2014-08-28 (×2): 2 [IU] via SUBCUTANEOUS
  Administered 2014-08-28: 3 [IU] via SUBCUTANEOUS
  Administered 2014-08-28: 1 [IU] via SUBCUTANEOUS
  Administered 2014-08-29 (×2): 2 [IU] via SUBCUTANEOUS
  Administered 2014-08-29: 1 [IU] via SUBCUTANEOUS
  Administered 2014-08-30 (×3): 2 [IU] via SUBCUTANEOUS
  Administered 2014-08-31: 7 [IU] via SUBCUTANEOUS

## 2014-08-25 MED ORDER — INSULIN ASPART PROT & ASPART (70-30 MIX) 100 UNIT/ML ~~LOC~~ SUSP: 5.0000 [IU] | Freq: Every day | SUBCUTANEOUS | Status: DC

## 2014-08-25 NOTE — Progress Notes (Signed)
Initial Nutrition Assessment  DOCUMENTATION CODES:   Non-severe (moderate) malnutrition in context of acute illness/injury, Obesity unspecified  INTERVENTION:  - Will continue to monitor for needs  NUTRITION DIAGNOSIS:   Inadequate oral intake related to poor appetite as evidenced by per patient/family report, meal completion < 50%.  GOAL:   Patient will meet greater than or equal to 90% of their needs  MONITOR:   PO intake, Weight trends, Labs, I & O's  REASON FOR ASSESSMENT:   Consult Diet education  ASSESSMENT:  74 year old female with past medical history of breast cancer with bone metastasis, anemia of chronic disease, diabetes, hypertension who was recently hospitalized for severe electrolyte abnormalities and was on empiric abx for pneumonia. Patient presented to primary care physician and reported that ever since discharge she thought that her shortness of breath is improving but she had ongoing cough, poor by mouth intake and was feeling very weak.  Pt seen for consult for education which is deferred for full assessment given malnutrition and poor appetite. BMI indicates obesity. Pt ate ~50% of breakfast this  AM which consisted of yogurt, homefries, and eggs. Pt reports that the sight of food makes her nauseated; occurs with all food rather than only specific foods.  She states that she has had a "very fair" appetite for the past 2 weeks and was not eating well at home. She states that if she is not given anti-nausea medication with meals she is unable to eat because nausea becomes worse after intakes but does not result in emesis.  She indicates that her weight fluctuates from 185-200 lbs. Confirmed by weight hx review. Moderate lower extremity edema noted and pt states this is not usual for her. No muscle or fat wasting noted.  Not meeting needs. She did not drink nutrition supplements PTA and is not interested in receiving them at this time. Medications reviewed. Labs  reviewed; BUN/creatinine elevated but trending down, Ca: 6.6 mg/dL, GFR: 34.   Diet Order:  Diet regular Room service appropriate?: Yes; Fluid consistency:: Thin  Skin:  Reviewed, no issues  Last BM:  PTA  Height:   Ht Readings from Last 1 Encounters:  08/24/14 5' 2.5" (1.588 m)    Weight:   Wt Readings from Last 1 Encounters:  08/24/14 198 lb 15.8 oz (90.26 kg)    Ideal Body Weight:  51.14 kg (kg)  Wt Readings from Last 10 Encounters:  08/24/14 198 lb 15.8 oz (90.26 kg)  08/24/14 199 lb (90.266 kg)  08/10/14 190 lb (86.183 kg)  08/10/14 187 lb 12.8 oz (85.186 kg)  08/07/14 192 lb 6.4 oz (87.272 kg)  08/02/14 196 lb (88.905 kg)  07/27/14 196 lb (88.905 kg)  07/13/14 194 lb (87.998 kg)  07/10/14 194 lb 8 oz (88.225 kg)  07/08/14 195 lb 4.8 oz (88.587 kg)    BMI:  Body mass index is 35.79 kg/(m^2).  Estimated Nutritional Needs:   Kcal:  1350-1550  Protein:  70-80 grams  Fluid:  1.7 L/day  EDUCATION NEEDS:   No education needs identified at this time     Jarome Matin, RD, LDN Inpatient Clinical Dietitian Pager # [phone] After hours/weekend pager # [phone]

## 2014-08-25 NOTE — Telephone Encounter (Signed)
Daughter Venita Sheffield called.  "Could Val or Dr. [name] call and let us know when he will come by to talk with mom about the scan results because we need to be there.  She had the scan this morning."  Advised that he is seeing patients all day today.  Possibility of a visit could be after hours if needed today.  "Could they call my mobile number [phone] and give me results if no visit planned."

## 2014-08-25 NOTE — Progress Notes (Signed)
CBG 24 rechecked 25 1 amp d50 given Skin WDI arousable

## 2014-08-25 NOTE — Progress Notes (Signed)
Chest Ct show extensive bilateral lung involvement with a patchy process which could be tumor but could be infectious or inflammatory (everolimus can cause pneumonitis). I have placed a consult to [facility] to see if they feel patient can tolerated a BAL for diagnosis, or if there are other options to try to resolved the differential.  Should we consult ID?  Appreciate Dr Arlyss Queen help to this patient!

## 2014-08-25 NOTE — Progress Notes (Signed)
Brittney Cunningham LMB:[id number] DOB: [birth year] DOA: 08/24/2014 PCP: [name], MD  Brief narrative: 74 y/o ? coronary artery disease, carotid artery stenosis status post R CEA and stenting in the left carotid, diabetes ty ii, htn, dyslipidemia, stage IV right breast carcinoma in May of 2011, a biopsy showing a grade 2 invasive ductal carcinoma, T2 NXM1, stage IV at presentation. There was bone only involvement. Tumor was strongly ER and PR positive, HER-2/neu negative, with an MIP-1 of 26% She is under the care of Dr. [name] and has rec'd multiple regimens-has had dx progression despite Letrozole + Fulvestrant Current Chemo=exemestane and everolimus started 07/08/2014 Was admitted 7/12-7/15 with AKI + dyselectrolytemia + possible bronchitis Rx symptomatically c Levaquin. Went to Daughter's home in [place] and felt progressively worse  and finally 2/2 to SOB and symptoms re-presented to Dr. Ernestine Conrad office and he found her sats in the [place] with ? WOB. Sent to ED and admitted 7/26   Past medical history-As per Problem list Chart reviewed as below-   Consultants:  Dr. [name] Oncology  Procedures:  Ct chest 7/26  Antibiotics:     Subjective  Fair desats to mid-80's without oxygen but not sympotmatic No sputum yet No cp,n,v,weakness No dark stool,tarry stool   Objective    Interim History:   Telemetry: Sinus/sinus tach   Objective: Filed Vitals:   08/24/14 1137 08/24/14 1343 08/24/14 2058 08/25/14 0452  BP: 145/54 149/66 152/60 114/52  Pulse: 88 86 83 89  Temp:  97.3 F (36.3 C) 97.9 F (36.6 C) 98.1 F (36.7 C)  TempSrc:  Oral Oral Oral  Resp: $Remo'19 18 18 16  'QRcAA$ Height:  5' 2.5" (1.588 m)    Weight:  90.26 kg (198 lb 15.8 oz)    SpO2: 95% 97% 91% 93%    Intake/Output Summary (Last 24 hours) at 08/25/14 1301 Last data filed at 08/25/14 0700  Gross per 24 hour  Intake 1647.5 ml  Output    950 ml  Net  697.5 ml    Exam:  General: pleasant lan,  no lymphad, cannot appreciate any thyroid mass. Cardiovascular: R sided portocath, s1 s2 no m/r/g, no jvd nor bruit Respiratory: crackles ++.  No tvr/tvf.  Percussion note wnl Abdomen: soft, nt Skin trace - grd 1 LE edema Neuro intact euthymic, power wnl  Data Reviewed: Basic Metabolic Panel:  Recent Labs Lab 08/24/14 1042 08/24/14 1410 08/25/14 0525  NA 140 140 142  K 3.0* 3.1* 3.7  CL 102 105 106  CO2 $Re'23 24 24  'ZJC$ GLUCOSE 100* 104* 92  BUN 32* 31* 27*  CREATININE 1.56* 1.51* 1.48*  CALCIUM 6.8* 6.5* 6.6*  MG  --  1.4*  --   PHOS  --  2.1*  --    Liver Function Tests:  Recent Labs Lab 08/24/14 1410 08/25/14 0525  AST 28 28  ALT 13* 12*  ALKPHOS 77 74  BILITOT 0.6 0.4  PROT 7.2 6.9  ALBUMIN 2.5* 2.4*   No results for input(s): LIPASE, AMYLASE in the last 168 hours. No results for input(s): AMMONIA in the last 168 hours. CBC:  Recent Labs Lab 08/24/14 1042 08/24/14 1410 08/25/14 0525  WBC 6.9 6.3 6.3  NEUTROABS  --  4.2  --   HGB 9.8* 9.9* 9.2*  HCT 29.9* 30.8* 28.8*  MCV 86.4 85.1 87.3  PLT 169 161 182   Cardiac Enzymes: No results for input(s): CKTOTAL, CKMB, CKMBINDEX, TROPONINI in the last 168 hours. BNP: Invalid input(s): POCBNP CBG:  Recent Labs Lab 08/24/14 1355 08/24/14 1643 08/25/14 0735 08/25/14 1228  GLUCAP 98 94 73 102*    No results found for this or any previous visit (from the past 240 hour(s)).   Studies:              All Imaging reviewed and is as per above notation   Scheduled Meds: . amLODipine  10 mg Oral Daily  . aspirin  81 mg Oral Daily  . atorvastatin  80 mg Oral Daily  . calcium carbonate  1 tablet Oral TID WC  . carvedilol  25 mg Oral BID WC  . cefTAZidime (FORTAZ)  IV  1 g Intravenous Q12H  . cholecalciferol  1,000 Units Oral Daily  . clopidogrel  75 mg Oral Daily  . exemestane  25 mg Oral QPC breakfast  . gabapentin  100 mg Oral TID  . hydrochlorothiazide  25 mg Oral Daily  . insulin aspart protamine-  aspart  10 Units Subcutaneous Q supper  . insulin aspart protamine- aspart  20 Units Subcutaneous Q breakfast  . isosorbide mononitrate  60 mg Oral BID  . levothyroxine  137 mcg Oral QAC breakfast  . morphine  30 mg Oral BID  . pantoprazole  40 mg Oral Daily  . senna-docusate  1 tablet Oral QHS  . sodium chloride  10-40 mL Intracatheter Q12H  . vancomycin  1,250 mg Intravenous Q24H   Continuous Infusions: . sodium chloride 50 mL/hr at 08/24/14 1339     Assessment/Plan:   Acute respiratory failure with hypoxia -DDX pneumonia/cancer spread -Unchanged and still hypoxic off of oxygen and will need home O2    Lymphangitic spread stg IV breast Ca Vs Viral PNA Etiology not delineated as yet -saline lock -trial lasix 20 po today and rpt films in am -Add flutter valve to expectorate sputum -Patient already has had what sounds like a full course for community-acquired pneumonia coverage--it is reasonable to continue vancomycin and Fortaz for one to 2 days and see if an improvement occurs -Get Respiratory Viral panel and keep droplet for now -given the fact that she has had no fever no chills, Err on side of possible malignancy related issue  Stage IV breast cancer status post mastectomy and chemotherapy -patient is very clear that she would NOT WANT any further aggressive work-up of this if we think this is non- infectious and related to her breast cancer Greatly appreciate input from oncology -thank you  Hypocalcemia, corrected calcium 7.9 Etiology unclear and potentially related to chemotherapy? Would not correct at this stage as albumen it corrects to 7.9 as above  Hypokalemia -Resolved with replacement. -Check magnesium levels in a.m. for completion sake  Diabetes mellitus type 2 -Blood sugars ranging 70 to 100 -Risk of hypoglycemia therefore cut back dosage 7030 as below A.m. dose 20-->15 P.m. dose 10-->5 Reported by Rn later in the day CBG 20's ? change to just SSI  coverage  CAD with total occlusion of RCA 06/2013 -On maximal medical therapy including amlodipine 10, Coreg 25 twice a day, Imdur 60 twice a day  HTN -continue HCTZ 25 daily Unclear if this was started recently? -Blood pressure is well controlled  Chronic kidney disease stage II to III Estimated Creatinine Clearance: 36.8 mL/min (by C-G formula based on Cr of 1.48).  -stable currently -monitor   Code Status: full  Family Communication: none Disposition Plan: inpatient pending resolution   Verneita Griffes, MD  [facility] Pager [phone] 08/25/2014, 1:01 PM    LOS:  1 day

## 2014-08-25 NOTE — Evaluation (Signed)
Physical Therapy Evaluation Patient Details Name: Brittney Cunningham MRN: [id number] DOB: Oct 13, 1940 Today's Date: 08/25/2014   History of Present Illness  74 year old female with past medical history of breast cancer with bone metastasis, anemia of chronic disease, diabetes, hypertension who was recently hospitalized for severe electrolyte abnormalities and was on empiric abx for pneumonia admitted with Acute respiratory failure with hypoxia / healthcare associated pneumonia  Clinical Impression  Pt admitted with above diagnosis. Pt currently with functional limitations due to the deficits listed below (see PT Problem List).  Pt will benefit from skilled PT to increase their independence and safety with mobility to allow discharge to the venue listed below.  Pt lives with spouse and reports he has appointment with doctor today, "he has cancer too."  Pt also with recent admission and reports recent falls at home, unable to recall, therefore SNF recommended unless pt able to have increased home care/assist.  Pt currently with limited mobility due to fatigue and SOB, also requiring supplemental oxygen at this time.    SATURATION QUALIFICATIONS: (This note is used to comply with regulatory documentation for home oxygen)  Patient Saturations on Room Air at Rest = 86%  Patient Saturations on Room Air while Ambulating = N/A  Patient Saturations on 2 Liters of oxygen while Ambulating = 92%  Please briefly explain why patient needs home oxygen: to maintain oxygen saturations above 88% at rest and during functional activities.     Follow Up Recommendations SNF;Supervision/Assistance - 24 hour (unless more assist available at home other then spouse)    Equipment Recommendations  None recommended by PT    Recommendations for Other Services       Precautions / Restrictions Precautions Precautions: Fall Precaution Comments: monitor sats Restrictions Weight Bearing Restrictions: No       Mobility  Bed Mobility               General bed mobility comments: pt sitting EOB on arrival  Transfers Overall transfer level: Needs assistance Equipment used: Rolling walker (2 wheeled) Transfers: Sit to/from Stand Sit to Stand: Min guard         General transfer comment: close guard for safety  Ambulation/Gait Ambulation/Gait assistance: Min assist Ambulation Distance (Feet): 20 Feet Assistive device: None (pushed IV pole) Gait Pattern/deviations: Step-through pattern;Decreased stride length     General Gait Details: slow gait speed, used IV pole for support (typically uses cane), ambulated on 2L O2 with SPO2 92%, distance limited by fatigue  Stairs            Wheelchair Mobility    Modified Rankin (Stroke Patients Only)       Balance Overall balance assessment: History of Falls (reports a couple falls in the past few weeks, unable to recall events or cause)         Standing balance support: During functional activity;Single extremity supported Standing balance-Leahy Scale: Fair                               Pertinent Vitals/Pain Pain Assessment: No/denies pain    Home Living Family/patient expects to be discharged to:: Private residence Living Arrangements: Spouse/significant other Available Help at Discharge: Family Type of Home: House Home Access: Ramped entrance     Home Layout: Able to live on main level with bedroom/bathroom;Laundry or work area in [place]: Environmental consultant - 2 wheels;Cane - single point;Shower seat;Hand held shower head  Prior Function Level of Independence: Independent with assistive device(s)         Comments: uses cane for ambulation at baseline     Hand Dominance        Extremity/Trunk Assessment   Upper Extremity Assessment: Generalized weakness           Lower Extremity Assessment: Generalized weakness      Cervical / Trunk Assessment: Kyphotic  Communication    Communication: No difficulties  Cognition Arousal/Alertness: Awake/alert Behavior During Therapy: WFL for tasks assessed/performed Overall Cognitive Status: Within Functional Limits for tasks assessed                      General Comments      Exercises        Assessment/Plan    PT Assessment    PT Diagnosis Difficulty walking;Generalized weakness   PT Problem List Decreased strength;Decreased activity tolerance;Decreased balance;Decreased knowledge of use of DME;Pain;Cardiopulmonary status limiting activity;Decreased mobility  PT Treatment Interventions DME instruction;Gait training;Functional mobility training;Therapeutic activities;Therapeutic exercise;Patient/family education;Balance training   PT Goals (Current goals can be found in the Care Plan section) Acute Rehab PT Goals PT Goal Formulation: With patient Time For Goal Achievement: 09/01/14 Potential to Achieve Goals: Good    Frequency Min 3X/week   Barriers to discharge        Co-evaluation               End of Session Equipment Utilized During Treatment: Gait belt Activity Tolerance: Patient limited by fatigue Patient left: in bed;with call bell/phone within reach           Time: 0034-9179 PT Time Calculation (min) (ACUTE ONLY): 12 min   Charges:   PT Evaluation $Initial PT Evaluation Tier I: 1 Procedure     PT G Codes:        Marshun Duva,KATHrine E 08/25/2014, 10:37 AM Carmelia Bake, PT, DPT 08/25/2014 Pager: [phone]

## 2014-08-25 NOTE — Progress Notes (Signed)
CBG now 134  MD notified new orders received

## 2014-08-26 ENCOUNTER — Ambulatory Visit: Payer: Medicare Other | Admitting: Family Medicine

## 2014-08-26 DIAGNOSIS — N189 Chronic kidney disease, unspecified: Secondary | ICD-10-CM

## 2014-08-26 DIAGNOSIS — D631 Anemia in chronic kidney disease: Secondary | ICD-10-CM

## 2014-08-26 DIAGNOSIS — C7951 Secondary malignant neoplasm of bone: Secondary | ICD-10-CM

## 2014-08-26 DIAGNOSIS — Z170 Estrogen receptor positive status [ER+]: Secondary | ICD-10-CM

## 2014-08-26 DIAGNOSIS — E440 Moderate protein-calorie malnutrition: Secondary | ICD-10-CM

## 2014-08-26 DIAGNOSIS — Z79811 Long term (current) use of aromatase inhibitors: Secondary | ICD-10-CM

## 2014-08-26 LAB — CBC WITH DIFFERENTIAL/PLATELET
BASOS ABS: 0 10*3/uL (ref 0.0–0.1)
Basophils Relative: 0 % (ref 0–1)
EOS ABS: 0.2 10*3/uL (ref 0.0–0.7)
Eosinophils Relative: 4 % (ref 0–5)
HEMATOCRIT: 23.8 % — AB (ref 36.0–46.0)
Hemoglobin: 7.5 g/dL — ABNORMAL LOW (ref 12.0–15.0)
Lymphocytes Relative: 27 % (ref 12–46)
Lymphs Abs: 1.3 10*3/uL (ref 0.7–4.0)
MCH: 27.6 pg (ref 26.0–34.0)
MCHC: 31.5 g/dL (ref 30.0–36.0)
MCV: 87.5 fL (ref 78.0–100.0)
MONOS PCT: 12 % (ref 3–12)
Monocytes Absolute: 0.6 10*3/uL (ref 0.1–1.0)
Neutro Abs: 2.7 10*3/uL (ref 1.7–7.7)
Neutrophils Relative %: 57 % (ref 43–77)
Platelets: 129 10*3/uL — ABNORMAL LOW (ref 150–400)
RBC: 2.72 MIL/uL — ABNORMAL LOW (ref 3.87–5.11)
RDW: 16.7 % — ABNORMAL HIGH (ref 11.5–15.5)
WBC: 4.7 10*3/uL (ref 4.0–10.5)

## 2014-08-26 LAB — COMPREHENSIVE METABOLIC PANEL
ALT: 12 U/L — ABNORMAL LOW (ref 14–54)
ANION GAP: 12 (ref 5–15)
AST: 29 U/L (ref 15–41)
Albumin: 2.1 g/dL — ABNORMAL LOW (ref 3.5–5.0)
Alkaline Phosphatase: 70 U/L (ref 38–126)
BUN: 26 mg/dL — ABNORMAL HIGH (ref 6–20)
CALCIUM: 6.3 mg/dL — AB (ref 8.9–10.3)
CHLORIDE: 104 mmol/L (ref 101–111)
CO2: 22 mmol/L (ref 22–32)
CREATININE: 1.46 mg/dL — AB (ref 0.44–1.00)
GFR calc Af Amer: 41 mL/min — ABNORMAL LOW (ref >60)
GFR calc non Af Amer: 35 mL/min — ABNORMAL LOW (ref >60)
GLUCOSE: 147 mg/dL — AB (ref 65–99)
Potassium: 3.6 mmol/L (ref 3.5–5.1)
Sodium: 138 mmol/L (ref 135–145)
TOTAL PROTEIN: 5.9 g/dL — AB (ref 6.5–8.1)
Total Bilirubin: 0.6 mg/dL (ref 0.3–1.2)

## 2014-08-26 LAB — GLUCOSE, CAPILLARY
GLUCOSE-CAPILLARY: 112 mg/dL — AB (ref 65–99)
GLUCOSE-CAPILLARY: 127 mg/dL — AB (ref 65–99)
Glucose-Capillary: 218 mg/dL — ABNORMAL HIGH (ref 65–99)
Glucose-Capillary: 147 mg/dL — ABNORMAL HIGH (ref 65–99)
Glucose-Capillary: 140 mg/dL — ABNORMAL HIGH (ref 65–99)

## 2014-08-26 LAB — LEGIONELLA ANTIGEN, URINE

## 2014-08-26 LAB — LEGIONELLA PNEUMOPHILA TOTAL AB: Serogroups 2,3,4,5,6,8: <1:16 {titer}

## 2014-08-26 LAB — PROTIME-INR
INR: 1.24 (ref 0.00–1.49)
Prothrombin Time: 15.8 seconds — ABNORMAL HIGH (ref 11.6–15.2)

## 2014-08-26 MED ORDER — FUROSEMIDE 10 MG/ML IJ SOLN
40.0000 mg | Freq: Once | INTRAMUSCULAR | Status: AC
  Administered 2014-08-26: 40 mg via INTRAVENOUS
  Filled 2014-08-26: qty 4

## 2014-08-26 MED ORDER — POTASSIUM CHLORIDE CRYS ER 20 MEQ PO TBCR
40.0000 meq | EXTENDED_RELEASE_TABLET | Freq: Every day | ORAL | Status: DC
  Administered 2014-08-26 – 2014-09-03 (×9): 40 meq via ORAL
  Filled 2014-08-26 (×9): qty 2

## 2014-08-26 MED ORDER — FUROSEMIDE 10 MG/ML IJ SOLN
40.0000 mg | Freq: Two times a day (BID) | INTRAMUSCULAR | Status: DC
Start: 2014-08-26 — End: 2014-08-27
  Administered 2014-08-26 – 2014-08-27 (×2): 40 mg via INTRAVENOUS
  Filled 2014-08-26 (×2): qty 4

## 2014-08-26 MED ORDER — SODIUM CHLORIDE 0.9 % IV SOLN
2.0000 g | Freq: Once | INTRAVENOUS | Status: AC
  Administered 2014-08-26: 2 g via INTRAVENOUS
  Filled 2014-08-26: qty 20

## 2014-08-26 NOTE — Care Management Important Message (Signed)
Important Message  Patient Details  Name: [name] MRN: [id number] Date of Birth: 09-25-1940   Medicare Important Message Given:  [facility] notification given    Camillo Flaming 08/26/2014, 12:47 [place] Message  Patient Details  Name: [name] MRN: [id number] Date of Birth: 08-04-1940   Medicare Important Message Given:  Yes-second notification given    Camillo Flaming 08/26/2014, 12:47 PM

## 2014-08-26 NOTE — Progress Notes (Signed)
Family at bedside; Performs Flutter valve well;EOB X 3 today new IS given and instructed

## 2014-08-26 NOTE — Progress Notes (Signed)
Brittney Cunningham   DOB:December 30, 1940   ZH#:[id number]   MHD#:[id number]  Subjective: Brittney Cunningham is in bed, tells me she is tired, but it was a good day, lots of visitors. She thinks her breathing is better. No epistaxis, hemoptysis, hematuria, BRBPR or melena. No family in room   Objective: elderly White woman examined in bed  Filed Vitals:   08/26/14 1515  BP: 125/43  Pulse: 90  Temp: 98.4 F (36.9 C)  Resp: 20    Body mass index is 35.79 kg/(m^2).  Intake/Output Summary (Last 24 hours) at 08/26/14 1951 Last data filed at 08/26/14 1900  Gross per 24 hour  Intake   1440 ml  Output   1650 ml  Net   -210 ml     CBG (last 3)   Recent Labs  08/26/14 0741 08/26/14 1154 08/26/14 1650  GLUCAP 127* 218* 147*     Labs:  Lab Results  Component Value Date   WBC 4.7 08/26/2014   HGB 7.5* 08/26/2014   HCT 23.8* 08/26/2014   MCV 87.5 08/26/2014   PLT 129* 08/26/2014   NEUTROABS 2.7 08/26/2014    _0 @  Urine Studies No results for input(s): UHGB, CRYS in the last 72 hours.  Invalid input(s): UACOL, UAPR, USPG, UPH, UTP, UGL, UKET, UBIL, UNIT, UROB, [place], UEPI, UWBC, [place], [place], [employer], [place], [place]  Basic Metabolic Panel:  Recent Labs Lab 08/24/14 1042 08/24/14 1410 08/25/14 0525 08/26/14 0550  NA 140 140 142 138  K 3.0* 3.1* 3.7 3.6  CL 102 105 106 104  CO2 _1 GLUCOSE 100* 104* 92 147*  BUN 32* 31* 27* 26*  CREATININE 1.56* 1.51* 1.48* 1.46*  CALCIUM 6.8* 6.5* 6.6* 6.3*  MG  --  1.4*  --   --   PHOS  --  2.1*  --   --    GFR Estimated Creatinine Clearance: 37.3 mL/min (by C-G formula based on Cr of 1.46). Liver Function Tests:  Recent Labs Lab 08/24/14 1410 08/25/14 0525 08/26/14 0550  AST _2 ALT 13* 12* 12*  ALKPHOS 77 74 70  BILITOT 0.6 0.4 0.6  PROT 7.2 6.9 5.9*  ALBUMIN 2.5* 2.4* 2.1*   No results for input(s): LIPASE, AMYLASE in the last 168 hours. No results for input(s): AMMONIA in the last 168 hours. Coagulation  profile  Recent Labs Lab 08/24/14 1410 08/26/14 0550  INR 1.26 1.24    CBC:  Recent Labs Lab 08/24/14 1042 08/24/14 1410 08/25/14 0525 08/26/14 0550  WBC 6.9 6.3 6.3 4.7  NEUTROABS  --  4.2  --  2.7  HGB 9.8* 9.9* 9.2* 7.5*  HCT 29.9* 30.8* 28.8* 23.8*  MCV 86.4 85.1 87.3 87.5  PLT 169 161 182 129*   Cardiac Enzymes: No results for input(s): CKTOTAL, CKMB, CKMBINDEX, TROPONINI in the last 168 hours. BNP: Invalid input(s): POCBNP CBG:  Recent Labs Lab 08/25/14 2104 08/26/14 0409 08/26/14 0741 08/26/14 1154 08/26/14 1650  GLUCAP 183* 112* 127* 218* 147*   D-Dimer No results for input(s): DDIMER in the last 72 hours. Hgb A1c No results for input(s): HGBA1C in the last 72 hours. Lipid Profile No results for input(s): CHOL, HDL, LDLCALC, TRIG, CHOLHDL, LDLDIRECT in the last 72 hours. Thyroid function studies No results for input(s): TSH, T4TOTAL, T3FREE, THYROIDAB in the last 72 hours.  Invalid input(s): FREET3 Anemia work up No results for input(s): VITAMINB12, FOLATE, FERRITIN, TIBC, IRON, RETICCTPCT in the last 72 hours. Microbiology Recent Results (from the  past 240 hour(s))  Culture, blood (routine x 2) Call MD if unable to obtain prior to antibiotics being given     Status: None (Preliminary result)   Collection Time: 08/24/14  2:00 PM  Result Value Ref Range Status   Specimen Description BLOOD LEFT HAND  Final   Special Requests IN PEDIATRIC BOTTLE 4CC  Final   Culture   Final    NO GROWTH 2 DAYS Performed at [facility]    Report Status PENDING  Incomplete  Culture, blood (routine x 2) Call MD if unable to obtain prior to antibiotics being given     Status: None (Preliminary result)   Collection Time: 08/24/14  2:10 PM  Result Value Ref Range Status   Specimen Description BLOOD LEFT ARM  Final   Special Requests BOTTLES DRAWN AEROBIC AND ANAEROBIC 10CC  Final   Culture   Final    NO GROWTH 2 DAYS Performed at [facility]     Report Status PENDING  Incomplete      Studies:  Ct Chest Wo Contrast  08/25/2014   CLINICAL DATA:  Shortness of breath, pneumonia, mastectomy.  EXAM: CT CHEST WITHOUT CONTRAST  TECHNIQUE: Multidetector CT imaging of the chest was performed following the standard protocol without IV contrast.  COMPARISON:  Radiograph 08/24/2014, CT 07/15/2014  FINDINGS: Mediastinum/Nodes: There is no axillary or supraclavicular lymphadenopathy. Port in the right chest wall. No mediastinal hilar lymphadenopathy. No pericardial fluid. Esophagus normal. Coronary calcification and aortic calcification.  Lungs/Pleura: There is a diffuse dense airspace disease with air bronchograms involving the upper lobes and lower lobes. There is some consolidated nodularity in the lower lobes. For example 2 cm nodule at the left lung base on image 42, series 5. Small pleural effusions noted on the right.  Upper abdomen: Limited view of the liver, kidneys, pancreas are unremarkable. Normal adrenal glands.  Musculoskeletal: 10 sclerotic metastasis involving the spine sternum and ribs. No change.  IMPRESSION: 1. Dense bilateral airspace disease with differential including multifocal pneumonia, severe pulmonary edema, or lymphangitic spread of carcinoma. Consolidative nodularity at the left lung base. 2. Small right effusion. 3. Stable sclerotic metastasis.   Electronically Signed   By: Suzy Bouchard M.D.   On: 08/25/2014 08:38    Assessment: 74 y.o. 74 y.o. [place] woman:   (1) Status post right breast biopsy in 05/2009 for a grade 2 invasive ductal carcinoma, T2 NX M1, Stage IV, with bone-only involvement, strongly estrogen and progesterone receptor-positive, HER-2-negative with an MIB-1 of 26%.  (2) Neoadjuvantly she received Letrozole and zoledronic acid beginning in June of 2011 and underwent right modified radical mastectomy February of 2012 for a ypT2 ypN2, grade 1 invasive ductal carcinoma with negative margins.   (3) She  completed radiation therapy in August of 2012.   (4) She has continued on Letrozole but was switched from Zoledronic acid to Denosumab Delton See) because of concerns regarding her serum creatinine. Delton See was being given every 8 weeks.  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  (6) symptomatic anemia, with creatinine clearance < 60 cc/min; Darbepoietin Q14d started 11/03/2011; received Feraheme 01/05/2012  (7) On subcutaneous B-12 supplementation monthly.  (8) Pain in left upper extremity with osseous metastasis, osteoarthritis, tendinopathy, and bursitis as confirmed by recent MRI.  (9) Anemia, multifactorial with renal disease, on Aranesp monthly, increased to every 2 weeks June 2016  (10) letrozole  was discontinued in September 2014 with evidence of disease progression. She was started on fulvestrant injections, first given on 10/17/2012; stopped 06/03/2014 with progression  (11) exemestane and everolimus started 07/08/2014  (a)everolimus help July 2016 due to concern re. pneumonitis   Plan:  Brittney Cunningham is clinically a bit better. She is not able to care for herself at home and her husband is "on his back" and will not be able to function as caregiver in the short term. "Everybody else is at work." -- This may complicate d/c plans and patient may need temporary SNF when she is ready for d/c.  Pulmonary consult pending.  No overt bleeding; Hb will be rechecked in AM; have added type and screen in case transfusion called for.  I will be out of town until Monday (08/31/2014). Please consult my partners as needed. Otherwise I will catch up w the patient when I get back.  Appreciate your help to Ms Petrakis!   Chauncey Cruel, MD 08/26/2014  7:51 PM Medical Oncology and Hematology [facility] [address] [place], [place] [address] Tel.  [phone]    Fax. [phone]

## 2014-08-26 NOTE — Progress Notes (Signed)
[facility] PROGRESS NOTE  Brittney Cunningham FXT:[id number] DOB: 1940-03-31 DOA: 08/24/2014 PCP: [name], MD  Brief Summary  74 y/o ? 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 There was bone only involvement. Tumor was strongly ER and PR positive, HER-2/neu negative, with an MIP-1 of 26%  She is under the care of Dr. [name] and has rec'd multiple regimens with progression.  She started exemestane and everolimus on 07/08/2014 and was admitted 7/12-7/15 with AKI + dyselectrolytemia + possible bronchitis which was treated with levaquin.  She had progressive SOB and hypoxia and was sent from her PCP's office to the ER on 7/26.   Assessment/Plan  Acute respiratory failure with hypoxia, due to possible health care associated pneumonia, pulmonary edema from acute diastolic heart failure, lymphangitic spread of stage IV breast Ca, or possible pneumonitis from recent chemotherapy - taper oxygen as tolerated -  Pulmonology to consult, but stated it would be late today - continue flutter valve and family to bring IS from home - continue vanc and ceftaz - f/u Respiratory Viral panel - increase diuresis:  Start lasix 57m IV BID  -  Daily weights and strict I/O -  Grade 1 DD with preserved EF on last ECHO from 06/2013  Stage IV breast cancer status post mastectomy and chemotherapy -patient is very clear that she would NOT WANT any further aggressive work-up of this if we think this is non- infectious and related to her breast cancer - Greatly appreciate input from oncology -thank you  Hypocalcemia, corrected calcium 7.8 -  Give 2gm IV calcium today  Hypokalemia - start daily potassium while diuresing  Diabetes mellitus type 2 with episode of hypoglycemia,  last A1c 7.9 on 06/10/2014 - low dose SSI  CAD with total occlusion of RCA 06/2013 -On maximal medical therapy including amlodipine 10, Coreg 25 twice a day, Imdur 60 twice a day  HTN with low normal BPs -d/c HCTZ 25 daily - d/c CCB  Chronic kidney disease stage III Estimated Creatinine Clearance: 36.8 mL/min (by C-G formula based on Cr of 1.48).  -stable currently  Anemia secondary to chronic disease, hgb trending down with platelets and WBC also down suggesting sample was diluted when it was drawn from the port -  Monitor for bleeding:  Occult stool -  Repeat CBC in AM  Diet:  Regular Access:  port IVF:  off Proph:  SCDs pending occult stool  Code Status: DNR Family Communication: patient alone Disposition Plan: pending pulm consult, further diuresis    Consultants:  Oncology, Dr. MJana Hakim Pulmonology  Procedures:  CT chest 7/26  Antibiotics:  vanc 7/25  ceftaz 7/25  HPI/Subjective:  Continues to be unable to cough up her secretions. Denies fevers, chills.    Objective: Filed Vitals:   08/25/14 1538 08/25/14 2111 08/26/14 0415 08/26/14 1515  BP: 137/57 128/59 109/70 125/43  Pulse: 85 74 65 90  Temp: 97.4 F (36.3 C) 97.5 F (36.4 C) 97.1 F (36.2 C) 98.4 F (36.9 C)  TempSrc: Oral Oral Oral Axillary  Resp: _0 Height:      Weight:      SpO2: 92% 98% 98% 92%    Intake/Output Summary (Last 24 hours) at 08/26/14 1726 Last data filed at 08/26/14 1059  Gross per 24 hour  Intake   1030 ml  Output   1400 ml  Net   -370 ml   Filed Weights   08/24/14 1343  Weight: 90.26 kg (198 lb 15.8 oz)   Body mass index is 35.79 kg/(m^2).  Exam:   General:  Adult female, No acute distress, rhonchorous cough  HEENT:  NCAT, MMM  Cardiovascular:  RRR, nl S1, S2 no mrg, 2+ pulses, warm extremities  Respiratory:  Rales and rhonchi, diminished, no increased WOB  Abdomen:   NABS, soft, NT/ND  MSK:   Normal tone and bulk, 2+ pitting edema  bilateral LEE  Neuro:  Grossly intact  Data Reviewed: Basic Metabolic Panel:  Recent Labs Lab 08/24/14 1042 08/24/14 1410 08/25/14 0525 08/26/14 0550  NA 140 140 142 138  K 3.0* 3.1* 3.7 3.6  CL 102 105 106 104  CO2 _0 GLUCOSE 100* 104* 92 147*  BUN 32* 31* 27* 26*  CREATININE 1.56* 1.51* 1.48* 1.46*  CALCIUM 6.8* 6.5* 6.6* 6.3*  MG  --  1.4*  --   --   PHOS  --  2.1*  --   --    Liver Function Tests:  Recent Labs Lab 08/24/14 1410 08/25/14 0525 08/26/14 0550  AST _1 ALT 13* 12* 12*  ALKPHOS 77 74 70  BILITOT 0.6 0.4 0.6  PROT 7.2 6.9 5.9*  ALBUMIN 2.5* 2.4* 2.1*   No results for input(s): LIPASE, AMYLASE in the last 168 hours. No results for input(s): AMMONIA in the last 168 hours. CBC:  Recent Labs Lab 08/24/14 1042 08/24/14 1410 08/25/14 0525 08/26/14 0550  WBC 6.9 6.3 6.3 4.7  NEUTROABS  --  4.2  --  2.7  HGB 9.8* 9.9* 9.2* 7.5*  HCT 29.9* 30.8* 28.8* 23.8*  MCV 86.4 85.1 87.3 87.5  PLT 169 161 182 129*    Recent Results (from the past 240 hour(s))  Culture, blood (routine x 2) Call MD if unable to obtain prior to antibiotics being given     Status: None (Preliminary result)   Collection Time: 08/24/14  2:00 PM  Result Value Ref Range Status   Specimen Description BLOOD LEFT HAND  Final   Special Requests IN PEDIATRIC BOTTLE 4CC  Final   Culture   Final    NO GROWTH 2 DAYS Performed at [facility]    Report Status PENDING  Incomplete  Culture, blood (routine x 2) Call MD if unable to obtain prior to antibiotics being given     Status: None (Preliminary result)   Collection Time: 08/24/14  2:10 PM  Result Value Ref Range Status   Specimen Description BLOOD LEFT ARM  Final   Special Requests BOTTLES DRAWN AEROBIC AND ANAEROBIC 10CC  Final   Culture   Final    NO GROWTH 2 DAYS Performed at [facility]    Report Status PENDING  Incomplete     Studies: Ct Chest Wo Contrast  08/25/2014   CLINICAL DATA:   Shortness of breath, pneumonia, mastectomy.  EXAM: CT CHEST WITHOUT CONTRAST  TECHNIQUE: Multidetector CT imaging of the chest was performed following the standard protocol without IV contrast.  COMPARISON:  Radiograph 08/24/2014, CT 07/15/2014  FINDINGS: Mediastinum/Nodes: There is no axillary or supraclavicular lymphadenopathy. Port in the right chest wall. No mediastinal hilar lymphadenopathy. No pericardial fluid. Esophagus normal. Coronary calcification and aortic calcification.  Lungs/Pleura: There is a diffuse dense airspace disease with air bronchograms involving the  upper lobes and lower lobes. There is some consolidated nodularity in the lower lobes. For example 2 cm nodule at the left lung base on image 42, series 5. Small pleural effusions noted on the right.  Upper abdomen: Limited view of the liver, kidneys, pancreas are unremarkable. Normal adrenal glands.  Musculoskeletal: 10 sclerotic metastasis involving the spine sternum and ribs. No change.  IMPRESSION: 1. Dense bilateral airspace disease with differential including multifocal pneumonia, severe pulmonary edema, or lymphangitic spread of carcinoma. Consolidative nodularity at the left lung base. 2. Small right effusion. 3. Stable sclerotic metastasis.   Electronically Signed   By: Suzy Bouchard M.D.   On: 08/25/2014 08:38    Scheduled Meds: . amLODipine  10 mg Oral Daily  . aspirin  81 mg Oral Daily  . atorvastatin  80 mg Oral Daily  . calcium carbonate  1 tablet Oral TID WC  . carvedilol  25 mg Oral BID WC  . cefTAZidime (FORTAZ)  IV  1 g Intravenous Q12H  . cholecalciferol  1,000 Units Oral Daily  . clopidogrel  75 mg Oral Daily  . exemestane  25 mg Oral QPC breakfast  . gabapentin  100 mg Oral TID  . insulin aspart  0-9 Units Subcutaneous TID WC  . isosorbide mononitrate  60 mg Oral BID  . levothyroxine  137 mcg Oral QAC breakfast  . morphine  30 mg Oral BID  . pantoprazole  40 mg Oral Daily  . potassium chloride  40 mEq  Oral Daily  . senna-docusate  1 tablet Oral QHS  . sodium chloride  10-40 mL Intracatheter Q12H  . vancomycin  1,250 mg Intravenous Q24H   Continuous Infusions:   Active Problems:   HCAP (healthcare-associated pneumonia)   Malnutrition of moderate degree    Time spent: 30 min    Jedidiah Demartini, [place] [facility] Pager [phone]. If 7PM-7AM, please contact night-coverage at [email or web address], password [facility] 08/26/2014, 5:26 PM  LOS: 2 days

## 2014-08-27 DIAGNOSIS — I5033 Acute on chronic diastolic (congestive) heart failure: Secondary | ICD-10-CM

## 2014-08-27 DIAGNOSIS — I5031 Acute diastolic (congestive) heart failure: Secondary | ICD-10-CM

## 2014-08-27 LAB — CBC WITH DIFFERENTIAL/PLATELET
BASOS ABS: 0 10*3/uL (ref 0.0–0.1)
Basophils Relative: 0 % (ref 0–1)
Eosinophils Absolute: 0.3 10*3/uL (ref 0.0–0.7)
Eosinophils Relative: 4 % (ref 0–5)
HCT: 27.6 % — ABNORMAL LOW (ref 36.0–46.0)
Hemoglobin: 8.9 g/dL — ABNORMAL LOW (ref 12.0–15.0)
LYMPHS ABS: 1.6 10*3/uL (ref 0.7–4.0)
LYMPHS PCT: 24 % (ref 12–46)
MCH: 28.2 pg (ref 26.0–34.0)
MCHC: 32.2 g/dL (ref 30.0–36.0)
MCV: 87.3 fL (ref 78.0–100.0)
Monocytes Absolute: 0.9 10*3/uL (ref 0.1–1.0)
Monocytes Relative: 13 % — ABNORMAL HIGH (ref 3–12)
NEUTROS ABS: 3.8 10*3/uL (ref 1.7–7.7)
NEUTROS PCT: 59 % (ref 43–77)
Platelets: 178 10*3/uL (ref 150–400)
RBC: 3.16 MIL/uL — AB (ref 3.87–5.11)
RDW: 16.8 % — ABNORMAL HIGH (ref 11.5–15.5)
WBC: 6.5 10*3/uL (ref 4.0–10.5)

## 2014-08-27 LAB — CRYPTOCOCCAL ANTIGEN: Crypto Ag: NEGATIVE

## 2014-08-27 LAB — GLUCOSE, CAPILLARY
GLUCOSE-CAPILLARY: 165 mg/dL — AB (ref 65–99)
Glucose-Capillary: 138 mg/dL — ABNORMAL HIGH (ref 65–99)
Glucose-Capillary: 185 mg/dL — ABNORMAL HIGH (ref 65–99)
Glucose-Capillary: 177 mg/dL — ABNORMAL HIGH (ref 65–99)

## 2014-08-27 LAB — TYPE AND SCREEN
ABO/RH(D): O NEG
ANTIBODY SCREEN: NEGATIVE

## 2014-08-27 LAB — BASIC METABOLIC PANEL
Anion gap: 12 (ref 5–15)
BUN: 25 mg/dL — AB (ref 6–20)
CHLORIDE: 100 mmol/L — AB (ref 101–111)
CO2: 27 mmol/L (ref 22–32)
Calcium: 7 mg/dL — ABNORMAL LOW (ref 8.9–10.3)
Creatinine, Ser: 1.38 mg/dL — ABNORMAL HIGH (ref 0.44–1.00)
GFR, EST AFRICAN AMERICAN: 43 mL/min — AB (ref >60)
GFR, EST NON AFRICAN AMERICAN: 37 mL/min — AB (ref >60)
Glucose, Bld: 118 mg/dL — ABNORMAL HIGH (ref 65–99)
Potassium: 3.8 mmol/L (ref 3.5–5.1)
SODIUM: 139 mmol/L (ref 135–145)

## 2014-08-27 LAB — PROCALCITONIN: Procalcitonin: 0.2 ng/mL

## 2014-08-27 LAB — SEDIMENTATION RATE: SED RATE: 124 mm/h — AB (ref 0–22)

## 2014-08-27 MED ORDER — FUROSEMIDE 10 MG/ML IJ SOLN
60.0000 mg | Freq: Three times a day (TID) | INTRAMUSCULAR | Status: DC
  Administered 2014-08-27 – 2014-08-28 (×3): 60 mg via INTRAVENOUS
  Filled 2014-08-27 (×3): qty 6

## 2014-08-27 MED ORDER — ENOXAPARIN SODIUM 40 MG/0.4ML ~~LOC~~ SOLN
40.0000 mg | SUBCUTANEOUS | Status: DC
  Administered 2014-08-27 – 2014-09-02 (×7): 40 mg via SUBCUTANEOUS
  Filled 2014-08-27 (×7): qty 0.4

## 2014-08-27 NOTE — Progress Notes (Signed)
ANTICOAGULATION CONSULT NOTE - Initial Consult  Pharmacy Consult for enoxaparin Indication: VTE prophylaxis  Allergies  Allergen Reactions  . Chlorhexidine Hives, Itching and Rash    This was most likely a CONTACT DERMATITIS versus true systemic allergic reaction  . Adhesive [Tape] Hives  . Codeine Swelling  . Latex Rash    Patient Measurements: Height: 5' 2.5" (158.8 cm) Weight: 198 lb 15.8 oz (90.26 kg) IBW/kg (Calculated) : 51.25 Heparin Dosing Weight:   Vital Signs: Temp: 98.1 F (36.7 C) (07/28 1327) Temp Source: Oral (07/28 1327) BP: 131/44 mmHg (07/28 1327) Pulse Rate: 83 (07/28 1327)  Labs:  Recent Labs  08/24/14 1410 08/25/14 0525 08/26/14 0550 08/27/14 0420  HGB 9.9* 9.2* 7.5* 8.9*  HCT 30.8* 28.8* 23.8* 27.6*  PLT 161 182 129* 178  APTT 43*  --   --   --   LABPROT 15.9*  --  15.8*  --   INR 1.26  --  1.24  --   CREATININE 1.51* 1.48* 1.46* 1.38*    Estimated Creatinine Clearance: 39.5 mL/min (by C-G formula based on Cr of 1.38).   Medical History: Past Medical History  Diagnosis Date  . 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 Depression   . GERD (gastroesophageal reflux disease)   . Hyperlipidemia   . Hypothyroidism   . Osteoarthritis   . Carotid artery stenosis     bilateral  . CAD (coronary artery disease)     medical therapy, old tot RCA, patent LAD, mod. severe ostial LCX stenosis--PCI would be too complex-med mgmt recommended.  . OSA (obstructive sleep apnea)   . Hypertension   . Anxiety   . Carotid artery occlusion   . Unspecified constipation 03/15/2012  . Bursitis   . Collagen vascular disease   . History of radiation therapy 10/31/12-11/13/12    rt&lt humerous 30Gy/52fx  . Valvular heart disease 07/11/2013  . Diarrhea 09/28/2013  . Chronic renal insufficiency, stage II (mild)     Borderline stage II/III, CrCl 50s/60s.  . History of blood transfusion   . Urinary tract bacterial infections   . Breast  cancer 05/2009    Grade 2 invasive ductal carcinoma, T2 NXM1, stage IV at presentation. There was bone only involvement.   . Diabetes mellitus type II     insulin  . Hypocalcemia 07/2014    required IV calcium in ED once and was admitted to hosp once.  ? secondary to [facility] and everolimus?    Assessment: 20 YOF with stage IV breast cancer presents with shortness of breath.  Pharmacy asked to dose enoxaparin for VTE prophylaxis in this patient.  Renal: SCr improved from admission, trending down CBC: improved 7/28  Goal of Therapy:  Dose for indication, weight, and renal function   Plan:   Enoxaparin 40mg  SQ q24h for prophylaxis  Pharmacy to intervene if dose adjustment needed for change in renal function  Doreene Eland, PharmD, BCPS.   Pager: [phone] 08/27/2014,1:31 PM

## 2014-08-27 NOTE — Progress Notes (Signed)
[facility] PROGRESS NOTE  Brittney Cunningham KXF:[id number] DOB: 09-18-1940 DOA: 08/24/2014 PCP: [name], MD  Brief Summary  74 y/o ? 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 There was bone only involvement. Tumor was strongly ER and PR positive, HER-2/neu negative, with an MIP-1 of 26%  She is under the care of Dr. [name] and has rec'd multiple regimens with progression.  She started exemestane and everolimus on 07/08/2014 and was admitted 7/12-7/15 with AKI + dyselectrolytemia + possible bronchitis which was treated with levaquin.  She had progressive SOB and hypoxia and was sent from her PCP's office to the ER on 7/26.   Assessment/Plan  Acute respiratory failure with hypoxia, due to possible health care associated pneumonia, pulmonary edema from acute diastolic heart failure, lymphangitic spread of stage IV breast Ca, or possible pneumonitis from everolimus.  She has remained afebrile without leukocytosis and her procalcitonin is negative.  - taper oxygen as tolerated - appreciate Pulmonology assistance - continue flutter valve - d/c vanc and ceftaz - f/u Respiratory Viral panel - increase diuresis:  Lasix $Remo'60mg'KMJEW$  IV TID -   -752mL yesterday -  Start daily weights -  Grade 1 DD with preserved EF on last ECHO from 06/2013  Stage IV breast cancer status post mastectomy and chemotherapy -patient is very clear that she would NOT WANT any further aggressive work-up of this if we think this is non- infectious and related to her breast cancer - appreciate oncology assistance  Hypocalcemia, improved with IV calcium  Hypokalemia - start daily potassium while diuresing  Diabetes mellitus type 2 with episode of hypoglycemia, last A1c 7.9 on 06/10/2014 -  low dose SSI  CAD with total occlusion of RCA 06/2013 -On maximal medical therapy including Coreg 25 twice a day, Imdur 60 twice a day  HTN with low normal BPs -d/c HCTZ 25 daily - holding CCB   Chronic kidney disease stage III Estimated Creatinine Clearance: 36.8 mL/min (by C-G formula based on Cr of 1.48).  -stable currently  Anemia secondary to chronic disease, hgb 8.$RemoveBefore'9mg'ZBKdhbhvwvxoI$ /dl -  Monitor for bleeding:  Occult stool -  Repeat CBC in AM  Diet:  Regular Access:  port IVF:  off Proph:  SCDs pending occult stool  Code Status: DNR Family Communication: patient alone Disposition Plan: pending further diuresis    Consultants:  Oncology, Dr. [name]  Pulmonology  Procedures:  CT chest 7/26  Antibiotics:  vanc 7/25 > 7/28  ceftaz 7/25 > 7/28  HPI/Subjective:   Feeling much better.  Voided some but not as much as she expected yesterday.  Able to walk to door before SOB today.  Denies fevers, chills.     Objective: Filed Vitals:   08/26/14 0415 08/26/14 1515 08/26/14 2118 08/27/14 0533  BP: 109/70 125/43 139/58 136/49  Pulse: 65 90 96 80  Temp: 97.1 F (36.2 C) 98.4 F (36.9 C) 98.3 F (36.8 C) 98.1 F (36.7 C)  TempSrc: Oral Axillary Oral Oral  Resp: $Remo'18 20 18 16  'jjJmW$ Height:      Weight:      SpO2: 98% 92% 93% 92%    Intake/Output Summary (Last 24 hours) at 08/27/14 1253 Last data filed at 08/27/14 0914  Gross per 24 hour  Intake    420 ml  Output   1000 ml  Net   -580 ml   Filed Weights   08/24/14 1343  Weight: 90.26 kg (198 lb 15.8 oz)   Body mass index is 35.79 kg/(m^2).  Exam:   General:  Adult female, No acute distress, rhonchorous cough  HEENT:  NCAT, MMM  Cardiovascular:  RRR, nl S1, S2 no mrg, 2+ pulses, warm extremities  Respiratory:  Rales and rhonchi, diminished, no increased WOB  Abdomen:   NABS, soft, NT/ND  MSK:   Normal tone and bulk, 2+ pitting edema bilateral LEE  Neuro:  Grossly intact  Data Reviewed: Basic Metabolic  Panel:  Recent Labs Lab 08/24/14 1042 08/24/14 1410 08/25/14 0525 08/26/14 0550 08/27/14 0420  NA 140 140 142 138 139  K 3.0* 3.1* 3.7 3.6 3.8  CL 102 105 106 104 100*  CO2 $Re'23 24 24 22 27  'pAw$ GLUCOSE 100* 104* 92 147* 118*  BUN 32* 31* 27* 26* 25*  CREATININE 1.56* 1.51* 1.48* 1.46* 1.38*  CALCIUM 6.8* 6.5* 6.6* 6.3* 7.0*  MG  --  1.4*  --   --   --   PHOS  --  2.1*  --   --   --    Liver Function Tests:  Recent Labs Lab 08/24/14 1410 08/25/14 0525 08/26/14 0550  AST $Re'28 28 29  'fzL$ ALT 13* 12* 12*  ALKPHOS 77 74 70  BILITOT 0.6 0.4 0.6  PROT 7.2 6.9 5.9*  ALBUMIN 2.5* 2.4* 2.1*   No results for input(s): LIPASE, AMYLASE in the last 168 hours. No results for input(s): AMMONIA in the last 168 hours. CBC:  Recent Labs Lab 08/24/14 1042 08/24/14 1410 08/25/14 0525 08/26/14 0550 08/27/14 0420  WBC 6.9 6.3 6.3 4.7 6.5  NEUTROABS  --  4.2  --  2.7 3.8  HGB 9.8* 9.9* 9.2* 7.5* 8.9*  HCT 29.9* 30.8* 28.8* 23.8* 27.6*  MCV 86.4 85.1 87.3 87.5 87.3  PLT 169 161 182 129* 178    Recent Results (from the past 240 hour(s))  Culture, blood (routine x 2) Call MD if unable to obtain prior to antibiotics being given     Status: None (Preliminary result)   Collection Time: 08/24/14  2:00 PM  Result Value Ref Range Status   Specimen Description BLOOD LEFT HAND  Final   Special Requests IN PEDIATRIC BOTTLE 4CC  Final   Culture   Final    NO GROWTH 3 DAYS Performed at [facility]    Report Status PENDING  Incomplete  Culture, blood (routine x 2) Call MD if unable to obtain prior to antibiotics being given     Status: None (Preliminary result)   Collection Time: 08/24/14  2:10 PM  Result Value Ref Range Status   Specimen Description BLOOD LEFT ARM  Final   Special Requests BOTTLES DRAWN AEROBIC AND ANAEROBIC 10CC  Final   Culture   Final    NO GROWTH 3 DAYS Performed at [facility]    Report Status PENDING  Incomplete     Studies: No results  found.  Scheduled Meds: . amLODipine  10 mg Oral Daily  . aspirin  81 mg Oral Daily  . atorvastatin  80 mg Oral Daily  . calcium carbonate  1 tablet Oral TID WC  . carvedilol  25 mg Oral BID WC  . cefTAZidime (FORTAZ)  IV  1 g Intravenous Q12H  . cholecalciferol  1,000 Units Oral Daily  . clopidogrel  75 mg Oral Daily  .  exemestane  25 mg Oral QPC breakfast  . furosemide  40 mg Intravenous BID  . gabapentin  100 mg Oral TID  . insulin aspart  0-9 Units Subcutaneous TID WC  . isosorbide mononitrate  60 mg Oral BID  . levothyroxine  137 mcg Oral QAC breakfast  . morphine  30 mg Oral BID  . pantoprazole  40 mg Oral Daily  . potassium chloride  40 mEq Oral Daily  . senna-docusate  1 tablet Oral QHS  . sodium chloride  10-40 mL Intracatheter Q12H   Continuous Infusions:   Active Problems:   HCAP (healthcare-associated pneumonia)   Malnutrition of moderate degree    Time spent: 30 min    Chinmay Squier, [place] [facility] Pager [phone]. If 7PM-7AM, please contact night-coverage at [email or web address], password [facility] 08/27/2014, 12:53 PM  LOS: 3 days

## 2014-08-27 NOTE — Progress Notes (Signed)
ANTIBIOTIC CONSULT NOTE   Pharmacy Consult for vancomycin Indication: pneumonia  Allergies  Allergen Reactions  . Chlorhexidine Hives, Itching and Rash    This was most likely a CONTACT DERMATITIS versus true systemic allergic reaction  . Adhesive [Tape] Hives  . Codeine Swelling  . Latex Rash    Patient Measurements: Height: 5' 2.5" (158.8 cm) Weight: 198 lb 15.8 oz (90.26 kg) IBW/kg (Calculated) : 51.25  Body Weight: 90.3kg  Vital Signs: Temp: 98.1 F (36.7 C) (07/28 0533) Temp Source: Oral (07/28 0533) BP: 136/49 mmHg (07/28 0533) Pulse Rate: 80 (07/28 0533) Intake/Output from previous day: 07/27 0701 - 07/28 0700 In: 660 [P.O.:600; I.V.:10; IV Piggyback:50] Out: 1400 [Urine:1400] Intake/Output from this shift:    Labs:  Recent Labs  08/25/14 0525 08/26/14 0550 08/27/14 0420  WBC 6.3 4.7 6.5  HGB 9.2* 7.5* 8.9*  PLT 182 129* 178  CREATININE 1.48* 1.46* 1.38*   Estimated Creatinine Clearance: 39.5 mL/min (by C-G formula based on Cr of 1.38). No results for input(s): VANCOTROUGH, VANCOPEAK, VANCORANDOM, GENTTROUGH, GENTPEAK, GENTRANDOM, TOBRATROUGH, TOBRAPEAK, TOBRARND, AMIKACINPEAK, AMIKACINTROU, AMIKACIN in the last 72 hours.   Microbiology: Recent Results (from the past 720 hour(s))  Culture, blood (routine x 2) Call MD if unable to obtain prior to antibiotics being given     Status: None (Preliminary result)   Collection Time: 08/24/14  2:00 PM  Result Value Ref Range Status   Specimen Description BLOOD LEFT HAND  Final   Special Requests IN PEDIATRIC BOTTLE 4CC  Final   Culture   Final    NO GROWTH 2 DAYS Performed at [facility]    Report Status PENDING  Incomplete  Culture, blood (routine x 2) Call MD if unable to obtain prior to antibiotics being given     Status: None (Preliminary result)   Collection Time: 08/24/14  2:10 PM  Result Value Ref Range Status   Specimen Description BLOOD LEFT ARM  Final   Special Requests BOTTLES DRAWN  AEROBIC AND ANAEROBIC 10CC  Final   Culture   Final    NO GROWTH 2 DAYS Performed at [facility]    Report Status PENDING  Incomplete    Medical History: Past Medical History  Diagnosis Date  . 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 Depression   . GERD (gastroesophageal reflux disease)   . Hyperlipidemia   . Hypothyroidism   . Osteoarthritis   . Carotid artery stenosis     bilateral  . CAD (coronary artery disease)     medical therapy, old tot RCA, patent LAD, mod. severe ostial LCX stenosis--PCI would be too complex-med mgmt recommended.  . OSA (obstructive sleep apnea)   . Hypertension   . Anxiety   . Carotid artery occlusion   . Unspecified constipation 03/15/2012  . Bursitis   . Collagen vascular disease   . History of radiation therapy 10/31/12-11/13/12    rt&lt humerous 30Gy/9fx  . Valvular heart disease 07/11/2013  . Diarrhea 09/28/2013  . Chronic renal insufficiency, stage II (mild)     Borderline stage II/III, CrCl 50s/60s.  . History of blood transfusion   . Urinary tract bacterial infections   . Breast cancer 05/2009    Grade 2 invasive ductal carcinoma, T2 NXM1, stage IV at presentation. There was bone only involvement.   . Diabetes mellitus type II     insulin  . Hypocalcemia 07/2014    required IV calcium in ED  once and was admitted to hosp once.  ? secondary to [facility] and everolimus?    Assessment: 74 y.o.Patient presents to the emergency room with hypoxia and shortness of breath. Patient states she was recently admitted to the hospital on July 12 because of shortness of breath.  Pharmacy consulted to dose vancomycin for pneumonia.  7/25 << ceftazidime << 7/25 << vancomycin <<  7/25 blood: NGTD / sputum: not collected 7/26 resp virus panel: pending Strep Ag: neg Legionella Ag: neg Flu Panel: neg  Afebrile CKD stable WBC WNL  Goal of Therapy:  Vancomycin trough level 15-20 mcg/ml vanc per renal  function  Plan:  Day #4 vancomycin/ceftazadime  Continue Vancomycin 1250mg  IV q24h  Check trough in am if vancomycin to continue   Continue ceftazidime 1gm IV q12h  Doreene Eland, PharmD, BCPS.   Pager: [phone] 08/27/2014, 10:19 AM

## 2014-08-27 NOTE — Clinical Social Work Note (Signed)
Clinical Social Work Assessment  Patient Details  Name: Brittney Cunningham MRN: [id number] Date of Birth: Mar 22, 1940  Date of referral:  08/27/14               Reason for consult:  Discharge Planning                Permission sought to share information with:  Family Supports Permission granted to share information::  Yes, Verbal Permission Granted  Name::     Genoa Freyre  Agency::     Relationship::  Husband  Contact Information:  [phone]  Housing/Transportation Living arrangements for the past 2 months:  [place] of Information:  Patient, Spouse Patient Interpreter Needed:  None Criminal Activity/Legal Involvement Pertinent to Current Situation/Hospitalization:  No - Comment as needed Significant Relationships:  Spouse, Adult Children Lives with:  Spouse Do you feel safe going back to the place where you live?  No (depending on progress while inpatient in hospital then pt may be comfortable returning home) Need for family participation in patient care:     Care giving concerns:  Pt admitted from home with spouse. Pt weaker at this time and PT recommending short term rehab.   Social Worker assessment / plan:  CSW received referral for New SNF.   CSW met with pt, pt spouse, and pt niece at bedside. CSW introduced self and explained role. Pt reports that she admitted from home where she lives with pt husband. Pt states that prior to admission she was independent with most all of her ADL's and pt husband provided some assistance. Pt acknowledged that she has been substantially weaker recently. CSW discussed PT recommendation for rehab at SNF prior to returning home. Pt stated that if rehab at SNF is recommended then she is open to exploring the option. Pt stated that she would alike to await final decision about disposition plan until ready for discharge, but agreeable to CSW initiating SNF search to [place] to have options available. Pt  spouse agreeable to plan to initiate search and have options in order for pt and pt spouse to make a decision about disposition plan.   CSW completed FL2 and initiated SNF search to [facility]. Pt expressed interest in [facility] as it is close to pt home and if pt goes to rehab she would like to stay as close to home as possible. CSW provided support as pt discussed that it is important for her to remain close to home where she can see her family during short term rehab if she goes that route because she has terminal cancer and is concentrating on spending quality time with her family. CSW contacted [facility] regarding pt interest and requested facility to review information.   CSW to follow up with pt and pt spouse regarding SNF bed offers.  CSW to continue to follow to provide support and assist with pt disposition needs.   Employment status:  Retired Nurse, adult PT Recommendations:  [place], [place] / Referral to community resources:  [place]  Patient/Family's Response to care:  Pt alert and oriented x 4. Pt expressed that she would be open to SNF upon discharge if it remains recommendation and if she still feels to weak to return home when medically ready for discharge. Pt spouse supportive, but has back problems and can provide very minimal assist to pt.  Patient/Family's Understanding of and Emotional Response to Diagnosis, Current  Treatment, and Prognosis:  Pt displayed knowledge for diagnosis and prognosis. Pt states that she has expressed not wanting any more treatment for her cancer and is concentrating on spending time with her family, but her goal is to be able to continue to manage at home and feels short term rehab may assist with that.   Emotional Assessment Appearance:  Appears stated age Attitude/Demeanor/Rapport:  Other (appropriate) Affect (typically observed):   Accepting, Adaptable Orientation:  Oriented to Self, Oriented to Place, Oriented to  Time, Oriented to Situation Alcohol / Substance use:  Not Applicable Psych involvement (Current and /or in the community):  No (Comment)  Discharge Needs  Concerns to be addressed:  Discharge Planning Concerns Readmission within the last 30 days:  Yes Current discharge risk:  Terminally ill Barriers to Discharge:  Continued Medical Work up   Alison Murray A, LCSW 08/27/2014, 2:29 PM  [phone]

## 2014-08-27 NOTE — Progress Notes (Signed)
Physical Therapy Treatment Patient Details Name: TY OSHIMA MRN: [id number] DOB: December 29, 1940 Today's Date: 08/27/2014    History of Present Illness 74 year old female with past medical history of breast cancer with bone metastasis, anemia of chronic disease, diabetes, hypertension who was recently hospitalized for severe electrolyte abnormalities and was on empiric abx for pneumonia admitted with Acute respiratory failure with hypoxia / healthcare associated pneumonia    PT Comments    Pt only able to tolerate same distance as previous visit due to dyspnea.  Pt required supplemental oxygen for ambulation as well.  SATURATION QUALIFICATIONS: (This note is used to comply with regulatory documentation for home oxygen)  Patient Saturations on Room Air at Rest = 92%  Patient Saturations on Room Air while Ambulating = 84%  Patient Saturations on 2 Liters of oxygen while Ambulating = 95%  Please briefly explain why patient needs home oxygen: to maintain oxygen saturations above 88% while performing functional tasks such as ambulation.   Follow Up Recommendations  SNF;Supervision/Assistance - 24 hour     Equipment Recommendations  None recommended by PT    Recommendations for Other Services       Precautions / Restrictions Precautions Precautions: Fall Precaution Comments: monitor sats Restrictions Weight Bearing Restrictions: No    Mobility  Bed Mobility               General bed mobility comments: pt in recliner on arrival  Transfers Overall transfer level: Needs assistance Equipment used: None Transfers: Sit to/from Stand Sit to Stand: Min guard         General transfer comment: close guard for safety  Ambulation/Gait Ambulation/Gait assistance: Min guard Ambulation Distance (Feet): 20 Feet Assistive device: None (pushed IV pole) Gait Pattern/deviations: Step-through pattern;Decreased stride length     General Gait Details: slow gait speed, used IV  pole for support (typically uses cane), SpO2 dropped to 84% room air so applied 2L O2, distance limited by fatigue and dyspnea   Stairs            Wheelchair Mobility    Modified Rankin (Stroke Patients Only)       Balance                                    Cognition Arousal/Alertness: Awake/alert Behavior During Therapy: WFL for tasks assessed/performed Overall Cognitive Status: Within Functional Limits for tasks assessed                      Exercises      General Comments        Pertinent Vitals/Pain Pain Assessment: No/denies pain    Home Living                      Prior Function            PT Goals (current goals can now be found in the care plan section) Progress towards PT goals: Progressing toward goals    Frequency  Min 3X/week    PT Plan Current plan remains appropriate    Co-evaluation             End of Session Equipment Utilized During Treatment: Gait belt Activity Tolerance: Patient limited by fatigue Patient left: in chair;with call bell/phone within reach     Time: 0847-0900 PT Time Calculation (min) (ACUTE ONLY): 13 min  Charges:  $Gait Training: 8-22  mins                    G Codes:      Caelan Branden,KATHrine E 2014-09-08, 9:25 AM Carmelia Bake, PT, DPT 09/08/14 Pager: [phone]

## 2014-08-27 NOTE — Clinical Social Work Placement (Signed)
   CLINICAL SOCIAL WORK PLACEMENT  NOTE  Date:  08/27/2014  Patient Details  Name: Brittney Cunningham MRN: [id number] Date of Birth: 08/15/1940  Clinical Social Work is seeking post-discharge placement for this patient at the [place] level of care (*CSW will initial, date and re-position this form in  chart as items are completed):  Yes   Patient/family provided with [place] Work Department's list of facilities offering this level of care within the geographic area requested by the patient (or if unable, by the patient's family).  Yes   Patient/family informed of their freedom to choose among providers that offer the needed level of care, that participate in Medicare, Medicaid or managed care program needed by the patient, have an available bed and are willing to accept the patient.  Yes   Patient/family informed of [place]'s ownership interest in [facility] and [facility], as well as of the fact that they are under no obligation to receive care at these facilities.  PASRR submitted to EDS on 08/27/14     PASRR number received on 08/27/14     Existing PASRR number confirmed on       FL2 transmitted to all facilities in geographic area requested by pt/family on 08/27/14     FL2 transmitted to all facilities within larger geographic area on       Patient informed that his/her managed care company has contracts with or will negotiate with certain facilities, including the following:            Patient/family informed of bed offers received.  Patient chooses bed at       Physician recommends and patient chooses bed at      Patient to be transferred to   on  .  Patient to be transferred to facility by       Patient family notified on   of transfer.  Name of family member notified:        PHYSICIAN Please sign FL2, Please sign DNR     Additional Comment:    _______________________________________________ Ladell Pier,  LCSW 08/27/2014, 2:38 PM

## 2014-08-27 NOTE — Consult Note (Signed)
Name: Brittney Cunningham MRN: [id number] DOB: Nov 21, 1940    ADMISSION DATE:  08/24/2014 CONSULTATION DATE:  08/27/2014  REFERRING MD :  Dr. Sheran Fava  CHIEF COMPLAINT:  Dyspnea  BRIEF PATIENT DESCRIPTION:  74 yo female with remote hx of smoking admitted with cough, dyspnea, hypoxia, and pulmonary infiltrates initially thought to be HCAP.  She has hx of Stage IV breast cancer dx in 2011 with diffuse bone mets s/p Rt modified radical mastectomy, XRT, letrozole >> changed to fulvestrant until 06/03/14, zoledronic acid >> changed to denosumab on 07/24/14, and exemestane and everolimus started 07/08/14.  SIGNIFICANT EVENTS  7/11 to 7/13  Admit for hypocalcemia, pneumonia 7/25  Readmitted, oncology consulted, DNR status established.  STUDIES:  6/07 Echo >> EF 55 to 62%, grade 1 diastolic dysfx 3/76 CT chest >> 5 mm nodule Rt lung base, diffuse sclerotic lesions 7/26 CT chest >> diffuse dense ASD with air bronchograms b/l   HISTORY OF PRESENT ILLNESS:   74 yo female was in hospital from 7/11 to 7/13 with hypocalcemia with elevated serum calcitonin, AKI, anemia, and PNA >> tx with levaquin.  After d/c home she had progressive cough, dyspnea, nausea and decreased appetite.  She was seen in PCP office and re-admitted with persistent PNA with hypoxia.  She has hx of Stage IV breast cancer with bone mets.  She was dx in 2011.  She had neoadjuvant tx with letrozole and zoledronic acid.  She had Rt modified radical mastectomy in 2012.  She completed XRT in 2012.  She was changed from letrozole to fulvestrant due to disease progression >> d/c'ed 06/03/14.  She had zoledronic acid changed to denosumab on 07/24/14 due to rising creatinine.  She was started on broad spectrum antibiotics.  She was seen by her oncologist.  She had CT chest.  Concern was for possible atypical infection versus pneumonitis from reaction to chemotherapy.  PCCM was consulted to further assess.  Currently c/o BLE edema and some mild SOB  but much improved overall.  Denies purulent sputum, fever, hemoptysis, chest pain.     She  has a past medical history of Anemia; Depression; GERD (gastroesophageal reflux disease); Hyperlipidemia; Hypothyroidism; Osteoarthritis; Carotid artery stenosis; CAD (coronary artery disease); OSA (obstructive sleep apnea); Hypertension; Anxiety; Carotid artery occlusion; Unspecified constipation (03/15/2012); Bursitis; Collagen vascular disease; History of radiation therapy (10/31/12-11/13/12); Valvular heart disease (07/11/2013); Diarrhea (09/28/2013); Chronic renal insufficiency, stage II (mild); History of blood transfusion; Urinary tract bacterial infections; Breast cancer (05/2009); Diabetes mellitus type II; and Hypocalcemia (07/2014).   She  has past surgical history that includes Incisional breast biopsy; Cesarean section; Other surgical history; Knee surgery; Carotid endarterectomy (2011); Port-a-cath removal; Modified radical mastectomy (Feb 2012); Carotid stent; Cardiac catheterization (06/2013); carotid angiogram (N/A, 03/01/2011); arch aortogram (03/01/2011); carotid stent insertion (Left, 03/15/2011); left heart catheterization with coronary angiogram (N/A, 07/07/2013); and carotid dopplers (05/2014).   Prior to Admission medications   Medication Sig Start Date End Date Taking? Authorizing Provider  ACCU-CHEK AVIVA PLUS test strip CHECK BLOOD SUGAR TWICE A DAY FOR DX:250.00 08/10/14  Yes [name], MD  amLODipine (NORVASC) 10 MG tablet TAKE ONE TABLET BY MOUTH EVERY DAY Patient taking differently: TAKE 10 MG BY MOUTH DAILY. 06/15/14  Yes [name], MD  aspirin 81 MG chewable tablet Chew 81 mg by mouth daily.   Yes Historical Provider, MD  atorvastatin (LIPITOR) 80 MG tablet Take 1 tablet (80 mg total) by mouth daily. 07/20/14  Yes Peter M Martinique, MD  Blood Glucose  Monitoring Suppl (ACCU-CHEK NANO SMARTVIEW) W/DEVICE KIT Use to check blood sugar twice a day. DX 250.00 03/28/13  Yes Mosie Lukes,  MD  calcium carbonate (TUMS) 500 MG chewable tablet Chew 1 tablet (200 mg of elemental calcium total) by mouth 3 (three) times daily with meals. 08/02/14  Yes [name], MD  carvedilol (COREG) 25 MG tablet Take 1 tablet (25 mg total) by mouth 2 (two) times daily with a meal. 02/19/14  Yes Peter M Martinique, MD  clopidogrel (PLAVIX) 75 MG tablet Take 1 tablet (75 mg total) by mouth daily. 11/28/13  Yes Serafina Mitchell, MD  cyanocobalamin (,VITAMIN B-12,) 1000 MCG/ML injection Inject 1,000 mcg into the muscle every 30 (thirty) days.    Yes Historical Provider, MD  darbepoetin (ARANESP) 200 MCG/0.4ML SOLN Inject 200 mcg into the skin every 14 (fourteen) days.    Yes Historical Provider, MD  everolimus (AFINITOR) 5 MG tablet Take 1 tablet (5 mg total) by mouth daily. 06/30/14  Yes Chauncey Cruel, MD  exemestane (AROMASIN) 25 MG tablet Take 1 tablet (25 mg total) by mouth daily after breakfast. 06/12/14  Yes Laurie Panda, NP  gabapentin (NEURONTIN) 100 MG capsule Take 1 capsule (100 mg total) by mouth 3 (three) times daily. 04/06/14  Yes Laurie Panda, NP  glycerin adult (GLYCERIN ADULT) 2 G SUPP Place 1 suppository rectally once as needed (constipation). 04/13/12  Yes Julianne Rice, MD  hydrochlorothiazide (MICROZIDE) 12.5 MG capsule Take 2 capsules (25 mg total) by mouth daily. 04/08/14  Yes Isaiah Serge, NP  ibuprofen (ADVIL,MOTRIN) 200 MG tablet Take 400 mg by mouth every 6 (six) hours as needed for headache, mild pain or moderate pain.   Yes Historical Provider, MD  Insulin Isophane & Regular Human (HUMULIN 70/30 MIX) (70-30) 100 UNIT/ML PEN 20 U sub Q qAM and 10 U subQ qPM Patient taking differently: Inject 10-20 Units into the skin 2 (two) times daily. 20 U sub Q qAM and 10 U subQ qPM 07/13/14  Yes [name], MD  isosorbide mononitrate (IMDUR) 60 MG 24 hr tablet Take 1 tablet (60 mg total) by mouth 2 (two) times daily. 08/13/14  Yes Peter M Martinique, MD  levothyroxine (SYNTHROID,  LEVOTHROID) 137 MCG tablet Take 1 tablet (137 mcg total) by mouth daily before breakfast. 06/10/14  Yes [name], MD  morphine (MS CONTIN) 30 MG 12 hr tablet Take 1 tablet (30 mg total) by mouth 2 (two) times daily. 08/13/14  Yes Chauncey Cruel, MD  nitroGLYCERIN (NITROSTAT) 0.4 MG SL tablet Place 1 tablet (0.4 mg total) under the tongue every 5 (five) minutes as needed for chest pain. 07/08/13  Yes Janece Canterbury, MD  omeprazole (PRILOSEC) 40 MG capsule Take 1 capsule (40 mg total) by mouth daily. 03/02/14  Yes Mosie Lukes, MD  polyethylene glycol (MIRALAX / GLYCOLAX) packet Take 17 g by mouth daily as needed for mild constipation.    Yes Historical Provider, MD  prochlorperazine (COMPAZINE) 10 MG tablet Take 1 tablet (10 mg total) by mouth every 6 (six) hours as needed for nausea or vomiting. 08/07/14  Yes Susanne Borders, NP  senna-docusate (SENOKOT-S) 8.6-50 MG per tablet Take 1 tablet by mouth at bedtime.    Yes Historical Provider, MD  levofloxacin (LEVAQUIN) 250 MG tablet Take 1 tablet (250 mg total) by mouth daily. Patient not taking: Reported on 08/24/2014 08/12/14   Theodis Blaze, MD   Allergies  Allergen Reactions  . Chlorhexidine  Hives, Itching and Rash    This was most likely a CONTACT DERMATITIS versus true systemic allergic reaction  . Adhesive [Tape] Hives  . Codeine Swelling  . Latex Rash    FAMILY HISTORY:  Her family history includes Arthritis in an other family member; Breast cancer (age of onset: 42) in her paternal aunt; Cancer in her father and mother; Coronary artery disease in an other family member; Deep vein thrombosis in her mother; Diabetes in her mother; Heart disease in her mother; Hypertension in her mother; Stroke in an other family member. There is no history of Colon cancer or Stomach cancer.  SOCIAL HISTORY: She  reports that she quit smoking about 26 years ago. Her smoking use included Cigarettes. She has a 20 pack-year smoking history. She has never  used smokeless tobacco. She reports that she does not drink alcohol or use illicit drugs.   REVIEW OF SYSTEMS:   As per HPI - All other systems reviewed and were neg.    SUBJECTIVE:   VITAL SIGNS: Temp:  [98.1 F (36.7 C)-98.4 F (36.9 C)] 98.1 F (36.7 C) (07/28 0533) Pulse Rate:  [80-96] 80 (07/28 0533) Resp:  [16-20] 16 (07/28 0533) BP: (125-139)/(43-58) 136/49 mmHg (07/28 0533) SpO2:  [92 %-93 %] 92 % (07/28 0533)  PHYSICAL EXAMINATION: General:  Very pleasant, chronically ill appearing female, NAD sitting OOB in chair  Neuro:  Awake, alert,appropriate, generalized weakness  HEENT:  Mm moist, no JVD  Cardiovascular:  s1s2 rrr Lungs:  resps even, non labored on [place], few scattered crackles, no audible wheeze  Abdomen:  Round, soft, non tender  Musculoskeletal:  Warm and dry, 2+ BLE edema    CMP Latest Ref Rng 08/27/2014 08/26/2014 08/25/2014  Glucose 65 - 99 mg/dL 118(H) 147(H) 92  BUN 6 - 20 mg/dL 25(H) 26(H) 27(H)  Creatinine 0.44 - 1.00 mg/dL 1.38(H) 1.46(H) 1.48(H)  Sodium 135 - 145 mmol/L 139 138 142  Potassium 3.5 - 5.1 mmol/L 3.8 3.6 3.7  Chloride 101 - 111 mmol/L 100(L) 104 106  CO2 22 - 32 mmol/L $RemoveB'27 22 24  '[id number]$ Calcium 8.9 - 10.3 mg/dL 7.0(L) 6.3(LL) 6.6(L)  Total Protein 6.5 - 8.1 g/dL - 5.9(L) 6.9  Total Bilirubin 0.3 - 1.2 mg/dL - 0.6 0.4  Alkaline Phos 38 - 126 U/L - 70 74  AST 15 - 41 U/L - 29 28  ALT 14 - 54 U/L - 12(L) 12(L)    CBC Latest Ref Rng 08/27/2014 08/26/2014 08/25/2014  WBC 4.0 - 10.5 K/uL 6.5 4.7 6.3  Hemoglobin 12.0 - 15.0 g/dL 8.9(L) 7.5(L) 9.2(L)  Hematocrit 36.0 - 46.0 % 27.6(L) 23.8(L) 28.8(L)  Platelets 150 - 400 K/uL 178 129(L) 182    BNP (last 3 results)  Recent Labs  08/07/14 1158 08/10/14 1715 08/24/14 1042  BNP 343.0* 354.4* 478.7*    Lab Results  Component Value Date   ESRSEDRATE 65* 05/22/2014    No results found.  CULTURES: Influenza PCR 7/25 >> negative Legionella total Ab 7/25 >> negative Legionella urine Ag 7/25  >> negative Pneumococcal urine Ag 7/25 >> negative Blood 7/25 >> Respiratory viral panel 7/26 >>   ANTIBIOTICS: Ceftazidime 7/25 >> Vancomycin 7/25 >>    ASSESSMENT / PLAN:  Acute hypoxic respiratory failure -  Pulmonary infiltrates -- Diffuse, bilateral.  Suspect r/t pulmonary edema or pneumonitis (?r/t denosumab v everolimus) v lymphangitic spread of stage IV breast ca.  Less likely HCAP or atypical infection.  REC -  Continue aggressive diuresis as tol  Consider empiric steroids - hold for now given significant improvement clinically with diuresis  Consider narrow abx  Pulmonary hygiene  RVP pending  Sputum culture if able  F/u CXR  Of note - pt would NOT want further aggressive w/u or treatment if we think this is lymphangitic spread (ie not "just" pulmonary edema or something "easily fixable").  Pt is DNR.    Stage IV breast cancer with bone mets  CKD III  DM  HTN  P;  Per primary    Nickolas Madrid, NP 08/27/2014  9:28 AM Pager: [phone] or [phone]

## 2014-08-28 ENCOUNTER — Inpatient Hospital Stay (HOSPITAL_COMMUNITY): Payer: Medicare Other

## 2014-08-28 DIAGNOSIS — C50919 Malignant neoplasm of unspecified site of unspecified female breast: Secondary | ICD-10-CM

## 2014-08-28 DIAGNOSIS — R040 Epistaxis: Secondary | ICD-10-CM

## 2014-08-28 LAB — RESPIRATORY VIRUS PANEL
Adenovirus: NEGATIVE
INFLUENZA A: NEGATIVE
Influenza B: NEGATIVE
METAPNEUMOVIRUS: NEGATIVE
PARAINFLUENZA 1 A: NEGATIVE
PARAINFLUENZA 3 A: NEGATIVE
Parainfluenza 2: NEGATIVE
RESPIRATORY SYNCYTIAL VIRUS B: NEGATIVE
RHINOVIRUS: NEGATIVE
Respiratory Syncytial Virus A: NEGATIVE

## 2014-08-28 LAB — CBC WITH DIFFERENTIAL/PLATELET
BASOS ABS: 0 10*3/uL (ref 0.0–0.1)
BASOS PCT: 0 % (ref 0–1)
Eosinophils Absolute: 0.3 10*3/uL (ref 0.0–0.7)
Eosinophils Relative: 5 % (ref 0–5)
HCT: 26.9 % — ABNORMAL LOW (ref 36.0–46.0)
HEMOGLOBIN: 8.5 g/dL — AB (ref 12.0–15.0)
Lymphocytes Relative: 23 % (ref 12–46)
Lymphs Abs: 1.4 10*3/uL (ref 0.7–4.0)
MCH: 27.7 pg (ref 26.0–34.0)
MCHC: 31.6 g/dL (ref 30.0–36.0)
MCV: 87.6 fL (ref 78.0–100.0)
MONOS PCT: 11 % (ref 3–12)
Monocytes Absolute: 0.7 10*3/uL (ref 0.1–1.0)
NEUTROS ABS: 3.7 10*3/uL (ref 1.7–7.7)
NEUTROS PCT: 61 % (ref 43–77)
Platelets: 149 10*3/uL — ABNORMAL LOW (ref 150–400)
RBC: 3.07 MIL/uL — AB (ref 3.87–5.11)
RDW: 16.7 % — ABNORMAL HIGH (ref 11.5–15.5)
WBC: 6.1 10*3/uL (ref 4.0–10.5)

## 2014-08-28 LAB — BASIC METABOLIC PANEL
Anion gap: 14 (ref 5–15)
BUN: 26 mg/dL — ABNORMAL HIGH (ref 6–20)
CALCIUM: 6.8 mg/dL — AB (ref 8.9–10.3)
CO2: 28 mmol/L (ref 22–32)
CREATININE: 1.38 mg/dL — AB (ref 0.44–1.00)
Chloride: 96 mmol/L — ABNORMAL LOW (ref 101–111)
GFR calc Af Amer: 43 mL/min — ABNORMAL LOW (ref >60)
GFR calc non Af Amer: 37 mL/min — ABNORMAL LOW (ref >60)
GLUCOSE: 122 mg/dL — AB (ref 65–99)
Potassium: 3.6 mmol/L (ref 3.5–5.1)
SODIUM: 138 mmol/L (ref 135–145)

## 2014-08-28 LAB — GLUCOSE, CAPILLARY
GLUCOSE-CAPILLARY: 190 mg/dL — AB (ref 65–99)
Glucose-Capillary: 146 mg/dL — ABNORMAL HIGH (ref 65–99)
Glucose-Capillary: 243 mg/dL — ABNORMAL HIGH (ref 65–99)
Glucose-Capillary: 177 mg/dL — ABNORMAL HIGH (ref 65–99)

## 2014-08-28 MED ORDER — WHITE PETROLATUM GEL
Status: DC | PRN
  Filled 2014-08-28: qty 28.35

## 2014-08-28 MED ORDER — CARVEDILOL 6.25 MG PO TABS
6.2500 mg | ORAL_TABLET | Freq: Two times a day (BID) | ORAL | Status: DC
  Administered 2014-08-28 – 2014-09-01 (×8): 6.25 mg via ORAL
  Filled 2014-08-28 (×8): qty 1

## 2014-08-28 MED ORDER — FUROSEMIDE 10 MG/ML IJ SOLN
60.0000 mg | Freq: Three times a day (TID) | INTRAMUSCULAR | Status: DC
  Administered 2014-08-28 – 2014-09-02 (×16): 60 mg via INTRAVENOUS
  Filled 2014-08-28 (×16): qty 6

## 2014-08-28 MED ORDER — ONDANSETRON HCL 4 MG/2ML IJ SOLN
4.0000 mg | Freq: Four times a day (QID) | INTRAMUSCULAR | Status: DC | PRN
  Administered 2014-08-29 (×2): 4 mg via INTRAVENOUS
  Filled 2014-08-28 (×2): qty 2

## 2014-08-28 MED ORDER — SALINE SPRAY 0.65 % NA SOLN
1.0000 | NASAL | Status: DC | PRN
  Filled 2014-08-28: qty 44

## 2014-08-28 NOTE — Progress Notes (Addendum)
[facility] PROGRESS NOTE  Brittney Cunningham QHU:[id number] DOB: Jan 14, 1941 DOA: 08/24/2014 PCP: [name], MD  Brief Summary  74 y/o ? 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 There was bone only involvement. Tumor was strongly ER and PR positive, HER-2/neu negative, with an MIP-1 of 26%  She is under the care of Dr. [name] and has rec'd multiple regimens with progression.  She started exemestane and everolimus on 07/08/2014 and was admitted 7/12-7/15 with AKI + dyselectrolytemia + possible bronchitis which was treated with levaquin.  She had progressive SOB and hypoxia and was sent from her PCP's office to the ER on 7/26.   Assessment/Plan  Acute respiratory failure with hypoxia, due to possible health care associated pneumonia, pulmonary edema from acute diastolic heart failure, lymphangitic spread of stage IV breast Ca, or possible pneumonitis from everolimus.  She has remained afebrile without leukocytosis and her procalcitonin is negative.  - taper oxygen as tolerated - appreciate Pulmonology assistance - continue flutter valve - monitor off antibiotics - Respiratory Viral panel neg -  Cryptococcal antigen negative -  Aspergillus pending - continue Lasix 37m IV TID but moves up the timing so she does not have to void all night -   -[id number] + 3 voids -  Wt starting to trend down slightly -  Grade 1 DD with preserved EF on last ECHO from 06/2013  Stage IV breast cancer status post mastectomy and chemotherapy -patient is very clear that she would NOT WANT any further aggressive work-up of this if we think this is non- infectious and related to her breast cancer - appreciate oncology assistance  Hypocalcemia, improved with IV  calcium  Hypokalemia - Continue daily potassium while diuresing  Diabetes mellitus type 2 with episode of hypoglycemia, last A1c 7.9 on 06/10/2014 - Continue low dose SSI  CAD with total occlusion of RCA 06/2013 - Continue beta blocker and Imdur  HTN with low normal BPs -  d/c HCTZ 25 daily - Continue to hold CCB  - Decrease beta blocker secondary to low blood pressures   Chronic kidney disease stage III Estimated Creatinine Clearance: 36.8 mL/min (by C-G formula based on Cr of 1.48).  -stable currently  Anemia secondary to chronic disease, hgb 8.53m653ml -  Monitor for bleeding:  Occult stool -  Repeat CBC in AM  Epistaxis -  Nasal saline and vaseline -  Humidify air  Diet:  Regular Access:  port IVF:  off Proph: Lovenox   Code Status: DNR Family Communication: patient and her husband Disposition Plan: pending further diuresis    Consultants:  Oncology, Dr. MagJana Hakimulmonology  Procedures:  CT chest 7/26  Antibiotics:  vanc 7/25 > 7/28  ceftaz 7/25 > 7/28  HPI/Subjective:   Feeling tired and unwell the day. She did not sleep well last night. She voided very frequent during the day and night last night. She had some nausea this morning with heaves.  Overall however, she feels like her breathing is a little better.  Objective: Filed Vitals:   08/27/14 1643 08/27/14 2136 08/28/14 0610 08/28/14 1347  BP:  137/69 104/63 119/40  Pulse:  90 83 87  Temp:  99 F (37.2 C) 98.5 F (36.9 C) 98.7 F (37.1 C)  TempSrc:  Oral Oral Oral  Resp:  _0 Height: _1  (1.575 m)     Weight: 93.214 kg (205 lb 8 oz)  92.08 kg (203 lb)   SpO2:  94% 94% 94%    Intake/Output Summary (Last 24 hours) at 08/28/14 1532 Last data filed at 08/28/14 1524  Gross per 24 hour  Intake    600 ml  Output   2975 ml  Net  -2375 ml   Filed Weights   08/24/14 1343 08/27/14 1643 08/28/14 0610  Weight: 90.26 kg (198 lb 15.8 oz) 93.214 kg (205 lb 8 oz) 92.08 kg (203 lb)    Body mass index is 37.12 kg/(m^2).  Exam:   General:  Adult female, No acute distress, rhonchorous coughCommon tired appearing   HEENT:  NCAT, MMM  Cardiovascular:  RRR, nl S1, S2 no mrg, 2+ pulses, warm extremities  Respiratory:  Rales throughout, no wheezes, no rhonchi, no increased WOB  Abdomen:   NABS, soft, NT/ND  MSK:   Normal tone and bulk, 2+ pitting edema bilateral LEE  Neuro:  Grossly intact  Data Reviewed: Basic Metabolic Panel:  Recent Labs Lab 08/24/14 1410 08/25/14 0525 08/26/14 0550 08/27/14 0420 08/28/14 0504  NA 140 142 138 139 138  K 3.1* 3.7 3.6 3.8 3.6  CL 105 106 104 100* 96*  CO2 _2 GLUCOSE 104* 92 147* 118* 122*  BUN 31* 27* 26* 25* 26*  CREATININE 1.51* 1.48* 1.46* 1.38* 1.38*  CALCIUM 6.5* 6.6* 6.3* 7.0* 6.8*  MG 1.4*  --   --   --   --   PHOS 2.1*  --   --   --   --    Liver Function Tests:  Recent Labs Lab 08/24/14 1410 08/25/14 0525 08/26/14 0550  AST _3 ALT 13* 12* 12*  ALKPHOS 77 74 70  BILITOT 0.6 0.4 0.6  PROT 7.2 6.9 5.9*  ALBUMIN 2.5* 2.4* 2.1*   No results for input(s): LIPASE, AMYLASE in the last 168 hours. No results for input(s): AMMONIA in the last 168 hours. CBC:  Recent Labs Lab 08/24/14 1410 08/25/14 0525 08/26/14 0550 08/27/14 0420 08/28/14 0504  WBC 6.3 6.3 4.7 6.5 6.1  NEUTROABS 4.2  --  2.7 3.8 3.7  HGB 9.9* 9.2* 7.5* 8.9* 8.5*  HCT 30.8* 28.8* 23.8* 27.6* 26.9*  MCV 85.1 87.3 87.5 87.3 87.6  PLT 161 182 129* 178 149*    Recent Results (from the past 240 hour(s))  Culture, blood (routine x 2) Call MD if unable to obtain prior to antibiotics being given     Status: None (Preliminary result)   Collection Time: 08/24/14  2:00 PM  Result Value Ref Range Status   Specimen Description BLOOD LEFT HAND  Final   Special Requests IN PEDIATRIC BOTTLE 4CC  Final   Culture   Final    NO GROWTH 4 DAYS Performed at [facility]    Report Status PENDING  Incomplete   Culture, blood (routine x 2) Call MD if unable to obtain prior to antibiotics being given     Status: None (Preliminary result)   Collection Time: 08/24/14  2:10 PM  Result Value Ref Range Status   Specimen Description BLOOD LEFT ARM  Final   Special Requests BOTTLES DRAWN AEROBIC AND ANAEROBIC 10CC  Final   Culture   Final    NO GROWTH 4 DAYS Performed at [facility]    Report Status PENDING  Incomplete  Respiratory  virus panel     Status: None   Collection Time: 08/25/14  7:19 PM  Result Value Ref Range Status   Source - RVPAN NASAL WASHINGS  Corrected   Respiratory Syncytial Virus A Negative Negative Final   Respiratory Syncytial Virus B Negative Negative Final   Influenza A Negative Negative Final   Influenza B Negative Negative Final   Parainfluenza 1 Negative Negative Final   Parainfluenza 2 Negative Negative Final   Parainfluenza 3 Negative Negative Final   Metapneumovirus Negative Negative Final   Rhinovirus Negative Negative Final   Adenovirus Negative Negative Final    Comment: (NOTE) Performed At: [facility] [place] [facility] [place], [place] [id number] Lindon Romp MD HL:[id number]      Studies: Dg Chest 2 View  08/28/2014   CLINICAL DATA:  History of metastatic breast carcinoma with shortness of Breath  EXAM: CHEST - 2 VIEW  COMPARISON:  08/24/2014, 08/25/2014  FINDINGS: Cardiac shadow is stable. A right chest wall port is again seen in satisfactory position. Patchy infiltrates are again identified throughout both lungs. Diffuse bony metastatic disease is identified. Aortic calcifications are seen and stable.  IMPRESSION: Stable bilateral infiltrates when compare with the prior exams.  Diffuse bony metastatic disease.   Electronically Signed   By: Inez Catalina M.D.   On: 08/28/2014 09:41    Scheduled Meds: . amLODipine  10 mg Oral Daily  . aspirin  81 mg Oral Daily  . atorvastatin  80 mg Oral Daily  . calcium carbonate  1 tablet Oral TID WC   . carvedilol  25 mg Oral BID WC  . cholecalciferol  1,000 Units Oral Daily  . clopidogrel  75 mg Oral Daily  . enoxaparin (LOVENOX) injection  40 mg Subcutaneous Q24H  . exemestane  25 mg Oral QPC breakfast  . furosemide  60 mg Intravenous TID BM  . gabapentin  100 mg Oral TID  . insulin aspart  0-9 Units Subcutaneous TID WC  . isosorbide mononitrate  60 mg Oral BID  . levothyroxine  137 mcg Oral QAC breakfast  . morphine  30 mg Oral BID  . pantoprazole  40 mg Oral Daily  . potassium chloride  40 mEq Oral Daily  . senna-docusate  1 tablet Oral QHS  . sodium chloride  10-40 mL Intracatheter Q12H   Continuous Infusions:   Principal Problem:   Acute on chronic diastolic heart failure Active Problems:   Diabetes mellitus type 2 in obese   Essential hypertension   Breast cancer metastasized to bone   CAD (coronary artery disease), native coronary artery   HCAP (healthcare-associated pneumonia)   Malnutrition of moderate degree    Time spent: 30 min    Kaevon Cotta, [place] [facility] Pager [phone]. If 7PM-7AM, please contact night-coverage at [email or web address], password [facility] 08/28/2014, 3:32 PM  LOS: 4 days

## 2014-08-28 NOTE — Progress Notes (Addendum)
CSW continuing to follow for disposition planning.  Per MD, pt not yet medically ready for discharge and no anticipation of weekend discharge.   CSW met with pt and pt spouse at bedside to follow up regarding SNF bed offers. Pt expressed that she is very hopeful to be able to return home when discharged from the hospital, but open to having options for rehab at SNF available. CSW explained that many facilities responded considering and asking about clarification about medications pt receiving for cancer. Pt shared that she has the supply of medication at home as she recognizes that the medications are very expensive and agreeable to signing medication over to rehab facility if she does end up choosing to go to rehab. Pt stated that one medication is on hold at this time and she pt has not yet heard if this medication will be resumed. CSW provided the bed offers available and explained that CSW can send updated information regarding the treatments to get updated bed offers.  CSW updated pt information and re-initiated SNF search to [facility] and [place].  CSW to follow up with pt on Monday to provide support and continue discussion surrounding disposition planning. Pt goal is to return home from hospital, but agreeable to continued discussion about rehab in case pt does not feel safe to return home when medically ready for discharge.   Addendum :  CSW provided pt with updated SNF bed offers at bedside.  CSW to continue to follow.  Alison Murray, MSW, [place] [facility] [phone]

## 2014-08-28 NOTE — Progress Notes (Signed)
Name: Brittney Cunningham MRN: [id number] DOB: April 26, 1940    ADMISSION DATE:  08/24/2014 CONSULTATION DATE:  08/27/2014  REFERRING MD :  Dr. Sheran Fava  CHIEF COMPLAINT:  Dyspnea  BRIEF PATIENT DESCRIPTION:  74 yo female with remote hx of smoking admitted with cough, dyspnea, hypoxia, and pulmonary infiltrates initially thought to be HCAP.  She has hx of Stage IV breast cancer dx in 2011 with diffuse bone mets s/p Rt modified radical mastectomy, XRT, letrozole >> changed to fulvestrant until 06/03/14, zoledronic acid >> changed to denosumab on 07/24/14, and exemestane and everolimus started 07/08/14.  SIGNIFICANT EVENTS  7/11 to 7/13  Admit for hypocalcemia, pneumonia 7/25  Readmitted, oncology consulted, DNR status established.  STUDIES:  6/07 Echo >> EF 55 to 52%, grade 1 diastolic dysfx 8/41 CT chest >> 5 mm nodule Rt lung base, diffuse sclerotic lesions 7/26 CT chest >> diffuse dense ASD with air bronchograms b/l    SUBJECTIVE:  Pt reports feeling very tired as she did not rest overnight - up and down to the bathroom.  Net neg 780 ml for past 24 hours.  She reports non-productive cough (inability to bring sputum up but cough is moist).  O2 needs down to 2L/[place].    VITAL SIGNS: Temp:  [98.1 F (36.7 C)-99 F (37.2 C)] 98.5 F (36.9 C) (07/29 0610) Pulse Rate:  [83-90] 83 (07/29 0610) Resp:  [16-19] 18 (07/29 0610) BP: (104-137)/(44-69) 104/63 mmHg (07/29 0610) SpO2:  [94 %-95 %] 94 % (07/29 0610) Weight:  [203 lb (92.08 kg)-205 lb 8 oz (93.214 kg)] 203 lb (92.08 kg) (07/29 0610)  PHYSICAL EXAMINATION: General:  Very pleasant, chronically ill appearing female, NAD sitting on side of bed Neuro:  Awake, alert,appropriate, generalized weakness  HEENT:  Mm moist, no JVD  Cardiovascular:  s1s2 rrr Lungs:  resps even, non labored on [place], posterior crackles 2/3 way up, no audible wheeze  Abdomen:  Round, soft, non tender, bsx4 active  Musculoskeletal:  Warm and dry, 2+ BLE edema    CMP  Latest Ref Rng 08/28/2014 08/27/2014 08/26/2014  Glucose 65 - 99 mg/dL 122(H) 118(H) 147(H)  BUN 6 - 20 mg/dL 26(H) 25(H) 26(H)  Creatinine 0.44 - 1.00 mg/dL 1.38(H) 1.38(H) 1.46(H)  Sodium 135 - 145 mmol/L 138 139 138  Potassium 3.5 - 5.1 mmol/L 3.6 3.8 3.6  Chloride 101 - 111 mmol/L 96(L) 100(L) 104  CO2 22 - 32 mmol/L _0 Calcium 8.9 - 10.3 mg/dL 6.8(L) 7.0(L) 6.3(LL)  Total Protein 6.5 - 8.1 g/dL - - 5.9(L)  Total Bilirubin 0.3 - 1.2 mg/dL - - 0.6  Alkaline Phos 38 - 126 U/L - - 70  AST 15 - 41 U/L - - 29  ALT 14 - 54 U/L - - 12(L)    CBC Latest Ref Rng 08/28/2014 08/27/2014 08/26/2014  WBC 4.0 - 10.5 K/uL 6.1 6.5 4.7  Hemoglobin 12.0 - 15.0 g/dL 8.5(L) 8.9(L) 7.5(L)  Hematocrit 36.0 - 46.0 % 26.9(L) 27.6(L) 23.8(L)  Platelets 150 - 400 K/uL 149(L) 178 129(L)    BNP (last 3 results)  Recent Labs  08/07/14 1158 08/10/14 1715 08/24/14 1042  BNP 343.0* 354.4* 478.7*    Lab Results  Component Value Date   ESRSEDRATE 124* 08/27/2014    Dg Chest 2 View  08/28/2014   CLINICAL DATA:  History of metastatic breast carcinoma with shortness of Breath  EXAM: CHEST - 2 VIEW  COMPARISON:  08/24/2014, 08/25/2014  FINDINGS: Cardiac shadow is stable. A right  chest wall port is again seen in satisfactory position. Patchy infiltrates are again identified throughout both lungs. Diffuse bony metastatic disease is identified. Aortic calcifications are seen and stable.  IMPRESSION: Stable bilateral infiltrates when compare with the prior exams.  Diffuse bony metastatic disease.   Electronically Signed   By: Inez Catalina M.D.   On: 08/28/2014 09:41    CULTURES: Influenza PCR 7/25 >> negative Legionella total Ab 7/25 >> negative Legionella urine Ag 7/25 >> negative Pneumococcal urine Ag 7/25 >> negative Blood 7/25 >> Respiratory viral panel 7/26 >> negative  Sed Rate 7/28 >> 124   ANTIBIOTICS: Ceftazidime 7/25 >> 7/28 Vancomycin 7/25 >> 7/28   ASSESSMENT / PLAN:  Acute hypoxic  respiratory failure - secondary to diffuse bilateral infiltrates Pulmonary infiltrates - Diffuse, bilateral.  Suspect r/t pulmonary edema or pneumonitis (?r/t denosumab v everolimus) v lymphangitic spread of stage IV breast ca.  Less likely HCAP or atypical infection.    PLAN: Continue aggressive diuresis as renal function / BP permit Consider empiric steroids - ESR 124, however, had significant improvement clinically with diuresis (O2 needs reduced to 2L/[place]).  Will discuss with Attending MD.   Monitor off abx, follow fever curve / WBC Pulmonary hygiene:  IS, mobilize Sputum culture if able  Intermittent CXR to follow infiltrates Of note - pt would NOT want further aggressive w/u or treatment if we think this is lymphangitic spread (ie not "just" pulmonary edema or something "easily fixable").  Pt is DNR.    Stage IV breast cancer with bone mets  CKD III  DM  HTN   PLAN: Per primary    Noe Gens, NP-C [facility] Pgr: [phone] or if no answer [phone] 08/28/2014, 10:44 AM

## 2014-08-29 DIAGNOSIS — I10 Essential (primary) hypertension: Secondary | ICD-10-CM

## 2014-08-29 DIAGNOSIS — R110 Nausea: Secondary | ICD-10-CM

## 2014-08-29 LAB — CULTURE, BLOOD (ROUTINE X 2)
CULTURE: NO GROWTH
CULTURE: NO GROWTH

## 2014-08-29 LAB — GLUCOSE, CAPILLARY
GLUCOSE-CAPILLARY: 132 mg/dL — AB (ref 65–99)
Glucose-Capillary: 158 mg/dL — ABNORMAL HIGH (ref 65–99)
Glucose-Capillary: 161 mg/dL — ABNORMAL HIGH (ref 65–99)
Glucose-Capillary: 201 mg/dL — ABNORMAL HIGH (ref 65–99)

## 2014-08-29 LAB — CBC WITH DIFFERENTIAL/PLATELET
BASOS ABS: 0 10*3/uL (ref 0.0–0.1)
Basophils Relative: 0 % (ref 0–1)
Eosinophils Absolute: 0.3 10*3/uL (ref 0.0–0.7)
Eosinophils Relative: 4 % (ref 0–5)
HCT: 26.3 % — ABNORMAL LOW (ref 36.0–46.0)
Hemoglobin: 8.4 g/dL — ABNORMAL LOW (ref 12.0–15.0)
LYMPHS ABS: 1.5 10*3/uL (ref 0.7–4.0)
Lymphocytes Relative: 22 % (ref 12–46)
MCH: 27.7 pg (ref 26.0–34.0)
MCHC: 31.9 g/dL (ref 30.0–36.0)
MCV: 86.8 fL (ref 78.0–100.0)
MONO ABS: 0.8 10*3/uL (ref 0.1–1.0)
Monocytes Relative: 12 % (ref 3–12)
Neutro Abs: 4 10*3/uL (ref 1.7–7.7)
Neutrophils Relative %: 62 % (ref 43–77)
Platelets: 153 10*3/uL (ref 150–400)
RBC: 3.03 MIL/uL — AB (ref 3.87–5.11)
RDW: 16.8 % — AB (ref 11.5–15.5)
WBC: 6.6 10*3/uL (ref 4.0–10.5)

## 2014-08-29 LAB — BASIC METABOLIC PANEL
ANION GAP: 14 (ref 5–15)
BUN: 28 mg/dL — ABNORMAL HIGH (ref 6–20)
CO2: 29 mmol/L (ref 22–32)
CREATININE: 1.36 mg/dL — AB (ref 0.44–1.00)
Calcium: 6.8 mg/dL — ABNORMAL LOW (ref 8.9–10.3)
Chloride: 92 mmol/L — ABNORMAL LOW (ref 101–111)
GFR, EST AFRICAN AMERICAN: 44 mL/min — AB (ref >60)
GFR, EST NON AFRICAN AMERICAN: 38 mL/min — AB (ref >60)
Glucose, Bld: 143 mg/dL — ABNORMAL HIGH (ref 65–99)
Potassium: 3.5 mmol/L (ref 3.5–5.1)
SODIUM: 135 mmol/L (ref 135–145)

## 2014-08-29 LAB — ASPERGILLUS GALACTOMANNAN ANTIGEN
ASPERGILLUS GALACTOMANNAN AG: NOT DETECTED
Aspergillus galactomannan Index: 0.12 (ref <0.50)

## 2014-08-29 LAB — OCCULT BLOOD X 1 CARD TO LAB, STOOL: FECAL OCCULT BLD: POSITIVE — AB

## 2014-08-29 LAB — PROCALCITONIN: Procalcitonin: 0.24 ng/mL

## 2014-08-29 MED ORDER — ACETAMINOPHEN 325 MG PO TABS
650.0000 mg | ORAL_TABLET | Freq: Four times a day (QID) | ORAL | Status: DC | PRN
  Administered 2014-08-29 – 2014-08-30 (×2): 650 mg via ORAL
  Filled 2014-08-29 (×2): qty 2

## 2014-08-29 MED ORDER — ONDANSETRON HCL 4 MG PO TABS
4.0000 mg | ORAL_TABLET | Freq: Three times a day (TID) | ORAL | Status: DC
  Administered 2014-08-29 – 2014-09-03 (×14): 4 mg via ORAL
  Filled 2014-08-29 (×14): qty 1

## 2014-08-29 MED ORDER — ALUM & MAG HYDROXIDE-SIMETH 200-200-20 MG/5ML PO SUSP: 15.0000 mL | ORAL | Status: DC | PRN

## 2014-08-29 NOTE — Progress Notes (Signed)
[facility] PROGRESS NOTE  Brittney Cunningham WGY:[id number] DOB: [birth year] DOA: 08/24/2014 PCP: [name], MD  Brief Summary  74 y/o ? 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 There was bone only involvement. Tumor was strongly ER and PR positive, HER-2/neu negative, with an MIP-1 of 26%  She is under the care of Dr. [name] and has rec'd multiple regimens with progression.  She started exemestane and everolimus on 07/08/2014 and was admitted 7/12-7/15 with AKI + dyselectrolytemia + possible bronchitis which was treated with levaquin.  She had progressive SOB and hypoxia and was sent from her PCP's office to the ER on 7/26.   Assessment/Plan  Acute respiratory failure with hypoxia, due to possible health care associated pneumonia, pulmonary edema from acute diastolic heart failure, lymphangitic spread of stage IV breast Ca, or possible pneumonitis from everolimus.  She has remained afebrile without leukocytosis and her procalcitonin is negative.  - taper oxygen as tolerated - appreciate Pulmonology assistance - continue flutter valve - monitor off antibiotics - Respiratory Viral panel neg -  Cryptococcal antigen negative -  Aspergillus pending -  Continue Lasix 70m IV TID but moves up the timing so she does not have to void all night -   -1.8L yest -  Wt trending down -  Grade 1 DD with preserved EF on last ECHO from 06/2013  Stage IV breast cancer status post mastectomy and chemotherapy - no further aggressive work-up related to her breast cancer - appreciate oncology assistance  Hypocalcemia, improved with IV calcium  Hypokalemia - Continue daily potassium while diuresing  Diabetes mellitus type 2 with episode of hypoglycemia, last A1c 7.9  on 06/10/2014 - Continue low dose SSI  CAD with total occlusion of RCA 06/2013 - Continue beta blocker and Imdur  HTN with low normal BPs -  d/c HCTZ 25 daily - Continue to hold CCB  - Decrease beta blocker secondary to low blood pressures   Chronic kidney disease stage III Estimated Creatinine Clearance: 36.8 mL/min (by C-G formula based on Cr of 1.48).  -stable currently  Anemia secondary to chronic disease, hgb 8.970mdl -  Monitor for bleeding:  Occult stool -  Repeat CBC in AM  Epistaxis -  Nasal saline and vaseline -  Humidify air  Stasis dermatitis -  Elevate legs  Diet:  Regular Access:  port IVF:  off Proph: Lovenox   Code Status: DNR Family Communication: patient and her husband Disposition Plan: pending further diuresis    Consultants:  Oncology, Dr. MaJana HakimPulmonology  Procedures:  CT chest 7/26  Antibiotics:  vanc 7/25 > 7/28  ceftaz 7/25 > 7/28  HPI/Subjective:   Feeling tired and unwell again today with nausea.  Breathing and swelling are gradually improving.  Objective: Filed Vitals:   08/28/14 2013 08/29/14 0516 08/29/14 0947 08/29/14 0950  BP: 117/42 111/46 118/43 118/43  Pulse: 93 83 78   Temp: 98.3 F (36.8 C) 98.9 F (37.2 C)    TempSrc: Oral Oral    Resp: 17 18    Height:      Weight:  91.627 kg (202 lb)    SpO2: 96% 96%      Intake/Output Summary (Last 24 hours) at 08/29/14 1423 Last data filed at 08/29/14 1319  Gross per 24  hour  Intake    720 ml  Output   2700 ml  Net  -1980 ml   Filed Weights   08/27/14 1643 08/28/14 0610 08/29/14 0516  Weight: 93.214 kg (205 lb 8 oz) 92.08 kg (203 lb) 91.627 kg (202 lb)   Body mass index is 36.94 kg/(m^2).  Exam:   General:  Adult female, No acute distress, still tired appearing   HEENT:  NCAT, MMM  Cardiovascular:  RRR, nl S1, S2 no mrg, 2+ pulses, warm extremities  Respiratory:  Rales at the bilateral bases, no wheezes, no rhonchi, no increased WOB  Abdomen:    NABS, soft, NT/ND  MSK:   Normal tone and bulk, 2+ pitting edema bilateral LEE with some slight pinkness that is equal and bilateral  Neuro:  Grossly intact  Data Reviewed: Basic Metabolic Panel:  Recent Labs Lab 08/24/14 1410 08/25/14 0525 08/26/14 0550 08/27/14 0420 08/28/14 0504 08/29/14 0515  NA 140 142 138 139 138 135  K 3.1* 3.7 3.6 3.8 3.6 3.5  CL 105 106 104 100* 96* 92*  CO2 _0 GLUCOSE 104* 92 147* 118* 122* 143*  BUN 31* 27* 26* 25* 26* 28*  CREATININE 1.51* 1.48* 1.46* 1.38* 1.38* 1.36*  CALCIUM 6.5* 6.6* 6.3* 7.0* 6.8* 6.8*  MG 1.4*  --   --   --   --   --   PHOS 2.1*  --   --   --   --   --    Liver Function Tests:  Recent Labs Lab 08/24/14 1410 08/25/14 0525 08/26/14 0550  AST _1 ALT 13* 12* 12*  ALKPHOS 77 74 70  BILITOT 0.6 0.4 0.6  PROT 7.2 6.9 5.9*  ALBUMIN 2.5* 2.4* 2.1*   No results for input(s): LIPASE, AMYLASE in the last 168 hours. No results for input(s): AMMONIA in the last 168 hours. CBC:  Recent Labs Lab 08/24/14 1410 08/25/14 0525 08/26/14 0550 08/27/14 0420 08/28/14 0504 08/29/14 0515  WBC 6.3 6.3 4.7 6.5 6.1 6.6  NEUTROABS 4.2  --  2.7 3.8 3.7 4.0  HGB 9.9* 9.2* 7.5* 8.9* 8.5* 8.4*  HCT 30.8* 28.8* 23.8* 27.6* 26.9* 26.3*  MCV 85.1 87.3 87.5 87.3 87.6 86.8  PLT 161 182 129* 178 149* 153    Recent Results (from the past 240 hour(s))  Culture, blood (routine x 2) Call MD if unable to obtain prior to antibiotics being given     Status: None   Collection Time: 08/24/14  2:00 PM  Result Value Ref Range Status   Specimen Description BLOOD LEFT HAND  Final   Special Requests IN PEDIATRIC BOTTLE 4CC  Final   Culture   Final    NO GROWTH 5 DAYS Performed at [facility]    Report Status 08/29/2014 FINAL  Final  Culture, blood (routine x 2) Call MD if unable to obtain prior to antibiotics being given     Status: None   Collection Time: 08/24/14  2:10 PM  Result Value Ref Range Status    Specimen Description BLOOD LEFT ARM  Final   Special Requests BOTTLES DRAWN AEROBIC AND ANAEROBIC 10CC  Final   Culture   Final    NO GROWTH 5 DAYS Performed at [facility]    Report Status 08/29/2014 FINAL  Final  Respiratory virus panel     Status: None   Collection Time: 08/25/14  7:19 PM  Result Value Ref Range Status  Source - RVPAN NASAL WASHINGS  Corrected   Respiratory Syncytial Virus A Negative Negative Final   Respiratory Syncytial Virus B Negative Negative Final   Influenza A Negative Negative Final   Influenza B Negative Negative Final   Parainfluenza 1 Negative Negative Final   Parainfluenza 2 Negative Negative Final   Parainfluenza 3 Negative Negative Final   Metapneumovirus Negative Negative Final   Rhinovirus Negative Negative Final   Adenovirus Negative Negative Final    Comment: (NOTE) Performed At: [facility] [place], [place] [id number] Lindon Romp MD LK:[id number]      Studies: Dg Chest 2 View  08/28/2014   CLINICAL DATA:  History of metastatic breast carcinoma with shortness of Breath  EXAM: CHEST - 2 VIEW  COMPARISON:  08/24/2014, 08/25/2014  FINDINGS: Cardiac shadow is stable. A right chest wall port is again seen in satisfactory position. Patchy infiltrates are again identified throughout both lungs. Diffuse bony metastatic disease is identified. Aortic calcifications are seen and stable.  IMPRESSION: Stable bilateral infiltrates when compare with the prior exams.  Diffuse bony metastatic disease.   Electronically Signed   By: Inez Catalina M.D.   On: 08/28/2014 09:41    Scheduled Meds: . amLODipine  10 mg Oral Daily  . aspirin  81 mg Oral Daily  . atorvastatin  80 mg Oral Daily  . calcium carbonate  1 tablet Oral TID WC  . carvedilol  6.25 mg Oral BID WC  . cholecalciferol  1,000 Units Oral Daily  . clopidogrel  75 mg Oral Daily  . enoxaparin (LOVENOX) injection  40 mg Subcutaneous Q24H  . exemestane  25 mg Oral  QPC breakfast  . furosemide  60 mg Intravenous TID BM  . gabapentin  100 mg Oral TID  . insulin aspart  0-9 Units Subcutaneous TID WC  . isosorbide mononitrate  60 mg Oral BID  . levothyroxine  137 mcg Oral QAC breakfast  . morphine  30 mg Oral BID  . pantoprazole  40 mg Oral Daily  . potassium chloride  40 mEq Oral Daily  . senna-docusate  1 tablet Oral QHS  . sodium chloride  10-40 mL Intracatheter Q12H   Continuous Infusions:   Principal Problem:   Acute on chronic diastolic heart failure Active Problems:   Diabetes mellitus type 2 in obese   Essential hypertension   Breast cancer metastasized to bone   CAD (coronary artery disease), native coronary artery   Nausea without vomiting   HCAP (healthcare-associated pneumonia)   Malnutrition of moderate degree   Epistaxis    Time spent: 30 min    [place] Ewen, [place] [facility] Pager [phone]. If 7PM-7AM, please contact night-coverage at [email or web address], password [facility] 08/29/2014, 2:23 PM  LOS: 5 days

## 2014-08-30 ENCOUNTER — Inpatient Hospital Stay (HOSPITAL_COMMUNITY): Payer: Medicare Other

## 2014-08-30 DIAGNOSIS — I2510 Atherosclerotic heart disease of native coronary artery without angina pectoris: Secondary | ICD-10-CM

## 2014-08-30 DIAGNOSIS — J189 Pneumonia, unspecified organism: Secondary | ICD-10-CM

## 2014-08-30 LAB — GLUCOSE, CAPILLARY
GLUCOSE-CAPILLARY: 165 mg/dL — AB (ref 65–99)
Glucose-Capillary: 167 mg/dL — ABNORMAL HIGH (ref 65–99)
Glucose-Capillary: 171 mg/dL — ABNORMAL HIGH (ref 65–99)
Glucose-Capillary: 308 mg/dL — ABNORMAL HIGH (ref 65–99)

## 2014-08-30 LAB — CBC WITH DIFFERENTIAL/PLATELET
Basophils Absolute: 0 10*3/uL (ref 0.0–0.1)
Basophils Relative: 0 % (ref 0–1)
Eosinophils Absolute: 0.3 10*3/uL (ref 0.0–0.7)
Eosinophils Relative: 4 % (ref 0–5)
HCT: 24.6 % — ABNORMAL LOW (ref 36.0–46.0)
Hemoglobin: 7.8 g/dL — ABNORMAL LOW (ref 12.0–15.0)
Lymphocytes Relative: 21 % (ref 12–46)
Lymphs Abs: 1.4 10*3/uL (ref 0.7–4.0)
MCH: 27.7 pg (ref 26.0–34.0)
MCHC: 31.7 g/dL (ref 30.0–36.0)
MCV: 87.2 fL (ref 78.0–100.0)
Monocytes Absolute: 0.7 10*3/uL (ref 0.1–1.0)
Monocytes Relative: 11 % (ref 3–12)
Neutro Abs: 4.3 10*3/uL (ref 1.7–7.7)
Neutrophils Relative %: 64 % (ref 43–77)
Platelets: 152 10*3/uL (ref 150–400)
RBC: 2.82 MIL/uL — ABNORMAL LOW (ref 3.87–5.11)
RDW: 16.7 % — ABNORMAL HIGH (ref 11.5–15.5)
WBC: 6.7 10*3/uL (ref 4.0–10.5)

## 2014-08-30 LAB — BASIC METABOLIC PANEL
Anion gap: 12 (ref 5–15)
BUN: 28 mg/dL — AB (ref 6–20)
CALCIUM: 7 mg/dL — AB (ref 8.9–10.3)
CO2: 32 mmol/L (ref 22–32)
Chloride: 92 mmol/L — ABNORMAL LOW (ref 101–111)
Creatinine, Ser: 1.45 mg/dL — ABNORMAL HIGH (ref 0.44–1.00)
GFR, EST AFRICAN AMERICAN: 41 mL/min — AB (ref >60)
GFR, EST NON AFRICAN AMERICAN: 35 mL/min — AB (ref >60)
Glucose, Bld: 152 mg/dL — ABNORMAL HIGH (ref 65–99)
Potassium: 3.8 mmol/L (ref 3.5–5.1)
Sodium: 136 mmol/L (ref 135–145)

## 2014-08-30 MED ORDER — ZOLPIDEM TARTRATE 5 MG PO TABS
5.0000 mg | ORAL_TABLET | Freq: Once | ORAL | Status: AC
  Administered 2014-08-30: 5 mg via ORAL
  Filled 2014-08-30: qty 1

## 2014-08-30 MED ORDER — METHYLPREDNISOLONE SODIUM SUCC 125 MG IJ SOLR
80.0000 mg | Freq: Three times a day (TID) | INTRAMUSCULAR | Status: DC
  Administered 2014-08-30 – 2014-09-01 (×6): 80 mg via INTRAVENOUS
  Filled 2014-08-30 (×6): qty 2

## 2014-08-30 NOTE — Care Management Important Message (Signed)
Important Message  Patient Details  Name: Brittney Cunningham MRN: [id number] Date of Birth: 03-15-1940   Medicare Important Message Given:  Yes-third notification given    Apolonio Schneiders, RN 08/30/2014, 3:14 PM

## 2014-08-30 NOTE — Progress Notes (Signed)
[facility] PROGRESS NOTE  Brittney Cunningham MEQ:[id number] DOB: [birth year] DOA: 08/24/2014 PCP: [name], MD  Brief Summary  74 y/o ? 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 There was bone only involvement. Tumor was strongly ER and PR positive, HER-2/neu negative, with an MIP-1 of 26%  She is under the care of Dr. [name] and has rec'd multiple regimens with progression.  She started exemestane and everolimus on 07/08/2014 and was admitted 7/12-7/15 with AKI + dyselectrolytemia + possible bronchitis which was treated with levaquin.  She had progressive SOB and hypoxia and was sent from her PCP's office to the ER on 7/26.   7/25:  Admitted with SOB and hypoxia, started vanc/ceftaz, everolimus discontinued 7/27:  Started diuresis  7/28:  Pulmonology consultation, discontinued antibiotics since cultures neg, afebrile, no leukocytosis, procalc neg 7/30:  Nausea and fatigue despite good diuresis 7/31:  Increasing O2 requirement.  CXR shows worsening infiltrates.  Starting steroids  Assessment/Plan  Acute respiratory failure with hypoxia, due to possible health care associated pneumonia, pulmonary edema from acute diastolic heart failure, lymphangitic spread of stage IV breast Ca, or possible pneumonitis from everolimus.  She has remained afebrile without leukocytosis and her procalcitonin is negative. Increasing O2 requirement despite ongoing diuresis.   -  CXR:  Worsening infiltrates -  Respiratory Viral panel neg -  Cryptococcal antigen negative -  Aspergillus pending -  Continue Lasix $RemoveBefore'60mg'cVBaZKNJiNaVD$  IV TID -   -2.99 L yest -  Wt trending stable -  Grade 1 DD with preserved EF on last ECHO from 06/2013 -  Discussed case with pulmonology:  Start solumedrol $RemoveBeforeDE'80mg'sDgZqYXDuBJDJOe$   IV q8h  Stage IV breast cancer status post mastectomy and chemotherapy - no further aggressive work-up related to her breast cancer - appreciate oncology assistance  Hypocalcemia, improved with IV calcium  Hypokalemia, stable, continue daily potassium while diuresing  Diabetes mellitus type 2, last A1c 7.9 on 06/10/2014, well controlled - Continue low dose SSI, but may need to increase with steroids  CAD with total occlusion of RCA 06/2013 - Continue beta blocker and Imdur  HTN with low normal BPs - Continue to hold CCB  - continue beta blocker  Chronic kidney disease stage III, GFR 37, creatinine 1.2-1.4, stable  Anemia secondary to chronic disease, hgb trending down -  Occult stool positive, but had to strain and may have been contaminated with hemorrhoid bleeding -  Consider transfusion tomorrow if continuing to trend down  Epistaxis, resolving with nasal saline and vaseline and humidified air  Stasis dermatitis, elevate legs  Diet:  Regular Access:  port IVF:  off Proph: continue lovenox   Code Status: DNR Family Communication: patient and her husband Disposition Plan: pending further diuresis   Consultants:  Oncology, Dr. [name]  Pulmonology  Procedures:  CT chest 7/26  Antibiotics:  vanc 7/25 > 7/28  ceftaz 7/25 > 7/28  HPI/Subjective:   Still having nausea and not able to eat much yesterday.  O2 sat mid 70s on 2L [place] overnight so increased to 3L.  States she feels her swelling is going down some.    Objective: Filed Vitals:   08/29/14 1654 08/29/14 2113 08/30/14 0535 08/30/14 1415  BP: 128/63 121/49 121/46 144/50  Pulse: 89 78 100 89  Temp: 98.7 F (37.1  C) 98.5 F (36.9 C) 98.7 F (37.1 C) 98.5 F (36.9 C)  TempSrc: Oral Oral Oral Oral  Resp: $Remo'18 20 20 20  'CwOCt$ Height:      Weight:   91.9 kg (202 lb 9.6 oz)   SpO2: 96% 94% 91% 92%    Intake/Output Summary (Last 24 hours) at 08/30/14 1429 Last data filed at 08/30/14 1300  Gross per 24  hour  Intake    822 ml  Output   3500 ml  Net  -2678 ml   Filed Weights   08/28/14 0610 08/29/14 0516 08/30/14 0535  Weight: 92.08 kg (203 lb) 91.627 kg (202 lb) 91.9 kg (202 lb 9.6 oz)   Body mass index is 37.05 kg/(m^2).  Exam:   General:  Adult female, No acute distress  HEENT:  NCAT, MMM  Cardiovascular:  RRR, nl S1, S2 no mrg, 2+ pulses, warm extremities  Respiratory:  Rales at the bilateral bases, no wheezes, no rhonchi, no increased WOB  Abdomen:   NABS, soft, NT/ND  MSK:   Normal tone and bulk, 2+ pitting edema bilateral LEE although calves look smaller than they did a few days ago  Neuro:  Grossly intact  Data Reviewed: Basic Metabolic Panel:  Recent Labs Lab 08/24/14 1410  08/26/14 0550 08/27/14 0420 08/28/14 0504 08/29/14 0515 08/30/14 0545  NA 140  < > 138 139 138 135 136  K 3.1*  < > 3.6 3.8 3.6 3.5 3.8  CL 105  < > 104 100* 96* 92* 92*  CO2 24  < > $R'22 27 28 29 'kx$ 32  GLUCOSE 104*  < > 147* 118* 122* 143* 152*  BUN 31*  < > 26* 25* 26* 28* 28*  CREATININE 1.51*  < > 1.46* 1.38* 1.38* 1.36* 1.45*  CALCIUM 6.5*  < > 6.3* 7.0* 6.8* 6.8* 7.0*  MG 1.4*  --   --   --   --   --   --   PHOS 2.1*  --   --   --   --   --   --   < > = values in this interval not displayed. Liver Function Tests:  Recent Labs Lab 08/24/14 1410 08/25/14 0525 08/26/14 0550  AST $Re'28 28 29  'xor$ ALT 13* 12* 12*  ALKPHOS 77 74 70  BILITOT 0.6 0.4 0.6  PROT 7.2 6.9 5.9*  ALBUMIN 2.5* 2.4* 2.1*   No results for input(s): LIPASE, AMYLASE in the last 168 hours. No results for input(s): AMMONIA in the last 168 hours. CBC:  Recent Labs Lab 08/26/14 0550 08/27/14 0420 08/28/14 0504 08/29/14 0515 08/30/14 0545  WBC 4.7 6.5 6.1 6.6 6.7  NEUTROABS 2.7 3.8 3.7 4.0 4.3  HGB 7.5* 8.9* 8.5* 8.4* 7.8*  HCT 23.8* 27.6* 26.9* 26.3* 24.6*  MCV 87.5 87.3 87.6 86.8 87.2  PLT 129* 178 149* 153 152    Recent Results (from the past 240 hour(s))  Culture, blood (routine x 2) Call MD if  unable to obtain prior to antibiotics being given     Status: None   Collection Time: 08/24/14  2:00 PM  Result Value Ref Range Status   Specimen Description BLOOD LEFT HAND  Final   Special Requests IN PEDIATRIC BOTTLE 4CC  Final   Culture   Final    NO GROWTH 5 DAYS Performed at [facility]    Report Status 08/29/2014 FINAL  Final  Culture, blood (routine x 2) Call MD if unable to obtain prior to antibiotics  being given     Status: None   Collection Time: 08/24/14  2:10 PM  Result Value Ref Range Status   Specimen Description BLOOD LEFT ARM  Final   Special Requests BOTTLES DRAWN AEROBIC AND ANAEROBIC 10CC  Final   Culture   Final    NO GROWTH 5 DAYS Performed at [facility]    Report Status 08/29/2014 FINAL  Final  Respiratory virus panel     Status: None   Collection Time: 08/25/14  7:19 PM  Result Value Ref Range Status   Source - RVPAN NASAL WASHINGS  Corrected   Respiratory Syncytial Virus A Negative Negative Final   Respiratory Syncytial Virus B Negative Negative Final   Influenza A Negative Negative Final   Influenza B Negative Negative Final   Parainfluenza 1 Negative Negative Final   Parainfluenza 2 Negative Negative Final   Parainfluenza 3 Negative Negative Final   Metapneumovirus Negative Negative Final   Rhinovirus Negative Negative Final   Adenovirus Negative Negative Final    Comment: (NOTE) Performed At: [facility] [place], [place] [id number] Lindon Romp MD BH:[id number]      Studies: Dg Chest Port 1 View  08/30/2014   CLINICAL DATA:  Cynia Abruzzo of breath. Bilateral breast cancer. Valvular heart disease.  EXAM: PORTABLE CHEST - 1 VIEW  COMPARISON:  Radiographs 08/28/2014  FINDINGS: RIGHT port in place. Stable cardiac silhouette. There is interval increase in bilateral pulmonary airspace disease. Diffuse sclerotic metastasis.  IMPRESSION: Interval increase in bilateral pulmonary airspace disease.  Diffuse sclerotic  metastasis.   Electronically Signed   By: Suzy Bouchard M.D.   On: 08/30/2014 10:06    Scheduled Meds: . amLODipine  10 mg Oral Daily  . aspirin  81 mg Oral Daily  . atorvastatin  80 mg Oral Daily  . calcium carbonate  1 tablet Oral TID WC  . carvedilol  6.25 mg Oral BID WC  . cholecalciferol  1,000 Units Oral Daily  . clopidogrel  75 mg Oral Daily  . enoxaparin (LOVENOX) injection  40 mg Subcutaneous Q24H  . exemestane  25 mg Oral QPC breakfast  . furosemide  60 mg Intravenous TID BM  . gabapentin  100 mg Oral TID  . insulin aspart  0-9 Units Subcutaneous TID WC  . isosorbide mononitrate  60 mg Oral BID  . levothyroxine  137 mcg Oral QAC breakfast  . morphine  30 mg Oral BID  . ondansetron  4 mg Oral TID AC  . pantoprazole  40 mg Oral Daily  . potassium chloride  40 mEq Oral Daily  . senna-docusate  1 tablet Oral QHS  . sodium chloride  10-40 mL Intracatheter Q12H   Continuous Infusions:   Principal Problem:   Acute on chronic diastolic heart failure Active Problems:   Diabetes mellitus type 2 in obese   Essential hypertension   Breast cancer metastasized to bone   CAD (coronary artery disease), native coronary artery   Nausea without vomiting   HCAP (healthcare-associated pneumonia)   Malnutrition of moderate degree   Epistaxis    Time spent: 30 min    Lilleigh Hechavarria, [facility] Pager [phone]. If 7PM-7AM, please contact night-coverage at [email or web address], password [facility] 08/30/2014, 2:29 PM  LOS: 6 days

## 2014-08-31 DIAGNOSIS — J189 Pneumonia, unspecified organism: Secondary | ICD-10-CM

## 2014-08-31 DIAGNOSIS — T50905A Adverse effect of unspecified drugs, medicaments and biological substances, initial encounter: Secondary | ICD-10-CM

## 2014-08-31 HISTORY — DX: Adverse effect of unspecified drugs, medicaments and biological substances, initial encounter: T50.905A

## 2014-08-31 HISTORY — DX: Adverse effect of unspecified drugs, medicaments and biological substances, initial encounter: J18.9

## 2014-08-31 LAB — BASIC METABOLIC PANEL
ANION GAP: 13 (ref 5–15)
BUN: 33 mg/dL — ABNORMAL HIGH (ref 6–20)
CHLORIDE: 88 mmol/L — AB (ref 101–111)
CO2: 32 mmol/L (ref 22–32)
CREATININE: 1.57 mg/dL — AB (ref 0.44–1.00)
Calcium: 7 mg/dL — ABNORMAL LOW (ref 8.9–10.3)
GFR calc Af Amer: 37 mL/min — ABNORMAL LOW (ref >60)
GFR calc non Af Amer: 32 mL/min — ABNORMAL LOW (ref >60)
Glucose, Bld: 313 mg/dL — ABNORMAL HIGH (ref 65–99)
Potassium: 3.8 mmol/L (ref 3.5–5.1)
Sodium: 133 mmol/L — ABNORMAL LOW (ref 135–145)

## 2014-08-31 LAB — CBC WITH DIFFERENTIAL/PLATELET
BASOS ABS: 0 10*3/uL (ref 0.0–0.1)
Basophils Relative: 0 % (ref 0–1)
EOS ABS: 0.1 10*3/uL (ref 0.0–0.7)
EOS PCT: 1 % (ref 0–5)
HEMATOCRIT: 24 % — AB (ref 36.0–46.0)
HEMOGLOBIN: 7.7 g/dL — AB (ref 12.0–15.0)
LYMPHS ABS: 1.4 10*3/uL (ref 0.7–4.0)
Lymphocytes Relative: 22 % (ref 12–46)
MCH: 27.8 pg (ref 26.0–34.0)
MCHC: 32.1 g/dL (ref 30.0–36.0)
MCV: 86.6 fL (ref 78.0–100.0)
Monocytes Absolute: 0.2 10*3/uL (ref 0.1–1.0)
Monocytes Relative: 3 % (ref 3–12)
Neutro Abs: 4.7 10*3/uL (ref 1.7–7.7)
Neutrophils Relative %: 74 % (ref 43–77)
Platelets: 163 10*3/uL (ref 150–400)
RBC: 2.77 MIL/uL — ABNORMAL LOW (ref 3.87–5.11)
RDW: 16.6 % — AB (ref 11.5–15.5)
WBC: 6.4 10*3/uL (ref 4.0–10.5)

## 2014-08-31 LAB — PROCALCITONIN: Procalcitonin: 0.35 ng/mL

## 2014-08-31 LAB — GLUCOSE, CAPILLARY
GLUCOSE-CAPILLARY: 302 mg/dL — AB (ref 65–99)
Glucose-Capillary: 403 mg/dL — ABNORMAL HIGH (ref 65–99)
Glucose-Capillary: 333 mg/dL — ABNORMAL HIGH (ref 65–99)

## 2014-08-31 LAB — PREPARE RBC (CROSSMATCH)

## 2014-08-31 MED ORDER — INSULIN ASPART 100 UNIT/ML ~~LOC~~ SOLN
20.0000 [IU] | Freq: Once | SUBCUTANEOUS | Status: AC
  Administered 2014-08-31: 20 [IU] via SUBCUTANEOUS

## 2014-08-31 MED ORDER — INSULIN DETEMIR 100 UNIT/ML ~~LOC~~ SOLN
10.0000 [IU] | Freq: Every day | SUBCUTANEOUS | Status: DC
  Administered 2014-08-31: 10 [IU] via SUBCUTANEOUS
  Filled 2014-08-31 (×2): qty 0.1

## 2014-08-31 MED ORDER — ZOLPIDEM TARTRATE 5 MG PO TABS
5.0000 mg | ORAL_TABLET | Freq: Once | ORAL | Status: AC
  Administered 2014-08-31: 5 mg via ORAL
  Filled 2014-08-31: qty 1

## 2014-08-31 MED ORDER — INSULIN ASPART 100 UNIT/ML ~~LOC~~ SOLN
0.0000 [IU] | Freq: Three times a day (TID) | SUBCUTANEOUS | Status: DC
  Administered 2014-08-31 – 2014-09-01 (×4): 15 [IU] via SUBCUTANEOUS
  Administered 2014-09-02: 11 [IU] via SUBCUTANEOUS
  Administered 2014-09-02: 15 [IU] via SUBCUTANEOUS
  Administered 2014-09-02: 4 [IU] via SUBCUTANEOUS

## 2014-08-31 MED ORDER — SODIUM CHLORIDE 0.9 % IV SOLN
Freq: Once | INTRAVENOUS | Status: AC
  Administered 2014-08-31: 07:00:00 via INTRAVENOUS

## 2014-08-31 MED ORDER — INSULIN ASPART 100 UNIT/ML ~~LOC~~ SOLN
0.0000 [IU] | Freq: Every day | SUBCUTANEOUS | Status: DC
  Administered 2014-08-31 – 2014-09-02 (×3): 3 [IU] via SUBCUTANEOUS

## 2014-08-31 NOTE — Progress Notes (Signed)
Physical Therapy Treatment Patient Details Name: Brittney Cunningham MRN: [id number] DOB: 1940-03-16 Today's Date: 08/31/2014    History of Present Illness 74 year old female with past medical history of breast cancer with bone metastasis, anemia of chronic disease, diabetes, hypertension who was recently hospitalized for severe electrolyte abnormalities and was on empiric abx for pneumonia admitted with Acute respiratory failure with hypoxia / healthcare associated pneumonia    PT Comments    Pt on 2 lts nasal at rest 94%.  Assisted OOB to amb in the hallway with a RW this time for increased support, energy conservation and to increase gait distance.  Mild dyspnea and required 50% VC's on proper purse lip breathing.  One sitting rest break was needed as sats decreased to 84%.  Returned to room, assisted to bathroom then to recliner.  Max c/o fatigue.  Pt will need ST Rehab at SNF to regain prior level of function.   Follow Up Recommendations  SNF;Supervision/Assistance - 24 hour     Equipment Recommendations       Recommendations for Other Services       Precautions / Restrictions Precautions Precautions: Fall Precaution Comments: monitor sats Restrictions Weight Bearing Restrictions: No    Mobility  Bed Mobility Overal bed mobility: Modified Independent             General bed mobility comments: increased time and use of rail  Transfers Overall transfer level: Needs assistance Equipment used: None Transfers: Sit to/from Stand Sit to Stand: Supervision;Min guard         General transfer comment: good use of hands to steady self and good safety cognition  Ambulation/Gait Ambulation/Gait assistance: Supervision Ambulation Distance (Feet): 100 Feet Assistive device: Rolling walker (2 wheeled) (youth) Gait Pattern/deviations: Step-to pattern;Step-through pattern;Decreased step length - right;Decreased step length - left;Trunk flexed Gait velocity: decreased   General  Gait Details: used RW this time for increased support, energy conservation and to increase amb distance.  Amb on 2 lts nasal with 50% VC's on purse lip breathing.  Sats on 2 lts decreased from 94% at rest to 84% with activity.     Stairs            Wheelchair Mobility    Modified Rankin (Stroke Patients Only)       Balance                                    Cognition Arousal/Alertness: Awake/alert Behavior During Therapy: WFL for tasks assessed/performed Overall Cognitive Status: Within Functional Limits for tasks assessed                      Exercises      General Comments        Pertinent Vitals/Pain Pain Assessment: No/denies pain    Home Living                      Prior Function            PT Goals (current goals can now be found in the care plan section) Progress towards PT goals: Progressing toward goals    Frequency  Min 3X/week    PT Plan Current plan remains appropriate    Co-evaluation             End of Session Equipment Utilized During Treatment: Gait belt Activity Tolerance: Patient tolerated treatment well Patient left:  in chair;with call bell/phone within reach     Time: 1550-1620 PT Time Calculation (min) (ACUTE ONLY): 30 min  Charges:  $Gait Training: 8-22 mins $Therapeutic Activity: 8-22 mins                    G Codes:      Rica Koyanagi  PTA [facility]  Acute  Rehab Pager      [phone]

## 2014-08-31 NOTE — Progress Notes (Signed)
Inpatient Diabetes Program Recommendations  AACE/ADA: New Consensus Statement on Inpatient Glycemic Control (2013)  Target Ranges:  Prepandial:   less than 140 mg/dL      Peak postprandial:   less than 180 mg/dL (1-2 hours)      Critically ill patients:  140 - 180 mg/dL   Reason for Visit: Hyperglycemia  Diabetes history: DM2 Outpatient Diabetes medications: 70/30 20 units in am and 10 units QHS Current orders for Inpatient glycemic control: Novolog sensitive tidwc  Results for Brittney Cunningham, Brittney Cunningham (MRN [id number]) as of 08/31/2014 09:02  Ref. Range 08/31/2014 07:58  Glucose-Capillary Latest Ref Range: 65-99 mg/dL 302 (H)    On Solumedrol 80 mg Q8H. Needs small amount of basal insulin, along with meal coverage.  Recommendations: Add Lantus 15 units QD Add HS correction Add Novolog 3 units tidwc for meal coverage insulin if pt eats > 50% meal  Will continue to follow. Thank you. Lorenda Peck, RD, LDN, CDE Inpatient Diabetes Coordinator [phone]

## 2014-08-31 NOTE — Progress Notes (Signed)
Name: Brittney Cunningham MRN: [id number] DOB: 05/02/1940    ADMISSION DATE:  08/24/2014 CONSULTATION DATE:  08/27/2014  REFERRING MD :  Dr. Sheran Fava  CHIEF COMPLAINT:  Dyspnea  BRIEF PATIENT DESCRIPTION:  74 yo female with remote hx of smoking admitted with cough, dyspnea, hypoxia, and pulmonary infiltrates initially thought to be HCAP.  She has hx of Stage IV breast cancer dx in 2011 with diffuse bone mets s/p Rt modified radical mastectomy, XRT, letrozole >> changed to fulvestrant until 06/03/14, zoledronic acid >> changed to denosumab on 07/24/14, and exemestane and everolimus started 07/08/14.  SIGNIFICANT EVENTS  7/11 to 7/13  Admit for hypocalcemia, pneumonia 7/25 Readmitted, oncology consulted, DNR status established 7/31 Started solumedrol  STUDIES:  6/07 Echo >> EF 55 to 41%, grade 1 diastolic dysfx 2/87 CT chest >> 5 mm nodule Rt lung base, diffuse sclerotic lesions 7/26 CT chest >> diffuse dense ASD with air bronchograms b/l  SUBJECTIVE:   Denies cough, sputum, or chest pain.  Had trouble sleeping last night and needed sleeping pill.  VITAL SIGNS: Temp:  [97.5 F (36.4 C)-98.7 F (37.1 C)] 97.5 F (36.4 C) (08/01 0500) Pulse Rate:  [83-89] 83 (08/01 0500) Resp:  [20] 20 (07/31 2104) BP: (124-144)/(50-59) 124/59 mmHg (08/01 0500) SpO2:  [90 %-93 %] 90 % (08/01 0500) Weight:  [200 lb 13.4 oz (91.1 kg)] 200 lb 13.4 oz (91.1 kg) (08/01 0500)  PHYSICAL EXAMINATION: General: pleasant Neuro:  Awake, alert,appropriate HEENT: no sinus tenderness Cardiovascular: regular Lungs: b/l crackles more at bases Abdomen: soft, non tender Musculoskeletal: 1+ edema   CMP Latest Ref Rng 08/31/2014 08/30/2014 08/29/2014  Glucose 65 - 99 mg/dL 313(H) 152(H) 143(H)  BUN 6 - 20 mg/dL 33(H) 28(H) 28(H)  Creatinine 0.44 - 1.00 mg/dL 1.57(H) 1.45(H) 1.36(H)  Sodium 135 - 145 mmol/L 133(L) 136 135  Potassium 3.5 - 5.1 mmol/L 3.8 3.8 3.5  Chloride 101 - 111 mmol/L 88(L) 92(L) 92(L)  CO2 22 - 32  mmol/L 32 32 29  Calcium 8.9 - 10.3 mg/dL 7.0(L) 7.0(L) 6.8(L)  Total Protein 6.5 - 8.1 g/dL - - -  Total Bilirubin 0.3 - 1.2 mg/dL - - -  Alkaline Phos 38 - 126 U/L - - -  AST 15 - 41 U/L - - -  ALT 14 - 54 U/L - - -    CBC Latest Ref Rng 08/31/2014 08/30/2014 08/29/2014  WBC 4.0 - 10.5 K/uL 6.4 6.7 6.6  Hemoglobin 12.0 - 15.0 g/dL 7.7(L) 7.8(L) 8.4(L)  Hematocrit 36.0 - 46.0 % 24.0(L) 24.6(L) 26.3(L)  Platelets 150 - 400 K/uL 163 152 153    BNP (last 3 results)  Recent Labs  08/07/14 1158 08/10/14 1715 08/24/14 1042  BNP 343.0* 354.4* 478.7*    Lab Results  Component Value Date   ESRSEDRATE 124* 08/27/2014    Dg Chest Port 1 View  08/30/2014   CLINICAL DATA:  Short of breath. Bilateral breast cancer. Valvular heart disease.  EXAM: PORTABLE CHEST - 1 VIEW  COMPARISON:  Radiographs 08/28/2014  FINDINGS: RIGHT port in place. Stable cardiac silhouette. There is interval increase in bilateral pulmonary airspace disease. Diffuse sclerotic metastasis.  IMPRESSION: Interval increase in bilateral pulmonary airspace disease.  Diffuse sclerotic metastasis.   Electronically Signed   By: Suzy Bouchard M.D.   On: 08/30/2014 10:06    CULTURES: Influenza PCR 7/25 >> negative Legionella total Ab 7/25 >> negative Legionella urine Ag 7/25 >> negative Pneumococcal urine Ag 7/25 >> negative Blood 7/25 >> negative  Respiratory viral panel 7/26 >> negative   ANTIBIOTICS: Ceftazidime 7/25 >> 7/28 Vancomycin 7/25 >> 7/28   DISCUSSION:  Acute hypoxic respiratory failure with b/l pulmonary infiltrates >> most likely from reaction to chemotherapy.  No improvement with diuresis, and nothing to support infection.  ASSESSMENT / PLAN:  Chemotherapy induced pneumonitis ?from everolimus. Plan: - continue solumedrol 80 mg q8h - f/u 2 view CXR 8/02  Acute hypoxic respiratory failure. Plan: - adjust oxygen to keep SpO2 > 90%  Hypervolemia. Hx of HTN, CKD stage III. Plan: - diuresis per  primary team  Stage IV Breast cancer. Plan: - will need to d/w oncology about future options for chemotherapy  Anemic of chronic disease. Plan: - for PRBC transfusion per primary team  Insomnia >> likely from steroids. Plan: - per primary team  Goals of care >> DNR.   Chesley Mires, MD [facility] Pulmonary/Critical Care 08/31/2014, 9:03 AM Pager:  [phone] After 3pm call: [phone]

## 2014-08-31 NOTE — Progress Notes (Signed)
[facility] PROGRESS NOTE  Brittney Cunningham OMB:[id number] DOB: 1940-10-28 DOA: 08/24/2014 PCP: [name], MD  Brief Summary  74 y/o ? 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 There was bone only involvement. Tumor was strongly ER and PR positive, HER-2/neu negative, with an MIP-1 of 26%  She is under the care of Dr. [name] and has rec'd multiple regimens with progression.  She started exemestane and everolimus on 07/08/2014 and was admitted 7/12-7/15 with AKI + dyselectrolytemia + possible bronchitis which was treated with levaquin.  She had progressive SOB and hypoxia and was sent from her PCP's office to the ER on 7/26.   7/25:  Admitted with SOB and hypoxia, started vanc/ceftaz, everolimus discontinued 7/27:  Started diuresis  7/28:  Pulmonology consultation, discontinued antibiotics since cultures neg, afebrile, no leukocytosis, procalc neg 7/30:  Nausea and fatigue despite good diuresis 7/31:  Increasing O2 requirement.  CXR shows worsening infiltrates.  Starting steroids  Assessment/Plan  Acute respiratory failure with hypoxia, due to possible health care associated pneumonia, pulmonary edema from acute diastolic heart failure, lymphangitic spread of stage IV breast Ca, or possible pneumonitis from everolimus.  She has remained afebrile without leukocytosis and her procalcitonin is negative.  O2 down to 2L and feeling better.  BUN and creatinine rising slightly -  CXR 7/31:  Worsening infiltrates -  Respiratory Viral panel neg -  Cryptococcal antigen negative -  Aspergillus neg -  Continue Lasix 60mg  IV TID -   -1.54 L yest -  Wt trending stable -  Grade 1 DD with preserved EF on last ECHO from 06/2013 -  Continue solumedrol 80mg  IV  q8h  Stage IV breast cancer status post mastectomy and chemotherapy - no further aggressive work-up related to her breast cancer - appreciate oncology assistance  Hypocalcemia, improved with IV calcium  Hypokalemia, stable, continue daily potassium while diuresing  Diabetes mellitus type 2, last A1c 7.9 on 06/10/2014, hyperglycemia from steroids - add levemir 10 -  Increase to high dose SSI   CAD with total occlusion of RCA 06/2013 - Continue beta blocker and Imdur  HTN with low normal BPs - Continue to hold CCB  - continue beta blocker  Chronic kidney disease stage III, GFR 37, creatinine 1.2-1.4, stable  Anemia secondary to chronic disease, hgb trending down -  Occult stool positive, but had to strain and may have been contaminated with hemorrhoid bleeding -  Transfuse 1 unit PRBC  Epistaxis, resolving with nasal saline and vaseline and humidified air  Stasis dermatitis, resolved by elevate legs  Diet:  Regular Access:  port IVF:  off Proph: lovenox   Code Status: DNR Family Communication: patient and her husband Disposition Plan:  Possibly to SNF soon once on oral steroids, blood sugars better controlled, and on oral diuretics   Consultants:  Oncology, Dr. [name]  Pulmonology  Procedures:  CT chest 7/26  Antibiotics:  vanc 7/25 > 7/28  ceftaz 7/25 > 7/28  HPI/Subjective:   Nausea improved.  O2 better on 2L [place].  Swelling continues to improve and she can see her ankles now  Objective: Filed Vitals:   08/31/14 0500 08/31/14 1037 08/31/14 1100 08/31/14 1414  BP: 124/59 114/47 113/54 129/55  Pulse: 83 81 84 92  Temp: 97.5 F (36.4 C)  98.1 F (36.7 C) 98.1 F (36.7 C) 98 F (36.7 C)  TempSrc: Oral Oral Oral Oral  Resp:  $Remo'20 20 20  'QpvQY$ Height:      Weight: 91.1 kg (200 lb 13.4 oz)     SpO2: 90% 93% 93% 94%    Intake/Output Summary (Last 24 hours) at 08/31/14 1949 Last data filed at 08/31/14 1414  Gross per 24 hour  Intake    678 ml  Output    1800 ml  Net  -1122 ml   Filed Weights   08/29/14 0516 08/30/14 0535 08/31/14 0500  Weight: 91.627 kg (202 lb) 91.9 kg (202 lb 9.6 oz) 91.1 kg (200 lb 13.4 oz)   Body mass index is 36.72 kg/(m^2).  Exam:   General:  Adult female, No acute distress, smiling   HEENT:  NCAT, MMM  Cardiovascular:  RRR, nl S1, S2 no mrg, 2+ pulses, warm extremities  Respiratory:  Rales at the bilateral bases, no wheezes, no rhonchi, no increased WOB  Abdomen:   NABS, soft, NT/ND  MSK:   Normal tone and bulk, 2+ pitting edema bilateral LEE although calves look smaller than they did a few days ago  Neuro:  Grossly intact  Data Reviewed: Basic Metabolic Panel:  Recent Labs Lab 08/27/14 0420 08/28/14 0504 08/29/14 0515 08/30/14 0545 08/31/14 0430  NA 139 138 135 136 133*  K 3.8 3.6 3.5 3.8 3.8  CL 100* 96* 92* 92* 88*  CO2 $Re'27 28 29 'uyb$ 32 32  GLUCOSE 118* 122* 143* 152* 313*  BUN 25* 26* 28* 28* 33*  CREATININE 1.38* 1.38* 1.36* 1.45* 1.57*  CALCIUM 7.0* 6.8* 6.8* 7.0* 7.0*   Liver Function Tests:  Recent Labs Lab 08/25/14 0525 08/26/14 0550  AST 28 29  ALT 12* 12*  ALKPHOS 74 70  BILITOT 0.4 0.6  PROT 6.9 5.9*  ALBUMIN 2.4* 2.1*   No results for input(s): LIPASE, AMYLASE in the last 168 hours. No results for input(s): AMMONIA in the last 168 hours. CBC:  Recent Labs Lab 08/27/14 0420 08/28/14 0504 08/29/14 0515 08/30/14 0545 08/31/14 0430  WBC 6.5 6.1 6.6 6.7 6.4  NEUTROABS 3.8 3.7 4.0 4.3 4.7  HGB 8.9* 8.5* 8.4* 7.8* 7.7*  HCT 27.6* 26.9* 26.3* 24.6* 24.0*  MCV 87.3 87.6 86.8 87.2 86.6  PLT 178 149* 153 152 163    Recent Results (from the past 240 hour(s))  Culture, blood (routine x 2) Call MD if unable to obtain prior to antibiotics being given     Status: None   Collection Time: 08/24/14  2:00 PM  Result Value Ref Range Status   Specimen Description BLOOD LEFT HAND  Final   Special Requests IN PEDIATRIC BOTTLE 4CC  Final   Culture   Final    NO GROWTH 5  DAYS Performed at [facility]    Report Status 08/29/2014 FINAL  Final  Culture, blood (routine x 2) Call MD if unable to obtain prior to antibiotics being given     Status: None   Collection Time: 08/24/14  2:10 PM  Result Value Ref Range Status   Specimen Description BLOOD LEFT ARM  Final   Special Requests BOTTLES DRAWN AEROBIC AND ANAEROBIC 10CC  Final   Culture   Final    NO GROWTH 5 DAYS Performed at [facility]    Report Status 08/29/2014 FINAL  Final  Respiratory virus panel     Status: None   Collection Time: 08/25/14  7:19 PM  Result  Value Ref Range Status   Source - RVPAN NASAL WASHINGS  Corrected   Respiratory Syncytial Virus A Negative Negative Final   Respiratory Syncytial Virus B Negative Negative Final   Influenza A Negative Negative Final   Influenza B Negative Negative Final   Parainfluenza 1 Negative Negative Final   Parainfluenza 2 Negative Negative Final   Parainfluenza 3 Negative Negative Final   Metapneumovirus Negative Negative Final   Rhinovirus Negative Negative Final   Adenovirus Negative Negative Final    Comment: (NOTE) Performed At: [facility] [address] [place], [place] [id number] Lindon Romp MD NW:[id number]      Studies: Dg Chest Port 1 View  08/30/2014   CLINICAL DATA:  Serigne Kubicek of breath. Bilateral breast cancer. Valvular heart disease.  EXAM: PORTABLE CHEST - 1 VIEW  COMPARISON:  Radiographs 08/28/2014  FINDINGS: RIGHT port in place. Stable cardiac silhouette. There is interval increase in bilateral pulmonary airspace disease. Diffuse sclerotic metastasis.  IMPRESSION: Interval increase in bilateral pulmonary airspace disease.  Diffuse sclerotic metastasis.   Electronically Signed   By: Suzy Bouchard M.D.   On: 08/30/2014 10:06    Scheduled Meds: . amLODipine  10 mg Oral Daily  . aspirin  81 mg Oral Daily  . atorvastatin  80 mg Oral Daily  . calcium carbonate  1 tablet Oral TID WC  . carvedilol  6.25  mg Oral BID WC  . cholecalciferol  1,000 Units Oral Daily  . clopidogrel  75 mg Oral Daily  . enoxaparin (LOVENOX) injection  40 mg Subcutaneous Q24H  . exemestane  25 mg Oral QPC breakfast  . furosemide  60 mg Intravenous TID BM  . gabapentin  100 mg Oral TID  . insulin aspart  0-20 Units Subcutaneous TID WC  . insulin aspart  0-5 Units Subcutaneous QHS  . insulin detemir  10 Units Subcutaneous QHS  . isosorbide mononitrate  60 mg Oral BID  . levothyroxine  137 mcg Oral QAC breakfast  . methylPREDNISolone (SOLU-MEDROL) injection  80 mg Intravenous 3 times per day  . morphine  30 mg Oral BID  . ondansetron  4 mg Oral TID AC  . pantoprazole  40 mg Oral Daily  . potassium chloride  40 mEq Oral Daily  . senna-docusate  1 tablet Oral QHS  . sodium chloride  10-40 mL Intracatheter Q12H   Continuous Infusions:   Principal Problem:   Acute on chronic diastolic heart failure Active Problems:   Diabetes mellitus type 2 in obese   Essential hypertension   Breast cancer metastasized to bone   CAD (coronary artery disease), native coronary artery   Nausea without vomiting   HCAP (healthcare-associated pneumonia)   Malnutrition of moderate degree   Epistaxis   Pneumonitis    Time spent: 30 min    Arianny Pun, [facility] Pager [phone]. If 7PM-7AM, please contact night-coverage at [email or web address], password [facility] 08/31/2014, 7:49 PM  LOS: 7 days

## 2014-08-31 NOTE — Care Management Important Message (Signed)
Important Message  Patient Details  Name: LESLEYANN FICHTER MRN: [id number] Date of Birth: 1941/01/14   Medicare Important Message Given:  Yes-fourth notification given    Shelda Altes 08/31/2014, 3:27 [place] Message  Patient Details  Name: ITZELL BENDAVID MRN: [id number] Date of Birth: [birth year]   Medicare Important Message Given:  Yes-fourth notification given    Shelda Altes 08/31/2014, 3:26 PM

## 2014-08-31 NOTE — Progress Notes (Signed)
Nutrition Follow-up  DOCUMENTATION CODES:   Non-severe (moderate) malnutrition in context of acute illness/injury, Obesity unspecified  INTERVENTION:  - Continue Regular diet and add Carb Mod restriction should CBGs continue to be elevated - RD will continue to monitor for needs  NUTRITION DIAGNOSIS:   Inadequate oral intake related to poor appetite as evidenced by per patient/family report, meal completion < 50%. -resolving  GOAL:   Patient will meet greater than or equal to 90% of their needs -variably met  MONITOR:   PO intake, Weight trends, Labs, I & O's  REASON FOR ASSESSMENT:   Consult Diet education  ASSESSMENT:  74 year old female with past medical history of breast cancer with bone metastasis, anemia of chronic disease, diabetes, hypertension who was recently hospitalized for severe electrolyte abnormalities and was on empiric abx for pneumonia. Patient presented to primary care physician and reported that ever since discharge she thought that her shortness of breath is improving but she had ongoing cough, poor by mouth intake and was feeling very weak.   8/1 - Per chart review, pt ate 50% of all meals 7/30, 75% breakfast and lunch and 90% dinner yesterday, and 100% of breakfast this AM - She states that over the weekend she had 2 episodes of emesis after meals with need for anti-nausea medication and that last night she needed anti-nausea medication but did not have emesis - She states for breakfast she had oatmeal, pancakes, and fruit and did not have nausea or emesis after this meal - Pt reports improving appetite - Seen by DM Coordinator this AM. Pt also reports medical staff has informed her the hyperglycemia is effect of Solu-Medrol order. - Variably meeting needs. Continues to decline supplements. - Medications reviewed. Labs reviewed; CBGs: 132-308 mg/dL, Na: 133 mmol/L, Cl: 88 mmol/L, BUN/creatinine continues to trend up, Ca: 7 mg/dL, GFR: 32.   7/26 - Pt  ate about 50% of breakfast - States appetite "very fair" x2 weeks PTA and need for anti-nausea medication prior to intakes - She indicates that her weight fluctuates from 185-200 lbs. Confirmed by weight hx review. Moderate lower extremity edema noted and pt states this is not usual for her. No muscle or fat wasting noted.   Diet Order:  Diet regular Room service appropriate?: Yes; Fluid consistency:: Thin  Skin:  Reviewed, no issues  Last BM:  7/31  Height:   Ht Readings from Last 1 Encounters:  08/27/14 $RemoveB'5\' 2"'[id number]$  (1.575 m)    Weight:   Wt Readings from Last 1 Encounters:  08/31/14 200 lb 13.4 oz (91.1 kg)    Ideal Body Weight:  51.14 kg (kg)  BMI:  Body mass index is 36.72 kg/(m^2).  Estimated Nutritional Needs:   Kcal:  1350-1550  Protein:  70-80 grams  Fluid:  1.7 L/day  EDUCATION NEEDS:   No education needs identified at this time     Jarome Matin, RD, LDN Inpatient Clinical Dietitian Pager # [phone] After hours/weekend pager # [phone]

## 2014-08-31 NOTE — Progress Notes (Signed)
CSW continuing to follow.   CSW attempted to follow up with pt at bedside to discuss plan for disposition. Pt sleeping soundly at this time and no family present at bedside. CSW provided pt with SNF bed offers on Friday. Per MD, pt not yet medically ready for discharge at this time.  CSW to follow up with pt regarding decision for disposition plan.   CSW to continue to follow to provide support and assist with pt discharge planning needs.   Alison Murray, MSW, [place] [facility] [phone]

## 2014-09-01 ENCOUNTER — Encounter: Payer: Self-pay | Admitting: Nurse Practitioner

## 2014-09-01 ENCOUNTER — Inpatient Hospital Stay (HOSPITAL_COMMUNITY): Payer: Medicare Other

## 2014-09-01 DIAGNOSIS — J9691 Respiratory failure, unspecified with hypoxia: Secondary | ICD-10-CM

## 2014-09-01 LAB — CBC WITH DIFFERENTIAL/PLATELET
Basophils Absolute: 0 10*3/uL (ref 0.0–0.1)
Basophils Relative: 0 % (ref 0–1)
EOS PCT: 0 % (ref 0–5)
Eosinophils Absolute: 0 10*3/uL (ref 0.0–0.7)
HCT: 26.6 % — ABNORMAL LOW (ref 36.0–46.0)
Hemoglobin: 8.6 g/dL — ABNORMAL LOW (ref 12.0–15.0)
LYMPHS PCT: 20 % (ref 12–46)
Lymphs Abs: 1.4 10*3/uL (ref 0.7–4.0)
MCH: 27.9 pg (ref 26.0–34.0)
MCHC: 32.3 g/dL (ref 30.0–36.0)
MCV: 86.4 fL (ref 78.0–100.0)
Monocytes Absolute: 0.5 10*3/uL (ref 0.1–1.0)
Monocytes Relative: 8 % (ref 3–12)
NEUTROS PCT: 72 % (ref 43–77)
Neutro Abs: 5.1 10*3/uL (ref 1.7–7.7)
PLATELETS: 199 10*3/uL (ref 150–400)
RBC: 3.08 MIL/uL — ABNORMAL LOW (ref 3.87–5.11)
RDW: 16.6 % — ABNORMAL HIGH (ref 11.5–15.5)
WBC: 6.9 10*3/uL (ref 4.0–10.5)

## 2014-09-01 LAB — TYPE AND SCREEN
ABO/RH(D): O NEG
Antibody Screen: NEGATIVE
UNIT DIVISION: 0

## 2014-09-01 LAB — BASIC METABOLIC PANEL
Anion gap: 13 (ref 5–15)
BUN: 36 mg/dL — ABNORMAL HIGH (ref 6–20)
CO2: 32 mmol/L (ref 22–32)
Calcium: 7.1 mg/dL — ABNORMAL LOW (ref 8.9–10.3)
Chloride: 88 mmol/L — ABNORMAL LOW (ref 101–111)
Creatinine, Ser: 1.49 mg/dL — ABNORMAL HIGH (ref 0.44–1.00)
GFR calc Af Amer: 40 mL/min — ABNORMAL LOW (ref >60)
GFR, EST NON AFRICAN AMERICAN: 34 mL/min — AB (ref >60)
GLUCOSE: 258 mg/dL — AB (ref 65–99)
POTASSIUM: 3.7 mmol/L (ref 3.5–5.1)
SODIUM: 133 mmol/L — AB (ref 135–145)

## 2014-09-01 LAB — GLUCOSE, CAPILLARY
GLUCOSE-CAPILLARY: 314 mg/dL — AB (ref 65–99)
Glucose-Capillary: 280 mg/dL — ABNORMAL HIGH (ref 65–99)
Glucose-Capillary: 325 mg/dL — ABNORMAL HIGH (ref 65–99)
Glucose-Capillary: 316 mg/dL — ABNORMAL HIGH (ref 65–99)
Glucose-Capillary: 262 mg/dL — ABNORMAL HIGH (ref 65–99)

## 2014-09-01 MED ORDER — CARVEDILOL 3.125 MG PO TABS
3.1250 mg | ORAL_TABLET | Freq: Two times a day (BID) | ORAL | Status: DC
  Administered 2014-09-01 – 2014-09-03 (×4): 3.125 mg via ORAL
  Filled 2014-09-01 (×4): qty 1

## 2014-09-01 MED ORDER — PREDNISONE 20 MG PO TABS
40.0000 mg | ORAL_TABLET | Freq: Every day | ORAL | Status: DC
  Administered 2014-09-01 – 2014-09-03 (×3): 40 mg via ORAL
  Filled 2014-09-01 (×3): qty 2

## 2014-09-01 MED ORDER — METOLAZONE 2.5 MG PO TABS
2.5000 mg | ORAL_TABLET | Freq: Once | ORAL | Status: AC
  Administered 2014-09-01: 2.5 mg via ORAL
  Filled 2014-09-01 (×2): qty 1

## 2014-09-01 MED ORDER — INSULIN DETEMIR 100 UNIT/ML ~~LOC~~ SOLN
20.0000 [IU] | Freq: Every day | SUBCUTANEOUS | Status: DC
  Administered 2014-09-01: 20 [IU] via SUBCUTANEOUS
  Filled 2014-09-01 (×2): qty 0.2

## 2014-09-01 MED ORDER — INSULIN ASPART 100 UNIT/ML ~~LOC~~ SOLN
3.0000 [IU] | Freq: Three times a day (TID) | SUBCUTANEOUS | Status: DC
  Administered 2014-09-01 – 2014-09-02 (×3): 3 [IU] via SUBCUTANEOUS

## 2014-09-01 MED ORDER — ZOLPIDEM TARTRATE 5 MG PO TABS
5.0000 mg | ORAL_TABLET | Freq: Every evening | ORAL | Status: DC | PRN
  Administered 2014-09-01 – 2014-09-02 (×2): 5 mg via ORAL
  Filled 2014-09-01 (×2): qty 1

## 2014-09-01 MED ORDER — PREDNISONE 20 MG PO TABS: 40.0000 mg | ORAL_TABLET | Freq: Every day | ORAL | Status: DC

## 2014-09-01 NOTE — Clinical Social Work Placement (Signed)
   CLINICAL SOCIAL WORK PLACEMENT  NOTE  Date:  09/01/2014  Patient Details  Name: Brittney Cunningham MRN: [id number] Date of Birth: [birth year]  Clinical Social Work is seeking post-discharge placement for this patient at the [place] level of care (*CSW will initial, date and re-position this form in  chart as items are completed):  Yes   Patient/family provided with [place] Work Department's list of facilities offering this level of care within the geographic area requested by the patient (or if unable, by the patient's family).  Yes   Patient/family informed of their freedom to choose among providers that offer the needed level of care, that participate in Medicare, Medicaid or managed care program needed by the patient, have an available bed and are willing to accept the patient.  Yes   Patient/family informed of [facility]'s ownership interest in [facility] and [facility], as well as of the fact that they are under no obligation to receive care at these facilities.  PASRR submitted to EDS on 08/27/14     PASRR number received on 08/27/14     Existing PASRR number confirmed on       FL2 transmitted to all facilities in geographic area requested by pt/family on 08/27/14     FL2 transmitted to all facilities within larger geographic area on       Patient informed that his/her managed care company has contracts with or will negotiate with certain facilities, including the following:        Yes   Patient/family informed of bed offers received.  Patient chooses bed at [facility]     Physician recommends and patient chooses bed at      Patient to be transferred to   on  .  Patient to be transferred to facility by       Patient family notified on   of transfer.  Name of family member notified:        PHYSICIAN Please sign FL2, Please sign DNR     Additional Comment:    _______________________________________________ Ladell Pier, LCSW 09/01/2014, 4:33 PM

## 2014-09-01 NOTE — Progress Notes (Signed)
Inpatient Diabetes Program Recommendations  AACE/ADA: New Consensus Statement on Inpatient Glycemic Control (2013)  Target Ranges:  Prepandial:   less than 140 mg/dL      Peak postprandial:   less than 180 mg/dL (1-2 hours)      Critically ill patients:  140 - 180 mg/dL   Patient's glucose running high in 200's and 300's while on solumedrol steroid therapy. Please consider the following:  Inpatient Diabetes Program Recommendations Insulin - Basal: Home regimen 70/30- 20 units am and 10 units pm-total basal is 21 units basal -Please increase levemir to 20 units Insulin - Meal Coverage: Please add 3 units Novolog meal coveage tidwc Diet: Please change diet to carbohydrate modified   Thank you Rosita Kea, RN, MSN, CDE  Diabetes Inpatient Program Office: [phone] Pager: [phone] 8:00 am to 5:00 pm

## 2014-09-01 NOTE — Progress Notes (Signed)
Per [employer]  Exemestane was shipped fedex  08/31/14 to the patient.

## 2014-09-01 NOTE — Progress Notes (Signed)
CSW continuing to follow.   CSW followed up with pt at bedside regarding disposition planning.   Per MD, pt not yet medically ready for discharge.  CSW discussed with pt her feelings surrounding disposition. Pt reports that she and her family have been discussing plans and pt feels that she will need short term rehab upon discharge. Pt remains hopeful that she may be stronger to go home when she is discharged, but wants to plan for short term rehab as she recognizes that this is likely going to be the most beneficial.   Pt reported that pt and pt family reviewed bed offers and pt interested in [facility] and [facility] as pt son lives very close to the facility. Pt is agreeable to CSW notifying [facility] of pt interest in facility.   CSW contacted [facility] and notified facility of pt interest. [facility] confirmed that facility could accept pt when medically ready for discharge.  CSW to continue to follow to provide support and assist with pt discharge planning needs.   Alison Murray, MSW, [place] [facility] [phone]

## 2014-09-01 NOTE — Progress Notes (Signed)
[facility] PROGRESS NOTE  Brittney Cunningham OJJ:[id number] DOB: [birth year] DOA: 08/24/2014 PCP: [name], MD  Brief Summary  74 y/o ? 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 There was bone only involvement. Tumor was strongly ER and PR positive, HER-2/neu negative, with an MIP-1 of 26%  She is under the care of Dr. [name] and has rec'd multiple regimens with progression.  She started exemestane and everolimus on 07/08/2014 and was admitted 7/12-7/15 with AKI + dyselectrolytemia + possible bronchitis which was treated with levaquin.  She had progressive SOB and hypoxia and was sent from her PCP's office to the ER on 7/26.   7/25:  Admitted with SOB and hypoxia, started vanc/ceftaz, everolimus discontinued 7/27:  Started diuresis  7/28:  Pulmonology consultation, discontinued antibiotics since cultures neg, afebrile, no leukocytosis, procalc neg 7/30:  Nausea and fatigue despite good diuresis 7/31:  Increasing O2 requirement.  CXR shows worsening infiltrates.  Started steroids 8/2:  Improvement.  Steroids tapered  Assessment/Plan  Acute respiratory failure with hypoxia, due to possible health care associated pneumonia, pulmonary edema from acute diastolic heart failure, lymphangitic spread of stage IV breast Ca, or possible pneumonitis from everolimus.  She has remained afebrile without leukocytosis and her procalcitonin is negative.  O2 down to 2L and feeling better.   -  CXR 8/2:  Improvement in infiltrates -  Respiratory Viral panel neg -  Cryptococcal antigen negative -  Aspergillus neg -  Continue Lasix $RemoveBefore'60mg'RWpChWLfOeDYe$  IV TID -  Add metolazone 2.$RemoveBeforeDE'5mg'uwflnnoGJCdsMcP$  po once -   -1.65 L yest -  Wt trending stable -  Grade 1 DD with preserved EF on last ECHO from 06/2013 -   Changed to prednisone $RemoveBefor'40mg'EDPoRInfOmET$  today -  Appreciate pulmonology assistance  Stage IV breast cancer status post mastectomy and chemotherapy - no further aggressive work-up related to her breast cancer - appreciate oncology assistance  Hypocalcemia, improved with IV calcium  Hypokalemia, stable, continue daily potassium while diuresing  Diabetes mellitus type 2, last A1c 7.9 on 06/10/2014, hyperglycemia from steroids -  Increase to leave a mere 20 units -  Continue high dose SSI  -  Add aspart 3 units with meals  CAD with total occlusion of RCA 06/2013, stable.  Life expectancy less than 6 months, therefore not a candidate for statin. - Continue beta blocker and Imdur   HTN with low normal BPs - Continue to hold CCB  - continue beta blocker  Chronic kidney disease stage III, GFR 37, creatinine 1.2-1.4, stable  Anemia secondary to chronic disease, hgb trending down -  Occult stool positive, but had to strain and may have been contaminated with hemorrhoid bleeding -  Transfuse 1 unit PRBC  Epistaxis, resolving with nasal saline and vaseline and humidified air  Stasis dermatitis, resolved by elevate legs  Diet:  Regular Access:  port IVF:  off Proph: lovenox   Code Status: DNR Family Communication: patient and her husband Disposition Plan:  Possibly to SNF tomorrow   Consultants:  Oncology, Dr. [name]  Pulmonology  Procedures:  CT chest 7/26  Antibiotics:  vanc 7/25 > 7/28  ceftaz 7/25 > 7/28  HPI/Subjective:   Nausea improved.  O2 better on 2L [facility].  Continues to feel better over last few days and was able to walk  in halls yesterday.    Objective: Filed Vitals:   08/31/14 1414 08/31/14 2226 09/01/14 0433 09/01/14 1316  BP: 129/55 135/72 136/68 125/45  Pulse: 92 82 78 76  Temp: 98 F (36.7 C) 97.5 F (36.4 C) 97.9 F (36.6 C) 97.5 F (36.4 C)  TempSrc: Oral Oral Oral Oral  Resp: $Remo'20 20 18 16  'kvqFF$ Height:      Weight:   92.9 kg (204 lb 12.9 oz)   SpO2: 94%  96% 95% 98%    Intake/Output Summary (Last 24 hours) at 09/01/14 1520 Last data filed at 09/01/14 1300  Gross per 24 hour  Intake    720 ml  Output   2050 ml  Net  -1330 ml   Filed Weights   08/30/14 0535 08/31/14 0500 09/01/14 0433  Weight: 91.9 kg (202 lb 9.6 oz) 91.1 kg (200 lb 13.4 oz) 92.9 kg (204 lb 12.9 oz)   Body mass index is 37.45 kg/(m^2).  Exam:   General:  Adult female, No acute distress  HEENT:  NCAT, MMM  Cardiovascular:  RRR, nl S1, S2 no mrg, 2+ pulses, warm extremities  Respiratory:  Rales at the bilateral bases, no wheezes, no rhonchi, no increased WOB  Abdomen:   NABS, soft, NT/ND  MSK:   Normal tone and bulk, 2+ pitting edema bilateral LEE although ankles look more swollen today  Neuro:  Grossly intact  Data Reviewed: Basic Metabolic Panel:  Recent Labs Lab 08/28/14 0504 08/29/14 0515 08/30/14 0545 08/31/14 0430 09/01/14 0352  NA 138 135 136 133* 133*  K 3.6 3.5 3.8 3.8 3.7  CL 96* 92* 92* 88* 88*  CO2 28 29 32 32 32  GLUCOSE 122* 143* 152* 313* 258*  BUN 26* 28* 28* 33* 36*  CREATININE 1.38* 1.36* 1.45* 1.57* 1.49*  CALCIUM 6.8* 6.8* 7.0* 7.0* 7.1*   Liver Function Tests:  Recent Labs Lab 08/26/14 0550  AST 29  ALT 12*  ALKPHOS 70  BILITOT 0.6  PROT 5.9*  ALBUMIN 2.1*   No results for input(s): LIPASE, AMYLASE in the last 168 hours. No results for input(s): AMMONIA in the last 168 hours. CBC:  Recent Labs Lab 08/28/14 0504 08/29/14 0515 08/30/14 0545 08/31/14 0430 09/01/14 0352  WBC 6.1 6.6 6.7 6.4 6.9  NEUTROABS 3.7 4.0 4.3 4.7 5.1  HGB 8.5* 8.4* 7.8* 7.7* 8.6*  HCT 26.9* 26.3* 24.6* 24.0* 26.6*  MCV 87.6 86.8 87.2 86.6 86.4  PLT 149* 153 152 163 199    Recent Results (from the past 240 hour(s))  Culture, blood (routine x 2) Call MD if unable to obtain prior to antibiotics being given     Status: None   Collection Time: 08/24/14  2:00 PM  Result Value Ref Range Status   Specimen Description BLOOD LEFT HAND   Final   Special Requests IN PEDIATRIC BOTTLE 4CC  Final   Culture   Final    NO GROWTH 5 DAYS Performed at [facility]    Report Status 08/29/2014 FINAL  Final  Culture, blood (routine x 2) Call MD if unable to obtain prior to antibiotics being given     Status: None   Collection Time: 08/24/14  2:10 PM  Result Value Ref Range Status   Specimen Description BLOOD LEFT ARM  Final   Special Requests BOTTLES DRAWN AEROBIC AND ANAEROBIC 10CC  Final   Culture   Final    NO GROWTH 5 DAYS Performed at [facility]    Report Status  08/29/2014 FINAL  Final  Respiratory virus panel     Status: None   Collection Time: 08/25/14  7:19 PM  Result Value Ref Range Status   Source - RVPAN NASAL WASHINGS  Corrected   Respiratory Syncytial Virus A Negative Negative Final   Respiratory Syncytial Virus B Negative Negative Final   Influenza A Negative Negative Final   Influenza B Negative Negative Final   Parainfluenza 1 Negative Negative Final   Parainfluenza 2 Negative Negative Final   Parainfluenza 3 Negative Negative Final   Metapneumovirus Negative Negative Final   Rhinovirus Negative Negative Final   Adenovirus Negative Negative Final    Comment: (NOTE) Performed At: [facility] [place], [place] [id number] Lindon Romp MD IF:[id number]      Studies: Dg Chest 2 View  09/01/2014   CLINICAL DATA:  Shortness of breath and weakness, followup pneumonitis  EXAM: CHEST  2 VIEW  COMPARISON:  08/30/2014  FINDINGS: Improvement in diffuse patchy pulmonary airspace opacities bilaterally. Trace pleural effusions persist. Moderate enlargement of the cardiac silhouette is again noted. Right-sided Port-A-Cath in place with tip over the cavoatrial junction.  IMPRESSION: Improving diffuse pulmonary airspace opacities which could reflect edema or pneumonitis or other alveolar filling processes.   Electronically Signed   By: Conchita Paris M.D.   On: 09/01/2014 09:32     Scheduled Meds: . amLODipine  10 mg Oral Daily  . aspirin  81 mg Oral Daily  . atorvastatin  80 mg Oral Daily  . calcium carbonate  1 tablet Oral TID WC  . carvedilol  6.25 mg Oral BID WC  . cholecalciferol  1,000 Units Oral Daily  . clopidogrel  75 mg Oral Daily  . enoxaparin (LOVENOX) injection  40 mg Subcutaneous Q24H  . exemestane  25 mg Oral QPC breakfast  . furosemide  60 mg Intravenous TID BM  . gabapentin  100 mg Oral TID  . insulin aspart  0-20 Units Subcutaneous TID WC  . insulin aspart  0-5 Units Subcutaneous QHS  . insulin aspart  3 Units Subcutaneous TID WC  . insulin detemir  10 Units Subcutaneous QHS  . isosorbide mononitrate  60 mg Oral BID  . levothyroxine  137 mcg Oral QAC breakfast  . morphine  30 mg Oral BID  . ondansetron  4 mg Oral TID AC  . pantoprazole  40 mg Oral Daily  . potassium chloride  40 mEq Oral Daily  . predniSONE  40 mg Oral Q breakfast  . senna-docusate  1 tablet Oral QHS  . sodium chloride  10-40 mL Intracatheter Q12H   Continuous Infusions:   Principal Problem:   Acute on chronic diastolic heart failure Active Problems:   Diabetes mellitus type 2 in obese   Essential hypertension   Breast cancer metastasized to bone   CAD (coronary artery disease), native coronary artery   Nausea without vomiting   HCAP (healthcare-associated pneumonia)   Malnutrition of moderate degree   Epistaxis   Pneumonitis    Time spent: 30 min    Gearldene Fiorenza, [facility] Pager [phone]. If 7PM-7AM, please contact night-coverage at [email or web address], password [facility] 09/01/2014, 3:20 PM  LOS: 8 days

## 2014-09-01 NOTE — Progress Notes (Signed)
Name: Brittney Cunningham MRN: [id number] DOB: [birth year]    ADMISSION DATE:  08/24/2014 CONSULTATION DATE:  08/27/2014  REFERRING MD :  Dr. Sheran Fava  CHIEF COMPLAINT:  Dyspnea  BRIEF PATIENT DESCRIPTION:  74 yo female with remote hx of smoking admitted with cough, dyspnea, hypoxia, and pulmonary infiltrates initially thought to be HCAP.  She has hx of Stage IV breast cancer dx in 2011 with diffuse bone mets s/p Rt modified radical mastectomy, XRT, letrozole >> changed to fulvestrant until 06/03/14, zoledronic acid >> changed to denosumab on 07/24/14, and exemestane and everolimus started 07/08/14.  SIGNIFICANT EVENTS  7/11 to 7/13  Admit for hypocalcemia, pneumonia 7/25 Readmitted, oncology consulted, DNR status established 7/31 Started solumedrol 8/02 Change to prednisone  STUDIES:  6/07 Echo >> EF 55 to 88%, grade 1 diastolic dysfx 5/02 CT chest >> 5 mm nodule Rt lung base, diffuse sclerotic lesions 7/26 CT chest >> diffuse dense ASD with air bronchograms b/l  SUBJECTIVE:   Feeling much better.  Able to walk in hall more with minimal dyspnea.  Denies cough or sputum.  VITAL SIGNS: Temp:  [97.5 F (36.4 C)-98 F (36.7 C)] 97.9 F (36.6 C) (08/02 0433) Pulse Rate:  [78-92] 78 (08/02 0433) Resp:  [18-20] 18 (08/02 0433) BP: (129-136)/(55-72) 136/68 mmHg (08/02 0433) SpO2:  [94 %-96 %] 95 % (08/02 0433) Weight:  [204 lb 12.9 oz (92.9 kg)] 204 lb 12.9 oz (92.9 kg) (08/02 0433)  PHYSICAL EXAMINATION: General: pleasant Neuro:  Awake, alert,appropriate HEENT: no sinus tenderness Cardiovascular: regular Lungs: no wheeze Abdomen: soft, non tender Musculoskeletal: 1+ edema   CMP Latest Ref Rng 09/01/2014 08/31/2014 08/30/2014  Glucose 65 - 99 mg/dL 258(H) 313(H) 152(H)  BUN 6 - 20 mg/dL 36(H) 33(H) 28(H)  Creatinine 0.44 - 1.00 mg/dL 1.49(H) 1.57(H) 1.45(H)  Sodium 135 - 145 mmol/L 133(L) 133(L) 136  Potassium 3.5 - 5.1 mmol/L 3.7 3.8 3.8  Chloride 101 - 111 mmol/L 88(L) 88(L) 92(L)    CO2 22 - 32 mmol/L 32 32 32  Calcium 8.9 - 10.3 mg/dL 7.1(L) 7.0(L) 7.0(L)  Total Protein 6.5 - 8.1 g/dL - - -  Total Bilirubin 0.3 - 1.2 mg/dL - - -  Alkaline Phos 38 - 126 U/L - - -  AST 15 - 41 U/L - - -  ALT 14 - 54 U/L - - -    CBC Latest Ref Rng 09/01/2014 08/31/2014 08/30/2014  WBC 4.0 - 10.5 K/uL 6.9 6.4 6.7  Hemoglobin 12.0 - 15.0 g/dL 8.6(L) 7.7(L) 7.8(L)  Hematocrit 36.0 - 46.0 % 26.6(L) 24.0(L) 24.6(L)  Platelets 150 - 400 K/uL 199 163 152    BNP (last 3 results)  Recent Labs  08/07/14 1158 08/10/14 1715 08/24/14 1042  BNP 343.0* 354.4* 478.7*    Lab Results  Component Value Date   ESRSEDRATE 124* 08/27/2014    Dg Chest 2 View  09/01/2014   CLINICAL DATA:  Shortness of breath and weakness, followup pneumonitis  EXAM: CHEST  2 VIEW  COMPARISON:  08/30/2014  FINDINGS: Improvement in diffuse patchy pulmonary airspace opacities bilaterally. Trace pleural effusions persist. Moderate enlargement of the cardiac silhouette is again noted. Right-sided Port-A-Cath in place with tip over the cavoatrial junction.  IMPRESSION: Improving diffuse pulmonary airspace opacities which could reflect edema or pneumonitis or other alveolar filling processes.   Electronically Signed   By: Conchita Paris M.D.   On: 09/01/2014 09:32    CULTURES: Influenza PCR 7/25 >> negative Legionella total Ab 7/25 >> negative  Legionella urine Ag 7/25 >> negative Pneumococcal urine Ag 7/25 >> negative Blood 7/25 >> negative Respiratory viral panel 7/26 >> negative   ANTIBIOTICS: Ceftazidime 7/25 >> 7/28 Vancomycin 7/25 >> 7/28   DISCUSSION:  Acute hypoxic respiratory failure with b/l pulmonary infiltrates >> most likely from reaction to chemotherapy.  No improvement with diuresis, and nothing to support infection.  Clinical status and xray improving since starting steroids.  ASSESSMENT / PLAN:  Chemotherapy induced pneumonitis ?from everolimus. Plan: - change to prednisone 40 mg daily on  8/02 - f/u CXR intermittently  Acute hypoxic respiratory failure. Plan: - adjust oxygen to keep SpO2 > 90%  Hypervolemia. Hx of HTN, CKD stage III. Plan: - diuresis per primary team  Stage IV Breast cancer. Plan: - will need to d/w oncology about future options for chemotherapy  Goals of care >> DNR.  Updated pt's husband at bedside.  Chesley Mires, MD [facility] Pulmonary/Critical Care 09/01/2014, 11:38 AM Pager:  [phone] After 3pm call: [phone]

## 2014-09-01 NOTE — Progress Notes (Signed)
Brittney Cunningham   DOB:06/16/1940   WK#:[id number]   XYV#:[id number]  Subjective: Brittney Cunningham is sitting at bedside, completing breakfast. She is very encouraged with developments past 2 days and feels her breaathing is already better. She is of course on steroids and she is aware this is kicking up her blood sugars and causing her appetite to increase, also keeping her from sleeping. These problems are being actively addressed. --Minimal cough, no phlegm, no pleurisy. Able to walk about 30 feet before becoming hypoxic. Using flutter valve and IS regularly. -- no family in room  Objective: elderly White woman examined at [place] Filed Vitals:   09/01/14 0433  BP: 136/68  Pulse: 78  Temp: 97.9 F (36.6 C)  Resp: 18    Body mass index is 37.45 kg/(m^2).  Intake/Output Summary (Last 24 hours) at 09/01/14 0801 Last data filed at 09/01/14 0441  Gross per 24 hour  Intake    560 ml  Output   2450 ml  Net  -1890 ml     CBG (last 3)   Recent Labs  08/31/14 1711 08/31/14 2221 09/01/14 0735  GLUCAP 333* 280* 314*     Labs:  Lab Results  Component Value Date   WBC 6.9 09/01/2014   HGB 8.6* 09/01/2014   HCT 26.6* 09/01/2014   MCV 86.4 09/01/2014   PLT 199 09/01/2014   NEUTROABS 5.1 09/01/2014    '@LASTCHEMISTRY'$ @  Urine Studies No results for input(s): UHGB, CRYS in the last 72 hours.  Invalid input(s): UACOL, UAPR, USPG, UPH, UTP, UGL, UKET, UBIL, UNIT, UROB, [place], UEPI, UWBC, Duwayne Heck [place], [place]  Basic Metabolic Panel:  Recent Labs Lab 08/28/14 0504 08/29/14 0515 08/30/14 0545 08/31/14 0430 09/01/14 0352  NA 138 135 136 133* 133*  K 3.6 3.5 3.8 3.8 3.7  CL 96* 92* 92* 88* 88*  CO2 28 29 32 32 32  GLUCOSE 122* 143* 152* 313* 258*  BUN 26* 28* 28* 33* 36*  CREATININE 1.38* 1.36* 1.45* 1.57* 1.49*  CALCIUM 6.8* 6.8* 7.0* 7.0* 7.1*   GFR Estimated Creatinine Clearance: 36.7 mL/min (by C-G formula based on Cr of 1.49). Liver Function Tests:  Recent Labs Lab  08/26/14 0550  AST 29  ALT 12*  ALKPHOS 70  BILITOT 0.6  PROT 5.9*  ALBUMIN 2.1*   No results for input(s): LIPASE, AMYLASE in the last 168 hours. No results for input(s): AMMONIA in the last 168 hours. Coagulation profile  Recent Labs Lab 08/26/14 0550  INR 1.24    CBC:  Recent Labs Lab 08/28/14 0504 08/29/14 0515 08/30/14 0545 08/31/14 0430 09/01/14 0352  WBC 6.1 6.6 6.7 6.4 6.9  NEUTROABS 3.7 4.0 4.3 4.7 5.1  HGB 8.5* 8.4* 7.8* 7.7* 8.6*  HCT 26.9* 26.3* 24.6* 24.0* 26.6*  MCV 87.6 86.8 87.2 86.6 86.4  PLT 149* 153 152 163 199   Cardiac Enzymes: No results for input(s): CKTOTAL, CKMB, CKMBINDEX, TROPONINI in the last 168 hours. BNP: Invalid input(s): POCBNP CBG:  Recent Labs Lab 08/31/14 0758 08/31/14 1158 08/31/14 1711 08/31/14 2221 09/01/14 0735  GLUCAP 302* 403* 333* 280* 314*   D-Dimer No results for input(s): DDIMER in the last 72 hours. Hgb A1c No results for input(s): HGBA1C in the last 72 hours. Lipid Profile No results for input(s): CHOL, HDL, LDLCALC, TRIG, CHOLHDL, LDLDIRECT in the last 72 hours. Thyroid function studies No results for input(s): TSH, T4TOTAL, T3FREE, THYROIDAB in the last 72 hours.  Invalid input(s): FREET3 Anemia work up No results for  input(s): VITAMINB12, FOLATE, FERRITIN, TIBC, IRON, RETICCTPCT in the last 72 hours. Microbiology Recent Results (from the past 240 hour(s))  Culture, blood (routine x 2) Call Cunningham if unable to obtain prior to antibiotics being given     Status: None   Collection Time: 08/24/14  2:00 PM  Result Value Ref Range Status   Specimen Description BLOOD LEFT HAND  Final   Special Requests IN PEDIATRIC BOTTLE 4CC  Final   Culture   Final    NO GROWTH 5 DAYS Performed at [facility]    Report Status 08/29/2014 FINAL  Final  Culture, blood (routine x 2) Call Cunningham if unable to obtain prior to antibiotics being given     Status: None   Collection Time: 08/24/14  2:10 PM  Result Value Ref  Range Status   Specimen Description BLOOD LEFT ARM  Final   Special Requests BOTTLES DRAWN AEROBIC AND ANAEROBIC 10CC  Final   Culture   Final    NO GROWTH 5 DAYS Performed at [facility]    Report Status 08/29/2014 FINAL  Final  Respiratory virus panel     Status: None   Collection Time: 08/25/14  7:19 PM  Result Value Ref Range Status   Source - RVPAN NASAL WASHINGS  Corrected   Respiratory Syncytial Virus A Negative Negative Final   Respiratory Syncytial Virus B Negative Negative Final   Influenza A Negative Negative Final   Influenza B Negative Negative Final   Parainfluenza 1 Negative Negative Final   Parainfluenza 2 Negative Negative Final   Parainfluenza 3 Negative Negative Final   Metapneumovirus Negative Negative Final   Rhinovirus Negative Negative Final   Adenovirus Negative Negative Final    Comment: (NOTE) Performed At: [facility] [place], [place] [id number] Brittney Cunningham FX:[id number]       Studies:  Dg Chest Port 1 View  08/30/2014   CLINICAL DATA:  Short of breath. Bilateral breast cancer. Valvular heart disease.  EXAM: PORTABLE CHEST - 1 VIEW  COMPARISON:  Radiographs 08/28/2014  FINDINGS: RIGHT port in place. Stable cardiac silhouette. There is interval increase in bilateral pulmonary airspace disease. Diffuse sclerotic metastasis.  IMPRESSION: Interval increase in bilateral pulmonary airspace disease.  Diffuse sclerotic metastasis.   Electronically Signed   By: Suzy Bouchard M.D.   On: 08/30/2014 10:06    Assessment: 74 y.o. 74 y.o. [place] woman:   (1) Status post right breast biopsy in 05/2009 for a grade 2 invasive ductal carcinoma, T2 NX M1, Stage IV, with bone-only involvement, strongly estrogen and progesterone receptor-positive, HER-2-negative with an MIB-1 of 26%.  (2) Neoadjuvantly she received Letrozole and zoledronic acid beginning in June of 2011 and underwent right modified radical mastectomy  February of 2012 for a ypT2 ypN2, grade 1 invasive ductal carcinoma with negative margins.   (3) She completed radiation therapy in August of 2012.   (4) She has continued on Letrozole but was switched from Zoledronic acid to Denosumab Delton See) because of concerns regarding her serum creatinine. Delton See was being given every 8 weeks.  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  (6) symptomatic anemia, with creatinine clearance < 60 cc/min; Darbepoietin Q14d started 11/03/2011; received Feraheme 01/05/2012  (7) On subcutaneous B-12 supplementation monthly.  (8) Pain in left upper extremity with osseous metastasis, osteoarthritis, tendinopathy, and bursitis as confirmed by recent MRI.  (  9) Anemia, multifactorial with renal disease, on Aranesp monthly, increased to every 2 weeks June 2016  (10) letrozole was discontinued in September 2014 with evidence of disease progression. She was started on fulvestrant injections, first given on 10/17/2012; stopped 06/03/2014 with progression  (11) exemestane and everolimus started 07/08/2014  (a) everolimus stopped July 2016 due to concern re. Pneumonitis  (12) bilateral pulmonary infiltrates, possibly due to everolimus  (a) not responding to antibiotics, not associated with signs/ symptoms of infection  (b) solumedrol started 08/30/2014   Plan:  Lashena is able to walk a bit further before she experiences hypoxia. She is very motivated and doing her flutter valve an IS excercises. CXR today pending.  Hopefully we can demonstrate impropvement w steroids and off everolimus/Affinitor. It would be difficult for her to go home currently (sugars poorly controlled, really needs 24/7 care) but that may become possible with continuing support. Not clear if a 2-4 week stay in a SNF once she is more stable would be helpful.  I am going to see  her late this month. In the meantime we will continue exemestane, which is not associated with pulmonary problems. If she shows marked improvement by then, as we all hope, we will address further treatment options for her stage IV breast cancer.  Appreciate your help to Ms Hellickson!   Chauncey Cruel, Cunningham 09/01/2014  8:01 AM Medical Oncology and Hematology [facility] [address] [place], [place] [address] Tel. [phone]    Fax. [phone]

## 2014-09-01 NOTE — Progress Notes (Signed)
Physical Therapy Treatment Patient Details Name: Brittney Cunningham MRN: [id number] DOB: December 28, 1940 Today's Date: 09/01/2014    History of Present Illness 74 year old female with past medical history of breast cancer with bone metastasis, anemia of chronic disease, diabetes, hypertension who was recently hospitalized for severe electrolyte abnormalities and was on empiric abx for pneumonia admitted with Acute respiratory failure with hypoxia / healthcare associated pneumonia    PT Comments    Pt ambulated 48' x 2 with RW, SaO2 85% on 2L, 91% on 3L. Distance limited by fatigue. Overall increase in gait distance today.  Follow Up Recommendations  SNF;Supervision/Assistance - 24 hour     Equipment Recommendations  None recommended by PT    Recommendations for Other Services       Precautions / Restrictions Precautions Precautions: Fall Precaution Comments: monitor sats Restrictions Weight Bearing Restrictions: No    Mobility  Bed Mobility Overal bed mobility: Modified Independent             General bed mobility comments: increased time and use of rail  Transfers Overall transfer level: Needs assistance Equipment used: Rolling walker (2 wheeled)   Sit to Stand: Supervision         General transfer comment: good use of hands to steady self and good safety cognition  Ambulation/Gait   Ambulation Distance (Feet): 150 Feet Assistive device: Rolling walker (2 wheeled) (youth) Gait Pattern/deviations: Step-through pattern Gait velocity: decreased   General Gait Details: 33' x 2 with seated rest, 85% SaO2 on 2L O2, 91% on 3L O2 [place], no LOB   Stairs            Wheelchair Mobility    Modified Rankin (Stroke Patients Only)       Balance             Standing balance-Leahy Scale: Fair                      Cognition Arousal/Alertness: Awake/alert Behavior During Therapy: WFL for tasks assessed/performed Overall Cognitive Status: Within  Functional Limits for tasks assessed                      Exercises      General Comments        Pertinent Vitals/Pain Pain Assessment: No/denies pain    Home Living                      Prior Function            PT Goals (current goals can now be found in the care plan section) Acute Rehab PT Goals Patient Stated Goal: to feel better. regain strength, play with grandkids PT Goal Formulation: With patient Time For Goal Achievement: 09/08/14 Potential to Achieve Goals: Good Progress towards PT goals: Progressing toward goals    Frequency  Min 3X/week    PT Plan Current plan remains appropriate    Co-evaluation             End of Session Equipment Utilized During Treatment: Gait belt;Oxygen Activity Tolerance: Patient tolerated treatment well Patient left: with call bell/phone within reach;in bed     Time: 1436-1500 PT Time Calculation (min) (ACUTE ONLY): 24 min  Charges:  $Gait Training: 8-22 mins $Therapeutic Activity: 8-22 mins                    G Codes:      Philomena Doheny 09/01/2014, 3:08  PM [phone]

## 2014-09-02 ENCOUNTER — Other Ambulatory Visit: Payer: Self-pay | Admitting: Oncology

## 2014-09-02 DIAGNOSIS — J189 Pneumonia, unspecified organism: Secondary | ICD-10-CM

## 2014-09-02 DIAGNOSIS — I5033 Acute on chronic diastolic (congestive) heart failure: Principal | ICD-10-CM

## 2014-09-02 DIAGNOSIS — E669 Obesity, unspecified: Secondary | ICD-10-CM

## 2014-09-02 DIAGNOSIS — E119 Type 2 diabetes mellitus without complications: Secondary | ICD-10-CM

## 2014-09-02 LAB — CBC WITH DIFFERENTIAL/PLATELET
BASOS ABS: 0 10*3/uL (ref 0.0–0.1)
BASOS PCT: 0 % (ref 0–1)
EOS ABS: 0 10*3/uL (ref 0.0–0.7)
Eosinophils Relative: 0 % (ref 0–5)
HCT: 26.8 % — ABNORMAL LOW (ref 36.0–46.0)
HEMOGLOBIN: 8.7 g/dL — AB (ref 12.0–15.0)
LYMPHS PCT: 16 % (ref 12–46)
Lymphs Abs: 1.2 10*3/uL (ref 0.7–4.0)
MCH: 28.1 pg (ref 26.0–34.0)
MCHC: 32.5 g/dL (ref 30.0–36.0)
MCV: 86.5 fL (ref 78.0–100.0)
MONO ABS: 0.7 10*3/uL (ref 0.1–1.0)
Monocytes Relative: 9 % (ref 3–12)
Neutro Abs: 5.9 10*3/uL (ref 1.7–7.7)
Neutrophils Relative %: 75 % (ref 43–77)
Platelets: 240 10*3/uL (ref 150–400)
RBC: 3.1 MIL/uL — ABNORMAL LOW (ref 3.87–5.11)
RDW: 16.4 % — ABNORMAL HIGH (ref 11.5–15.5)
WBC: 7.8 10*3/uL (ref 4.0–10.5)

## 2014-09-02 LAB — BASIC METABOLIC PANEL
ANION GAP: 12 (ref 5–15)
BUN: 43 mg/dL — AB (ref 6–20)
CALCIUM: 7.3 mg/dL — AB (ref 8.9–10.3)
CO2: 36 mmol/L — ABNORMAL HIGH (ref 22–32)
Chloride: 87 mmol/L — ABNORMAL LOW (ref 101–111)
Creatinine, Ser: 1.71 mg/dL — ABNORMAL HIGH (ref 0.44–1.00)
GFR calc Af Amer: 34 mL/min — ABNORMAL LOW (ref >60)
GFR, EST NON AFRICAN AMERICAN: 29 mL/min — AB (ref >60)
Glucose, Bld: 218 mg/dL — ABNORMAL HIGH (ref 65–99)
POTASSIUM: 3.4 mmol/L — AB (ref 3.5–5.1)
SODIUM: 135 mmol/L (ref 135–145)

## 2014-09-02 LAB — GLUCOSE, CAPILLARY
GLUCOSE-CAPILLARY: 319 mg/dL — AB (ref 65–99)
GLUCOSE-CAPILLARY: 284 mg/dL — AB (ref 65–99)
GLUCOSE-CAPILLARY: 253 mg/dL — AB (ref 65–99)
Glucose-Capillary: 182 mg/dL — ABNORMAL HIGH (ref 65–99)

## 2014-09-02 MED ORDER — INSULIN LISPRO 100 UNIT/ML ~~LOC~~ SOLN: 15.0000 [IU] | Freq: Three times a day (TID) | SUBCUTANEOUS | Status: DC

## 2014-09-02 MED ORDER — INSULIN DETEMIR 100 UNIT/ML ~~LOC~~ SOLN
25.0000 [IU] | Freq: Every day | SUBCUTANEOUS | Status: DC
  Administered 2014-09-02: 25 [IU] via SUBCUTANEOUS
  Filled 2014-09-02: qty 0.25

## 2014-09-02 MED ORDER — ONDANSETRON HCL 4 MG PO TABS: 4.0000 mg | ORAL_TABLET | Freq: Three times a day (TID) | ORAL | Status: DC | PRN

## 2014-09-02 MED ORDER — PREDNISONE 20 MG PO TABS: 40.0000 mg | ORAL_TABLET | Freq: Every day | ORAL | Status: DC

## 2014-09-02 MED ORDER — INSULIN ASPART 100 UNIT/ML ~~LOC~~ SOLN
5.0000 [IU] | Freq: Three times a day (TID) | SUBCUTANEOUS | Status: DC
  Administered 2014-09-02: 5 [IU] via SUBCUTANEOUS

## 2014-09-02 MED ORDER — INSULIN DETEMIR 100 UNIT/ML ~~LOC~~ SOLN: 25.0000 [IU] | Freq: Every day | SUBCUTANEOUS | Status: DC

## 2014-09-02 MED ORDER — MORPHINE SULFATE ER 30 MG PO TBCR: 30.0000 mg | EXTENDED_RELEASE_TABLET | Freq: Two times a day (BID) | ORAL | Status: DC

## 2014-09-02 MED ORDER — PANTOPRAZOLE SODIUM 20 MG PO TBEC
20.0000 mg | DELAYED_RELEASE_TABLET | Freq: Every day | ORAL | Status: DC
  Administered 2014-09-03: 20 mg via ORAL
  Filled 2014-09-02 (×2): qty 1

## 2014-09-02 MED ORDER — FUROSEMIDE 80 MG PO TABS: 80.0000 mg | ORAL_TABLET | Freq: Two times a day (BID) | ORAL | Status: DC

## 2014-09-02 MED ORDER — POTASSIUM CHLORIDE CRYS ER 20 MEQ PO TBCR: 40.0000 meq | EXTENDED_RELEASE_TABLET | Freq: Every day | ORAL | Status: DC

## 2014-09-02 MED ORDER — FUROSEMIDE 40 MG PO TABS
80.0000 mg | ORAL_TABLET | Freq: Two times a day (BID) | ORAL | Status: DC
  Administered 2014-09-03: 80 mg via ORAL
  Filled 2014-09-02: qty 2

## 2014-09-02 NOTE — Discharge Summary (Addendum)
Physician Discharge Summary  Brittney Cunningham QBH:[id number] DOB: July 15, 1940 DOA: 08/24/2014  PCP: [name], MD  Admit date: 08/24/2014 Discharge date: 09/03/2014  Addendum to Dr Rise Mu discharge summary  Recommendations for Outpatient Follow-up:  1. Transfer to skilled nursing facility for ongoing physical and occupational therapy 2. Wean oxygen as tolerated 3. Repeat BMP and CBC in approximately 2 weeks to follow-up creatinine and anemia 4. Continue prednisone 40 mg daily until she is tapered off oxygen, then taper prednisone by 10 mg per week until it is off 5. Please adjust insulin as prednisone is tapered 6. F/u with pulmonology, and PCP. Has appointment with Dr [name] on 09/21/2014 at 8:45 am.  Discharge Diagnoses:  Principal Problem:   Pneumonitis   Active Problems:   Diabetes mellitus type 2 in obese   Essential hypertension   Breast cancer metastasized to bone   CAD (coronary artery disease), native coronary artery   Nausea without vomiting   HCAP (healthcare-associated pneumonia)   Malnutrition of moderate degree   Acute on chronic diastolic heart failure   Epistaxis   Discharge Condition: Stable, improved  Diet recommendation: Diabetic  Wt Readings from Last 3 Encounters:  09/02/14 93 kg (205 lb 0.4 oz)  08/24/14 90.266 kg (199 lb)  08/10/14 86.183 kg (190 lb)    History of present illness:  74 y/o ? 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 There was bone only involvement. Tumor was strongly ER and PR positive, HER-2/neu negative, with an MIP-1 of 26% She is under the care of Dr. [name] and has rec'd multiple regimens with progression. She started exemestane and everolimus on 07/08/2014 and was admitted 7/12-7/15 with AKI +  dyselectrolytemia + possible bronchitis which was treated with levaquin. She had progressive SOB and hypoxia and was sent from her PCP's office to the ER on 7/26.   7/25: Admitted with SOB and hypoxia, started vanc/ceftaz, everolimus discontinued 7/27: Started diuresis  7/28: Pulmonology consultation, discontinued antibiotics since cultures neg, afebrile, no leukocytosis, procalc neg 7/30: Nausea and fatigue despite good diuresis 7/31: Increasing O2 requirement. CXR shows worsening infiltrates. Started steroids 8/2: Improvement. Steroids tapered  Hospital Course:   Acute respiratory failure with hypoxia due to acute diastolic heart failure, possible lymphangitic spread of stage IV breast Ca, and pneumonitis from everolimus. She was started on broad-spectrum antibiotics however she remained afebrile, without leukocytosis, and her procalcitonin was negative. After few days, her antibiotics were discontinued. Her cultures remained negative.  Respiratory viral panel was negative. The cryptococcal antigen and Aspergillus tests were negative.  She was started on Lasix 60 mg IV 3 times a day and diuresed approximately 1-1/2-2 L per day, however she remained following overloaded with severe lower extremity edema and rales on exam. Despite diuresis, she ended up with an increased oxygen and with decreased oxygen saturations on 7/31. Repeat chest x-ray demonstrated worsening infiltrates. Pulmonology recommended starting steroids for presumptive pneumonitis secondary to her everolimus.  Her oncologist at already discontinued this chemotherapeutic agent. She had rapid improvement with steroids. In addition, she had a dose of metolazone 2.5 mg once on 8/2 and had diuresis of approximately 4 L. Her oxygen was able to be reduced to 1 L nasal cannula. Pulmonology recommended attempting to discontinue her nasal cannula daily with ambulatory walking tests  to determine if she has desaturation during exertion.  She should remain on prednisone 40 mg daily until that time. Once she has transitioned to room air, start tapering prednisone by 10 mg per week until off. She will need to follow-up with pulmonology in approximately 3-4 weeks postdischarge.   Stage IV breast cancer status post mastectomy and chemotherapy. The patient elected no further aggressive workup related to her breast cancer. She was seen by her oncologist during this hospitalization and will follow-up with him as an outpatient and approximately 2 weeks.   Hypocalcemia, improved with IV calcium  Hypokalemia, stable, continued daily potassium while diuresing  Diabetes mellitus type 2, last A1c 7.9 on 06/10/2014, hyperglycemia from steroids.  She was started on levemir 25 units QHS with SSI with meals and has been hyperglycemic.  She will need adjustments to her insulin.  She did have an episode of fsg of 65 this morning and improved to 96 after getting some orange juice. However given her persistently high fsg readings, i will not decrease her insulin dose at this time and recommend to have it monitored at the facility closely.   CAD with total occlusion of RCA 06/2013, stable. Life expectancy less than 6 months, therefore not a candidate for statin.  Continue beta blocker and Imdur.    HTN with low normal BPs.  Her CCB and BB were help.    Acute on chronic kidney disease stage III, GFR 37, baseline creatinine 1.2-1.4.  Creatinine rose after metolazone dose.  Will repeat creatinine in AM and if stable or trending down, plan to discharge to SNF.    Anemia secondary to chronic disease, hgb trending down.  Occult stools a few month ago were negative.  She had a positive occult stool during this hospitalization which may have been from straining to have her BM.  She was transfused one unit of blood and hemoglobin has remained stable.  Epistaxis, resolving with nasal saline and vaseline and humidified air  Stasis dermatitis, resolved by elevate  legs   Consultants:  Oncology, Dr. Magrinat  Pulmonology, Dr. Sood  Procedures:  CT chest 7/26  Antibiotics:  vanc 7/25 > 7/28  ceftaz 7/25 > 7/28  Discharge Exam: Filed Vitals:   09/03/14 ( 5 AM)  BP: 134/58  Pulse: 69  Temp: 98.6F (37 C)  Resp: 18   Filed Vitals:   09/01/14 1316 09/02/14 0536 09/02/14 0755 09/02/14 1410  BP: 125/45 143/54 144/50 141/50  Pulse: 76 71  74  Temp: 97.5 F (36.4 C) 98 F (36.7 C)  97.9 F (36.6 C)  TempSrc: Oral Oral  Oral  Resp: 16 18  18  Height:      Weight:  93 kg (205 lb 0.4 oz)    SpO2: 98% 94%  96%     General: Adult female, No acute distress, appears fatigued  HEENT: no pallor, moist mucosa  Cardiovascular: RRR, nl S1, S2 no mrg,   Respiratory: Rales at the bilateral bases, no wheezes, no rhonchi,  Abdomen: NABS, soft, NT/ND  MSK:  2+ pitting edema bilateral LEE ( improving from past few days)  Neuro: alert and oriented   Discharge Instructions      Discharge Instructions    Call MD for:  difficulty breathing, headache or visual disturbances    Complete by:  As directed      Call MD for:  extreme fatigue    Complete by:  As directed      Call MD for:  hives      Complete by:  As directed      Call MD for:  persistant dizziness or light-headedness    Complete by:  As directed      Call MD for:  persistant nausea and vomiting    Complete by:  As directed      Call MD for:  severe uncontrolled pain    Complete by:  As directed      Call MD for:  temperature >100.4    Complete by:  As directed      Diet Carb Modified    Complete by:  As directed      Increase activity slowly    Complete by:  As directed             Medication List    STOP taking these medications        everolimus 5 MG tablet  Commonly known as:  AFINITOR     hydrochlorothiazide 12.5 MG capsule  Commonly known as:  MICROZIDE     ibuprofen 200 MG tablet  Commonly known as:  ADVIL,MOTRIN     Insulin Isophane &  Regular Human (70-30) 100 UNIT/ML PEN  Commonly known as:  HUMULIN 70/30 MIX     levofloxacin 250 MG tablet  Commonly known as:  LEVAQUIN      TAKE these medications        ACCU-CHEK AVIVA PLUS test strip  Generic drug:  glucose blood  CHECK BLOOD SUGAR TWICE A DAY FOR DX:250.00     ACCU-CHEK NANO SMARTVIEW W/DEVICE Kit  Use to check blood sugar twice a day. DX 250.00     amLODipine 10 MG tablet  Commonly known as:  NORVASC  TAKE ONE TABLET BY MOUTH EVERY DAY     aspirin 81 MG chewable tablet  Chew 81 mg by mouth daily.     atorvastatin 80 MG tablet  Commonly known as:  LIPITOR  Take 1 tablet (80 mg total) by mouth daily.     calcium carbonate 500 MG chewable tablet  Commonly known as:  TUMS  Chew 1 tablet (200 mg of elemental calcium total) by mouth 3 (three) times daily with meals.     carvedilol 25 MG tablet  Commonly known as:  COREG  Take 1 tablet (25 mg total) by mouth 2 (two) times daily with a meal.     clopidogrel 75 MG tablet  Commonly known as:  PLAVIX  Take 1 tablet (75 mg total) by mouth daily.     cyanocobalamin 1000 MCG/ML injection  Commonly known as:  (VITAMIN B-12)  Inject 1,000 mcg into the muscle every 30 (thirty) days.     darbepoetin 200 MCG/0.4ML Soln injection  Commonly known as:  ARANESP  Inject 200 mcg into the skin every 14 (fourteen) days.     exemestane 25 MG tablet  Commonly known as:  AROMASIN  Take 1 tablet (25 mg total) by mouth daily after breakfast.     furosemide 80 MG tablet  Commonly known as:  LASIX  Take 1 tablet (80 mg total) by mouth 2 (two) times daily.  Start taking on:  09/03/2014     gabapentin 100 MG capsule  Commonly known as:  NEURONTIN  Take 1 capsule (100 mg total) by mouth 3 (three) times daily.     glycerin adult 2 G Supp  Commonly known as:  glycerin adult  Place 1 suppository rectally once as needed (constipation).     insulin detemir 100 UNIT/ML injection  Commonly known  as:  LEVEMIR  Inject 0.25  mLs (25 Units total) into the skin at bedtime.     insulin lispro 100 UNIT/ML injection  Commonly known as:  HUMALOG  Inject 0.15 mLs (15 Units total) into the skin 3 (three) times daily before meals.     isosorbide mononitrate 60 MG 24 hr tablet  Commonly known as:  IMDUR  Take 1 tablet (60 mg total) by mouth 2 (two) times daily.     levothyroxine 137 MCG tablet  Commonly known as:  SYNTHROID, LEVOTHROID  Take 1 tablet (137 mcg total) by mouth daily before breakfast.     morphine 30 MG 12 hr tablet  Commonly known as:  MS CONTIN  Take 1 tablet (30 mg total) by mouth 2 (two) times daily.     nitroGLYCERIN 0.4 MG SL tablet  Commonly known as:  NITROSTAT  Place 1 tablet (0.4 mg total) under the tongue every 5 (five) minutes as needed for chest pain.     omeprazole 40 MG capsule  Commonly known as:  PRILOSEC  Take 1 capsule (40 mg total) by mouth daily.     ondansetron 4 MG tablet  Commonly known as:  ZOFRAN  Take 1 tablet (4 mg total) by mouth every 8 (eight) hours as needed for nausea or vomiting.     polyethylene glycol packet  Commonly known as:  MIRALAX / GLYCOLAX  Take 17 g by mouth daily as needed for mild constipation.     potassium chloride SA 20 MEQ tablet  Commonly known as:  K-DUR,KLOR-CON  Take 2 tablets (40 mEq total) by mouth daily.     predniSONE 20 MG tablet  Commonly known as:  DELTASONE  Take 2 tablets (40 mg total) by mouth daily with breakfast.     prochlorperazine 10 MG tablet  Commonly known as:  COMPAZINE  Take 1 tablet (10 mg total) by mouth every 6 (six) hours as needed for nausea or vomiting.     senna-docusate 8.6-50 MG per tablet  Commonly known as:  Senokot-S  Take 1 tablet by mouth at bedtime.       Follow-up Information    Follow up with PARRETT,TAMMY, NP On 10/02/2014.   Specialty:  Nurse Practitioner   Why:  0900   Contact information:   [address] [address] [phone]       Follow up with [name],  MD. Schedule an appointment as soon as possible for a visit in 2 weeks.   Specialty:  Family Medicine   Contact information:   [address] [place] [address] [phone]       Follow up with Chauncey Cruel, MD. Schedule an appointment as soon as possible for a visit in 2 weeks.   Specialty:  Oncology   Contact information:   [place] [address] [phone]        The results of significant diagnostics from this hospitalization (including imaging, microbiology, ancillary and laboratory) are listed below for reference.    Significant Diagnostic Studies: Dg Chest 2 View  09/01/2014   CLINICAL DATA:  Shortness of breath and weakness, followup pneumonitis  EXAM: CHEST  2 VIEW  COMPARISON:  08/30/2014  FINDINGS: Improvement in diffuse patchy pulmonary airspace opacities bilaterally. Trace pleural effusions persist. Moderate enlargement of the cardiac silhouette is again noted. Right-sided Port-A-Cath in place with tip over the cavoatrial junction.  IMPRESSION: Improving diffuse pulmonary airspace opacities which could reflect edema or  pneumonitis or other alveolar filling processes.   Electronically Signed   By: Gretchen  Green M.D.   On: 09/01/2014 09:32   Dg Chest 2 View  08/28/2014   CLINICAL DATA:  History of metastatic breast carcinoma with shortness of Breath  EXAM: CHEST - 2 VIEW  COMPARISON:  08/24/2014, 08/25/2014  FINDINGS: Cardiac shadow is stable. A right chest wall port is again seen in satisfactory position. Patchy infiltrates are again identified throughout both lungs. Diffuse bony metastatic disease is identified. Aortic calcifications are seen and stable.  IMPRESSION: Stable bilateral infiltrates when compare with the prior exams.  Diffuse bony metastatic disease.   Electronically Signed   By: Mark  Lukens M.D.   On: 08/28/2014 09:41   Dg Chest 2 View  08/24/2014   CLINICAL DATA:  Increasing shortness of breath, history of carcinoma of the  breast with bone metastatic disease  EXAM: CHEST - 2 VIEW  COMPARISON:  08/10/2014  FINDINGS: Cardiac shadow is stable. Increasing infiltrates are noted within both lungs when compare with the prior exam. Diffuse bony metastatic disease is seen. A right chest wall port is again noted.  IMPRESSION: Increasing bilateral infiltrates.   Electronically Signed   By: Mark  Lukens M.D.   On: 08/24/2014 10:44   Dg Chest 2 View  08/10/2014   CLINICAL DATA:  Hypercalcemia with weakness, shortness breath and difficulty breathing. Initial encounter.  EXAM: CHEST  2 VIEW  COMPARISON:  Radiographs 04/13/2012.  CT 07/15/2014.  FINDINGS: The heart size and mediastinal contours are stable without definite adenopathy. There is diffuse atherosclerosis of the aorta and coronary arteries. There are new patchy bibasilar airspace opacities with small bilateral pleural effusions. Underlying extensive osseous metastatic disease is grossly stable without evidence of pathologic fracture. Right subclavian Port-A-Cath tip appears unchanged near the SVC right atrial junction.  IMPRESSION: New patchy bibasilar airspace opacities and small bilateral pleural effusions, suspicious for edema or atypical inflammation. The change from the CT of less than 1 month ago makes metastatic disease to the lungs unlikely. Diffuse osseous metastases are grossly stable.   Electronically Signed   By: William  Veazey M.D.   On: 08/10/2014 18:14   Ct Chest Wo Contrast  08/25/2014   CLINICAL DATA:  Shortness of breath, pneumonia, mastectomy.  EXAM: CT CHEST WITHOUT CONTRAST  TECHNIQUE: Multidetector CT imaging of the chest was performed following the standard protocol without IV contrast.  COMPARISON:  Radiograph 08/24/2014, CT 07/15/2014  FINDINGS: Mediastinum/Nodes: There is no axillary or supraclavicular lymphadenopathy. Port in the right chest wall. No mediastinal hilar lymphadenopathy. No pericardial fluid. Esophagus normal. Coronary calcification and  aortic calcification.  Lungs/Pleura: There is a diffuse dense airspace disease with air bronchograms involving the upper lobes and lower lobes. There is some consolidated nodularity in the lower lobes. For example 2 cm nodule at the left lung base on image 42, series 5. Small pleural effusions noted on the right.  Upper abdomen: Limited view of the liver, kidneys, pancreas are unremarkable. Normal adrenal glands.  Musculoskeletal: 10 sclerotic metastasis involving the spine sternum and ribs. No change.  IMPRESSION: 1. Dense bilateral airspace disease with differential including multifocal pneumonia, severe pulmonary edema, or lymphangitic spread of carcinoma. Consolidative nodularity at the left lung base. 2. Small right effusion. 3. Stable sclerotic metastasis.   Electronically Signed   By: Stewart  Edmunds M.D.   On: 08/25/2014 08:38   Dg Chest Port 1 View  08/30/2014   CLINICAL DATA:  Short of breath. Bilateral breast cancer. Valvular   heart disease.  EXAM: PORTABLE CHEST - 1 VIEW  COMPARISON:  Radiographs 08/28/2014  FINDINGS: RIGHT port in place. Stable cardiac silhouette. There is interval increase in bilateral pulmonary airspace disease. Diffuse sclerotic metastasis.  IMPRESSION: Interval increase in bilateral pulmonary airspace disease.  Diffuse sclerotic metastasis.   Electronically Signed   By: Stewart  Edmunds M.D.   On: 08/30/2014 10:06    Microbiology: Recent Results (from the past 240 hour(s))  Culture, blood (routine x 2) Call MD if unable to obtain prior to antibiotics being given     Status: None   Collection Time: 08/24/14  2:00 PM  Result Value Ref Range Status   Specimen Description BLOOD LEFT HAND  Final   Special Requests IN PEDIATRIC BOTTLE 4CC  Final   Culture   Final    NO GROWTH 5 DAYS Performed at [facility]    Report Status 08/29/2014 FINAL  Final  Culture, blood (routine x 2) Call MD if unable to obtain prior to antibiotics being given     Status: None    Collection Time: 08/24/14  2:10 PM  Result Value Ref Range Status   Specimen Description BLOOD LEFT ARM  Final   Special Requests BOTTLES DRAWN AEROBIC AND ANAEROBIC 10CC  Final   Culture   Final    NO GROWTH 5 DAYS Performed at [facility]    Report Status 08/29/2014 FINAL  Final  Respiratory virus panel     Status: None   Collection Time: 08/25/14  7:19 PM  Result Value Ref Range Status   Source - RVPAN NASAL WASHINGS  Corrected   Respiratory Syncytial Virus A Negative Negative Final   Respiratory Syncytial Virus B Negative Negative Final   Influenza A Negative Negative Final   Influenza B Negative Negative Final   Parainfluenza 1 Negative Negative Final   Parainfluenza 2 Negative Negative Final   Parainfluenza 3 Negative Negative Final   Metapneumovirus Negative Negative Final   Rhinovirus Negative Negative Final   Adenovirus Negative Negative Final    Comment: (NOTE) Performed At: [facility] [place] [address] , [place] [id number] [place] William F MD Ph:[phone]      Labs: Basic Metabolic Panel:  Recent Labs Lab 08/29/14 0515 08/30/14 0545 08/31/14 0430 09/01/14 0352 09/02/14 0350  NA 135 136 133* 133* 135  K 3.5 3.8 3.8 3.7 3.4*  CL 92* 92* 88* 88* 87*  CO2 29 32 32 32 36*  GLUCOSE 143* 152* 313* 258* 218*  BUN 28* 28* 33* 36* 43*  CREATININE 1.36* 1.45* 1.57* 1.49* 1.71*  CALCIUM 6.8* 7.0* 7.0* 7.1* 7.3*   Liver Function Tests: No results for input(s): AST, ALT, ALKPHOS, BILITOT, PROT, ALBUMIN in the last 168 hours. No results for input(s): LIPASE, AMYLASE in the last 168 hours. No results for input(s): AMMONIA in the last 168 hours. CBC:  Recent Labs Lab 08/29/14 0515 08/30/14 0545 08/31/14 0430 09/01/14 0352 09/02/14 0350  WBC 6.6 6.7 6.4 6.9 7.8  NEUTROABS 4.0 4.3 4.7 5.1 5.9  HGB 8.4* 7.8* 7.7* 8.6* 8.7*  HCT 26.3* 24.6* 24.0* 26.6* 26.8*  MCV 86.8 87.2 86.6 86.4 86.5  PLT 153 152 163 199 240   Cardiac Enzymes: No  results for input(s): CKTOTAL, CKMB, CKMBINDEX, TROPONINI in the last 168 hours. BNP: BNP (last 3 results)  Recent Labs  08/07/14 1158 08/10/14 1715 08/24/14 1042  BNP 343.0* 354.4* 478.7*    ProBNP (last 3 results) No results for input(s): PROBNP in the last 8760   hours.  CBG:  Recent Labs Lab 09/01/14 1646 09/01/14 2132 09/02/14 0711 09/02/14 1157 09/02/14 1639  GLUCAP 316* 262* 182* 319* 284*    Time coordinating discharge: 35 minutes  Signed:  SHORT, MACKENZIE  [facility] 09/02/2014, 4:51 PM     

## 2014-09-02 NOTE — Progress Notes (Signed)
CSW continuing to follow.   CSW followed up with pt and multiple family members at bedside today.   Per MD, pt not yet medically ready for discharge today, but hopeful for discharge tomorrow.  Pt confirmed plans for [facility] for disposition. Pt family in agreement to plan. Pt aware of potential for discharge tomorrow. CSW provided support and clarified questions.  CSW contacted [facility] and updated facility.  CSW to continue to follow to provide support and assist with pt discharge planning needs to [facility] when pt medically ready for discharge.  Alison Murray, MSW, [place] [facility] [phone]

## 2014-09-02 NOTE — Progress Notes (Signed)
Name: Brittney Cunningham MRN: [id number] DOB: Jun 30, 1940    ADMISSION DATE:  08/24/2014 CONSULTATION DATE:  08/27/2014  REFERRING MD :  Dr. Sheran Fava  CHIEF COMPLAINT:  Dyspnea  BRIEF PATIENT DESCRIPTION:  74 yo female with remote hx of smoking admitted with cough, dyspnea, hypoxia, and pulmonary infiltrates initially thought to be HCAP.  She has hx of Stage IV breast cancer dx in 2011 with diffuse bone mets s/p Rt modified radical mastectomy, XRT, letrozole >> changed to fulvestrant until 06/03/14, zoledronic acid >> changed to denosumab on 07/24/14, and exemestane and everolimus started 07/08/14.  SIGNIFICANT EVENTS  7/11 to 7/13  Admit for hypocalcemia, pneumonia 7/25 Readmitted, oncology consulted, DNR status established 7/31 Started solumedrol 8/02 Change to prednisone  STUDIES:  6/07 Echo >> EF 55 to 40%, grade 1 diastolic dysfx 9/81 CT chest >> 5 mm nodule Rt lung base, diffuse sclerotic lesions 7/26 CT chest >> diffuse dense ASD with air bronchograms b/l  SUBJECTIVE:   Feeling much better.  Able to walk in hall more with minimal dyspnea.  Down to 1L [place]  Denies cough or sputum. Sugars decreasing  VITAL SIGNS: Temp:  [97.5 F (36.4 C)-98 F (36.7 C)] 98 F (36.7 C) (08/03 0536) Pulse Rate:  [71-76] 71 (08/03 0536) Resp:  [16-18] 18 (08/03 0536) BP: (125-144)/(45-54) 144/50 mmHg (08/03 0755) SpO2:  [94 %-98 %] 94 % (08/03 0536) Weight:  [93 kg (205 lb 0.4 oz)] 93 kg (205 lb 0.4 oz) (08/03 0536)  PHYSICAL EXAMINATION: General: pleasant Neuro:  Awake, alert,appropriate HEENT: no sinus tenderness Cardiovascular: regular Lungs: no wheeze Abdomen: soft, non tender Musculoskeletal: 1+ edema   CMP Latest Ref Rng 09/02/2014 09/01/2014 08/31/2014  Glucose 65 - 99 mg/dL 218(H) 258(H) 313(H)  BUN 6 - 20 mg/dL 43(H) 36(H) 33(H)  Creatinine 0.44 - 1.00 mg/dL 1.71(H) 1.49(H) 1.57(H)  Sodium 135 - 145 mmol/L 135 133(L) 133(L)  Potassium 3.5 - 5.1 mmol/L 3.4(L) 3.7 3.8  Chloride 101 -  111 mmol/L 87(L) 88(L) 88(L)  CO2 22 - 32 mmol/L 36(H) 32 32  Calcium 8.9 - 10.3 mg/dL 7.3(L) 7.1(L) 7.0(L)  Total Protein 6.5 - 8.1 g/dL - - -  Total Bilirubin 0.3 - 1.2 mg/dL - - -  Alkaline Phos 38 - 126 U/L - - -  AST 15 - 41 U/L - - -  ALT 14 - 54 U/L - - -    CBC Latest Ref Rng 09/02/2014 09/01/2014 08/31/2014  WBC 4.0 - 10.5 K/uL 7.8 6.9 6.4  Hemoglobin 12.0 - 15.0 g/dL 8.7(L) 8.6(L) 7.7(L)  Hematocrit 36.0 - 46.0 % 26.8(L) 26.6(L) 24.0(L)  Platelets 150 - 400 K/uL 240 199 163    BNP (last 3 results)  Recent Labs  08/07/14 1158 08/10/14 1715 08/24/14 1042  BNP 343.0* 354.4* 478.7*    Lab Results  Component Value Date   ESRSEDRATE 124* 08/27/2014    Dg Chest 2 View  09/01/2014   CLINICAL DATA:  Shortness of breath and weakness, followup pneumonitis  EXAM: CHEST  2 VIEW  COMPARISON:  08/30/2014  FINDINGS: Improvement in diffuse patchy pulmonary airspace opacities bilaterally. Trace pleural effusions persist. Moderate enlargement of the cardiac silhouette is again noted. Right-sided Port-A-Cath in place with tip over the cavoatrial junction.  IMPRESSION: Improving diffuse pulmonary airspace opacities which could reflect edema or pneumonitis or other alveolar filling processes.   Electronically Signed   By: Conchita Paris M.D.   On: 09/01/2014 09:32    CULTURES: Influenza PCR 7/25 >> negative  Legionella total Ab 7/25 >> negative Legionella urine Ag 7/25 >> negative Pneumococcal urine Ag 7/25 >> negative Blood 7/25 >> negative Respiratory viral panel 7/26 >> negative   ANTIBIOTICS: Ceftazidime 7/25 >> 7/28 Vancomycin 7/25 >> 7/28   DISCUSSION:  Acute hypoxic respiratory failure with b/l pulmonary infiltrates >> most likely from reaction to chemotherapy.  No improvement with diuresis, and nothing to support infection.  Clinical status and xray improving since starting steroids.  ASSESSMENT / PLAN:  Chemotherapy induced pneumonitis ?from everolimus. Plan: - change  to prednisone 40 mg daily on 8/02 - f/u CXR intermittently -once clinical improvement & off o2, can taper slowly by 10 mg per week to off  Acute hypoxic respiratory failure. Plan: - adjust oxygen to keep SpO2 > 90% -decide about home O2 when  closer to dc  Hyperglycemia -worsened by steroids -per [facility]  Hypervolemia. Hx of HTN, CKD stage III. Plan: - diuresis per primary team  Stage IV Breast cancer. Plan: - will need to d/w oncology about future options for chemotherapy  Goals of care >> DNR.  Updated pt's husband at bedside. Out pt Fu arranged  Kara Mead MD. [place] Flood.  Pulmonary & Critical care Pager [phone] If no response call [phone]    09/02/2014, 10:26 AM

## 2014-09-02 NOTE — Progress Notes (Signed)
Inpatient Diabetes Program Recommendations  AACE/ADA: New Consensus Statement on Inpatient Glycemic Control (2013)  Target Ranges:  Prepandial:   less than 140 mg/dL      Peak postprandial:   less than 180 mg/dL (1-2 hours)      Critically ill patients:  140 - 180 mg/dL   Noted orders for basal and meal coverage started yesterday evening. Fasting improved today, however the pre-meal lunch and supper and HS readings are elevated due to the prednisone 40 mg daily. May need increase to 5-6 units meal coverage if these reading continue into high 200's and 300's.  Thank you Rosita Kea, RN, MSN, CDE  Diabetes Inpatient Program Office: [phone] Pager: [phone] 8:00 am to 5:00 pm

## 2014-09-02 NOTE — Progress Notes (Signed)
COURTESY NOTE: Patient's improvement on steroids and d/c plans noted.  She has an appointment with me 8/22 at 8:45 AM-- we will follow up on her breast cancer plan at that time. Until then she shoud stay on exemestane (not everolimus)  Thank you!

## 2014-09-03 LAB — CBC WITH DIFFERENTIAL/PLATELET
Basophils Absolute: 0 10*3/uL (ref 0.0–0.1)
Basophils Relative: 0 % (ref 0–1)
EOS PCT: 0 % (ref 0–5)
Eosinophils Absolute: 0 10*3/uL (ref 0.0–0.7)
HEMATOCRIT: 27.4 % — AB (ref 36.0–46.0)
Hemoglobin: 9.1 g/dL — ABNORMAL LOW (ref 12.0–15.0)
LYMPHS ABS: 1.8 10*3/uL (ref 0.7–4.0)
Lymphocytes Relative: 21 % (ref 12–46)
MCH: 28.6 pg (ref 26.0–34.0)
MCHC: 33.2 g/dL (ref 30.0–36.0)
MCV: 86.2 fL (ref 78.0–100.0)
MONO ABS: 0.9 10*3/uL (ref 0.1–1.0)
Monocytes Relative: 10 % (ref 3–12)
Neutro Abs: 5.9 10*3/uL (ref 1.7–7.7)
Neutrophils Relative %: 69 % (ref 43–77)
Platelets: 269 10*3/uL (ref 150–400)
RBC: 3.18 MIL/uL — ABNORMAL LOW (ref 3.87–5.11)
RDW: 15.9 % — ABNORMAL HIGH (ref 11.5–15.5)
WBC: 8.7 10*3/uL (ref 4.0–10.5)

## 2014-09-03 LAB — BASIC METABOLIC PANEL
Anion gap: 14 (ref 5–15)
BUN: 46 mg/dL — ABNORMAL HIGH (ref 6–20)
CALCIUM: 7.6 mg/dL — AB (ref 8.9–10.3)
CHLORIDE: 83 mmol/L — AB (ref 101–111)
CO2: 37 mmol/L — AB (ref 22–32)
Creatinine, Ser: 1.58 mg/dL — ABNORMAL HIGH (ref 0.44–1.00)
GFR calc Af Amer: 37 mL/min — ABNORMAL LOW (ref >60)
GFR calc non Af Amer: 32 mL/min — ABNORMAL LOW (ref >60)
Glucose, Bld: 137 mg/dL — ABNORMAL HIGH (ref 65–99)
Potassium: 3.5 mmol/L (ref 3.5–5.1)
Sodium: 134 mmol/L — ABNORMAL LOW (ref 135–145)

## 2014-09-03 LAB — GLUCOSE, CAPILLARY
GLUCOSE-CAPILLARY: 96 mg/dL (ref 65–99)
Glucose-Capillary: 69 mg/dL (ref 65–99)

## 2014-09-03 MED ORDER — HEPARIN SOD (PORK) LOCK FLUSH 100 UNIT/ML IV SOLN
500.0000 [IU] | INTRAVENOUS | Status: AC | PRN
  Administered 2014-09-03: 500 [IU]

## 2014-09-03 NOTE — Progress Notes (Signed)
Pt for discharge to [facility].  CSW facilitated pt discharge needs including contacting facility, faxing pt discharge information via [facility], discussing with pt at bedside, pt reports that pt spouse plans to meet pt at [facility], providing RN phone number to call report, and arranging ambulance transport for pt to [facility].   Pt eager to get to [place] in order to work with therapy and get stronger to return home. CSW confirmed with pt that pt spouse plans to take pt oral chemotherapy to [facility].   No further social work needs identified at this time.  CSW signing off.   Alison Murray, MSW, [place] [facility] [phone]

## 2014-09-03 NOTE — Progress Notes (Signed)
[facility] PROGRESS NOTE  Brittney Cunningham LPF:[id number] DOB: 26-Jan-1941 DOA: 08/24/2014 PCP: [name], MD  Assessment/Plan:  Please see updated discharge summary for details.   patient is stable for discharge to SNF with outpt folow up.    HPI/Subjective: Seen and examined . Pt reports felling tired this am and her fsg dropped to 65.   Objective: Filed Vitals:   09/03/14 0500  BP: 134/58  Pulse: 69  Temp: 98.6 F (37 C)  Resp: 18    Intake/Output Summary (Last 24 hours) at 09/03/14 0956 Last data filed at 09/03/14 0932  Gross per 24 hour  Intake    942 ml  Output   3250 ml  Net  -2308 ml   Filed Weights   09/01/14 0433 09/02/14 0536 09/03/14 0500  Weight: 92.9 kg (204 lb 12.9 oz) 93 kg (205 lb 0.4 oz) 94.8 kg (208 lb 15.9 oz)    Exam:   General: Adult female, No acute distress, appears fatigued  HEENT: no pallor, moist mucosa  Cardiovascular: RRR, nl S1, S2 no mrg,   Respiratory: Rales at the bilateral bases, no wheezes, no rhonchi,  Abdomen: NABS, soft, NT/ND  MSK: 2+ pitting edema bilateral LEE ( improving from past few days)  Neuro: alert and oriented  Data Reviewed: Basic Metabolic Panel:  Recent Labs Lab 08/30/14 0545 08/31/14 0430 09/01/14 0352 09/02/14 0350 09/03/14 0350  NA 136 133* 133* 135 134*  K 3.8 3.8 3.7 3.4* 3.5  CL 92* 88* 88* 87* 83*  CO2 32 32 32 36* 37*  GLUCOSE 152* 313* 258* 218* 137*  BUN 28* 33* 36* 43* 46*  CREATININE 1.45* 1.57* 1.49* 1.71* 1.58*  CALCIUM 7.0* 7.0* 7.1* 7.3* 7.6*   Liver Function Tests: No results for input(s): AST, ALT, ALKPHOS, BILITOT, PROT, ALBUMIN in the last 168 hours. No results for input(s): LIPASE, AMYLASE in the last 168 hours. No results for input(s): AMMONIA in the last 168 hours. CBC:  Recent Labs Lab 08/30/14 0545 08/31/14 0430 09/01/14 0352 09/02/14 0350 09/03/14 0350  WBC 6.7 6.4 6.9 7.8 8.7  NEUTROABS 4.3 4.7 5.1 5.9 5.9  HGB 7.8* 7.7* 8.6*  8.7* 9.1*  HCT 24.6* 24.0* 26.6* 26.8* 27.4*  MCV 87.2 86.6 86.4 86.5 86.2  PLT 152 163 199 240 269   Cardiac Enzymes: No results for input(s): CKTOTAL, CKMB, CKMBINDEX, TROPONINI in the last 168 hours. BNP (last 3 results)  Recent Labs  08/07/14 1158 08/10/14 1715 08/24/14 1042  BNP 343.0* 354.4* 478.7*    ProBNP (last 3 results) No results for input(s): PROBNP in the last 8760 hours.  CBG:  Recent Labs Lab 09/01/14 2132 09/02/14 0711 09/02/14 1157 09/02/14 1639 09/02/14 2050  GLUCAP 262* 182* 319* 284* 253*    Recent Results (from the past 240 hour(s))  Culture, blood (routine x 2) Call MD if unable to obtain prior to antibiotics being given     Status: None   Collection Time: 08/24/14  2:00 PM  Result Value Ref Range Status   Specimen Description BLOOD LEFT HAND  Final   Special Requests IN PEDIATRIC BOTTLE 4CC  Final   Culture   Final    NO GROWTH 5 DAYS Performed at [facility]    Report Status 08/29/2014 FINAL  Final  Culture, blood (routine x 2) Call MD if unable to obtain prior to antibiotics being given     Status: None   Collection Time: 08/24/14  2:10 PM  Result Value Ref Range Status  Specimen Description BLOOD LEFT ARM  Final   Special Requests BOTTLES DRAWN AEROBIC AND ANAEROBIC 10CC  Final   Culture   Final    NO GROWTH 5 DAYS Performed at [facility]    Report Status 08/29/2014 FINAL  Final  Respiratory virus panel     Status: None   Collection Time: 08/25/14  7:19 PM  Result Value Ref Range Status   Source - RVPAN NASAL WASHINGS  Corrected   Respiratory Syncytial Virus A Negative Negative Final   Respiratory Syncytial Virus B Negative Negative Final   Influenza A Negative Negative Final   Influenza B Negative Negative Final   Parainfluenza 1 Negative Negative Final   Parainfluenza 2 Negative Negative Final   Parainfluenza 3 Negative Negative Final   Metapneumovirus Negative Negative Final   Rhinovirus Negative Negative  Final   Adenovirus Negative Negative Final    Comment: (NOTE) Performed At: [facility] [place], [place] [id number] Lindon Romp MD XJ:[id number]      Studies: No results found.  Scheduled Meds: . amLODipine  10 mg Oral Daily  . aspirin  81 mg Oral Daily  . atorvastatin  80 mg Oral Daily  . calcium carbonate  1 tablet Oral TID WC  . carvedilol  3.125 mg Oral BID WC  . cholecalciferol  1,000 Units Oral Daily  . clopidogrel  75 mg Oral Daily  . enoxaparin (LOVENOX) injection  40 mg Subcutaneous Q24H  . exemestane  25 mg Oral QPC breakfast  . furosemide  80 mg Oral BID  . gabapentin  100 mg Oral TID  . insulin aspart  0-20 Units Subcutaneous TID WC  . insulin aspart  0-5 Units Subcutaneous QHS  . insulin aspart  5 Units Subcutaneous TID WC  . insulin detemir  25 Units Subcutaneous QHS  . isosorbide mononitrate  60 mg Oral BID  . levothyroxine  137 mcg Oral QAC breakfast  . morphine  30 mg Oral BID  . ondansetron  4 mg Oral TID AC  . pantoprazole  20 mg Oral Daily  . potassium chloride  40 mEq Oral Daily  . predniSONE  40 mg Oral Q breakfast  . senna-docusate  1 tablet Oral QHS  . sodium chloride  10-40 mL Intracatheter Q12H   Continuous Infusions:     Time spent: 15 minutes    Louellen Molder  [facility] Pager [phone] If 7PM-7AM, please contact night-coverage at [email or web address], password [facility] 09/03/2014, 9:56 AM  LOS: 10 days

## 2014-09-03 NOTE — Clinical Social Work Placement (Signed)
   CLINICAL SOCIAL WORK PLACEMENT  NOTE  Date:  09/03/2014  Patient Details  Name: Brittney Cunningham MRN: [id number] Date of Birth: 1940-06-12  Clinical Social Work is seeking post-discharge placement for this patient at the [place] level of care (*CSW will initial, date and re-position this form in  chart as items are completed):  Yes   Patient/family provided with [place] Work Department's list of facilities offering this level of care within the geographic area requested by the patient (or if unable, by the patient's family).  Yes   Patient/family informed of their freedom to choose among providers that offer the needed level of care, that participate in Medicare, Medicaid or managed care program needed by the patient, have an available bed and are willing to accept the patient.  Yes   Patient/family informed of [facility]'s ownership interest in [facility] and [facility], as well as of the fact that they are under no obligation to receive care at these facilities.  PASRR submitted to EDS on 08/27/14     PASRR number received on 08/27/14     Existing PASRR number confirmed on       FL2 transmitted to all facilities in geographic area requested by pt/family on 08/27/14     FL2 transmitted to all facilities within larger geographic area on       Patient informed that his/her managed care company has contracts with or will negotiate with certain facilities, including the following:        Yes   Patient/family informed of bed offers received.  Patient chooses bed at [facility]     Physician recommends and patient chooses bed at      Patient to be transferred to [facility] on 09/03/14.  Patient to be transferred to facility by ambulance Corey Harold)     Patient family notified on 09/03/14 of transfer.  Name of family member notified:  pt notified at bedside and pt notified pt husband via telephone     PHYSICIAN Please sign FL2,  Please sign DNR     Additional Comment:    _______________________________________________ Ladell Pier, LCSW 09/03/2014, 11:55 AM

## 2014-09-03 NOTE — Care Management Important Message (Signed)
Important Message  Patient Details  Name: Brittney Cunningham MRN: [id number] Date of Birth: 02/28/1940   Medicare Important Message Given:  [facility] notification given    Camillo Flaming 09/03/2014, 1:46 PMImportant Message  Patient Details  Name: Brittney Cunningham MRN: [id number] Date of Birth: 1940/12/03   Medicare Important Message Given:  Yes-second notification given    Camillo Flaming 09/03/2014, 1:46 PM

## 2014-09-08 ENCOUNTER — Ambulatory Visit (HOSPITAL_BASED_OUTPATIENT_CLINIC_OR_DEPARTMENT_OTHER): Payer: Medicare Other

## 2014-09-08 ENCOUNTER — Other Ambulatory Visit (HOSPITAL_BASED_OUTPATIENT_CLINIC_OR_DEPARTMENT_OTHER): Payer: Medicare Other

## 2014-09-08 VITALS — BP 118/62 | HR 65 | Temp 98.0°F

## 2014-09-08 DIAGNOSIS — C50911 Malignant neoplasm of unspecified site of right female breast: Secondary | ICD-10-CM | POA: Diagnosis not present

## 2014-09-08 DIAGNOSIS — N183 Chronic kidney disease, stage 3 (moderate): Secondary | ICD-10-CM

## 2014-09-08 DIAGNOSIS — D649 Anemia, unspecified: Secondary | ICD-10-CM

## 2014-09-08 DIAGNOSIS — N189 Chronic kidney disease, unspecified: Principal | ICD-10-CM

## 2014-09-08 DIAGNOSIS — D631 Anemia in chronic kidney disease: Secondary | ICD-10-CM

## 2014-09-08 DIAGNOSIS — C7951 Secondary malignant neoplasm of bone: Principal | ICD-10-CM

## 2014-09-08 DIAGNOSIS — C50919 Malignant neoplasm of unspecified site of unspecified female breast: Secondary | ICD-10-CM

## 2014-09-08 LAB — COMPREHENSIVE METABOLIC PANEL (CC13)
ALT: 17 U/L (ref 0–55)
AST: 17 U/L (ref 5–34)
Albumin: 2.6 g/dL — ABNORMAL LOW (ref 3.5–5.0)
Alkaline Phosphatase: 116 U/L (ref 40–150)
Anion Gap: 13 mEq/L — ABNORMAL HIGH (ref 3–11)
BILIRUBIN TOTAL: 0.45 mg/dL (ref 0.20–1.20)
BUN: 47 mg/dL — AB (ref 7.0–26.0)
CALCIUM: 8.4 mg/dL (ref 8.4–10.4)
CO2: 37 meq/L — AB (ref 22–29)
Chloride: 90 mEq/L — ABNORMAL LOW (ref 98–109)
Creatinine: 1.2 mg/dL — ABNORMAL HIGH (ref 0.6–1.1)
EGFR: 45 mL/min/{1.73_m2} — ABNORMAL LOW (ref >90)
GLUCOSE: 144 mg/dL — AB (ref 70–140)
Potassium: 3.6 mEq/L (ref 3.5–5.1)
SODIUM: 140 meq/L (ref 136–145)
Total Protein: 6.3 g/dL — ABNORMAL LOW (ref 6.4–8.3)

## 2014-09-08 LAB — CBC WITH DIFFERENTIAL/PLATELET
BASO%: 0 % (ref 0.0–2.0)
BASOS ABS: 0 10*3/uL (ref 0.0–0.1)
EOS%: 0.9 % (ref 0.0–7.0)
Eosinophils Absolute: 0.1 10*3/uL (ref 0.0–0.5)
HCT: 29.2 % — ABNORMAL LOW (ref 34.8–46.6)
HGB: 9.4 g/dL — ABNORMAL LOW (ref 11.6–15.9)
LYMPH%: 11.7 % — AB (ref 14.0–49.7)
MCH: 28.1 pg (ref 25.1–34.0)
MCHC: 32.2 g/dL (ref 31.5–36.0)
MCV: 87.4 fL (ref 79.5–101.0)
MONO#: 0.7 10*3/uL (ref 0.1–0.9)
MONO%: 8.1 % (ref 0.0–14.0)
NEUT#: 7 10*3/uL — ABNORMAL HIGH (ref 1.5–6.5)
NEUT%: 79.3 % — AB (ref 38.4–76.8)
Platelets: 260 10*3/uL (ref 145–400)
RBC: 3.34 10*6/uL — ABNORMAL LOW (ref 3.70–5.45)
RDW: 16.7 % — ABNORMAL HIGH (ref 11.2–14.5)
WBC: 8.8 10*3/uL (ref 3.9–10.3)
lymph#: 1 10*3/uL (ref 0.9–3.3)

## 2014-09-08 LAB — FERRITIN CHCC: Ferritin: 816 ng/ml — ABNORMAL HIGH (ref 9–269)

## 2014-09-08 MED ORDER — CYANOCOBALAMIN 1000 MCG/ML IJ SOLN
1000.0000 ug | Freq: Once | INTRAMUSCULAR | Status: AC
  Administered 2014-09-08: 1000 ug via INTRAMUSCULAR

## 2014-09-08 MED ORDER — DARBEPOETIN ALFA 200 MCG/0.4ML IJ SOSY
200.0000 ug | PREFILLED_SYRINGE | Freq: Once | INTRAMUSCULAR | Status: AC
  Administered 2014-09-08: 200 ug via SUBCUTANEOUS
  Filled 2014-09-08: qty 0.4

## 2014-09-09 ENCOUNTER — Ambulatory Visit: Payer: Self-pay | Admitting: Family Medicine

## 2014-09-09 LAB — CANCER ANTIGEN 27.29: CA 27.29: 215 U/mL — AB (ref 0–39)

## 2014-09-11 ENCOUNTER — Encounter: Payer: Self-pay | Admitting: Family Medicine

## 2014-09-11 ENCOUNTER — Ambulatory Visit (INDEPENDENT_AMBULATORY_CARE_PROVIDER_SITE_OTHER): Payer: Medicare Other | Admitting: Family Medicine

## 2014-09-11 VITALS — BP 131/81 | HR 82 | Temp 98.0°F | Resp 18 | Ht 62.2 in | Wt 205.1 lb

## 2014-09-11 DIAGNOSIS — D638 Anemia in other chronic diseases classified elsewhere: Secondary | ICD-10-CM

## 2014-09-11 DIAGNOSIS — J9601 Acute respiratory failure with hypoxia: Secondary | ICD-10-CM

## 2014-09-11 DIAGNOSIS — C7951 Secondary malignant neoplasm of bone: Secondary | ICD-10-CM

## 2014-09-11 DIAGNOSIS — C50919 Malignant neoplasm of unspecified site of unspecified female breast: Secondary | ICD-10-CM

## 2014-09-11 DIAGNOSIS — E118 Type 2 diabetes mellitus with unspecified complications: Secondary | ICD-10-CM

## 2014-09-11 NOTE — Progress Notes (Signed)
Pre visit review using our clinic review tool, if applicable. No additional management support is needed unless otherwise documented below in the visit note. 

## 2014-09-11 NOTE — Progress Notes (Signed)
OFFICE VISIT  09/14/2014   CC:  Chief Complaint  Patient presents with  . Hospitalization Follow-up    In [facility] rehab - still very tired   HPI:    Patient is a 74 y.o. Caucasian female who presents for hospital follow up for severe pneumonitis that is believed to be secondary to her everolimus, which has since been d/c'd.  There was also some speculation that she may have pulmonary lymphangitic spread of her breast cancer as well has some contribution from diastolic CHF.   Still very tired but walking a lot better.  Prob's with balance--uses walker.  Currently living in [place] rehab, says it is helping a lot, hopes to get out next Friday (one week). She is using oxygen with exertion at [place].    Some elevated glucoses on prednisone but getting SSI at rehab. Her steroids drop today to 30 mg qd.  The streroids hype her up and make it difficult to sleep. She has f/u with onc 09/21/14. She got aranesp and b12 3 d/a, also f/u CMET and CBC 3 d/a that were good (see below).  Past Medical History  Diagnosis Date  . 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 Depression   . GERD (gastroesophageal reflux disease)   . Hyperlipidemia   . Hypothyroidism   . Osteoarthritis   . Carotid artery stenosis     bilateral  . CAD (coronary artery disease)     medical therapy, old tot RCA, patent LAD, mod. severe ostial LCX stenosis--PCI would be too complex-med mgmt recommended.  . OSA (obstructive sleep apnea)   . Hypertension   . Anxiety   . Carotid artery occlusion   . Unspecified constipation 03/15/2012  . Bursitis   . Collagen vascular disease   . History of radiation therapy 10/31/12-11/13/12    rt&lt humerous 30Gy/23f  . Valvular heart disease 07/11/2013  . Diarrhea 09/28/2013  . Chronic renal insufficiency, stage II (mild)     Borderline stage II/III, CrCl 50s/60s.  . History of blood transfusion   . Urinary tract bacterial infections   .  Breast cancer 05/2009    Grade 2 invasive ductal carcinoma, T2 NXM1, stage IV at presentation. There was bone only involvement.   . Diabetes mellitus type II     insulin  . Hypocalcemia 07/2014    required IV calcium in ED once and was admitted to hosp once.  ? secondary to [facility]?    Past Surgical History  Procedure Laterality Date  . Incisional breast biopsy      remoted left breast biopsy  . Cesarean section    . Other surgical history      GYN surgery  . Knee surgery      right knee surgery  . Carotid endarterectomy  2011    right carotid  . Port-a-cath removal    . Modified radical mastectomy  Feb 2012    Right breast  . Carotid stent      left  . Cardiac catheterization  06/2013    old total occ. of RCA, moderately severe ostial LCX stenosis- medical therapy- would be complex PCI  . Carotid angiogram N/A 03/01/2011    Procedure: CAROTID ANGIOGRAM;  Surgeon: BConrad Shelby MD;  [facility];  Service: Cardiovascular;  Laterality: N/A;  . Arch aortogram  03/01/2011    Procedure: ARCH AORTOGRAM;  Surgeon: BConrad Cobb MD;  [facility];  Service: Cardiovascular;;  . Carotid stent insertion Left 03/15/2011    Procedure: CAROTID STENT INSERTION;  Surgeon: Serafina Mitchell, MD;  [facility];  Service: Cardiovascular;  Laterality: Left;  . Left heart catheterization with coronary angiogram N/A 07/07/2013    Procedure: LEFT HEART CATHETERIZATION WITH CORONARY ANGIOGRAM;  Surgeon: Blane Ohara, MD;  Location: [facility] CATH LAB;  Service: Cardiovascular;  Laterality: N/A;  . Carotid dopplers  05/2014    Patent ICAs, bilat ECA dz: no change compared to 2014    Outpatient Prescriptions Prior to Visit  Medication Sig Dispense Refill  . ACCU-CHEK AVIVA PLUS test strip CHECK BLOOD SUGAR TWICE A DAY FOR DX:250.00 100 each 11  . amLODipine (NORVASC) 10 MG tablet TAKE ONE TABLET BY MOUTH EVERY DAY (Patient taking differently: TAKE 10 MG BY MOUTH DAILY.) 30  tablet 6  . aspirin 81 MG chewable tablet Chew 81 mg by mouth daily.    Marland Kitchen atorvastatin (LIPITOR) 80 MG tablet Take 1 tablet (80 mg total) by mouth daily. 30 tablet 11  . Blood Glucose Monitoring Suppl (ACCU-CHEK NANO SMARTVIEW) W/DEVICE KIT Use to check blood sugar twice a day. DX 250.00 1 kit 0  . calcium carbonate (TUMS) 500 MG chewable tablet Chew 1 tablet (200 mg of elemental calcium total) by mouth 3 (three) times daily with meals. 30 tablet 0  . carvedilol (COREG) 25 MG tablet Take 1 tablet (25 mg total) by mouth 2 (two) times daily with a meal. 60 tablet 6  . clopidogrel (PLAVIX) 75 MG tablet Take 1 tablet (75 mg total) by mouth daily. 30 tablet 8  . cyanocobalamin (,VITAMIN B-12,) 1000 MCG/ML injection Inject 1,000 mcg into the muscle every 30 (thirty) days.     . darbepoetin (ARANESP) 200 MCG/0.4ML SOLN Inject 200 mcg into the skin every 14 (fourteen) days.     Marland Kitchen exemestane (AROMASIN) 25 MG tablet Take 1 tablet (25 mg total) by mouth daily after breakfast. 30 tablet 2  . furosemide (LASIX) 80 MG tablet Take 1 tablet (80 mg total) by mouth 2 (two) times daily. 60 tablet 0  . gabapentin (NEURONTIN) 100 MG capsule Take 1 capsule (100 mg total) by mouth 3 (three) times daily. 90 capsule 2  . glycerin adult (GLYCERIN ADULT) 2 G SUPP Place 1 suppository rectally once as needed (constipation). 10 suppository 0  . insulin lispro (HUMALOG) 100 UNIT/ML injection Inject 0.15 mLs (15 Units total) into the skin 3 (three) times daily before meals. 20 mL 0  . isosorbide mononitrate (IMDUR) 60 MG 24 hr tablet Take 1 tablet (60 mg total) by mouth 2 (two) times daily. 60 tablet 11  . levothyroxine (SYNTHROID, LEVOTHROID) 137 MCG tablet Take 1 tablet (137 mcg total) by mouth daily before breakfast. 30 tablet 2  . morphine (MS CONTIN) 30 MG 12 hr tablet Take 1 tablet (30 mg total) by mouth 2 (two) times daily. 60 tablet 0  . nitroGLYCERIN (NITROSTAT) 0.4 MG SL tablet Place 1 tablet (0.4 mg total) under the  tongue every 5 (five) minutes as needed for chest pain. 30 tablet 0  . ondansetron (ZOFRAN) 4 MG tablet Take 1 tablet (4 mg total) by mouth every 8 (eight) hours as needed for nausea or vomiting. 100 tablet 0  . polyethylene glycol (MIRALAX / GLYCOLAX) packet Take 17 g by mouth daily as needed for mild constipation.     . potassium chloride SA (K-DUR,KLOR-CON) 20 MEQ tablet Take 2 tablets (40 mEq total) by  mouth daily. 60 tablet 0  . prochlorperazine (COMPAZINE) 10 MG tablet Take 1 tablet (10 mg total) by mouth every 6 (six) hours as needed for nausea or vomiting. 30 tablet 1  . senna-docusate (SENOKOT-S) 8.6-50 MG per tablet Take 1 tablet by mouth at bedtime.     . insulin detemir (LEVEMIR) 100 UNIT/ML injection Inject 0.25 mLs (25 Units total) into the skin at bedtime. (Patient not taking: Reported on 09/11/2014) 10 mL 0  . omeprazole (PRILOSEC) 40 MG capsule Take 1 capsule (40 mg total) by mouth daily. (Patient not taking: Reported on 09/11/2014) 30 capsule 3  . predniSONE (DELTASONE) 20 MG tablet Take 2 tablets (40 mg total) by mouth daily with breakfast. (Patient not taking: Reported on 09/11/2014) 60 tablet 0   No facility-administered medications prior to visit.    Allergies  Allergen Reactions  . Chlorhexidine Hives, Itching and Rash    This was most likely a CONTACT DERMATITIS versus true systemic allergic reaction  . Adhesive [Tape] Hives  . Codeine Swelling  . Latex Rash    ROS As per HPI  PE: Blood pressure 131/81, pulse 82, temperature 98 F (36.7 C), temperature source Oral, resp. rate 18, height 5' 2.2" (1.58 m), weight 205 lb 1.9 oz (93.042 kg), last menstrual period 03/13/2012, SpO2 91 %. Gen: Alert, well appearing.  Patient is oriented to person, place, time, and situation. CV: RRR, some ectopic beats noted, without murmur LUNGS: CTA bilat, nonlabored resps. EXT: bilat tight 3+ pitting edema  LABS:    Chemistry      Component Value Date/Time   NA 140 09/08/2014  1024   NA 134* 09/03/2014 0350   K 3.6 09/08/2014 1024   K 3.5 09/03/2014 0350   CL 83* 09/03/2014 0350   CL 104 07/08/2012 1023   CO2 37* 09/08/2014 1024   CO2 37* 09/03/2014 0350   BUN 47.0* 09/08/2014 1024   BUN 46* 09/03/2014 0350   CREATININE 1.2* 09/08/2014 1024   CREATININE 1.58* 09/03/2014 0350   CREATININE 1.34* 08/10/2014 1145      Component Value Date/Time   CALCIUM 8.4 09/08/2014 1024   CALCIUM 7.6* 09/03/2014 0350   ALKPHOS 116 09/08/2014 1024   ALKPHOS 70 08/26/2014 0550   AST 17 09/08/2014 1024   AST 29 08/26/2014 0550   ALT 17 09/08/2014 1024   ALT 12* 08/26/2014 0550   BILITOT 0.45 09/08/2014 1024   BILITOT 0.6 08/26/2014 0550     Lab Results  Component Value Date   WBC 8.8 09/08/2014   HGB 9.4* 09/08/2014   HCT 29.2* 09/08/2014   MCV 87.4 09/08/2014   PLT 260 09/08/2014    IMPRESSION AND PLAN:  1) Acute resp failure with hypoxia: resolving. Appears to have been secondary to pneumonitis caused by her everolimus.  Tapering steroids. Weened oxygen. Unclear if lymphangitic spread of breast ca or diastolic CHF contributed.  2) Anemia of chronic dz/renal insuff/bone mets: she got transfused 1 U pRBCs in hosp.  Hb nadir prior to transfusion was 7.7 on 08/31/14.  It was 9.4 at most recent check---stable s/p transfusion.  3) Hypocalcemia: unclear etiology, but her XGEVA was most likely culprit. Most recent check excellent.  4) DM 2: glucoses up some on systemic steroids as expected.  Continue current insulin dosing, plus she gets SSI for hyperglycemia in current rehab facility.  5) Stage IV breast cancer s/p mastectomy and chemo: has approp outpt f/u with oncologist.  No changes made today.  FOLLOW UP: Return in   about 2 weeks (around 09/25/2014) for routine chronic illness f/u.

## 2014-09-17 ENCOUNTER — Other Ambulatory Visit: Payer: Self-pay

## 2014-09-17 ENCOUNTER — Ambulatory Visit: Payer: Self-pay | Admitting: Oncology

## 2014-09-21 ENCOUNTER — Other Ambulatory Visit: Payer: Self-pay

## 2014-09-21 ENCOUNTER — Other Ambulatory Visit: Payer: Self-pay | Admitting: *Deleted

## 2014-09-21 ENCOUNTER — Ambulatory Visit: Payer: Self-pay

## 2014-09-21 ENCOUNTER — Ambulatory Visit (HOSPITAL_BASED_OUTPATIENT_CLINIC_OR_DEPARTMENT_OTHER): Payer: Medicare Other

## 2014-09-21 ENCOUNTER — Other Ambulatory Visit (HOSPITAL_BASED_OUTPATIENT_CLINIC_OR_DEPARTMENT_OTHER): Payer: Medicare Other

## 2014-09-21 ENCOUNTER — Ambulatory Visit (HOSPITAL_BASED_OUTPATIENT_CLINIC_OR_DEPARTMENT_OTHER): Payer: Medicare Other | Admitting: Oncology

## 2014-09-21 ENCOUNTER — Ambulatory Visit (HOSPITAL_COMMUNITY)
Admission: RE | Admit: 2014-09-21 | Discharge: 2014-09-21 | Disposition: A | Discharge status: Home / Self Care | Payer: Medicare Other | Source: Ambulatory Visit | Attending: Oncology | Admitting: Oncology

## 2014-09-21 ENCOUNTER — Telehealth: Payer: Self-pay | Admitting: Family Medicine

## 2014-09-21 VITALS — BP 145/49 | HR 70 | Temp 98.5°F | Resp 17 | Ht 62.2 in | Wt 194.3 lb

## 2014-09-21 DIAGNOSIS — C50919 Malignant neoplasm of unspecified site of unspecified female breast: Secondary | ICD-10-CM

## 2014-09-21 DIAGNOSIS — I38 Endocarditis, valve unspecified: Secondary | ICD-10-CM | POA: Insufficient documentation

## 2014-09-21 DIAGNOSIS — N189 Chronic kidney disease, unspecified: Secondary | ICD-10-CM

## 2014-09-21 DIAGNOSIS — D631 Anemia in chronic kidney disease: Secondary | ICD-10-CM

## 2014-09-21 DIAGNOSIS — C50911 Malignant neoplasm of unspecified site of right female breast: Secondary | ICD-10-CM

## 2014-09-21 DIAGNOSIS — C7951 Secondary malignant neoplasm of bone: Secondary | ICD-10-CM

## 2014-09-21 DIAGNOSIS — Z452 Encounter for adjustment and management of vascular access device: Secondary | ICD-10-CM | POA: Diagnosis not present

## 2014-09-21 DIAGNOSIS — J189 Pneumonia, unspecified organism: Secondary | ICD-10-CM | POA: Diagnosis present

## 2014-09-21 DIAGNOSIS — Z853 Personal history of malignant neoplasm of breast: Secondary | ICD-10-CM | POA: Diagnosis not present

## 2014-09-21 DIAGNOSIS — E119 Type 2 diabetes mellitus without complications: Secondary | ICD-10-CM | POA: Insufficient documentation

## 2014-09-21 DIAGNOSIS — Z170 Estrogen receptor positive status [ER+]: Secondary | ICD-10-CM

## 2014-09-21 LAB — COMPREHENSIVE METABOLIC PANEL (CC13)
ALT: 20 U/L (ref 0–55)
AST: 17 U/L (ref 5–34)
Albumin: 2.8 g/dL — ABNORMAL LOW (ref 3.5–5.0)
Alkaline Phosphatase: 82 U/L (ref 40–150)
Anion Gap: 13 mEq/L — ABNORMAL HIGH (ref 3–11)
BUN: 24.7 mg/dL (ref 7.0–26.0)
CHLORIDE: 104 meq/L (ref 98–109)
CO2: 25 meq/L (ref 22–29)
CREATININE: 1 mg/dL (ref 0.6–1.1)
Calcium: 8.7 mg/dL (ref 8.4–10.4)
EGFR: 58 mL/min/{1.73_m2} — ABNORMAL LOW (ref >90)
GLUCOSE: 151 mg/dL — AB (ref 70–140)
Potassium: 3.9 mEq/L (ref 3.5–5.1)
SODIUM: 141 meq/L (ref 136–145)
Total Bilirubin: 0.57 mg/dL (ref 0.20–1.20)
Total Protein: 6 g/dL — ABNORMAL LOW (ref 6.4–8.3)

## 2014-09-21 LAB — CBC WITH DIFFERENTIAL/PLATELET
BASO%: 0.6 % (ref 0.0–2.0)
Basophils Absolute: 0 10*3/uL (ref 0.0–0.1)
EOS%: 2.5 % (ref 0.0–7.0)
Eosinophils Absolute: 0.2 10*3/uL (ref 0.0–0.5)
HCT: 29.6 % — ABNORMAL LOW (ref 34.8–46.6)
HGB: 9.7 g/dL — ABNORMAL LOW (ref 11.6–15.9)
LYMPH%: 10.1 % — AB (ref 14.0–49.7)
MCH: 29.4 pg (ref 25.1–34.0)
MCHC: 32.9 g/dL (ref 31.5–36.0)
MCV: 89.4 fL (ref 79.5–101.0)
MONO#: 0.3 10*3/uL (ref 0.1–0.9)
MONO%: 4.8 % (ref 0.0–14.0)
NEUT%: 82 % — ABNORMAL HIGH (ref 38.4–76.8)
NEUTROS ABS: 5.9 10*3/uL (ref 1.5–6.5)
Platelets: 132 10*3/uL — ABNORMAL LOW (ref 145–400)
RBC: 3.31 10*6/uL — AB (ref 3.70–5.45)
RDW: 20.8 % — ABNORMAL HIGH (ref 11.2–14.5)
WBC: 7.2 10*3/uL (ref 3.9–10.3)
lymph#: 0.7 10*3/uL — ABNORMAL LOW (ref 0.9–3.3)

## 2014-09-21 MED ORDER — DARBEPOETIN ALFA 200 MCG/0.4ML IJ SOSY
200.0000 ug | PREFILLED_SYRINGE | Freq: Once | INTRAMUSCULAR | Status: AC
  Administered 2014-09-21: 200 ug via SUBCUTANEOUS
  Filled 2014-09-21: qty 0.4

## 2014-09-21 MED ORDER — POTASSIUM CHLORIDE CRYS ER 20 MEQ PO TBCR: 40.0000 meq | EXTENDED_RELEASE_TABLET | Freq: Every day | ORAL | Status: DC

## 2014-09-21 MED ORDER — GABAPENTIN 100 MG PO CAPS: 100.0000 mg | ORAL_CAPSULE | Freq: Three times a day (TID) | ORAL | Status: DC

## 2014-09-21 MED ORDER — HEPARIN SOD (PORK) LOCK FLUSH 100 UNIT/ML IV SOLN
500.0000 [IU] | INTRAVENOUS | Status: AC | PRN
  Administered 2014-09-21: 500 [IU]
  Filled 2014-09-21: qty 5

## 2014-09-21 MED ORDER — SODIUM CHLORIDE 0.9 % IJ SOLN
10.0000 mL | INTRAMUSCULAR | Status: AC | PRN
  Administered 2014-09-21: 10 mL
  Filled 2014-09-21: qty 10

## 2014-09-21 NOTE — Patient Instructions (Signed)

## 2014-09-21 NOTE — Telephone Encounter (Signed)
OK to give verbal orders for PT/OT/nursing for pt.-thx

## 2014-09-21 NOTE — Progress Notes (Signed)
Patient ID: Brittney Cunningham, female   DOB: [birth year], 74 y.o.   MRN: [id number]  [place]  [phone]) [phone] [phone]) [phone]  OFFICE PROGRESS NOTE   ID: Brittney Cunningham   DOB: 12/11/1940  MR#: [id number]  [facility]#:[id number]   PCP: [name], MD SU: Rolm Bookbinder OTHER MD: Teena Dunk, Kyung Rudd, Kevin Supple, Peter Martinique  CHIEF COMPLAINT:  Metastatic Breast Cancer CURRENT TREATMENT: Exemestane, everolimus, aranesp, B-12, denosumab  HISTORY OF PRESENT ILLNESS: Brittney Cunningham was diagnosed with right breast carcinoma in May of 2011, a biopsy showing a grade 2 invasive ductal carcinoma, T2 NXM1, stage IV at presentation. There was bone only involvement. Tumor was strongly ER and PR positive, HER-2/neu negative, with an MIP-1 of 26%.  She was treated neoadjuvantly with letrozole and zoledronic acid beginning in June of 2011. She is status post right modified radical mastectomy in February 2012 for a ypT2 ypN2, grade 1 invasive ductal carcinoma. Margins were negative.   Her subsequent history is as detailed below   INTERVAL HISTORY: Brittney Cunningham returns today for follow up of her metastatic breast cancer accompanied by her husband. Since her last visit here she was admitted for what turned out to be severe pneumonitis most likely related to her everolimus. She did not respond to antibiotics. There was no evidence of a pulmonary embolus. It looks very much like lymphangitic spread of her tumor but it did improve significantly with withdrawal of the medication. She was discharged to a rehabilitation facility in [place] for 2 weeks and she is still doing physical therapy at home. She tells me her breathing is much better. She no longer coughs. She is not oxygen dependent. She has no pleurisy.  REVIEW OF SYSTEMS: Brittney Cunningham is still generally weak but getting better. She sleeps poorly. She is worried about her weight. She has problems with her gums. Sometimes she has palpitations.  These can occur at night. She thinks it may be related to her drinking a lot of soda over the weekend because her sugars were low. Her ankles swell. She has pain in her joints and difficulty walking. She is anxious. She denies depression. Her diabetes is well-controlled and she has been having some hypoglycemic problems in the morning. A detailed review of systems today was otherwise stable   PAST MEDICAL HISTORY: Past Medical History  Diagnosis Date  . 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 Depression   . GERD (gastroesophageal reflux disease)   . Hyperlipidemia   . Hypothyroidism   . Osteoarthritis   . Carotid artery stenosis     bilateral  . CAD (coronary artery disease)     medical therapy, old tot RCA, patent LAD, mod. severe ostial LCX stenosis--PCI would be too complex-med mgmt recommended.  . OSA (obstructive sleep apnea)   . Hypertension   . Anxiety   . Carotid artery occlusion   . Unspecified constipation 03/15/2012  . Bursitis   . Collagen vascular disease   . History of radiation therapy 10/31/12-11/13/12    rt&lt humerous 30Gy/39fx  . Valvular heart disease 07/11/2013  . Diarrhea 09/28/2013  . Chronic renal insufficiency, stage II (mild)     Borderline stage II/III, CrCl 50s/60s.  . History of blood transfusion   . Urinary tract bacterial infections   . Breast cancer 05/2009    Grade 2 invasive ductal carcinoma, T2 NXM1, stage IV at presentation. There was bone only involvement.   . Diabetes mellitus type  II     insulin  . Hypocalcemia 07/2014    required IV calcium in ED once and was admitted to hosp once.  ? secondary to [facility] and everolimus?    PAST SURGICAL HISTORY: Past Surgical History  Procedure Laterality Date  . Incisional breast biopsy      remoted left breast biopsy  . Cesarean section    . Other surgical history      GYN surgery  . Knee surgery      right knee surgery  . Carotid endarterectomy  2011    right carotid  .  Port-a-cath removal    . Modified radical mastectomy  Feb 2012    Right breast  . Carotid stent      left  . Cardiac catheterization  06/2013    old total occ. of RCA, moderately severe ostial LCX stenosis- medical therapy- would be complex PCI  . Carotid angiogram N/A 03/01/2011    Procedure: CAROTID ANGIOGRAM;  Surgeon: Conrad McDonald, MD;  Location: [facility] CATH LAB;  Service: Cardiovascular;  Laterality: N/A;  . Arch aortogram  03/01/2011    Procedure: ARCH AORTOGRAM;  Surgeon: Conrad Coarsegold, MD;  Location: [facility] CATH LAB;  Service: Cardiovascular;;  . Carotid stent insertion Left 03/15/2011    Procedure: CAROTID STENT INSERTION;  Surgeon: Serafina Mitchell, MD;  Location: [facility] CATH LAB;  Service: Cardiovascular;  Laterality: Left;  . Left heart catheterization with coronary angiogram N/A 07/07/2013    Procedure: LEFT HEART CATHETERIZATION WITH CORONARY ANGIOGRAM;  Surgeon: Blane Ohara, MD;  Location: [facility] CATH LAB;  Service: Cardiovascular;  Laterality: N/A;  . Carotid dopplers  05/2014    Patent ICAs, bilat ECA dz: no change compared to 2014    FAMILY HISTORY Family History  Problem Relation Age of Onset  . Arthritis    . Coronary artery disease      first degree relative  . Stroke      first degree relative  . Diabetes Mother   . Heart disease Mother   . Hypertension Mother   . Cancer Mother   . Deep vein thrombosis Mother   . Colon cancer Neg Hx   . Stomach cancer Neg Hx   . Cancer Father     Pancreatic cancer  . Breast cancer Paternal Aunt 2  The patient's father died from pancreatic cancer at the age of 54. The patient's mother died from complications of diabetes and heart disease at the age of 71. The patient has 2 sisters and 2 brothers. There is no history of breast cancer or ovarian cancer in the immediate family. One of the patient's paternal aunts out of 6 paternal aunts had breast cancer diagnosed in her 65s.    GYNECOLOGIC HISTORY: The patient is GX, P3. First pregnancy to  term age 17. She went through the change of life in her late 35s. She never took hormones.   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 Her husband, [name], is disabled secondary to a fall.  He has difficulty with walking. He used to work for [employer] previously. The patient's son, [name], lives in [place], and works for [employer]. Daughter, [name], lives in [place], and works for [employer]. Daughter, [name], lives in [place], [place], and works as a Geologist, engineering. The patient attends [employer].  ADVANCED DIRECTIVES: Not on file; During her hospitalization the patient agreed to a DO NOT RESUSCITATE order. However  that is now not current. Today (09/21/2014) I gave her the appropriate forms to consider and notarize at her discretion  HEALTH MAINTENANCE:  (Updated 04/23/2013) Social History  Substance Use Topics  . Smoking status: Former Smoker -- 1.00 packs/day for 20 years    Types: Cigarettes    Quit date: 01/31/1988  . Smokeless tobacco: Never Used  . Alcohol Use: No    Colonoscopy: Not on file  PAP: Not on file  Bone density: July 2011, Normal  Lipid panel: 06/26/2012  Allergies  Allergen Reactions  . Chlorhexidine Hives, Itching and Rash    This was most likely a CONTACT DERMATITIS versus true systemic allergic reaction  . Adhesive [Tape] Hives  . Codeine Swelling  . Latex Rash    Current Outpatient Prescriptions  Medication Sig Dispense Refill  . ACCU-CHEK AVIVA PLUS test strip CHECK BLOOD SUGAR TWICE A DAY FOR DX:250.00 100 each 11  . amLODipine (NORVASC) 10 MG tablet TAKE ONE TABLET BY MOUTH EVERY DAY (Patient taking differently: TAKE 10 MG BY MOUTH DAILY.) 30 tablet 6  . aspirin 81 MG chewable tablet Chew 81 mg by mouth daily.    Marland Kitchen atorvastatin (LIPITOR) 80 MG tablet Take 1 tablet (80 mg total) by mouth daily. 30 tablet 11  . Blood Glucose Monitoring Suppl (ACCU-CHEK NANO  SMARTVIEW) W/DEVICE KIT Use to check blood sugar twice a day. DX 250.00 1 kit 0  . calcium carbonate (TUMS) 500 MG chewable tablet Chew 1 tablet (200 mg of elemental calcium total) by mouth 3 (three) times daily with meals. 30 tablet 0  . carvedilol (COREG) 25 MG tablet Take 1 tablet (25 mg total) by mouth 2 (two) times daily with a meal. 60 tablet 6  . clopidogrel (PLAVIX) 75 MG tablet Take 1 tablet (75 mg total) by mouth daily. 30 tablet 8  . cyanocobalamin (,VITAMIN B-12,) 1000 MCG/ML injection Inject 1,000 mcg into the muscle every 30 (thirty) days.     . darbepoetin (ARANESP) 200 MCG/0.4ML SOLN Inject 200 mcg into the skin every 14 (fourteen) days.     Marland Kitchen exemestane (AROMASIN) 25 MG tablet Take 1 tablet (25 mg total) by mouth daily after breakfast. 30 tablet 2  . furosemide (LASIX) 80 MG tablet Take 1 tablet (80 mg total) by mouth 2 (two) times daily. 60 tablet 0  . gabapentin (NEURONTIN) 100 MG capsule Take 1 capsule (100 mg total) by mouth 3 (three) times daily. 90 capsule 2  . glycerin adult (GLYCERIN ADULT) 2 G SUPP Place 1 suppository rectally once as needed (constipation). 10 suppository 0  . insulin detemir (LEVEMIR) 100 UNIT/ML injection Inject 0.25 mLs (25 Units total) into the skin at bedtime. (Patient not taking: Reported on 09/11/2014) 10 mL 0  . insulin lispro (HUMALOG) 100 UNIT/ML injection Inject 0.15 mLs (15 Units total) into the skin 3 (three) times daily before meals. 20 mL 0  . isosorbide mononitrate (IMDUR) 60 MG 24 hr tablet Take 1 tablet (60 mg total) by mouth 2 (two) times daily. 60 tablet 11  . levothyroxine (SYNTHROID, LEVOTHROID) 137 MCG tablet Take 1 tablet (137 mcg total) by mouth daily before breakfast. 30 tablet 2  . Melatonin 3 MG TABS Take by mouth at bedtime.    Marland Kitchen morphine (MS CONTIN) 30 MG 12 hr tablet Take 1 tablet (30 mg total) by mouth 2 (two) times daily. 60 tablet 0  . nitroGLYCERIN (NITROSTAT) 0.4 MG SL tablet Place 1 tablet (0.4 mg total) under the tongue  every 5 (  five) minutes as needed for chest pain. 30 tablet 0  . omeprazole (PRILOSEC) 40 MG capsule Take 1 capsule (40 mg total) by mouth daily. (Patient not taking: Reported on 09/11/2014) 30 capsule 3  . ondansetron (ZOFRAN) 4 MG tablet Take 1 tablet (4 mg total) by mouth every 8 (eight) hours as needed for nausea or vomiting. 100 tablet 0  . pantoprazole (PROTONIX) 40 MG tablet Take 40 mg by mouth daily.    . polyethylene glycol (MIRALAX / GLYCOLAX) packet Take 17 g by mouth daily as needed for mild constipation.     . potassium chloride SA (K-DUR,KLOR-CON) 20 MEQ tablet Take 2 tablets (40 mEq total) by mouth daily. 60 tablet 0  . predniSONE (DELTASONE) 20 MG tablet Take 2 tablets (40 mg total) by mouth daily with breakfast. (Patient not taking: Reported on 09/11/2014) 60 tablet 0  . PREDNISONE PO Take by mouth. 30 mg daily    . prochlorperazine (COMPAZINE) 10 MG tablet Take 1 tablet (10 mg total) by mouth every 6 (six) hours as needed for nausea or vomiting. 30 tablet 1  . senna-docusate (SENOKOT-S) 8.6-50 MG per tablet Take 1 tablet by mouth at bedtime.     . [DISCONTINUED] simvastatin (ZOCOR) 40 MG tablet Take 40 mg by mouth every evening.     No current facility-administered medications for this visit.    OBJECTIVE: Elderly white woman  examined in a wheelchair cane Filed Vitals:   09/21/14 0934  BP: 145/49  Pulse: 70  Temp: 98.5 F (36.9 C)  Resp: 17     Body mass index is 35.3 kg/(m^2).    ECOG FS: 2 Filed Weights   09/21/14 0934  Weight: 194 lb 4.8 oz (88.134 kg)   Sclerae unicteric, EOMs intact Oropharynx clear and moist No cervical or supraclavicular adenopathy Lungs no rales or rhonchi Heart regular rate and rhythm Abd soft, obese, nontender, positive bowel sounds MSK kyphosis but no focal spinal tenderness Neuro: nonfocal, well oriented, appropriate affect Breasts: Deferred   LAB RESULTS: Lab Results  Component Value Date   WBC 7.2 09/21/2014   NEUTROABS 5.9  09/21/2014   HGB 9.7* 09/21/2014   HCT 29.6* 09/21/2014   MCV 89.4 09/21/2014   PLT 132* 09/21/2014      Chemistry      Component Value Date/Time   NA 140 09/08/2014 1024   NA 134* 09/03/2014 0350   K 3.6 09/08/2014 1024   K 3.5 09/03/2014 0350   CL 83* 09/03/2014 0350   CL 104 07/08/2012 1023   CO2 37* 09/08/2014 1024   CO2 37* 09/03/2014 0350   BUN 47.0* 09/08/2014 1024   BUN 46* 09/03/2014 0350   CREATININE 1.2* 09/08/2014 1024   CREATININE 1.58* 09/03/2014 0350   CREATININE 1.34* 08/10/2014 1145      Component Value Date/Time   CALCIUM 8.4 09/08/2014 1024   CALCIUM 7.6* 09/03/2014 0350   ALKPHOS 116 09/08/2014 1024   ALKPHOS 70 08/26/2014 0550   AST 17 09/08/2014 1024   AST 29 08/26/2014 0550   ALT 17 09/08/2014 1024   ALT 12* 08/26/2014 0550   BILITOT 0.45 09/08/2014 1024   BILITOT 0.6 08/26/2014 0550      STUDIES: Dg Chest 2 View  09/01/2014   CLINICAL DATA:  Shortness of breath and weakness, followup pneumonitis  EXAM: CHEST  2 VIEW  COMPARISON:  08/30/2014  FINDINGS: Improvement in diffuse patchy pulmonary airspace opacities bilaterally. Trace pleural effusions persist. Moderate enlargement of the cardiac silhouette is again  noted. Right-sided Port-A-Cath in place with tip over the cavoatrial junction.  IMPRESSION: Improving diffuse pulmonary airspace opacities which could reflect edema or pneumonitis or other alveolar filling processes.   Electronically Signed   By: Conchita Paris M.D.   On: 09/01/2014 09:32   Dg Chest 2 View  08/28/2014   CLINICAL DATA:  History of metastatic breast carcinoma with shortness of Breath  EXAM: CHEST - 2 VIEW  COMPARISON:  08/24/2014, 08/25/2014  FINDINGS: Cardiac shadow is stable. A right chest wall port is again seen in satisfactory position. Patchy infiltrates are again identified throughout both lungs. Diffuse bony metastatic disease is identified. Aortic calcifications are seen and stable.  IMPRESSION: Stable bilateral  infiltrates when compare with the prior exams.  Diffuse bony metastatic disease.   Electronically Signed   By: Inez Catalina M.D.   On: 08/28/2014 09:41   Dg Chest 2 View  08/24/2014   CLINICAL DATA:  Increasing shortness of breath, history of carcinoma of the breast with bone metastatic disease  EXAM: CHEST - 2 VIEW  COMPARISON:  08/10/2014  FINDINGS: Cardiac shadow is stable. Increasing infiltrates are noted within both lungs when compare with the prior exam. Diffuse bony metastatic disease is seen. A right chest wall port is again noted.  IMPRESSION: Increasing bilateral infiltrates.   Electronically Signed   By: Inez Catalina M.D.   On: 08/24/2014 10:44   Ct Chest Wo Contrast  08/25/2014   CLINICAL DATA:  Shortness of breath, pneumonia, mastectomy.  EXAM: CT CHEST WITHOUT CONTRAST  TECHNIQUE: Multidetector CT imaging of the chest was performed following the standard protocol without IV contrast.  COMPARISON:  Radiograph 08/24/2014, CT 07/15/2014  FINDINGS: Mediastinum/Nodes: There is no axillary or supraclavicular lymphadenopathy. Port in the right chest wall. No mediastinal hilar lymphadenopathy. No pericardial fluid. Esophagus normal. Coronary calcification and aortic calcification.  Lungs/Pleura: There is a diffuse dense airspace disease with air bronchograms involving the upper lobes and lower lobes. There is some consolidated nodularity in the lower lobes. For example 2 cm nodule at the left lung base on image 42, series 5. Small pleural effusions noted on the right.  Upper abdomen: Limited view of the liver, kidneys, pancreas are unremarkable. Normal adrenal glands.  Musculoskeletal: 10 sclerotic metastasis involving the spine sternum and ribs. No change.  IMPRESSION: 1. Dense bilateral airspace disease with differential including multifocal pneumonia, severe pulmonary edema, or lymphangitic spread of carcinoma. Consolidative nodularity at the left lung base. 2. Small right effusion. 3. Stable  sclerotic metastasis.   Electronically Signed   By: Suzy Bouchard M.D.   On: 08/25/2014 08:38   Dg Chest Port 1 View  08/30/2014   CLINICAL DATA:  Short of breath. Bilateral breast cancer. Valvular heart disease.  EXAM: PORTABLE CHEST - 1 VIEW  COMPARISON:  Radiographs 08/28/2014  FINDINGS: RIGHT port in place. Stable cardiac silhouette. There is interval increase in bilateral pulmonary airspace disease. Diffuse sclerotic metastasis.  IMPRESSION: Interval increase in bilateral pulmonary airspace disease.  Diffuse sclerotic metastasis.   Electronically Signed   By: Suzy Bouchard M.D.   On: 08/30/2014 10:06    ASSESSMENT: 74 y.o. [place] woman:   (1) Status post right breast biopsy in 05/2009 for a grade 2 invasive ductal carcinoma, T2 NX M1, Stage IV, with bone-only involvement, strongly estrogen and progesterone receptor-positive, HER-2-negative with an MIB-1 of 26%.   (2) Neoadjuvantly she received Letrozole and zoledronic acid beginning in June of 2011 and underwent right modified radical mastectomy February of 2012  for a ypT2 ypN2, grade 1 invasive ductal carcinoma with negative margins.   (3) She completed radiation therapy in August of 2012.   (4) She has continued on Letrozole but was switched from Zoledronic acid to Denosumab Delton See) because of concerns regarding her serum creatinine. Delton See was being given every 8 weeks.  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  (6) symptomatic anemia, with creatinine clearance < 60 cc/min; Darbepoietin Q14d started 11/03/2011; received Feraheme 01/05/2012  (7) On subcutaneous B-12 supplementation monthly.  (8) Pain in left upper extremity with osseous metastasis, osteoarthritis, tendinopathy, and bursitis as confirmed by recent MRI.  (9) Anemia, multifactorial with renal disease, on Aranesp monthly, increased to  every 2 weeks June 2016  (10)  letrozole was discontinued in September 2014 with evidence of disease progression. She was started on fulvestrant injections, first given on 10/17/2012; stopped 06/03/2014 with progression  (11) exemestane and everolimus started 07/08/2014  (a) everolimus discontinued August 2016 with pneumonitis   PLAN: I am delighted that Lilit's breathing is better. There was still the possibility that she was having lymphangitic spread of tumor but that appears not to have been the case. I am obtaining a chest x-ray today just to confirm.  Obviously we are stopping the everolimus. We are continuing the exemestane. She appears to be tolerating that well.  She is planning to visit her daughter in [place] and then to go to the beach for the holiday. We are going to see her again in October. At that point we can plan for restaging studies later this year. As of now we are continuing on exemestane alone.  I did encourage her to strongly consider a DO NOT RESUSCITATE order and gave her the appropriate documents 2 complete. She will let us know if she gets abdominal arise or if she wants Sr. notarized them at the next visit.  She knows to call for any problems that may develop before then.  Chauncey Cruel, MD  09/21/2014  9:45 AM

## 2014-09-21 NOTE — Telephone Encounter (Signed)
Spoke to [place] she needs order for PT, OT and nursing for pt. Please advise. Thanks.

## 2014-09-21 NOTE — Telephone Encounter (Signed)
Levada Dy from [facility] is taking over care of Brittney Cunningham as she has been d/c from [facility]. They would like to talk to Dr. Anitra Lauth about what orders they will need to accomondate her. Please call Levada Dy at [phone]

## 2014-09-21 NOTE — Telephone Encounter (Signed)
Brittney Cunningham advised and voiced understanding.

## 2014-09-22 ENCOUNTER — Telehealth: Payer: Self-pay | Admitting: *Deleted

## 2014-09-22 NOTE — Telephone Encounter (Signed)
OK to give verbal order for the things they requested.-thx

## 2014-09-22 NOTE — Telephone Encounter (Signed)
Brittney Cunningham with [facility] on 09/22/14 at 4:38pm requesting verbal order for PT 3 days a week x 4 weeks for Gait transfer, balance strengthening, and education for condition mgt and home exercise program. CB: [phone] Please advise. Thanks.

## 2014-09-23 NOTE — Telephone Encounter (Addendum)
Left message for Minna Merritts to call back.

## 2014-09-25 ENCOUNTER — Other Ambulatory Visit: Payer: Self-pay | Admitting: *Deleted

## 2014-09-25 ENCOUNTER — Encounter: Payer: Self-pay | Admitting: Family Medicine

## 2014-09-25 ENCOUNTER — Ambulatory Visit (INDEPENDENT_AMBULATORY_CARE_PROVIDER_SITE_OTHER): Payer: Medicare Other | Admitting: Family Medicine

## 2014-09-25 VITALS — BP 130/55 | HR 65 | Temp 98.8°F | Resp 16 | Ht 62.2 in | Wt 190.0 lb

## 2014-09-25 DIAGNOSIS — C7951 Secondary malignant neoplasm of bone: Secondary | ICD-10-CM

## 2014-09-25 DIAGNOSIS — E118 Type 2 diabetes mellitus with unspecified complications: Secondary | ICD-10-CM | POA: Diagnosis not present

## 2014-09-25 DIAGNOSIS — C50919 Malignant neoplasm of unspecified site of unspecified female breast: Secondary | ICD-10-CM

## 2014-09-25 DIAGNOSIS — R609 Edema, unspecified: Secondary | ICD-10-CM | POA: Diagnosis not present

## 2014-09-25 DIAGNOSIS — J189 Pneumonia, unspecified organism: Secondary | ICD-10-CM | POA: Diagnosis not present

## 2014-09-25 MED ORDER — PREDNISONE 5 MG PO TABS: ORAL_TABLET | ORAL | Status: DC

## 2014-09-25 MED ORDER — FUROSEMIDE 40 MG PO TABS: ORAL_TABLET | ORAL | Status: DC

## 2014-09-25 MED ORDER — EXEMESTANE 25 MG PO TABS: 25.0000 mg | ORAL_TABLET | Freq: Every day | ORAL | Status: DC

## 2014-09-25 NOTE — Progress Notes (Signed)
Pre visit review using our clinic review tool, if applicable. No additional management support is needed unless otherwise documented below in the visit note. 

## 2014-09-25 NOTE — Progress Notes (Signed)
OFFICE NOTE  09/25/2014  CC:  Chief Complaint  Patient presents with  . Follow-up     HPI: Patient is a 74 y.o. Caucasian female who is here for 2 week f/u acute resp failure with hypoxia, anemia (multifactorial), hypocalcemia of unclear etiology, DM2, and stage IV breast cancer. Recent CBC showed stable Hb and recent CMET showed normal values except glucose 151 and albumin 2.8 (stable).  Feeling great, off of oxygen for over 2 wks now. Doing PT at home 3 days per week---feels like everything is getting better.  DM 2: having some low glucoses lately, was 66 this morning. Levemir 25 U qhs, SS humalog supposed to be given at meals but glucoses have been too low to give this (<150 pre meal).    Currently she is on $Rem'10mg'NKBz$  predisone per day and takes this dosing for 5 more days, then takes $RemoveBe'5mg'ztAgIwFAj$  dosing x 7d, then stop.  Latest f/u with Dr. Griffith Citron was good: repeat CXR improved.  The consensus is that the resp failure was due to everolimus-induced pneumonitis and NOT due to lymphangitic spread of tumor nor due to CHF. Her lasix was cut back pretty recently from $RemoveBefor'80mg'dCtPvttmHupF$  bid to $Remo'40mg'ZFIim$  bid and she says her LE edema still continues to slowly improve.    Pertinent PMH:  Past medical, surgical, social, and family history reviewed and no changes are noted since last office visit.  MEDS:  Outpatient Prescriptions Prior to Visit  Medication Sig Dispense Refill  . ACCU-CHEK AVIVA PLUS test strip CHECK BLOOD SUGAR TWICE A DAY FOR DX:250.00 100 each 11  . amLODipine (NORVASC) 10 MG tablet TAKE ONE TABLET BY MOUTH EVERY DAY (Patient taking differently: TAKE 10 MG BY MOUTH DAILY.) 30 tablet 6  . aspirin 81 MG chewable tablet Chew 81 mg by mouth daily.    Marland Kitchen atorvastatin (LIPITOR) 80 MG tablet Take 1 tablet (80 mg total) by mouth daily. 30 tablet 11  . Blood Glucose Monitoring Suppl (ACCU-CHEK NANO SMARTVIEW) W/DEVICE KIT Use to check blood sugar twice a day. DX 250.00 1 kit 0  . calcium carbonate (TUMS) 500  MG chewable tablet Chew 1 tablet (200 mg of elemental calcium total) by mouth 3 (three) times daily with meals. 30 tablet 0  . carvedilol (COREG) 25 MG tablet Take 1 tablet (25 mg total) by mouth 2 (two) times daily with a meal. 60 tablet 6  . clopidogrel (PLAVIX) 75 MG tablet Take 1 tablet (75 mg total) by mouth daily. 30 tablet 8  . cyanocobalamin (,VITAMIN B-12,) 1000 MCG/ML injection Inject 1,000 mcg into the muscle every 30 (thirty) days.     . darbepoetin (ARANESP) 200 MCG/0.4ML SOLN Inject 200 mcg into the skin every 14 (fourteen) days.     . furosemide (LASIX) 80 MG tablet Take 1 tablet (80 mg total) by mouth 2 (two) times daily. 60 tablet 0  . gabapentin (NEURONTIN) 100 MG capsule Take 1 capsule (100 mg total) by mouth 3 (three) times daily. 90 capsule 2  . glycerin adult (GLYCERIN ADULT) 2 G SUPP Place 1 suppository rectally once as needed (constipation). 10 suppository 0  . insulin detemir (LEVEMIR) 100 UNIT/ML injection Inject 0.25 mLs (25 Units total) into the skin at bedtime. 10 mL 0  . insulin lispro (HUMALOG) 100 UNIT/ML injection Inject 0.15 mLs (15 Units total) into the skin 3 (three) times daily before meals. 20 mL 0  . isosorbide mononitrate (IMDUR) 60 MG 24 hr tablet Take 1 tablet (60 mg total) by mouth  2 (two) times daily. 60 tablet 11  . levothyroxine (SYNTHROID, LEVOTHROID) 137 MCG tablet Take 1 tablet (137 mcg total) by mouth daily before breakfast. 30 tablet 2  . Melatonin 3 MG TABS Take by mouth at bedtime.    Marland Kitchen morphine (MS CONTIN) 30 MG 12 hr tablet Take 1 tablet (30 mg total) by mouth 2 (two) times daily. 60 tablet 0  . nitroGLYCERIN (NITROSTAT) 0.4 MG SL tablet Place 1 tablet (0.4 mg total) under the tongue every 5 (five) minutes as needed for chest pain. 30 tablet 0  . omeprazole (PRILOSEC) 40 MG capsule Take 1 capsule (40 mg total) by mouth daily. 30 capsule 3  . ondansetron (ZOFRAN) 4 MG tablet Take 1 tablet (4 mg total) by mouth every 8 (eight) hours as needed for  nausea or vomiting. 100 tablet 0  . pantoprazole (PROTONIX) 40 MG tablet Take 40 mg by mouth daily.    . polyethylene glycol (MIRALAX / GLYCOLAX) packet Take 17 g by mouth daily as needed for mild constipation.     . potassium chloride SA (K-DUR,KLOR-CON) 20 MEQ tablet Take 2 tablets (40 mEq total) by mouth daily. 60 tablet 0  . prochlorperazine (COMPAZINE) 10 MG tablet Take 1 tablet (10 mg total) by mouth every 6 (six) hours as needed for nausea or vomiting. 30 tablet 1  . senna-docusate (SENOKOT-S) 8.6-50 MG per tablet Take 1 tablet by mouth at bedtime.     Marland Kitchen exemestane (AROMASIN) 25 MG tablet Take 1 tablet (25 mg total) by mouth daily after breakfast. 30 tablet 2  . predniSONE (DELTASONE) 20 MG tablet Take 2 tablets (40 mg total) by mouth daily with breakfast. (Patient not taking: Reported on 09/11/2014) 60 tablet 0  . PREDNISONE PO Take by mouth. 30 mg daily     No facility-administered medications prior to visit.    PE: Blood pressure 130/55, pulse 65, temperature 98.8 F (37.1 C), temperature source Oral, resp. rate 16, height 5' 2.2" (1.58 m), weight 190 lb (86.183 kg), last menstrual period 03/13/2012, SpO2 98 %. Gen: Alert, well appearing.  Patient is oriented to person, place, time, and situation. CV: RRR LUNGS: CTA bilat, nonlabored resps. EXT: 3+ doughy edema in both LL's, without skin breakdown or weeping or erythema.   LABS:  Lab Results  Component Value Date   WBC 7.2 09/21/2014   HGB 9.7* 09/21/2014   HCT 29.6* 09/21/2014   MCV 89.4 09/21/2014   PLT 132* 09/21/2014     Chemistry      Component Value Date/Time   NA 141 09/21/2014 0854   NA 134* 09/03/2014 0350   K 3.9 09/21/2014 0854   K 3.5 09/03/2014 0350   CL 83* 09/03/2014 0350   CL 104 07/08/2012 1023   CO2 25 09/21/2014 0854   CO2 37* 09/03/2014 0350   BUN 24.7 09/21/2014 0854   BUN 46* 09/03/2014 0350   CREATININE 1.0 09/21/2014 0854   CREATININE 1.58* 09/03/2014 0350   CREATININE 1.34* 08/10/2014  1145      Component Value Date/Time   CALCIUM 8.7 09/21/2014 0854   CALCIUM 7.6* 09/03/2014 0350   ALKPHOS 82 09/21/2014 0854   ALKPHOS 70 08/26/2014 0550   AST 17 09/21/2014 0854   AST 29 08/26/2014 0550   ALT 20 09/21/2014 0854   ALT 12* 08/26/2014 0550   BILITOT 0.57 09/21/2014 0854   BILITOT 0.6 08/26/2014 0550       IMPRESSION AND PLAN:  1) Pneumonitis/recent resp failure: resolving.  Pt  recovering nicely.  Tapering off steroids over the next 12 days (10 mg qd x 5d, then $Remove'5mg'lbACvip$  qd x 7d).  Rx for the 7 days of $Remov'5mg'ZPkHLD$  qd given for pt today.  2) DM 2:  Instructions: Today, decrease levemir to 20 Units at bedtime.  When you decrease your prednisone to one tab per day, decrease your levemir to 17 units at bedtime.  When you finish prednisone, decrease levemir to 15 Units at bedtime.   For now, I see no need for you to give yourself any humalog. Continue to monitor your sugar prior to each meal.  3) Chronic LE edema: continue lasix at 40 mg bid dosing, as this prob continues to gradually improve on this dosing.  4) Breast ca metastasized to bone: stable, continue treatment via Dr. Griffith Citron.  5) Multifactorial anemia (CRI, bone mets infiltration, anemia of chronic dz): stable.  6) Hypocalcemia: likely related to XGEVA use in recent past.  Resolved.  An After Visit Summary was printed and given to the patient.  FOLLOW UP: 3 weeks.

## 2014-09-25 NOTE — Telephone Encounter (Signed)
Brittney Cunningham and voiced understanding.

## 2014-09-25 NOTE — Patient Instructions (Signed)
Today, decrease levemir to 20 Units at bedtime.  When you decrease your prednisone to one tab per day, decrease your levemir to 17 units at bedtime.  When you finish prednisone, decrease levemir to 15 Units at bedtime.   For now, I see no need for you to give yourself any humalog. Continue to monitor your sugar prior to each meal.

## 2014-09-28 ENCOUNTER — Encounter: Payer: Self-pay | Admitting: Family Medicine

## 2014-10-01 ENCOUNTER — Telehealth: Payer: Self-pay | Admitting: *Deleted

## 2014-10-01 ENCOUNTER — Telehealth: Payer: Self-pay | Admitting: Family Medicine

## 2014-10-01 NOTE — Telephone Encounter (Signed)
No, sorry but she can't take advil b/c of some of the meds she is on and b/c of her mild kidney dysfunction. The only OTC med for pain that she can take is tylenol ([phone] mg every 6 hours as needed).-thx

## 2014-10-01 NOTE — Telephone Encounter (Signed)
Pt advised and voiced understanding. Rx put up front for pt to p/u. Pt also stated that her legs had been cramping and wanted to know if she should have her potassium rechecked. She had a CMP done on 09/21/14 and her potassium was normal, no need to recheck potassium. Per Dr. Anitra Lauth she can try tonic water 4oz. Pt advised and voiced understanding.

## 2014-10-01 NOTE — Telephone Encounter (Signed)
Pt would like to take advil for her knee pain. Is it ok to add this with her other meds. Please call [facility] and advise.

## 2014-10-01 NOTE — Telephone Encounter (Signed)
Please advise. Thanks.  

## 2014-10-01 NOTE — Telephone Encounter (Signed)
Rx written.

## 2014-10-01 NOTE — Telephone Encounter (Signed)
Pt LMOM on 10/01/14 at 10:25am. She stated that she was just released from rehab and the physical therapist recommended that she get a four wheel walker with a seat so she can rest when she is out and about. She stated that they did not write her a script for this before she left, she called them and they told her that she will need to contact her PCP. Spoke to pt and she stated that she does not have a preferred medical supply store, she stated that she can p/u the script on 10/12/14. Please advise.Thanks.

## 2014-10-02 ENCOUNTER — Inpatient Hospital Stay: Payer: Self-pay | Admitting: Adult Health

## 2014-10-02 NOTE — Telephone Encounter (Signed)
Pt advised and voiced understanding.   

## 2014-10-13 ENCOUNTER — Other Ambulatory Visit: Payer: Self-pay | Admitting: Family Medicine

## 2014-10-13 NOTE — Telephone Encounter (Signed)
RF request for levothyroxine LOV: 09/25/14 Next ov: 10/04/14 Last written: 06/10/14 #30 w/ 2RF TSH: 08/10/14 - 4.654  Please review, TSH slightly abnormal. Thanks.

## 2014-10-14 ENCOUNTER — Encounter: Payer: Self-pay | Admitting: Family Medicine

## 2014-10-14 ENCOUNTER — Ambulatory Visit (INDEPENDENT_AMBULATORY_CARE_PROVIDER_SITE_OTHER): Payer: Medicare Other | Admitting: Family Medicine

## 2014-10-14 VITALS — BP 149/74 | HR 64 | Temp 98.6°F | Resp 16 | Ht 62.2 in | Wt 187.0 lb

## 2014-10-14 DIAGNOSIS — E119 Type 2 diabetes mellitus without complications: Secondary | ICD-10-CM

## 2014-10-14 DIAGNOSIS — I872 Venous insufficiency (chronic) (peripheral): Secondary | ICD-10-CM

## 2014-10-14 DIAGNOSIS — Z23 Encounter for immunization: Secondary | ICD-10-CM | POA: Diagnosis not present

## 2014-10-14 DIAGNOSIS — E039 Hypothyroidism, unspecified: Secondary | ICD-10-CM | POA: Diagnosis not present

## 2014-10-14 DIAGNOSIS — D649 Anemia, unspecified: Secondary | ICD-10-CM

## 2014-10-14 MED ORDER — INSULIN LISPRO 100 UNIT/ML (KWIKPEN): PEN_INJECTOR | SUBCUTANEOUS | Status: DC

## 2014-10-14 MED ORDER — INSULIN DETEMIR 100 UNIT/ML ~~LOC~~ SOLN: 15.0000 [IU] | Freq: Every day | SUBCUTANEOUS | Status: DC

## 2014-10-14 NOTE — Progress Notes (Signed)
Pre visit review using our clinic review tool, if applicable. No additional management support is needed unless otherwise documented below in the visit note. 

## 2014-10-14 NOTE — Progress Notes (Signed)
OFFICE NOTE  10/14/2014  CC:  Chief Complaint  Patient presents with  . Follow-up    Pt is not fasting.      HPI: Patient is a 74 y.o. Caucasian female who has DM 2 and is here for 3 wk f/u DM. We have decreased her levemir and held her humalog due to low glucoses. Reports feeling well lately.  Currently at 15 U levemir and fastings are around 80-115 range.  After lunch and supper tend to be more around 200.  Taking no humalog--as instructed last visit. She finished her prednisone taper 7 days ago.    Breathing still feeling fine.  Taking lasix $RemoveBefo'40mg'HquAkWPVuoQ$  only once per day as of 09/21/14--cancer center visit.  Pertinent PMH:  Past medical, surgical, social, and family history reviewed and no changes are noted since last office visit.  MEDS:  Outpatient Prescriptions Prior to Visit  Medication Sig Dispense Refill  . ACCU-CHEK AVIVA PLUS test strip CHECK BLOOD SUGAR TWICE A DAY FOR DX:250.00 100 each 11  . amLODipine (NORVASC) 10 MG tablet TAKE ONE TABLET BY MOUTH EVERY DAY (Patient taking differently: TAKE 10 MG BY MOUTH DAILY.) 30 tablet 6  . aspirin 81 MG chewable tablet Chew 81 mg by mouth daily.    Marland Kitchen atorvastatin (LIPITOR) 80 MG tablet Take 1 tablet (80 mg total) by mouth daily. 30 tablet 11  . Blood Glucose Monitoring Suppl (ACCU-CHEK NANO SMARTVIEW) W/DEVICE KIT Use to check blood sugar twice a day. DX 250.00 1 kit 0  . calcium carbonate (TUMS) 500 MG chewable tablet Chew 1 tablet (200 mg of elemental calcium total) by mouth 3 (three) times daily with meals. 30 tablet 0  . carvedilol (COREG) 25 MG tablet Take 1 tablet (25 mg total) by mouth 2 (two) times daily with a meal. 60 tablet 6  . clopidogrel (PLAVIX) 75 MG tablet Take 1 tablet (75 mg total) by mouth daily. 30 tablet 8  . cyanocobalamin (,VITAMIN B-12,) 1000 MCG/ML injection Inject 1,000 mcg into the muscle every 30 (thirty) days.     . darbepoetin (ARANESP) 200 MCG/0.4ML SOLN Inject 200 mcg into the skin every 14 (fourteen)  days.     Marland Kitchen exemestane (AROMASIN) 25 MG tablet Take 1 tablet (25 mg total) by mouth daily after breakfast. 30 tablet 2  . furosemide (LASIX) 40 MG tablet 1 tab po bid 60 tablet 6  . gabapentin (NEURONTIN) 100 MG capsule Take 1 capsule (100 mg total) by mouth 3 (three) times daily. 90 capsule 2  . glycerin adult (GLYCERIN ADULT) 2 G SUPP Place 1 suppository rectally once as needed (constipation). 10 suppository 0  . insulin detemir (LEVEMIR) 100 UNIT/ML injection Inject 0.25 mLs (25 Units total) into the skin at bedtime. 10 mL 0  . isosorbide mononitrate (IMDUR) 60 MG 24 hr tablet Take 1 tablet (60 mg total) by mouth 2 (two) times daily. 60 tablet 11  . levothyroxine (SYNTHROID, LEVOTHROID) 137 MCG tablet Take 1 tablet (137 mcg total) by mouth daily before breakfast. 30 tablet 2  . Melatonin 3 MG TABS Take by mouth at bedtime.    Marland Kitchen morphine (MS CONTIN) 30 MG 12 hr tablet Take 1 tablet (30 mg total) by mouth 2 (two) times daily. 60 tablet 0  . nitroGLYCERIN (NITROSTAT) 0.4 MG SL tablet Place 1 tablet (0.4 mg total) under the tongue every 5 (five) minutes as needed for chest pain. 30 tablet 0  . omeprazole (PRILOSEC) 40 MG capsule Take 1 capsule (40 mg total)  by mouth daily. 30 capsule 3  . ondansetron (ZOFRAN) 4 MG tablet Take 1 tablet (4 mg total) by mouth every 8 (eight) hours as needed for nausea or vomiting. 100 tablet 0  . polyethylene glycol (MIRALAX / GLYCOLAX) packet Take 17 g by mouth daily as needed for mild constipation.     . potassium chloride SA (K-DUR,KLOR-CON) 20 MEQ tablet Take 2 tablets (40 mEq total) by mouth daily. 60 tablet 0  . prochlorperazine (COMPAZINE) 10 MG tablet Take 1 tablet (10 mg total) by mouth every 6 (six) hours as needed for nausea or vomiting. 30 tablet 1  . senna-docusate (SENOKOT-S) 8.6-50 MG per tablet Take 1 tablet by mouth at bedtime.     . insulin lispro (HUMALOG) 100 UNIT/ML injection Inject 0.15 mLs (15 Units total) into the skin 3 (three) times daily  before meals. 20 mL 0  . pantoprazole (PROTONIX) 40 MG tablet Take 40 mg by mouth daily.    . predniSONE (DELTASONE) 5 MG tablet 1 tab po qd x 7d (Patient not taking: Reported on 10/14/2014) 7 tablet 0   No facility-administered medications prior to visit.    PE: Blood pressure 149/74, pulse 64, temperature 98.6 F (37 C), temperature source Oral, resp. rate 16, height 5' 2.2" (1.58 m), weight 187 lb (84.823 kg), last menstrual period 03/13/2012, SpO2 97 %. Gen: Alert, well appearing.  Patient is oriented to person, place, time, and situation. LEGS: 3+ bilat LL pitting edema, with general warmth of lower legs and mild pinkish hue of anterior tibial surfaces. Nontender to touch.   LABS: Lab Results  Component Value Date   HGBA1C 7.9* 06/10/2014      Chemistry      Component Value Date/Time   NA 141 09/21/2014 0854   NA 134* 09/03/2014 0350   K 3.9 09/21/2014 0854   K 3.5 09/03/2014 0350   CL 83* 09/03/2014 0350   CL 104 07/08/2012 1023   CO2 25 09/21/2014 0854   CO2 37* 09/03/2014 0350   BUN 24.7 09/21/2014 0854   BUN 46* 09/03/2014 0350   CREATININE 1.0 09/21/2014 0854   CREATININE 1.58* 09/03/2014 0350   CREATININE 1.34* 08/10/2014 1145      Component Value Date/Time   CALCIUM 8.7 09/21/2014 0854   CALCIUM 7.6* 09/03/2014 0350   ALKPHOS 82 09/21/2014 0854   ALKPHOS 70 08/26/2014 0550   AST 17 09/21/2014 0854   AST 29 08/26/2014 0550   ALT 20 09/21/2014 0854   ALT 12* 08/26/2014 0550   BILITOT 0.57 09/21/2014 0854   BILITOT 0.6 08/26/2014 0550     Lab Results  Component Value Date   WBC 7.2 09/21/2014   HGB 9.7* 09/21/2014   HCT 29.6* 09/21/2014   MCV 89.4 09/21/2014   PLT 132* 09/21/2014   Lab Results  Component Value Date   TSH 4.654* 08/10/2014   IMPRESSION AND PLAN:  1) DM 2. Continue 15 U levemir qhs, and restart humalog kwikpen: 2 U w/BF, 3 U with lunch and 3U with supper. Continue home gluc monitoring 3-4 times per day. Recheck in office 3 wks  and we'll do A1c at that time.  2) LE venous insufficiency edema: increase lasix back to $Remov'40mg'DrExvR$  bid. Low Na diet, elevate legs prn.  3) Anemia, multifactorial (chronic dz, bone marrow infiltration with mets, CRI). She says she is getting another aranesp injection early next week at cancer center and will be getting lab work done at that time.  4) Hypothyroidism: most recent  TSH AFTER dose increase to 137 mcg qd was near normal so I did not change dose again. Will recheck TSH at next f/u in 3 wks.  5) Breast ca metastasized to bone: stable, f/u with Dr. Griffith Citron as scheduled.  6) Recent everolimus-induced pneumonitis: she continues to do well from a resp standpoint. No longer on systemic steroids. No new meds/investigations regarding this problem today.  An After Visit Summary was printed and given to the patient.  FOLLOW UP: 3 wks

## 2014-10-15 ENCOUNTER — Other Ambulatory Visit: Payer: Self-pay | Admitting: *Deleted

## 2014-10-15 DIAGNOSIS — C7951 Secondary malignant neoplasm of bone: Principal | ICD-10-CM

## 2014-10-15 DIAGNOSIS — C50911 Malignant neoplasm of unspecified site of right female breast: Secondary | ICD-10-CM

## 2014-10-15 MED ORDER — MORPHINE SULFATE ER 30 MG PO TBCR: 30.0000 mg | EXTENDED_RELEASE_TABLET | Freq: Two times a day (BID) | ORAL | Status: DC

## 2014-10-15 NOTE — Telephone Encounter (Signed)
Please refill as requested.

## 2014-10-15 NOTE — Telephone Encounter (Signed)
Request refill for Morphine.  "Discharged from nursing home with morphine script, didn't fill it until I used what I have at home and now it's two weeks too old for pharmacy to fill.  I'm out of medication, in pain and would like Dr. [name] to refill.  Please call [phone]."

## 2014-10-15 NOTE — Telephone Encounter (Signed)
Called pt to let her know her morphine prescription is ready. Pt reports feeling hot one minute, and cold the next, has not had pain medication since Tuesday; pt states she feels this way due to not having Morphine.  Generalized pain all over. No fever- has had diarrhea x 1 today. Able to eat/drink without difficulty. Reports having flu shot yesterday.  I had pt take temp over the phone, temp-97.0. Instructed pt to continue with small meals and fluids, may take imodium 1-2 tabs after each loose stool, no more than 8 tabs daily; instructed her to call us if condition changes or worsens, and to check temp 2x/day for the next couple of days. She voices understanding and states she will be here shortly for morphine script.

## 2014-10-19 ENCOUNTER — Other Ambulatory Visit: Payer: Self-pay

## 2014-10-19 ENCOUNTER — Ambulatory Visit: Payer: Self-pay

## 2014-10-20 ENCOUNTER — Ambulatory Visit (HOSPITAL_BASED_OUTPATIENT_CLINIC_OR_DEPARTMENT_OTHER): Payer: Medicare Other | Admitting: Oncology

## 2014-10-20 ENCOUNTER — Telehealth: Payer: Self-pay | Admitting: Oncology

## 2014-10-20 ENCOUNTER — Ambulatory Visit: Payer: Medicare Other

## 2014-10-20 ENCOUNTER — Other Ambulatory Visit: Payer: Self-pay

## 2014-10-20 ENCOUNTER — Ambulatory Visit (HOSPITAL_BASED_OUTPATIENT_CLINIC_OR_DEPARTMENT_OTHER): Payer: Medicare Other

## 2014-10-20 ENCOUNTER — Other Ambulatory Visit (HOSPITAL_BASED_OUTPATIENT_CLINIC_OR_DEPARTMENT_OTHER): Payer: Medicare Other

## 2014-10-20 VITALS — BP 124/46 | HR 64 | Temp 98.4°F | Resp 18 | Ht 62.0 in | Wt 181.0 lb

## 2014-10-20 DIAGNOSIS — C50911 Malignant neoplasm of unspecified site of right female breast: Secondary | ICD-10-CM

## 2014-10-20 DIAGNOSIS — N189 Chronic kidney disease, unspecified: Secondary | ICD-10-CM

## 2014-10-20 DIAGNOSIS — Z95828 Presence of other vascular implants and grafts: Secondary | ICD-10-CM

## 2014-10-20 DIAGNOSIS — C7951 Secondary malignant neoplasm of bone: Secondary | ICD-10-CM

## 2014-10-20 DIAGNOSIS — C50919 Malignant neoplasm of unspecified site of unspecified female breast: Secondary | ICD-10-CM

## 2014-10-20 DIAGNOSIS — M810 Age-related osteoporosis without current pathological fracture: Secondary | ICD-10-CM

## 2014-10-20 DIAGNOSIS — D631 Anemia in chronic kidney disease: Secondary | ICD-10-CM

## 2014-10-20 DIAGNOSIS — K219 Gastro-esophageal reflux disease without esophagitis: Secondary | ICD-10-CM

## 2014-10-20 LAB — COMPREHENSIVE METABOLIC PANEL (CC13)
ALT: 11 U/L (ref 0–55)
ANION GAP: 12 meq/L — AB (ref 3–11)
AST: 20 U/L (ref 5–34)
Albumin: 3.1 g/dL — ABNORMAL LOW (ref 3.5–5.0)
Alkaline Phosphatase: 88 U/L (ref 40–150)
BILIRUBIN TOTAL: 0.59 mg/dL (ref 0.20–1.20)
BUN: 39.5 mg/dL — AB (ref 7.0–26.0)
CHLORIDE: 99 meq/L (ref 98–109)
CO2: 23 meq/L (ref 22–29)
CREATININE: 1.5 mg/dL — AB (ref 0.6–1.1)
Calcium: 8.4 mg/dL (ref 8.4–10.4)
EGFR: 34 mL/min/{1.73_m2} — ABNORMAL LOW (ref >90)
GLUCOSE: 134 mg/dL (ref 70–140)
Potassium: 5.2 mEq/L — ABNORMAL HIGH (ref 3.5–5.1)
Sodium: 133 mEq/L — ABNORMAL LOW (ref 136–145)
TOTAL PROTEIN: 7.1 g/dL (ref 6.4–8.3)

## 2014-10-20 LAB — CBC WITH DIFFERENTIAL/PLATELET
BASO%: 0.7 % (ref 0.0–2.0)
Basophils Absolute: 0.1 10*3/uL (ref 0.0–0.1)
EOS%: 3.4 % (ref 0.0–7.0)
Eosinophils Absolute: 0.2 10*3/uL (ref 0.0–0.5)
HCT: 27.1 % — ABNORMAL LOW (ref 34.8–46.6)
HGB: 8.5 g/dL — ABNORMAL LOW (ref 11.6–15.9)
LYMPH#: 2.4 10*3/uL (ref 0.9–3.3)
LYMPH%: 34.1 % (ref 14.0–49.7)
MCH: 29.7 pg (ref 25.1–34.0)
MCHC: 31.4 g/dL — AB (ref 31.5–36.0)
MCV: 94.8 fL (ref 79.5–101.0)
MONO#: 0.7 10*3/uL (ref 0.1–0.9)
MONO%: 9.5 % (ref 0.0–14.0)
NEUT%: 52.3 % (ref 38.4–76.8)
NEUTROS ABS: 3.7 10*3/uL (ref 1.5–6.5)
PLATELETS: 206 10*3/uL (ref 145–400)
RBC: 2.86 10*6/uL — AB (ref 3.70–5.45)
RDW: 20.1 % — ABNORMAL HIGH (ref 11.2–14.5)
WBC: 7.1 10*3/uL (ref 3.9–10.3)

## 2014-10-20 LAB — TECHNOLOGIST REVIEW

## 2014-10-20 MED ORDER — CYANOCOBALAMIN 1000 MCG/ML IJ SOLN
1000.0000 ug | Freq: Once | INTRAMUSCULAR | Status: AC
  Administered 2014-10-20: 1000 ug via INTRAMUSCULAR

## 2014-10-20 MED ORDER — DENOSUMAB 120 MG/1.7ML ~~LOC~~ SOLN
120.0000 mg | Freq: Once | SUBCUTANEOUS | Status: AC
  Administered 2014-10-20: 120 mg via SUBCUTANEOUS
  Filled 2014-10-20: qty 1.7

## 2014-10-20 MED ORDER — EXEMESTANE 25 MG PO TABS: 25.0000 mg | ORAL_TABLET | Freq: Every day | ORAL | Status: DC

## 2014-10-20 MED ORDER — HEPARIN SOD (PORK) LOCK FLUSH 100 UNIT/ML IV SOLN
500.0000 [IU] | Freq: Once | INTRAVENOUS | Status: AC
  Administered 2014-10-20: 500 [IU] via INTRAVENOUS
  Filled 2014-10-20: qty 5

## 2014-10-20 MED ORDER — SODIUM CHLORIDE 0.9 % IJ SOLN
10.0000 mL | INTRAMUSCULAR | Status: DC | PRN
  Administered 2014-10-20: 10 mL via INTRAVENOUS
  Filled 2014-10-20: qty 10

## 2014-10-20 MED ORDER — FUROSEMIDE 40 MG PO TABS: 40.0000 mg | ORAL_TABLET | Freq: Two times a day (BID) | ORAL | Status: DC

## 2014-10-20 MED ORDER — DARBEPOETIN ALFA 300 MCG/0.6ML IJ SOSY
300.0000 ug | PREFILLED_SYRINGE | Freq: Once | INTRAMUSCULAR | Status: AC
  Administered 2014-10-20: 300 ug via SUBCUTANEOUS
  Filled 2014-10-20: qty 0.6

## 2014-10-20 NOTE — Patient Instructions (Signed)

## 2014-10-20 NOTE — Telephone Encounter (Signed)
Appointments made and avs printed for patient °

## 2014-10-20 NOTE — Progress Notes (Signed)
Patient ID: Brittney Cunningham, female   DOB: Oct 16, 1940, 74 y.o.   MRN: [id number]  [place] [facility]  [phone] [phone]  OFFICE PROGRESS NOTE   ID: DERYA DETTMANN   DOB: 11-Sep-1940  MR#: [id number]  MEB#:[id number]   PCP: [name], MD SU: Rolm Bookbinder OTHER MD: Teena Dunk, Kyung Rudd, Kevin Supple, Peter Martinique  CHIEF COMPLAINT:  Metastatic Breast Cancer CURRENT TREATMENT: Exemestane daily, aranesp bimonthly, B-12 monthly, denosumab every 12 weeks  HISTORY OF PRESENT ILLNESS: Brittney Cunningham was diagnosed with right breast carcinoma in May of 2011, a biopsy showing a grade 2 invasive ductal carcinoma, T2 NXM1, stage IV at presentation. There was bone only involvement. Tumor was strongly ER and PR positive, HER-2/neu negative, with an MIP-1 of 26%.  She was treated neoadjuvantly with letrozole and zoledronic acid beginning in June of 2011. She is status post right modified radical mastectomy in February 2012 for a ypT2 ypN2, grade 1 invasive ductal carcinoma. Margins were negative.   Her subsequent history is as detailed below   INTERVAL HISTORY: Brittney Cunningham returns today for follow up of her metastatic breast cancer. The interval history is generally stable. She is on exemestane, with good tolerance. Hot flashes and night sweats are not a major issue. She is tolerating the every 3 month denosumab without recurrence of her osteonecrosis history. She is receiving Aranesp and B-12 shots and note that her most recent ferritin was elevated  REVIEW OF SYSTEMS: [name] is recovering well from her recent pneumonitis. About a week ago she developed a viral illness that lasted about 2 days, with aches and pains, and diarrhea. She never developed a fever. Those symptoms have largely resolved but they have left are little bit weaker and she lost some weight. She still has chronic back and joint pain of course. It is difficult for her to walk and she does use a walker. She  describes herself is moderately fatigued. Her ankles swell and recently her Lasix was doubled. That is helping as far as the edema is concerned. He is making her feel a little dehydrated however. She is still short of breath, but denies cough phlegm production or pleurisy. She tells me her blood sugars are fine. A detailed review of systems today was otherwise stable  PAST MEDICAL HISTORY: Past Medical History  Diagnosis Date  . 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 Depression   . GERD (gastroesophageal reflux disease)   . Hyperlipidemia   . Hypothyroidism   . Osteoarthritis   . Carotid artery stenosis     bilateral  . CAD (coronary artery disease)     medical therapy, old tot RCA, patent LAD, mod. severe ostial LCX stenosis--PCI would be too complex-med mgmt recommended.  . OSA (obstructive sleep apnea)   . Hypertension   . Anxiety   . Carotid artery occlusion   . Unspecified constipation 03/15/2012  . Bursitis   . Collagen vascular disease   . History of radiation therapy 10/31/12-11/13/12    rt&lt humerous 30Gy/24fx  . Valvular heart disease 07/11/2013  . Diarrhea 09/28/2013  . Chronic renal insufficiency, stage II (mild)     Borderline stage II/III, CrCl 50s/60s.  . History of blood transfusion   . Urinary tract bacterial infections   . Breast cancer 05/2009    Grade 2 invasive ductal carcinoma, T2 NXM1, stage IV at presentation. There was bone only involvement.   . Diabetes mellitus type II  insulin  . Hypocalcemia 07/2014    required IV calcium in ED once and was admitted to hosp once.  ? secondary to [facility] and everolimus?  . Drug-induced pneumonitis 08/2014    (Everolimus) CXR 09/21/14 improved; needs another CXR 2-3 wks later to make sure resolution occurs.    PAST SURGICAL HISTORY: Past Surgical History  Procedure Laterality Date  . Incisional breast biopsy      remoted left breast biopsy  . Cesarean section    . Other surgical history       GYN surgery  . Knee surgery      right knee surgery  . Carotid endarterectomy  2011    right carotid  . Port-a-cath removal    . Modified radical mastectomy  Feb 2012    Right breast  . Carotid stent      left  . Cardiac catheterization  06/2013    old total occ. of RCA, moderately severe ostial LCX stenosis- medical therapy- would be complex PCI  . Carotid angiogram N/A 03/01/2011    Procedure: CAROTID ANGIOGRAM;  Surgeon: Conrad Northampton, MD;  Location: [facility] CATH LAB;  Service: Cardiovascular;  Laterality: N/A;  . Arch aortogram  03/01/2011    Procedure: ARCH AORTOGRAM;  Surgeon: Conrad Lansdale, MD;  Location: [facility] CATH LAB;  Service: Cardiovascular;;  . Carotid stent insertion Left 03/15/2011    Procedure: CAROTID STENT INSERTION;  Surgeon: Serafina Mitchell, MD;  Location: [facility] CATH LAB;  Service: Cardiovascular;  Laterality: Left;  . Left heart catheterization with coronary angiogram N/A 07/07/2013    Procedure: LEFT HEART CATHETERIZATION WITH CORONARY ANGIOGRAM;  Surgeon: Blane Ohara, MD;  Location: [facility] CATH LAB;  Service: Cardiovascular;  Laterality: N/A;  . Carotid dopplers  05/2014    Patent ICAs, bilat ECA dz: no change compared to 2014    FAMILY HISTORY Family History  Problem Relation Age of Onset  . Arthritis    . Coronary artery disease      first degree relative  . Stroke      first degree relative  . Diabetes Mother   . Heart disease Mother   . Hypertension Mother   . Cancer Mother   . Deep vein thrombosis Mother   . Colon cancer Neg Hx   . Stomach cancer Neg Hx   . Cancer Father     Pancreatic cancer  . Breast cancer Paternal Aunt 10  The patient's father died from pancreatic cancer at the age of 36. The patient's mother died from complications of diabetes and heart disease at the age of 103. The patient has 2 sisters and 2 brothers. There is no history of breast cancer or ovarian cancer in the immediate family. One of the patient's paternal aunts out of 6 paternal aunts  had breast cancer diagnosed in her 63s.    GYNECOLOGIC HISTORY: The patient is GX, P3. First pregnancy to term age 15. She went through the change of life in her late 29s. She never took hormones.   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 Her husband, [name], is disabled secondary to a fall.  He has difficulty with walking. He used to work for [employer] previously. The patient's son, [name], lives in [place], and works for [employer]. Daughter, [name], lives in [place], and works for [employer]. Daughter, [name], lives in [place], [place], and works as a Geologist, engineering. The patient attends [employer].  ADVANCED DIRECTIVES: Not on file; During her hospitalization the patient agreed to a DO NOT RESUSCITATE order. However that is now not current. Today (09/21/2014) I gave her the appropriate forms to consider and notarize at her discretion  HEALTH MAINTENANCE:  (Updated 04/23/2013) Social History  Substance Use Topics  . Smoking status: Former Smoker -- 1.00 packs/day for 20 years    Types: Cigarettes    Quit date: 01/31/1988  . Smokeless tobacco: Never Used  . Alcohol Use: No    Colonoscopy: Not on file  PAP: Not on file  Bone density: July 2011, Normal  Lipid panel: 06/26/2012  Allergies  Allergen Reactions  . Chlorhexidine Hives, Itching and Rash    This was most likely a CONTACT DERMATITIS versus true systemic allergic reaction  . Adhesive [Tape] Hives  . Codeine Swelling  . Latex Rash    Current Outpatient Prescriptions  Medication Sig Dispense Refill  . ACCU-CHEK AVIVA PLUS test strip CHECK BLOOD SUGAR TWICE A DAY FOR DX:250.00 100 each 11  . amLODipine (NORVASC) 10 MG tablet TAKE ONE TABLET BY MOUTH EVERY DAY (Patient taking differently: TAKE 10 MG BY MOUTH DAILY.) 30 tablet 6  . aspirin 81 MG chewable tablet Chew 81 mg by mouth daily.    Marland Kitchen atorvastatin (LIPITOR) 80 MG tablet  Take 1 tablet (80 mg total) by mouth daily. 30 tablet 11  . Blood Glucose Monitoring Suppl (ACCU-CHEK NANO SMARTVIEW) W/DEVICE KIT Use to check blood sugar twice a day. DX 250.00 1 kit 0  . calcium carbonate (TUMS) 500 MG chewable tablet Chew 1 tablet (200 mg of elemental calcium total) by mouth 3 (three) times daily with meals. 30 tablet 0  . carvedilol (COREG) 25 MG tablet Take 1 tablet (25 mg total) by mouth 2 (two) times daily with a meal. 60 tablet 6  . clopidogrel (PLAVIX) 75 MG tablet Take 1 tablet (75 mg total) by mouth daily. 30 tablet 8  . cyanocobalamin (,VITAMIN B-12,) 1000 MCG/ML injection Inject 1,000 mcg into the muscle every 30 (thirty) days.     . darbepoetin (ARANESP) 200 MCG/0.4ML SOLN Inject 200 mcg into the skin every 14 (fourteen) days.     Marland Kitchen exemestane (AROMASIN) 25 MG tablet Take 1 tablet (25 mg total) by mouth daily after breakfast. 30 tablet 2  . furosemide (LASIX) 40 MG tablet 1 tab po bid 60 tablet 6  . gabapentin (NEURONTIN) 100 MG capsule Take 1 capsule (100 mg total) by mouth 3 (three) times daily. 90 capsule 2  . glycerin adult (GLYCERIN ADULT) 2 G SUPP Place 1 suppository rectally once as needed (constipation). 10 suppository 0  . insulin detemir (LEVEMIR) 100 UNIT/ML injection Inject 0.15 mLs (15 Units total) into the skin at bedtime. 10 mL 0  . insulin lispro (HUMALOG KWIKPEN) 100 UNIT/ML KiwkPen 2 U SQ qAM, 3 U SQ qLUNCH, and 3 U SQ qSupper 15 mL 11  . isosorbide mononitrate (IMDUR) 60 MG 24 hr tablet Take 1 tablet (60 mg total) by mouth 2 (two) times daily. 60 tablet 11  . levothyroxine (SYNTHROID, LEVOTHROID) 137 MCG tablet TAKE ONE TABLET BY MOUTH EVERY DAY BEFORE BREAKFAST 30 tablet 6  . Melatonin 3 MG TABS Take by mouth at bedtime.    Marland Kitchen morphine (MS CONTIN) 30 MG 12 hr tablet Take 1 tablet (30 mg total) by mouth 2 (two) times daily. 60 tablet 0  . nitroGLYCERIN (NITROSTAT) 0.4 MG SL tablet Place 1 tablet (0.4 mg total) under the tongue every 5 (  five) minutes  as needed for chest pain. 30 tablet 0  . omeprazole (PRILOSEC) 40 MG capsule Take 1 capsule (40 mg total) by mouth daily. 30 capsule 3  . ondansetron (ZOFRAN) 4 MG tablet Take 1 tablet (4 mg total) by mouth every 8 (eight) hours as needed for nausea or vomiting. 100 tablet 0  . polyethylene glycol (MIRALAX / GLYCOLAX) packet Take 17 g by mouth daily as needed for mild constipation.     . potassium chloride SA (K-DUR,KLOR-CON) 20 MEQ tablet Take 2 tablets (40 mEq total) by mouth daily. 60 tablet 0  . prochlorperazine (COMPAZINE) 10 MG tablet Take 1 tablet (10 mg total) by mouth every 6 (six) hours as needed for nausea or vomiting. 30 tablet 1  . senna-docusate (SENOKOT-S) 8.6-50 MG per tablet Take 1 tablet by mouth at bedtime.     . [DISCONTINUED] simvastatin (ZOCOR) 40 MG tablet Take 40 mg by mouth every evening.     No current facility-administered medications for this visit.    OBJECTIVE: Elderly white woman who appears stated age 80 Vitals:   10/20/14 1109  BP: 124/46  Pulse: 64  Temp: 98.4 F (36.9 C)  Resp: 18     Body mass index is 33.1 kg/(m^2).    ECOG FS: 2 Filed Weights   10/20/14 1109  Weight: 181 lb (82.101 kg)   Sclerae unicteric, pupils round and equal Oropharynx clear and moist-- no thrush or other lesions No cervical or supraclavicular adenopathy Lungs no rales or rhonchi, no wheezes noted Heart regular rate and rhythm Abd soft, nontender, positive bowel sounds, ventral hernia as before MSK no focal spinal tenderness, no upper extremity lymphedema Neuro: nonfocal, well oriented, appropriate affect Breasts: Deferred     LAB RESULTS: Lab Results  Component Value Date   WBC 7.1 10/20/2014   NEUTROABS 3.7 10/20/2014   HGB 8.5* 10/20/2014   HCT 27.1* 10/20/2014   MCV 94.8 10/20/2014   PLT 206 10/20/2014      Chemistry      Component Value Date/Time   NA 141 09/21/2014 0854   NA 134* 09/03/2014 0350   K 3.9 09/21/2014 0854   K 3.5 09/03/2014 0350    CL 83* 09/03/2014 0350   CL 104 07/08/2012 1023   CO2 25 09/21/2014 0854   CO2 37* 09/03/2014 0350   BUN 24.7 09/21/2014 0854   BUN 46* 09/03/2014 0350   CREATININE 1.0 09/21/2014 0854   CREATININE 1.58* 09/03/2014 0350   CREATININE 1.34* 08/10/2014 1145      Component Value Date/Time   CALCIUM 8.7 09/21/2014 0854   CALCIUM 7.6* 09/03/2014 0350   ALKPHOS 82 09/21/2014 0854   ALKPHOS 70 08/26/2014 0550   AST 17 09/21/2014 0854   AST 29 08/26/2014 0550   ALT 20 09/21/2014 0854   ALT 12* 08/26/2014 0550   BILITOT 0.57 09/21/2014 0854   BILITOT 0.6 08/26/2014 0550      STUDIES: Dg Chest 2 View  09/21/2014   CLINICAL DATA:  Follow-up of pneumonitis, history of right breast malignancy metastatic to bone, coronary artery and valvular heart disease, diabetes.  EXAM: CHEST  2 VIEW  COMPARISON:  PA and lateral chest x-ray of September 01, 2014  FINDINGS: The lungs are well-expanded. Patchy increased interstitial and airspace opacities on the right and to a lesser extent on the left have resolved. There is no pleural effusion. The cardiac silhouette is mildly enlarged. The central pulmonary vascularity is prominent. There is bony sclerosis diffusely. There are  degenerative changes of the left shoulder. The Port-A-Cath appliance tip projects over the midportion of the SVC.  IMPRESSION: There has been interval improvement in the appearance of both lungs consistent with partial resolution of pneumonia. The sclerotic ribs extend an increased density to the thorax overall. There is no evidence of CHF.  An additional follow-up chest x-ray in 2-3 weeks is recommended to assess the lungs for further clearing.   Electronically Signed   By: David  Martinique CunninghamD.   On: 09/21/2014 12:46    ASSESSMENT: 74 y.o. [place] woman:   (1) Status post right breast biopsy in 05/2009 for a grade 2 invasive ductal carcinoma, T2 NX M1, Stage IV, with bone-only involvement, strongly estrogen and progesterone  receptor-positive, HER-2-negative with an MIB-1 of 26%.   (2) Neoadjuvantly she received Letrozole and zoledronic acid beginning in June of 2011 and underwent right modified radical mastectomy February of 2012 for a ypT2 ypN2, grade 1 invasive ductal carcinoma with negative margins.   (3) She completed radiation therapy in August of 2012.   (4) She has continued on Letrozole but was switched from Zoledronic acid to Denosumab Delton See) because of concerns regarding her serum creatinine. Delton See was being given every 8 weeks.  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  (6) symptomatic anemia, with creatinine clearance < 60 cc/min; Darbepoietin Q14d started 11/03/2011; received Feraheme 01/05/2012; ferritin August 2016 was greater than 800  (7) On subcutaneous B-12 supplementation monthly.  (8) Pain in left upper extremity with osseous metastasis, osteoarthritis, tendinopathy, and bursitis as confirmed by recent MRI.  (9) Anemia, multifactorial with renal disease, on Aranesp monthly, increased to every 2 weeks June 2016  (10)  letrozole was discontinued in September 2014 with evidence of disease progression. She was started on fulvestrant injections, first given on 10/17/2012; stopped 06/03/2014 with progression  (11) exemestane and everolimus started 07/08/2014  (a) everolimus discontinued August 2016 with pneumonitis   PLAN: Carrell is doing well from a breast cancer point of view. The severe pneumonitis she developed from the everolimus has largely resolved. She is tolerating exemestane well. As far as the breast cancer is concerned the plan is for her to have a CT scan of the chest before her return visit here in December.  She continues to work on her advanced directives but has not filled them out yet. She will bring them for [place] to notarize or 2 scan at the next  visit.  She remains anemic. I am upping the Aranesp to 300 every 2 weeks. We may need to go higher than that if we don't get a response in the next 6-8 weeks. I offered to transfuse her today but she "does not feel that bad" and will call us if she feels more short of breath, or has chest pain, palpitations, or dizziness.  We are doing the denosumab every 3 months because of her history of osteonecrosis of the jaw. This appears not to be active at present. She will receive a dose today.  Otherwise she is doing moderately well considering her multiple comorbidities. She knows to call for any problems that may develop before her next visit here.    Chauncey Cruel, MD  10/20/2014  11:51 AM

## 2014-10-23 ENCOUNTER — Other Ambulatory Visit: Payer: Self-pay | Admitting: *Deleted

## 2014-10-23 DIAGNOSIS — I739 Peripheral vascular disease, unspecified: Secondary | ICD-10-CM

## 2014-10-23 MED ORDER — CLOPIDOGREL BISULFATE 75 MG PO TABS: 75.0000 mg | ORAL_TABLET | Freq: Every day | ORAL | Status: AC

## 2014-10-23 MED ORDER — POTASSIUM CHLORIDE CRYS ER 20 MEQ PO TBCR: 40.0000 meq | EXTENDED_RELEASE_TABLET | Freq: Every day | ORAL | Status: DC

## 2014-10-26 ENCOUNTER — Ambulatory Visit (HOSPITAL_BASED_OUTPATIENT_CLINIC_OR_DEPARTMENT_OTHER): Payer: Medicare Other

## 2014-10-26 ENCOUNTER — Telehealth: Payer: Self-pay | Admitting: *Deleted

## 2014-10-26 DIAGNOSIS — C7951 Secondary malignant neoplasm of bone: Principal | ICD-10-CM

## 2014-10-26 DIAGNOSIS — C50919 Malignant neoplasm of unspecified site of unspecified female breast: Secondary | ICD-10-CM | POA: Diagnosis not present

## 2014-10-26 LAB — CBC WITH DIFFERENTIAL/PLATELET
BASO%: 0.5 % (ref 0.0–2.0)
Basophils Absolute: 0 10*3/uL (ref 0.0–0.1)
EOS%: 2.8 % (ref 0.0–7.0)
Eosinophils Absolute: 0.2 10*3/uL (ref 0.0–0.5)
HEMATOCRIT: 27.9 % — AB (ref 34.8–46.6)
HGB: 8.6 g/dL — ABNORMAL LOW (ref 11.6–15.9)
LYMPH%: 30.7 % (ref 14.0–49.7)
MCH: 29.8 pg (ref 25.1–34.0)
MCHC: 30.8 g/dL — AB (ref 31.5–36.0)
MCV: 96.5 fL (ref 79.5–101.0)
MONO#: 0.7 10*3/uL (ref 0.1–0.9)
MONO%: 11.1 % (ref 0.0–14.0)
NEUT%: 54.9 % (ref 38.4–76.8)
NEUTROS ABS: 3.4 10*3/uL (ref 1.5–6.5)
Platelets: 255 10*3/uL (ref 145–400)
RBC: 2.89 10*6/uL — AB (ref 3.70–5.45)
RDW: 20.6 % — AB (ref 11.2–14.5)
WBC: 6.1 10*3/uL (ref 3.9–10.3)
lymph#: 1.9 10*3/uL (ref 0.9–3.3)
nRBC: 0 % (ref 0–0)

## 2014-10-26 LAB — TECHNOLOGIST REVIEW: Technologist Review: 2

## 2014-10-26 NOTE — Telephone Encounter (Signed)
Received triage VM from 914am, retrieved 1150am; Mallory with [facility] stated pt is having dizziness with minimal exertion. BP values: 132/44, 130/50, 120/42. Called pt, she is requesting to have hgb checked. POF and lab order entered stat; I told pt not to leave until we have spoken to her and she agrees.

## 2014-10-26 NOTE — Progress Notes (Signed)
Patient came to the the breast center after a cbc was ordered by [facility] when patient called in with dizziness and fatigue.  Patient's hgb was 8.6 .  She decided to wait until the end of the week to see if her symptoms improve.  She states that she had a "stomach virus" and had diarrhea for three days, however she is feeling better, diarrhea has gone away.  Patient encouraged to call to update her status at the end of the week.

## 2014-10-27 ENCOUNTER — Telehealth: Payer: Self-pay | Admitting: Cardiology

## 2014-10-27 ENCOUNTER — Other Ambulatory Visit: Payer: Self-pay | Admitting: Oncology

## 2014-10-27 NOTE — Telephone Encounter (Signed)
Brittney Cunningham from [facility] HF program patient's last EF, relevant reported diagnoses for patient. She voiced thanks, no further questions.

## 2014-10-27 NOTE — Telephone Encounter (Signed)
Please f/u-- is patient still dizzy? Hb is stable

## 2014-10-30 ENCOUNTER — Telehealth: Payer: Self-pay | Admitting: Family Medicine

## 2014-10-30 ENCOUNTER — Encounter: Payer: Self-pay | Admitting: Family Medicine

## 2014-10-30 NOTE — Telephone Encounter (Signed)
Who reported these bp's?

## 2014-10-30 NOTE — Telephone Encounter (Signed)
Reporting that Brittney Cunningham BP on Sept 28 was 130/40 and she has had multi low BP's. These have been reported to her Cardiologist as well.

## 2014-10-30 NOTE — Telephone Encounter (Signed)
Spoke to pt she stated that no one has contacted her. Per Dr. Anitra Lauth see if pt has been feeling dizzy and if she has took her Lasix. Pt stated that she has been feeling a little lightheaded but not to bad. She stated that she has been taking the Lasixs and it has been helping with the swelling. She stated that she was just in to see Dr. Anitra Lauth and was feeling fine during her visit but later that day started feeling bad. Dr. Anitra Lauth advised. Per Dr. Anitra Lauth have pt hold Lasix Saturday and Sunday and restart Monday. Pt advised and voiced understanding.

## 2014-10-30 NOTE — Telephone Encounter (Signed)
FYI

## 2014-10-30 NOTE — Telephone Encounter (Signed)
Pls call pt and see if cardiology has advised anything regarding this report of her bp's being low.

## 2014-10-30 NOTE — Telephone Encounter (Signed)
Brittney Cunningham stated that the lady did not leave a name just wanted to let Dr. Anitra Lauth know and that she had already advised cardiology.

## 2014-11-05 ENCOUNTER — Ambulatory Visit (INDEPENDENT_AMBULATORY_CARE_PROVIDER_SITE_OTHER): Payer: Medicare Other | Admitting: Family Medicine

## 2014-11-05 ENCOUNTER — Encounter: Payer: Self-pay | Admitting: Family Medicine

## 2014-11-05 VITALS — BP 111/61 | HR 77 | Temp 97.9°F | Resp 16 | Ht 62.0 in | Wt 178.0 lb

## 2014-11-05 DIAGNOSIS — D649 Anemia, unspecified: Secondary | ICD-10-CM | POA: Diagnosis not present

## 2014-11-05 DIAGNOSIS — E039 Hypothyroidism, unspecified: Secondary | ICD-10-CM | POA: Diagnosis not present

## 2014-11-05 DIAGNOSIS — I872 Venous insufficiency (chronic) (peripheral): Secondary | ICD-10-CM

## 2014-11-05 DIAGNOSIS — E118 Type 2 diabetes mellitus with unspecified complications: Secondary | ICD-10-CM

## 2014-11-05 LAB — TSH: TSH: 12.88 u[IU]/mL — ABNORMAL HIGH (ref 0.35–4.50)

## 2014-11-05 LAB — HEMOGLOBIN A1C: Hgb A1c MFr Bld: 5.9 % (ref 4.6–6.5)

## 2014-11-05 MED ORDER — LEVOTHYROXINE SODIUM 150 MCG PO TABS: 150.0000 ug | ORAL_TABLET | Freq: Every day | ORAL | Status: DC

## 2014-11-05 NOTE — Progress Notes (Signed)
Pre visit review using our clinic review tool, if applicable. No additional management support is needed unless otherwise documented below in the visit note. 

## 2014-11-05 NOTE — Progress Notes (Signed)
OFFICE VISIT  11/08/2014   CC:  Chief Complaint  Patient presents with  . Follow-up    3 week f/u     HPI:    Patient is a 74 y.o. Caucasian female who presents for 3 week f/u DM 2, LE venous insufficiency edema, hypothyroidism. Hx of breast ca with bone mets: Most recent XGEVA, aranesp, and vit B12 were given 10/20/14 by oncology clinic.  The day of last visit here she went home and had 4-5d of diarrhea illness.  This caused fatigue, dizziness, some dehydration.  Her Hb check at that time (via hem/onc clinic) was stable.  She is not as tired now as she was then and no longer having orthostatic dizziness.  All diarrhea has resolved. Her stools are firm now.  She is eating and drinking.  DM: glucoses have not been over 200. Fasting: avg 120. Feet: feels constant burning and tingling on bottoms of both feet.  No current skin breakdown.      Past Medical History  Diagnosis Date  . 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 Depression   . GERD (gastroesophageal reflux disease)   . Hyperlipidemia   . Hypothyroidism   . Osteoarthritis   . Carotid artery stenosis     bilateral  . CAD (coronary artery disease)     medical therapy, old tot RCA, patent LAD, mod. severe ostial LCX stenosis--PCI would be too complex-med mgmt recommended.  . OSA (obstructive sleep apnea)   . Hypertension   . Anxiety   . Carotid artery occlusion   . Unspecified constipation 03/15/2012  . Bursitis   . Collagen vascular disease ([place])   . History of radiation therapy 10/31/12-11/13/12    rt&lt humerous 30Gy/27fx  . Valvular heart disease 07/11/2013  . Diarrhea 09/28/2013  . Chronic renal insufficiency, stage II (mild)     Borderline stage II/III, CrCl 50s/60s.  . History of blood transfusion   . Urinary tract bacterial infections   . Breast cancer ([place]) 05/2009    Grade 2 invasive ductal carcinoma, T2 NXM1, stage IV at presentation. There was bone only involvement.   .  Diabetes mellitus type II     insulin  . Hypocalcemia 07/2014    required IV calcium in ED once and was admitted to hosp once.  ? secondary to [facility] and everolimus?  . Drug-induced pneumonitis 08/2014    (Everolimus) CXR 09/21/14 improved; needs another CXR 2-3 wks later to make sure resolution occurs.    Past Surgical History  Procedure Laterality Date  . Incisional breast biopsy      remoted left breast biopsy  . Cesarean section    . Other surgical history      GYN surgery  . Knee surgery      right knee surgery  . Carotid endarterectomy  2011    right carotid  . Port-a-cath removal    . Modified radical mastectomy  Feb 2012    Right breast  . Carotid stent      left  . Cardiac catheterization  06/2013    old total occ. of RCA, moderately severe ostial LCX stenosis- medical therapy- would be complex PCI  . Carotid angiogram N/A 03/01/2011    Procedure: CAROTID ANGIOGRAM;  Surgeon: Conrad Edmond, MD;  Location: [facility] CATH LAB;  Service: Cardiovascular;  Laterality: N/A;  . Arch aortogram  03/01/2011    Procedure: ARCH AORTOGRAM;  Surgeon: Conrad Oceanport, MD;  Location:  [facility] CATH LAB;  Service: Cardiovascular;;  . Carotid stent insertion Left 03/15/2011    Procedure: CAROTID STENT INSERTION;  Surgeon: Serafina Mitchell, MD;  Location: [facility] CATH LAB;  Service: Cardiovascular;  Laterality: Left;  . Left heart catheterization with coronary angiogram N/A 07/07/2013    Procedure: LEFT HEART CATHETERIZATION WITH CORONARY ANGIOGRAM;  Surgeon: Blane Ohara, MD;  Location: [facility] CATH LAB;  Service: Cardiovascular;  Laterality: N/A;  . Carotid dopplers  05/2014    Patent ICAs, bilat ECA dz: no change compared to 2014  . Transthoracic echocardiogram  06/2013    EF 55-60%, grd I diast dysfxn, mod mitral regurg    Outpatient Prescriptions Prior to Visit  Medication Sig Dispense Refill  . ACCU-CHEK AVIVA PLUS test strip CHECK BLOOD SUGAR TWICE A DAY FOR DX:250.00 100 each 11  . amLODipine (NORVASC) 10 MG  tablet TAKE ONE TABLET BY MOUTH EVERY DAY (Patient taking differently: TAKE 10 MG BY MOUTH DAILY.) 30 tablet 6  . aspirin 81 MG chewable tablet Chew 81 mg by mouth daily.    Marland Kitchen atorvastatin (LIPITOR) 80 MG tablet Take 1 tablet (80 mg total) by mouth daily. 30 tablet 11  . Blood Glucose Monitoring Suppl (ACCU-CHEK NANO SMARTVIEW) W/DEVICE KIT Use to check blood sugar twice a day. DX 250.00 1 kit 0  . calcium carbonate (TUMS) 500 MG chewable tablet Chew 1 tablet (200 mg of elemental calcium total) by mouth 3 (three) times daily with meals. 30 tablet 0  . carvedilol (COREG) 25 MG tablet Take 1 tablet (25 mg total) by mouth 2 (two) times daily with a meal. 60 tablet 6  . clopidogrel (PLAVIX) 75 MG tablet Take 1 tablet (75 mg total) by mouth daily. 30 tablet 8  . cyanocobalamin (,VITAMIN B-12,) 1000 MCG/ML injection Inject 1,000 mcg into the muscle every 30 (thirty) days.     . darbepoetin (ARANESP) 200 MCG/0.4ML SOLN Inject 300 mcg into the skin every 14 (fourteen) days.    Marland Kitchen exemestane (AROMASIN) 25 MG tablet Take 1 tablet (25 mg total) by mouth daily after breakfast. 30 tablet 2  . furosemide (LASIX) 40 MG tablet Take 1 tablet (40 mg total) by mouth 2 (two) times daily. 1 tab po bid 60 tablet 6  . gabapentin (NEURONTIN) 100 MG capsule Take 1 capsule (100 mg total) by mouth 3 (three) times daily. 90 capsule 2  . glycerin adult (GLYCERIN ADULT) 2 G SUPP Place 1 suppository rectally once as needed (constipation). 10 suppository 0  . insulin detemir (LEVEMIR) 100 UNIT/ML injection Inject 0.15 mLs (15 Units total) into the skin at bedtime. 10 mL 0  . insulin lispro (HUMALOG KWIKPEN) 100 UNIT/ML KiwkPen 2 U SQ qAM, 3 U SQ qLUNCH, and 3 U SQ qSupper 15 mL 11  . isosorbide mononitrate (IMDUR) 60 MG 24 hr tablet Take 1 tablet (60 mg total) by mouth 2 (two) times daily. 60 tablet 11  . morphine (MS CONTIN) 30 MG 12 hr tablet Take 1 tablet (30 mg total) by mouth 2 (two) times daily. 60 tablet 0  . nitroGLYCERIN  (NITROSTAT) 0.4 MG SL tablet Place 1 tablet (0.4 mg total) under the tongue every 5 (five) minutes as needed for chest pain. 30 tablet 0  . omeprazole (PRILOSEC) 40 MG capsule Take 1 capsule (40 mg total) by mouth daily. 30 capsule 3  . ondansetron (ZOFRAN) 4 MG tablet Take 1 tablet (4 mg total) by mouth every 8 (eight) hours as needed for nausea or vomiting.  100 tablet 0  . polyethylene glycol (MIRALAX / GLYCOLAX) packet Take 17 g by mouth daily as needed for mild constipation.     . potassium chloride SA (K-DUR,KLOR-CON) 20 MEQ tablet Take 2 tablets (40 mEq total) by mouth daily. 60 tablet 3  . prochlorperazine (COMPAZINE) 10 MG tablet Take 1 tablet (10 mg total) by mouth every 6 (six) hours as needed for nausea or vomiting. 30 tablet 1  . senna-docusate (SENOKOT-S) 8.6-50 MG per tablet Take 1 tablet by mouth at bedtime.     Marland Kitchen levothyroxine (SYNTHROID, LEVOTHROID) 137 MCG tablet TAKE ONE TABLET BY MOUTH EVERY DAY BEFORE BREAKFAST 30 tablet 6   No facility-administered medications prior to visit.    Allergies  Allergen Reactions  . Chlorhexidine Hives, Itching and Rash    This was most likely a CONTACT DERMATITIS versus true systemic allergic reaction  . Adhesive [Tape] Hives  . Codeine Swelling  . Latex Rash    ROS As per HPI  PE: Blood pressure 111/61, pulse 77, temperature 97.9 F (36.6 C), temperature source Oral, resp. rate 16, height $RemoveBe'5\' 2"'unxJZWadu$  (1.575 m), weight 178 lb (80.74 kg), last menstrual period 03/13/2012, SpO2 98 %. Gen: Alert, well appearing.  Patient is oriented to person, place, time, and situation. Lower legs: 2+ pitting edema R, 1+ on L Foot exam - bilateral normal; no swelling, tenderness or skin or vascular lesions. Color and temperature is normal. Sensation is intact except she has no sensation with monofilament testing on her great toes bilat. Peripheral pulses are palpable but diminished. Toenails are normal.   LABS:    Chemistry      Component Value Date/Time    NA 133* 10/20/2014 1050   NA 134* 09/03/2014 0350   K 5.2* 10/20/2014 1050   K 3.5 09/03/2014 0350   CL 83* 09/03/2014 0350   CL 104 07/08/2012 1023   CO2 23 10/20/2014 1050   CO2 37* 09/03/2014 0350   BUN 39.5* 10/20/2014 1050   BUN 46* 09/03/2014 0350   CREATININE 1.5* 10/20/2014 1050   CREATININE 1.58* 09/03/2014 0350   CREATININE 1.34* 08/10/2014 1145      Component Value Date/Time   CALCIUM 8.4 10/20/2014 1050   CALCIUM 7.6* 09/03/2014 0350   ALKPHOS 88 10/20/2014 1050   ALKPHOS 70 08/26/2014 0550   AST 20 10/20/2014 1050   AST 29 08/26/2014 0550   ALT 11 10/20/2014 1050   ALT 12* 08/26/2014 0550   BILITOT 0.59 10/20/2014 1050   BILITOT 0.6 08/26/2014 0550     Lab Results  Component Value Date   HGBA1C 5.9 11/05/2014   Lab Results  Component Value Date   CHOL 134 03/02/2014   HDL 27.70* 03/02/2014   LDLCALC 76 03/02/2014   TRIG 151.0* 03/02/2014   CHOLHDL 5 03/02/2014   Lab Results  Component Value Date   WBC 6.1 10/26/2014   HGB 8.6* 10/26/2014   HCT 27.9* 10/26/2014   MCV 96.5 10/26/2014   PLT 255 10/26/2014   IMPRESSION AND PLAN:  1) DM 2: doing well on 15 U levemir qd, plus humalog 2-3-3. Continue home gluc monitoring 3-4 X/day. Recheck A1c today. Feet exam today: some DPN sensory loss evident.  2) LE venous insuff edema: stable.  Continue with lasix $RemoveBe'40mg'rjBfqhqEl$  bid, low Na diet, elevation of legs prn.  3) Anemia, multifactorial (chronic dz, bone marrow infiltration with mets, CRI): continue monitoring + aranesp as per hem/onc clinic.  She reports she is set to get labs and aranesp  11/16/15.  4) Hypothyroidism: due for TSH monitoring today.  5) Breast ca with mets to bones: cont w/plan as per hem/onc (Dr. [name]).  6) Prev health care: she will need prevnar 13 after 01/13/15.  An After Visit Summary was printed and given to the patient.  FOLLOW UP: Return in about 3 months (around 02/05/2015) for routine chronic illness f/u (30  min).

## 2014-11-06 ENCOUNTER — Ambulatory Visit: Payer: Medicare Other | Admitting: Family Medicine

## 2014-11-13 ENCOUNTER — Other Ambulatory Visit: Payer: Self-pay

## 2014-11-13 DIAGNOSIS — C7951 Secondary malignant neoplasm of bone: Principal | ICD-10-CM

## 2014-11-13 DIAGNOSIS — C50911 Malignant neoplasm of unspecified site of right female breast: Secondary | ICD-10-CM

## 2014-11-13 MED ORDER — MORPHINE SULFATE ER 30 MG PO TBCR: 30.0000 mg | EXTENDED_RELEASE_TABLET | Freq: Two times a day (BID) | ORAL | Status: DC

## 2014-11-13 NOTE — Telephone Encounter (Signed)
Patient called requesting a refill for morphine 30 mg.  Prescription filled and brought to the injection room.

## 2014-11-16 ENCOUNTER — Other Ambulatory Visit: Payer: Self-pay

## 2014-11-16 ENCOUNTER — Ambulatory Visit (HOSPITAL_BASED_OUTPATIENT_CLINIC_OR_DEPARTMENT_OTHER): Payer: Medicare Other

## 2014-11-16 ENCOUNTER — Other Ambulatory Visit (HOSPITAL_BASED_OUTPATIENT_CLINIC_OR_DEPARTMENT_OTHER): Payer: Medicare Other

## 2014-11-16 VITALS — BP 123/31 | HR 61 | Temp 98.3°F

## 2014-11-16 DIAGNOSIS — D649 Anemia, unspecified: Secondary | ICD-10-CM | POA: Diagnosis not present

## 2014-11-16 DIAGNOSIS — N189 Chronic kidney disease, unspecified: Secondary | ICD-10-CM | POA: Diagnosis not present

## 2014-11-16 DIAGNOSIS — D631 Anemia in chronic kidney disease: Secondary | ICD-10-CM

## 2014-11-16 DIAGNOSIS — Z452 Encounter for adjustment and management of vascular access device: Secondary | ICD-10-CM

## 2014-11-16 DIAGNOSIS — C50911 Malignant neoplasm of unspecified site of right female breast: Secondary | ICD-10-CM

## 2014-11-16 DIAGNOSIS — Z95828 Presence of other vascular implants and grafts: Secondary | ICD-10-CM

## 2014-11-16 DIAGNOSIS — C50912 Malignant neoplasm of unspecified site of left female breast: Secondary | ICD-10-CM

## 2014-11-16 DIAGNOSIS — C50012 Malignant neoplasm of nipple and areola, left female breast: Secondary | ICD-10-CM

## 2014-11-16 DIAGNOSIS — C7951 Secondary malignant neoplasm of bone: Secondary | ICD-10-CM

## 2014-11-16 DIAGNOSIS — C50919 Malignant neoplasm of unspecified site of unspecified female breast: Secondary | ICD-10-CM

## 2014-11-16 DIAGNOSIS — N390 Urinary tract infection, site not specified: Secondary | ICD-10-CM

## 2014-11-16 LAB — CBC WITH DIFFERENTIAL/PLATELET
BASO%: 0.9 % (ref 0.0–2.0)
BASOS ABS: 0.1 10*3/uL (ref 0.0–0.1)
EOS ABS: 0.2 10*3/uL (ref 0.0–0.5)
EOS%: 3.5 % (ref 0.0–7.0)
HCT: 26.8 % — ABNORMAL LOW (ref 34.8–46.6)
HEMOGLOBIN: 8.7 g/dL — AB (ref 11.6–15.9)
LYMPH%: 25.8 % (ref 14.0–49.7)
MCH: 30.9 pg (ref 25.1–34.0)
MCHC: 32.5 g/dL (ref 31.5–36.0)
MCV: 95.1 fL (ref 79.5–101.0)
MONO#: 0.5 10*3/uL (ref 0.1–0.9)
MONO%: 8.5 % (ref 0.0–14.0)
NEUT#: 3.5 10*3/uL (ref 1.5–6.5)
NEUT%: 61.3 % (ref 38.4–76.8)
PLATELETS: 224 10*3/uL (ref 145–400)
RBC: 2.82 10*6/uL — ABNORMAL LOW (ref 3.70–5.45)
RDW: 18.3 % — AB (ref 11.2–14.5)
WBC: 5.8 10*3/uL (ref 3.9–10.3)
lymph#: 1.5 10*3/uL (ref 0.9–3.3)

## 2014-11-16 LAB — COMPREHENSIVE METABOLIC PANEL (CC13)
ALBUMIN: 3.4 g/dL — AB (ref 3.5–5.0)
ALK PHOS: 93 U/L (ref 40–150)
ALT: 12 U/L (ref 0–55)
ANION GAP: 14 meq/L — AB (ref 3–11)
AST: 33 U/L (ref 5–34)
BILIRUBIN TOTAL: 0.44 mg/dL (ref 0.20–1.20)
BUN: 65.1 mg/dL — ABNORMAL HIGH (ref 7.0–26.0)
CALCIUM: 8.9 mg/dL (ref 8.4–10.4)
CO2: 23 mEq/L (ref 22–29)
Chloride: 100 mEq/L (ref 98–109)
Creatinine: 1.7 mg/dL — ABNORMAL HIGH (ref 0.6–1.1)
EGFR: 31 mL/min/{1.73_m2} — AB (ref >90)
GLUCOSE: 166 mg/dL — AB (ref 70–140)
POTASSIUM: 4.3 meq/L (ref 3.5–5.1)
SODIUM: 138 meq/L (ref 136–145)
Total Protein: 7.3 g/dL (ref 6.4–8.3)

## 2014-11-16 LAB — FERRITIN CHCC: FERRITIN: 833 ng/mL — AB (ref 9–269)

## 2014-11-16 MED ORDER — SODIUM CHLORIDE 0.9 % IJ SOLN
10.0000 mL | INTRAMUSCULAR | Status: DC | PRN
  Administered 2014-11-16: 10 mL via INTRAVENOUS
  Filled 2014-11-16: qty 10

## 2014-11-16 MED ORDER — DARBEPOETIN ALFA 300 MCG/0.6ML IJ SOSY
300.0000 ug | PREFILLED_SYRINGE | Freq: Once | INTRAMUSCULAR | Status: AC
  Administered 2014-11-16: 300 ug via SUBCUTANEOUS
  Filled 2014-11-16: qty 0.6

## 2014-11-16 MED ORDER — HEPARIN SOD (PORK) LOCK FLUSH 100 UNIT/ML IV SOLN
500.0000 [IU] | Freq: Once | INTRAVENOUS | Status: AC
  Administered 2014-11-16: 500 [IU] via INTRAVENOUS
  Filled 2014-11-16: qty 5

## 2014-11-16 MED ORDER — CYANOCOBALAMIN 1000 MCG/ML IJ SOLN
1000.0000 ug | Freq: Once | INTRAMUSCULAR | Status: AC
  Administered 2014-11-16: 1000 ug via INTRAMUSCULAR

## 2014-11-16 NOTE — Patient Instructions (Signed)

## 2014-11-17 LAB — CANCER ANTIGEN 27.29: CA 27.29: 189 U/mL — ABNORMAL HIGH (ref 0–39)

## 2014-11-18 ENCOUNTER — Other Ambulatory Visit: Payer: Self-pay | Admitting: *Deleted

## 2014-11-18 ENCOUNTER — Other Ambulatory Visit: Payer: Self-pay | Admitting: Nurse Practitioner

## 2014-11-19 ENCOUNTER — Telehealth: Payer: Self-pay | Admitting: Nurse Practitioner

## 2014-11-19 NOTE — Telephone Encounter (Signed)
Called and left a message with 10/31 appointments per pof and per pof and inbasket from heather  cancel 10/25 appointments

## 2014-11-20 ENCOUNTER — Telehealth: Payer: Self-pay | Admitting: *Deleted

## 2014-11-20 NOTE — Telephone Encounter (Signed)
TC from pt to verify her upcoming appts.  Pt had port flushed this week and will not need it on the 31st. This appt cancelled per pt request.

## 2014-11-23 ENCOUNTER — Other Ambulatory Visit: Payer: Self-pay | Admitting: *Deleted

## 2014-11-23 MED ORDER — CARVEDILOL 25 MG PO TABS: 25.0000 mg | ORAL_TABLET | Freq: Two times a day (BID) | ORAL | Status: DC

## 2014-11-24 ENCOUNTER — Other Ambulatory Visit: Payer: Self-pay

## 2014-11-24 ENCOUNTER — Ambulatory Visit: Payer: Self-pay | Admitting: Nurse Practitioner

## 2014-11-27 ENCOUNTER — Other Ambulatory Visit: Payer: Self-pay

## 2014-11-27 DIAGNOSIS — C50919 Malignant neoplasm of unspecified site of unspecified female breast: Secondary | ICD-10-CM

## 2014-11-27 DIAGNOSIS — C7951 Secondary malignant neoplasm of bone: Principal | ICD-10-CM

## 2014-11-27 DIAGNOSIS — C50911 Malignant neoplasm of unspecified site of right female breast: Secondary | ICD-10-CM

## 2014-11-30 ENCOUNTER — Other Ambulatory Visit (HOSPITAL_BASED_OUTPATIENT_CLINIC_OR_DEPARTMENT_OTHER): Payer: Medicare Other

## 2014-11-30 ENCOUNTER — Ambulatory Visit (HOSPITAL_BASED_OUTPATIENT_CLINIC_OR_DEPARTMENT_OTHER): Payer: Medicare Other

## 2014-11-30 VITALS — BP 123/36 | HR 72 | Temp 98.6°F

## 2014-11-30 DIAGNOSIS — D631 Anemia in chronic kidney disease: Secondary | ICD-10-CM | POA: Diagnosis not present

## 2014-11-30 DIAGNOSIS — C50919 Malignant neoplasm of unspecified site of unspecified female breast: Secondary | ICD-10-CM

## 2014-11-30 DIAGNOSIS — N184 Chronic kidney disease, stage 4 (severe): Secondary | ICD-10-CM

## 2014-11-30 DIAGNOSIS — C7951 Secondary malignant neoplasm of bone: Secondary | ICD-10-CM | POA: Diagnosis not present

## 2014-11-30 DIAGNOSIS — C50911 Malignant neoplasm of unspecified site of right female breast: Secondary | ICD-10-CM

## 2014-11-30 LAB — COMPREHENSIVE METABOLIC PANEL (CC13)
ALK PHOS: 115 U/L (ref 40–150)
ALT: 14 U/L (ref 0–55)
ANION GAP: 12 meq/L — AB (ref 3–11)
AST: 24 U/L (ref 5–34)
Albumin: 3.3 g/dL — ABNORMAL LOW (ref 3.5–5.0)
BILIRUBIN TOTAL: 0.37 mg/dL (ref 0.20–1.20)
BUN: 63.9 mg/dL — ABNORMAL HIGH (ref 7.0–26.0)
CO2: 26 meq/L (ref 22–29)
Calcium: 9.5 mg/dL (ref 8.4–10.4)
Chloride: 97 mEq/L — ABNORMAL LOW (ref 98–109)
Creatinine: 1.7 mg/dL — ABNORMAL HIGH (ref 0.6–1.1)
EGFR: 31 mL/min/{1.73_m2} — ABNORMAL LOW (ref >90)
Glucose: 201 mg/dl — ABNORMAL HIGH (ref 70–140)
Potassium: 4.9 mEq/L (ref 3.5–5.1)
Sodium: 136 mEq/L (ref 136–145)
TOTAL PROTEIN: 7.4 g/dL (ref 6.4–8.3)

## 2014-11-30 LAB — CBC WITH DIFFERENTIAL/PLATELET
BASO%: 0.6 % (ref 0.0–2.0)
Basophils Absolute: 0 10*3/uL (ref 0.0–0.1)
EOS ABS: 0.2 10*3/uL (ref 0.0–0.5)
EOS%: 2.9 % (ref 0.0–7.0)
HCT: 28.5 % — ABNORMAL LOW (ref 34.8–46.6)
HGB: 9.1 g/dL — ABNORMAL LOW (ref 11.6–15.9)
LYMPH%: 25.4 % (ref 14.0–49.7)
MCH: 30.7 pg (ref 25.1–34.0)
MCHC: 32 g/dL (ref 31.5–36.0)
MCV: 96 fL (ref 79.5–101.0)
MONO#: 0.5 10*3/uL (ref 0.1–0.9)
MONO%: 9.3 % (ref 0.0–14.0)
NEUT%: 61.8 % (ref 38.4–76.8)
NEUTROS ABS: 3.6 10*3/uL (ref 1.5–6.5)
PLATELETS: 235 10*3/uL (ref 145–400)
RBC: 2.97 10*6/uL — AB (ref 3.70–5.45)
RDW: 16.8 % — ABNORMAL HIGH (ref 11.2–14.5)
WBC: 5.8 10*3/uL (ref 3.9–10.3)
lymph#: 1.5 10*3/uL (ref 0.9–3.3)

## 2014-11-30 MED ORDER — DARBEPOETIN ALFA 300 MCG/0.6ML IJ SOSY
300.0000 ug | PREFILLED_SYRINGE | Freq: Once | INTRAMUSCULAR | Status: AC
  Administered 2014-11-30: 300 ug via SUBCUTANEOUS
  Filled 2014-11-30: qty 0.6

## 2014-12-11 ENCOUNTER — Other Ambulatory Visit: Payer: Self-pay

## 2014-12-11 DIAGNOSIS — C7951 Secondary malignant neoplasm of bone: Principal | ICD-10-CM

## 2014-12-11 DIAGNOSIS — C50919 Malignant neoplasm of unspecified site of unspecified female breast: Secondary | ICD-10-CM

## 2014-12-14 ENCOUNTER — Ambulatory Visit (HOSPITAL_BASED_OUTPATIENT_CLINIC_OR_DEPARTMENT_OTHER): Payer: Medicare Other

## 2014-12-14 ENCOUNTER — Other Ambulatory Visit (HOSPITAL_BASED_OUTPATIENT_CLINIC_OR_DEPARTMENT_OTHER): Payer: Medicare Other

## 2014-12-14 VITALS — BP 114/86 | HR 77 | Temp 98.2°F

## 2014-12-14 DIAGNOSIS — N189 Chronic kidney disease, unspecified: Secondary | ICD-10-CM

## 2014-12-14 DIAGNOSIS — C50919 Malignant neoplasm of unspecified site of unspecified female breast: Secondary | ICD-10-CM

## 2014-12-14 DIAGNOSIS — D631 Anemia in chronic kidney disease: Secondary | ICD-10-CM | POA: Diagnosis not present

## 2014-12-14 DIAGNOSIS — C50911 Malignant neoplasm of unspecified site of right female breast: Secondary | ICD-10-CM

## 2014-12-14 DIAGNOSIS — C7951 Secondary malignant neoplasm of bone: Secondary | ICD-10-CM

## 2014-12-14 DIAGNOSIS — K219 Gastro-esophageal reflux disease without esophagitis: Secondary | ICD-10-CM

## 2014-12-14 LAB — COMPREHENSIVE METABOLIC PANEL (CC13)
ALK PHOS: 98 U/L (ref 40–150)
ALT: 13 U/L (ref 0–55)
AST: 26 U/L (ref 5–34)
Albumin: 3.3 g/dL — ABNORMAL LOW (ref 3.5–5.0)
Anion Gap: 14 mEq/L — ABNORMAL HIGH (ref 3–11)
BUN: 67 mg/dL — AB (ref 7.0–26.0)
CHLORIDE: 101 meq/L (ref 98–109)
CO2: 25 meq/L (ref 22–29)
Calcium: 9.1 mg/dL (ref 8.4–10.4)
Creatinine: 1.8 mg/dL — ABNORMAL HIGH (ref 0.6–1.1)
EGFR: 29 mL/min/{1.73_m2} — ABNORMAL LOW (ref >90)
Glucose: 149 mg/dl — ABNORMAL HIGH (ref 70–140)
Potassium: 4.3 mEq/L (ref 3.5–5.1)
SODIUM: 140 meq/L (ref 136–145)
Total Bilirubin: 0.51 mg/dL (ref 0.20–1.20)
Total Protein: 7.2 g/dL (ref 6.4–8.3)

## 2014-12-14 LAB — CBC WITH DIFFERENTIAL/PLATELET
BASO%: 0.4 % (ref 0.0–2.0)
Basophils Absolute: 0 10*3/uL (ref 0.0–0.1)
EOS%: 2.8 % (ref 0.0–7.0)
Eosinophils Absolute: 0.2 10*3/uL (ref 0.0–0.5)
HCT: 31 % — ABNORMAL LOW (ref 34.8–46.6)
HGB: 9.6 g/dL — ABNORMAL LOW (ref 11.6–15.9)
LYMPH#: 1.5 10*3/uL (ref 0.9–3.3)
LYMPH%: 28.5 % (ref 14.0–49.7)
MCH: 30.5 pg (ref 25.1–34.0)
MCHC: 31 g/dL — AB (ref 31.5–36.0)
MCV: 98.4 fL (ref 79.5–101.0)
MONO#: 0.5 10*3/uL (ref 0.1–0.9)
MONO%: 8.7 % (ref 0.0–14.0)
NEUT#: 3.2 10*3/uL (ref 1.5–6.5)
NEUT%: 59.6 % (ref 38.4–76.8)
Platelets: 196 10*3/uL (ref 145–400)
RBC: 3.15 10*6/uL — AB (ref 3.70–5.45)
RDW: 15.7 % — ABNORMAL HIGH (ref 11.2–14.5)
WBC: 5.4 10*3/uL (ref 3.9–10.3)

## 2014-12-14 MED ORDER — CYANOCOBALAMIN 1000 MCG/ML IJ SOLN
1000.0000 ug | Freq: Once | INTRAMUSCULAR | Status: AC
  Administered 2014-12-14: 1000 ug via INTRAMUSCULAR

## 2014-12-14 MED ORDER — DARBEPOETIN ALFA 300 MCG/0.6ML IJ SOSY
300.0000 ug | PREFILLED_SYRINGE | Freq: Once | INTRAMUSCULAR | Status: AC
  Administered 2014-12-14: 300 ug via SUBCUTANEOUS
  Filled 2014-12-14: qty 0.6

## 2014-12-16 ENCOUNTER — Telehealth: Payer: Self-pay | Admitting: *Deleted

## 2014-12-16 ENCOUNTER — Other Ambulatory Visit: Payer: Self-pay

## 2014-12-16 DIAGNOSIS — C50911 Malignant neoplasm of unspecified site of right female breast: Secondary | ICD-10-CM

## 2014-12-16 DIAGNOSIS — C7951 Secondary malignant neoplasm of bone: Principal | ICD-10-CM

## 2014-12-16 MED ORDER — MORPHINE SULFATE ER 30 MG PO TBCR: 30.0000 mg | EXTENDED_RELEASE_TABLET | Freq: Two times a day (BID) | ORAL | Status: DC

## 2014-12-16 NOTE — Telephone Encounter (Signed)
TC from patient requesting refill on her MS Contin 30 mg. Last filled on 11/16/14. Please call patient when prescription ready for pick up.

## 2014-12-16 NOTE — Telephone Encounter (Signed)
Called patient to let her know that her prescription is ready for pick up.  Patient stated that she would be in on Friday to pick it up.

## 2014-12-18 ENCOUNTER — Other Ambulatory Visit: Payer: Self-pay

## 2014-12-21 ENCOUNTER — Other Ambulatory Visit (INDEPENDENT_AMBULATORY_CARE_PROVIDER_SITE_OTHER): Payer: Medicare Other

## 2014-12-21 DIAGNOSIS — I10 Essential (primary) hypertension: Secondary | ICD-10-CM

## 2014-12-21 DIAGNOSIS — E118 Type 2 diabetes mellitus with unspecified complications: Secondary | ICD-10-CM

## 2014-12-21 DIAGNOSIS — C50919 Malignant neoplasm of unspecified site of unspecified female breast: Secondary | ICD-10-CM

## 2014-12-21 DIAGNOSIS — D630 Anemia in neoplastic disease: Secondary | ICD-10-CM | POA: Diagnosis not present

## 2014-12-21 DIAGNOSIS — R5381 Other malaise: Secondary | ICD-10-CM

## 2014-12-21 DIAGNOSIS — E039 Hypothyroidism, unspecified: Secondary | ICD-10-CM

## 2014-12-21 DIAGNOSIS — C7951 Secondary malignant neoplasm of bone: Secondary | ICD-10-CM

## 2014-12-21 LAB — BASIC METABOLIC PANEL
BUN: 53 mg/dL — AB (ref 7–25)
CALCIUM: 8.5 mg/dL — AB (ref 8.6–10.4)
CO2: 25 mmol/L (ref 20–31)
Chloride: 96 mmol/L — ABNORMAL LOW (ref 98–110)
Creat: 1.37 mg/dL — ABNORMAL HIGH (ref 0.60–0.93)
GLUCOSE: 158 mg/dL — AB (ref 65–99)
Potassium: 4.1 mmol/L (ref 3.5–5.3)
SODIUM: 139 mmol/L (ref 135–146)

## 2014-12-21 LAB — TSH: TSH: 6.854 u[IU]/mL — AB (ref 0.350–4.500)

## 2014-12-22 LAB — CBC WITH DIFFERENTIAL/PLATELET
BASOS ABS: 0 10*3/uL (ref 0.0–0.1)
BASOS PCT: 0.2 % (ref 0.0–3.0)
EOS ABS: 0.2 10*3/uL (ref 0.0–0.7)
Eosinophils Relative: 3 % (ref 0.0–5.0)
HEMATOCRIT: 31 % — AB (ref 36.0–46.0)
HEMOGLOBIN: 10 g/dL — AB (ref 12.0–15.0)
LYMPHS PCT: 23.2 % (ref 12.0–46.0)
Lymphs Abs: 1.2 10*3/uL (ref 0.7–4.0)
MCHC: 32.1 g/dL (ref 30.0–36.0)
MCV: 96.6 fl (ref 78.0–100.0)
MONO ABS: 0.5 10*3/uL (ref 0.1–1.0)
Monocytes Relative: 8.5 % (ref 3.0–12.0)
Neutro Abs: 3.5 10*3/uL (ref 1.4–7.7)
Neutrophils Relative %: 65.1 % (ref 43.0–77.0)
Platelets: 220 10*3/uL (ref 150.0–400.0)
RBC: 3.21 Mil/uL — AB (ref 3.87–5.11)
RDW: 16.8 % — AB (ref 11.5–15.5)
WBC: 5.3 10*3/uL (ref 4.0–10.5)

## 2014-12-31 ENCOUNTER — Other Ambulatory Visit: Payer: Self-pay

## 2014-12-31 DIAGNOSIS — C7951 Secondary malignant neoplasm of bone: Principal | ICD-10-CM

## 2014-12-31 DIAGNOSIS — C50919 Malignant neoplasm of unspecified site of unspecified female breast: Secondary | ICD-10-CM

## 2014-12-31 MED ORDER — GABAPENTIN 100 MG PO CAPS: 100.0000 mg | ORAL_CAPSULE | Freq: Three times a day (TID) | ORAL | Status: DC

## 2015-01-07 ENCOUNTER — Other Ambulatory Visit: Payer: Self-pay

## 2015-01-07 DIAGNOSIS — C50919 Malignant neoplasm of unspecified site of unspecified female breast: Secondary | ICD-10-CM

## 2015-01-07 DIAGNOSIS — C50911 Malignant neoplasm of unspecified site of right female breast: Secondary | ICD-10-CM

## 2015-01-07 DIAGNOSIS — C7951 Secondary malignant neoplasm of bone: Principal | ICD-10-CM

## 2015-01-08 ENCOUNTER — Other Ambulatory Visit: Payer: Self-pay | Admitting: Oncology

## 2015-01-11 ENCOUNTER — Ambulatory Visit (HOSPITAL_BASED_OUTPATIENT_CLINIC_OR_DEPARTMENT_OTHER): Payer: Medicare Other

## 2015-01-11 ENCOUNTER — Other Ambulatory Visit (HOSPITAL_BASED_OUTPATIENT_CLINIC_OR_DEPARTMENT_OTHER): Payer: Medicare Other

## 2015-01-11 ENCOUNTER — Other Ambulatory Visit: Payer: Self-pay

## 2015-01-11 VITALS — BP 108/58 | HR 75 | Temp 98.3°F

## 2015-01-11 DIAGNOSIS — C50911 Malignant neoplasm of unspecified site of right female breast: Secondary | ICD-10-CM

## 2015-01-11 DIAGNOSIS — D631 Anemia in chronic kidney disease: Secondary | ICD-10-CM

## 2015-01-11 DIAGNOSIS — C7951 Secondary malignant neoplasm of bone: Secondary | ICD-10-CM | POA: Diagnosis not present

## 2015-01-11 DIAGNOSIS — M810 Age-related osteoporosis without current pathological fracture: Secondary | ICD-10-CM

## 2015-01-11 DIAGNOSIS — C50919 Malignant neoplasm of unspecified site of unspecified female breast: Secondary | ICD-10-CM

## 2015-01-11 DIAGNOSIS — N189 Chronic kidney disease, unspecified: Secondary | ICD-10-CM

## 2015-01-11 DIAGNOSIS — C50011 Malignant neoplasm of nipple and areola, right female breast: Secondary | ICD-10-CM

## 2015-01-11 LAB — COMPREHENSIVE METABOLIC PANEL
ALBUMIN: 3.2 g/dL — AB (ref 3.5–5.0)
ALK PHOS: 120 U/L (ref 40–150)
ALT: 9 U/L (ref 0–55)
AST: 27 U/L (ref 5–34)
Anion Gap: 16 mEq/L — ABNORMAL HIGH (ref 3–11)
BUN: 54.2 mg/dL — AB (ref 7.0–26.0)
CALCIUM: 9.6 mg/dL (ref 8.4–10.4)
CO2: 23 mEq/L (ref 22–29)
Chloride: 98 mEq/L (ref 98–109)
Creatinine: 1.5 mg/dL — ABNORMAL HIGH (ref 0.6–1.1)
EGFR: 34 mL/min/{1.73_m2} — ABNORMAL LOW (ref >90)
GLUCOSE: 191 mg/dL — AB (ref 70–140)
POTASSIUM: 4.7 meq/L (ref 3.5–5.1)
Sodium: 137 mEq/L (ref 136–145)
Total Bilirubin: 0.48 mg/dL (ref 0.20–1.20)
Total Protein: 7.4 g/dL (ref 6.4–8.3)

## 2015-01-11 LAB — CBC WITH DIFFERENTIAL/PLATELET
BASO%: 0.9 % (ref 0.0–2.0)
BASOS ABS: 0 10*3/uL (ref 0.0–0.1)
EOS ABS: 0.1 10*3/uL (ref 0.0–0.5)
EOS%: 2.3 % (ref 0.0–7.0)
HEMATOCRIT: 30.3 % — AB (ref 34.8–46.6)
HEMOGLOBIN: 9.6 g/dL — AB (ref 11.6–15.9)
LYMPH#: 1.6 10*3/uL (ref 0.9–3.3)
LYMPH%: 27.9 % (ref 14.0–49.7)
MCH: 30.2 pg (ref 25.1–34.0)
MCHC: 31.8 g/dL (ref 31.5–36.0)
MCV: 95 fL (ref 79.5–101.0)
MONO#: 0.5 10*3/uL (ref 0.1–0.9)
MONO%: 9.8 % (ref 0.0–14.0)
NEUT#: 3.3 10*3/uL (ref 1.5–6.5)
NEUT%: 59.1 % (ref 38.4–76.8)
PLATELETS: 211 10*3/uL (ref 145–400)
RBC: 3.19 10*6/uL — ABNORMAL LOW (ref 3.70–5.45)
RDW: 15.4 % — AB (ref 11.2–14.5)
WBC: 5.6 10*3/uL (ref 3.9–10.3)

## 2015-01-11 LAB — FERRITIN: Ferritin: 984 ng/ml — ABNORMAL HIGH (ref 9–269)

## 2015-01-11 LAB — CANCER ANTIGEN 27.29: CA 27.29: 177 U/mL — ABNORMAL HIGH (ref 0–39)

## 2015-01-11 MED ORDER — CYANOCOBALAMIN 1000 MCG/ML IJ SOLN
1000.0000 ug | Freq: Once | INTRAMUSCULAR | Status: AC
  Administered 2015-01-11: 1000 ug via INTRAMUSCULAR

## 2015-01-11 MED ORDER — DENOSUMAB 120 MG/1.7ML ~~LOC~~ SOLN
120.0000 mg | Freq: Once | SUBCUTANEOUS | Status: AC
  Administered 2015-01-11: 120 mg via SUBCUTANEOUS
  Filled 2015-01-11: qty 1.7

## 2015-01-11 MED ORDER — DARBEPOETIN ALFA 300 MCG/0.6ML IJ SOSY
300.0000 ug | PREFILLED_SYRINGE | Freq: Once | INTRAMUSCULAR | Status: AC
  Administered 2015-01-11: 300 ug via SUBCUTANEOUS
  Filled 2015-01-11: qty 0.6

## 2015-01-12 ENCOUNTER — Other Ambulatory Visit: Payer: Self-pay | Admitting: Oncology

## 2015-01-12 ENCOUNTER — Encounter (HOSPITAL_COMMUNITY): Payer: Self-pay

## 2015-01-12 ENCOUNTER — Ambulatory Visit (HOSPITAL_COMMUNITY)
Admission: RE | Admit: 2015-01-12 | Discharge: 2015-01-12 | Disposition: A | Discharge status: Home / Self Care | Payer: Medicare Other | Source: Ambulatory Visit | Attending: Oncology | Admitting: Oncology

## 2015-01-12 DIAGNOSIS — C7951 Secondary malignant neoplasm of bone: Secondary | ICD-10-CM | POA: Diagnosis present

## 2015-01-12 DIAGNOSIS — C50919 Malignant neoplasm of unspecified site of unspecified female breast: Secondary | ICD-10-CM

## 2015-01-12 DIAGNOSIS — D631 Anemia in chronic kidney disease: Secondary | ICD-10-CM | POA: Insufficient documentation

## 2015-01-12 DIAGNOSIS — R918 Other nonspecific abnormal finding of lung field: Secondary | ICD-10-CM | POA: Diagnosis not present

## 2015-01-12 DIAGNOSIS — K769 Liver disease, unspecified: Secondary | ICD-10-CM | POA: Diagnosis not present

## 2015-01-12 DIAGNOSIS — C50911 Malignant neoplasm of unspecified site of right female breast: Secondary | ICD-10-CM | POA: Diagnosis not present

## 2015-01-12 DIAGNOSIS — R911 Solitary pulmonary nodule: Secondary | ICD-10-CM | POA: Insufficient documentation

## 2015-01-12 DIAGNOSIS — I2510 Atherosclerotic heart disease of native coronary artery without angina pectoris: Secondary | ICD-10-CM | POA: Diagnosis not present

## 2015-01-12 DIAGNOSIS — N189 Chronic kidney disease, unspecified: Secondary | ICD-10-CM

## 2015-01-12 DIAGNOSIS — I7090 Unspecified atherosclerosis: Secondary | ICD-10-CM | POA: Insufficient documentation

## 2015-01-12 MED ORDER — IOHEXOL 300 MG/ML  SOLN
50.0000 mL | Freq: Once | INTRAMUSCULAR | Status: AC | PRN
  Administered 2015-01-12: 50 mL via INTRAVENOUS

## 2015-01-14 ENCOUNTER — Other Ambulatory Visit: Payer: Self-pay | Admitting: Oncology

## 2015-01-14 DIAGNOSIS — N184 Chronic kidney disease, stage 4 (severe): Principal | ICD-10-CM

## 2015-01-14 DIAGNOSIS — N189 Chronic kidney disease, unspecified: Secondary | ICD-10-CM

## 2015-01-14 DIAGNOSIS — D631 Anemia in chronic kidney disease: Secondary | ICD-10-CM | POA: Insufficient documentation

## 2015-01-18 ENCOUNTER — Other Ambulatory Visit: Payer: Self-pay | Admitting: *Deleted

## 2015-01-18 ENCOUNTER — Encounter: Payer: Self-pay | Admitting: Family Medicine

## 2015-01-18 ENCOUNTER — Ambulatory Visit (HOSPITAL_BASED_OUTPATIENT_CLINIC_OR_DEPARTMENT_OTHER): Payer: Medicare Other | Admitting: Oncology

## 2015-01-18 VITALS — BP 130/37 | HR 72 | Temp 98.1°F | Resp 18 | Ht 62.0 in | Wt 163.9 lb

## 2015-01-18 DIAGNOSIS — C7951 Secondary malignant neoplasm of bone: Secondary | ICD-10-CM

## 2015-01-18 DIAGNOSIS — D649 Anemia, unspecified: Secondary | ICD-10-CM | POA: Diagnosis not present

## 2015-01-18 DIAGNOSIS — R932 Abnormal findings on diagnostic imaging of liver and biliary tract: Secondary | ICD-10-CM

## 2015-01-18 DIAGNOSIS — C50911 Malignant neoplasm of unspecified site of right female breast: Secondary | ICD-10-CM | POA: Diagnosis not present

## 2015-01-18 DIAGNOSIS — C50811 Malignant neoplasm of overlapping sites of right female breast: Secondary | ICD-10-CM

## 2015-01-18 MED ORDER — MIRTAZAPINE 15 MG PO TABS: 15.0000 mg | ORAL_TABLET | Freq: Every day | ORAL | Status: DC

## 2015-01-18 MED ORDER — LEVOTHYROXINE SODIUM 150 MCG PO TABS: ORAL_TABLET | ORAL | Status: DC

## 2015-01-18 NOTE — Telephone Encounter (Signed)
RF request for levothyrxine LOV: 10/14/14 Next ov: 02/05/15  Last written: 11/05/14 #30 w/ 1RF  12/21/14 TSH 6.854  Please advise. Thanks.

## 2015-01-18 NOTE — Progress Notes (Signed)
Patient ID: Brittney Cunningham, female   DOB: Jun 15, 1940, 74 y.o.   MRN: [id number]  [place]  [phone] [phone]  OFFICE PROGRESS NOTE   ID: ZURIA FOSDICK   DOB: 1940/10/10  MR#: [id number]  HYI#:[id number]   PCP: [name], MD SU: Rolm Bookbinder OTHER MD: Teena Dunk, Kyung Rudd, Kevin Supple, Peter Martinique  CHIEF COMPLAINT:  Metastatic Breast Cancer CURRENT TREATMENT: Exemestane daily, aranesp bimonthly, B-12 monthly, denosumab every 12 weeks  HISTORY OF PRESENT ILLNESS:  from the earlier summary:  Brittney Cunningham was diagnosed with right breast carcinoma in May of 2011, a biopsy showing a grade 2 invasive ductal carcinoma, T2 NXM1, stage IV at presentation. There was bone only involvement. Tumor was strongly ER and PR positive, HER-2/neu negative, with an MIP-1 of 26%.  She was treated neoadjuvantly with letrozole and zoledronic acid beginning in June of 2011. She is status post right modified radical mastectomy in February 2012 for a ypT2 ypN2, grade 1 invasive ductal carcinoma. Margins were negative.   Her subsequent history is as detailed below   INTERVAL HISTORY: Brittney Cunningham returns today for follow up of her  Stage IV, estrogen receptor positive breast cancer.  She just had a restaging CT scan, which shows essentially clearing of the pneumonitis secondary to everolimus. However it does show some new liver lesions. The bone lesions are stable.  REVIEW OF SYSTEMS: Brittney Cunningham Think she is doing "pretty good", but the problem is her husband. There have been multiple falls. He has labile blood pressure and it is never clear up when he is going to faint. Basically he spends the day on the couch and she has to bring everything to him. I think it would be better if he were in a wheelchair instead and could roll himself around the house , which would also make him a little bit more independent. She tells me his mind is clear and therefore he should be able to  cooperate in this very significant lifestyle change. Because of that, she thinks, she is under a lot of stress and quite depressed. She has lost about 15 pounds since her husband's condition started to deteriorate. She tells me her appetite is decreased, so taste is okay, and she has rarely any nausea, usually in the morning before breakfast. She has normal bowel movements, with improved constipation problems. She has pain in her knees and back which are not new. She feels tired. She is sleeping "okay". She feels anxious and was tearful today.  Been no cough, phlegm production, or pleurisy.A detailed review of systems was otherwise stable  PAST MEDICAL HISTORY: Past Medical History  Diagnosis Date  . 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 Depression   . GERD (gastroesophageal reflux disease)   . Hyperlipidemia   . Hypothyroidism   . Osteoarthritis   . Carotid artery stenosis     bilateral  . CAD (coronary artery disease)     medical therapy, old tot RCA, patent LAD, mod. severe ostial LCX stenosis--PCI would be too complex-med mgmt recommended.  . OSA (obstructive sleep apnea)   . Hypertension   . Anxiety   . Carotid artery occlusion   . Unspecified constipation 03/15/2012  . Bursitis   . Collagen vascular disease ([place])   . History of radiation therapy 10/31/12-11/13/12    rt&lt humerous 30Gy/81f  . Valvular heart disease 07/11/2013  . Diarrhea 09/28/2013  . Chronic renal insufficiency, stage II (  mild)     Borderline stage II/III, CrCl 50s/60s.  . History of blood transfusion   . Urinary tract bacterial infections   . Breast cancer ([facility]) 05/2009    Grade 2 invasive ductal carcinoma, T2 NXM1, stage IV at presentation. There was bone only involvement.   . Diabetes mellitus type II     insulin  . Hypocalcemia 07/2014    required IV calcium in ED once and was admitted to hosp once.  ? secondary to [facility] and everolimus?  . Drug-induced pneumonitis 08/2014     (Everolimus) CXR 09/21/14 improved; needs another CXR 2-3 wks later to make sure resolution occurs.    PAST SURGICAL HISTORY: Past Surgical History  Procedure Laterality Date  . Incisional breast biopsy      remoted left breast biopsy  . Cesarean section    . Other surgical history      GYN surgery  . Knee surgery      right knee surgery  . Carotid endarterectomy  2011    right carotid  . Port-a-cath removal    . Modified radical mastectomy  Feb 2012    Right breast  . Carotid stent      left  . Cardiac catheterization  06/2013    old total occ. of RCA, moderately severe ostial LCX stenosis- medical therapy- would be complex PCI  . Carotid angiogram N/A 03/01/2011    Procedure: CAROTID ANGIOGRAM;  Surgeon: Conrad Clayton, MD;  Location: [facility] CATH LAB;  Service: Cardiovascular;  Laterality: N/A;  . Arch aortogram  03/01/2011    Procedure: ARCH AORTOGRAM;  Surgeon: Conrad [place], MD;  Location: [facility] CATH LAB;  Service: Cardiovascular;;  . Carotid stent insertion Left 03/15/2011    Procedure: CAROTID STENT INSERTION;  Surgeon: Serafina Mitchell, MD;  Location: [facility] CATH LAB;  Service: Cardiovascular;  Laterality: Left;  . Left heart catheterization with coronary angiogram N/A 07/07/2013    Procedure: LEFT HEART CATHETERIZATION WITH CORONARY ANGIOGRAM;  Surgeon: Blane Ohara, MD;  Location: [facility] CATH LAB;  Service: Cardiovascular;  Laterality: N/A;  . Carotid dopplers  05/2014    Patent ICAs, bilat ECA dz: no change compared to 2014  . Transthoracic echocardiogram  06/2013    EF 55-60%, grd I diast dysfxn, mod mitral regurg    FAMILY HISTORY Family History  Problem Relation Age of Onset  . Arthritis    . Coronary artery disease      first degree relative  . Stroke      first degree relative  . Diabetes Mother   . Heart disease Mother   . Hypertension Mother   . Cancer Mother   . Deep vein thrombosis Mother   . Colon cancer Neg Hx   . Stomach cancer Neg Hx   . Cancer Father      Pancreatic cancer  . Breast cancer Paternal Aunt 59  The patient's father died from pancreatic cancer at the age of 53. The patient's mother died from complications of diabetes and heart disease at the age of 38. The patient has 2 sisters and 2 brothers. There is no history of breast cancer or ovarian cancer in the immediate family. One of the patient's paternal aunts out of 6 paternal aunts had breast cancer diagnosed in her 17s.    GYNECOLOGIC HISTORY: The patient is GX, P3. First pregnancy to term age 29. She went through the change of life in her late 24s. She never took hormones.   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 Her husband, [name], is disabled secondary to a fall.  He has difficulty with walking. He used to work for [employer] previously. The patient's son, [name], lives in [place], and works for [employer]. Daughter, [name], lives in [place], and works for [employer]. Daughter, [name], lives in [place], [place], and works as a Geologist, engineering. The patient attends [employer].  ADVANCED DIRECTIVES: Not on file; During her hospitalization the patient agreed to a DO NOT RESUSCITATE order. However that is now not current. Today (09/21/2014) I gave her the appropriate forms to consider and notarize at her discretion  HEALTH MAINTENANCE:  (Updated 04/23/2013) Social History  Substance Use Topics  . Smoking status: Former Smoker -- 1.00 packs/day for 20 years    Types: Cigarettes    Quit date: 01/31/1988  . Smokeless tobacco: Never Used  . Alcohol Use: No    Colonoscopy: Not on file  PAP: Not on file  Bone density: July 2011, Normal  Lipid panel: 06/26/2012  Allergies  Allergen Reactions  . Chlorhexidine Hives, Itching and Rash    This was most likely a CONTACT DERMATITIS versus true systemic allergic reaction  . Adhesive [Tape] Hives  . Codeine Swelling  . Latex Rash    Current  Outpatient Prescriptions  Medication Sig Dispense Refill  . ACCU-CHEK AVIVA PLUS test strip CHECK BLOOD SUGAR TWICE A DAY FOR DX:250.00 100 each 11  . amLODipine (NORVASC) 10 MG tablet TAKE ONE TABLET BY MOUTH EVERY DAY (Patient taking differently: TAKE 10 MG BY MOUTH DAILY.) 30 tablet 6  . aspirin 81 MG chewable tablet Chew 81 mg by mouth daily.    Marland Kitchen atorvastatin (LIPITOR) 80 MG tablet Take 1 tablet (80 mg total) by mouth daily. 30 tablet 11  . Blood Glucose Monitoring Suppl (ACCU-CHEK NANO SMARTVIEW) W/DEVICE KIT Use to check blood sugar twice a day. DX 250.00 1 kit 0  . calcium carbonate (TUMS) 500 MG chewable tablet Chew 1 tablet (200 mg of elemental calcium total) by mouth 3 (three) times daily with meals. 30 tablet 0  . carvedilol (COREG) 25 MG tablet Take 1 tablet (25 mg total) by mouth 2 (two) times daily with a meal. 60 tablet 8  . clopidogrel (PLAVIX) 75 MG tablet Take 1 tablet (75 mg total) by mouth daily. 30 tablet 8  . cyanocobalamin (,VITAMIN B-12,) 1000 MCG/ML injection Inject 1,000 mcg into the muscle every 30 (thirty) days.     . darbepoetin (ARANESP) 200 MCG/0.4ML SOLN Inject 300 mcg into the skin every 14 (fourteen) days.    Marland Kitchen exemestane (AROMASIN) 25 MG tablet Take 1 tablet (25 mg total) by mouth daily after breakfast. 30 tablet 2  . furosemide (LASIX) 40 MG tablet Take 1 tablet (40 mg total) by mouth 2 (two) times daily. 1 tab po bid 60 tablet 6  . gabapentin (NEURONTIN) 100 MG capsule Take 1 capsule (100 mg total) by mouth 3 (three) times daily. 90 capsule 2  . glycerin adult (GLYCERIN ADULT) 2 G SUPP Place 1 suppository rectally once as needed (constipation). 10 suppository 0  . insulin detemir (LEVEMIR) 100 UNIT/ML injection Inject 0.15 mLs (15 Units total) into the skin at bedtime. 10 mL 0  . insulin lispro (HUMALOG KWIKPEN) 100 UNIT/ML KiwkPen 2 U SQ qAM, 3 U SQ qLUNCH, and 3 U SQ qSupper 15 mL 11  . isosorbide mononitrate (IMDUR) 60 MG 24 hr tablet Take 1 tablet (  60 mg  total) by mouth 2 (two) times daily. 60 tablet 11  . levothyroxine (SYNTHROID, LEVOTHROID) 150 MCG tablet Take 1 tablet (150 mcg total) by mouth daily. 30 tablet 1  . morphine (MS CONTIN) 30 MG 12 hr tablet Take 1 tablet (30 mg total) by mouth 2 (two) times daily. 60 tablet 0  . nitroGLYCERIN (NITROSTAT) 0.4 MG SL tablet Place 1 tablet (0.4 mg total) under the tongue every 5 (five) minutes as needed for chest pain. 30 tablet 0  . omeprazole (PRILOSEC) 40 MG capsule Take 1 capsule (40 mg total) by mouth daily. 30 capsule 3  . ondansetron (ZOFRAN) 4 MG tablet Take 1 tablet (4 mg total) by mouth every 8 (eight) hours as needed for nausea or vomiting. 100 tablet 0  . polyethylene glycol (MIRALAX / GLYCOLAX) packet Take 17 g by mouth daily as needed for mild constipation.     . potassium chloride SA (K-DUR,KLOR-CON) 20 MEQ tablet Take 2 tablets (40 mEq total) by mouth daily. 60 tablet 3  . prochlorperazine (COMPAZINE) 10 MG tablet Take 1 tablet (10 mg total) by mouth every 6 (six) hours as needed for nausea or vomiting. 30 tablet 1  . senna-docusate (SENOKOT-S) 8.6-50 MG per tablet Take 1 tablet by mouth at bedtime.     . [DISCONTINUED] simvastatin (ZOCOR) 40 MG tablet Take 40 mg by mouth every evening.     No current facility-administered medications for this visit.    OBJECTIVE: Elderly white woman who  Was tearful during today's visit Filed Vitals:   01/18/15 1527  BP: 130/37  Pulse: 72  Temp: 98.1 F (36.7 C)  Resp: 18     Body mass index is 29.97 kg/(m^2).    ECOG FS: 2 Filed Weights   01/18/15 1527  Weight: 163 lb 14.4 oz (74.345 kg)    exam today was deferred  LAB RESULTS: Lab Results  Component Value Date   WBC 5.6 01/11/2015   NEUTROABS 3.3 01/11/2015   HGB 9.6* 01/11/2015   HCT 30.3* 01/11/2015   MCV 95.0 01/11/2015   PLT 211 01/11/2015      Chemistry      Component Value Date/Time   NA 137 01/11/2015 0942   NA 139 12/21/2014 0810   K 4.7 01/11/2015 0942   K 4.1  12/21/2014 0810   CL 96* 12/21/2014 0810   CL 104 07/08/2012 1023   CO2 23 01/11/2015 0942   CO2 25 12/21/2014 0810   BUN 54.2* 01/11/2015 0942   BUN 53* 12/21/2014 0810   CREATININE 1.5* 01/11/2015 0942   CREATININE 1.37* 12/21/2014 0810   CREATININE 1.58* 09/03/2014 0350      Component Value Date/Time   CALCIUM 9.6 01/11/2015 0942   CALCIUM 8.5* 12/21/2014 0810   ALKPHOS 120 01/11/2015 0942   ALKPHOS 70 08/26/2014 0550   AST 27 01/11/2015 0942   AST 29 08/26/2014 0550   ALT 9 01/11/2015 0942   ALT 12* 08/26/2014 0550   BILITOT 0.48 01/11/2015 0942   BILITOT 0.6 08/26/2014 0550      STUDIES: Ct Chest W Contrast  01/12/2015  CLINICAL DATA:  74 year old female with history of right-sided breast cancer diagnosed in 2011 status post right mastectomy. Bone metastasis. EXAM: CT CHEST WITH CONTRAST TECHNIQUE: Multidetector CT imaging of the chest was performed during intravenous contrast administration. CONTRAST:  64m OMNIPAQUE IOHEXOL 300 MG/ML  SOLN COMPARISON:  Chest CT 08/25/2014. FINDINGS: Mediastinum/Lymph Nodes: Heart size is mildly enlarged. There is no significant pericardial fluid,  thickening or pericardial calcification. There is atherosclerosis of the thoracic aorta, the great vessels of the mediastinum and the coronary arteries, including calcified atherosclerotic plaque in the left main, left anterior descending, left circumflex and right coronary arteries. Calcifications of the aortic valve. Right-sided internal jugular single-lumen porta cath with tip terminating in the right atrium. No pathologically enlarged mediastinal, internal mammary or hilar lymph nodes. Esophagus is unremarkable in appearance. No axillary lymphadenopathy. Lungs/Pleura: 6 mm subpleural nodule in the anterior aspect of the right upper lobe (image 26 of series 5), similar to prior study 07/15/2014. Other small nodules are also noted associated with both major fissures, favored to represent tiny subpleural  lymph nodes measuring 2-4 mm in size bilaterally. 5 mm left lower lobe nodule (image 38 of series 5), slightly larger than prior study from 07/15/2014, at which point this nodule measured only 2 mm in retrospect. Plaque-like area of pleuroparenchymal thickening in the periphery of the right lower lobe measuring 2.2 x 0.6 cm (image 44 of series 5) slightly more apparent than prior examinations. The majority of the previously noted multifocal airspace disease has completely resolved compared to the prior examination, although there is a small amount of residual consolidation or atelectasis in the inferior segment of the lingula. Upper Abdomen: Innumerable poorly defined intermediate attenuation areas in the liver are now noted and new compared to prior examinations, highly concerning for widespread metastatic disease, with the largest of these lesions measuring up to 2.3 x 3.4 cm in the periphery of segment 4A (image 58 of series 2). Atherosclerosis in the visualized abdominal vasculature. Musculoskeletal/Soft Tissues: Again noted are diffuse predominantly sclerotic osseous lesions throughout the visualized axial and appendicular skeleton, compatible with widespread metastatic disease to the bone. Multiple healed or healing pathologic right-sided rib fractures are again noted. Status post right modified radical mastectomy. IMPRESSION: 1. Today's study demonstrates progression of disease, as evidenced by interval development of numerous new hepatic lesions which are highly concerning for metastatic disease to the liver. 2. Slight interval enlargement of a left lower lobe pulmonary nodule which measures only 5 mm, and enlargement of a plaque-like area of pleuroparenchymal thickening in the periphery of the right lower lobe measuring 2.2 x 0.6 cm (image 44 of series 5). The possibility of metastatic disease to the lungs is not excluded, and attention on followup studies is recommended. 3. Widespread metastatic disease to  the bones appear similar to prior examinations. 4. Atherosclerosis, including left main and 3 vessel coronary artery disease. 5. Additional incidental findings, as above. Electronically Signed   By: Vinnie Langton M.D.   On: 01/12/2015 10:55    ASSESSMENT: 74 y.o. Brittney Cunningham woman:   (1) Status post right breast biopsy in 05/2009 for a grade 2 invasive ductal carcinoma, T2 NX M1, Stage IV, with bone-only involvement, strongly estrogen and progesterone receptor-positive, HER-2-negative with an MIB-1 of 26%.   (2) Neoadjuvantly she received Letrozole and zoledronic acid beginning in June of 2011 and underwent right modified radical mastectomy February of 2012 for a ypT2 ypN2, grade 1 invasive ductal carcinoma with negative margins.   (3) She completed radiation therapy in August of 2012.   (4) She has continued on Letrozole but was switched from Zoledronic acid to Denosumab Delton See) because of concerns regarding her serum creatinine. Delton See was being given every 8 weeks.  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  (6) symptomatic anemia,  with creatinine clearance < 60 cc/min; Darbepoietin Q14d started 11/03/2011; received Feraheme 01/05/2012; ferritin August 2016 was greater than 800  (7) On subcutaneous B-12 supplementation monthly.  (8) Pain in left upper extremity with osseous metastasis, osteoarthritis, tendinopathy, and bursitis as confirmed by recent MRI.  (9) Anemia, multifactorial with renal disease, on Aranesp monthly, increased to every 2 weeks June 2016  (10)  letrozole was discontinued in September 2014 with evidence of disease progression. She was started on fulvestrant injections, first given on 10/17/2012; stopped 06/03/2014 with progression  (11) exemestane and everolimus started 07/08/2014  (a) everolimus discontinued August 2016 with  pneumonitis   PLAN: Brittney Cunningham's chest CT scan shows new spots in the liver. This is a very different development from having new bone spots , and it comes in the face of the fact that the bone lesions are entirely stable and her tumor marker CA-27-29 is dropping.    I do believe this is most likely her breast cancer in the liver, but she did recently have significant side effects from the everolimus with bilateral lung infiltrates and this could potentially be a similar development in the liver. Alternatively, her cancer, which has been estrogen receptor positive and HER-2 negative, could have changed and could now be estrogen receptor negative and/or HER-2 positive.   For these reasons I think the best thing to do at this point is to obtain a liver MRI which will serve as the new baseline and then proceed to liver biopsy. We will request a breast prognostic panel from that tissue. She will then return to see me the first week in January. Hopefully we will have all the information available by then. If this is still estrogen receptor positive then we will consider continuing exemestane, but adding Palbociclib.   If this is now estrogen receptor negative we will consider chemotherapy, possibly capecitabine or Aranesp.   I am concerned about her weight loss and she was very tearful today. I think she would benefit from Remeron and I'm starting her at 15 mg daily at bedtime. She will let me know if she has any problems that medication. We can consider upping the dose if she tolerates it well.   Finally I think as she loses weight she needs to have her blood pressure medications adjusted. I asked her to cut the dose in half of both her amlodipine and isosorbide   She knows to call for any problems that may develop before her next visit here.  Chauncey Cruel, MD  01/18/2015  4:13 PM

## 2015-01-19 ENCOUNTER — Other Ambulatory Visit: Payer: Self-pay

## 2015-01-19 ENCOUNTER — Telehealth: Payer: Self-pay

## 2015-01-19 DIAGNOSIS — M879 Osteonecrosis, unspecified: Secondary | ICD-10-CM

## 2015-01-19 DIAGNOSIS — C50911 Malignant neoplasm of unspecified site of right female breast: Secondary | ICD-10-CM

## 2015-01-19 DIAGNOSIS — C7951 Secondary malignant neoplasm of bone: Principal | ICD-10-CM

## 2015-01-19 DIAGNOSIS — M810 Age-related osteoporosis without current pathological fracture: Secondary | ICD-10-CM

## 2015-01-19 DIAGNOSIS — F411 Generalized anxiety disorder: Secondary | ICD-10-CM

## 2015-01-19 DIAGNOSIS — M8708 Idiopathic aseptic necrosis of bone, other site: Secondary | ICD-10-CM

## 2015-01-19 MED ORDER — MORPHINE SULFATE ER 30 MG PO TBCR: 30.0000 mg | EXTENDED_RELEASE_TABLET | Freq: Two times a day (BID) | ORAL | Status: DC

## 2015-01-19 MED ORDER — LORAZEPAM 0.5 MG PO TABS: ORAL_TABLET | ORAL | Status: DC

## 2015-01-19 NOTE — Telephone Encounter (Signed)
Per Dr. [name] patient can take 1-2 tablets of lorazepam prior to her MRI.  Prescription called into [facility]. POF placed per Dr. [name] for new appt on 02/05/15.  02/03/15 appt cancelled due to a conflict.

## 2015-01-19 NOTE — Telephone Encounter (Signed)
Pt called stating she got bad news yesterday and got upset. She forgot to ask Dr [name] for MS 30 mg BID. She has a family member that will come about 1400 to pick it up. S/w Zigmund Daniel and she said she will get it ready.

## 2015-01-19 NOTE — Telephone Encounter (Signed)
Pt is scheduled for US biopsy on 12/29, the MRI was not scheduled until 1/4 at 430. Dr [name] put in POF to see him on 1/4 at 29.  Conflict in schedule there. Pt also asking for a pill for nerves during MRI. Send Rx to [facility] please.

## 2015-01-20 ENCOUNTER — Telehealth: Payer: Self-pay | Admitting: Oncology

## 2015-01-20 ENCOUNTER — Ambulatory Visit (INDEPENDENT_AMBULATORY_CARE_PROVIDER_SITE_OTHER): Payer: Medicare Other | Admitting: Family Medicine

## 2015-01-20 ENCOUNTER — Encounter: Payer: Self-pay | Admitting: Family Medicine

## 2015-01-20 VITALS — BP 141/82 | HR 84 | Temp 98.2°F | Resp 20 | Wt 157.8 lb

## 2015-01-20 DIAGNOSIS — C50911 Malignant neoplasm of unspecified site of right female breast: Secondary | ICD-10-CM | POA: Diagnosis not present

## 2015-01-20 DIAGNOSIS — C7951 Secondary malignant neoplasm of bone: Secondary | ICD-10-CM

## 2015-01-20 NOTE — Patient Instructions (Signed)
I am sorry you are going through this. Pick up your pain medication and start immediately.

## 2015-01-20 NOTE — Progress Notes (Signed)
Subjective:    Patient ID: Brittney Cunningham, female    DOB: 1940/03/03, 74 y.o.   MRN: [id number]  HPI  Pain: Pts presents with her son for increase in neck, and left arm pain. 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 She has been under treatment, following closley with radiology. She  Had an appt with them Monday and was informed she now has areas of her liver that appear to be cancerous. Pt is prescribed MS contin and gabapentin through her oncologist. She states in her hurry to get out of the office and receiving bad news, she forgot to get her pain medicaitons. She now does have the script at the pharmacy being filled. She had some mild nausea yesterday. She reports her pian is in two areas of her left arm (points to bicep and tricep area. And across her shoulder/neck area. She denies any trauma or prior surgery to these areas. She States she has had achy feelings of this before and used Aspercreme and it helped. She states the pain is throbbing and constant. She has very little sleep after receiving the news of her CT scan. She has also been started on Remeron.   Past Medical History  Diagnosis Date  . 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 Anxiety and depression   . GERD (gastroesophageal reflux disease)   . Hyperlipidemia   . Hypothyroidism   . Osteoarthritis   . Carotid artery stenosis     bilateral  . CAD (coronary artery disease)     medical therapy, old tot RCA, patent LAD, mod. severe ostial LCX stenosis--PCI would be too complex-med mgmt recommended.  . OSA (obstructive sleep apnea)   . Hypertension   . Carotid artery occlusion   . Unspecified constipation 03/15/2012  . Bursitis   . Collagen vascular disease ([place])   . History of radiation therapy 10/31/12-11/13/12    rt&lt humerous 30Gy/73fx  . Valvular heart disease 07/11/2013  . Diarrhea 09/28/2013  . Chronic renal insufficiency, stage II (mild)     Borderline stage II/III,  CrCl 50s/60s.  . History of blood transfusion   . Urinary tract bacterial infections   . Breast cancer ([place]) 05/2009    Grade 2 invasive ductal carcinoma, T2 NXM1, stage IV at presentation. There was bone only involvement. New liver lesions 12/2014--Dr. Magrinat plans on abd MRI then bx of liver lesion to guide therapy.  . Diabetes mellitus type II     insulin  . Hypocalcemia 07/2014    required IV calcium in ED once and was admitted to hosp once.  ? secondary to [facility] and everolimus?  . Drug-induced pneumonitis 08/2014    (Everolimus) CXR 09/21/14 improved; needs another CXR 2-3 wks later to make sure resolution occurs.   Allergies  Allergen Reactions  . Chlorhexidine Hives, Itching and Rash    This was most likely a CONTACT DERMATITIS versus true systemic allergic reaction  . Adhesive [Tape] Hives  . Codeine Swelling  . Latex Rash   Social History   Social History  . Marital Status: Married    Spouse Name: N/A  . Number of Children: 3  . Years of Education: N/A   Occupational History  . 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 Smoking status: Former Smoker -- 1.00 packs/day for 20 years    Types: Cigarettes    Quit date:  01/31/1988  . Smokeless tobacco: Never Used  . Alcohol Use: No  . Drug Use: No  . Sexual Activity: Not Currently    Birth Control/ Protection: Post-menopausal   Other Topics Concern  . Not on file   Social History Narrative   Retired-ran a daycare facility for 40 years.   Married- 41 years    2 sons    1 daughter - Marine scientist midwife   Former smoker-quit in [place].     Review of Systems Negative, with the exception of above mentioned in HPI     Objective:   Physical Exam BP 141/82 mmHg  Pulse 84  Temp(Src) 98.2 F (36.8 C)  Resp 20  Wt 157 lb 12 oz (71.555 kg)  SpO2 100%  LMP 03/13/2012 Gen: Afebrile. No acute distress. Appears in pain.  HENT: AT. [place]. Tacky mucous membranes.  Eyes:Pupils Equal Round Reactive to  light, Extraocular movements intact,  Conjunctiva without redness, discharge or icterus. MSK: No erythema, no soft tissue swelling. Full ROM with discomfort of arm and neck.  Skin: No rashes, purpura or petechiae.  Neuro: transfers with discomfort. In seated roller walker. PERLA. EOMi. Alert. Oriented  x3   Assessment & Plan:  1. Breast cancer metastasized to bone, right ([place]) - pt has been without her chronic pain medications for 3 full days.  - pt prescribed gabapentin and MS contin 30 mg BID through oncology. She had forgotten to pick up her script when at the office after receiving news her cancer has spread to her liver. There is now a prescription at her pharmacy to be picked up. - I believe her pain is not a new pain, and is likely from being without medications/spread of cancer. Patient had mild nausea yesterday, possibly early withdraw. VSS today. Encouraged pt and son to pick up medication immediately and giver her a dose and resume every 12 hour schedule. Reassured pt re-start of medications should resolve her discomfort. If it does no, she should be seen in the ED. F/u as needed

## 2015-01-20 NOTE — Telephone Encounter (Signed)
Called and left a message with new appointments per pof °

## 2015-01-21 ENCOUNTER — Telehealth: Payer: Self-pay | Admitting: *Deleted

## 2015-01-21 NOTE — Telephone Encounter (Signed)
Patient called reporting "dizziness.  Was seen 01-18-2015.  B/P = 130/37, given instructions to reduce my B/P pills in half from 10 mg to 5 mg, 60 mg to 30 mg and two times a day to once a day to keep me from getting dizzy.  I think this was too much because I am very dizzy and feel like I could pass out.  I saw my PCP yesterday and B/P was higher = 141/82.  What should I do?  I do not have B/P cuff in the home."    This nurse advised she proceed to ED with symptoms.  Spouse can't drive, she will call daughter who lives 5 miles away.  Advised she call 911 and Call cardiologist to make them aware of these symptoms and changes in B/P.  This nurse will notify Dr. [name].  She denies being able to eat and drink well due to no appetite.  Asked this nurse if morphine she resumed from PCP is making her dizzy because she had not taken morphine in three days.  No further questions or complaints.

## 2015-01-21 NOTE — Telephone Encounter (Signed)
Called patient to F/u with c/o dizziness.  "I feel better now.  I drank some water and took a nap so I may have been dehydrated.  Thank you for caring but I'll wait until tomorrow and see how I feel.  I promise I'll go to ED if needed."

## 2015-01-27 ENCOUNTER — Other Ambulatory Visit: Payer: Self-pay | Admitting: Radiology

## 2015-01-28 ENCOUNTER — Ambulatory Visit (HOSPITAL_COMMUNITY)
Admission: RE | Admit: 2015-01-28 | Discharge: 2015-01-28 | Disposition: A | Discharge status: Home / Self Care | Payer: Medicare Other | Source: Ambulatory Visit | Attending: Oncology | Admitting: Oncology

## 2015-01-28 DIAGNOSIS — C7951 Secondary malignant neoplasm of bone: Principal | ICD-10-CM

## 2015-01-28 DIAGNOSIS — C50911 Malignant neoplasm of unspecified site of right female breast: Secondary | ICD-10-CM

## 2015-01-28 MED ORDER — SODIUM CHLORIDE 0.9 % IV SOLN: INTRAVENOUS | Status: DC

## 2015-01-28 NOTE — Progress Notes (Signed)
Per dr wagner, IR procedure cancelled. Pt home with daughter

## 2015-01-28 NOTE — Discharge Instructions (Signed)
Follow up with doctor

## 2015-01-29 ENCOUNTER — Telehealth: Payer: Self-pay | Admitting: *Deleted

## 2015-01-29 ENCOUNTER — Other Ambulatory Visit: Payer: Self-pay | Admitting: *Deleted

## 2015-01-29 MED ORDER — AMLODIPINE BESYLATE 10 MG PO TABS: 10.0000 mg | ORAL_TABLET | Freq: Every day | ORAL | Status: DC

## 2015-01-29 NOTE — Telephone Encounter (Signed)
Pt called to this RN to inquire about appointment with MD on 1/6.  The liver biopsy was scheduled yesterday had to be rescheduled to 1/6 due to pt not being informed when scheduled that she would need to hold her Plavix.  Biopsy scheduled for 11am on 1/6 and pt is scheduled to see the MD at noon same day.  This note will be reviewed with MD for appropriate rescheduling.  Pt aware and will await return call next week.

## 2015-01-29 NOTE — Telephone Encounter (Signed)
RF request for amlodipine LOV: 11/05/14 Next ov: None Last written: 06/15/14 #30 w/ 6RF

## 2015-02-03 ENCOUNTER — Ambulatory Visit (HOSPITAL_COMMUNITY)
Admission: RE | Admit: 2015-02-03 | Discharge: 2015-02-03 | Disposition: A | Discharge status: Home / Self Care | Payer: Medicare Other | Source: Ambulatory Visit | Attending: Oncology | Admitting: Oncology

## 2015-02-03 ENCOUNTER — Other Ambulatory Visit: Payer: Self-pay | Admitting: Oncology

## 2015-02-03 ENCOUNTER — Other Ambulatory Visit: Payer: Self-pay | Admitting: Radiology

## 2015-02-03 DIAGNOSIS — K769 Liver disease, unspecified: Secondary | ICD-10-CM | POA: Diagnosis not present

## 2015-02-03 DIAGNOSIS — R918 Other nonspecific abnormal finding of lung field: Secondary | ICD-10-CM | POA: Diagnosis not present

## 2015-02-03 DIAGNOSIS — C50811 Malignant neoplasm of overlapping sites of right female breast: Secondary | ICD-10-CM

## 2015-02-03 DIAGNOSIS — Z853 Personal history of malignant neoplasm of breast: Secondary | ICD-10-CM | POA: Diagnosis present

## 2015-02-03 DIAGNOSIS — I700 Atherosclerosis of aorta: Secondary | ICD-10-CM | POA: Diagnosis not present

## 2015-02-03 DIAGNOSIS — C7951 Secondary malignant neoplasm of bone: Secondary | ICD-10-CM

## 2015-02-03 DIAGNOSIS — K8020 Calculus of gallbladder without cholecystitis without obstruction: Secondary | ICD-10-CM | POA: Insufficient documentation

## 2015-02-03 DIAGNOSIS — C50911 Malignant neoplasm of unspecified site of right female breast: Secondary | ICD-10-CM

## 2015-02-03 MED ORDER — GADOBENATE DIMEGLUMINE 529 MG/ML IV SOLN
15.0000 mL | Freq: Once | INTRAVENOUS | Status: AC | PRN
  Administered 2015-02-03: 15 mL via INTRAVENOUS

## 2015-02-04 ENCOUNTER — Other Ambulatory Visit: Payer: Self-pay | Admitting: *Deleted

## 2015-02-04 DIAGNOSIS — C50911 Malignant neoplasm of unspecified site of right female breast: Secondary | ICD-10-CM

## 2015-02-04 DIAGNOSIS — C7951 Secondary malignant neoplasm of bone: Principal | ICD-10-CM

## 2015-02-05 ENCOUNTER — Ambulatory Visit (HOSPITAL_COMMUNITY)
Admission: RE | Admit: 2015-02-05 | Discharge: 2015-02-05 | Disposition: A | Discharge status: Home / Self Care | Payer: Medicare Other | Source: Ambulatory Visit | Attending: Oncology | Admitting: Oncology

## 2015-02-05 ENCOUNTER — Ambulatory Visit: Payer: Self-pay | Admitting: Oncology

## 2015-02-05 ENCOUNTER — Ambulatory Visit: Payer: Medicare Other | Admitting: Family Medicine

## 2015-02-05 DIAGNOSIS — I2510 Atherosclerotic heart disease of native coronary artery without angina pectoris: Secondary | ICD-10-CM | POA: Diagnosis not present

## 2015-02-05 DIAGNOSIS — K8020 Calculus of gallbladder without cholecystitis without obstruction: Secondary | ICD-10-CM | POA: Insufficient documentation

## 2015-02-05 DIAGNOSIS — Z853 Personal history of malignant neoplasm of breast: Secondary | ICD-10-CM | POA: Insufficient documentation

## 2015-02-05 DIAGNOSIS — C7951 Secondary malignant neoplasm of bone: Secondary | ICD-10-CM | POA: Insufficient documentation

## 2015-02-05 DIAGNOSIS — Z87891 Personal history of nicotine dependence: Secondary | ICD-10-CM | POA: Insufficient documentation

## 2015-02-05 DIAGNOSIS — K769 Liver disease, unspecified: Secondary | ICD-10-CM | POA: Insufficient documentation

## 2015-02-05 LAB — CBC WITH DIFFERENTIAL/PLATELET
BASOS PCT: 0 %
Basophils Absolute: 0 10*3/uL (ref 0.0–0.1)
EOS ABS: 0.1 10*3/uL (ref 0.0–0.7)
Eosinophils Relative: 2 %
HEMATOCRIT: 31.3 % — AB (ref 36.0–46.0)
HEMOGLOBIN: 9.4 g/dL — AB (ref 12.0–15.0)
LYMPHS ABS: 1.2 10*3/uL (ref 0.7–4.0)
Lymphocytes Relative: 24 %
MCH: 29 pg (ref 26.0–34.0)
MCHC: 30 g/dL (ref 30.0–36.0)
MCV: 96.6 fL (ref 78.0–100.0)
MONO ABS: 0.5 10*3/uL (ref 0.1–1.0)
MONOS PCT: 10 %
Neutro Abs: 3.2 10*3/uL (ref 1.7–7.7)
Neutrophils Relative %: 64 %
Platelets: 256 10*3/uL (ref 150–400)
RBC: 3.24 MIL/uL — ABNORMAL LOW (ref 3.87–5.11)
RDW: 15 % (ref 11.5–15.5)
WBC: 5.1 10*3/uL (ref 4.0–10.5)

## 2015-02-05 LAB — COMPREHENSIVE METABOLIC PANEL
ALBUMIN: 3.3 g/dL — AB (ref 3.5–5.0)
ALK PHOS: 124 U/L (ref 38–126)
ALT: 13 U/L — ABNORMAL LOW (ref 14–54)
ANION GAP: 18 — AB (ref 5–15)
AST: 28 U/L (ref 15–41)
BILIRUBIN TOTAL: 1.2 mg/dL (ref 0.3–1.2)
BUN: 43 mg/dL — ABNORMAL HIGH (ref 6–20)
CALCIUM: 8.9 mg/dL (ref 8.9–10.3)
CO2: 25 mmol/L (ref 22–32)
Chloride: 100 mmol/L — ABNORMAL LOW (ref 101–111)
Creatinine, Ser: 0.97 mg/dL (ref 0.44–1.00)
GFR calc non Af Amer: 57 mL/min — ABNORMAL LOW (ref >60)
Glucose, Bld: 130 mg/dL — ABNORMAL HIGH (ref 65–99)
POTASSIUM: 3.9 mmol/L (ref 3.5–5.1)
SODIUM: 143 mmol/L (ref 135–145)
TOTAL PROTEIN: 7.4 g/dL (ref 6.5–8.1)

## 2015-02-05 LAB — PROTIME-INR
INR: 1.12 (ref 0.00–1.49)
Prothrombin Time: 14.5 seconds (ref 11.6–15.2)

## 2015-02-05 LAB — GLUCOSE, CAPILLARY: GLUCOSE-CAPILLARY: 121 mg/dL — AB (ref 65–99)

## 2015-02-05 LAB — APTT: APTT: 36 s (ref 24–37)

## 2015-02-05 MED ORDER — SODIUM CHLORIDE 0.9 % IV SOLN
INTRAVENOUS | Status: DC
  Administered 2015-02-05: 09:00:00 via INTRAVENOUS

## 2015-02-05 MED ORDER — MIDAZOLAM HCL 2 MG/2ML IJ SOLN
INTRAMUSCULAR | Status: AC
  Filled 2015-02-05: qty 4

## 2015-02-05 MED ORDER — FENTANYL CITRATE (PF) 100 MCG/2ML IJ SOLN
INTRAMUSCULAR | Status: AC
  Filled 2015-02-05: qty 4

## 2015-02-05 MED ORDER — MIDAZOLAM HCL 2 MG/2ML IJ SOLN
INTRAMUSCULAR | Status: AC | PRN
  Administered 2015-02-05 (×2): 0.5 mg via INTRAVENOUS

## 2015-02-05 MED ORDER — HEPARIN SOD (PORK) LOCK FLUSH 100 UNIT/ML IV SOLN
500.0000 [IU] | Freq: Once | INTRAVENOUS | Status: AC
  Administered 2015-02-05: 500 [IU] via INTRAVENOUS
  Filled 2015-02-05 (×2): qty 5

## 2015-02-05 MED ORDER — FENTANYL CITRATE (PF) 100 MCG/2ML IJ SOLN
INTRAMUSCULAR | Status: AC | PRN
  Administered 2015-02-05 (×2): 25 ug via INTRAVENOUS

## 2015-02-05 NOTE — Sedation Documentation (Signed)
Patient is resting comfortably. 

## 2015-02-05 NOTE — Discharge Instructions (Signed)
Quiet rest remainder of day. No change in diet or medications. Leave bandaid on for 24 hours then may remove and bathe as usual. Call [facility] at [phone] for and problems or questions, unusual bleeding pain not relieved by medication.    Moderate Conscious Sedation, Adult, Care After Refer to this sheet in the next few weeks. These instructions provide you with information on caring for yourself after your procedure. Your health care provider may also give you more specific instructions. Your treatment has been planned according to current medical practices, but problems sometimes occur. Call your health care provider if you have any problems or questions after your procedure. WHAT TO EXPECT AFTER THE PROCEDURE  After your procedure:  You may feel sleepy, clumsy, and have poor balance for several hours.  Vomiting may occur if you eat too soon after the procedure. HOME CARE INSTRUCTIONS  Do not participate in any activities where you could become injured for at least 24 hours. Do not:  Drive.  Swim.  Ride a bicycle.  Operate heavy machinery.  Cook.  Use power tools.  Climb ladders.  Work from a high place.  Do not make important decisions or sign legal documents until you are improved.  If you vomit, drink water, juice, or soup when you can drink without vomiting. Make sure you have little or no nausea before eating solid foods.  Only take over-the-counter or prescription medicines for pain, discomfort, or fever as directed by your health care provider.  Make sure you and your family fully understand everything about the medicines given to you, including what side effects may occur.  You should not drink alcohol, take sleeping pills, or take medicines that cause drowsiness for at least 24 hours.  If you smoke, do not smoke without supervision.  If you are feeling better, you may resume normal activities 24 hours after you were sedated.  Keep all  appointments with your health care provider. SEEK MEDICAL CARE IF:  Your skin is pale or bluish in color.  You continue to feel nauseous or vomit.  Your pain is getting worse and is not helped by medicine.  You have bleeding or swelling.  You are still sleepy or feeling clumsy after 24 hours. SEEK IMMEDIATE MEDICAL CARE IF:  You develop a rash.  You have difficulty breathing.  You develop any type of allergic problem.  You have a fever. MAKE SURE YOU:  Understand these instructions.  Will watch your condition.  Will get help right away if you are not doing well or get worse.   This information is not intended to replace advice given to you by your health care provider. Make sure you discuss any questions you have with your health care provider.   Document Released: 11/06/2012 Document Revised: 02/06/2014 Document Reviewed: 11/06/2012 Elsevier Interactive Patient Education 2016 [place]. Liver Biopsy, Care After Refer to this sheet in the next few weeks. These instructions provide you with information on caring for yourself after your procedure. Your health care provider may also give you more specific instructions. Your treatment has been planned according to current medical practices, but problems sometimes occur. Call your health care provider if you have any problems or questions after your procedure. WHAT TO EXPECT AFTER THE PROCEDURE After your procedure, it is typical to have the following:  A small amount of discomfort in the area where the biopsy was done and in the right shoulder or shoulder blade.  A small amount of bruising around  the area where the biopsy was done and on the skin over the liver.  Sleepiness and fatigue for the rest of the day. HOME CARE INSTRUCTIONS   Rest at home for 1-2 days or as directed by your health care provider.  Have a friend or family member stay with you for at least 24 hours.  Because of the medicines used during the  procedure, you should not do the following things in the first 24 hours:  Drive.  Use machinery.  Be responsible for the care of other people.  Sign legal documents.  Take a bath or shower.  There are many different ways to close and cover an incision, including stitches, skin glue, and adhesive strips. Follow your health care provider's instructions on:  Incision care.  Bandage (dressing) changes and removal.  Incision closure removal.  Do not drink alcohol in the first week.  Do not lift more than 5 pounds or play contact sports for 2 weeks after this test.  Take medicines only as directed by your health care provider. Do not take medicine containing aspirin or non-steroidal anti-inflammatory medicines such as ibuprofen for 1 week after this test.  It is your responsibility to get your test results. SEEK MEDICAL CARE IF:   You have increased bleeding from an incision that results in more than a small spot of blood.  You have redness, swelling, or increasing pain in any incisions.  You notice a discharge or a bad smell coming from any of your incisions.  You have a fever or chills. SEEK IMMEDIATE MEDICAL CARE IF:   You develop swelling, bloating, or pain in your abdomen.  You become dizzy or faint.  You develop a rash.  You are nauseous or vomit.  You have difficulty breathing, feel short of breath, or feel faint.  You develop chest pain.  You have problems with your speech or vision.  You have trouble balancing or moving your arms or legs.   This information is not intended to replace advice given to you by your health care provider. Make sure you discuss any questions you have with your health care provider.   Document Released: 08/05/2004 Document Revised: 02/06/2014 Document Reviewed: 03/14/2013 Elsevier Interactive Patient Education [employer].

## 2015-02-05 NOTE — H&P (Signed)
Chief Complaint: Patient was seen in consultation today for  US guided liver lesion biopsy  Referring Physician(s): Magrinat,Gustav C  History of Present Illness: Brittney Cunningham is a 75 y.o. 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 She presents today for US guided liver lesion biopsy.   Past Medical History  Diagnosis Date  . 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 Anxiety and depression   . GERD (gastroesophageal reflux disease)   . Hyperlipidemia   . Hypothyroidism   . Osteoarthritis   . Carotid artery stenosis     bilateral  . CAD (coronary artery disease)     medical therapy, old tot RCA, patent LAD, mod. severe ostial LCX stenosis--PCI would be too complex-med mgmt recommended.  . OSA (obstructive sleep apnea)   . Hypertension   . Carotid artery occlusion   . Unspecified constipation 03/15/2012  . Bursitis   . Collagen vascular disease ([place])   . History of radiation therapy 10/31/12-11/13/12    rt&lt humerous 30Gy/85f  . Valvular heart disease 07/11/2013  . Diarrhea 09/28/2013  . Chronic renal insufficiency, stage II (mild)     Borderline stage II/III, CrCl 50s/60s.  . History of blood transfusion   . Urinary tract bacterial infections   . [facility] [place] [place]    Grade 2 invasive ductal carcinoma, T2 NXM1, stage IV at presentation. There was bone only involvement. New liver lesions 12/2014--Dr. Magrinat plans on abd MRI then bx of liver lesion to guide therapy.  . Diabetes mellitus type II     insulin  . Hypocalcemia 07/2014    required IV calcium in ED once and was admitted to hosp once.  ? [facility]?  . Drug-induced pneumonitis 08/2014    (Everolimus) CXR 09/21/14 improved; needs another CXR 2-3 wks later to make sure resolution occurs.    Past Surgical History  Procedure Laterality Date  . Incisional breast biopsy     remoted left breast biopsy  . Cesarean section    . Other surgical history      GYN surgery  . Knee surgery      right knee surgery  . Carotid endarterectomy  2011    right carotid  . Port-a-cath removal    . Modified radical mastectomy  Feb 2012    Right breast  . Carotid stent      left  . Cardiac catheterization  06/2013    old total occ. of RCA, moderately severe ostial LCX stenosis- medical therapy- would be complex PCI  . Carotid angiogram N/A 03/01/2011    Procedure: CAROTID ANGIOGRAM;  Surgeon: BConrad Esbon MD;  [facility];  Service: Cardiovascular;  Laterality: N/A;  . Arch aortogram  03/01/2011    Procedure: ARCH AORTOGRAM;  Surgeon: BConrad Powhatan MD;  [facility];  Service: Cardiovascular;;  . Carotid stent insertion Left 03/15/2011    Procedure: CAROTID STENT INSERTION;  Surgeon: VSerafina Mitchell MD;  [facility];  Service: Cardiovascular;  Laterality: Left;  . Left heart catheterization with coronary angiogram N/A 07/07/2013    Procedure: LEFT HEART CATHETERIZATION WITH CORONARY ANGIOGRAM;  Surgeon: MBlane Ohara MD;  [facility];  Service: Cardiovascular;  Laterality: N/A;  . Carotid dopplers  05/2014    Patent ICAs, bilat ECA dz: no change compared to 2014  .  Transthoracic echocardiogram  06/2013    EF 55-60%, grd I diast dysfxn, mod mitral regurg    Allergies: Chlorhexidine; Adhesive; and Codeine  Medications: Prior to Admission medications   Medication Sig Start Date End Date Taking? Authorizing Provider  amLODipine (NORVASC) 10 MG tablet Take 1 tablet (10 mg total) by mouth daily. 01/29/15  Yes [name], MD  aspirin 81 MG chewable tablet Chew 81 mg by mouth daily.   Yes Historical Provider, MD  atorvastatin (LIPITOR) 80 MG tablet Take 1 tablet (80 mg total) by mouth daily. 07/20/14  Yes Peter M Martinique, MD  calcium carbonate (TUMS) 500 MG chewable tablet Chew 1 tablet (200 mg of elemental calcium total) by mouth 3  (three) times daily with meals. 08/02/14  Yes [name], MD  carvedilol (COREG) 25 MG tablet Take 1 tablet (25 mg total) by mouth 2 (two) times daily with a meal. 11/23/14  Yes Peter M Martinique, MD  cyanocobalamin (,VITAMIN B-12,) 1000 MCG/ML injection Inject 1,000 mcg into the muscle every 30 (thirty) days.    Yes Historical Provider, MD  darbepoetin (ARANESP) 200 MCG/0.4ML SOLN Inject 300 mcg into the skin every 14 (fourteen) days.   Yes Historical Provider, MD  exemestane (AROMASIN) 25 MG tablet Take 1 tablet (25 mg total) by mouth daily after breakfast. 10/20/14  Yes Chauncey Cruel, MD  furosemide (LASIX) 40 MG tablet Take 1 tablet (40 mg total) by mouth 2 (two) times daily. 1 tab po bid 10/20/14  Yes Chauncey Cruel, MD  gabapentin (NEURONTIN) 100 MG capsule Take 1 capsule (100 mg total) by mouth 3 (three) times daily. 12/31/14  Yes Chauncey Cruel, MD  insulin detemir (LEVEMIR) 100 UNIT/ML injection Inject 0.15 mLs (15 Units total) into the skin at bedtime. 10/14/14  Yes [name], MD  insulin lispro (HUMALOG KWIKPEN) 100 UNIT/ML KiwkPen 2 U SQ qAM, 3 U SQ qLUNCH, and 3 U SQ qSupper 10/14/14  Yes [name], MD  isosorbide mononitrate (IMDUR) 60 MG 24 hr tablet Take 1 tablet (60 mg total) by mouth 2 (two) times daily. 08/13/14  Yes Peter M Martinique, MD  levothyroxine (SYNTHROID, LEVOTHROID) 150 MCG tablet 1 tab po qd except take 1 and 1/2 tabs on Mondays and Fridays 01/18/15  Yes [name], MD  mirtazapine (REMERON) 15 MG tablet Take 1 tablet (15 mg total) by mouth at bedtime. 01/18/15  Yes Chauncey Cruel, MD  morphine (MS CONTIN) 30 MG 12 hr tablet Take 1 tablet (30 mg total) by mouth 2 (two) times daily. 01/19/15  Yes Chauncey Cruel, MD  potassium chloride SA (K-DUR,KLOR-CON) 20 MEQ tablet Take 2 tablets (40 mEq total) by mouth daily. 10/23/14  Yes Chauncey Cruel, MD  senna-docusate (SENOKOT-S) 8.6-50 MG per tablet Take 1 tablet by mouth at bedtime.    Yes Historical  Provider, MD  ACCU-CHEK AVIVA PLUS test strip CHECK BLOOD SUGAR TWICE A DAY FOR DX:250.00 08/10/14   [name], MD  Blood Glucose Monitoring Suppl (ACCU-CHEK NANO SMARTVIEW) W/DEVICE KIT Use to check blood sugar twice a day. DX 250.00 03/28/13   Mosie Lukes, MD  clopidogrel (PLAVIX) 75 MG tablet Take 1 tablet (75 mg total) by mouth daily. 10/23/14   Serafina Mitchell, MD  glycerin adult (GLYCERIN ADULT) 2 G SUPP Place 1 suppository rectally once as needed (constipation). 04/13/12   Julianne Rice, MD  LORazepam (ATIVAN) 0.5 MG tablet Take one as needed for anxiety and a second  tablet prn. 01/19/15   Chauncey Cruel, MD  nitroGLYCERIN (NITROSTAT) 0.4 MG SL tablet Place 1 tablet (0.4 mg total) under the tongue every 5 (five) minutes as needed for chest pain. 07/08/13   Janece Canterbury, MD  omeprazole (PRILOSEC) 40 MG capsule Take 1 capsule (40 mg total) by mouth daily. 03/02/14   Mosie Lukes, MD  ondansetron (ZOFRAN) 4 MG tablet Take 1 tablet (4 mg total) by mouth every 8 (eight) hours as needed for nausea or vomiting. 09/02/14   Janece Canterbury, MD  polyethylene glycol [facility] / GLYCOLAX) packet Take 17 g by mouth daily as needed for mild constipation.     Historical Provider, MD  prochlorperazine (COMPAZINE) 10 MG tablet Take 1 tablet (10 mg total) by mouth every 6 (six) hours as needed for nausea or vomiting. 08/07/14   Susanne Borders, NP     Family History  Problem Relation Age of Onset  . Arthritis    . Coronary artery disease      first degree relative  . Stroke      first degree relative  . Diabetes Mother   . Heart disease Mother   . Hypertension Mother   . Cancer Mother   . Deep vein thrombosis Mother   . Colon cancer Neg Hx   . Stomach cancer Neg Hx   . Cancer Father     Pancreatic cancer  . Breast cancer Paternal Aunt 73    Social History   Social History  . Marital Status: Married    Spouse Name: N/A  . Number of Children: 3  . Years of Education: N/A    Occupational History  . 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 Smoking status: Former Smoker -- 1.00 packs/day for 20 years    Types: Cigarettes    Quit date: 01/31/1988  . Smokeless tobacco: Never Used  . Alcohol Use: No  . Drug Use: No  . Sexual Activity: Not Currently    Birth Control/ Protection: Post-menopausal   Other Topics Concern  . Not on file   Social History Narrative   Retired-ran a daycare facility for 40 years.   Married- 56 years    2 sons    1 daughter - Marine scientist midwife   Former smoker-quit in [place].       Review of Systems  Constitutional: Positive for fatigue and unexpected weight change. Negative for fever and chills.  Respiratory: Negative for cough and shortness of breath.   Cardiovascular: Negative for chest pain.  Gastrointestinal: Positive for abdominal pain. Negative for nausea, vomiting and blood in stool.  Genitourinary: Negative for dysuria and hematuria.  Musculoskeletal: Negative for back pain.  Neurological: Negative for headaches.    Vital Signs: BP 145/69 mmHg  Pulse 67  Temp(Src) 97.6 F (36.4 C) (Oral)  Resp 20  SpO2 99%  LMP 03/13/2012  Physical Exam  Constitutional: She is oriented to person, place, and time. She appears well-developed and well-nourished.  Cardiovascular: Normal rate and regular rhythm.   Pulmonary/Chest: Effort normal and breath sounds normal.  Abdominal: Soft. Bowel sounds are normal. There is tenderness.  Musculoskeletal: Normal range of motion. She exhibits no edema.  Neurological: She is alert and oriented to person, place, and time.    Mallampati Score:     Imaging: Ct Chest W Contrast  01/12/2015  CLINICAL DATA:  75 year old female with history of right-sided breast cancer diagnosed in 2011 status post right  mastectomy. Bone metastasis. EXAM: CT CHEST WITH CONTRAST TECHNIQUE: Multidetector CT imaging of the chest was performed during intravenous contrast  administration. CONTRAST:  29m OMNIPAQUE IOHEXOL 300 MG/ML  SOLN COMPARISON:  Chest CT 08/25/2014. FINDINGS: Mediastinum/Lymph Nodes: Heart size is mildly enlarged. There is no significant pericardial fluid, thickening or pericardial calcification. There is atherosclerosis of the thoracic aorta, the great vessels of the mediastinum and the coronary arteries, including calcified atherosclerotic plaque in the left main, left anterior descending, left circumflex and right coronary arteries. Calcifications of the aortic valve. Right-sided internal jugular single-lumen porta cath with tip terminating in the right atrium. No pathologically enlarged mediastinal, internal mammary or hilar lymph nodes. Esophagus is unremarkable in appearance. No axillary lymphadenopathy. Lungs/Pleura: 6 mm subpleural nodule in the anterior aspect of the right upper lobe (image 26 of series 5), similar to prior study 07/15/2014. Other small nodules are also noted associated with both major fissures, favored to represent tiny subpleural lymph nodes measuring 2-4 mm in size bilaterally. 5 mm left lower lobe nodule (image 38 of series 5), slightly larger than prior study from 07/15/2014, at which point this nodule measured only 2 mm in retrospect. Plaque-like area of pleuroparenchymal thickening in the periphery of the right lower lobe measuring 2.2 x 0.6 cm (image 44 of series 5) slightly more apparent than prior examinations. The majority of the previously noted multifocal airspace disease has completely resolved compared to the prior examination, although there is a small amount of residual consolidation or atelectasis in the inferior segment of the lingula. Upper Abdomen: Innumerable poorly defined intermediate attenuation areas in the liver are now noted and new compared to prior examinations, highly concerning for widespread metastatic disease, with the largest of these lesions measuring up to 2.3 x 3.4 cm in the periphery of segment 4A  (image 58 of series 2). Atherosclerosis in the visualized abdominal vasculature. Musculoskeletal/Soft Tissues: Again noted are diffuse predominantly sclerotic osseous lesions throughout the visualized axial and appendicular skeleton, compatible with widespread metastatic disease to the bone. Multiple healed or healing pathologic right-sided rib fractures are again noted. Status post right modified radical mastectomy. IMPRESSION: 1. Today's study demonstrates progression of disease, as evidenced by interval development of numerous new hepatic lesions which are highly concerning for metastatic disease to the liver. 2. Slight interval enlargement of a left lower lobe pulmonary nodule which measures only 5 mm, and enlargement of a plaque-like area of pleuroparenchymal thickening in the periphery of the right lower lobe measuring 2.2 x 0.6 cm (image 44 of series 5). The possibility of metastatic disease to the lungs is not excluded, and attention on followup studies is recommended. 3. Widespread metastatic disease to the bones appear similar to prior examinations. 4. Atherosclerosis, including left main and 3 vessel coronary artery disease. 5. Additional incidental findings, as above. Electronically Signed   By: DVinnie LangtonM.D.   On: 01/12/2015 10:55   Mr Abdomen W Wo Contrast  02/04/2015  CLINICAL DATA:  [age]year old female with history of breast cancer with bone metastasis. New liver lesions. EXAM: MRI ABDOMEN WITHOUT AND WITH CONTRAST TECHNIQUE: Multiplanar multisequence MR imaging of the abdomen was performed both before and after the administration of intravenous contrast. CONTRAST:  128mMULTIHANCE GADOBENATE DIMEGLUMINE 529 MG/ML IV SOLN COMPARISON:  None. FINDINGS: Lower chest: Again noted is a plaque-like area of pleural thickening in the lateral aspect of the lower right hemithorax measuring approximately 2.2 x 0.8 cm (image 15 of series 1201). Hepatobiliary: There are numerous (greater than 20)  hepatic  lesions on today's study which are T1 hypointense, mildly T2 hyperintense, restrict diffusion, and demonstrates progressive central enhancement on post gadolinium images, compatible with multiple new metastatic lesions. These are best demonstrated on precontrast T1 weighted images, with the largest of these lesions measures up to 1.3 x 1.7 cm (image 57 of series 1200) in segment 3 of the liver anteriorly. No intra or extrahepatic biliary ductal dilatation. Numerous tiny filling defects in the gallbladder are compatible with small gallstones. No current findings to suggest an acute cholecystitis at this time. Pancreas: Diffuse fatty atrophy in the pancreas. No pancreatic mass. No pancreatic ductal dilatation. No pancreatic or peripancreatic fluid or inflammatory changes. Spleen: Unremarkable. Adrenals/Urinary Tract: Several sub cm lesions are noted in the kidneys bilaterally, T1 hypointense, T2 hyperintense and nonenhancing, compatible with tiny cysts. No hydroureteronephrosis in the visualized abdomen. Bilateral adrenal glands are normal in appearance. Stomach/Bowel: Visualized portions are unremarkable. Vascular/Lymphatic: No aneurysm identified in the visualized abdominal vasculature. Extensive atherosclerotic disease throughout the abdominal aorta. No lymphadenopathy noted in the abdomen. Other: No significant volume of ascites noted in the visualized peritoneal cavity. Musculoskeletal: No definite osseous lesions identified in the visualized portions of the skeleton on today's examination. IMPRESSION: 1. Numerous new hepatic lesions throughout all aspects of the liver measuring up to 1.3 x 1.7 cm in segment 3 of the liver, compatible with widespread hepatic metastases. 2. Plaque-like area of enhancing pleural thickening in the inferior aspect of the right hemithorax again noted. This is nonspecific, but pleural metastasis is not excluded, and continued attention on followup studies is recommended. 3.  Cholelithiasis without evidence of acute cholecystitis at this time. 4. Additional incidental findings, as above. Electronically Signed   By: Vinnie Langton M.D.   On: 02/04/2015 08:18    Labs:  CBC:  Recent Labs  12/14/14 1015 12/21/14 0813 01/11/15 0942 02/05/15 0846  WBC 5.4 5.3 5.6 5.1  HGB 9.6* 10.0* 9.6* 9.4*  HCT 31.0* 31.0* 30.3* 31.3*  PLT 196 220.0 211 256    COAGS:  Recent Labs  08/24/14 1410 08/26/14 0550 02/05/15 0846  INR 1.26 1.24 1.12  APTT 43*  --  36    BMP:  Recent Labs  09/01/14 0352 09/02/14 0350 09/03/14 0350  12/14/14 1015 12/21/14 0810 01/11/15 0942 02/05/15 0846  NA 133* 135 134*  < > 140 139 137 143  K 3.7 3.4* 3.5  < > 4.3 4.1 4.7 3.9  CL 88* 87* 83*  --   --  96*  --  100*  CO2 32 36* 37*  < > _0 GLUCOSE 258* 218* 137*  < > 149* 158* 191* 130*  BUN 36* 43* 46*  < > 67.0* 53* 54.2* 43*  CALCIUM 7.1* 7.3* 7.6*  < > 9.1 8.5* 9.6 8.9  CREATININE 1.49* 1.71* 1.58*  < > 1.8* 1.37* 1.5* 0.97  GFRNONAA 34* 29* 32*  --   --   --   --  57*  GFRAA 40* 34* 37*  --   --   --   --  >60  < > = values in this interval not displayed.  LIVER FUNCTION TESTS:  Recent Labs  11/30/14 1358 12/14/14 1015 01/11/15 0942 02/05/15 0846  BILITOT 0.37 0.51 0.48 1.2  AST _1 ALT _2 13*  ALKPHOS 115 98 120 124  PROT 7.4 7.2 7.4 7.4  ALBUMIN 3.3* 3.3* 3.2* 3.3*    TUMOR MARKERS: No results for  input(s): AFPTM, CEA, CA199, CHROMGRNA in the last 8760 hours.  Assessment and Plan: 75 y.o. 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 She presents today for US guided liver lesion biopsy.Risks and benefits discussed with the patient /daughter including, but not limited to bleeding, infection, damage to adjacent structures or low yield requiring additional tests.All of the patient's questions were answered, patient is agreeable to proceed.Consent signed and in  chart.     Thank you for this interesting consult.  I greatly enjoyed meeting Brittney Cunningham and look forward to participating in their care.  A copy of this report was sent to the requesting provider on this date.  Signed: D. Rowe Robert 02/05/2015, 10:21 AM   I spent a total of 15 minutes in face to face in clinical consultation, greater than 50% of which was counseling/coordinating care for US guided liver lesion biopsy

## 2015-02-05 NOTE — Sedation Documentation (Signed)
Vital signs stable. 

## 2015-02-06 ENCOUNTER — Other Ambulatory Visit: Payer: Self-pay | Admitting: Oncology

## 2015-02-07 ENCOUNTER — Telehealth: Payer: Self-pay | Admitting: Oncology

## 2015-02-07 NOTE — Telephone Encounter (Signed)
Spoke with patient and she is aware of her new appointment on 1/13.  She also states that she will be unable to come in on 1/9 for lab and inj due to the weather.  I have added those appointments to her 1/13 visit

## 2015-02-08 ENCOUNTER — Other Ambulatory Visit: Payer: Self-pay

## 2015-02-08 ENCOUNTER — Ambulatory Visit: Payer: Self-pay

## 2015-02-09 ENCOUNTER — Ambulatory Visit (HOSPITAL_COMMUNITY): Payer: Self-pay

## 2015-02-09 ENCOUNTER — Ambulatory Visit (HOSPITAL_COMMUNITY): Payer: Medicare Other

## 2015-02-12 ENCOUNTER — Telehealth: Payer: Self-pay | Admitting: Oncology

## 2015-02-12 ENCOUNTER — Ambulatory Visit (HOSPITAL_BASED_OUTPATIENT_CLINIC_OR_DEPARTMENT_OTHER): Payer: Medicare Other

## 2015-02-12 ENCOUNTER — Other Ambulatory Visit (HOSPITAL_BASED_OUTPATIENT_CLINIC_OR_DEPARTMENT_OTHER): Payer: Medicare Other

## 2015-02-12 ENCOUNTER — Ambulatory Visit (HOSPITAL_BASED_OUTPATIENT_CLINIC_OR_DEPARTMENT_OTHER): Payer: Medicare Other | Admitting: Oncology

## 2015-02-12 VITALS — BP 132/40 | HR 77

## 2015-02-12 VITALS — BP 128/36 | HR 70 | Temp 98.5°F | Resp 17 | Ht 62.0 in | Wt 157.7 lb

## 2015-02-12 DIAGNOSIS — M8708 Idiopathic aseptic necrosis of bone, other site: Secondary | ICD-10-CM

## 2015-02-12 DIAGNOSIS — C7951 Secondary malignant neoplasm of bone: Secondary | ICD-10-CM

## 2015-02-12 DIAGNOSIS — C50911 Malignant neoplasm of unspecified site of right female breast: Secondary | ICD-10-CM

## 2015-02-12 DIAGNOSIS — D631 Anemia in chronic kidney disease: Secondary | ICD-10-CM

## 2015-02-12 DIAGNOSIS — N184 Chronic kidney disease, stage 4 (severe): Principal | ICD-10-CM

## 2015-02-12 DIAGNOSIS — M810 Age-related osteoporosis without current pathological fracture: Secondary | ICD-10-CM

## 2015-02-12 DIAGNOSIS — C787 Secondary malignant neoplasm of liver and intrahepatic bile duct: Secondary | ICD-10-CM | POA: Diagnosis not present

## 2015-02-12 DIAGNOSIS — Z170 Estrogen receptor positive status [ER+]: Secondary | ICD-10-CM

## 2015-02-12 DIAGNOSIS — F411 Generalized anxiety disorder: Secondary | ICD-10-CM

## 2015-02-12 DIAGNOSIS — N189 Chronic kidney disease, unspecified: Secondary | ICD-10-CM

## 2015-02-12 DIAGNOSIS — C50919 Malignant neoplasm of unspecified site of unspecified female breast: Secondary | ICD-10-CM

## 2015-02-12 DIAGNOSIS — C50811 Malignant neoplasm of overlapping sites of right female breast: Secondary | ICD-10-CM

## 2015-02-12 DIAGNOSIS — G893 Neoplasm related pain (acute) (chronic): Secondary | ICD-10-CM

## 2015-02-12 DIAGNOSIS — C50912 Malignant neoplasm of unspecified site of left female breast: Secondary | ICD-10-CM

## 2015-02-12 DIAGNOSIS — Z452 Encounter for adjustment and management of vascular access device: Secondary | ICD-10-CM | POA: Diagnosis not present

## 2015-02-12 DIAGNOSIS — D649 Anemia, unspecified: Secondary | ICD-10-CM

## 2015-02-12 DIAGNOSIS — M879 Osteonecrosis, unspecified: Secondary | ICD-10-CM

## 2015-02-12 DIAGNOSIS — K219 Gastro-esophageal reflux disease without esophagitis: Secondary | ICD-10-CM

## 2015-02-12 DIAGNOSIS — N183 Chronic kidney disease, stage 3 (moderate): Principal | ICD-10-CM

## 2015-02-12 LAB — CBC WITH DIFFERENTIAL/PLATELET
BASO%: 0.9 % (ref 0.0–2.0)
Basophils Absolute: 0 10*3/uL (ref 0.0–0.1)
EOS%: 3.5 % (ref 0.0–7.0)
Eosinophils Absolute: 0.1 10*3/uL (ref 0.0–0.5)
HCT: 30.2 % — ABNORMAL LOW (ref 34.8–46.6)
HGB: 9.5 g/dL — ABNORMAL LOW (ref 11.6–15.9)
LYMPH%: 25 % (ref 14.0–49.7)
MCH: 28.8 pg (ref 25.1–34.0)
MCHC: 31.5 g/dL (ref 31.5–36.0)
MCV: 91.5 fL (ref 79.5–101.0)
MONO#: 0.4 10*3/uL (ref 0.1–0.9)
MONO%: 9 % (ref 0.0–14.0)
NEUT#: 2.6 10*3/uL (ref 1.5–6.5)
NEUT%: 61.6 % (ref 38.4–76.8)
Platelets: 206 10*3/uL (ref 145–400)
RBC: 3.31 10*6/uL — ABNORMAL LOW (ref 3.70–5.45)
RDW: 15.1 % — ABNORMAL HIGH (ref 11.2–14.5)
WBC: 4.2 10*3/uL (ref 3.9–10.3)
lymph#: 1 10*3/uL (ref 0.9–3.3)

## 2015-02-12 LAB — COMPREHENSIVE METABOLIC PANEL WITH GFR
ALT: 12 U/L (ref 0–55)
AST: 24 U/L (ref 5–34)
Albumin: 3.2 g/dL — ABNORMAL LOW (ref 3.5–5.0)
Alkaline Phosphatase: 159 U/L — ABNORMAL HIGH (ref 40–150)
Anion Gap: 16 meq/L — ABNORMAL HIGH (ref 3–11)
BUN: 55.4 mg/dL — ABNORMAL HIGH (ref 7.0–26.0)
CO2: 24 meq/L (ref 22–29)
Calcium: 9.5 mg/dL (ref 8.4–10.4)
Chloride: 98 meq/L (ref 98–109)
Creatinine: 1.4 mg/dL — ABNORMAL HIGH (ref 0.6–1.1)
EGFR: 36 mL/min/{1.73_m2} — ABNORMAL LOW
Glucose: 180 mg/dL — ABNORMAL HIGH (ref 70–140)
Potassium: 4.6 meq/L (ref 3.5–5.1)
Sodium: 138 meq/L (ref 136–145)
Total Bilirubin: 0.6 mg/dL (ref 0.20–1.20)
Total Protein: 7.5 g/dL (ref 6.4–8.3)

## 2015-02-12 MED ORDER — MORPHINE SULFATE 15 MG PO TABS: 15.0000 mg | ORAL_TABLET | ORAL | Status: DC | PRN

## 2015-02-12 MED ORDER — ONDANSETRON HCL 8 MG PO TABS: 8.0000 mg | ORAL_TABLET | Freq: Two times a day (BID) | ORAL | Status: DC

## 2015-02-12 MED ORDER — CYANOCOBALAMIN 1000 MCG/ML IJ SOLN
1000.0000 ug | Freq: Once | INTRAMUSCULAR | Status: AC
  Administered 2015-02-12: 1000 ug via INTRAMUSCULAR

## 2015-02-12 MED ORDER — MORPHINE SULFATE ER 30 MG PO TBCR: 30.0000 mg | EXTENDED_RELEASE_TABLET | Freq: Three times a day (TID) | ORAL | Status: DC

## 2015-02-12 MED ORDER — DARBEPOETIN ALFA 300 MCG/0.6ML IJ SOSY
300.0000 ug | PREFILLED_SYRINGE | Freq: Once | INTRAMUSCULAR | Status: AC
  Administered 2015-02-12: 300 ug via SUBCUTANEOUS
  Filled 2015-02-12: qty 0.6

## 2015-02-12 MED ORDER — SODIUM CHLORIDE 0.9 % IJ SOLN
10.0000 mL | INTRAMUSCULAR | Status: DC | PRN
  Administered 2015-02-12: 10 mL via INTRAVENOUS
  Filled 2015-02-12: qty 10

## 2015-02-12 MED ORDER — LIDOCAINE-PRILOCAINE 2.5-2.5 % EX CREA: TOPICAL_CREAM | CUTANEOUS | Status: DC

## 2015-02-12 MED ORDER — SODIUM CHLORIDE 0.9 % IV SOLN
1000.0000 mL | INTRAVENOUS | Status: DC
  Administered 2015-02-12: 15:00:00 via INTRAVENOUS

## 2015-02-12 MED ORDER — HEPARIN SOD (PORK) LOCK FLUSH 100 UNIT/ML IV SOLN
500.0000 [IU] | Freq: Once | INTRAVENOUS | Status: AC
  Administered 2015-02-12: 500 [IU] via INTRAVENOUS
  Filled 2015-02-12: qty 5

## 2015-02-12 MED ORDER — PROCHLORPERAZINE MALEATE 10 MG PO TABS: 10.0000 mg | ORAL_TABLET | Freq: Four times a day (QID) | ORAL | Status: DC | PRN

## 2015-02-12 NOTE — Progress Notes (Signed)
Patient ID: Brittney Cunningham, female   DOB: 05/30/1940, 75 y.o.   MRN: [id number]  [place]  [phone] [phone]  OFFICE PROGRESS NOTE   ID: Brittney Cunningham   DOB: 11-20-1940  MR#: [id number]  CBS#:[id number]   PCP: [name], MD SU: Rolm Bookbinder OTHER MD: Teena Dunk, Kyung Rudd, Justice Britain, Peter Martinique  CHIEF COMPLAINT:  Metastatic Breast Cancer CURRENT TREATMENT: aranesp bimonthly, B-12 monthly, denosumab every 12 weeks, eribulin  HISTORY OF PRESENT ILLNESS:  from the earlier summary:  Brittney Cunningham was diagnosed with right breast carcinoma in May of 2011, a biopsy showing a grade 2 invasive ductal carcinoma, T2 NXM1, stage IV at presentation. There was bone only involvement. Tumor was strongly ER and PR positive, HER-2/neu negative, with an MIP-1 of 26%.  She was treated neoadjuvantly with letrozole and zoledronic acid beginning in June of 2011. She is status post right modified radical mastectomy in February 2012 for a ypT2 ypN2, grade 1 invasive ductal carcinoma. Margins were negative.   Her subsequent history is as detailed below   INTERVAL HISTORY: Brittney Cunningham returns today for follow up of her metastatic breast cancer, now with liver involvement. She is accompanied by her son Larene Beach and her granddaughter Priscille Loveless. Since her last visit here Brittney Cunningham had a liver biopsy obtained 02/05/2015, and this confirmed malignancy which was estrogen and progesterone and gross cystic disease fluid protein positive. More specifically the estrogen receptor was 90% positive with strong staining intensity, the progesterone receptor 80% positive with moderate staining intensity. HER-2 was not amplified with a signals ratio 1.23, the number per cell 2.70.-- She is here today to discuss how to change her treatment with the liver lesions in mind  REVIEW OF SYSTEMS: Brittney Cunningham is "not doing so good". We cut her blood pressure medicines and half at the last visit but her blood  pressure remains marginal. She has had some nausea but no vomiting. She has slight headaches which are easily controlled with occasional Tylenol. Vision is fine. Her appetite continues to be decreased Anderson's of taste is somewhat altered. What is really bothering however is pain in her neck. There is no radiation. She is on 30 mg of MS Contin twice daily but this is not holding it. The narcotics do not constipate her. She has no cough, phlegm production, pleurisy, or worsening shortness of breath. She has mild peripheral neuropathy involving only her toes. A detailed review of systems today was otherwise stable.  PAST MEDICAL HISTORY: Past Medical History  Diagnosis Date  . 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 Anxiety and depression   . GERD (gastroesophageal reflux disease)   . Hyperlipidemia   . Hypothyroidism   . Osteoarthritis   . Carotid artery stenosis     bilateral  . CAD (coronary artery disease)     medical therapy, old tot RCA, patent LAD, mod. severe ostial LCX stenosis--PCI would be too complex-med mgmt recommended.  . OSA (obstructive sleep apnea)   . Hypertension   . Carotid artery occlusion   . Unspecified constipation 03/15/2012  . Bursitis   . Collagen vascular disease ([place])   . History of radiation therapy 10/31/12-11/13/12    rt&lt humerous 30Gy/88f  . Valvular heart disease 07/11/2013  . Diarrhea 09/28/2013  . Chronic renal insufficiency, stage II (mild)     Borderline stage II/III, CrCl 50s/60s.  . History of blood transfusion   . Urinary tract bacterial infections   .  Breast cancer ([place]) 05/2009    Grade 2 invasive ductal carcinoma, T2 NXM1, stage IV at presentation. There was bone only involvement. New liver lesions 12/2014--Dr. Aashvi Rezabek plans on abd MRI then bx of liver lesion to guide therapy.  . Diabetes mellitus type II     insulin  . Hypocalcemia 07/2014    required IV calcium in ED once and was admitted to hosp once.  ?  secondary to [facility] and everolimus?  . Drug-induced pneumonitis 08/2014    (Everolimus) CXR 09/21/14 improved; needs another CXR 2-3 wks later to make sure resolution occurs.    PAST SURGICAL HISTORY: Past Surgical History  Procedure Laterality Date  . Incisional breast biopsy      remoted left breast biopsy  . Cesarean section    . Other surgical history      GYN surgery  . Knee surgery      right knee surgery  . Carotid endarterectomy  2011    right carotid  . Port-a-cath removal    . Modified radical mastectomy  Feb 2012    Right breast  . Carotid stent      left  . Cardiac catheterization  06/2013    old total occ. of RCA, moderately severe ostial LCX stenosis- medical therapy- would be complex PCI  . Carotid angiogram N/A 03/01/2011    Procedure: CAROTID ANGIOGRAM;  Surgeon: Conrad Wynantskill, MD;  Location: [facility] CATH LAB;  Service: Cardiovascular;  Laterality: N/A;  . Arch aortogram  03/01/2011    Procedure: ARCH AORTOGRAM;  Surgeon: Conrad Montrose-Ghent, MD;  Location[facility] CATH LAB;  Service: Cardiovascular;;  . Carotid stent insertion Left 03/15/2011    Procedure: CAROTID STENT INSERTION;  Surgeon: Serafina Mitchell, MD;  Location[facility] CATH LAB;  Service: Cardiovascular;  Laterality: Left;  . Left heart catheterization with coronary angiogram N/A 07/07/2013    Procedure: LEFT HEART CATHETERIZATION WITH CORONARY ANGIOGRAM;  Surgeon: Blane Ohara, MD;  Location: [facility] CATH LAB;  Service: Cardiovascular;  Laterality: N/A;  . Carotid dopplers  05/2014    Patent ICAs, bilat ECA dz: no change compared to 2014  . Transthoracic echocardiogram  06/2013    EF 55-60%, grd I diast dysfxn, mod mitral regurg    FAMILY HISTORY Family History  Problem Relation Age of Onset  . Arthritis    . Coronary artery disease      first degree relative  . Stroke      first degree relative  . Diabetes Mother   . Heart disease Mother   . Hypertension Mother   . Cancer Mother   . Deep vein thrombosis Mother   . Colon  cancer Neg Hx   . Stomach cancer Neg Hx   . Cancer Father     Pancreatic cancer  . Breast cancer Paternal Aunt 21  The patient's father died from pancreatic cancer at the age of 93. The patient's mother died from complications of diabetes and heart disease at the age of 42. The patient has 2 sisters and 2 brothers. There is no history of breast cancer or ovarian cancer in the immediate family. One of the patient's paternal aunts out of 6 paternal aunts had breast cancer diagnosed in her 74s.   GYNECOLOGIC HISTORY: The patient is GX, P3. First pregnancy to term age 10. She went through the change of life in her late 76s. She never took hormones.   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 Her husband, [name], is disabled secondary to a fall.  He has difficulty with walking. He used to work for [employer] previously. The patient's son, [name], lives in [place], and works for [employer]. Daughter, [name], lives in [place], and works for [employer]. Daughter, [name], lives in [place], [place], and works as a Geologist, engineering. The patient attends [employer].  ADVANCED DIRECTIVES: Not on file; During her hospitalization the patient agreed to a DO NOT RESUSCITATE order. However that is now not current. Today (09/21/2014) I gave her the appropriate forms to consider and notarize at her discretion  HEALTH MAINTENANCE:  (Updated 04/23/2013) Social History  Substance Use Topics  . Smoking status: Former Smoker -- 1.00 packs/day for 20 years    Types: Cigarettes    Quit date: 01/31/1988  . Smokeless tobacco: Never Used  . Alcohol Use: No    Colonoscopy: Not on file  PAP: Not on file  Bone density: July 2011, Normal  Lipid panel: 06/26/2012  Allergies  Allergen Reactions  . Chlorhexidine Hives, Itching and Rash    This was most likely a CONTACT DERMATITIS versus true systemic allergic reaction  . Adhesive  [Tape] Hives  . Codeine Swelling    Current Outpatient Prescriptions  Medication Sig Dispense Refill  . ACCU-CHEK AVIVA PLUS test strip CHECK BLOOD SUGAR TWICE A DAY FOR DX:250.00 100 each 11  . amLODipine (NORVASC) 10 MG tablet Take 1 tablet (10 mg total) by mouth daily. 30 tablet 11  . aspirin 81 MG chewable tablet Chew 81 mg by mouth daily.    Marland Kitchen atorvastatin (LIPITOR) 80 MG tablet Take 1 tablet (80 mg total) by mouth daily. 30 tablet 11  . Blood Glucose Monitoring Suppl (ACCU-CHEK NANO SMARTVIEW) W/DEVICE KIT Use to check blood sugar twice a day. DX 250.00 1 kit 0  . calcium carbonate (TUMS) 500 MG chewable tablet Chew 1 tablet (200 mg of elemental calcium total) by mouth 3 (three) times daily with meals. 30 tablet 0  . carvedilol (COREG) 25 MG tablet Take 1 tablet (25 mg total) by mouth 2 (two) times daily with a meal. 60 tablet 8  . clopidogrel (PLAVIX) 75 MG tablet Take 1 tablet (75 mg total) by mouth daily. 30 tablet 8  . cyanocobalamin (,VITAMIN B-12,) 1000 MCG/ML injection Inject 1,000 mcg into the muscle every 30 (thirty) days.     . darbepoetin (ARANESP) 200 MCG/0.4ML SOLN Inject 300 mcg into the skin every 14 (fourteen) days.    Marland Kitchen exemestane (AROMASIN) 25 MG tablet Take 1 tablet (25 mg total) by mouth daily after breakfast. 30 tablet 2  . furosemide (LASIX) 40 MG tablet Take 1 tablet (40 mg total) by mouth 2 (two) times daily. 1 tab po bid 60 tablet 6  . gabapentin (NEURONTIN) 100 MG capsule Take 1 capsule (100 mg total) by mouth 3 (three) times daily. 90 capsule 2  . glycerin adult (GLYCERIN ADULT) 2 G SUPP Place 1 suppository rectally once as needed (constipation). 10 suppository 0  . insulin detemir (LEVEMIR) 100 UNIT/ML injection Inject 0.15 mLs (15 Units total) into the skin at bedtime. 10 mL 0  . insulin lispro (HUMALOG KWIKPEN) 100 UNIT/ML KiwkPen 2 U SQ qAM, 3 U SQ qLUNCH, and 3 U SQ qSupper 15 mL 11  . isosorbide mononitrate (IMDUR) 60 MG 24 hr tablet Take 1 tablet (60 mg  total) by mouth 2 (two) times daily. 60 tablet 11  . levothyroxine (SYNTHROID, LEVOTHROID) 150 MCG tablet 1  tab po qd except take 1 and 1/2 tabs on Mondays and Fridays 34 tablet 1  . LORazepam (ATIVAN) 0.5 MG tablet Take one as needed for anxiety and a second tablet prn. 2 tablet 0  . mirtazapine (REMERON) 15 MG tablet Take 1 tablet (15 mg total) by mouth at bedtime. 30 tablet 3  . morphine (MS CONTIN) 30 MG 12 hr tablet Take 1 tablet (30 mg total) by mouth 2 (two) times daily. 60 tablet 0  . nitroGLYCERIN (NITROSTAT) 0.4 MG SL tablet Place 1 tablet (0.4 mg total) under the tongue every 5 (five) minutes as needed for chest pain. 30 tablet 0  . omeprazole (PRILOSEC) 40 MG capsule Take 1 capsule (40 mg total) by mouth daily. 30 capsule 3  . ondansetron (ZOFRAN) 4 MG tablet Take 1 tablet (4 mg total) by mouth every 8 (eight) hours as needed for nausea or vomiting. 100 tablet 0  . polyethylene glycol (MIRALAX / GLYCOLAX) packet Take 17 g by mouth daily as needed for mild constipation.     . potassium chloride SA (K-DUR,KLOR-CON) 20 MEQ tablet Take 2 tablets (40 mEq total) by mouth daily. 60 tablet 3  . prochlorperazine (COMPAZINE) 10 MG tablet Take 1 tablet (10 mg total) by mouth every 6 (six) hours as needed for nausea or vomiting. 30 tablet 1  . senna-docusate (SENOKOT-S) 8.6-50 MG per tablet Take 1 tablet by mouth at bedtime.     . [DISCONTINUED] simvastatin (ZOCOR) 40 MG tablet Take 40 mg by mouth every evening.     No current facility-administered medications for this visit.    OBJECTIVE: Elderly white woman who appears stated age  5 Vitals:   02/12/15 1422  BP: 128/36  Pulse: 70  Temp: 98.5 F (36.9 C)  Resp: 17     Body mass index is 28.84 kg/(m^2).    ECOG FS: 2 Filed Weights   02/12/15 1422  Weight: 157 lb 11.2 oz (71.532 kg)   Sclerae unicteric, pupils round and equal Oropharynx clear and moist-- no thrush or other lesions No cervical or supraclavicular adenopathy, no focal  cervical spine tenderness Lungs no rales or rhonchi Heart regular rate and rhythm Abd soft, nontender, positive bowel sounds MSK no focal thoracic or lumbar spinal tenderness, no upper extremity lymphedema Neuro: nonfocal, well oriented, positive affect Breasts: Deferred    LAB RESULTS: Lab Results  Component Value Date   WBC 4.2 02/12/2015   NEUTROABS 2.6 02/12/2015   HGB 9.5* 02/12/2015   HCT 30.2* 02/12/2015   MCV 91.5 02/12/2015   PLT 206 02/12/2015      Chemistry      Component Value Date/Time   NA 143 02/05/2015 0846   NA 137 01/11/2015 0942   K 3.9 02/05/2015 0846   K 4.7 01/11/2015 0942   CL 100* 02/05/2015 0846   CL 104 07/08/2012 1023   CO2 25 02/05/2015 0846   CO2 23 01/11/2015 0942   BUN 43* 02/05/2015 0846   BUN 54.2* 01/11/2015 0942   CREATININE 0.97 02/05/2015 0846   CREATININE 1.5* 01/11/2015 0942   CREATININE 1.37* 12/21/2014 0810      Component Value Date/Time   CALCIUM 8.9 02/05/2015 0846   CALCIUM 9.6 01/11/2015 0942   ALKPHOS 124 02/05/2015 0846   ALKPHOS 120 01/11/2015 0942   AST 28 02/05/2015 0846   AST 27 01/11/2015 0942   ALT 13* 02/05/2015 0846   ALT 9 01/11/2015 0942   BILITOT 1.2 02/05/2015 0846   BILITOT 0.48 01/11/2015  0942      STUDIES: Mr Abdomen W Wo Contrast  02/04/2015  CLINICAL DATA:  75 year old female with history of breast cancer with bone metastasis. New liver lesions. EXAM: MRI ABDOMEN WITHOUT AND WITH CONTRAST TECHNIQUE: Multiplanar multisequence MR imaging of the abdomen was performed both before and after the administration of intravenous contrast. CONTRAST:  46m MULTIHANCE GADOBENATE DIMEGLUMINE 529 MG/ML IV SOLN COMPARISON:  None. FINDINGS: Lower chest: Again noted is a plaque-like area of pleural thickening in the lateral aspect of the lower right hemithorax measuring approximately 2.2 x 0.8 cm (image 15 of series 1201). Hepatobiliary: There are numerous (greater than 20) hepatic lesions on today's study which are T1  hypointense, mildly T2 hyperintense, restrict diffusion, and demonstrates progressive central enhancement on post gadolinium images, compatible with multiple new metastatic lesions. These are best demonstrated on precontrast T1 weighted images, with the largest of these lesions measures up to 1.3 x 1.7 cm (image 57 of series 1200) in segment 3 of the liver anteriorly. No intra or extrahepatic biliary ductal dilatation. Numerous tiny filling defects in the gallbladder are compatible with small gallstones. No current findings to suggest an acute cholecystitis at this time. Pancreas: Diffuse fatty atrophy in the pancreas. No pancreatic mass. No pancreatic ductal dilatation. No pancreatic or peripancreatic fluid or inflammatory changes. Spleen: Unremarkable. Adrenals/Urinary Tract: Several sub cm lesions are noted in the kidneys bilaterally, T1 hypointense, T2 hyperintense and nonenhancing, compatible with tiny cysts. No hydroureteronephrosis in the visualized abdomen. Bilateral adrenal glands are normal in appearance. Stomach/Bowel: Visualized portions are unremarkable. Vascular/Lymphatic: No aneurysm identified in the visualized abdominal vasculature. Extensive atherosclerotic disease throughout the abdominal aorta. No lymphadenopathy noted in the abdomen. Other: No significant volume of ascites noted in the visualized peritoneal cavity. Musculoskeletal: No definite osseous lesions identified in the visualized portions of the skeleton on today's examination. IMPRESSION: 1. Numerous new hepatic lesions throughout all aspects of the liver measuring up to 1.3 x 1.7 cm in segment 3 of the liver, compatible with widespread hepatic metastases. 2. Plaque-like area of enhancing pleural thickening in the inferior aspect of the right hemithorax again noted. This is nonspecific, but pleural metastasis is not excluded, and continued attention on followup studies is recommended. 3. Cholelithiasis without evidence of acute  cholecystitis at this time. 4. Additional incidental findings, as above. Electronically Signed   By: DVinnie LangtonM.D.   On: 02/04/2015 08:18   [place]  02/05/2015  CLINICAL DATA:  History of breast carcinoma metastatic to bone. Imaging has revealed multiple new liver lesions and the patient presents for liver biopsy. EXAM: ULTRASOUND GUIDED CORE BIOPSY OF LIVER MEDICATIONS: 1.0 mg IV Versed; 50 mcg IV Fentanyl Total Moderate Sedation Time: 14 minutes. PROCEDURE: The procedure, risks, benefits, and alternatives were explained to the patient. Questions regarding the procedure were encouraged and answered. The patient understands and consents to the procedure. The epigastric region was prepped with Betadine in a sterile fashion, and a sterile drape was applied covering the operative field. A sterile gown and sterile gloves were used for the procedure. Local anesthesia was provided with 1% Lidocaine. Ultrasound was used to localize liver lesions. Under direct ultrasound guidance, a 17 gauge needle was advanced into the left lobe of the liver. Three separate core biopsy samples were obtained and submitted in formalin. The outer needle was removed and additional ultrasound performed. COMPLICATIONS: None. FINDINGS: Multiple small lesions are identified throughout the liver which are predominantly mildly hypoechoic relative to adjacent liver parenchyma. A lesion in  the lateral segment of the left lobe of the liver was chosen for sampling and measures approximately 1.2 x 1.7 cm. Solid tissue was obtained. IMPRESSION: Ultrasound-guided core biopsy performed of one of several lesions in the liver. A lesion measuring 1.7 cm in greatest diameter within the left lobe of the liver was sampled. Breast prognostic studies were requested. Electronically Signed   By: Irish Lack M.D.   On: 02/05/2015 13:39    ASSESSMENT: 75 y.o. Brittney Cunningham woman:   (1) Status post right breast biopsy in 05/2009 for a grade 2 invasive  ductal carcinoma, T2 NX M1, Stage IV, strongly estrogen and progesterone receptor-positive, HER-2-negative with an MIB-1 of 26%.   (2) Neoadjuvantly she received Letrozole and zoledronic acid beginning in June of 2011 and underwent right modified radical mastectomy February of 2012 for a ypT2 ypN2, grade 1 invasive ductal carcinoma with negative margins.   (3) She completed radiation therapy in August of 2012.   (4) She has continued on Letrozole but was switched from Zoledronic acid to Denosumab Rivka Barbara) because of concerns regarding her serum creatinine. Rivka Barbara was being given every 8 weeks.  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  (6) symptomatic anemia, with creatinine clearance < 60 cc/min; Darbepoietin Q14d started 11/03/2011; received Feraheme 01/05/2012; ferritin August 2016 was greater than 800  (7) On subcutaneous B-12 supplementation monthly.  (8) Pain from osseous metastasis, osteoarthritis, tendinopathy, and bursitis as confirmed by recent MRI.  (9) Anemia, multifactorial with renal disease, on Aranesp monthly, increased to every 2 weeks June 2016  (10)  letrozole was discontinued in September 2014 with evidence of disease progression. She was started on fulvestrant injections, first given on 10/17/2012; stopped 06/03/2014 with progression  (11) exemestane and everolimus started 07/08/2014  (a) everolimus discontinued August 2016 with pneumonitis  (b) exemestane held at the start of everolimus January 2017  (12) liver biopsy 02/05/2015 confirms metastatic breast cancer to the liver, estrogen and progesterone receptor positive, HER-2 negative  (13) eribulin starting 02/17/2015, to be given days 1 and 8 of each 21 day cycle   PLAN: I met today with Railynn and her son and granddaughter. They understand her disease is not curable. It is however treatable  area we also discussed the fact that there can be an frequently is a tension between interventions that improve quality of life and interventions that lengthen life. More specifically chemotherapy can compromise quality of life in the interest of longer life.  This gets [place] to the first question we always ask in this setting which is whether we should treat her or not or concentrated in the Comfort Care mode. Keelia is very clear she wants to be treated and is willing to be treated intensively if that is what it takes.  Question is whether we should go with further antiestrogen manipulations, such as exemestane plus Palbociclib, or whether we should go to chemotherapy. My own view is that this is a good time to try chemotherapy, with the goal being to control and if possible shrink the liver metastases after which we could consider switching to anti-estrogens in a maintenance mode.  Shaindel is very much in favor of this plan. The question that is which chemotherapy and we have many many options including CMF, Doxil, capecitabine, etc. I would like to start with eribulin in today we talked about the possible toxicities, side effects and complications of  this agent. In particular Elaria already has some neuropathy involving her toes from her diabetes and we would not want to worsen this. This will require close monitoring.  She really has a port in place. We are going to start her treatments on January 18. She will receive treatment on the first and eighth day of each 21 day cycle. The plan is to go to 3 or 4 cycles before restaging  She has a good understanding of the overall plan. She agrees with it. She will call with any problems that may develop before next visit here.  Chauncey Cruel, MD  02/12/2015  2:30 PM

## 2015-02-12 NOTE — Telephone Encounter (Signed)
Appointments made and avs will be printed in chemo  °

## 2015-02-12 NOTE — Patient Instructions (Signed)

## 2015-02-13 LAB — CANCER ANTIGEN 27-29 (PARALLEL TESTING): CA 27.29: 187 U/mL — AB (ref 0–39)

## 2015-02-13 LAB — CANCER ANTIGEN 27.29: CAN 27.29: 209.8 U/mL — AB (ref 0.0–38.6)

## 2015-02-14 ENCOUNTER — Telehealth: Payer: Self-pay | Admitting: Nurse Practitioner

## 2015-02-14 NOTE — Telephone Encounter (Signed)
Spoke with patient and she is aware of her added flush 1/18

## 2015-02-17 ENCOUNTER — Encounter: Payer: Self-pay | Admitting: Nurse Practitioner

## 2015-02-17 ENCOUNTER — Ambulatory Visit: Payer: Medicare Other

## 2015-02-17 ENCOUNTER — Ambulatory Visit (HOSPITAL_BASED_OUTPATIENT_CLINIC_OR_DEPARTMENT_OTHER): Payer: Medicare Other | Admitting: Nurse Practitioner

## 2015-02-17 ENCOUNTER — Other Ambulatory Visit: Payer: Self-pay | Admitting: Nurse Practitioner

## 2015-02-17 ENCOUNTER — Other Ambulatory Visit: Payer: Self-pay

## 2015-02-17 ENCOUNTER — Ambulatory Visit (HOSPITAL_BASED_OUTPATIENT_CLINIC_OR_DEPARTMENT_OTHER): Payer: Medicare Other

## 2015-02-17 ENCOUNTER — Telehealth: Payer: Self-pay | Admitting: Nurse Practitioner

## 2015-02-17 ENCOUNTER — Other Ambulatory Visit (HOSPITAL_BASED_OUTPATIENT_CLINIC_OR_DEPARTMENT_OTHER): Payer: Medicare Other

## 2015-02-17 VITALS — BP 126/48 | HR 71 | Temp 97.9°F | Resp 18 | Ht 62.0 in | Wt 158.0 lb

## 2015-02-17 DIAGNOSIS — D631 Anemia in chronic kidney disease: Secondary | ICD-10-CM

## 2015-02-17 DIAGNOSIS — C7951 Secondary malignant neoplasm of bone: Principal | ICD-10-CM

## 2015-02-17 DIAGNOSIS — C50911 Malignant neoplasm of unspecified site of right female breast: Secondary | ICD-10-CM | POA: Diagnosis not present

## 2015-02-17 DIAGNOSIS — C787 Secondary malignant neoplasm of liver and intrahepatic bile duct: Secondary | ICD-10-CM

## 2015-02-17 DIAGNOSIS — C50811 Malignant neoplasm of overlapping sites of right female breast: Secondary | ICD-10-CM

## 2015-02-17 DIAGNOSIS — D649 Anemia, unspecified: Secondary | ICD-10-CM | POA: Diagnosis not present

## 2015-02-17 DIAGNOSIS — C50919 Malignant neoplasm of unspecified site of unspecified female breast: Secondary | ICD-10-CM

## 2015-02-17 DIAGNOSIS — Z170 Estrogen receptor positive status [ER+]: Secondary | ICD-10-CM

## 2015-02-17 DIAGNOSIS — Z95828 Presence of other vascular implants and grafts: Secondary | ICD-10-CM

## 2015-02-17 DIAGNOSIS — N189 Chronic kidney disease, unspecified: Secondary | ICD-10-CM

## 2015-02-17 LAB — CBC WITH DIFFERENTIAL/PLATELET
BASO%: 0.7 % (ref 0.0–2.0)
BASOS ABS: 0 10*3/uL (ref 0.0–0.1)
EOS%: 2.4 % (ref 0.0–7.0)
Eosinophils Absolute: 0.1 10*3/uL (ref 0.0–0.5)
HCT: 30.6 % — ABNORMAL LOW (ref 34.8–46.6)
HGB: 9.7 g/dL — ABNORMAL LOW (ref 11.6–15.9)
LYMPH%: 24.5 % (ref 14.0–49.7)
MCH: 29.3 pg (ref 25.1–34.0)
MCHC: 31.8 g/dL (ref 31.5–36.0)
MCV: 92.2 fL (ref 79.5–101.0)
MONO#: 0.6 10*3/uL (ref 0.1–0.9)
MONO%: 9.8 % (ref 0.0–14.0)
NEUT#: 3.6 10*3/uL (ref 1.5–6.5)
NEUT%: 62.6 % (ref 38.4–76.8)
Platelets: 207 10*3/uL (ref 145–400)
RBC: 3.31 10*6/uL — ABNORMAL LOW (ref 3.70–5.45)
RDW: 15.5 % — ABNORMAL HIGH (ref 11.2–14.5)
WBC: 5.7 10*3/uL (ref 3.9–10.3)
lymph#: 1.4 10*3/uL (ref 0.9–3.3)

## 2015-02-17 LAB — COMPREHENSIVE METABOLIC PANEL
ALT: 12 U/L (ref 0–55)
AST: 27 U/L (ref 5–34)
Albumin: 3.2 g/dL — ABNORMAL LOW (ref 3.5–5.0)
Alkaline Phosphatase: 152 U/L — ABNORMAL HIGH (ref 40–150)
Anion Gap: 17 mEq/L — ABNORMAL HIGH (ref 3–11)
BUN: 47.4 mg/dL — AB (ref 7.0–26.0)
CHLORIDE: 95 meq/L — AB (ref 98–109)
CO2: 23 mEq/L (ref 22–29)
Calcium: 8.7 mg/dL (ref 8.4–10.4)
Creatinine: 1.3 mg/dL — ABNORMAL HIGH (ref 0.6–1.1)
EGFR: 42 mL/min/{1.73_m2} — ABNORMAL LOW (ref >90)
GLUCOSE: 160 mg/dL — AB (ref 70–140)
POTASSIUM: 4 meq/L (ref 3.5–5.1)
SODIUM: 135 meq/L — AB (ref 136–145)
Total Bilirubin: 0.56 mg/dL (ref 0.20–1.20)
Total Protein: 7.2 g/dL (ref 6.4–8.3)

## 2015-02-17 LAB — FERRITIN: Ferritin: 1107 ng/ml — ABNORMAL HIGH (ref 9–269)

## 2015-02-17 MED ORDER — PALONOSETRON HCL INJECTION 0.25 MG/5ML
INTRAVENOUS | Status: AC
  Filled 2015-02-17: qty 5

## 2015-02-17 MED ORDER — SODIUM CHLORIDE 0.9 % IV SOLN
1.4000 mg/m2 | Freq: Once | INTRAVENOUS | Status: AC
  Administered 2015-02-17: 2.5 mg via INTRAVENOUS
  Filled 2015-02-17: qty 5

## 2015-02-17 MED ORDER — SODIUM CHLORIDE 0.9 % IJ SOLN
10.0000 mL | INTRAMUSCULAR | Status: DC | PRN
  Administered 2015-02-17: 10 mL via INTRAVENOUS
  Filled 2015-02-17: qty 10

## 2015-02-17 MED ORDER — PALONOSETRON HCL INJECTION 0.25 MG/5ML
0.2500 mg | Freq: Once | INTRAVENOUS | Status: AC
  Administered 2015-02-17: 0.25 mg via INTRAVENOUS

## 2015-02-17 MED ORDER — SODIUM CHLORIDE 0.9 % IV SOLN
Freq: Once | INTRAVENOUS | Status: AC
  Administered 2015-02-17: 13:00:00 via INTRAVENOUS

## 2015-02-17 MED ORDER — SODIUM CHLORIDE 0.9 % IJ SOLN
10.0000 mL | INTRAMUSCULAR | Status: DC | PRN
  Administered 2015-02-17: 10 mL
  Filled 2015-02-17: qty 10

## 2015-02-17 MED ORDER — SODIUM CHLORIDE 0.9 % IV SOLN
10.0000 mg | Freq: Once | INTRAVENOUS | Status: AC
  Administered 2015-02-17: 10 mg via INTRAVENOUS
  Filled 2015-02-17: qty 1

## 2015-02-17 MED ORDER — HEPARIN SOD (PORK) LOCK FLUSH 100 UNIT/ML IV SOLN
500.0000 [IU] | Freq: Once | INTRAVENOUS | Status: AC | PRN
  Administered 2015-02-17: 500 [IU]
  Filled 2015-02-17: qty 5

## 2015-02-17 NOTE — Patient Instructions (Signed)

## 2015-02-17 NOTE — Patient Instructions (Addendum)
[facility] Discharge Instructions for Patients Receiving Chemotherapy  Today you received the following chemotherapy agents [place]. To help prevent nausea and vomiting after your treatment, we encourage you to take your nausea medication as directed.   If you develop nausea and vomiting that is not controlled by your nausea medication, call the clinic.   BELOW ARE SYMPTOMS THAT SHOULD BE REPORTED IMMEDIATELY:  *FEVER GREATER THAN 100.5 F  *CHILLS WITH OR WITHOUT FEVER  NAUSEA AND VOMITING THAT IS NOT CONTROLLED WITH YOUR NAUSEA MEDICATION  *UNUSUAL SHORTNESS OF BREATH  *UNUSUAL BRUISING OR BLEEDING  TENDERNESS IN MOUTH AND THROAT WITH OR WITHOUT PRESENCE OF ULCERS  *URINARY PROBLEMS  *BOWEL PROBLEMS  UNUSUAL RASH Items with * indicate a potential emergency and should be followed up as soon as possible.  Feel free to call the clinic you have any questions or concerns. The clinic phone number is [phone].  Please show the [facility] at check-in to the Emergency Department and triage nurse.   Eribulin solution for injection What is this medicine? ERIBULIN (er e bu lin) is a chemotherapy drug. It is used to treat breast cancer and liposarcoma. This medicine may be used for other purposes; ask your health care provider or pharmacist if you have questions. What should I tell my health care provider before I take this medicine? They need to know if you have any of these conditions: -heart disease -history of irregular heartbeat -kidney disease -liver disease -low blood counts, like low white cell, platelet, or red cell counts -low levels of potassium or magnesium in the blood -an unusual or allergic reaction to eribulin, other medicines, foods, dyes, or preservatives -pregnant or trying to get pregnant -breast-feeding How should I use this medicine? This medicine is for infusion into a vein. It is given by a health care professional in a hospital  or clinic setting. Talk to your pediatrician regarding the use of this medicine in children. Special care may be needed. Overdosage: If you think you have taken too much of this medicine contact a poison control center or emergency room at once. NOTE: This medicine is only for you. Do not share this medicine with others. What if I miss a dose? It is important not to miss your dose. Call your doctor or health care professional if you are unable to keep an appointment. What may interact with this medicine? Do not take this medicine with any of the following medications: -amiodarone -astemizole -arsenic trioxide -bepridil -bretylium -chloroquine -chlorpromazine -cisapride -clarithromycin -dextromethorphan, quinidine -disopyramide -dofetilide -droperidol -dronedarone -erythromycin -grepafloxacin -halofantrine -haloperidol -ibutilide -levomethadyl -mesoridazine -methadone -pentamidine -procainamide -quinidine -pimozide -posaconazole -probucol -propafenone -saquinavir -sotalol -sparfloxacin -terfenadine -thioridazine -troleandomycin -ziprasidone This list may not describe all possible interactions. Give your health care provider a list of all the medicines, herbs, non-prescription drugs, or dietary supplements you use. Also tell them if you smoke, drink alcohol, or use illegal drugs. Some items may interact with your medicine. What should I watch for while using this medicine? This drug may make you feel generally unwell. This is not uncommon, as chemotherapy can affect healthy cells as well as cancer cells. Report any side effects. Continue your course of treatment even though you feel ill unless your doctor tells you to stop. Call your doctor or health care professional for advice if you get a fever, chills or sore throat, or other symptoms of a cold or flu. Do not treat yourself. This drug decreases your body's ability to fight infections.  Try to avoid being around people  who are sick. This medicine may increase your risk to bruise or bleed. Call your doctor or health care professional if you notice any unusual bleeding. You may need blood work done while you are taking this medicine. Do not become pregnant while taking this medicine or for 2 weeks after stopping it. Women should inform their doctor if they wish to become pregnant or think they might be pregnant. Men should not father a child while taking this medicine and for 3.5 months after stopping it. There is a potential for serious side effects to an unborn child. Talk to your health care professional or pharmacist for more information. Do not breast-feed an infant while taking this medicine or for 2 weeks after stopping it. What side effects may I notice from receiving this medicine? Side effects that you should report to your doctor or health care professional as soon as possible: -allergic reactions like skin rash, itching or hives, swelling of the face, lips, or tongue -low blood counts - this medicine may decrease the number of white blood cells, red blood cells and platelets. You may be at increased risk for infections and bleeding. -signs of infection - fever or chills, cough, sore throat, pain or difficulty passing urine -signs of decreased platelets or bleeding - bruising, pinpoint red spots on the skin, black, tarry stools, blood in the urine -signs of decreased red blood cells - unusually weak or tired, fainting spells, lightheadedness -pain, tingling, numbness in the hands or feet Side effects that usually do not require medical attention (Report these to your doctor or health care professional if they continue or are bothersome.): -constipation -hair loss -headache -loss of appetite -muscle or joint pain -nausea, vomiting -stomach pain This list may not describe all possible side effects. Call your doctor for medical advice about side effects. You may report side effects to FDA at  1-800-FDA-1088. Where should I keep my medicine? This drug is given in a hospital or clinic and will not be stored at home. NOTE: This sheet is a summary. It may not cover all possible information. If you have questions about this medicine, talk to your doctor, pharmacist, or health care provider.    2016, [employer]. (2014-03-04 17:51:40)

## 2015-02-17 NOTE — Progress Notes (Signed)
Patient ID: Brittney Cunningham, female   DOB: 09-Jun-1940, 75 y.o.   MRN: [id number]  [place]  [phone] [phone]  OFFICE PROGRESS NOTE   ID: Brittney Cunningham   DOB: 01-09-1941  MR#: [id number]  GGY#:[id number]   PCP: [name], MD SU: Rolm Bookbinder OTHER MD: Teena Dunk, Kyung Rudd, Kevin Supple, Peter Martinique  CHIEF COMPLAINT:  Metastatic Breast Cancer  CURRENT TREATMENT: aranesp bimonthly, B-12 monthly, denosumab every 12 weeks, eribulin  BREAST CANCER HISTORY:  from the earlier summary:  Rosalynn was diagnosed with right breast carcinoma in May of 2011, a biopsy showing a grade 2 invasive ductal carcinoma, T2 NXM1, stage IV at presentation. There was bone only involvement. Tumor was strongly ER and PR positive, HER-2/neu negative, with an MIP-1 of 26%.  She was treated neoadjuvantly with letrozole and zoledronic acid beginning in June of 2011. She is status post right modified radical mastectomy in February 2012 for a ypT2 ypN2, grade 1 invasive ductal carcinoma. Margins were negative.   Her subsequent history is as detailed below  INTERVAL HISTORY: Joice returns today for follow up of her metastatic breast cancer, with her 2 granddaughters. She is due to start day 1, cycle 1 of eribulin, which is given on days 1 and 8 of every 21 day cycle.  REVIEW OF SYSTEMS: Neysa's blood pressure is still low despite cuts to her medicines. She denies fevers or chills. She has some mild nausea, but no vomiting. Her bowels are moving well despite narcotic use. She continues on MS contin with MSIR for breakthrough pain, and she has needed less of the MSIR lately. She has pain to her neck, back, and bilateral arms. Her appetite is down. She denies shortness of breath, chest pina, cough, or palpitations. She has mild neuropathy at her toes. She has occasional headaches, she denies vision changes. She uses a cane to ambulate. A detailed review of systems is  otherwise stable.   PAST MEDICAL HISTORY: Past Medical History  Diagnosis Date  . 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 Anxiety and depression   . GERD (gastroesophageal reflux disease)   . Hyperlipidemia   . Hypothyroidism   . Osteoarthritis   . Carotid artery stenosis     bilateral  . CAD (coronary artery disease)     medical therapy, old tot RCA, patent LAD, mod. severe ostial LCX stenosis--PCI would be too complex-med mgmt recommended.  . OSA (obstructive sleep apnea)   . Hypertension   . Carotid artery occlusion   . Unspecified constipation 03/15/2012  . Bursitis   . Collagen vascular disease ([place])   . History of radiation therapy 10/31/12-11/13/12    rt&lt humerous 30Gy/47f  . Valvular heart disease 07/11/2013  . Diarrhea 09/28/2013  . Chronic renal insufficiency, stage II (mild)     Borderline stage II/III, CrCl 50s/60s.  . History of blood transfusion   . Urinary tract bacterial infections   . [place]    Grade 2 invasive ductal carcinoma, T2 NXM1, stage IV at presentation. There was bone only involvement. New liver lesions 12/2014--Dr. Magrinat plans on abd MRI then bx of liver lesion to guide therapy.  . Diabetes mellitus type II     insulin  . Hypocalcemia 07/2014    required IV calcium in ED once and was admitted to hosp once.  ? [facility]?  . Drug-induced pneumonitis 08/2014    (  Everolimus) CXR 09/21/14 improved; needs another CXR 2-3 wks later to make sure resolution occurs.    PAST SURGICAL HISTORY: Past Surgical History  Procedure Laterality Date  . Incisional breast biopsy      remoted left breast biopsy  . Cesarean section    . Other surgical history      GYN surgery  . Knee surgery      right knee surgery  . Carotid endarterectomy  2011    right carotid  . Port-a-cath removal    . Modified radical mastectomy  Feb 2012    Right breast  . Carotid stent      left  . Cardiac  catheterization  06/2013    old total occ. of RCA, moderately severe ostial LCX stenosis- medical therapy- would be complex PCI  . Carotid angiogram N/A 03/01/2011    Procedure: CAROTID ANGIOGRAM;  Surgeon: Conrad Pine Level, MD;  Location: [facility] CATH LAB;  Service: Cardiovascular;  Laterality: N/A;  . Arch aortogram  03/01/2011    Procedure: ARCH AORTOGRAM;  Surgeon: Conrad Hordville, MD;  Location: [facility] CATH LAB;  Service: Cardiovascular;;  . Carotid stent insertion Left 03/15/2011    Procedure: CAROTID STENT INSERTION;  Surgeon: Serafina Mitchell, MD;  Location[facility] CATH LAB;  Service: Cardiovascular;  Laterality: Left;  . Left heart catheterization with coronary angiogram N/A 07/07/2013    Procedure: LEFT HEART CATHETERIZATION WITH CORONARY ANGIOGRAM;  Surgeon: Blane Ohara, MD;  Location[facility] CATH LAB;  Service: Cardiovascular;  Laterality: N/A;  . Carotid dopplers  05/2014    Patent ICAs, bilat ECA dz: no change compared to 2014  . Transthoracic echocardiogram  06/2013    EF 55-60%, grd I diast dysfxn, mod mitral regurg    FAMILY HISTORY Family History  Problem Relation Age of Onset  . Arthritis    . Coronary artery disease      first degree relative  . Stroke      first degree relative  . Diabetes Mother   . Heart disease Mother   . Hypertension Mother   . Cancer Mother   . Deep vein thrombosis Mother   . Colon cancer Neg Hx   . Stomach cancer Neg Hx   . Cancer Father     Pancreatic cancer  . Breast cancer Paternal Aunt 10  The patient's father died from pancreatic cancer at the age of 11. The patient's mother died from complications of diabetes and heart disease at the age of 72. The patient has 2 sisters and 2 brothers. There is no history of breast cancer or ovarian cancer in the immediate family. One of the patient's paternal aunts out of 6 paternal aunts had breast cancer diagnosed in her 36s.   GYNECOLOGIC HISTORY: The patient is GX, P3. First pregnancy to term age 32. She went through  the change of life in her late 87s. She never took hormones.   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 Her husband, [name], is disabled secondary to a fall.  He has difficulty with walking. He used to work for [employer] previously. The patient's son, [name], lives in [place], and works for [employer]. Daughter, [name], lives in [place], and works for [employer]. Daughter, [name], lives in [place], [place], and works as a Geologist, engineering. The patient attends [employer].  ADVANCED DIRECTIVES: Not on file; During her hospitalization the patient agreed to a DO NOT RESUSCITATE order. However  that is now not current. Today (09/21/2014) I gave her the appropriate forms to consider and notarize at her discretion  HEALTH MAINTENANCE:  (Updated 04/23/2013) Social History  Substance Use Topics  . Smoking status: Former Smoker -- 1.00 packs/day for 20 years    Types: Cigarettes    Quit date: 01/31/1988  . Smokeless tobacco: Never Used  . Alcohol Use: No    Colonoscopy: Not on file  PAP: Not on file  Bone density: July 2011, Normal  Lipid panel: 06/26/2012  Allergies  Allergen Reactions  . Chlorhexidine Hives, Itching and Rash    This was most likely a CONTACT DERMATITIS versus true systemic allergic reaction  . Adhesive [Tape] Hives  . Codeine Swelling    Current Outpatient Prescriptions  Medication Sig Dispense Refill  . ACCU-CHEK AVIVA PLUS test strip CHECK BLOOD SUGAR TWICE A DAY FOR DX:250.00 100 each 11  . aspirin 81 MG chewable tablet Chew 81 mg by mouth daily.    Marland Kitchen atorvastatin (LIPITOR) 80 MG tablet Take 1 tablet (80 mg total) by mouth daily. 30 tablet 11  . Blood Glucose Monitoring Suppl (ACCU-CHEK NANO SMARTVIEW) W/DEVICE KIT Use to check blood sugar twice a day. DX 250.00 1 kit 0  . calcium carbonate (TUMS) 500 MG chewable tablet Chew 1 tablet (200 mg of elemental calcium total) by mouth 3  (three) times daily with meals. 30 tablet 0  . carvedilol (COREG) 25 MG tablet Take 1 tablet (25 mg total) by mouth 2 (two) times daily with a meal. 60 tablet 8  . clopidogrel (PLAVIX) 75 MG tablet Take 1 tablet (75 mg total) by mouth daily. 30 tablet 8  . cyanocobalamin (,VITAMIN B-12,) 1000 MCG/ML injection Inject 1,000 mcg into the muscle every 30 (thirty) days.     . furosemide (LASIX) 40 MG tablet Take 1 tablet (40 mg total) by mouth 2 (two) times daily. 1 tab po bid 60 tablet 6  . gabapentin (NEURONTIN) 100 MG capsule Take 1 capsule (100 mg total) by mouth 3 (three) times daily. 90 capsule 2  . insulin detemir (LEVEMIR) 100 UNIT/ML injection Inject 0.15 mLs (15 Units total) into the skin at bedtime. 10 mL 0  . insulin lispro (HUMALOG KWIKPEN) 100 UNIT/ML KiwkPen 2 U SQ qAM, 3 U SQ qLUNCH, and 3 U SQ qSupper 15 mL 11  . levothyroxine (SYNTHROID, LEVOTHROID) 150 MCG tablet 1 tab po qd except take 1 and 1/2 tabs on Mondays and Fridays 34 tablet 1  . lidocaine-prilocaine (EMLA) cream Apply to affected area once 30 g 3  . LORazepam (ATIVAN) 0.5 MG tablet Take one as needed for anxiety and a second tablet prn. 2 tablet 0  . mirtazapine (REMERON) 15 MG tablet Take 1 tablet (15 mg total) by mouth at bedtime. 30 tablet 3  . morphine (MS CONTIN) 30 MG 12 hr tablet Take 1 tablet (30 mg total) by mouth 3 (three) times daily. 90 tablet 0  . morphine (MSIR) 15 MG tablet Take 1 tablet (15 mg total) by mouth every 4 (four) hours as needed for severe pain. 60 tablet 0  . omeprazole (PRILOSEC) 40 MG capsule Take 1 capsule (40 mg total) by mouth daily. 30 capsule 3  . ondansetron (ZOFRAN) 8 MG tablet Take 1 tablet (8 mg total) by mouth 2 (two) times daily. Start the day after chemo for 2 days. Then take as needed for nausea or vomiting. 30 tablet 1  . potassium chloride SA (K-DUR,KLOR-CON) 20 MEQ tablet Take 2  tablets (40 mEq total) by mouth daily. 60 tablet 3  . prochlorperazine (COMPAZINE) 10 MG tablet Take 1  tablet (10 mg total) by mouth every 6 (six) hours as needed (Nausea or vomiting). 30 tablet 1  . senna-docusate (SENOKOT-S) 8.6-50 MG per tablet Take 1 tablet by mouth at bedtime.     . darbepoetin (ARANESP) 200 MCG/0.4ML SOLN Inject 300 mcg into the skin every 14 (fourteen) days.    Marland Kitchen glycerin adult (GLYCERIN ADULT) 2 G SUPP Place 1 suppository rectally once as needed (constipation). (Patient not taking: Reported on 02/17/2015) 10 suppository 0  . nitroGLYCERIN (NITROSTAT) 0.4 MG SL tablet Place 1 tablet (0.4 mg total) under the tongue every 5 (five) minutes as needed for chest pain. (Patient not taking: Reported on 02/17/2015) 30 tablet 0  . ondansetron (ZOFRAN) 4 MG tablet Take 1 tablet (4 mg total) by mouth every 8 (eight) hours as needed for nausea or vomiting. (Patient not taking: Reported on 02/17/2015) 100 tablet 0  . polyethylene glycol (MIRALAX / GLYCOLAX) packet Take 17 g by mouth daily as needed for mild constipation. Reported on 02/17/2015    . [DISCONTINUED] simvastatin (ZOCOR) 40 MG tablet Take 40 mg by mouth every evening.     No current facility-administered medications for this visit.   Facility-Administered Medications Ordered in Other Visits  Medication Dose Route Frequency Provider Last Rate Last Dose  . dexamethasone (DECADRON) 10 mg in sodium chloride 0.9 % 50 mL IVPB  10 mg Intravenous Once Chauncey Cruel, MD      . eriBULin mesylate (HALAVEN) 2.5 mg in sodium chloride 0.9 % 100 mL chemo infusion  1.4 mg/m2 (Treatment Plan Actual) Intravenous Once Chauncey Cruel, MD      . heparin lock flush 100 unit/mL  500 Units Intracatheter Once PRN Chauncey Cruel, MD      . sodium chloride 0.9 % injection 10 mL  10 mL Intracatheter PRN Chauncey Cruel, MD        OBJECTIVE: Elderly white woman who appears stated age  82 Vitals:   02/17/15 1132 02/17/15 1148  BP: 130/34 126/48  Pulse: 71   Temp: 97.9 F (36.6 C)   Resp: 18      Body mass index is 28.89 kg/(m^2).    ECOG  FS: 2 Filed Weights   02/17/15 1132  Weight: 158 lb (71.668 kg)   Skin: warm, dry  HEENT: sclerae anicteric, conjunctivae pink, oropharynx clear. No thrush or mucositis.  Lymph Nodes: No cervical or supraclavicular lymphadenopathy  Lungs: clear to auscultation bilaterally, no rales, wheezes, or rhonci  Heart: regular rate and rhythm  Abdomen: round, soft, non tender, positive bowel sounds  Musculoskeletal: No focal spinal tenderness, no peripheral edema  Neuro: non focal, well oriented, positive affect  Breasts: deferred   LAB RESULTS: Lab Results  Component Value Date   WBC 5.7 02/17/2015   NEUTROABS 3.6 02/17/2015   HGB 9.7* 02/17/2015   HCT 30.6* 02/17/2015   MCV 92.2 02/17/2015   PLT 207 02/17/2015      Chemistry      Component Value Date/Time   NA 135* 02/17/2015 1037   NA 143 02/05/2015 0846   K 4.0 02/17/2015 1037   K 3.9 02/05/2015 0846   CL 100* 02/05/2015 0846   CL 104 07/08/2012 1023   CO2 23 02/17/2015 1037   CO2 25 02/05/2015 0846   BUN 47.4* 02/17/2015 1037   BUN 43* 02/05/2015 0846   CREATININE 1.3* 02/17/2015 1037  CREATININE 0.97 02/05/2015 0846   CREATININE 1.37* 12/21/2014 0810      Component Value Date/Time   CALCIUM 8.7 02/17/2015 1037   CALCIUM 8.9 02/05/2015 0846   ALKPHOS 152* 02/17/2015 1037   ALKPHOS 124 02/05/2015 0846   AST 27 02/17/2015 1037   AST 28 02/05/2015 0846   ALT 12 02/17/2015 1037   ALT 13* 02/05/2015 0846   BILITOT 0.56 02/17/2015 1037   BILITOT 1.2 02/05/2015 0846      STUDIES: Mr Abdomen W Wo Contrast  02/04/2015  CLINICAL DATA:  75 year old female with history of breast cancer with bone metastasis. New liver lesions. EXAM: MRI ABDOMEN WITHOUT AND WITH CONTRAST TECHNIQUE: Multiplanar multisequence MR imaging of the abdomen was performed both before and after the administration of intravenous contrast. CONTRAST:  52m MULTIHANCE GADOBENATE DIMEGLUMINE 529 MG/ML IV SOLN COMPARISON:  None. FINDINGS: Lower chest: Again  noted is a plaque-like area of pleural thickening in the lateral aspect of the lower right hemithorax measuring approximately 2.2 x 0.8 cm (image 15 of series 1201). Hepatobiliary: There are numerous (greater than 20) hepatic lesions on today's study which are T1 hypointense, mildly T2 hyperintense, restrict diffusion, and demonstrates progressive central enhancement on post gadolinium images, compatible with multiple new metastatic lesions. These are best demonstrated on precontrast T1 weighted images, with the largest of these lesions measures up to 1.3 x 1.7 cm (image 57 of series 1200) in segment 3 of the liver anteriorly. No intra or extrahepatic biliary ductal dilatation. Numerous tiny filling defects in the gallbladder are compatible with small gallstones. No current findings to suggest an acute cholecystitis at this time. Pancreas: Diffuse fatty atrophy in the pancreas. No pancreatic mass. No pancreatic ductal dilatation. No pancreatic or peripancreatic fluid or inflammatory changes. Spleen: Unremarkable. Adrenals/Urinary Tract: Several sub cm lesions are noted in the kidneys bilaterally, T1 hypointense, T2 hyperintense and nonenhancing, compatible with tiny cysts. No hydroureteronephrosis in the visualized abdomen. Bilateral adrenal glands are normal in appearance. Stomach/Bowel: Visualized portions are unremarkable. Vascular/Lymphatic: No aneurysm identified in the visualized abdominal vasculature. Extensive atherosclerotic disease throughout the abdominal aorta. No lymphadenopathy noted in the abdomen. Other: No significant volume of ascites noted in the visualized peritoneal cavity. Musculoskeletal: No definite osseous lesions identified in the visualized portions of the skeleton on today's examination. IMPRESSION: 1. Numerous new hepatic lesions throughout all aspects of the liver measuring up to 1.3 x 1.7 cm in segment 3 of the liver, compatible with widespread hepatic metastases. 2. Plaque-like area  of enhancing pleural thickening in the inferior aspect of the right hemithorax again noted. This is nonspecific, but pleural metastasis is not excluded, and continued attention on followup studies is recommended. 3. Cholelithiasis without evidence of acute cholecystitis at this time. 4. Additional incidental findings, as above. Electronically Signed   By: DVinnie LangtonM.D.   On: 02/04/2015 08:18   [place]  02/05/2015  CLINICAL DATA:  History of breast carcinoma metastatic to bone. Imaging has revealed multiple new liver lesions and the patient presents for liver biopsy. EXAM: ULTRASOUND GUIDED CORE BIOPSY OF LIVER MEDICATIONS: 1.0 mg IV Versed; 50 mcg IV Fentanyl Total Moderate Sedation Time: 14 minutes. PROCEDURE: The procedure, risks, benefits, and alternatives were explained to the patient. Questions regarding the procedure were encouraged and answered. The patient understands and consents to the procedure. The epigastric region was prepped with Betadine in a sterile fashion, and a sterile drape was applied covering the operative field. A sterile gown and sterile gloves were used for  the procedure. Local anesthesia was provided with 1% Lidocaine. Ultrasound was used to localize liver lesions. Under direct ultrasound guidance, a 17 gauge needle was advanced into the left lobe of the liver. Three separate core biopsy samples were obtained and submitted in formalin. The outer needle was removed and additional ultrasound performed. COMPLICATIONS: None. FINDINGS: Multiple small lesions are identified throughout the liver which are predominantly mildly hypoechoic relative to adjacent liver parenchyma. A lesion in the lateral segment of the left lobe of the liver was chosen for sampling and measures approximately 1.2 x 1.7 cm. Solid tissue was obtained. IMPRESSION: Ultrasound-guided core biopsy performed of one of several lesions in the liver. A lesion measuring 1.7 cm in greatest diameter within the left lobe  of the liver was sampled. Breast prognostic studies were requested. Electronically Signed   By: Aletta Edouard M.D.   On: 02/05/2015 13:39    ASSESSMENT: 75 y.o. [place] woman:   (1) Status post right breast biopsy in 05/2009 for a grade 2 invasive ductal carcinoma, T2 NX M1, Stage IV, strongly estrogen and progesterone receptor-positive, HER-2-negative with an MIB-1 of 26%.   (2) Neoadjuvantly she received Letrozole and zoledronic acid beginning in June of 2011 and underwent right modified radical mastectomy February of 2012 for a ypT2 ypN2, grade 1 invasive ductal carcinoma with negative margins.   (3) She completed radiation therapy in August of 2012.   (4) She has continued on Letrozole but was switched from Zoledronic acid to Denosumab Delton See) because of concerns regarding her serum creatinine. Delton See was being given every 8 weeks.  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  (6) symptomatic anemia, with creatinine clearance < 60 cc/min; Darbepoietin Q14d started 11/03/2011; received Feraheme 01/05/2012; ferritin August 2016 was greater than 800  (7) On subcutaneous B-12 supplementation monthly.  (8) Pain from osseous metastasis, osteoarthritis, tendinopathy, and bursitis as confirmed by recent MRI.  (9) Anemia, multifactorial with renal disease, on Aranesp monthly, increased to every 2 weeks June 2016  (10)  letrozole was discontinued in September 2014 with evidence of disease progression. She was started on fulvestrant injections, first given on 10/17/2012; stopped 06/03/2014 with progression  (11) exemestane and everolimus started 07/08/2014  (a) everolimus discontinued August 2016 with pneumonitis  (b) exemestane held at the start of everolimus January 2017  (12) liver biopsy 02/05/2015 confirms metastatic breast cancer to the liver, estrogen and  progesterone receptor positive, HER-2 negative  (13) eribulin starting 02/17/2015, to be given days 1 and 8 of each 21 day cycle   PLAN: Diera and I spent time reviewing potential side effects and toxicities of eribulin. We discussed the use of zofran BID and compazine q6hrs PRN for her nausea. The labs were reviewed in detail and were stable. She will proceed with day 1, cycle 1 of eribulin as planned today.   I suggested she follow up with Dr. Martinique regarding her CHF, low blood pressures, and coreg use. She is asymptomatic today.   Leeloo will return in 1 week for day 8 of treatment. I have added appointments to get her through her 3rd cycle. After this, restaging scans will be performed. She understands and agrees with this plan. She knows the goal of treatment in her case is control. She has been encouraged to call with any issues that might arise before her next visit here.   Laurie Panda, NP 02/17/2015  12:32 PM

## 2015-02-17 NOTE — Telephone Encounter (Signed)
Appointments made will receive avs in treatment room

## 2015-02-18 ENCOUNTER — Encounter: Payer: Self-pay | Admitting: Family Medicine

## 2015-02-18 LAB — CANCER ANTIGEN 27-29 (PARALLEL TESTING): CA 27.29: 194 U/mL — ABNORMAL HIGH (ref 0–39)

## 2015-02-18 LAB — CANCER ANTIGEN 27.29: CA 27.29: 212.7 U/mL — ABNORMAL HIGH (ref 0.0–38.6)

## 2015-02-23 ENCOUNTER — Ambulatory Visit (INDEPENDENT_AMBULATORY_CARE_PROVIDER_SITE_OTHER): Payer: Medicare Other | Admitting: Family Medicine

## 2015-02-23 ENCOUNTER — Encounter: Payer: Self-pay | Admitting: Family Medicine

## 2015-02-23 VITALS — BP 100/60 | HR 72 | Temp 98.1°F | Resp 16 | Ht 62.0 in | Wt 155.5 lb

## 2015-02-23 DIAGNOSIS — I951 Orthostatic hypotension: Secondary | ICD-10-CM | POA: Diagnosis not present

## 2015-02-23 DIAGNOSIS — R42 Dizziness and giddiness: Secondary | ICD-10-CM | POA: Diagnosis not present

## 2015-02-23 NOTE — Progress Notes (Signed)
Pre visit review using our clinic review tool, if applicable. No additional management support is needed unless otherwise documented below in the visit note. 

## 2015-02-23 NOTE — Patient Instructions (Signed)
Take 1/2 of your 25mg  Coreg tab twice a day. Stop lasix until further notice.

## 2015-02-23 NOTE — Progress Notes (Signed)
OFFICE VISIT  02/23/2015   CC:  Chief Complaint  Patient presents with  . Dizziness    x 2 weeks   HPI:    Patient is a 75 y.o. Caucasian female who presents for dizziness. 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 She had nausea w/dry heaves for a day or two after treatment but this cleared up relatively well recently.  Appetite/PO intake has been subnormal.    Has had problem with feeling orthostatic dizziness for approx 2 wks.  She has to sit down briefly, sometimes needs to rest 10 min before feeling like she can try to get up and walk again.    Has no dizziness that starts while sitting--ONLY with going from sitting to standing. She has had some documented hypotension about 10d, ago, had to get a bag of IVF.   Has been taken off some antihypertensives but still takes lasix 40mg  bid and coreg 25mg  bid.  ROS: no CP, no SOB.  No signif LE swelling.  She has a HA lately, mostly L parieto-occipital region radiating into L sided neck and shoulder mm's.  No dysuria, hematuria, urinary urgency, or frequency.  No abd pain or diarrhea.   Past Medical History  Diagnosis Date  . 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 Anxiety and depression   . GERD (gastroesophageal reflux disease)   . Hyperlipidemia   . Hypothyroidism   . Osteoarthritis   . Carotid artery stenosis     bilateral  . CAD (coronary artery disease)     medical therapy, old tot RCA, patent LAD, mod. severe ostial LCX stenosis--PCI would be too complex-med mgmt recommended.  . OSA (obstructive sleep apnea)   . Hypertension   . Carotid artery occlusion   . Unspecified constipation 03/15/2012  . Bursitis   . Collagen vascular disease (HCC)   . History of radiation therapy 10/31/12-11/13/12    rt&lt humerous 30Gy/15fx  . Valvular heart disease 07/11/2013  . Diarrhea 09/28/2013  . Chronic renal insufficiency, stage II (mild)    Borderline stage II/III, CrCl 50s/60s.  . History of blood transfusion   . Urinary tract bacterial infections   . Breast cancer (HCC) 05/2009    Grade 2 invasive ductal carcinoma, T2 NXM1, stage IV at presentation. There was bone only involvement. New liver lesions 12/2014 (path confirmed mets): pt to start chemo (eribucin) treatments to start on February 17, 2015. She will receive treatment on the first and eighth day of each 21 day cycle. The plan is to go to 3 or 4 cycles before restaging  . Diabetes mellitus type II     insulin  . Hypocalcemia 07/2014    required IV calcium in ED once and was admitted to hosp once.  ? secondary to [facility] and everolimus?  . Drug-induced pneumonitis 08/2014    (Everolimus) CXR 09/21/14 improved; needs another CXR 2-3 wks later to make sure resolution occurs.    Past Surgical History  Procedure Laterality Date  . Incisional breast biopsy      remoted left breast biopsy  . Cesarean section    . Other surgical history      GYN surgery  . Knee surgery      right knee surgery  . Carotid endarterectomy  2011    right carotid  . Port-a-cath removal    . Modified radical mastectomy  Feb 2012  Right breast  . Carotid stent      left  . Cardiac catheterization  06/2013    old total occ. of RCA, moderately severe ostial LCX stenosis- medical therapy- would be complex PCI  . Carotid angiogram N/A 03/01/2011    Procedure: CAROTID ANGIOGRAM;  Surgeon: Conrad Salley, MD;  Location: [facility] CATH LAB;  Service: Cardiovascular;  Laterality: N/A;  . Arch aortogram  03/01/2011    Procedure: ARCH AORTOGRAM;  Surgeon: Conrad Burneyville, MD;  Location: [facility] CATH LAB;  Service: Cardiovascular;;  . Carotid stent insertion Left 03/15/2011    Procedure: CAROTID STENT INSERTION;  Surgeon: Serafina Mitchell, MD;  Location: [facility] CATH LAB;  Service: Cardiovascular;  Laterality: Left;  . Left heart catheterization with coronary angiogram N/A 07/07/2013    Procedure: LEFT HEART CATHETERIZATION WITH  CORONARY ANGIOGRAM;  Surgeon: Blane Ohara, MD;  Location: [facility] CATH LAB;  Service: Cardiovascular;  Laterality: N/A;  . Carotid dopplers  05/2014    Patent ICAs, bilat ECA dz: no change compared to 2014  . Transthoracic echocardiogram  06/2013    EF 55-60%, grd I diast dysfxn, mod mitral regurg    Outpatient Prescriptions Prior to Visit  Medication Sig Dispense Refill  . ACCU-CHEK AVIVA PLUS test strip CHECK BLOOD SUGAR TWICE A DAY FOR DX:250.00 100 each 11  . aspirin 81 MG chewable tablet Chew 81 mg by mouth daily.    Marland Kitchen atorvastatin (LIPITOR) 80 MG tablet Take 1 tablet (80 mg total) by mouth daily. 30 tablet 11  . Blood Glucose Monitoring Suppl (ACCU-CHEK NANO SMARTVIEW) W/DEVICE KIT Use to check blood sugar twice a day. DX 250.00 1 kit 0  . calcium carbonate (TUMS) 500 MG chewable tablet Chew 1 tablet (200 mg of elemental calcium total) by mouth 3 (three) times daily with meals. 30 tablet 0  . carvedilol (COREG) 25 MG tablet Take 1 tablet (25 mg total) by mouth 2 (two) times daily with a meal. 60 tablet 8  . clopidogrel (PLAVIX) 75 MG tablet Take 1 tablet (75 mg total) by mouth daily. 30 tablet 8  . cyanocobalamin (,VITAMIN B-12,) 1000 MCG/ML injection Inject 1,000 mcg into the muscle every 30 (thirty) days.     . darbepoetin (ARANESP) 200 MCG/0.4ML SOLN Inject 300 mcg into the skin every 14 (fourteen) days.    . furosemide (LASIX) 40 MG tablet Take 1 tablet (40 mg total) by mouth 2 (two) times daily. 1 tab po bid 60 tablet 6  . gabapentin (NEURONTIN) 100 MG capsule Take 1 capsule (100 mg total) by mouth 3 (three) times daily. 90 capsule 2  . glycerin adult (GLYCERIN ADULT) 2 G SUPP Place 1 suppository rectally once as needed (constipation). 10 suppository 0  . insulin detemir (LEVEMIR) 100 UNIT/ML injection Inject 0.15 mLs (15 Units total) into the skin at bedtime. 10 mL 0  . insulin lispro (HUMALOG KWIKPEN) 100 UNIT/ML KiwkPen 2 U SQ qAM, 3 U SQ qLUNCH, and 3 U SQ qSupper 15 mL 11  .  levothyroxine (SYNTHROID, LEVOTHROID) 150 MCG tablet 1 tab po qd except take 1 and 1/2 tabs on Mondays and Fridays 34 tablet 1  . lidocaine-prilocaine (EMLA) cream Apply to affected area once 30 g 3  . mirtazapine (REMERON) 15 MG tablet Take 1 tablet (15 mg total) by mouth at bedtime. 30 tablet 3  . morphine (MS CONTIN) 30 MG 12 hr tablet Take 1 tablet (30 mg total) by mouth 3 (three) times daily. 90 tablet 0  .  morphine (MSIR) 15 MG tablet Take 1 tablet (15 mg total) by mouth every 4 (four) hours as needed for severe pain. 60 tablet 0  . nitroGLYCERIN (NITROSTAT) 0.4 MG SL tablet Place 1 tablet (0.4 mg total) under the tongue every 5 (five) minutes as needed for chest pain. 30 tablet 0  . omeprazole (PRILOSEC) 40 MG capsule Take 1 capsule (40 mg total) by mouth daily. 30 capsule 3  . polyethylene glycol (MIRALAX / GLYCOLAX) packet Take 17 g by mouth daily as needed for mild constipation. Reported on 02/17/2015    . potassium chloride SA (K-DUR,KLOR-CON) 20 MEQ tablet Take 2 tablets (40 mEq total) by mouth daily. 60 tablet 3  . senna-docusate (SENOKOT-S) 8.6-50 MG per tablet Take 1 tablet by mouth at bedtime.     Marland Kitchen LORazepam (ATIVAN) 0.5 MG tablet Take one as needed for anxiety and a second tablet prn. (Patient not taking: Reported on 02/23/2015) 2 tablet 0  . ondansetron (ZOFRAN) 8 MG tablet Take 1 tablet (8 mg total) by mouth 2 (two) times daily. Start the day after chemo for 2 days. Then take as needed for nausea or vomiting. (Patient not taking: Reported on 02/23/2015) 30 tablet 1  . prochlorperazine (COMPAZINE) 10 MG tablet Take 1 tablet (10 mg total) by mouth every 6 (six) hours as needed (Nausea or vomiting). (Patient not taking: Reported on 02/23/2015) 30 tablet 1   No facility-administered medications prior to visit.    Allergies  Allergen Reactions  . Chlorhexidine Hives, Itching and Rash    This was most likely a CONTACT DERMATITIS versus true systemic allergic reaction  . Adhesive [Tape]  Hives  . Codeine Swelling    ROS As per HPI  PE: Blood pressure 100/60, pulse 72, temperature 98.1 F (36.7 C), temperature source Oral, resp. rate 16, height '5\' 2"'$  (1.575 m), weight 155 lb 8 oz (70.534 kg), last menstrual period 03/13/2012, SpO2 97 %. Orthostatics: supine 120/60, P 92.  Sitting upright 98/56, P94.  Standing bp 80/40, P75 Gen: Alert, tired but not ill-appearing.  Patient is oriented to person, place, time, and situation. ZOX:WRUE: no injection, icteris, swelling, or exudate.  EOMI, PERRLA. Mouth: lips without lesion/swelling.  Oral mucosa pink and moist. Oropharynx without erythema, exudate, or swelling.  CV: RRR, no m/r/g.   LUNGS: CTA bilat, nonlabored resps, good aeration in all lung fields. EXT: trace bilat LE pitting edema Neuro: CN 2-12 intact bilaterally, strength 5/5 in proximal and distal upper extremities and lower extremities bilaterally.    LABS:    Chemistry      Component Value Date/Time   NA 135* 02/17/2015 1037   NA 143 02/05/2015 0846   K 4.0 02/17/2015 1037   K 3.9 02/05/2015 0846   CL 100* 02/05/2015 0846   CL 104 07/08/2012 1023   CO2 23 02/17/2015 1037   CO2 25 02/05/2015 0846   BUN 47.4* 02/17/2015 1037   BUN 43* 02/05/2015 0846   CREATININE 1.3* 02/17/2015 1037   CREATININE 0.97 02/05/2015 0846   CREATININE 1.37* 12/21/2014 0810      Component Value Date/Time   CALCIUM 8.7 02/17/2015 1037   CALCIUM 8.9 02/05/2015 0846   ALKPHOS 152* 02/17/2015 1037   ALKPHOS 124 02/05/2015 0846   AST 27 02/17/2015 1037   AST 28 02/05/2015 0846   ALT 12 02/17/2015 1037   ALT 13* 02/05/2015 0846   BILITOT 0.56 02/17/2015 1037   BILITOT 1.2 02/05/2015 0846     IMPRESSION AND PLAN:  Orthostatic  hypotension--dizziness.  Suspect this is from relative dehydration related to suboptimal intake of solids and liquids over the last 2 wks, plus ongoing diuresis with lasix.  Decrease coreg to 1/2 of '25mg'$  tab bid. Stop lasix for now. Recheck pt in  office in 1 wk and check BMET at that time.  Spent 25 min with pt today, with >50% of this time spent in counseling and care coordination regarding the above problems.  An After Visit Summary was printed and given to the patient.  FOLLOW UP: Return for f/u orthostatic hypotension.

## 2015-02-24 ENCOUNTER — Other Ambulatory Visit: Payer: Self-pay

## 2015-02-24 ENCOUNTER — Ambulatory Visit (HOSPITAL_BASED_OUTPATIENT_CLINIC_OR_DEPARTMENT_OTHER): Payer: Medicare Other | Admitting: Nurse Practitioner

## 2015-02-24 ENCOUNTER — Other Ambulatory Visit (HOSPITAL_BASED_OUTPATIENT_CLINIC_OR_DEPARTMENT_OTHER): Payer: Medicare Other

## 2015-02-24 ENCOUNTER — Encounter: Payer: Self-pay | Admitting: Nurse Practitioner

## 2015-02-24 ENCOUNTER — Ambulatory Visit (HOSPITAL_BASED_OUTPATIENT_CLINIC_OR_DEPARTMENT_OTHER): Payer: Medicare Other

## 2015-02-24 ENCOUNTER — Other Ambulatory Visit: Payer: Self-pay | Admitting: Oncology

## 2015-02-24 ENCOUNTER — Ambulatory Visit: Payer: Self-pay | Admitting: Nurse Practitioner

## 2015-02-24 ENCOUNTER — Ambulatory Visit: Payer: Medicare Other

## 2015-02-24 VITALS — BP 118/42 | HR 86 | Temp 98.2°F | Resp 18 | Ht 62.0 in | Wt 158.8 lb

## 2015-02-24 DIAGNOSIS — C7951 Secondary malignant neoplasm of bone: Secondary | ICD-10-CM

## 2015-02-24 DIAGNOSIS — C787 Secondary malignant neoplasm of liver and intrahepatic bile duct: Secondary | ICD-10-CM

## 2015-02-24 DIAGNOSIS — R51 Headache: Secondary | ICD-10-CM

## 2015-02-24 DIAGNOSIS — D631 Anemia in chronic kidney disease: Secondary | ICD-10-CM

## 2015-02-24 DIAGNOSIS — E860 Dehydration: Secondary | ICD-10-CM

## 2015-02-24 DIAGNOSIS — N189 Chronic kidney disease, unspecified: Secondary | ICD-10-CM

## 2015-02-24 DIAGNOSIS — Z170 Estrogen receptor positive status [ER+]: Secondary | ICD-10-CM

## 2015-02-24 DIAGNOSIS — Z5111 Encounter for antineoplastic chemotherapy: Secondary | ICD-10-CM

## 2015-02-24 DIAGNOSIS — Z95828 Presence of other vascular implants and grafts: Secondary | ICD-10-CM

## 2015-02-24 DIAGNOSIS — D701 Agranulocytosis secondary to cancer chemotherapy: Secondary | ICD-10-CM

## 2015-02-24 DIAGNOSIS — C50811 Malignant neoplasm of overlapping sites of right female breast: Secondary | ICD-10-CM

## 2015-02-24 DIAGNOSIS — Z5189 Encounter for other specified aftercare: Secondary | ICD-10-CM | POA: Diagnosis not present

## 2015-02-24 DIAGNOSIS — C50911 Malignant neoplasm of unspecified site of right female breast: Secondary | ICD-10-CM

## 2015-02-24 DIAGNOSIS — I952 Hypotension due to drugs: Secondary | ICD-10-CM

## 2015-02-24 DIAGNOSIS — T451X5A Adverse effect of antineoplastic and immunosuppressive drugs, initial encounter: Secondary | ICD-10-CM

## 2015-02-24 DIAGNOSIS — R519 Headache, unspecified: Secondary | ICD-10-CM

## 2015-02-24 LAB — CBC WITH DIFFERENTIAL/PLATELET
BASO%: 2.1 % — ABNORMAL HIGH (ref 0.0–2.0)
BASOS ABS: 0.1 10*3/uL (ref 0.0–0.1)
EOS ABS: 0 10*3/uL (ref 0.0–0.5)
EOS%: 1.7 % (ref 0.0–7.0)
HEMATOCRIT: 30.2 % — AB (ref 34.8–46.6)
HEMOGLOBIN: 9.6 g/dL — AB (ref 11.6–15.9)
LYMPH#: 1.2 10*3/uL (ref 0.9–3.3)
LYMPH%: 48.7 % (ref 14.0–49.7)
MCH: 29.2 pg (ref 25.1–34.0)
MCHC: 31.8 g/dL (ref 31.5–36.0)
MCV: 91.8 fL (ref 79.5–101.0)
MONO#: 0.2 10*3/uL (ref 0.1–0.9)
MONO%: 9.2 % (ref 0.0–14.0)
NEUT#: 0.9 10*3/uL — ABNORMAL LOW (ref 1.5–6.5)
NEUT%: 38.3 % — ABNORMAL LOW (ref 38.4–76.8)
PLATELETS: 208 10*3/uL (ref 145–400)
RBC: 3.29 10*6/uL — ABNORMAL LOW (ref 3.70–5.45)
RDW: 15.4 % — AB (ref 11.2–14.5)
WBC: 2.4 10*3/uL — ABNORMAL LOW (ref 3.9–10.3)

## 2015-02-24 LAB — COMPREHENSIVE METABOLIC PANEL
ALBUMIN: 3.1 g/dL — AB (ref 3.5–5.0)
ALK PHOS: 145 U/L (ref 40–150)
ALT: 10 U/L (ref 0–55)
AST: 26 U/L (ref 5–34)
Anion Gap: 17 mEq/L — ABNORMAL HIGH (ref 3–11)
BILIRUBIN TOTAL: 0.72 mg/dL (ref 0.20–1.20)
BUN: 63.1 mg/dL — ABNORMAL HIGH (ref 7.0–26.0)
CALCIUM: 9.1 mg/dL (ref 8.4–10.4)
CO2: 25 mEq/L (ref 22–29)
Chloride: 91 mEq/L — ABNORMAL LOW (ref 98–109)
Creatinine: 1.7 mg/dL — ABNORMAL HIGH (ref 0.6–1.1)
EGFR: 30 mL/min/{1.73_m2} — AB (ref >90)
Glucose: 204 mg/dl — ABNORMAL HIGH (ref 70–140)
POTASSIUM: 3.9 meq/L (ref 3.5–5.1)
Sodium: 132 mEq/L — ABNORMAL LOW (ref 136–145)
TOTAL PROTEIN: 6.8 g/dL (ref 6.4–8.3)

## 2015-02-24 MED ORDER — SODIUM CHLORIDE 0.9 % IV SOLN
10.0000 mg | Freq: Once | INTRAVENOUS | Status: AC
  Administered 2015-02-24: 10 mg via INTRAVENOUS
  Filled 2015-02-24: qty 1

## 2015-02-24 MED ORDER — HEPARIN SOD (PORK) LOCK FLUSH 100 UNIT/ML IV SOLN
500.0000 [IU] | Freq: Once | INTRAVENOUS | Status: AC | PRN
  Administered 2015-02-24: 500 [IU]
  Filled 2015-02-24: qty 5

## 2015-02-24 MED ORDER — SODIUM CHLORIDE 0.9 % IV SOLN
1.1400 mg/m2 | Freq: Once | INTRAVENOUS | Status: AC
  Administered 2015-02-24: 2 mg via INTRAVENOUS
  Filled 2015-02-24: qty 4

## 2015-02-24 MED ORDER — SODIUM CHLORIDE 0.9 % IV SOLN
Freq: Once | INTRAVENOUS | Status: AC
  Administered 2015-02-24: 13:00:00 via INTRAVENOUS

## 2015-02-24 MED ORDER — SODIUM CHLORIDE 0.9% FLUSH
10.0000 mL | INTRAVENOUS | Status: DC | PRN
Start: 2015-02-24 — End: 2015-02-24
  Administered 2015-02-24: 10 mL via INTRAVENOUS
  Filled 2015-02-24: qty 10

## 2015-02-24 MED ORDER — IBUPROFEN 200 MG PO TABS
ORAL_TABLET | ORAL | Status: AC
  Filled 2015-02-24: qty 2

## 2015-02-24 MED ORDER — HEPARIN SOD (PORK) LOCK FLUSH 100 UNIT/ML IV SOLN
250.0000 [IU] | Freq: Once | INTRAVENOUS | Status: DC | PRN
  Filled 2015-02-24: qty 5

## 2015-02-24 MED ORDER — PALONOSETRON HCL INJECTION 0.25 MG/5ML
INTRAVENOUS | Status: AC
  Filled 2015-02-24: qty 5

## 2015-02-24 MED ORDER — PEGFILGRASTIM 6 MG/0.6ML ~~LOC~~ PSKT
6.0000 mg | PREFILLED_SYRINGE | Freq: Once | SUBCUTANEOUS | Status: AC
  Administered 2015-02-24: 6 mg via SUBCUTANEOUS
  Filled 2015-02-24: qty 0.6

## 2015-02-24 MED ORDER — SODIUM CHLORIDE 0.9 % IJ SOLN
10.0000 mL | INTRAMUSCULAR | Status: DC | PRN
  Administered 2015-02-24: 10 mL
  Filled 2015-02-24: qty 10

## 2015-02-24 MED ORDER — IBUPROFEN 200 MG PO TABS
400.0000 mg | ORAL_TABLET | Freq: Once | ORAL | Status: AC
  Administered 2015-02-24: 400 mg via ORAL

## 2015-02-24 MED ORDER — PALONOSETRON HCL INJECTION 0.25 MG/5ML
0.2500 mg | Freq: Once | INTRAVENOUS | Status: AC
  Administered 2015-02-24: 0.25 mg via INTRAVENOUS

## 2015-02-24 NOTE — Progress Notes (Signed)
Patient ID: Brittney Cunningham, female   DOB: May 21, 1940, 75 y.o.   MRN: [id number]  [place]  [phone] [phone]  OFFICE PROGRESS NOTE   ID: Brittney Cunningham   DOB: 18-Jan-1941  MR#: [id number]  LGX#:[id number]   PCP: [name], MD SU: Rolm Bookbinder OTHER MD: Teena Dunk, Kyung Rudd, Kevin Supple, Peter Martinique  CHIEF COMPLAINT:  Metastatic Breast Cancer  CURRENT TREATMENT: aranesp bimonthly, B-12 monthly, denosumab every 12 weeks, eribulin  BREAST CANCER HISTORY:  from the earlier summary:  Brittney Cunningham was diagnosed with right breast carcinoma in May of 2011, a biopsy showing a grade 2 invasive ductal carcinoma, T2 NXM1, stage IV at presentation. There was bone only involvement. Tumor was strongly ER and PR positive, HER-2/neu negative, with an MIP-1 of 26%.  She was treated neoadjuvantly with letrozole and zoledronic acid beginning in June of 2011. She is status post right modified radical mastectomy in February 2012 for a ypT2 ypN2, grade 1 invasive ductal carcinoma. Margins were negative.   Her subsequent history is as detailed below  INTERVAL HISTORY: Brittney Cunningham returns today for follow up of her metastatic breast cancer, accompanied by her brother. Today is day 8, cycle 1 of eribulin, which is given on days 1 and 8 of every 21 day cycle. Brittney Cunningham visited her PCP who d/c'd her lasix, and dropped her coreg to 12.'5mg'$  BID. Her blood pressure manually today was 118/42. She has been feeling dizzy this week. She is also having occasional headaches.  REVIEW OF SYSTEMS: Brittney Cunningham denies fevers or chills. She had waves of nausea last Friday and Saturday, with some dry heaves. She never vomited. She is taking the zofran and compazine as prescribed. Her appetite is fair. She denies mouth sores, rashes, or neuropathy symptoms beyond the very mild numbness to her toes which was present at baseline. Her pain is well managed on MS contin with MSIR for breakthrough pain.  This does not constipate her. She denies shortness of breath, chest pain, cough, or palpitations. A detailed review of systems is otherwise stable.   PAST MEDICAL HISTORY: Past Medical History  Diagnosis Date  . 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 Anxiety and depression   . GERD (gastroesophageal reflux disease)   . Hyperlipidemia   . Hypothyroidism   . Osteoarthritis   . Carotid artery stenosis     bilateral  . CAD (coronary artery disease)     medical therapy, old tot RCA, patent LAD, mod. severe ostial LCX stenosis--PCI would be too complex-med mgmt recommended.  . OSA (obstructive sleep apnea)   . Hypertension   . Carotid artery occlusion   . Unspecified constipation 03/15/2012  . Bursitis   . Collagen vascular disease ([place])   . History of radiation therapy 10/31/12-11/13/12    rt&lt humerous 30Gy/76f  . Valvular heart disease 07/11/2013  . Diarrhea 09/28/2013  . Chronic renal insufficiency, stage II (mild)     Borderline stage II/III, CrCl 50s/60s.  . History of blood transfusion   . Urinary tract bacterial infections   . [place]    Grade 2 invasive ductal carcinoma, T2 NXM1, stage IV at presentation. There was bone only involvement. New liver lesions 12/2014 (path confirmed mets): pt to start chemo (eribucin) treatments to start on February 17, 2015. She will receive treatment on the first and eighth day of each 21 day cycle. The plan is to go to 3 or 4  cycles before restaging  . Diabetes mellitus type II     insulin  . Hypocalcemia 07/2014    required IV calcium in ED once and was admitted to hosp once.  ? secondary to [facility] and everolimus?  . Drug-induced pneumonitis 08/2014    (Everolimus) CXR 09/21/14 improved; needs another CXR 2-3 wks later to make sure resolution occurs.    PAST SURGICAL HISTORY: Past Surgical History  Procedure Laterality Date  . Incisional breast biopsy      remoted left breast biopsy  . Cesarean  section    . Other surgical history      GYN surgery  . Knee surgery      right knee surgery  . Carotid endarterectomy  2011    right carotid  . Port-a-cath removal    . Modified radical mastectomy  Feb 2012    Right breast  . Carotid stent      left  . Cardiac catheterization  06/2013    old total occ. of RCA, moderately severe ostial LCX stenosis- medical therapy- would be complex PCI  . Carotid angiogram N/A 03/01/2011    Procedure: CAROTID ANGIOGRAM;  Surgeon: Fransisco Hertz, MD;  Location: [facility] CATH LAB;  Service: Cardiovascular;  Laterality: N/A;  . Arch aortogram  03/01/2011    Procedure: ARCH AORTOGRAM;  Surgeon: Fransisco Hertz, MD;  Location: [facility] CATH LAB;  Service: Cardiovascular;;  . Carotid stent insertion Left 03/15/2011    Procedure: CAROTID STENT INSERTION;  Surgeon: Nada Libman, MD;  Location: [facility] CATH LAB;  Service: Cardiovascular;  Laterality: Left;  . Left heart catheterization with coronary angiogram N/A 07/07/2013    Procedure: LEFT HEART CATHETERIZATION WITH CORONARY ANGIOGRAM;  Surgeon: Micheline Chapman, MD;  Location: [facility] CATH LAB;  Service: Cardiovascular;  Laterality: N/A;  . Carotid dopplers  05/2014    Patent ICAs, bilat ECA dz: no change compared to 2014  . Transthoracic echocardiogram  06/2013    EF 55-60%, grd I diast dysfxn, mod mitral regurg    FAMILY HISTORY Family History  Problem Relation Age of Onset  . Arthritis    . Coronary artery disease      first degree relative  . Stroke      first degree relative  . Diabetes Mother   . Heart disease Mother   . Hypertension Mother   . Cancer Mother   . Deep vein thrombosis Mother   . Colon cancer Neg Hx   . Stomach cancer Neg Hx   . Cancer Father     Pancreatic cancer  . Breast cancer Paternal Aunt 25  The patient's father died from pancreatic cancer at the age of 36. The patient's mother died from complications of diabetes and heart disease at the age of 79. The patient has 2 sisters and 2 brothers. There  is no history of breast cancer or ovarian cancer in the immediate family. One of the patient's paternal aunts out of 6 paternal aunts had breast cancer diagnosed in her 57s.   GYNECOLOGIC HISTORY: The patient is GX, P3. First pregnancy to term age 42. She went through the change of life in her late 72s. She never took hormones.   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 Her husband, [name], is disabled secondary to a fall.  He has difficulty with walking. He used to work for [employer] previously. The patient's son, [name], lives in [place], and works for [employer]. Daughter, [name], lives in [place], and works for [employer]. Daughter, [name], lives in [place], [place], and works as a Geologist, engineering. The patient attends [employer].  ADVANCED DIRECTIVES: Not on file; During her hospitalization the patient agreed to a DO NOT RESUSCITATE order. However that is now not current. Today (09/21/2014) I gave her the appropriate forms to consider and notarize at her discretion  HEALTH MAINTENANCE:  (Updated 04/23/2013) Social History  Substance Use Topics  . Smoking status: Former Smoker -- 1.00 packs/day for 20 years    Types: Cigarettes    Quit date: 01/31/1988  . Smokeless tobacco: Never Used  . Alcohol Use: No    Colonoscopy: Not on file  PAP: Not on file  Bone density: July 2011, Normal  Lipid panel: 06/26/2012  Allergies  Allergen Reactions  . Chlorhexidine Hives, Itching and Rash    This was most likely a CONTACT DERMATITIS versus true systemic allergic reaction  . Adhesive [Tape] Hives  . Codeine Swelling    Current Outpatient Prescriptions  Medication Sig Dispense Refill  . ACCU-CHEK AVIVA PLUS test strip CHECK BLOOD SUGAR TWICE A DAY FOR DX:250.00 100 each 11  . aspirin 81 MG chewable tablet Chew 81 mg by mouth daily.    Marland Kitchen atorvastatin (LIPITOR) 80 MG tablet Take 1 tablet (80 mg total)  by mouth daily. 30 tablet 11  . Blood Glucose Monitoring Suppl (ACCU-CHEK NANO SMARTVIEW) W/DEVICE KIT Use to check blood sugar twice a day. DX 250.00 1 kit 0  . calcium carbonate (TUMS) 500 MG chewable tablet Chew 1 tablet (200 mg of elemental calcium total) by mouth 3 (three) times daily with meals. 30 tablet 0  . carvedilol (COREG) 25 MG tablet Take 1 tablet (25 mg total) by mouth 2 (two) times daily with a meal. (Patient taking differently: Take 25 mg by mouth 2 (two) times daily with a meal. Pt taking 1/2 of carvediol tablet twice daily.) 60 tablet 8  . clopidogrel (PLAVIX) 75 MG tablet Take 1 tablet (75 mg total) by mouth daily. 30 tablet 8  . cyanocobalamin (,VITAMIN B-12,) 1000 MCG/ML injection Inject 1,000 mcg into the muscle every 30 (thirty) days.     . darbepoetin (ARANESP) 200 MCG/0.4ML SOLN Inject 300 mcg into the skin every 14 (fourteen) days.    Marland Kitchen gabapentin (NEURONTIN) 100 MG capsule Take 1 capsule (100 mg total) by mouth 3 (three) times daily. 90 capsule 2  . glycerin adult (GLYCERIN ADULT) 2 G SUPP Place 1 suppository rectally once as needed (constipation). 10 suppository 0  . insulin detemir (LEVEMIR) 100 UNIT/ML injection Inject 0.15 mLs (15 Units total) into the skin at bedtime. 10 mL 0  . insulin lispro (HUMALOG KWIKPEN) 100 UNIT/ML KiwkPen 2 U SQ qAM, 3 U SQ qLUNCH, and 3 U SQ qSupper 15 mL 11  . levothyroxine (SYNTHROID, LEVOTHROID) 150 MCG tablet 1 tab po qd except take 1 and 1/2 tabs on Mondays and Fridays 34 tablet 1  . lidocaine-prilocaine (EMLA) cream Apply to affected area once 30 g 3  . mirtazapine (REMERON) 15 MG tablet Take 1 tablet (15 mg total) by mouth at bedtime. 30 tablet 3  . morphine (MS CONTIN) 30 MG 12 hr tablet Take 1 tablet (30 mg total) by mouth 3 (three) times daily. 90 tablet 0  . morphine (MSIR) 15 MG tablet Take 1 tablet (15 mg total) by mouth every 4 (four) hours as needed for severe pain. 60 tablet 0  . nitroGLYCERIN (NITROSTAT) 0.4  MG SL tablet  Place 1 tablet (0.4 mg total) under the tongue every 5 (five) minutes as needed for chest pain. 30 tablet 0  . omeprazole (PRILOSEC) 40 MG capsule Take 1 capsule (40 mg total) by mouth daily. 30 capsule 3  . polyethylene glycol (MIRALAX / GLYCOLAX) packet Take 17 g by mouth daily as needed for mild constipation. Reported on 02/17/2015    . potassium chloride SA (K-DUR,KLOR-CON) 20 MEQ tablet Take 2 tablets (40 mEq total) by mouth daily. 60 tablet 3  . senna-docusate (SENOKOT-S) 8.6-50 MG per tablet Take 1 tablet by mouth at bedtime.     . furosemide (LASIX) 40 MG tablet Take 1 tablet (40 mg total) by mouth 2 (two) times daily. 1 tab po bid (Patient not taking: Reported on 02/24/2015) 60 tablet 6  . [DISCONTINUED] simvastatin (ZOCOR) 40 MG tablet Take 40 mg by mouth every evening.     Current Facility-Administered Medications  Medication Dose Route Frequency Provider Last Rate Last Dose  . ibuprofen (ADVIL,MOTRIN) tablet 400 mg  400 mg Oral Once Laurie Panda, NP        OBJECTIVE: Elderly white woman who appears stated age  24 Vitals:   02/24/15 1121 02/24/15 1131  BP: 114/25 118/42  Pulse: 86   Temp: 98.2 F (36.8 C)   Resp: 18      Body mass index is 29.04 kg/(m^2).    ECOG FS: 2 Filed Weights   02/24/15 1121  Weight: 158 lb 12.8 oz (72.031 kg)   Sclerae unicteric, pupils round and equal Oropharynx clear and moist-- no thrush or other lesions No cervical or supraclavicular adenopathy Lungs no rales or rhonchi Heart regular rate and rhythm Abd soft, nontender, positive bowel sounds MSK no focal spinal tenderness, no upper extremity lymphedema Neuro: nonfocal, well oriented, appropriate affect Breasts:deferred  LAB RESULTS: Lab Results  Component Value Date   WBC 2.4* 02/24/2015   NEUTROABS 0.9* 02/24/2015   HGB 9.6* 02/24/2015   HCT 30.2* 02/24/2015   MCV 91.8 02/24/2015   PLT 208 02/24/2015      Chemistry      Component Value Date/Time   NA 132* 02/24/2015 1031    NA 143 02/05/2015 0846   K 3.9 02/24/2015 1031   K 3.9 02/05/2015 0846   CL 100* 02/05/2015 0846   CL 104 07/08/2012 1023   CO2 25 02/24/2015 1031   CO2 25 02/05/2015 0846   BUN 63.1* 02/24/2015 1031   BUN 43* 02/05/2015 0846   CREATININE 1.7* 02/24/2015 1031   CREATININE 0.97 02/05/2015 0846   CREATININE 1.37* 12/21/2014 0810      Component Value Date/Time   CALCIUM 9.1 02/24/2015 1031   CALCIUM 8.9 02/05/2015 0846   ALKPHOS 145 02/24/2015 1031   ALKPHOS 124 02/05/2015 0846   AST 26 02/24/2015 1031   AST 28 02/05/2015 0846   ALT 10 02/24/2015 1031   ALT 13* 02/05/2015 0846   BILITOT 0.72 02/24/2015 1031   BILITOT 1.2 02/05/2015 0846      STUDIES: Mr Abdomen W Wo Contrast  02/04/2015  CLINICAL DATA:  75 year old female with history of breast cancer with bone metastasis. New liver lesions. EXAM: MRI ABDOMEN WITHOUT AND WITH CONTRAST TECHNIQUE: Multiplanar multisequence MR imaging of the abdomen was performed both before and after the administration of intravenous contrast. CONTRAST:  56m MULTIHANCE GADOBENATE DIMEGLUMINE 529 MG/ML IV SOLN COMPARISON:  None. FINDINGS: Lower chest: Again noted is a plaque-like area of pleural thickening in the lateral aspect of  the lower right hemithorax measuring approximately 2.2 x 0.8 cm (image 15 of series 1201). Hepatobiliary: There are numerous (greater than 20) hepatic lesions on today's study which are T1 hypointense, mildly T2 hyperintense, restrict diffusion, and demonstrates progressive central enhancement on post gadolinium images, compatible with multiple new metastatic lesions. These are best demonstrated on precontrast T1 weighted images, with the largest of these lesions measures up to 1.3 x 1.7 cm (image 57 of series 1200) in segment 3 of the liver anteriorly. No intra or extrahepatic biliary ductal dilatation. Numerous tiny filling defects in the gallbladder are compatible with small gallstones. No current findings to suggest an acute  cholecystitis at this time. Pancreas: Diffuse fatty atrophy in the pancreas. No pancreatic mass. No pancreatic ductal dilatation. No pancreatic or peripancreatic fluid or inflammatory changes. Spleen: Unremarkable. Adrenals/Urinary Tract: Several sub cm lesions are noted in the kidneys bilaterally, T1 hypointense, T2 hyperintense and nonenhancing, compatible with tiny cysts. No hydroureteronephrosis in the visualized abdomen. Bilateral adrenal glands are normal in appearance. Stomach/Bowel: Visualized portions are unremarkable. Vascular/Lymphatic: No aneurysm identified in the visualized abdominal vasculature. Extensive atherosclerotic disease throughout the abdominal aorta. No lymphadenopathy noted in the abdomen. Other: No significant volume of ascites noted in the visualized peritoneal cavity. Musculoskeletal: No definite osseous lesions identified in the visualized portions of the skeleton on today's examination. IMPRESSION: 1. Numerous new hepatic lesions throughout all aspects of the liver measuring up to 1.3 x 1.7 cm in segment 3 of the liver, compatible with widespread hepatic metastases. 2. Plaque-like area of enhancing pleural thickening in the inferior aspect of the right hemithorax again noted. This is nonspecific, but pleural metastasis is not excluded, and continued attention on followup studies is recommended. 3. Cholelithiasis without evidence of acute cholecystitis at this time. 4. Additional incidental findings, as above. Electronically Signed   By: Trudie Reed M.D.   On: 02/04/2015 08:18   US Biopsy  02/05/2015  CLINICAL DATA:  History of breast carcinoma metastatic to bone. Imaging has revealed multiple new liver lesions and the patient presents for liver biopsy. EXAM: ULTRASOUND GUIDED CORE BIOPSY OF LIVER MEDICATIONS: 1.0 mg IV Versed; 50 mcg IV Fentanyl Total Moderate Sedation Time: 14 minutes. PROCEDURE: The procedure, risks, benefits, and alternatives were explained to the patient.  Questions regarding the procedure were encouraged and answered. The patient understands and consents to the procedure. The epigastric region was prepped with Betadine in a sterile fashion, and a sterile drape was applied covering the operative field. A sterile gown and sterile gloves were used for the procedure. Local anesthesia was provided with 1% Lidocaine. Ultrasound was used to localize liver lesions. Under direct ultrasound guidance, a 17 gauge needle was advanced into the left lobe of the liver. Three separate core biopsy samples were obtained and submitted in formalin. The outer needle was removed and additional ultrasound performed. COMPLICATIONS: None. FINDINGS: Multiple small lesions are identified throughout the liver which are predominantly mildly hypoechoic relative to adjacent liver parenchyma. A lesion in the lateral segment of the left lobe of the liver was chosen for sampling and measures approximately 1.2 x 1.7 cm. Solid tissue was obtained. IMPRESSION: Ultrasound-guided core biopsy performed of one of several lesions in the liver. A lesion measuring 1.7 cm in greatest diameter within the left lobe of the liver was sampled. Breast prognostic studies were requested. Electronically Signed   By: Irish Lack M.D.   On: 02/05/2015 13:39    ASSESSMENT: 75 y.o. [place] woman:   (1) Status post  right breast biopsy in 05/2009 for a grade 2 invasive ductal carcinoma, T2 NX M1, Stage IV, strongly estrogen and progesterone receptor-positive, HER-2-negative with an MIB-1 of 26%.   (2) Neoadjuvantly she received Letrozole and zoledronic acid beginning in June of 2011 and underwent right modified radical mastectomy February of 2012 for a ypT2 ypN2, grade 1 invasive ductal carcinoma with negative margins.   (3) She completed radiation therapy in August of 2012.   (4) She has continued on Letrozole but was switched from Zoledronic acid to Denosumab Delton See) because of concerns regarding her serum  creatinine. Delton See was being given every 8 weeks.  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  (6) symptomatic anemia, with creatinine clearance < 60 cc/min; Darbepoietin Q14d started 11/03/2011; received Feraheme 01/05/2012; ferritin August 2016 was greater than 800  (7) On subcutaneous B-12 supplementation monthly.  (8) Pain from osseous metastasis, osteoarthritis, tendinopathy, and bursitis as confirmed by recent MRI.  (9) Anemia, multifactorial with renal disease, on Aranesp monthly, increased to every 2 weeks June 2016  (10)  letrozole was discontinued in September 2014 with evidence of disease progression. She was started on fulvestrant injections, first given on 10/17/2012; stopped 06/03/2014 with progression  (11) exemestane and everolimus started 07/08/2014  (a) everolimus discontinued August 2016 with pneumonitis  (b) exemestane held at the start of everolimus January 2017  (12) liver biopsy 02/05/2015 confirms metastatic breast cancer to the liver, estrogen and progesterone receptor positive, HER-2 negative  (13) eribulin starting 02/17/2015, to be given days 1 and 8 of each 21 day cycle   PLAN: Laiklynn is stable today. The labs were reviewed in detail and her Dup[place]er is down to 0.9. I consulted with Dr. [name], and he suggests proceeding with day 8 of eribulin, and adding neulasta onpro to avoid treatment delays due to neutropenia. We discussed neulasta and she understands bone pain is a common side effect of this drug. She has been advised to take claritin for 3-4 days starting tomorrow.We are also dropping the dose of eribulin by 15%..  She requested something for her headache, so she will receive a small quantity of ibuprofen with her other medicines. This along with a creatinine of 1.7 and low blood pressure, suggests mild dehydration. I am adding IV fluids  at 500cc/hr.   She is already scheduled for follow up with Dr. Anitra Lauth next week to discuss the changes to her blood pressure medicines.   Sidni will return in 2 weeks for the start of cycle 2. She understands and agrees with this plan. She knows the goal of treatment in her case is control. She has been encouraged to call with any issues that might arise before her next visit here.   Laurie Panda, NP 02/24/2015  12:13 PM

## 2015-02-24 NOTE — Patient Instructions (Signed)

## 2015-02-24 NOTE — Patient Instructions (Signed)
[facility] Discharge Instructions for Patients Receiving Chemotherapy  Today you received the following chemotherapy agents [place]   If you develop nausea and vomiting that is not controlled by your nausea medication, call the clinic.   BELOW ARE SYMPTOMS THAT SHOULD BE REPORTED IMMEDIATELY:  *FEVER GREATER THAN 100.5 F  *CHILLS WITH OR WITHOUT FEVER  NAUSEA AND VOMITING THAT IS NOT CONTROLLED WITH YOUR NAUSEA MEDICATION  *UNUSUAL SHORTNESS OF BREATH  *UNUSUAL BRUISING OR BLEEDING  TENDERNESS IN MOUTH AND THROAT WITH OR WITHOUT PRESENCE OF ULCERS  *URINARY PROBLEMS  *BOWEL PROBLEMS  UNUSUAL RASH Items with * indicate a potential emergency and should be followed up as soon as possible.  Feel free to call the clinic you have any questions or concerns. The clinic phone number is [phone].  Please show the [place] at check-in to the Emergency Department and triage nurse.

## 2015-03-02 ENCOUNTER — Inpatient Hospital Stay (HOSPITAL_BASED_OUTPATIENT_CLINIC_OR_DEPARTMENT_OTHER)
Admission: EM | Admit: 2015-03-02 | Discharge: 2015-03-25 | DRG: 871 | Disposition: A | Discharge status: Skilled Nursing Facility | Payer: Medicare Other | Attending: Internal Medicine | Admitting: Internal Medicine

## 2015-03-02 ENCOUNTER — Ambulatory Visit (INDEPENDENT_AMBULATORY_CARE_PROVIDER_SITE_OTHER): Payer: Medicare Other | Admitting: Family Medicine

## 2015-03-02 ENCOUNTER — Encounter: Payer: Self-pay | Admitting: Family Medicine

## 2015-03-02 ENCOUNTER — Encounter (HOSPITAL_BASED_OUTPATIENT_CLINIC_OR_DEPARTMENT_OTHER): Payer: Self-pay | Admitting: *Deleted

## 2015-03-02 ENCOUNTER — Emergency Department (HOSPITAL_BASED_OUTPATIENT_CLINIC_OR_DEPARTMENT_OTHER): Payer: Medicare Other

## 2015-03-02 VITALS — BP 110/52 | HR 121 | Temp 98.1°F | Resp 16 | Ht 62.0 in | Wt 159.5 lb

## 2015-03-02 DIAGNOSIS — G894 Chronic pain syndrome: Secondary | ICD-10-CM | POA: Diagnosis present

## 2015-03-02 DIAGNOSIS — M310 Hypersensitivity angiitis: Secondary | ICD-10-CM | POA: Diagnosis present

## 2015-03-02 DIAGNOSIS — D631 Anemia in chronic kidney disease: Secondary | ICD-10-CM | POA: Diagnosis not present

## 2015-03-02 DIAGNOSIS — A499 Bacterial infection, unspecified: Secondary | ICD-10-CM | POA: Diagnosis not present

## 2015-03-02 DIAGNOSIS — I255 Ischemic cardiomyopathy: Secondary | ICD-10-CM | POA: Clinically undetermined

## 2015-03-02 DIAGNOSIS — G9340 Encephalopathy, unspecified: Secondary | ICD-10-CM | POA: Insufficient documentation

## 2015-03-02 DIAGNOSIS — Z8249 Family history of ischemic heart disease and other diseases of the circulatory system: Secondary | ICD-10-CM

## 2015-03-02 DIAGNOSIS — N184 Chronic kidney disease, stage 4 (severe): Secondary | ICD-10-CM | POA: Diagnosis present

## 2015-03-02 DIAGNOSIS — R601 Generalized edema: Secondary | ICD-10-CM

## 2015-03-02 DIAGNOSIS — R778 Other specified abnormalities of plasma proteins: Secondary | ICD-10-CM

## 2015-03-02 DIAGNOSIS — I4719 Other supraventricular tachycardia: Secondary | ICD-10-CM | POA: Clinically undetermined

## 2015-03-02 DIAGNOSIS — Z1612 Extended spectrum beta lactamase (ESBL) resistance: Secondary | ICD-10-CM

## 2015-03-02 DIAGNOSIS — I491 Atrial premature depolarization: Secondary | ICD-10-CM | POA: Diagnosis present

## 2015-03-02 DIAGNOSIS — Z833 Family history of diabetes mellitus: Secondary | ICD-10-CM | POA: Diagnosis not present

## 2015-03-02 DIAGNOSIS — E039 Hypothyroidism, unspecified: Secondary | ICD-10-CM | POA: Diagnosis present

## 2015-03-02 DIAGNOSIS — Z8261 Family history of arthritis: Secondary | ICD-10-CM

## 2015-03-02 DIAGNOSIS — C50919 Malignant neoplasm of unspecified site of unspecified female breast: Secondary | ICD-10-CM | POA: Diagnosis present

## 2015-03-02 DIAGNOSIS — I129 Hypertensive chronic kidney disease with stage 1 through stage 4 chronic kidney disease, or unspecified chronic kidney disease: Secondary | ICD-10-CM | POA: Diagnosis present

## 2015-03-02 DIAGNOSIS — R7989 Other specified abnormal findings of blood chemistry: Secondary | ICD-10-CM | POA: Diagnosis not present

## 2015-03-02 DIAGNOSIS — I2510 Atherosclerotic heart disease of native coronary artery without angina pectoris: Secondary | ICD-10-CM | POA: Diagnosis present

## 2015-03-02 DIAGNOSIS — Z853 Personal history of malignant neoplasm of breast: Secondary | ICD-10-CM

## 2015-03-02 DIAGNOSIS — C799 Secondary malignant neoplasm of unspecified site: Secondary | ICD-10-CM

## 2015-03-02 DIAGNOSIS — E876 Hypokalemia: Secondary | ICD-10-CM | POA: Diagnosis present

## 2015-03-02 DIAGNOSIS — J069 Acute upper respiratory infection, unspecified: Secondary | ICD-10-CM | POA: Diagnosis not present

## 2015-03-02 DIAGNOSIS — R0600 Dyspnea, unspecified: Secondary | ICD-10-CM

## 2015-03-02 DIAGNOSIS — J9600 Acute respiratory failure, unspecified whether with hypoxia or hypercapnia: Secondary | ICD-10-CM | POA: Insufficient documentation

## 2015-03-02 DIAGNOSIS — C787 Secondary malignant neoplasm of liver and intrahepatic bile duct: Secondary | ICD-10-CM | POA: Diagnosis not present

## 2015-03-02 DIAGNOSIS — C50911 Malignant neoplasm of unspecified site of right female breast: Secondary | ICD-10-CM | POA: Diagnosis present

## 2015-03-02 DIAGNOSIS — I5021 Acute systolic (congestive) heart failure: Secondary | ICD-10-CM | POA: Diagnosis present

## 2015-03-02 DIAGNOSIS — E118 Type 2 diabetes mellitus with unspecified complications: Secondary | ICD-10-CM | POA: Diagnosis not present

## 2015-03-02 DIAGNOSIS — Z803 Family history of malignant neoplasm of breast: Secondary | ICD-10-CM | POA: Diagnosis not present

## 2015-03-02 DIAGNOSIS — M3589 Other specified systemic involvement of connective tissue: Secondary | ICD-10-CM | POA: Diagnosis present

## 2015-03-02 DIAGNOSIS — G893 Neoplasm related pain (acute) (chronic): Secondary | ICD-10-CM

## 2015-03-02 DIAGNOSIS — J209 Acute bronchitis, unspecified: Secondary | ICD-10-CM | POA: Diagnosis not present

## 2015-03-02 DIAGNOSIS — I469 Cardiac arrest, cause unspecified: Secondary | ICD-10-CM | POA: Diagnosis present

## 2015-03-02 DIAGNOSIS — C7951 Secondary malignant neoplasm of bone: Secondary | ICD-10-CM | POA: Diagnosis present

## 2015-03-02 DIAGNOSIS — I6529 Occlusion and stenosis of unspecified carotid artery: Secondary | ICD-10-CM | POA: Diagnosis present

## 2015-03-02 DIAGNOSIS — I340 Nonrheumatic mitral (valve) insufficiency: Secondary | ICD-10-CM | POA: Diagnosis present

## 2015-03-02 DIAGNOSIS — E162 Hypoglycemia, unspecified: Secondary | ICD-10-CM | POA: Diagnosis not present

## 2015-03-02 DIAGNOSIS — A4151 Sepsis due to Escherichia coli [E. coli]: Secondary | ICD-10-CM | POA: Diagnosis not present

## 2015-03-02 DIAGNOSIS — Z823 Family history of stroke: Secondary | ICD-10-CM

## 2015-03-02 DIAGNOSIS — Z4659 Encounter for fitting and adjustment of other gastrointestinal appliance and device: Secondary | ICD-10-CM

## 2015-03-02 DIAGNOSIS — R55 Syncope and collapse: Secondary | ICD-10-CM | POA: Diagnosis not present

## 2015-03-02 DIAGNOSIS — I248 Other forms of acute ischemic heart disease: Secondary | ICD-10-CM | POA: Diagnosis present

## 2015-03-02 DIAGNOSIS — I471 Supraventricular tachycardia: Secondary | ICD-10-CM | POA: Diagnosis present

## 2015-03-02 DIAGNOSIS — R29898 Other symptoms and signs involving the musculoskeletal system: Secondary | ICD-10-CM

## 2015-03-02 DIAGNOSIS — R0682 Tachypnea, not elsewhere classified: Secondary | ICD-10-CM

## 2015-03-02 DIAGNOSIS — R569 Unspecified convulsions: Secondary | ICD-10-CM | POA: Diagnosis not present

## 2015-03-02 DIAGNOSIS — Z7982 Long term (current) use of aspirin: Secondary | ICD-10-CM

## 2015-03-02 DIAGNOSIS — D6481 Anemia due to antineoplastic chemotherapy: Secondary | ICD-10-CM | POA: Diagnosis not present

## 2015-03-02 DIAGNOSIS — G4089 Other seizures: Secondary | ICD-10-CM | POA: Diagnosis present

## 2015-03-02 DIAGNOSIS — D702 Other drug-induced agranulocytosis: Secondary | ICD-10-CM

## 2015-03-02 DIAGNOSIS — J189 Pneumonia, unspecified organism: Secondary | ICD-10-CM | POA: Diagnosis present

## 2015-03-02 DIAGNOSIS — Z66 Do not resuscitate: Secondary | ICD-10-CM | POA: Diagnosis present

## 2015-03-02 DIAGNOSIS — Z800 Family history of malignant neoplasm of digestive organs: Secondary | ICD-10-CM

## 2015-03-02 DIAGNOSIS — D649 Anemia, unspecified: Secondary | ICD-10-CM | POA: Diagnosis not present

## 2015-03-02 DIAGNOSIS — M358 Other specified systemic involvement of connective tissue: Secondary | ICD-10-CM | POA: Diagnosis present

## 2015-03-02 DIAGNOSIS — I10 Essential (primary) hypertension: Secondary | ICD-10-CM | POA: Diagnosis present

## 2015-03-02 DIAGNOSIS — Z9011 Acquired absence of right breast and nipple: Secondary | ICD-10-CM | POA: Diagnosis not present

## 2015-03-02 DIAGNOSIS — Z87891 Personal history of nicotine dependence: Secondary | ICD-10-CM

## 2015-03-02 DIAGNOSIS — A419 Sepsis, unspecified organism: Principal | ICD-10-CM | POA: Diagnosis present

## 2015-03-02 DIAGNOSIS — I2489 Other forms of acute ischemic heart disease: Secondary | ICD-10-CM | POA: Diagnosis present

## 2015-03-02 DIAGNOSIS — L8990 Pressure ulcer of unspecified site, unspecified stage: Secondary | ICD-10-CM | POA: Diagnosis present

## 2015-03-02 DIAGNOSIS — R160 Hepatomegaly, not elsewhere classified: Secondary | ICD-10-CM

## 2015-03-02 DIAGNOSIS — R4182 Altered mental status, unspecified: Secondary | ICD-10-CM | POA: Diagnosis not present

## 2015-03-02 DIAGNOSIS — J9601 Acute respiratory failure with hypoxia: Secondary | ICD-10-CM | POA: Diagnosis not present

## 2015-03-02 LAB — CREATININE, SERUM
Creatinine, Ser: 1.36 mg/dL — ABNORMAL HIGH (ref 0.44–1.00)
GFR calc Af Amer: 44 mL/min — ABNORMAL LOW (ref >60)
GFR calc non Af Amer: 38 mL/min — ABNORMAL LOW (ref >60)

## 2015-03-02 LAB — URINE MICROSCOPIC-ADD ON

## 2015-03-02 LAB — CBC WITH DIFFERENTIAL/PLATELET
BAND NEUTROPHILS: 7 %
Basophils Absolute: 0 10*3/uL (ref 0.0–0.1)
Basophils Relative: 0 %
Eosinophils Absolute: 0 10*3/uL (ref 0.0–0.7)
Eosinophils Relative: 0 %
HCT: 29 % — ABNORMAL LOW (ref 36.0–46.0)
HEMOGLOBIN: 8.9 g/dL — AB (ref 12.0–15.0)
LYMPHS PCT: 23 %
Lymphs Abs: 1.4 10*3/uL (ref 0.7–4.0)
MCH: 28.5 pg (ref 26.0–34.0)
MCHC: 30.7 g/dL (ref 30.0–36.0)
MCV: 92.9 fL (ref 78.0–100.0)
METAMYELOCYTES PCT: 1 %
Monocytes Absolute: 0.7 10*3/uL (ref 0.1–1.0)
Monocytes Relative: 12 %
NEUTROS ABS: 3.9 10*3/uL (ref 1.7–7.7)
NRBC: 1 /100{WBCs} — AB
Neutrophils Relative %: 56 %
Platelets: 192 10*3/uL (ref 150–400)
Promyelocytes Absolute: 1 %
RBC: 3.12 MIL/uL — ABNORMAL LOW (ref 3.87–5.11)
RDW: 15.8 % — ABNORMAL HIGH (ref 11.5–15.5)
WBC: 6 10*3/uL (ref 4.0–10.5)

## 2015-03-02 LAB — URINALYSIS, ROUTINE W REFLEX MICROSCOPIC
Glucose, UA: NEGATIVE mg/dL
Hgb urine dipstick: NEGATIVE
KETONES UR: NEGATIVE mg/dL
NITRITE: NEGATIVE
Protein, ur: NEGATIVE mg/dL
SPECIFIC GRAVITY, URINE: 1.017 (ref 1.005–1.030)
pH: 5.5 (ref 5.0–8.0)

## 2015-03-02 LAB — INFLUENZA PANEL BY PCR (TYPE A & B)
H1N1FLUPCR: NOT DETECTED
INFLAPCR: NEGATIVE
INFLBPCR: NEGATIVE

## 2015-03-02 LAB — COMPREHENSIVE METABOLIC PANEL
ALBUMIN: 2.9 g/dL — AB (ref 3.5–5.0)
ALK PHOS: 94 U/L (ref 38–126)
ALT: 11 U/L — ABNORMAL LOW (ref 14–54)
ANION GAP: 16 — AB (ref 5–15)
AST: 28 U/L (ref 15–41)
BUN: 45 mg/dL — ABNORMAL HIGH (ref 6–20)
CHLORIDE: 95 mmol/L — AB (ref 101–111)
CO2: 21 mmol/L — AB (ref 22–32)
Calcium: 7.6 mg/dL — ABNORMAL LOW (ref 8.9–10.3)
Creatinine, Ser: 1.48 mg/dL — ABNORMAL HIGH (ref 0.44–1.00)
GFR calc non Af Amer: 34 mL/min — ABNORMAL LOW (ref >60)
GFR, EST AFRICAN AMERICAN: 40 mL/min — AB (ref >60)
GLUCOSE: 193 mg/dL — AB (ref 65–99)
POTASSIUM: 4 mmol/L (ref 3.5–5.1)
SODIUM: 132 mmol/L — AB (ref 135–145)
Total Bilirubin: 1.4 mg/dL — ABNORMAL HIGH (ref 0.3–1.2)
Total Protein: 6.3 g/dL — ABNORMAL LOW (ref 6.5–8.1)

## 2015-03-02 LAB — LACTIC ACID, PLASMA: LACTIC ACID, VENOUS: 2.5 mmol/L — AB (ref 0.5–2.0)

## 2015-03-02 LAB — CBC
HCT: 23.7 % — ABNORMAL LOW (ref 36.0–46.0)
Hemoglobin: 7.5 g/dL — ABNORMAL LOW (ref 12.0–15.0)
MCH: 29.4 pg (ref 26.0–34.0)
MCHC: 31.6 g/dL (ref 30.0–36.0)
MCV: 92.9 fL (ref 78.0–100.0)
PLATELETS: 161 10*3/uL (ref 150–400)
RBC: 2.55 MIL/uL — ABNORMAL LOW (ref 3.87–5.11)
RDW: 16.1 % — AB (ref 11.5–15.5)
WBC: 3.9 10*3/uL — ABNORMAL LOW (ref 4.0–10.5)

## 2015-03-02 LAB — I-STAT CG4 LACTIC ACID, ED
LACTIC ACID, VENOUS: 4.17 mmol/L — AB (ref 0.5–2.0)
Lactic Acid, Venous: 2.26 mmol/L (ref 0.5–2.0)

## 2015-03-02 LAB — MRSA PCR SCREENING: MRSA by PCR: NEGATIVE

## 2015-03-02 LAB — APTT: aPTT: 44 seconds — ABNORMAL HIGH (ref 24–37)

## 2015-03-02 LAB — PROCALCITONIN: PROCALCITONIN: 2.93 ng/mL

## 2015-03-02 LAB — PROTIME-INR
INR: 1.34 (ref 0.00–1.49)
PROTHROMBIN TIME: 16.7 s — AB (ref 11.6–15.2)

## 2015-03-02 MED ORDER — MORPHINE SULFATE 15 MG PO TABS
15.0000 mg | ORAL_TABLET | ORAL | Status: DC | PRN
Start: 2015-03-02 — End: 2015-03-10

## 2015-03-02 MED ORDER — ONDANSETRON HCL 4 MG PO TABS: 4.0000 mg | ORAL_TABLET | Freq: Four times a day (QID) | ORAL | Status: DC | PRN

## 2015-03-02 MED ORDER — CLOPIDOGREL BISULFATE 75 MG PO TABS
75.0000 mg | ORAL_TABLET | Freq: Every day | ORAL | Status: DC
  Administered 2015-03-02 – 2015-03-25 (×22): 75 mg via ORAL
  Filled 2015-03-02 (×24): qty 1

## 2015-03-02 MED ORDER — SODIUM CHLORIDE 0.9 % IV BOLUS (SEPSIS): 500.0000 mL | INTRAVENOUS | Status: AC

## 2015-03-02 MED ORDER — MIRTAZAPINE 15 MG PO TABS
15.0000 mg | ORAL_TABLET | Freq: Every day | ORAL | Status: DC
  Administered 2015-03-02 – 2015-03-09 (×7): 15 mg via ORAL
  Filled 2015-03-02 (×8): qty 1

## 2015-03-02 MED ORDER — CARVEDILOL 12.5 MG PO TABS
12.5000 mg | ORAL_TABLET | Freq: Two times a day (BID) | ORAL | Status: DC
  Administered 2015-03-02 – 2015-03-07 (×10): 12.5 mg via ORAL
  Filled 2015-03-02 (×10): qty 1

## 2015-03-02 MED ORDER — SODIUM CHLORIDE 0.9 % IV SOLN: 1000.0000 mL | INTRAVENOUS | Status: DC

## 2015-03-02 MED ORDER — SODIUM CHLORIDE 0.9 % IV BOLUS (SEPSIS)
1000.0000 mL | INTRAVENOUS | Status: AC
  Administered 2015-03-02 (×2): 1000 mL via INTRAVENOUS

## 2015-03-02 MED ORDER — ONDANSETRON HCL 4 MG/2ML IJ SOLN
4.0000 mg | Freq: Four times a day (QID) | INTRAMUSCULAR | Status: DC | PRN
  Administered 2015-03-03 – 2015-03-11 (×6): 4 mg via INTRAVENOUS
  Filled 2015-03-02 (×7): qty 2

## 2015-03-02 MED ORDER — PANTOPRAZOLE SODIUM 40 MG PO TBEC
40.0000 mg | DELAYED_RELEASE_TABLET | Freq: Every day | ORAL | Status: DC
  Administered 2015-03-02 – 2015-03-09 (×8): 40 mg via ORAL
  Filled 2015-03-02 (×9): qty 1

## 2015-03-02 MED ORDER — GLYCERIN (LAXATIVE) 2 G RE SUPP
1.0000 | Freq: Once | RECTAL | Status: DC | PRN
  Filled 2015-03-02: qty 1

## 2015-03-02 MED ORDER — FENTANYL CITRATE (PF) 100 MCG/2ML IJ SOLN
50.0000 ug | Freq: Once | INTRAMUSCULAR | Status: AC
  Administered 2015-03-02: 50 ug via INTRAVENOUS
  Filled 2015-03-02: qty 2

## 2015-03-02 MED ORDER — ACETAMINOPHEN 650 MG RE SUPP: 650.0000 mg | Freq: Four times a day (QID) | RECTAL | Status: DC | PRN

## 2015-03-02 MED ORDER — SODIUM CHLORIDE 0.9 % IV BOLUS (SEPSIS)
500.0000 mL | Freq: Once | INTRAVENOUS | Status: AC
  Administered 2015-03-02: 500 mL via INTRAVENOUS

## 2015-03-02 MED ORDER — LEVOTHYROXINE SODIUM 150 MCG PO TABS
150.0000 ug | ORAL_TABLET | Freq: Every day | ORAL | Status: DC
  Administered 2015-03-02 – 2015-03-22 (×20): 150 ug via ORAL
  Filled 2015-03-02 (×3): qty 2
  Filled 2015-03-02 (×3): qty 1
  Filled 2015-03-02 (×3): qty 2
  Filled 2015-03-02: qty 1
  Filled 2015-03-02 (×3): qty 2
  Filled 2015-03-02 (×2): qty 1
  Filled 2015-03-02: qty 2
  Filled 2015-03-02 (×3): qty 1
  Filled 2015-03-02: qty 2
  Filled 2015-03-02: qty 1

## 2015-03-02 MED ORDER — CYANOCOBALAMIN 1000 MCG/ML IJ SOLN
1000.0000 ug | INTRAMUSCULAR | Status: DC
  Administered 2015-03-03: 1000 ug via INTRAMUSCULAR
  Filled 2015-03-02 (×2): qty 1

## 2015-03-02 MED ORDER — SODIUM CHLORIDE 0.9 % IV SOLN
INTRAVENOUS | Status: DC
Start: 2015-03-02 — End: 2015-03-03
  Administered 2015-03-02: 17:00:00 via INTRAVENOUS

## 2015-03-02 MED ORDER — ENOXAPARIN SODIUM 40 MG/0.4ML ~~LOC~~ SOLN
40.0000 mg | SUBCUTANEOUS | Status: DC
  Administered 2015-03-02 – 2015-03-03 (×2): 40 mg via SUBCUTANEOUS
  Filled 2015-03-02 (×3): qty 0.4

## 2015-03-02 MED ORDER — PIPERACILLIN-TAZOBACTAM 3.375 G IVPB 30 MIN
3.3750 g | Freq: Once | INTRAVENOUS | Status: AC
  Administered 2015-03-02: 3.375 g via INTRAVENOUS
  Filled 2015-03-02 (×2): qty 50

## 2015-03-02 MED ORDER — PIPERACILLIN-TAZOBACTAM 3.375 G IVPB
3.3750 g | Freq: Three times a day (TID) | INTRAVENOUS | Status: DC
  Administered 2015-03-02 – 2015-03-05 (×9): 3.375 g via INTRAVENOUS
  Filled 2015-03-02 (×7): qty 50

## 2015-03-02 MED ORDER — CALCIUM CARBONATE ANTACID 500 MG PO CHEW
1.0000 | CHEWABLE_TABLET | Freq: Three times a day (TID) | ORAL | Status: DC
  Administered 2015-03-02 – 2015-03-22 (×43): 200 mg via ORAL
  Filled 2015-03-02 (×59): qty 1

## 2015-03-02 MED ORDER — ONDANSETRON HCL 4 MG/2ML IJ SOLN
4.0000 mg | Freq: Once | INTRAMUSCULAR | Status: AC
  Administered 2015-03-02: 4 mg via INTRAVENOUS
  Filled 2015-03-02: qty 2

## 2015-03-02 MED ORDER — NITROGLYCERIN 0.4 MG SL SUBL: 0.4000 mg | SUBLINGUAL_TABLET | SUBLINGUAL | Status: DC | PRN

## 2015-03-02 MED ORDER — VANCOMYCIN HCL IN DEXTROSE 1-5 GM/200ML-% IV SOLN
1000.0000 mg | INTRAVENOUS | Status: DC
  Administered 2015-03-03 – 2015-03-05 (×3): 1000 mg via INTRAVENOUS
  Filled 2015-03-02 (×3): qty 200

## 2015-03-02 MED ORDER — INSULIN DETEMIR 100 UNIT/ML ~~LOC~~ SOLN
15.0000 [IU] | Freq: Every day | SUBCUTANEOUS | Status: DC
  Administered 2015-03-02: 15 [IU] via SUBCUTANEOUS
  Filled 2015-03-02: qty 0.15

## 2015-03-02 MED ORDER — FENTANYL CITRATE (PF) 100 MCG/2ML IJ SOLN
25.0000 ug | Freq: Once | INTRAMUSCULAR | Status: AC
  Administered 2015-03-02: 25 ug via INTRAVENOUS
  Filled 2015-03-02: qty 2

## 2015-03-02 MED ORDER — POLYETHYLENE GLYCOL 3350 17 G PO PACK: 17.0000 g | PACK | Freq: Every day | ORAL | Status: DC | PRN

## 2015-03-02 MED ORDER — ACETAMINOPHEN 325 MG PO TABS: 650.0000 mg | ORAL_TABLET | Freq: Once | ORAL | Status: DC | PRN

## 2015-03-02 MED ORDER — SODIUM CHLORIDE 0.9 % IV SOLN
INTRAVENOUS | Status: DC
  Administered 2015-03-02: 17:00:00 via INTRAVENOUS

## 2015-03-02 MED ORDER — VANCOMYCIN HCL IN DEXTROSE 1-5 GM/200ML-% IV SOLN
1000.0000 mg | Freq: Once | INTRAVENOUS | Status: AC
  Administered 2015-03-02: 1000 mg via INTRAVENOUS
  Filled 2015-03-02: qty 200

## 2015-03-02 MED ORDER — MORPHINE SULFATE ER 30 MG PO TBCR
30.0000 mg | EXTENDED_RELEASE_TABLET | Freq: Three times a day (TID) | ORAL | Status: DC
  Administered 2015-03-02 – 2015-03-06 (×12): 30 mg via ORAL
  Filled 2015-03-02 (×14): qty 1

## 2015-03-02 MED ORDER — ACETAMINOPHEN 325 MG RE SUPP
325.0000 mg | Freq: Once | RECTAL | Status: AC
  Administered 2015-03-02: 650 mg via RECTAL

## 2015-03-02 MED ORDER — ASPIRIN 81 MG PO CHEW
81.0000 mg | CHEWABLE_TABLET | Freq: Every day | ORAL | Status: DC
  Administered 2015-03-02 – 2015-03-25 (×22): 81 mg via ORAL
  Filled 2015-03-02 (×24): qty 1

## 2015-03-02 MED ORDER — GABAPENTIN 100 MG PO CAPS
100.0000 mg | ORAL_CAPSULE | Freq: Three times a day (TID) | ORAL | Status: DC
  Administered 2015-03-02 – 2015-03-08 (×17): 100 mg via ORAL
  Filled 2015-03-02 (×19): qty 1

## 2015-03-02 MED ORDER — SENNOSIDES-DOCUSATE SODIUM 8.6-50 MG PO TABS
1.0000 | ORAL_TABLET | Freq: Every day | ORAL | Status: DC
  Administered 2015-03-02 – 2015-03-08 (×6): 1 via ORAL
  Filled 2015-03-02 (×7): qty 1

## 2015-03-02 MED ORDER — ACETAMINOPHEN 325 MG PO TABS
650.0000 mg | ORAL_TABLET | Freq: Four times a day (QID) | ORAL | Status: DC | PRN
  Administered 2015-03-05 – 2015-03-22 (×4): 650 mg via ORAL
  Filled 2015-03-02 (×4): qty 2

## 2015-03-02 MED ORDER — ATORVASTATIN CALCIUM 80 MG PO TABS
80.0000 mg | ORAL_TABLET | Freq: Every day | ORAL | Status: DC
  Administered 2015-03-02 – 2015-03-25 (×22): 80 mg via ORAL
  Filled 2015-03-02 (×2): qty 1
  Filled 2015-03-02: qty 2
  Filled 2015-03-02: qty 1
  Filled 2015-03-02 (×2): qty 2
  Filled 2015-03-02 (×4): qty 1
  Filled 2015-03-02 (×2): qty 2
  Filled 2015-03-02 (×3): qty 1
  Filled 2015-03-02: qty 2
  Filled 2015-03-02: qty 1
  Filled 2015-03-02 (×4): qty 2
  Filled 2015-03-02: qty 1
  Filled 2015-03-02: qty 2
  Filled 2015-03-02: qty 1

## 2015-03-02 MED ORDER — ACETAMINOPHEN 650 MG RE SUPP
RECTAL | Status: AC
  Administered 2015-03-02: 650 mg via RECTAL
  Filled 2015-03-02: qty 1

## 2015-03-02 NOTE — ED Notes (Signed)
Cough, congestion, body aches, chills and headache x 2 days.

## 2015-03-02 NOTE — Progress Notes (Signed)
Pre visit review using our clinic review tool, if applicable. No additional management support is needed unless otherwise documented below in the visit note. 

## 2015-03-02 NOTE — ED Provider Notes (Signed)
CSN: [id number]     Arrival date & time 03/02/15  1055 History   First MD Initiated Contact with Patient 03/02/15 1110     No chief complaint on file.    (Consider location/radiation/quality/duration/timing/severity/associated sxs/prior Treatment) The history is provided by the patient.   75 year old female with metastatic breast cancer currently undergoing chemotherapy. Started to feel bad on Sunday morning with cough congestion body aches chills and headache which is really left-sided neck pain that radiates to the head. Patient did have pneumonia back in July. Patient has had her flu immunization. Patient currently undergoing chemotherapy. Followed by hematology oncology at [facility]. No nausea no vomiting no dysuria. No fevers the patient did have chills.  Past Medical History  Diagnosis Date  . 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 Anxiety and depression   . GERD (gastroesophageal reflux disease)   . Hyperlipidemia   . Hypothyroidism   . Osteoarthritis   . Carotid artery stenosis     bilateral  . CAD (coronary artery disease)     medical therapy, old tot RCA, patent LAD, mod. severe ostial LCX stenosis--PCI would be too complex-med mgmt recommended.  . OSA (obstructive sleep apnea)   . Hypertension   . Carotid artery occlusion   . Unspecified constipation 03/15/2012  . Bursitis   . Collagen vascular disease ([place])   . History of radiation therapy 10/31/12-11/13/12    rt&lt humerous 30Gy/50f  . Valvular heart disease 07/11/2013  . Diarrhea 09/28/2013  . Chronic renal insufficiency, stage II (mild)     Borderline stage II/III, CrCl 50s/60s.  . History of blood transfusion   . Urinary tract bacterial infections   . Breast cancer ([place] 05[place]2009    Grade 2 invasive ductal carcinoma, T2 NXM1, stage IV at presentation. There was bone only involvement. New liver lesions 12/2014 (path confirmed mets): pt to start chemo (eribucin) treatments to  start on February 17, 2015. She will receive treatment on the first and eighth day of each 21 day cycle. The plan is to go to 3 or 4 cycles before restaging  . Diabetes mellitus type II     insulin  . Hypocalcemia 07/2014    required IV calcium in ED once and was admitted to hosp once.  [facility]  . Drug-induced pneumonitis 08/2014    (Everolimus) CXR 09/21/14 improved; needs another CXR 2-3 wks later to make sure resolution occurs.   Past Surgical History  Procedure Laterality Date  . Incisional breast biopsy      remoted left breast biopsy  . Cesarean section    . Other surgical history      GYN surgery  . Knee surgery      right knee surgery  . Carotid endarterectomy  2011    right carotid  . Port-a-cath removal    . Modified radical mastectomy  Feb 2012    Right breast  . Carotid stent      left  . Cardiac catheterization  06/2013    old total occ. of RCA, moderately severe ostial LCX stenosis- medical therapy- would be complex PCI  . Carotid angiogram N/A 03/01/2011    Procedure: CAROTID ANGIOGRAM;  Surgeon: BConrad Panaca MD;  [facility];  Service: Cardiovascular;  Laterality: N/A;  . Arch aortogram  03/01/2011    Procedure: ARCH AORTOGRAM;  Surgeon: BConrad [place] MD;  [facility];  Service: Cardiovascular;;  .  Carotid stent insertion Left 03/15/2011    Procedure: CAROTID STENT INSERTION;  Surgeon: Serafina Mitchell, MD;  Location: [facility] CATH LAB;  Service: Cardiovascular;  Laterality: Left;  . Left heart catheterization with coronary angiogram N/A 07/07/2013    Procedure: LEFT HEART CATHETERIZATION WITH CORONARY ANGIOGRAM;  Surgeon: Blane Ohara, MD;  Location: [facility] CATH LAB;  Service: Cardiovascular;  Laterality: N/A;  . Carotid dopplers  05/2014    Patent ICAs, bilat ECA dz: no change compared to 2014  . Transthoracic echocardiogram  06/2013    EF 55-60%, grd I diast dysfxn, mod mitral regurg   Family History  Problem Relation Age of  Onset  . Arthritis    . Coronary artery disease      first degree relative  . Stroke      first degree relative  . Diabetes Mother   . Heart disease Mother   . Hypertension Mother   . Cancer Mother   . Deep vein thrombosis Mother   . Colon cancer Neg Hx   . Stomach cancer Neg Hx   . Cancer Father     Pancreatic cancer  . Breast cancer Paternal Aunt 61   Social History  Substance Use Topics  . Smoking status: Former Smoker -- 1.00 packs/day for 20 years    Types: Cigarettes    Quit date: 01/31/1988  . Smokeless tobacco: Never Used  . Alcohol Use: No   OB History    No data available     Review of Systems  Constitutional: Positive for chills and fatigue. Negative for fever.  HENT: Positive for congestion. Negative for sore throat.   Eyes: Negative for redness and visual disturbance.  Respiratory: Positive for cough and shortness of breath.   Cardiovascular: Negative for chest pain.  Gastrointestinal: Negative for nausea, vomiting and abdominal pain.  Genitourinary: Negative for dysuria.  Musculoskeletal: Positive for neck pain. Negative for back pain.  Skin: Negative for rash.  Allergic/Immunologic: Positive for immunocompromised state.  Neurological: Positive for headaches.  Hematological: Does not bruise/bleed easily.  Psychiatric/Behavioral: Negative for confusion.      Allergies  Chlorhexidine; Adhesive; and Codeine  Home Medications   Prior to Admission medications   Medication Sig Start Date End Date Taking? Authorizing Provider  ACCU-CHEK AVIVA PLUS test strip CHECK BLOOD SUGAR TWICE A DAY FOR DX:250.00 08/10/14   [name], MD  aspirin 81 MG chewable tablet Chew 81 mg by mouth daily.    Historical Provider, MD  atorvastatin (LIPITOR) 80 MG tablet Take 1 tablet (80 mg total) by mouth daily. 07/20/14   Peter M Martinique, MD  Blood Glucose Monitoring Suppl (ACCU-CHEK NANO SMARTVIEW) W/DEVICE KIT Use to check blood sugar twice a day. DX 250.00 03/28/13    Mosie Lukes, MD  calcium carbonate (TUMS) 500 MG chewable tablet Chew 1 tablet (200 mg of elemental calcium total) by mouth 3 (three) times daily with meals. 08/02/14   [name], MD  carvedilol (COREG) 25 MG tablet Take 1 tablet (25 mg total) by mouth 2 (two) times daily with a meal. Patient taking differently: Take 25 mg by mouth 2 (two) times daily with a meal. Pt taking 1/2 of carvediol tablet twice daily. 11/23/14   Peter M Martinique, MD  clopidogrel (PLAVIX) 75 MG tablet Take 1 tablet (75 mg total) by mouth daily. 10/23/14   Serafina Mitchell, MD  cyanocobalamin (,VITAMIN B-12,) 1000 MCG/ML injection Inject 1,000 mcg into the muscle every 30 (thirty) days.  Historical Provider, MD  darbepoetin (ARANESP) 200 MCG/0.4ML SOLN Inject 300 mcg into the skin every 14 (fourteen) days.    Historical Provider, MD  furosemide (LASIX) 40 MG tablet Take 1 tablet (40 mg total) by mouth 2 (two) times daily. 1 tab po bid 10/20/14   Chauncey Cruel, MD  gabapentin (NEURONTIN) 100 MG capsule Take 1 capsule (100 mg total) by mouth 3 (three) times daily. 12/31/14   Chauncey Cruel, MD  glycerin adult (GLYCERIN ADULT) 2 G SUPP Place 1 suppository rectally once as needed (constipation). 04/13/12   Julianne Rice, MD  insulin detemir (LEVEMIR) 100 UNIT/ML injection Inject 0.15 mLs (15 Units total) into the skin at bedtime. 10/14/14   [name], MD  insulin lispro (HUMALOG KWIKPEN) 100 UNIT/ML KiwkPen 2 U SQ qAM, 3 U SQ qLUNCH, and 3 U SQ qSupper 10/14/14   [name], MD  levothyroxine (SYNTHROID, LEVOTHROID) 150 MCG tablet 1 tab po qd except take 1 and 1/2 tabs on Mondays and Fridays 01/18/15   [name], MD  lidocaine-prilocaine (EMLA) cream Apply to affected area once 02/12/15   Chauncey Cruel, MD  mirtazapine (REMERON) 15 MG tablet Take 1 tablet (15 mg total) by mouth at bedtime. 01/18/15   Chauncey Cruel, MD  morphine (MS CONTIN) 30 MG 12 hr tablet Take 1 tablet (30 mg total) by mouth 3  (three) times daily. 02/12/15   Chauncey Cruel, MD  morphine (MSIR) 15 MG tablet Take 1 tablet (15 mg total) by mouth every 4 (four) hours as needed for severe pain. 02/12/15   Chauncey Cruel, MD  nitroGLYCERIN (NITROSTAT) 0.4 MG SL tablet Place 1 tablet (0.4 mg total) under the tongue every 5 (five) minutes as needed for chest pain. 07/08/13   Janece Canterbury, MD  omeprazole (PRILOSEC) 40 MG capsule Take 1 capsule (40 mg total) by mouth daily. 03/02/14   Mosie Lukes, MD  polyethylene glycol (MIRALAX / GLYCOLAX) packet Take 17 g by mouth daily as needed for mild constipation. Reported on 02/17/2015    Historical Provider, MD  potassium chloride SA (K-DUR,KLOR-CON) 20 MEQ tablet Take 2 tablets (40 mEq total) by mouth daily. 10/23/14   Chauncey Cruel, MD  senna-docusate (SENOKOT-S) 8.6-50 MG per tablet Take 1 tablet by mouth at bedtime.     Historical Provider, MD   BP 111/61 mmHg  Pulse 66  Temp(Src) 100.8 F (38.2 C) (Rectal)  Resp 20  Ht 5' 3" (1.6 m)  Wt 72.122 kg  BMI 28.17 kg/m2  SpO2 95%  LMP 03/13/2012 Physical Exam  Constitutional: She is oriented to person, place, and time. No distress.  HENT:  Head: Normocephalic and atraumatic.  Mucous membranes dry  Eyes: Conjunctivae and EOM are normal.  Neck: Normal range of motion. Neck supple.  Cardiovascular: Regular rhythm.   Tachycardic  Pulmonary/Chest: Breath sounds normal. She is in respiratory distress. She has no wheezes. She has no rales.  Chest Port-A-Cath  Abdominal: Soft. Bowel sounds are normal. There is no tenderness.  Musculoskeletal: Normal range of motion. She exhibits no edema.  Neurological: She is alert and oriented to person, place, and time. No cranial nerve deficit. She exhibits normal muscle tone. Coordination normal.  Skin: Skin is warm. No rash noted. She is not diaphoretic.    ED Course  Procedures (including critical care time) Labs Review Labs Reviewed  COMPREHENSIVE METABOLIC PANEL - Abnormal;  Notable for the following:    Sodium 132 (*)  Chloride 95 (*)    CO2 21 (*)    Glucose, Bld 193 (*)    BUN 45 (*)    Creatinine, Ser 1.48 (*)    Calcium 7.6 (*)    Total Protein 6.3 (*)    Albumin 2.9 (*)    ALT 11 (*)    Total Bilirubin 1.4 (*)    GFR calc non Af Amer 34 (*)    GFR calc Af Amer 40 (*)    Anion gap 16 (*)    All other components within normal limits  CBC WITH DIFFERENTIAL/PLATELET - Abnormal; Notable for the following:    RBC 3.12 (*)    Hemoglobin 8.9 (*)    HCT 29.0 (*)    RDW 15.8 (*)    nRBC 1 (*)    All other components within normal limits  I-STAT CG4 LACTIC ACID, ED - Abnormal; Notable for the following:    Lactic Acid, Venous 4.17 (*)    All other components within normal limits  CULTURE, BLOOD (ROUTINE X 2)  CULTURE, BLOOD (ROUTINE X 2)  URINE CULTURE  URINALYSIS, ROUTINE W REFLEX MICROSCOPIC (NOT AT [facility])  I-STAT CG4 LACTIC ACID, ED   Results for orders placed or performed during the hospital encounter of 03/02/15  Comprehensive metabolic panel  Result Value Ref Range   Sodium 132 (L) 135 - 145 mmol/L   Potassium 4.0 3.5 - 5.1 mmol/L   Chloride 95 (L) 101 - 111 mmol/L   CO2 21 (L) 22 - 32 mmol/L   Glucose, Bld 193 (H) 65 - 99 mg/dL   BUN 45 (H) 6 - 20 mg/dL   Creatinine, Ser 1.48 (H) 0.44 - 1.00 mg/dL   Calcium 7.6 (L) 8.9 - 10.3 mg/dL   Total Protein 6.3 (L) 6.5 - 8.1 g/dL   Albumin 2.9 (L) 3.5 - 5.0 g/dL   AST 28 15 - 41 U/L   ALT 11 (L) 14 - 54 U/L   Alkaline Phosphatase 94 38 - 126 U/L   Total Bilirubin 1.4 (H) 0.3 - 1.2 mg/dL   GFR calc non Af Amer 34 (L) >60 mL/min   GFR calc Af Amer 40 (L) >60 mL/min   Anion gap 16 (H) 5 - 15  CBC with Differential  Result Value Ref Range   WBC 6.0 4.0 - 10.5 K/uL   RBC 3.12 (L) 3.87 - 5.11 MIL/uL   Hemoglobin 8.9 (L) 12.0 - 15.0 g/dL   HCT 29.0 (L) 36.0 - 46.0 %   MCV 92.9 78.0 - 100.0 fL   MCH 28.5 26.0 - 34.0 pg   MCHC 30.7 30.0 - 36.0 g/dL   RDW 15.8 (H) 11.5 - 15.5 %   Platelets  192 150 - 400 K/uL   Neutrophils Relative % 56 %   Lymphocytes Relative 23 %   Monocytes Relative 12 %   Eosinophils Relative 0 %   Basophils Relative 0 %   Band Neutrophils 7 %   Metamyelocytes Relative 1 %   Promyelocytes Absolute 1 %   nRBC 1 (H) 0 /100 WBC   Neutro Abs 3.9 1.7 - 7.7 K/uL   Lymphs Abs 1.4 0.7 - 4.0 K/uL   Monocytes Absolute 0.7 0.1 - 1.0 K/uL   Eosinophils Absolute 0.0 0.0 - 0.7 K/uL   Basophils Absolute 0.0 0.0 - 0.1 K/uL   WBC Morphology DOHLE BODIES    Smear Review LARGE PLATELETS PRESENT   I-Stat CG4 Lactic Acid, ED  (not at [facility])  Result  Value Ref Range   Lactic Acid, Venous 4.17 (HH) 0.5 - 2.0 mmol/L   Comment NOTIFIED PHYSICIAN    *Note: Due to a large number of results and/or encounters for the requested time period, some results have not been displayed. A complete set of results can be found in Results Review.    Imaging Review Dg Chest 2 View  03/02/2015  CLINICAL DATA:  Shortness of breath, cough and congestion since Sunday, history coronary artery disease, chronic renal insufficiency, coronary artery disease, hypertension, type II diabetes mellitus, metastatic breast cancer EXAM: CHEST  2 VIEW COMPARISON:  09/21/2014 FINDINGS: RIGHT subclavian Port-A-Cath with tip projecting over SVC. Normal heart size, mediastinal contours, and pulmonary vascularity. Atherosclerotic calcification aorta. Eventration of the anterior RIGHT diaphragm. Increased markings at anterior RIGHT lung base question chronic atelectasis. Remaining lungs grossly clear. No pleural effusion or pneumothorax. Diffuse osteosclerosis again identified, patchy, likely related to known osseous metastatic disease. IMPRESSION: Chronic RIGHT basilar atelectasis. No definite acute pulmonary abnormalities. Diffuse osseous metastatic disease. Electronically Signed   By: Lavonia Dana M.D.   On: 03/02/2015 12:33   I have personally reviewed and evaluated these images and lab results as part of my medical  decision-making.   EKG Interpretation   Date/Time:  Tuesday March 02 2015 11:35:10 EST Ventricular Rate:  115 PR Interval:  180 QRS Duration: 92 QT Interval:  340 QTC Calculation: 470 R Axis:   82 Text Interpretation:  Sinus tachycardia with irregular rate Borderline  right axis deviation Borderline repolarization abnormality Confirmed by  Kalen Neidert  MD, Tristian Bouska ([id number]) on 03/02/2015 11:43:41 AM       CRITICAL CARE Performed by: Fredia Sorrow Total critical care time: 30 minutes Critical care time was exclusive of separately billable procedures and treating other patients. Critical care was necessary to treat or prevent imminent or life-threatening deterioration. Critical care was time spent personally by me on the following activities: development of treatment plan with patient and/or surrogate as well as nursing, discussions with consultants, evaluation of patient's response to treatment, examination of patient, obtaining history from patient or surrogate, ordering and performing treatments and interventions, ordering and review of laboratory studies, ordering and review of radiographic studies, pulse oximetry and re-evaluation of patient's condition.    MDM   Final diagnoses:  Sepsis, due to unspecified organism [facility])  Breast cancer metastasized to bone, unspecified laterality [facility])   Patient presenting with SIRS criteria suggestive of sepsis. Patient's temp is never been greater than 100.8 but it is 100.8. But she was tachycardic heart rate as high as 154. Also elevated respiratory rate. As high as 32. Therefore patient was started on the sepsis protocol. Her fluid target was 2.5 L of normal saline. Patient also cultured up and started on broad-spectrum antibiotics. Patient's symptoms sounded as if there could be respiratory infection. Patient did have the flu shot. She is immunocompromised does have metastatic breast cancer and is undergoing chemotherapy currently. Patient's  main complaints was cough congestion bodyaches headache which is really the left lateral neck shooting up into the head. And chills as been present for 2 days. Patient has had pneumonia in the past with admission in July for this.   Workup here chest x-rays negative for evidence of pneumonia. Patient's heart rate is slowly improved proved with fluids. Patient's never been hypotensive low blood pressures been 96/50. Currently it's 111/61. Hemoglobin 8.9 diffuse electrolyte abnormalities but nothing severe. Patient's lactic acid was elevated in the 4 range.  Patient will require admission mission would be  more appropriate [facility] since she is undergoing chemotherapy and followed by hematology oncology there.  The patient will be admitted to step down unit at [facility] long. We'll send off flu screen.    Fredia Sorrow, MD 03/02/15 1339

## 2015-03-02 NOTE — ED Notes (Signed)
Called and tried to give  Report. On hold x 5-10 min. And no one ever came to phone.

## 2015-03-02 NOTE — Progress Notes (Signed)
OFFICE VISIT  03/02/2015   CC:  Chief Complaint  Patient presents with  . Follow-up    orthostatic hypotension     HPI:    Patient is a 75 y.o. Caucasian female who presents for 1 wk f/u orthostatic dizziness. Last visit we stopped her lasix and cut her coreg in half from 25 bid down to 12.5 bid.  This dizziness had cleared until this morning. Two days ago she had onset of nasal congest/runny nose, ST, cough, fatigue.  No n/v, no fevers, denies SOB. No cough med tried.    Most recent chemo was 6 d/a, had mild neutropenia per pt report, got neulasta and this caused diffuse body aches but these abated after a few days.  Past Medical History  Diagnosis Date  . 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 Anxiety and depression   . GERD (gastroesophageal reflux disease)   . Hyperlipidemia   . Hypothyroidism   . Osteoarthritis   . Carotid artery stenosis     bilateral  . CAD (coronary artery disease)     medical therapy, old tot RCA, patent LAD, mod. severe ostial LCX stenosis--PCI would be too complex-med mgmt recommended.  . OSA (obstructive sleep apnea)   . Hypertension   . Carotid artery occlusion   . Unspecified constipation 03/15/2012  . Bursitis   . Collagen vascular disease ([place])   . History of radiation therapy 10/31/12-11/13/12    rt&lt humerous 30Gy/50f  . Valvular heart disease 07/11/2013  . Diarrhea 09/28/2013  . Chronic renal insufficiency, stage II (mild)     Borderline stage II/III, CrCl 50s/60s.  . History of blood transfusion   . Urinary tract bacterial infections   . 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 There was bone only involvement. New liver lesions 12/2014 (path confirmed mets): pt to start chemo (eribucin) treatments to start on February 17, 2015. She will receive treatment on the first and eighth day of each 21 day cycle. The plan is to go to 3 or 4 cycles before  restaging  . Diabetes mellitus type II     insulin  . Hypocalcemia 07/2014    required IV calcium in ED once and was admitted to hosp once.  ? [facility]?  . Drug-induced pneumonitis 08/2014    (Everolimus) CXR 09/21/14 improved; needs another CXR 2-3 wks later to make sure resolution occurs.    Past Surgical History  Procedure Laterality Date  . Incisional breast biopsy      remoted left breast biopsy  . Cesarean section    . Other surgical history      GYN surgery  . Knee surgery      right knee surgery  . Carotid endarterectomy  2011    right carotid  . Port-a-cath removal    . Modified radical mastectomy  Feb 2012    Right breast  . Carotid stent      left  . Cardiac catheterization  06/2013    old total occ. of RCA, moderately severe ostial LCX stenosis- medical therapy- would be complex PCI  . Carotid angiogram N/A 03/01/2011    Procedure: CAROTID ANGIOGRAM;  Surgeon: BConrad Ellerbe MD;  [facility];  Service: Cardiovascular;  Laterality: N/A;  . Arch aortogram  03/01/2011    Procedure: ARCH AORTOGRAM;  Surgeon: BConrad Dean MD;  [facility];  Service: Cardiovascular;;  . Carotid stent insertion Left 03/15/2011    Procedure: CAROTID STENT INSERTION;  Surgeon: Serafina Mitchell, MD;  Location[facility] CATH LAB;  Service: Cardiovascular;  Laterality: Left;  . Left heart catheterization with coronary angiogram N/A 07/07/2013    Procedure: LEFT HEART CATHETERIZATION WITH CORONARY ANGIOGRAM;  Surgeon: Blane Ohara, MD;  Location: [facility] CATH LAB;  Service: Cardiovascular;  Laterality: N/A;  . Carotid dopplers  05/2014    Patent ICAs, bilat ECA dz: no change compared to 2014  . Transthoracic echocardiogram  06/2013    EF 55-60%, grd I diast dysfxn, mod mitral regurg    Outpatient Prescriptions Prior to Visit  Medication Sig Dispense Refill  . ACCU-CHEK AVIVA PLUS test strip CHECK BLOOD SUGAR TWICE A DAY FOR DX:250.00 100 each 11  . aspirin 81 MG  chewable tablet Chew 81 mg by mouth daily.    Marland Kitchen atorvastatin (LIPITOR) 80 MG tablet Take 1 tablet (80 mg total) by mouth daily. 30 tablet 11  . Blood Glucose Monitoring Suppl (ACCU-CHEK NANO SMARTVIEW) W/DEVICE KIT Use to check blood sugar twice a day. DX 250.00 1 kit 0  . calcium carbonate (TUMS) 500 MG chewable tablet Chew 1 tablet (200 mg of elemental calcium total) by mouth 3 (three) times daily with meals. 30 tablet 0  . carvedilol (COREG) 25 MG tablet Take 1 tablet (25 mg total) by mouth 2 (two) times daily with a meal. (Patient taking differently: Take 25 mg by mouth 2 (two) times daily with a meal. Pt taking 1/2 of carvediol tablet twice daily.) 60 tablet 8  . clopidogrel (PLAVIX) 75 MG tablet Take 1 tablet (75 mg total) by mouth daily. 30 tablet 8  . cyanocobalamin (,VITAMIN B-12,) 1000 MCG/ML injection Inject 1,000 mcg into the muscle every 30 (thirty) days.     . darbepoetin (ARANESP) 200 MCG/0.4ML SOLN Inject 300 mcg into the skin every 14 (fourteen) days.    . furosemide (LASIX) 40 MG tablet Take 1 tablet (40 mg total) by mouth 2 (two) times daily. 1 tab po bid 60 tablet 6  . gabapentin (NEURONTIN) 100 MG capsule Take 1 capsule (100 mg total) by mouth 3 (three) times daily. 90 capsule 2  . glycerin adult (GLYCERIN ADULT) 2 G SUPP Place 1 suppository rectally once as needed (constipation). 10 suppository 0  . insulin detemir (LEVEMIR) 100 UNIT/ML injection Inject 0.15 mLs (15 Units total) into the skin at bedtime. 10 mL 0  . insulin lispro (HUMALOG KWIKPEN) 100 UNIT/ML KiwkPen 2 U SQ qAM, 3 U SQ qLUNCH, and 3 U SQ qSupper 15 mL 11  . levothyroxine (SYNTHROID, LEVOTHROID) 150 MCG tablet 1 tab po qd except take 1 and 1/2 tabs on Mondays and Fridays 34 tablet 1  . lidocaine-prilocaine (EMLA) cream Apply to affected area once 30 g 3  . mirtazapine (REMERON) 15 MG tablet Take 1 tablet (15 mg total) by mouth at bedtime. 30 tablet 3  . morphine (MS CONTIN) 30 MG 12 hr tablet Take 1 tablet (30 mg  total) by mouth 3 (three) times daily. 90 tablet 0  . morphine (MSIR) 15 MG tablet Take 1 tablet (15 mg total) by mouth every 4 (four) hours as needed for severe pain. 60 tablet 0  . nitroGLYCERIN (NITROSTAT) 0.4 MG SL tablet Place 1 tablet (0.4 mg total) under the tongue every 5 (five) minutes as needed for chest pain. 30 tablet 0  . omeprazole (PRILOSEC) 40 MG capsule Take 1  capsule (40 mg total) by mouth daily. 30 capsule 3  . polyethylene glycol (MIRALAX / GLYCOLAX) packet Take 17 g by mouth daily as needed for mild constipation. Reported on 02/17/2015    . potassium chloride SA (K-DUR,KLOR-CON) 20 MEQ tablet Take 2 tablets (40 mEq total) by mouth daily. 60 tablet 3  . senna-docusate (SENOKOT-S) 8.6-50 MG per tablet Take 1 tablet by mouth at bedtime.      No facility-administered medications prior to visit.    Allergies  Allergen Reactions  . Chlorhexidine Hives, Itching and Rash    This was most likely a CONTACT DERMATITIS versus true systemic allergic reaction  . Adhesive [Tape] Hives  . Codeine Swelling    ROS As per HPI  PE: Blood pressure 110/52, pulse 121, temperature 98.1 F (36.7 C), temperature source Oral, resp. rate 16, height '5\' 2"'$  (1.575 m), weight 159 lb 8 oz (72.349 kg), last menstrual period 03/13/2012, SpO2 95 %. Gen: tired appearing, NAD, lucid thought and speech. SKIN: pale ENT: Ears: EACs clear, normal epithelium.  TMs with good light reflex and landmarks bilaterally.  Eyes: no injection, icteris, swelling, or exudate.  EOMI, PERRLA. Nose: no drainage or turbinate edema/swelling.  No injection or focal lesion.  Mouth: lips without lesion/swelling.  Oral mucosa pink and moist.  Dentition intact and without obvious caries or gingival swelling.  Oropharynx without erythema, exudate, or swelling.  CV: Regular, tachy to 130s, no murmur or rub LUNGS: CTA bilat except for a hint of insp crackles in L base.  Tachypnea to the 50s. ABD: soft/NT EXT: 1+ pitting in LL's,  L>R.   LABS:  Lab Results  Component Value Date   WBC 2.4* 02/24/2015   HGB 9.6* 02/24/2015   HCT 30.2* 02/24/2015   MCV 91.8 02/24/2015   PLT 208 02/24/2015     Chemistry      Component Value Date/Time   NA 132* 02/24/2015 1031   NA 143 02/05/2015 0846   K 3.9 02/24/2015 1031   K 3.9 02/05/2015 0846   CL 100* 02/05/2015 0846   CL 104 07/08/2012 1023   CO2 25 02/24/2015 1031   CO2 25 02/05/2015 0846   BUN 63.1* 02/24/2015 1031   BUN 43* 02/05/2015 0846   CREATININE 1.7* 02/24/2015 1031   CREATININE 0.97 02/05/2015 0846   CREATININE 1.37* 12/21/2014 0810      Component Value Date/Time   CALCIUM 9.1 02/24/2015 1031   CALCIUM 8.9 02/05/2015 0846   ALKPHOS 145 02/24/2015 1031   ALKPHOS 124 02/05/2015 0846   AST 26 02/24/2015 1031   AST 28 02/05/2015 0846   ALT 10 02/24/2015 1031   ALT 13* 02/05/2015 0846   BILITOT 0.72 02/24/2015 1031   BILITOT 1.2 02/05/2015 0846      IMPRESSION AND PLAN:  Acute respiratory illness in neutropenic patient who appears significantly ill in my office today. I recommend pt go to ED for further, more urgent, eval/mgmt. I assisted pt and her sister to get pt to her car so they could go from office to med center [facility] today.  An After Visit Summary was printed and given to the patient.  Spent 25 min with pt today, with >50% of this time spent in counseling and care coordination regarding the above problems.  FOLLOW UP: To be determined based on results of pending workup.

## 2015-03-02 NOTE — H&P (Signed)
History and Physical:    Brittney Cunningham   TKW:[id number] DOB: 02/01/1940 DOA: 03/02/2015  Referring MD/provider: Dr. Rogene Houston PCP: [name], MD   Chief Complaint: Upper respiratory symptoms, weakness  History of Present Illness:   Brittney Cunningham is an 75 y.o. female with a PMH of stage IV right breast cancer diagnosed May/2011 with metastasis to the bone and liver status post neoadjuvant therapy, right modified radical mastectomy 03/2010 and radiation treatment, under the care of Dr. [name] who is currently treating her for progressive disease with eribulin with last dose given 02/24/15 followed by Neulasta support, who presented to her PCPs office earlier today with a one-week history of orthostatic dizziness. Dr. Anitra Lauth stopped her Lasix and reduced her Coreg dose 1 week prior which initially helped her symptoms, but they returned today. 2 days ago, the patient reports the onset of nasal congestion, rhinorrhea, cough, and worsening fatigue. She also had some headache and left-sided neck pain. She denies any associated fevers or shortness of breath. She has had myalgias, but this is not unusual after Neulasta. She has received a flu vaccination. She has not used any over-the-counter cough medications or other treatments. Her PCP recommended she be seen at [facility], and upon evaluation there, she was discovered to be tachycardic with a fever of 100.8. WBC was normal, but she was recently neutropenic.   ROS:   Review of Systems  Constitutional: Positive for chills and malaise/fatigue. Negative for fever.  HENT: Positive for congestion.   Respiratory: Positive for cough. Negative for shortness of breath.   Cardiovascular: Negative.   Gastrointestinal: Negative for nausea and vomiting.  Genitourinary: Negative.   Musculoskeletal: Positive for myalgias.  Neurological: Positive for dizziness, weakness and headaches.  Endo/Heme/Allergies: Bruises/bleeds easily.    Psychiatric/Behavioral: Positive for depression. The patient is nervous/anxious.      Past Medical History:   Past Medical History  Diagnosis Date  . 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 Anxiety and depression   . GERD (gastroesophageal reflux disease)   . Hyperlipidemia   . Hypothyroidism   . Osteoarthritis   . Carotid artery stenosis     bilateral  . CAD (coronary artery disease)     medical therapy, old tot RCA, patent LAD, mod. severe ostial LCX stenosis--PCI would be too complex-med mgmt recommended.  . OSA (obstructive sleep apnea)   . Hypertension   . Carotid artery occlusion   . Unspecified constipation 03/15/2012  . Bursitis   . Collagen vascular disease ([place])   . History of radiation therapy 10/31/12-11/13/12    rt&lt humerous 30Gy/53f  . Valvular heart disease 07/11/2013  . Diarrhea 09/28/2013  . Chronic renal insufficiency, stage II (mild)     Borderline stage II/III, CrCl 50s/60s.  . History of blood transfusion   . Urinary tract bacterial infections   . [place]    Grade 2 invasive ductal carcinoma, T2 NXM1, stage IV at presentation. There was bone only involvement. New liver lesions 12/2014 (path confirmed mets): pt to start chemo (eribucin) treatments to start on February 17, 2015. She will receive treatment on the first and eighth day of each 21 day cycle. The plan is to go to 3 or 4 cycles before restaging  . Diabetes mellitus type II     insulin  . Hypocalcemia 07/2014    required IV calcium in ED once and was admitted to hosp once.  ? secondary to  XGeva and everolimus?  . Drug-induced pneumonitis 08/2014    (Everolimus) CXR 09/21/14 improved; needs another CXR 2-3 wks later to make sure resolution occurs.    Past Surgical History:   Past Surgical History  Procedure Laterality Date  . Incisional breast biopsy      remoted left breast biopsy  . Cesarean section    . Other surgical history      GYN surgery   . Knee surgery      right knee surgery  . Carotid endarterectomy  2011    right carotid  . Port-a-cath removal    . Modified radical mastectomy  Feb 2012    Right breast  . Carotid stent      left  . Cardiac catheterization  06/2013    old total occ. of RCA, moderately severe ostial LCX stenosis- medical therapy- would be complex PCI  . Carotid angiogram N/A 03/01/2011    Procedure: CAROTID ANGIOGRAM;  Surgeon: Conrad Nitro, MD;  Location: [facility] CATH LAB;  Service: Cardiovascular;  Laterality: N/A;  . Arch aortogram  03/01/2011    Procedure: ARCH AORTOGRAM;  Surgeon: Conrad [place]boro, MD;  Location: [facility] CATH LAB;  Service: Cardiovascular;;  . Carotid stent insertion Left 03/15/2011    Procedure: CAROTID STENT INSERTION;  Surgeon: Serafina Mitchell, MD;  Location: [facility] CATH LAB;  Service: Cardiovascular;  Laterality: Left;  . Left heart catheterization with coronary angiogram N/A 07/07/2013    Procedure: LEFT HEART CATHETERIZATION WITH CORONARY ANGIOGRAM;  Surgeon: Blane Ohara, MD;  Location: [facility] CATH LAB;  Service: Cardiovascular;  Laterality: N/A;  . Carotid dopplers  05/2014    Patent ICAs, bilat ECA dz: no change compared to 2014  . Transthoracic echocardiogram  06/2013    EF 55-60%, grd I diast dysfxn, mod mitral regurg    Social History:   Social History   Social History  . Marital Status: Married    Spouse Name: N/A  . Number of Children: 3  . Years of Education: N/A   Occupational History  . 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 Smoking status: Former Smoker -- 1.00 packs/day for 20 years    Types: Cigarettes    Quit date: 01/31/1988  . Smokeless tobacco: Never Used  . Alcohol Use: No  . Drug Use: No  . Sexual Activity: Not Currently    Birth Control/ Protection: Post-menopausal   Other Topics Concern  . Not on file   Social History Narrative   Retired-ran a daycare facility for 40 years.   Married- 76 years    2 sons    1 daughter - Marine scientist  midwife   Former smoker-quit in [place].     Family history:   Family History  Problem Relation Age of Onset  . Arthritis    . Coronary artery disease      first degree relative  . Stroke      first degree relative  . Diabetes Mother   . Heart disease Mother   . Hypertension Mother   . Cancer Mother   . Deep vein thrombosis Mother   . Colon cancer Neg Hx   . Stomach cancer Neg Hx   . Cancer Father     Pancreatic cancer  . Breast cancer Paternal Aunt 40    Allergies   Chlorhexidine; Adhesive; and Codeine  Current Medications:   Prior to Admission medications   Medication Sig Start Date End Date  Taking? Authorizing Provider  ACCU-CHEK AVIVA PLUS test strip CHECK BLOOD SUGAR TWICE A DAY FOR DX:250.00 08/10/14   Jeoffrey Massed, MD  aspirin 81 MG chewable tablet Chew 81 mg by mouth daily.    Historical Provider, MD  atorvastatin (LIPITOR) 80 MG tablet Take 1 tablet (80 mg total) by mouth daily. 07/20/14   Peter M [place], MD  Blood Glucose Monitoring Suppl (ACCU-CHEK NANO SMARTVIEW) W/DEVICE KIT Use to check blood sugar twice a day. DX 250.00 03/28/13   Bradd Canary, MD  calcium carbonate (TUMS) 500 MG chewable tablet Chew 1 tablet (200 mg of elemental calcium total) by mouth 3 (three) times daily with meals. 08/02/14   Geoffery Lyons, MD  carvedilol (COREG) 25 MG tablet Take 1 tablet (25 mg total) by mouth 2 (two) times daily with a meal. Patient taking differently: Take 25 mg by mouth 2 (two) times daily with a meal. Pt taking 1/2 of carvediol tablet twice daily. 11/23/14   Peter M [place], MD  clopidogrel (PLAVIX) 75 MG tablet Take 1 tablet (75 mg total) by mouth daily. 10/23/14   Nada Libman, MD  cyanocobalamin (,VITAMIN B-12,) 1000 MCG/ML injection Inject 1,000 mcg into the muscle every 30 (thirty) days.     Historical Provider, MD  darbepoetin (ARANESP) 200 MCG/0.4ML SOLN Inject 300 mcg into the skin every 14 (fourteen) days.    Historical Provider, MD  furosemide (LASIX) 40 MG  tablet Take 1 tablet (40 mg total) by mouth 2 (two) times daily. 1 tab po bid 10/20/14   Lowella Dell, MD  gabapentin (NEURONTIN) 100 MG capsule Take 1 capsule (100 mg total) by mouth 3 (three) times daily. 12/31/14   Lowella Dell, MD  glycerin adult (GLYCERIN ADULT) 2 G SUPP Place 1 suppository rectally once as needed (constipation). 04/13/12   Loren Racer, MD  insulin detemir (LEVEMIR) 100 UNIT/ML injection Inject 0.15 mLs (15 Units total) into the skin at bedtime. 10/14/14   Jeoffrey Massed, MD  insulin lispro (HUMALOG KWIKPEN) 100 UNIT/ML KiwkPen 2 U SQ qAM, 3 U SQ qLUNCH, and 3 U SQ qSupper 10/14/14   Jeoffrey Massed, MD  levothyroxine (SYNTHROID, LEVOTHROID) 150 MCG tablet 1 tab po qd except take 1 and 1/2 tabs on Mondays and Fridays 01/18/15   Jeoffrey Massed, MD  lidocaine-prilocaine (EMLA) cream Apply to affected area once 02/12/15   Lowella Dell, MD  mirtazapine (REMERON) 15 MG tablet Take 1 tablet (15 mg total) by mouth at bedtime. 01/18/15   Lowella Dell, MD  morphine (MS CONTIN) 30 MG 12 hr tablet Take 1 tablet (30 mg total) by mouth 3 (three) times daily. 02/12/15   Lowella Dell, MD  morphine (MSIR) 15 MG tablet Take 1 tablet (15 mg total) by mouth every 4 (four) hours as needed for severe pain. 02/12/15   Lowella Dell, MD  nitroGLYCERIN (NITROSTAT) 0.4 MG SL tablet Place 1 tablet (0.4 mg total) under the tongue every 5 (five) minutes as needed for chest pain. 07/08/13   Renae Fickle, MD  omeprazole (PRILOSEC) 40 MG capsule Take 1 capsule (40 mg total) by mouth daily. 03/02/14   Bradd Canary, MD  polyethylene glycol (MIRALAX / GLYCOLAX) packet Take 17 g by mouth daily as needed for mild constipation. Reported on 02/17/2015    Historical Provider, MD  potassium chloride SA (K-DUR,KLOR-CON) 20 MEQ tablet Take 2 tablets (40 mEq total) by mouth daily. 10/23/14   Lowella Dell, MD  senna-docusate (SENOKOT-S) 8.6-50 MG per tablet Take 1 tablet by mouth at  bedtime.     Historical Provider, MD    Physical Exam:   Filed Vitals:   03/02/15 1159 03/02/15 1230 03/02/15 1300 03/02/15 1303  BP: 108/62 106/47 96/50 111/61  Pulse: 136 154 84 66  Temp:      TempSrc:      Resp: 32 '31 17 20  '$ Height:      Weight:      SpO2: 98% 94% 96% 95%     Physical Exam: Blood pressure 111/61, pulse 66, temperature 100.8 F (38.2 C), temperature source Rectal, resp. rate 20, height '5\' 3"'$  (1.6 m), weight 72.122 kg (159 lb), last menstrual period 03/13/2012, SpO2 95 %. Gen: Frail female, ill appearing. Head: Normocephalic, atraumatic. Eyes: PERRL, EOMI, sclerae nonicteric. Mouth: Oropharynx clear.  No thrush. Neck: Supple, no thyromegaly, no lymphadenopathy, no jugular venous distention. Chest: Lungs diminished. No wheezes or rales.  Right sided porta-cath without signs of infection. CV: Heart sounds are regular.  No murmurs, rubs, or gallops. Abdomen: Soft, nontender, nondistended with normal active bowel sounds. Extremities: Extremities are with trace edema bilaterally. Skin: Warm and dry. Neuro: Mildly lethargic; grossly nonfocal. Psych: Mood and affect normal.   Data Review:    Labs: Basic Metabolic Panel:  Recent Labs Lab 02/24/15 1031 03/02/15 1120  NA 132* 132*  K 3.9 4.0  CL  --  95*  CO2 25 21*  GLUCOSE 204* 193*  BUN 63.1* 45*  CREATININE 1.7* 1.48*  CALCIUM 9.1 7.6*   Liver Function Tests:  Recent Labs Lab 02/24/15 1031 03/02/15 1120  AST 26 28  ALT 10 11*  ALKPHOS 145 94  BILITOT 0.72 1.4*  PROT 6.8 6.3*  ALBUMIN 3.1* 2.9*   No results for input(s): LIPASE, AMYLASE in the last 168 hours. No results for input(s): AMMONIA in the last 168 hours. CBC:  Recent Labs Lab 02/24/15 1031 03/02/15 1120  WBC 2.4* 6.0  NEUTROABS 0.9* 3.9  HGB 9.6* 8.9*  HCT 30.2* 29.0*  MCV 91.8 92.9  PLT 208 192   Cardiac Enzymes: No results for input(s): CKTOTAL, CKMB, CKMBINDEX, TROPONINI in the last 168 hours.  BNP (last 3  results) No results for input(s): PROBNP in the last 8760 hours. CBG: No results for input(s): GLUCAP in the last 168 hours.  Radiographic Studies: Dg Chest 2 View  03/02/2015  CLINICAL DATA:  Shortness of breath, cough and congestion since Sunday, history coronary artery disease, chronic renal insufficiency, coronary artery disease, hypertension, type II diabetes mellitus, metastatic breast cancer EXAM: CHEST  2 VIEW COMPARISON:  09/21/2014 FINDINGS: RIGHT subclavian Port-A-Cath with tip projecting over SVC. Normal heart size, mediastinal contours, and pulmonary vascularity. Atherosclerotic calcification aorta. Eventration of the anterior RIGHT diaphragm. Increased markings at anterior RIGHT lung base question chronic atelectasis. Remaining lungs grossly clear. No pleural effusion or pneumothorax. Diffuse osteosclerosis again identified, patchy, likely related to known osseous metastatic disease. IMPRESSION: Chronic RIGHT basilar atelectasis. No definite acute pulmonary abnormalities. Diffuse osseous metastatic disease. Electronically Signed   By: Lavonia Dana M.D.   On: 03/02/2015 12:33   *I have personally reviewed the images above*  EKG: Independently reviewed. Sinus tachycardia at 115 bpm, irregular rate.   Assessment/Plan:   Principal Problem:   Sepsis ([place]) - Sepsis order set utilized. Admitted to the SDU. - Meets 2 or more SIRS criteria (Tem > 100.9 <96.8; HR >90; RR >20; PaCO2<32; WBC >12K<4K or >10 %bands AND has evidence of acute  organ failure (lactic acidosis, oliguria, ALI, ARDS, coagulopathy/DIC, AMS, hypotension/shock) - This patient is at high risk of poor outcomes with a SOFA score of 2 (at least 2 of the following clinical criteria: respiratory rate of 22/min or greater, altered mentation, or systolic blood pressure of 100 mm Hg or less. - Source thought to be from HCAP. Influenza neg. Check respiratory virus panel. - Send blood, urine and sputum cultures. - WBC is 6, lactic  acid is 4.17.  Check pro-calcitoninin. - Fluid volume resuscitate with 30 mg/kg using weight based algorithm per sepsis order set. - Start targeted antibiotics with Vanc/Zosyn.  Active Problems:   Hypothyroidism - Continue Synthroid.    Essential hypertension - Continue Coreg as blood pressure can tolerate. - Hold Lasix. Blood pressure soft.    Breast cancer metastasized to bone [facility]) and liver - Under the care of Dr. [name]. Status post chemotherapy 02/24/15 followed by Neulasta support. - Will notify Dr. [name] of the patient's admission.    Mixed collagen vascular disease ([place])     CAD (coronary artery disease), native coronary artery - Continue Lipitor and aspirin/Plavix.     Diabetes mellitus with complication (HCC) - Continue Levemir.     Anemia of chronic renal failure, stage 4 (severe) (HCC) - Hemoglobin 8.9 mg/dL. No current indication for transfusion. - Baseline creatinine 0.97. Current creatinine elevated over usual baseline values. Gently hydrate.    Pain from bone metastases (HCC) - Continue MS Contin.    DVT prophylaxis - Lovenox ordered.  Code Status / Family Communication / Disposition Plan:   Code Status: Full. Family Communication: Brother and sister at the bedside. Disposition Plan: Home when stable, likely a 3 day hospital stay.  Attestation regarding necessity of inpatient status:   The appropriate admission status for this patient is INPATIENT. Inpatient status is judged to be reasonable and necessary in order to provide the required intensity of service to ensure the patient's safety. The patient's presenting symptoms, physical exam findings, and initial radiographic and laboratory data in the context of their chronic comorbidities is felt to place them at high risk for further clinical deterioration. Furthermore, it is not anticipated that the patient will be medically stable for discharge from the hospital within 2 midnights of admission. The  following factors support the admission status of inpatient.   -The patient's presenting symptoms include fever, dizziness, upper respiratory symptoms . - The worrisome physical exam findings include fever, tachycardia with an irregular rhythm. - The initial radiographic and laboratory data are worrisome because of lactic acidosis . - The chronic co-morbidities include stage IV breast cancer with recent chemotherapy and neutropenia. - Patient requires inpatient status due to high intensity of service, high risk for further deterioration and high frequency of surveillance required. - I certify that at the point of admission it is my clinical judgment that the patient will require inpatient hospital care spanning beyond 2 midnights from the point of admission.   Time spent: 70 minutes.   RAMA,CHRISTINA [facility] Pager [phone] Cell: [phone]   If 7PM-7AM, please contact night-coverage [email or web address] Password [email or web address] 03/02/2015, 1:46 PM

## 2015-03-02 NOTE — Progress Notes (Signed)
Pharmacy Antibiotic Note  Brittney Cunningham is a 75 y.o. female admitted on 03/02/2015 with sepsis.  Pharmacy has been consulted for vancomycin + zosyn dosing. Tmax is 100.8, WBC is pending. Scr is 1.48 and lactic acid is elevated at 4.17. First doses ordered by EDP.   Plan: - Zosyn 3.375gm IV Q8H (4 hr inf) - Vanc 1gm IV Q24H - F/u renal fxn, C&S, clinical status and trough at SS  Height: 5\' 3"  (160 cm) Weight: 159 lb (72.122 kg) IBW/kg (Calculated) : 52.4  Temp (24hrs), Avg:99.1 F (37.3 C), Min:98.1 F (36.7 C), Max:100.8 F (38.2 C)   Recent Labs Lab 02/24/15 1031 02/24/15 1031 03/02/15 1120 03/02/15 1136  WBC 2.4*  --  6.0  --   CREATININE  --  1.7* 1.48*  --   LATICACIDVEN  --   --   --  4.17*    Estimated Creatinine Clearance: 32.7 mL/min (by C-G formula based on Cr of 1.48).    Allergies  Allergen Reactions  . Chlorhexidine Hives, Itching and Rash    This was most likely a CONTACT DERMATITIS versus true systemic allergic reaction  . Adhesive [Tape] Hives  . Codeine Swelling    Antimicrobials this admission: Vanc 1/31>> Zosyn 1/31>>  Dose adjustments this admission: N/A  Microbiology results:   Thank you for allowing pharmacy to be a part of this patient's care.  Devontay Celaya, Rande Lawman 03/02/2015 12:16 PM

## 2015-03-02 NOTE — ED Notes (Signed)
Patient transported to X-ray 

## 2015-03-02 NOTE — Progress Notes (Signed)
RN informed K Baltazar Najjar of lactic acid of 2.5; K Kirby ordered 500cc bolus; RN will continue to monitor.

## 2015-03-02 NOTE — Progress Notes (Signed)
Accepted for admission is this 75 yo female w/ PMH of metastatic breast CA who presented with sepsis, possible viral syndrome.  SDU bed requested given lactic acidosis, tachycardia, fever.  Brittney Cunningham 03/02/2015 1:33 PM

## 2015-03-02 NOTE — ED Notes (Signed)
[facility] is aware of room 1232 at [facility] for transfer.

## 2015-03-02 NOTE — ED Notes (Signed)
[facility] is carrying the patient to [facility] instead of [facility] 

## 2015-03-02 NOTE — ED Notes (Addendum)
Doug called from [facility] truck that was coming to transport room 2 broke down, it will be a truck coming in one to two hour to transfer patient to [facility].

## 2015-03-03 ENCOUNTER — Encounter: Payer: Self-pay | Admitting: Family Medicine

## 2015-03-03 DIAGNOSIS — E876 Hypokalemia: Secondary | ICD-10-CM

## 2015-03-03 DIAGNOSIS — C50911 Malignant neoplasm of unspecified site of right female breast: Secondary | ICD-10-CM

## 2015-03-03 DIAGNOSIS — R55 Syncope and collapse: Secondary | ICD-10-CM

## 2015-03-03 DIAGNOSIS — C787 Secondary malignant neoplasm of liver and intrahepatic bile duct: Secondary | ICD-10-CM

## 2015-03-03 DIAGNOSIS — J209 Acute bronchitis, unspecified: Secondary | ICD-10-CM

## 2015-03-03 DIAGNOSIS — C7951 Secondary malignant neoplasm of bone: Secondary | ICD-10-CM

## 2015-03-03 DIAGNOSIS — E162 Hypoglycemia, unspecified: Secondary | ICD-10-CM | POA: Diagnosis not present

## 2015-03-03 DIAGNOSIS — D649 Anemia, unspecified: Secondary | ICD-10-CM

## 2015-03-03 LAB — BASIC METABOLIC PANEL
ANION GAP: 10 (ref 5–15)
BUN: 43 mg/dL — ABNORMAL HIGH (ref 6–20)
CALCIUM: 6.7 mg/dL — AB (ref 8.9–10.3)
CO2: 24 mmol/L (ref 22–32)
CREATININE: 1.45 mg/dL — AB (ref 0.44–1.00)
Chloride: 104 mmol/L (ref 101–111)
GFR calc non Af Amer: 35 mL/min — ABNORMAL LOW (ref >60)
GFR, EST AFRICAN AMERICAN: 41 mL/min — AB (ref >60)
Glucose, Bld: 65 mg/dL (ref 65–99)
Potassium: 3.4 mmol/L — ABNORMAL LOW (ref 3.5–5.1)
SODIUM: 138 mmol/L (ref 135–145)

## 2015-03-03 LAB — GLUCOSE, CAPILLARY
GLUCOSE-CAPILLARY: 62 mg/dL — AB (ref 65–99)
GLUCOSE-CAPILLARY: 147 mg/dL — AB (ref 65–99)
Glucose-Capillary: 37 mg/dL — CL (ref 65–99)
Glucose-Capillary: 102 mg/dL — ABNORMAL HIGH (ref 65–99)
Glucose-Capillary: 119 mg/dL — ABNORMAL HIGH (ref 65–99)
Glucose-Capillary: 116 mg/dL — ABNORMAL HIGH (ref 65–99)

## 2015-03-03 LAB — CBC
HCT: 22 % — ABNORMAL LOW (ref 36.0–46.0)
HEMOGLOBIN: 6.9 g/dL — AB (ref 12.0–15.0)
MCH: 29.6 pg (ref 26.0–34.0)
MCHC: 31.4 g/dL (ref 30.0–36.0)
MCV: 94.4 fL (ref 78.0–100.0)
PLATELETS: 158 10*3/uL (ref 150–400)
RBC: 2.33 MIL/uL — AB (ref 3.87–5.11)
RDW: 16.4 % — ABNORMAL HIGH (ref 11.5–15.5)
WBC: 5 10*3/uL (ref 4.0–10.5)

## 2015-03-03 LAB — PREPARE RBC (CROSSMATCH)

## 2015-03-03 LAB — TROPONIN I
TROPONIN I: 0.3 ng/mL — AB (ref <0.031)
TROPONIN I: 0.28 ng/mL — AB (ref <0.031)
Troponin I: 0.21 ng/mL — ABNORMAL HIGH (ref <0.031)

## 2015-03-03 MED ORDER — GUAIFENESIN ER 600 MG PO TB12
600.0000 mg | ORAL_TABLET | Freq: Two times a day (BID) | ORAL | Status: DC
  Administered 2015-03-03 – 2015-03-15 (×22): 600 mg via ORAL
  Filled 2015-03-03 (×27): qty 1

## 2015-03-03 MED ORDER — SODIUM CHLORIDE 0.9 % IV SOLN: Freq: Once | INTRAVENOUS | Status: DC

## 2015-03-03 MED ORDER — INSULIN ASPART 100 UNIT/ML ~~LOC~~ SOLN
0.0000 [IU] | Freq: Three times a day (TID) | SUBCUTANEOUS | Status: DC
  Administered 2015-03-04: 2 [IU] via SUBCUTANEOUS
  Administered 2015-03-04 (×2): 1 [IU] via SUBCUTANEOUS
  Administered 2015-03-05: 2 [IU] via SUBCUTANEOUS
  Administered 2015-03-06 – 2015-03-12 (×11): 1 [IU] via SUBCUTANEOUS
  Administered 2015-03-12: 2 [IU] via SUBCUTANEOUS
  Administered 2015-03-13: 1 [IU] via SUBCUTANEOUS
  Administered 2015-03-13 – 2015-03-14 (×2): 2 [IU] via SUBCUTANEOUS
  Administered 2015-03-14: 1 [IU] via SUBCUTANEOUS
  Administered 2015-03-15: 2 [IU] via SUBCUTANEOUS

## 2015-03-03 MED ORDER — KCL IN DEXTROSE-NACL 40-5-0.9 MEQ/L-%-% IV SOLN
INTRAVENOUS | Status: DC
  Administered 2015-03-03 – 2015-03-04 (×2): via INTRAVENOUS
  Administered 2015-03-04: 100 mL/h via INTRAVENOUS
  Filled 2015-03-03 (×4): qty 1000

## 2015-03-03 MED ORDER — POTASSIUM CHLORIDE IN NACL 40-0.9 MEQ/L-% IV SOLN
INTRAVENOUS | Status: DC
  Filled 2015-03-03: qty 1000

## 2015-03-03 MED ORDER — INSULIN ASPART 100 UNIT/ML ~~LOC~~ SOLN
0.0000 [IU] | Freq: Every day | SUBCUTANEOUS | Status: DC
  Administered 2015-03-11: 0 [IU] via SUBCUTANEOUS

## 2015-03-03 NOTE — Progress Notes (Signed)
CRITICAL VALUE ALERT  Critical value received:  Hgb 6.9  Date of notification:  03/03/2015  Time of notification:  [id number]  Critical value read back:Yes.    Nurse who received alert:  Reche Dixon  MD notified (1st page):  Raliegh Ip Baltazar Najjar  Time of first page:  [phone]  MD notified (2nd page):  Time of second page:  Responding MD:  Tylene Fantasia  Time MD responded:  [phone]

## 2015-03-03 NOTE — Progress Notes (Signed)
Progress Note   Brittney Cunningham KVQ:[id number] DOB: [birth year] DOA: 03/02/2015 PCP: [name], MD   Brief Narrative:   Brittney Cunningham is an 75 y.o. female with a PMH of stage IV right breast cancer diagnosed May/2011 with metastasis to the bone and liver status post neoadjuvant therapy, right modified radical mastectomy 03/2010 and radiation treatment, under the care of Dr. [name] who is currently treating her for progressive disease with eribulin with last dose given 02/24/15 followed by Neulasta support, who was admitted 03/02/15 with dizziness, fatigue and upper respiratory symptoms. Upon initial presentation, she was found to be tachycardic, tachypneic with a fever of 100.8. She was admitted for possible sepsis.  Assessment/Plan:   Principal Problem:  Sepsis ([place]) - Sepsis order set utilized. Admitted to the SDU. - Met 2 or more SIRS criteria (Tem > 100.9 <96.8; HR >90; RR >20; PaCO2<32; WBC >12K<4K or >10 %bands AND has evidence of acute organ failure (lactic acidosis, oliguria, ALI, ARDS, coagulopathy/DIC, AMS, hypotension/shock) - This patient is at high risk of poor outcomes with a SOFA score of 2 (at least 2 of the following clinical criteria: respiratory rate of 22/min or greater, altered mentation, or systolic blood pressure of 100 mm Hg or less. - Source thought to be from HCAP. Influenza neg. respiratory virus panel pending. - Follow-up blood, urine and sputum cultures. - WBC is 6, lactic acid is 4.17. Pro calcitonin 2.93, supporting a diagnosis of sepsis. - Status post fluid volume resuscitation. The pressure stable. Still mildly tachycardic. - Continue empiric Vanc/Zosyn.  Active Problems:   Irregular heart sounds - Check 12 lead EKG.   Hypothyroidism - Continue Synthroid.   Essential hypertension - Continue Coreg as blood pressure can tolerate. - Hold Lasix. Blood pressure soft.   Breast cancer metastasized to bone [facility]) and liver - Under the care  of Dr. [name]. Status post chemotherapy 02/24/15 followed by Neulasta support. - Will notify Dr. [name] of the patient's admission.   Mixed collagen vascular disease (HCC)    Hypokalemia - We'll add potassium to IV fluids.    CAD (coronary artery disease), native coronary artery - Continue Lipitor and aspirin/Plavix.    Diabetes mellitus with complication (HCC) / hypoglycemia - Hold Levemir, glucose 65 this morning. Add dextrose to IVF. - Use insulin sensitive SSI for now.    Anemia of chronic renal failure, stage 4 (severe) (HCC) - Hemoglobin 6.9 mg/dL. we'll give 1 unit of PRBCs this morning. - Baseline creatinine 0.97. Current creatinine elevated over usual baseline values. Gently hydrate.   Pain from bone metastases (HCC) - Continue MS Contin and MSIR for breakthrough pain.   DVT prophylaxis - Lovenox ordered.   Family Communication/Anticipated D/C date and plan/Code Status   Family Communication: Updated 03/02/15, no family present this a.m. Disposition Plan: Home when sepsis resolved. Anticipated D/C date:   03/06/15. Code Status: Full code.   IV Access:    Peripheral IV   Procedures and diagnostic studies:   Dg Chest 2 View  03/02/2015  CLINICAL DATA:  Shortness of breath, cough and congestion since Sunday, history coronary artery disease, chronic renal insufficiency, coronary artery disease, hypertension, type II diabetes mellitus, metastatic breast cancer EXAM: CHEST  2 VIEW COMPARISON:  09/21/2014 FINDINGS: RIGHT subclavian Port-A-Cath with tip projecting over SVC. Normal heart size, mediastinal contours, and pulmonary vascularity. Atherosclerotic calcification aorta. Eventration of the anterior RIGHT diaphragm. Increased markings at anterior RIGHT lung base question chronic atelectasis. Remaining lungs grossly clear. No pleural  effusion or pneumothorax. Diffuse osteosclerosis again identified, patchy, likely related to known osseous metastatic disease.  IMPRESSION: Chronic RIGHT basilar atelectasis. No definite acute pulmonary abnormalities. Diffuse osseous metastatic disease. Electronically Signed   By: Lavonia Dana M.D.   On: 03/02/2015 12:33     Medical Consultants:    None.  Anti-Infectives:   Vancomycin 03/02/15---> Zosyn 03/02/15--->   Subjective:   Brittney Cunningham has a cough and chest congestion.  She is very weak per Therapist, sports.  No complaints of pain, N/V/D.  Objective:    Filed Vitals:   03/03/15 0314 03/03/15 0400 03/03/15 0600 03/03/15 0800  BP:  118/31 114/40 107/23  Pulse:  102 83 100  Temp: 98 F (36.7 C)   98.4 F (36.9 C)  TempSrc: Oral   Oral  Resp:  _0 Height:      Weight:      SpO2:  94% 93% 94%    Intake/Output Summary (Last 24 hours) at 03/03/15 0837 Last data filed at 03/03/15 0700  Gross per 24 hour  Intake 2712.5 ml  Output      0 ml  Net 2712.5 ml   Filed Weights   03/02/15 1103  Weight: 72.122 kg (159 lb)    Exam: Gen:  NAD Cardiovascular:  Tachy, irregular at times Respiratory:  Lungs course Gastrointestinal:  Abdomen soft, NT/ND, + BS Extremities:  Trace edema   Data Reviewed:    Labs: Basic Metabolic Panel:  Recent Labs Lab 02/24/15 1031 03/02/15 1120 03/02/15 1654 03/03/15 0405  NA 132* 132*  --  138  K 3.9 4.0  --  3.4*  CL  --  95*  --  104  CO2 25 21*  --  24  GLUCOSE 204* 193*  --  65  BUN 63.1* 45*  --  43*  CREATININE 1.7* 1.48* 1.36* 1.45*  CALCIUM 9.1 7.6*  --  6.7*   GFR Estimated Creatinine Clearance: 33.4 mL/min (by C-G formula based on Cr of 1.45). Liver Function Tests:  Recent Labs Lab 02/24/15 1031 03/02/15 1120  AST 26 28  ALT 10 11*  ALKPHOS 145 94  BILITOT 0.72 1.4*  PROT 6.8 6.3*  ALBUMIN 3.1* 2.9*   No results for input(s): LIPASE, AMYLASE in the last 168 hours. No results for input(s): AMMONIA in the last 168 hours. Coagulation profile  Recent Labs Lab 03/02/15 1654  INR 1.34    CBC:  Recent Labs Lab  02/24/15 1031 03/02/15 1120 03/02/15 1654 03/03/15 0405  WBC 2.4* 6.0 3.9* 5.0  NEUTROABS 0.9* 3.9  --   --   HGB 9.6* 8.9* 7.5* 6.9*  HCT 30.2* 29.0* 23.7* 22.0*  MCV 91.8 92.9 92.9 94.4  PLT 208 192 161 158   Sepsis Labs:  Recent Labs Lab 02/24/15 1031 03/02/15 1120 03/02/15 1136 03/02/15 1459 03/02/15 1654 03/02/15 2154 03/03/15 0405  PROCALCITON  --   --   --   --  2.93  --   --   WBC 2.4* 6.0  --   --  3.9*  --  5.0  LATICACIDVEN  --   --  4.17* 2.26*  --  2.5*  --    Microbiology Recent Results (from the past 240 hour(s))  Culture, blood (routine x 2)     Status: None (Preliminary result)   Collection Time: 03/02/15 11:20 AM  Result Value Ref Range Status   Specimen Description   Final    BLOOD LEFT ANTECUBITAL Performed at [facility]  Special Requests BOTTLES DRAWN AEROBIC AND ANAEROBIC 5CC EACH  Final   Culture PENDING  Incomplete   Report Status PENDING  Incomplete  Culture, blood (routine x 2)     Status: None (Preliminary result)   Collection Time: 03/02/15 11:50 AM  Result Value Ref Range Status   Specimen Description   Final    BLOOD RIGHT PORTA CATH Performed at [facility]    Special Requests BOTTLES DRAWN AEROBIC AND ANAEROBIC 10CC EACH  Final   Culture PENDING  Incomplete   Report Status PENDING  Incomplete  MRSA PCR Screening     Status: None   Collection Time: 03/02/15  4:12 PM  Result Value Ref Range Status   MRSA by PCR NEGATIVE NEGATIVE Final    Comment:        The GeneXpert MRSA Assay (FDA approved for NASAL specimens only), is one component of a comprehensive MRSA colonization surveillance program. It is not intended to diagnose MRSA infection nor to guide or monitor treatment for MRSA infections.      Medications:   . sodium chloride   Intravenous Once  . aspirin  81 mg Oral Daily  . atorvastatin  80 mg Oral Daily  . calcium carbonate  1 tablet Oral TID WC  . carvedilol  12.5 mg Oral BID WC  .  clopidogrel  75 mg Oral Daily  . cyanocobalamin  1,000 mcg Intramuscular Q30 days  . enoxaparin (LOVENOX) injection  40 mg Subcutaneous Q24H  . gabapentin  100 mg Oral TID  . insulin detemir  15 Units Subcutaneous QHS  . levothyroxine  150 mcg Oral QAC breakfast  . mirtazapine  15 mg Oral QHS  . morphine  30 mg Oral TID  . pantoprazole  40 mg Oral Daily  . piperacillin-tazobactam (ZOSYN)  IV  3.375 g Intravenous Q8H  . senna-docusate  1 tablet Oral QHS  . vancomycin  1,000 mg Intravenous Q24H   Continuous Infusions: . sodium chloride Stopped (03/02/15 1249)  . sodium chloride 100 mL/hr at 03/02/15 1658    Time spent: 35 minutes.  The patient is medically complex with multiple co-morbidities and is at high risk for clinical deterioration and requires high complexity decision making.    LOS: 1 day   Barbette Mcglaun  [facility] Pager [phone]. If unable to reach me by pager, please call my cell phone at [phone].  *Please refer to [email or web address], password [email or web address] to get updated schedule on who will round on this patient, as hospitalists switch teams weekly. If 7PM-7AM, please contact night-coverage at [email or web address], password [email or web address] for any overnight needs.  03/03/2015, 8:37 AM

## 2015-03-03 NOTE — Progress Notes (Signed)
Initial Nutrition Assessment  DOCUMENTATION CODES:   Not applicable  INTERVENTION:  -Magic cup TID with meals, each supplement provides 290 kcal and 9 grams of protein  NUTRITION DIAGNOSIS:   Inadequate oral intake related to poor appetite as evidenced by per patient/family report.  GOAL:   Patient will meet greater than or equal to 90% of their needs  MONITOR:   PO intake, Labs, I & O's, Skin  REASON FOR ASSESSMENT:   Malnutrition Screening Tool   ASSESSMENT:   Brittney Cunningham is an 75 y.o. 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  Pt stated her usual body weight is 150-160#. No significant wt loss at this time. Pt stated that her husband has multiple myleoma and she is taking care of him at home as well.  She reports relatively normal PO intake at home. Cereal in the AM, a sandwich for lunch, and she "cooks a meal" for dinner.But she hadn't eaten much in the past 3-4 days because she didn't feel well.   Nutrition-Focused physical exam completed. Findings are no fat depletion, mild muscle depletion, and no edema.   Labs: CBGs 37-112, K 3.4, BUN 43, Cr 1.45, Ca 6.7, Phos 2.1, Mg 1.4, Lipase 11, ALT 11  Medications: Morphine PRN  Diet Order:  Diet Carb Modified Fluid consistency:: Thin; Room service appropriate?: Yes  Skin:  Reviewed, no issues  Last BM:  03/02/2015  Height:   Ht Readings from Last 1 Encounters:  03/02/15 5\' 3"  (1.6 m)    Weight:   Wt Readings from Last 1 Encounters:  03/02/15 159 lb (72.122 kg)    Ideal Body Weight:  52.27 kg  BMI:  Body mass index is 28.17 kg/(m^2).  Estimated Nutritional Needs:   Kcal:  1550-1800 calories  Protein:  70-85 grams  Fluid:  >/= 1.55L  EDUCATION NEEDS:   No education needs identified  at this time  Satira Anis. Mozes Sagar, MS, RD LDN After Hours/Weekend Pager [phone]

## 2015-03-03 NOTE — Progress Notes (Signed)
Dr. Rockne Menghini notified of positive Troponin. Patient is on a Daily Baby ASA.

## 2015-03-03 NOTE — Care Management Note (Signed)
Case Management Note  Patient Details  Name: Brittney Cunningham MRN: [id number] Date of Birth: 06-Jul-1940  Subjective/Objective:         Anemia and pna           Action/Plan:Date: March 03, 2015 Chart reviewed for concurrent status and case management needs. Will continue to follow patient for changes and needs: Velva Harman, BSN, RN, [place]   [phone]   Expected Discharge Date:                  Expected Discharge Plan:  Home/Self Care  In-House Referral:  NA  Discharge planning Services  CM Consult  Post Acute Care Choice:  NA Choice offered to:  NA  DME Arranged:    DME Agency:     HH Arranged:    [place] Agency:     Status of Service:  Completed, signed off  Medicare Important Message Given:    Date Medicare IM Given:    Medicare IM give by:    Date Additional Medicare IM Given:    Additional Medicare Important Message give by:     If discussed at [place] of Stay Meetings, dates discussed:    Additional Comments:  Leeroy Cha, RN 03/03/2015, 10:21 AM

## 2015-03-03 NOTE — Consult Note (Signed)
[place]  [phone] [phone]     ID: Brittney Cunningham DOB: 14-Aug-1940  MR#: [id number]  QVZ#:[id number]  Patient Care Team: [name], MD as PCP - General (Family Medicine) Chauncey Cruel, MD (Hematology and Oncology) Peter M Martinique, MD (Cardiology) Serafina Mitchell, MD as Consulting Physician (Vascular Surgery) PCP: [name], MD . CHIEF COMPLAINT: syncope; bronchitis; anemia; stage IV breast cancer  CURRENT TREATMENT: eribulin; antibiotics; transfusions   BREAST CANCER HISTORY:  Brittney Cunningham was diagnosed with right breast carcinoma in May of 2011, a biopsy showing a grade 2 invasive ductal carcinoma, T2 NXM1, stage IV at presentation. There was bone only involvement. Tumor was strongly ER and PR positive, HER-2/neu negative, with an MIP-1 of 26%.  She was treated neoadjuvantly with letrozole and zoledronic acid beginning in June of 2011. She is status post right modified radical mastectomy in February 2012 for a ypT2 ypN2, grade 1 invasive ductal carcinoma. Margins were negative.   Her subsequent history is as detailed below  INTERVAL HISTORY: Just fell at home 02/27/2015. She hit her right shoulder, not her head. She "just passed out" while opening the refrigerator. She did well through the next day but on 02/28/2015 started to have increased cough and fatigue, without obvious fever. She saw Dr Ernestine Conrad for a regularly scheduled appointment 03/02/2015 and was found to be febrile and hypotensive. She was directed to the ED where she was found to have a temperature of 100.8, BP 96/50, RR 17-32, but no PNA on CXR. WBC were 6.0 w ANC 3.9. Lactic acid 4.17. Hb 8.9 w normal indices. She was admitted w a Dx os sepsis syndrome and started on pip/tazo and vanco.  Brittney Cunningham is currently day 16 cycle 1 of eribulin which is given days 1 and 8 of every 21 days cycle. She received neulasta 02/25/2015  REVIEW OF SYSTEMS: She was asleep when I came to  see her; awoke readily; gave me above history w/o hesitation or confusion. Denies h/a, N/V, visual changes. Was not aware of diffiness prior to syncope and did not lose consciousness as best as she can tell. No incontinence or post ictal Sx. C/O cough which sounds wet but is nonproductive. Denies D/C, dysuria, hematuria or frequency. She had some transient aches after neulasta but these have resolved. A detailed ROS was otherwise stable  PAST MEDICAL HISTORY: Past Medical History  Diagnosis Date  . 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 Anxiety and depression   . GERD (gastroesophageal reflux disease)   . Hyperlipidemia   . Hypothyroidism   . Osteoarthritis   . Carotid artery stenosis     bilateral  . CAD (coronary artery disease)     medical therapy, old tot RCA, patent LAD, mod. severe ostial LCX stenosis--PCI would be too complex-med mgmt recommended.  . OSA (obstructive sleep apnea)   . Hypertension   . Carotid artery occlusion   . Unspecified constipation 03/15/2012  . Bursitis   . Collagen vascular disease ([place])   . History of radiation therapy 10/31/12-11/13/12    rt&lt humerous 30Gy/49f  . Valvular heart disease 07/11/2013  . Diarrhea 09/28/2013  . Chronic renal insufficiency, stage II (mild)     Borderline stage II/III, CrCl 50s/60s.  . History of blood transfusion   . Urinary tract bacterial infections   . Breast cancer ([place] 05/2009    Grade 2 invasive ductal carcinoma, T2 NXM1, stage IV at presentation.  There was bone only involvement. New liver lesions 12/2014 (path confirmed mets): pt to start chemo (eribucin) treatments to start on February 17, 2015. She will receive treatment on the first and eighth day of each 21 day cycle. The plan is to go to 3 or 4 cycles before restaging  . Diabetes mellitus type II     insulin  . Hypocalcemia 07/2014    required IV calcium in ED once and was admitted to hosp once.  ? secondary to [facility] and everolimus?  .  Drug-induced pneumonitis 08/2014    (Everolimus) CXR 09/21/14 improved; needs another CXR 2-3 wks later to make sure resolution occurs.    PAST SURGICAL HISTORY: Past Surgical History  Procedure Laterality Date  . Incisional breast biopsy      remoted left breast biopsy  . Cesarean section    . Other surgical history      GYN surgery  . Knee surgery      right knee surgery  . Carotid endarterectomy  2011    right carotid  . Port-a-cath removal    . Modified radical mastectomy  Feb 2012    Right breast  . Carotid stent      left  . Cardiac catheterization  06/2013    old total occ. of RCA, moderately severe ostial LCX stenosis- medical therapy- would be complex PCI  . Carotid angiogram N/A 03/01/2011    Procedure: CAROTID ANGIOGRAM;  Surgeon: Conrad Houston, MD;  Location[facility] C[facility] LAB;  Service: Cardiovascular;  Laterality: N/A;  . Arch aortogram  03/01/2011    Procedure: ARCH AORTOGRAM;  Surgeon: Conrad St. Rose, MD;  Location: [facility] CATH LAB;  Service: Cardiovascular;;  . Carotid stent insertion Left 03/15/2011    Procedure: CAROTID STENT INSERTION;  Surgeon: Serafina Mitchell, MD;  Location: [facility] CATH LAB;  Service: Cardiovascular;  Laterality: Left;  . Left heart catheterization with coronary angiogram N/A 07/07/2013    Procedure: LEFT HEART CATHETERIZATION WITH CORONARY ANGIOGRAM;  Surgeon: Blane Ohara, MD;  Location: [facility] CATH LAB;  Service: Cardiovascular;  Laterality: N/A;  . Carotid dopplers  05/2014    Patent ICAs, bilat ECA dz: no change compared to 2014  . Transthoracic echocardiogram  06/2013    EF 55-60%, grd I diast dysfxn, mod mitral regurg    FAMILY HISTORY Family History  Problem Relation Age of Onset  . Arthritis    . Coronary artery disease      first degree relative  . Stroke      first degree relative  . Diabetes Mother   . Heart disease Mother   . Hypertension Mother   . Cancer Mother   . Deep vein thrombosis Mother   . Colon cancer Neg Hx   . Stomach cancer Neg  Hx   . Cancer Father     Pancreatic cancer  . Breast cancer Paternal Aunt 30    GYNECOLOGIC HISTORY:  Patient's last menstrual period was 03/13/2012. The patient is GX, P3. First pregnancy to term age 55. She went through the change of life in her late 58s. She never took hormones.   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 Her husband, Brittney Cunningham, is disabled secondary to a fall. He has difficulty with walking. He used to work for [employer] previously. The patient's son, Brittney Cunningham, lives in [place], and works for [employer]. Daughter, Brittney Cunningham, lives in [place], and works for [employer]. Daughter, Brittney Cunningham, lives in [place], [place], and works as a Geologist, engineering. The patient attends [employer].    ADVANCED DIRECTIVES: Not on file; During a prior hospitalization the patient agreed to a DO NOT RESUSCITATE order. However that is now not current. On 09/21/2014 I gave her the appropriate forms to consider and notarize at her discretion   HEALTH MAINTENANCE: Social History  Substance Use Topics  . Smoking status: Former Smoker -- 1.00 packs/day for 20 years    Types: Cigarettes    Quit date: 01/31/1988  . Smokeless tobacco: Never Used  . Alcohol Use: No     Colonoscopy:  PAP:  Bone density:  Lipid panel:  Allergies  Allergen Reactions  . Chlorhexidine Hives, Itching and Rash    This was most likely a CONTACT DERMATITIS versus true systemic allergic reaction  . Adhesive [Tape] Hives  . Codeine Swelling    Current Facility-Administered Medications  Medication Dose Route Frequency Provider Last Rate Last Dose  . acetaminophen (TYLENOL) tablet 650 mg  650 mg Oral Q6H PRN Venetia Maxon Rama, MD       Or  . acetaminophen (TYLENOL) suppository 650 mg  650 mg Rectal Q6H PRN Christina P Rama, MD      . aspirin chewable tablet 81 mg  81 mg Oral Daily Christina P Rama, MD   81 mg at 03/03/15 1140  . atorvastatin (LIPITOR)  tablet 80 mg  80 mg Oral Daily Venetia Maxon Rama, MD   80 mg at 03/03/15 1141  . calcium carbonate (TUMS - dosed in mg elemental calcium) chewable tablet 200 mg of elemental calcium  1 tablet Oral TID WC Christina P Rama, MD   200 mg of elemental calcium at 03/03/15 1142  . carvedilol (COREG) tablet 12.5 mg  12.5 mg Oral BID WC Venetia Maxon Rama, MD   12.5 mg at 03/03/15 0813  . clopidogrel (PLAVIX) tablet 75 mg  75 mg Oral Daily Venetia Maxon Rama, MD   75 mg at 03/03/15 1141  . cyanocobalamin ((VITAMIN B-12)) injection 1,000 mcg  1,000 mcg Intramuscular Q30 days Christina P Rama, MD      . dextrose 5 % and 0.9 % NaCl with KCl 40 mEq/L infusion   Intravenous Continuous Venetia Maxon Rama, MD 100 mL/hr at 03/03/15 1003    . enoxaparin (LOVENOX) injection 40 mg  40 mg Subcutaneous Q24H Venetia Maxon Rama, MD   40 mg at 03/02/15 2147  . gabapentin (NEURONTIN) capsule 100 mg  100 mg Oral TID Venetia Maxon Rama, MD   100 mg at 03/03/15 1140  . glycerin adult suppository 1 suppository  1 suppository Rectal Once PRN Venetia Maxon Rama, MD      . guaiFENesin (MUCINEX) 12 hr tablet 600 mg  600 mg Oral BID Venetia Maxon Rama, MD   600 mg at 03/03/15 1141  . insulin aspart (novoLOG) injection 0-5 Units  0-5 Units Subcutaneous QHS Christina P Rama, MD      . insulin aspart (novoLOG) injection 0-9 Units  0-9 Units Subcutaneous TID WC Christina P Rama, MD      . levothyroxine (SYNTHROID, LEVOTHROID) tablet 150 mcg  150 mcg Oral QAC breakfast Venetia Maxon Rama, MD   150 mcg at 03/03/15 0813  . mirtazapine (REMERON) tablet 15 mg  15 mg Oral QHS Venetia Maxon Rama, MD   15 mg at 03/02/15 2136  . morphine (MS CONTIN) 12 hr tablet 30 mg  30 mg Oral TID Venetia Maxon Rama, MD   30 mg at 03/03/15  1139  . morphine (MSIR) tablet 15 mg  15 mg Oral Q4H PRN Christina P Rama, MD      . nitroGLYCERIN (NITROSTAT) SL tablet 0.4 mg  0.4 mg Sublingual Q5 min PRN Christina P Rama, MD      . ondansetron (ZOFRAN) tablet 4 mg  4 mg Oral Q6H PRN  Venetia Maxon Rama, MD       Or  . ondansetron (ZOFRAN) injection 4 mg  4 mg Intravenous Q6H PRN Venetia Maxon Rama, MD   4 mg at 03/03/15 0948  . pantoprazole (PROTONIX) EC tablet 40 mg  40 mg Oral Daily Venetia Maxon Rama, MD   40 mg at 03/03/15 1140  . piperacillin-tazobactam (ZOSYN) IVPB 3.375 g  3.375 g Intravenous Q8H Rachel L Rumbarger, RPH   3.375 g at 03/03/15 1122  . polyethylene glycol (MIRALAX / GLYCOLAX) packet 17 g  17 g Oral Daily PRN Venetia Maxon Rama, MD      . senna-docusate (Senokot-S) tablet 1 tablet  1 tablet Oral QHS Venetia Maxon Rama, MD   1 tablet at 03/02/15 2136  . vancomycin (VANCOCIN) IVPB 1000 mg/200 mL premix  1,000 mg Intravenous Q24H Valeda Malm Rumbarger, RPH        OBJECTIVE: older White woman examined in bed, reeiving a blood transfusion Filed Vitals:   03/03/15 1132 03/03/15 1200  BP: 137/44 137/44  Pulse: 103   Temp: 99 F (37.2 C)   Resp: 16 21     Body mass index is 28.17 kg/(m^2).      Ocular: Sclerae unicteric, pupils equal, EOMs intact Ear-nose-throat: Oropharynx clear, slightly dry Lymphatic: No cervical or supraclavicular adenopathy Lungs no rales or rhonchi, auscultated anterolaterally Heart regular rate and rhythm Abd soft, nontender, no masses palpated MSK no focal spinal tenderness, no joint edema Neuro: non-focal, well-oriented, appropriate affect Breasts: deferred   LAB RESULTS:  CMP     Component Value Date/Time   NA 138 03/03/2015 0405   NA 132* 02/24/2015 1031   K 3.4* 03/03/2015 0405   K 3.9 02/24/2015 1031   CL 104 03/03/2015 0405   CL 104 07/08/2012 1023   CO2 24 03/03/2015 0405   CO2 25 02/24/2015 1031   GLUCOSE 65 03/03/2015 0405   GLUCOSE 204* 02/24/2015 1031   GLUCOSE 157* 07/08/2012 1023   BUN 43* 03/03/2015 0405   BUN 63.1* 02/24/2015 1031   CREATININE 1.45* 03/03/2015 0405   CREATININE 1.7* 02/24/2015 1031   CREATININE 1.37* 12/21/2014 0810   CALCIUM 6.7* 03/03/2015 0405   CALCIUM 9.1 02/24/2015 1031   PROT 6.3*  03/02/2015 1120   PROT 6.8 02/24/2015 1031   ALBUMIN 2.9* 03/02/2015 1120   ALBUMIN 3.1* 02/24/2015 1031   AST 28 03/02/2015 1120   AST 26 02/24/2015 1031   ALT 11* 03/02/2015 1120   ALT 10 02/24/2015 1031   ALKPHOS 94 03/02/2015 1120   ALKPHOS 145 02/24/2015 1031   BILITOT 1.4* 03/02/2015 1120   BILITOT 0.72 02/24/2015 1031   GFRNONAA 35* 03/03/2015 0405   GFRAA 41* 03/03/2015 0405    INo results found for: SPEP, UPEP  Lab Results  Component Value Date   WBC 5.0 03/03/2015   NEUTROABS 3.9 03/02/2015   HGB 6.9* 03/03/2015   HCT 22.0* 03/03/2015   MCV 94.4 03/03/2015   PLT 158 03/03/2015    _0 @  Lab Results  Component Value Date   LABCA2 194* 02/17/2015    No components found for: [id number]   Recent Labs Lab 03/02/15 1654  INR 1.34    Urinalysis    Component Value Date/Time   COLORURINE AMBER* 03/02/2015 2157   APPEARANCEUR CLOUDY* 03/02/2015 2157   LABSPEC 1.017 03/02/2015 2157   PHURINE 5.5 03/02/2015 2157   GLUCOSEU NEGATIVE 03/02/2015 2157   HGBUR NEGATIVE 03/02/2015 2157   BILIRUBINUR SMALL* 03/02/2015 2157   BILIRUBINUR negative 05/22/2014 1042   KETONESUR NEGATIVE 03/02/2015 2157   PROTEINUR NEGATIVE 03/02/2015 2157   PROTEINUR negative 05/22/2014 1042   UROBILINOGEN 0.2 05/22/2014 1042   UROBILINOGEN 1.0 05/09/2014 1039   NITRITE NEGATIVE 03/02/2015 2157   NITRITE negative 05/22/2014 1042   LEUKOCYTESUR MODERATE* 03/02/2015 2157    STUDIES: Dg Chest 2 View  03/02/2015  CLINICAL DATA:  Shortness of breath, cough and congestion since Sunday, history coronary artery disease, chronic renal insufficiency, coronary artery disease, hypertension, type II diabetes mellitus, metastatic breast cancer EXAM: CHEST  2 VIEW COMPARISON:  09/21/2014 FINDINGS: RIGHT subclavian Port-A-Cath with tip projecting over SVC. Normal heart size, mediastinal contours, and pulmonary vascularity. Atherosclerotic calcification aorta. Eventration of the anterior  RIGHT diaphragm. Increased markings at anterior RIGHT lung base question chronic atelectasis. Remaining lungs grossly clear. No pleural effusion or pneumothorax. Diffuse osteosclerosis again identified, patchy, likely related to known osseous metastatic disease. IMPRESSION: Chronic RIGHT basilar atelectasis. No definite acute pulmonary abnormalities. Diffuse osseous metastatic disease. Electronically Signed   By: Lavonia Dana M.D.   On: 03/02/2015 12:33   Mr Abdomen W Wo Contrast  02/04/2015  CLINICAL DATA:  75 year old female with history of breast cancer with bone metastasis. New liver lesions. EXAM: MRI ABDOMEN WITHOUT AND WITH CONTRAST TECHNIQUE: Multiplanar multisequence MR imaging of the abdomen was performed both before and after the administration of intravenous contrast. CONTRAST:  18m MULTIHANCE GADOBENATE DIMEGLUMINE 529 MG/ML IV SOLN COMPARISON:  None. FINDINGS: Lower chest: Again noted is a plaque-like area of pleural thickening in the lateral aspect of the lower right hemithorax measuring approximately 2.2 x 0.8 cm (image 15 of series 1201). Hepatobiliary: There are numerous (greater than 20) hepatic lesions on today's study which are T1 hypointense, mildly T2 hyperintense, restrict diffusion, and demonstrates progressive central enhancement on post gadolinium images, compatible with multiple new metastatic lesions. These are best demonstrated on precontrast T1 weighted images, with the largest of these lesions measures up to 1.3 x 1.7 cm (image 57 of series 1200) in segment 3 of the liver anteriorly. No intra or extrahepatic biliary ductal dilatation. Numerous tiny filling defects in the gallbladder are compatible with small gallstones. No current findings to suggest an acute cholecystitis at this time. Pancreas: Diffuse fatty atrophy in the pancreas. No pancreatic mass. No pancreatic ductal dilatation. No pancreatic or peripancreatic fluid or inflammatory changes. Spleen: Unremarkable.  Adrenals/Urinary Tract: Several sub cm lesions are noted in the kidneys bilaterally, T1 hypointense, T2 hyperintense and nonenhancing, compatible with tiny cysts. No hydroureteronephrosis in the visualized abdomen. Bilateral adrenal glands are normal in appearance. Stomach/Bowel: Visualized portions are unremarkable. Vascular/Lymphatic: No aneurysm identified in the visualized abdominal vasculature. Extensive atherosclerotic disease throughout the abdominal aorta. No lymphadenopathy noted in the abdomen. Other: No significant volume of ascites noted in the visualized peritoneal cavity. Musculoskeletal: No definite osseous lesions identified in the visualized portions of the skeleton on today's examination. IMPRESSION: 1. Numerous new hepatic lesions throughout all aspects of the liver measuring up to 1.3 x 1.7 cm in segment 3 of the liver, compatible with widespread hepatic metastases. 2. Plaque-like area of enhancing pleural thickening in the inferior aspect of the right hemithorax again  noted. This is nonspecific, but pleural metastasis is not excluded, and continued attention on followup studies is recommended. 3. Cholelithiasis without evidence of acute cholecystitis at this time. 4. Additional incidental findings, as above. Electronically Signed   By: Vinnie Langton M.D.   On: 02/04/2015 08:18   US Biopsy  02/05/2015  CLINICAL DATA:  History of breast carcinoma metastatic to bone. Imaging has revealed multiple new liver lesions and the patient presents for liver biopsy. EXAM: ULTRASOUND GUIDED CORE BIOPSY OF LIVER MEDICATIONS: 1.0 mg IV Versed; 50 mcg IV Fentanyl Total Moderate Sedation Time: 14 minutes. PROCEDURE: The procedure, risks, benefits, and alternatives were explained to the patient. Questions regarding the procedure were encouraged and answered. The patient understands and consents to the procedure. The epigastric region was prepped with Betadine in a sterile fashion, and a sterile drape was  applied covering the operative field. A sterile gown and sterile gloves were used for the procedure. Local anesthesia was provided with 1% Lidocaine. Ultrasound was used to localize liver lesions. Under direct ultrasound guidance, a 17 gauge needle was advanced into the left lobe of the liver. Three separate core biopsy samples were obtained and submitted in formalin. The outer needle was removed and additional ultrasound performed. COMPLICATIONS: None. FINDINGS: Multiple small lesions are identified throughout the liver which are predominantly mildly hypoechoic relative to adjacent liver parenchyma. A lesion in the lateral segment of the left lobe of the liver was chosen for sampling and measures approximately 1.2 x 1.7 cm. Solid tissue was obtained. IMPRESSION: Ultrasound-guided core biopsy performed of one of several lesions in the liver. A lesion measuring 1.7 cm in greatest diameter within the left lobe of the liver was sampled. Breast prognostic studies were requested. Electronically Signed   By: Aletta Edouard M.D.   On: 02/05/2015 13:39    ASSESSMENT: 75 y.o. [place] woman:   (1) Status post right breast biopsy in 05/2009 for a grade 2 invasive ductal carcinoma, T2 NX M1, Stage IV, strongly estrogen and progesterone receptor-positive, HER-2-negative with an MIB-1 of 26%.  (2) Neoadjuvantly she received Letrozole and zoledronic acid beginning in June of 2011 and underwent right modified radical mastectomy February of 2012 for a ypT2 ypN2, grade 1 invasive ductal carcinoma with negative margins.   (3) She completed radiation therapy in August of 2012.   (4) She has continued on Letrozole but was switched from Zoledronic acid to Denosumab Delton See) because of concerns regarding her serum creatinine. Delton See was being given every 8 weeks.  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  (6) symptomatic anemia, with creatinine clearance < 60 cc/min; Darbepoietin Q14d started 11/03/2011; received Feraheme 01/05/2012; ferritin August 2016 was greater than 800  (7) On subcutaneous B-12 supplementation monthly.  (8) Pain from osseous metastasis, osteoarthritis, tendinopathy, and bursitis as confirmed by recent MRI.  (9) Anemia, multifactorial with renal disease, on Aranesp monthly, increased to every 2 weeks June 2016  (10) letrozole was discontinued in September 2014 with evidence of disease progression. She was started on fulvestrant injections, first given on 10/17/2012; stopped 06/03/2014 with progression  (11) exemestane and everolimus started 07/08/2014 (a) everolimus discontinued August 2016 with pneumonitis (b) exemestane held at the start of everolimus January 2017  (12) liver biopsy 02/05/2015 confirms metastatic breast cancer to the liver, estrogen and progesterone receptor positive, HER-2 negative  (13) eribulin started 02/17/2015, to be given days 1  and 8 of each 21 day cycle, with neulasta day 9   PLAN: Gennesis tolerated her eribulin well and is not currently neutropenic. She should respond to supportive measures and antibiotics as you are doing. The cause of her syncope is likely multifactorial and she has multiple comorbid problems which may have contributed. This includes anemia (she is on aranesp and receives transfusions PRN; also on B-12 supplementation). Her BP medications may also have contributed. Chemotherapy can affect the autonomic nervous system and not uncommonly causes hypotension and/or orthostasis.  She already has an appointment at the [facility] 03/10/2015 at 10 AM. That should be the start of her second cycle of eribulin but we will consider postponing treatment one week depending on how she is doing at the time. I do not think her WBC will fall further as she is beyond the usual nadir point. Agree with her being  full code at this point. Anticipate she may be going home by this weekend.  Please let me know if we can be of further help. Appreciate your excellent care to this patient! Chauncey Cruel, MD   03/03/2015 12:31 PM Medical Oncology and Hematology [facility] [address] [place], [place] [address] Tel. [phone]    Fax. [phone]

## 2015-03-03 NOTE — Progress Notes (Signed)
Dr Rama notifed of CBG of 37 this am. Treated with orange juice. Rechecked q15 minutes  X 2 and rose to 102.

## 2015-03-04 LAB — GLUCOSE, CAPILLARY
GLUCOSE-CAPILLARY: 135 mg/dL — AB (ref 65–99)
GLUCOSE-CAPILLARY: 140 mg/dL — AB (ref 65–99)
Glucose-Capillary: 163 mg/dL — ABNORMAL HIGH (ref 65–99)
Glucose-Capillary: 150 mg/dL — ABNORMAL HIGH (ref 65–99)

## 2015-03-04 LAB — BASIC METABOLIC PANEL
Anion gap: 11 (ref 5–15)
BUN: 40 mg/dL — AB (ref 6–20)
CALCIUM: 6.5 mg/dL — AB (ref 8.9–10.3)
CO2: 20 mmol/L — AB (ref 22–32)
Chloride: 105 mmol/L (ref 101–111)
Creatinine, Ser: 1.68 mg/dL — ABNORMAL HIGH (ref 0.44–1.00)
GFR calc Af Amer: 34 mL/min — ABNORMAL LOW (ref >60)
GFR, EST NON AFRICAN AMERICAN: 29 mL/min — AB (ref >60)
GLUCOSE: 120 mg/dL — AB (ref 65–99)
Potassium: 4.9 mmol/L (ref 3.5–5.1)
Sodium: 136 mmol/L (ref 135–145)

## 2015-03-04 LAB — CBC
HCT: 26.6 % — ABNORMAL LOW (ref 36.0–46.0)
Hemoglobin: 8.3 g/dL — ABNORMAL LOW (ref 12.0–15.0)
MCH: 29.3 pg (ref 26.0–34.0)
MCHC: 31.2 g/dL (ref 30.0–36.0)
MCV: 94 fL (ref 78.0–100.0)
Platelets: 140 10*3/uL — ABNORMAL LOW (ref 150–400)
RBC: 2.83 MIL/uL — ABNORMAL LOW (ref 3.87–5.11)
RDW: 16.7 % — AB (ref 11.5–15.5)
WBC: 5.5 10*3/uL (ref 4.0–10.5)

## 2015-03-04 LAB — TYPE AND SCREEN
ABO/RH(D): O NEG
ANTIBODY SCREEN: NEGATIVE
UNIT DIVISION: 0

## 2015-03-04 MED ORDER — ENOXAPARIN SODIUM 30 MG/0.3ML ~~LOC~~ SOLN
30.0000 mg | SUBCUTANEOUS | Status: DC
  Administered 2015-03-04: 30 mg via SUBCUTANEOUS
  Filled 2015-03-04: qty 0.3

## 2015-03-04 NOTE — Progress Notes (Addendum)
Progress Note   Brittney Cunningham [id number] DOB: 11-Mar-1940 DOA: 03/02/2015 PCP: [name], MD   Brief Narrative:   75 y.o. female with a PMH of stage IV right breast cancer diagnosed May/2011 with metastasis to the bone and liver status post neoadjuvant therapy, right modified radical mastectomy 03/2010 and radiation treatment, under the care of Dr. [name] who is currently treating her for progressive disease with eribulin with last dose given 02/24/15 followed by Neulasta support, who was admitted 03/02/15 with dizziness, fatigue and upper respiratory symptoms. Upon initial presentation, she was found to be tachycardic, tachypneic with a fever of 100.8. She was admitted for possible sepsis.  Assessment/Plan:   Principal Problem:  Sepsis ([place]) - Sepsis order set utilized. - Source thought to be from HCAP. Influenza neg. respiratory virus panel pending. - Status post fluid volume resuscitation. The pressure stable. - Continue empiric Vanc/Zosyn. Plan to transition to oral ABX in AM  Active Problems:   Irregular heart sounds - tachycardic but overall stable     Elevated troponin secondary to demand ischemia - in the setting of sepsis - no chest pain  - no need for further testing of troponins    Thrombocytopenia - reactive - CBC in AM   Hypothyroidism - Continue Synthroid.   Essential hypertension - Continue Coreg as blood pressure can tolerate.   Breast cancer metastasized to bone [facility]) and liver - Under the care of Dr. [name]. Status post chemotherapy 02/24/15 followed by Neulasta support.   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   Diabetes mellitus with complication (HCC) /  hypoglycemia - Hold Levemir, glucose 65 this morning. Add dextrose to IVF. - Use insulin sensitive SSI for now.   Anemia of chronic renal failure, stage 4 (severe) (HCC) - Hemoglobin 6.9 mg/dL. we'll give 1 unit of PRBCs this morning. - Baseline creatinine 0.97. Current creatinine elevated over usual baseline values. Gently hydrate.   Pain from bone metastases (HCC) - Continue MS Contin and MSIR for breakthrough pain.   DVT prophylaxis - Lovenox ordered.   Family Communication/Anticipated D/C date and plan/Code Status   Family Communication: no family present this a.m. Disposition Plan: Home when sepsis resolved. Anticipated D/C date:   03/06/15. Code Status: Full code.  IV Access:    Peripheral IV   Procedures and diagnostic studies:   Dg Chest 2 View 03/02/2015 Chronic RIGHT basilar atelectasis. No definite acute pulmonary abnormalities. Diffuse osseous metastatic disease.   Medical Consultants:    None.  Anti-Infectives:   Vancomycin 03/02/15---> Zosyn 03/02/15--->  Subjective:   No complaints of pain, N/V/D.  Objective:    Filed Vitals:   03/04/15 0800 03/04/15 1200 03/04/15 1600 03/04/15 1620  BP: 113/41 105/51  134/52  Pulse:      Temp: 97.8 F (36.6 C) 98.3 F (36.8 C) 97.8 F (36.6 C)   TempSrc: Oral Oral Oral   Resp: 18 21  27   Height:      Weight:      SpO2: 99% 93%  99%    Intake/Output Summary (Last 24 hours) at 03/04/15 1717 Last data filed at 03/04/15 1600  Gross per 24 hour  Intake   3192 ml  Output  350 ml  Net   2842 ml   Filed Weights   03/02/15 1103  Weight: 72.122 kg (159 lb)    Exam: Gen:  NAD Cardiovascular:  Tachy, irregular at times Respiratory:  Lungs course Gastrointestinal:  Abdomen soft, NT/ND, + BS Extremities:  Trace edema   Data Reviewed:    Labs: Basic Metabolic Panel:  Recent Labs Lab 03/02/15 1120 03/02/15 1654 03/03/15 0405 03/04/15 0439  NA 132*  --  138 136  K 4.0  --  3.4* 4.9  CL  95*  --  104 105  CO2 21*  --  24 20*  GLUCOSE 193*  --  65 120*  BUN 45*  --  43* 40*  CREATININE 1.48* 1.36* 1.45* 1.68*  CALCIUM 7.6*  --  6.7* 6.5*   Liver Function Tests:  Recent Labs Lab 03/02/15 1120  AST 28  ALT 11*  ALKPHOS 94  BILITOT 1.4*  PROT 6.3*  ALBUMIN 2.9*   Coagulation profile  Recent Labs Lab 03/02/15 1654  INR 1.34    CBC:  Recent Labs Lab 03/02/15 1120 03/02/15 1654 03/03/15 0405 03/04/15 0439  WBC 6.0 3.9* 5.0 5.5  NEUTROABS 3.9  --   --   --   HGB 8.9* 7.5* 6.9* 8.3*  HCT 29.0* 23.7* 22.0* 26.6*  MCV 92.9 92.9 94.4 94.0  PLT 192 161 158 140*   Sepsis Labs:  Recent Labs Lab 03/02/15 1120 03/02/15 1136 03/02/15 1459 03/02/15 1654 03/02/15 2154 03/03/15 0405 03/04/15 0439  PROCALCITON  --   --   --  2.93  --   --   --   WBC 6.0  --   --  3.9*  --  5.0 5.5  LATICACIDVEN  --  4.17* 2.26*  --  2.5*  --   --    Microbiology Recent Results (from the past 240 hour(s))  Culture, blood (routine x 2)     Status: None (Preliminary result)   Collection Time: 03/02/15 11:20 AM  Result Value Ref Range Status   Specimen Description BLOOD LEFT ANTECUBITAL  Final   Special Requests BOTTLES DRAWN AEROBIC AND ANAEROBIC 5CC EACH  Final   Culture   Final    NO GROWTH 2 DAYS Performed at [facility]    Report Status PENDING  Incomplete  Culture, blood (routine x 2)     Status: None (Preliminary result)   Collection Time: 03/02/15 11:50 AM  Result Value Ref Range Status   Specimen Description BLOOD RIGHT PORTA CATH  Final   Special Requests BOTTLES DRAWN AEROBIC AND ANAEROBIC 10CC EACH  Final   Culture   Final    NO GROWTH 2 DAYS Performed at [facility]    Report Status PENDING  Incomplete  MRSA PCR Screening     Status: None   Collection Time: 03/02/15  4:12 PM  Result Value Ref Range Status   MRSA by PCR NEGATIVE NEGATIVE Final    Comment:        The GeneXpert MRSA Assay (FDA approved for NASAL specimens only), is  one component of a comprehensive MRSA colonization surveillance program. It is not intended to diagnose MRSA infection nor to guide or monitor treatment for MRSA infections.   Urine culture     Status: None (Preliminary result)   Collection Time: 03/02/15  9:57 PM  Result Value Ref Range Status   Specimen Description URINE, CLEAN CATCH  Final   Special Requests NONE  Final   Culture  Final    40,000 COLONIES/ml ESCHERICHIA COLI Performed at [facility]    Report Status PENDING  Incomplete     Medications:   . aspirin  81 mg Oral Daily  . atorvastatin  80 mg Oral Daily  . calcium carbonate  1 tablet Oral TID WC  . carvedilol  12.5 mg Oral BID WC  . clopidogrel  75 mg Oral Daily  . cyanocobalamin  1,000 mcg Intramuscular Q30 days  . enoxaparin (LOVENOX) injection  30 mg Subcutaneous Q24H  . gabapentin  100 mg Oral TID  . guaiFENesin  600 mg Oral BID  . insulin aspart  0-5 Units Subcutaneous QHS  . insulin aspart  0-9 Units Subcutaneous TID WC  . levothyroxine  150 mcg Oral QAC breakfast  . mirtazapine  15 mg Oral QHS  . morphine  30 mg Oral TID  . pantoprazole  40 mg Oral Daily  . piperacillin-tazobactam (ZOSYN)  IV  3.375 g Intravenous Q8H  . senna-docusate  1 tablet Oral QHS  . vancomycin  1,000 mg Intravenous Q24H   Continuous Infusions: . dextrose 5 % and 0.9 % NaCl with KCl 40 mEq/L 100 mL/hr at 03/04/15 0905    Time spent: 35 minutes.  The patient is medically complex with multiple co-morbidities and is at high risk for clinical deterioration and requires high complexity decision making.   LOS: 2 days   Faye Ramsay  [facility] Pager [phone]. If unable to reach me by pager, please call my cell phone at [phone]  Please refer to [email or web address], password [email or web address] to get updated schedule on who will round on this patient, as hospitalists switch teams weekly. If 7PM-7AM, please contact night-coverage at [email or web address], password [email or web address] for any  overnight needs.  03/04/2015, 5:17 PM

## 2015-03-05 DIAGNOSIS — L8990 Pressure ulcer of unspecified site, unspecified stage: Secondary | ICD-10-CM | POA: Diagnosis present

## 2015-03-05 LAB — URINE CULTURE: Culture: 40000

## 2015-03-05 LAB — CBC
HCT: 25.5 % — ABNORMAL LOW (ref 36.0–46.0)
Hemoglobin: 7.8 g/dL — ABNORMAL LOW (ref 12.0–15.0)
MCH: 29.1 pg (ref 26.0–34.0)
MCHC: 30.6 g/dL (ref 30.0–36.0)
MCV: 95.1 fL (ref 78.0–100.0)
PLATELETS: 118 10*3/uL — AB (ref 150–400)
RBC: 2.68 MIL/uL — AB (ref 3.87–5.11)
RDW: 17 % — ABNORMAL HIGH (ref 11.5–15.5)
WBC: 7.2 10*3/uL (ref 4.0–10.5)

## 2015-03-05 LAB — BASIC METABOLIC PANEL
ANION GAP: 9 (ref 5–15)
Anion gap: 11 (ref 5–15)
BUN: 35 mg/dL — AB (ref 6–20)
BUN: 35 mg/dL — ABNORMAL HIGH (ref 6–20)
CHLORIDE: 107 mmol/L (ref 101–111)
CO2: 19 mmol/L — ABNORMAL LOW (ref 22–32)
CO2: 18 mmol/L — AB (ref 22–32)
CREATININE: 1.62 mg/dL — AB (ref 0.44–1.00)
Calcium: 6.4 mg/dL — CL (ref 8.9–10.3)
Calcium: 6.8 mg/dL — ABNORMAL LOW (ref 8.9–10.3)
Chloride: 108 mmol/L (ref 101–111)
Creatinine, Ser: 1.6 mg/dL — ABNORMAL HIGH (ref 0.44–1.00)
GFR calc Af Amer: 36 mL/min — ABNORMAL LOW (ref >60)
GFR calc non Af Amer: 31 mL/min — ABNORMAL LOW (ref >60)
GFR, EST AFRICAN AMERICAN: 36 mL/min — AB (ref >60)
GFR, EST NON AFRICAN AMERICAN: 31 mL/min — AB (ref >60)
Glucose, Bld: 119 mg/dL — ABNORMAL HIGH (ref 65–99)
Glucose, Bld: 153 mg/dL — ABNORMAL HIGH (ref 65–99)
POTASSIUM: 5.5 mmol/L — AB (ref 3.5–5.1)
POTASSIUM: 5.4 mmol/L — AB (ref 3.5–5.1)
SODIUM: 136 mmol/L (ref 135–145)
Sodium: 136 mmol/L (ref 135–145)

## 2015-03-05 LAB — RESPIRATORY VIRUS PANEL
Adenovirus: NEGATIVE
INFLUENZA B 1: NEGATIVE
Influenza A: NEGATIVE
METAPNEUMOVIRUS: NEGATIVE
PARAINFLUENZA 2 A: NEGATIVE
PARAINFLUENZA 3 A: NEGATIVE
Parainfluenza 1: NEGATIVE
RESPIRATORY SYNCYTIAL VIRUS A: NEGATIVE
Respiratory Syncytial Virus B: NEGATIVE
Rhinovirus: NEGATIVE

## 2015-03-05 LAB — GLUCOSE, CAPILLARY
GLUCOSE-CAPILLARY: 88 mg/dL (ref 65–99)
Glucose-Capillary: 169 mg/dL — ABNORMAL HIGH (ref 65–99)
Glucose-Capillary: 122 mg/dL — ABNORMAL HIGH (ref 65–99)
Glucose-Capillary: 123 mg/dL — ABNORMAL HIGH (ref 65–99)

## 2015-03-05 LAB — MAGNESIUM: MAGNESIUM: 1.4 mg/dL — AB (ref 1.7–2.4)

## 2015-03-05 LAB — LACTIC ACID, PLASMA: Lactic Acid, Venous: 2.2 mmol/L (ref 0.5–2.0)

## 2015-03-05 MED ORDER — MAGNESIUM SULFATE 2 GM/50ML IV SOLN
2.0000 g | Freq: Once | INTRAVENOUS | Status: AC
  Administered 2015-03-05: 2 g via INTRAVENOUS
  Filled 2015-03-05: qty 50

## 2015-03-05 MED ORDER — SODIUM CHLORIDE 0.9 % IV BOLUS (SEPSIS)
1000.0000 mL | Freq: Once | INTRAVENOUS | Status: AC
  Administered 2015-03-05: 1000 mL via INTRAVENOUS

## 2015-03-05 MED ORDER — SODIUM CHLORIDE 0.9% FLUSH
10.0000 mL | Freq: Two times a day (BID) | INTRAVENOUS | Status: DC
  Administered 2015-03-07 – 2015-03-22 (×12): 10 mL

## 2015-03-05 MED ORDER — SODIUM CHLORIDE 0.9% FLUSH
10.0000 mL | INTRAVENOUS | Status: DC | PRN
  Administered 2015-03-09 – 2015-03-25 (×7): 10 mL
  Filled 2015-03-05 (×7): qty 40

## 2015-03-05 MED ORDER — SODIUM CHLORIDE 0.9 % IV SOLN
1.0000 g | Freq: Once | INTRAVENOUS | Status: AC
  Administered 2015-03-05: 1 g via INTRAVENOUS
  Filled 2015-03-05: qty 10

## 2015-03-05 MED ORDER — SODIUM CHLORIDE 0.9 % IV SOLN
250.0000 mg | Freq: Four times a day (QID) | INTRAVENOUS | Status: DC
  Administered 2015-03-05 – 2015-03-06 (×4): 250 mg via INTRAVENOUS
  Filled 2015-03-05 (×5): qty 250

## 2015-03-05 NOTE — Progress Notes (Signed)
Pharmacy Antibiotic Follow-up Note  Brittney Cunningham is a 75 y.o. year-old female admitted on 03/02/2015.  The patient is currently on day 4 of vancomycin and zosyn for sepsis, HCAP.  Assessment/Plan:  RPh spoke with Dr. Doyle Askew this morning, plan is to transition to oral abx after she evaluates patient this afternoon, therefore, will defer checking vancomycin trough and continue 1g IV q24h for now and Zosyn 3.375gm IV q8h (4hr extended infusions) continued  BUT, now changing vanc/Zosyn to Primaxin per pharmacy dosing. Will still recommend the below.  Start Primaxin 250mg  IV q6 based on current weight and N CrCl   If patient is symptomatic, suggest treating ESBL in urine with fosfomycin 3g PO x 1 dose  Temp (24hrs), Avg:97.6 F (36.4 C), Min:97.4 F (36.3 C), Max:97.9 F (36.6 C)   Recent Labs Lab 03/02/15 1120 03/02/15 1654 03/03/15 0405 03/04/15 0439 03/05/15 0515  WBC 6.0 3.9* 5.0 5.5 7.2     Recent Labs Lab 03/02/15 1120 03/02/15 1654 03/03/15 0405 03/04/15 0439 03/05/15 0515  CREATININE 1.48* 1.36* 1.45* 1.68* 1.60*   Estimated Creatinine Clearance: 30.3 mL/min (by C-G formula based on Cr of 1.6).    Allergies  Allergen Reactions  . Chlorhexidine Hives, Itching and Rash    This was most likely a CONTACT DERMATITIS versus true systemic allergic reaction  . Adhesive [Tape] Hives  . Codeine Swelling    Antimicrobials this admission: 1/31 >> Vancomycin >> 2/3 1/31 >> Zosyn >> 2/3 2/3 >> Primaxin >>  Levels/dose changes this admission: ---  Microbiology results: 1/31BCx: ngtd 1/31 UCx: 40k E.coli (+) ESBL 1/31 Resp virus panel: neg 1/31 MRSA PCR: negative 1/31 Influenza PCR: negative   Adrian Saran, PharmD, BCPS Pager [phone] 03/05/2015 5:22 PM

## 2015-03-05 NOTE — Progress Notes (Signed)
CRITICAL VALUE ALERT  Critical value received:  Calcium 6.4  Date of notification:  03/05/2015  Time of notification: 0614  Critical value read back:Yes.    Nurse who received alert:  Chana Bode, RN  MD notified (1st page):  Pincus Sanes, PA  Time of first page:  [phone]  MD notified (2nd page):  Time of second page:  Responding MD:  Pincus Sanes, PA  Time MD responded:  [phone]  CRITICAL VALUE ALERT  Critical value received:  Lactic acid 2.2  Date of notification: 03/05/2015  Time of notification: 0614  Critical value read back:Yes.    Nurse who received alert:  Chana Bode, RN  MD notified (1st page):  Pincus Sanes, PA  Time of first page:  [phone]  MD notified (2nd page):  Time of second page:  Responding MD:  Pincus Sanes, PA  Time MD responded: [phone]

## 2015-03-05 NOTE — Progress Notes (Signed)
Pharmacy Antibiotic Follow-up Note  Brittney Cunningham is a 75 y.o. year-old female admitted on 03/02/2015.  The patient is currently on day 4 of vancomycin and zosyn for sepsis, HCAP.  Assessment/Plan:  Spoke with Dr. Doyle Askew this morning, plan is to transition to oral abx after she evaluates patient this afternoon, therefore, will defer checking vancomycin trough and continue 1g IV q24h for now  Continue Zosyn 3.375gm IV q8h (4hr extended infusions)  If patient is symptomatic, suggest treating ESBL in urine with fosfomycin 3g PO x 1 dose  Temp (24hrs), Avg:97.6 F (36.4 C), Min:97.4 F (36.3 C), Max:97.9 F (36.6 C)   Recent Labs Lab 03/02/15 1120 03/02/15 1654 03/03/15 0405 03/04/15 0439 03/05/15 0515  WBC 6.0 3.9* 5.0 5.5 7.2    Recent Labs Lab 03/02/15 1120 03/02/15 1654 03/03/15 0405 03/04/15 0439 03/05/15 0515  CREATININE 1.48* 1.36* 1.45* 1.68* 1.60*   Estimated Creatinine Clearance: 30.3 mL/min (by C-G formula based on Cr of 1.6).    Allergies  Allergen Reactions  . Chlorhexidine Hives, Itching and Rash    This was most likely a CONTACT DERMATITIS versus true systemic allergic reaction  . Adhesive [Tape] Hives  . Codeine Swelling    Antimicrobials this admission: 1/31 >> Vancomycin >> 1/31 >> Zosyn >>  Levels/dose changes this admission: ---  Microbiology results: 1/31BCx: ngtd 1/31 UCx: 40k E.coli (+) ESBL 1/31 Resp virus panel: neg 1/31 MRSA PCR: negative 1/31 Influenza PCR: negative  Thank you for allowing pharmacy to be a part of this patient's care.  Peggyann Juba, PharmD, BCPS Pager: [phone]  03/05/2015 3:26 PM

## 2015-03-05 NOTE — Progress Notes (Signed)
Progress Note   NAKSHATRA KETTINGER [id number] DOB: 1940-03-17 DOA: 03/02/2015 PCP: [name], MD   Brief Narrative:   75 y.o. female with a PMH of stage IV right breast cancer diagnosed May/2011 with metastasis to the bone and liver status post neoadjuvant therapy, right modified radical mastectomy 03/2010 and radiation treatment, under the care of Dr. [name] who is currently treating her for progressive disease with eribulin with last dose given 02/24/15 followed by Neulasta support, who was admitted 03/02/15 with dizziness, fatigue and upper respiratory symptoms. Upon initial presentation, she was found to be tachycardic, tachypneic with a fever of 100.8. She was admitted for possible sepsis.  Assessment/Plan:   Principal Problem:  Sepsis ([place]) - Sepsis order set utilized. - Source thought to be from HCAP. Influenza neg. respiratory virus panel negative, urine culture with E. Coli  And MDR - Status post fluid volume resuscitation. The pressure stable. - change abx to primaxin to target MDR E. Coli  Active Problems:   Irregular heart sounds - tachycardic but overall stable  - stable for transfer to tele     Elevated troponin secondary to demand ischemia - in the setting of sepsis - no chest pain  - no need for further testing of troponins    Thrombocytopenia - stop all heparin products - place on SCD's for DVT prophylaxis  - CBC in AM   Hypothyroidism - Continue Synthroid.   Essential hypertension - Continue Coreg as blood pressure can tolerate.   Breast cancer metastasized to bone [facility]) and liver - Under the care of Dr. [name]. Status post chemotherapy 02/24/15 followed by Neulasta support.   Mixed collagen vascular disease (HCC)    Chronic kidney disease, stage II - III - with last baseline Cr 1.4 - 1.7 in the past several months - monitor with BMP    Hypokalemia - supplemented and now above target range, stop supplementing - BMP in AM   Hypomagnesemia and hypocalcemia  - supplement and repeat Mg and Ca in AM   CAD (coronary artery disease), native coronary artery - Continue Lipitor and aspirin/Plavix.   Diabetes mellitus with complication (HCC) / hypoglycemia - Hold Levemir, glucose 65 this morning. Add dextrose to IVF. - Use insulin sensitive SSI for now.   Anemia of chronic renal failure, stage 4 (severe) (HCC) - Has required unit of blood transfusion while inpatient - no signs of active bleeding - CBC in AM   Pain from bone metastases (HCC) - Continue MS Contin and MSIR for breakthrough pain.   DVT prophylaxis - SCD's  Family Communication/Anticipated D/C date and plan/Code Status   Family Communication: no family present this a.m. Disposition Plan: Home when sepsis resolved. Anticipated D/C date:   03/07/15. Code Status: Full code.  IV Access:    Peripheral IV   Procedures and diagnostic studies:   Dg Chest 2 View 03/02/2015 Chronic RIGHT basilar atelectasis. No definite acute pulmonary abnormalities. Diffuse osseous metastatic disease.   Medical Consultants:    None.  Anti-Infectives:   Vancomycin 03/02/15---> 2/3 Zosyn 03/02/15---> 2/3 Primaxin 2/3 -->  Subjective:   No complaints of pain, N/V/D.  Objective:    Filed Vitals:   03/05/15 0905 03/05/15 1200 03/05/15 1225 03/05/15 1600  BP: 102/46  111/79   Pulse:      Temp:  97.4 F (36.3 C)  97.6 F (36.4 C)  TempSrc:  Oral  Oral  Resp: 27  24   Height:      Weight:  SpO2: 95%  92%     Intake/Output Summary (Last 24 hours) at 03/05/15 1641 Last data filed at 03/05/15 1356  Gross per 24 hour  Intake    710 ml  Output    275 ml  Net    435 ml   Filed Weights   03/02/15 1103  Weight: 72.122 kg (159 lb)    Exam: Gen:  NAD Cardiovascular:  Tachy, irregular at times Respiratory:  Lungs course Gastrointestinal:  Abdomen soft, NT/ND, + BS Extremities:  Trace edema   Data Reviewed:    Labs: Basic Metabolic  Panel:  Recent Labs Lab 03/02/15 1120 03/02/15 1654 03/03/15 0405 03/04/15 0439 03/05/15 0515  NA 132*  --  138 136 136  K 4.0  --  3.4* 4.9 5.5*  CL 95*  --  104 105 108  CO2 21*  --  24 20* 19*  GLUCOSE 193*  --  65 120* 119*  BUN 45*  --  43* 40* 35*  CREATININE 1.48* 1.36* 1.45* 1.68* 1.60*  CALCIUM 7.6*  --  6.7* 6.5* 6.4*  MG  --   --   --   --  1.4*   Liver Function Tests:  Recent Labs Lab 03/02/15 1120  AST 28  ALT 11*  ALKPHOS 94  BILITOT 1.4*  PROT 6.3*  ALBUMIN 2.9*   Coagulation profile  Recent Labs Lab 03/02/15 1654  INR 1.34    CBC:  Recent Labs Lab 03/02/15 1120 03/02/15 1654 03/03/15 0405 03/04/15 0439 03/05/15 0515  WBC 6.0 3.9* 5.0 5.5 7.2  NEUTROABS 3.9  --   --   --   --   HGB 8.9* 7.5* 6.9* 8.3* 7.8*  HCT 29.0* 23.7* 22.0* 26.6* 25.5*  MCV 92.9 92.9 94.4 94.0 95.1  PLT 192 161 158 140* 118*   Sepsis Labs:  Recent Labs Lab 03/02/15 1136 03/02/15 1459 03/02/15 1654 03/02/15 2154 03/03/15 0405 03/04/15 0439 03/05/15 0515  PROCALCITON  --   --  2.93  --   --   --   --   WBC  --   --  3.9*  --  5.0 5.5 7.2  LATICACIDVEN 4.17* 2.26*  --  2.5*  --   --  2.2*   Microbiology Recent Results (from the past 240 hour(s))  Culture, blood (routine x 2)     Status: None (Preliminary result)   Collection Time: 03/02/15 11:20 AM  Result Value Ref Range Status   Specimen Description BLOOD LEFT ANTECUBITAL  Final   Special Requests BOTTLES DRAWN AEROBIC AND ANAEROBIC 5CC EACH  Final   Culture   Final    NO GROWTH 3 DAYS Performed at [facility]    Report Status PENDING  Incomplete  Culture, blood (routine x 2)     Status: None (Preliminary result)   Collection Time: 03/02/15 11:50 AM  Result Value Ref Range Status   Specimen Description BLOOD RIGHT PORTA CATH  Final   Special Requests BOTTLES DRAWN AEROBIC AND ANAEROBIC 10CC EACH  Final   Culture   Final    NO GROWTH 3 DAYS Performed at [facility]    Report  Status PENDING  Incomplete  MRSA PCR Screening     Status: None   Collection Time: 03/02/15  4:12 PM  Result Value Ref Range Status   MRSA by PCR NEGATIVE NEGATIVE Final    Comment:        The GeneXpert MRSA Assay (FDA approved for NASAL specimens only), is  one component of a comprehensive MRSA colonization surveillance program. It is not intended to diagnose MRSA infection nor to guide or monitor treatment for MRSA infections.   Respiratory virus panel     Status: None   Collection Time: 03/02/15  6:46 PM  Result Value Ref Range Status   Respiratory Syncytial Virus A Negative Negative Final   Respiratory Syncytial Virus B Negative Negative Final   Influenza A Negative Negative Final   Influenza B Negative Negative Final   Parainfluenza 1 Negative Negative Final   Parainfluenza 2 Negative Negative Final   Parainfluenza 3 Negative Negative Final   Metapneumovirus Negative Negative Final   Rhinovirus Negative Negative Final   Adenovirus Negative Negative Final    Comment: (NOTE) Performed At: [facility] [place], [place] [id number] Lindon Romp MD [id number]   Urine culture     Status: None   Collection Time: 03/02/15  9:57 PM  Result Value Ref Range Status   Specimen Description URINE, CLEAN CATCH  Final   Special Requests NONE  Final   Culture   Final    40,000 COLONIES/ml ESCHERICHIA COLI Confirmed Extended Spectrum Beta-Lactamase Producer (ESBL) Performed at [facility]    Report Status 03/05/2015 FINAL  Final   Organism ID, Bacteria ESCHERICHIA COLI  Final      Susceptibility   Escherichia coli - MIC*    AMPICILLIN >=32 RESISTANT Resistant     CEFAZOLIN >=64 RESISTANT Resistant     CEFTRIAXONE >=64 RESISTANT Resistant     CIPROFLOXACIN >=4 RESISTANT Resistant     GENTAMICIN <=1 SENSITIVE Sensitive     IMIPENEM <=0.25 SENSITIVE Sensitive     NITROFURANTOIN <=16 SENSITIVE Sensitive     TRIMETH/SULFA >=320 RESISTANT  Resistant     AMPICILLIN/SULBACTAM >=32 RESISTANT Resistant     PIP/TAZO 64 INTERMEDIATE Intermediate     * 40,000 COLONIES/ml ESCHERICHIA COLI     Medications:   . aspirin  81 mg Oral Daily  . atorvastatin  80 mg Oral Daily  . calcium carbonate  1 tablet Oral TID WC  . carvedilol  12.5 mg Oral BID WC  . clopidogrel  75 mg Oral Daily  . cyanocobalamin  1,000 mcg Intramuscular Q30 days  . gabapentin  100 mg Oral TID  . guaiFENesin  600 mg Oral BID  . insulin aspart  0-5 Units Subcutaneous QHS  . insulin aspart  0-9 Units Subcutaneous TID WC  . levothyroxine  150 mcg Oral QAC breakfast  . mirtazapine  15 mg Oral QHS  . morphine  30 mg Oral TID  . pantoprazole  40 mg Oral Daily  . piperacillin-tazobactam (ZOSYN)  IV  3.375 g Intravenous Q8H  . senna-docusate  1 tablet Oral QHS  . vancomycin  1,000 mg Intravenous Q24H   Continuous Infusions:    Time spent: 35 minutes.  The patient is medically complex with multiple co-morbidities and is at high risk for clinical deterioration and requires high complexity decision making.   LOS: 3 days   Faye Ramsay  [facility] Pager [phone]. If unable to reach me by pager, please call my cell phone at [phone]  Please refer to [email or web address], password [email or web address] to get updated schedule on who will round on this patient, as hospitalists switch teams weekly. If 7PM-7AM, please contact night-coverage at [email or web address], password [email or web address] for any overnight needs.  03/05/2015, 4:41 PM

## 2015-03-06 ENCOUNTER — Inpatient Hospital Stay (HOSPITAL_COMMUNITY): Payer: Medicare Other

## 2015-03-06 DIAGNOSIS — A4151 Sepsis due to Escherichia coli [E. coli]: Secondary | ICD-10-CM

## 2015-03-06 DIAGNOSIS — E162 Hypoglycemia, unspecified: Secondary | ICD-10-CM

## 2015-03-06 LAB — BASIC METABOLIC PANEL
ANION GAP: 11 (ref 5–15)
Anion gap: 10 (ref 5–15)
BUN: 35 mg/dL — ABNORMAL HIGH (ref 6–20)
BUN: 38 mg/dL — AB (ref 6–20)
CHLORIDE: 106 mmol/L (ref 101–111)
CO2: 18 mmol/L — ABNORMAL LOW (ref 22–32)
CO2: 20 mmol/L — ABNORMAL LOW (ref 22–32)
CREATININE: 1.68 mg/dL — AB (ref 0.44–1.00)
Calcium: 7 mg/dL — ABNORMAL LOW (ref 8.9–10.3)
Calcium: 7.3 mg/dL — ABNORMAL LOW (ref 8.9–10.3)
Chloride: 107 mmol/L (ref 101–111)
Creatinine, Ser: 1.7 mg/dL — ABNORMAL HIGH (ref 0.44–1.00)
GFR calc Af Amer: 34 mL/min — ABNORMAL LOW (ref >60)
GFR, EST AFRICAN AMERICAN: 34 mL/min — AB (ref >60)
GFR, EST NON AFRICAN AMERICAN: 29 mL/min — AB (ref >60)
GFR, EST NON AFRICAN AMERICAN: 29 mL/min — AB (ref >60)
GLUCOSE: 127 mg/dL — AB (ref 65–99)
Glucose, Bld: 161 mg/dL — ABNORMAL HIGH (ref 65–99)
POTASSIUM: 5.9 mmol/L — AB (ref 3.5–5.1)
Potassium: 5.1 mmol/L (ref 3.5–5.1)
SODIUM: 135 mmol/L (ref 135–145)
SODIUM: 137 mmol/L (ref 135–145)

## 2015-03-06 LAB — MAGNESIUM
MAGNESIUM: 1.8 mg/dL (ref 1.7–2.4)
MAGNESIUM: 1.8 mg/dL (ref 1.7–2.4)

## 2015-03-06 LAB — CBC
HCT: 27.6 % — ABNORMAL LOW (ref 36.0–46.0)
HEMOGLOBIN: 8.5 g/dL — AB (ref 12.0–15.0)
MCH: 29 pg (ref 26.0–34.0)
MCHC: 30.8 g/dL (ref 30.0–36.0)
MCV: 94.2 fL (ref 78.0–100.0)
Platelets: 114 10*3/uL — ABNORMAL LOW (ref 150–400)
RBC: 2.93 MIL/uL — ABNORMAL LOW (ref 3.87–5.11)
RDW: 16.9 % — AB (ref 11.5–15.5)
WBC: 8.7 10*3/uL (ref 4.0–10.5)

## 2015-03-06 LAB — GLUCOSE, CAPILLARY
GLUCOSE-CAPILLARY: 137 mg/dL — AB (ref 65–99)
GLUCOSE-CAPILLARY: 204 mg/dL — AB (ref 65–99)
GLUCOSE-CAPILLARY: 138 mg/dL — AB (ref 65–99)
GLUCOSE-CAPILLARY: 152 mg/dL — AB (ref 65–99)

## 2015-03-06 LAB — LACTIC ACID, PLASMA: LACTIC ACID, VENOUS: 2.3 mmol/L — AB (ref 0.5–2.0)

## 2015-03-06 MED ORDER — GUAIFENESIN-DM 100-10 MG/5ML PO SYRP: 5.0000 mL | ORAL_SOLUTION | ORAL | Status: DC | PRN

## 2015-03-06 MED ORDER — FOSFOMYCIN TROMETHAMINE 3 G PO PACK
3.0000 g | PACK | Freq: Once | ORAL | Status: AC
  Administered 2015-03-06: 3 g via ORAL
  Filled 2015-03-06: qty 3

## 2015-03-06 MED ORDER — POLYETHYLENE GLYCOL 3350 17 G PO PACK
17.0000 g | PACK | Freq: Every day | ORAL | Status: DC
  Administered 2015-03-06 – 2015-03-07 (×2): 17 g via ORAL
  Filled 2015-03-06 (×4): qty 1

## 2015-03-06 MED ORDER — DEXTROSE 5 % IV SOLN
500.0000 mg | INTRAVENOUS | Status: DC
  Administered 2015-03-06 – 2015-03-08 (×3): 500 mg via INTRAVENOUS
  Filled 2015-03-06 (×3): qty 500

## 2015-03-06 MED ORDER — DEXTROSE 5 % IV SOLN
1.0000 g | INTRAVENOUS | Status: DC
  Administered 2015-03-06 – 2015-03-09 (×4): 1 g via INTRAVENOUS
  Filled 2015-03-06 (×4): qty 10

## 2015-03-06 MED ORDER — SODIUM POLYSTYRENE SULFONATE 15 GM/60ML PO SUSP
30.0000 g | Freq: Once | ORAL | Status: AC
  Administered 2015-03-06: 30 g via ORAL
  Filled 2015-03-06: qty 120

## 2015-03-06 MED ORDER — IPRATROPIUM-ALBUTEROL 0.5-2.5 (3) MG/3ML IN SOLN: 3.0000 mL | RESPIRATORY_TRACT | Status: DC | PRN

## 2015-03-06 MED ORDER — SODIUM CHLORIDE 0.9 % IV SOLN
1.0000 g | Freq: Once | INTRAVENOUS | Status: AC
  Administered 2015-03-06: 1 g via INTRAVENOUS
  Filled 2015-03-06: qty 10

## 2015-03-06 NOTE — Progress Notes (Signed)
Pharmacy Antibiotic Follow-up Note  Brittney Cunningham is a 75 y.o. year-old female admitted on 03/02/2015.  The patient is currently on day 5 abx, day 2 of Primaxin for HCAP and ESBL E coli in urine..  Today, 03/06/2015: Temp: afebrile x 4d WBC: wnl, rising Renal: SCr rising; CrCl 30 CG  Assessment/Plan:  After discussion with Dr. Doyle Askew, will change Primaxin to Fosfomycin 3g PO x 1.  Generally one dose is adequate, but can be repeated up to 2 additional doses for complicated infections (note patient's concomitant stage IV breast cancer and last chemo 02/24/15)  MD unconvinced true pulmonary infection; will hold off on resuming abx for HCAP pending CT chest which MD will follow.  Temp (24hrs), Avg:97.5 F (36.4 C), Min:97.5 F (36.4 C), Max:97.6 F (36.4 C)   Recent Labs Lab 03/02/15 1654 03/03/15 0405 03/04/15 0439 03/05/15 0515 03/06/15 0500  WBC 3.9* 5.0 5.5 7.2 8.7     Recent Labs Lab 03/03/15 0405 03/04/15 0439 03/05/15 0515 03/05/15 1830 03/06/15 0500  CREATININE 1.45* 1.68* 1.60* 1.62* 1.70*   Estimated Creatinine Clearance: 29.9 mL/min (by C-G formula based on Cr of 1.7).    Allergies  Allergen Reactions  . Chlorhexidine Hives, Itching and Rash    This was most likely a CONTACT DERMATITIS versus true systemic allergic reaction  . Adhesive [Tape] Hives  . Codeine Swelling    Antimicrobials this admission: Vanc 1/31>> 2/3 Zosyn 1/31>> 2/3 Primaxin 2/3 >> 2/4 2/4 Fosfomycin 3g x 1  Levels/dose changes this admission: ---  Microbiology results: 1/31BCx: ngtd 1/31 UCx: 40k E.coli (+) ESBL 1/31 Resp virus panel: neg 1/31 MRSA PCR: negative 1/31 Influenza PCR: negative   Reuel Boom, PharmD, BCPS Pager: [phone] 03/06/2015, 2:44 PM

## 2015-03-06 NOTE — Progress Notes (Addendum)
Progress Note   Brittney Cunningham [id number] DOB: 1940/09/15 DOA: 03/02/2015 PCP: [name], MD   Brief Narrative:   75 y.o. female with a PMH of stage IV right breast cancer diagnosed May/2011 with metastasis to the bone and liver status post neoadjuvant therapy, right modified radical mastectomy 03/2010 and radiation treatment, under the care of Dr. [name] who is currently treating her for progressive disease with eribulin with last dose given 02/24/15 followed by Neulasta support, who was admitted 03/02/15 with dizziness, fatigue and upper respiratory symptoms. Upon initial presentation, she was found to be tachycardic, tachypneic with a fever of 100.8. She was admitted for possible sepsis.  Assessment/Plan:   Principal Problem:  Sepsis ([place]) - Source thought to be from HCAP. ESBL UTI - Status post fluid volume resuscitation. The pressure stable. - changed abx to primaxin to target ESBL in urine 2/3, continue dosing with pharmacy assistance  - CT chest pending to ensure no worsening PNA   Active Problems:   Irregular heart sounds - tachycardia resolved, pt asking to d/c tele - no chest pain this AM but worried about hyperK, would keep on tele for now - pt advised to let us know if she has any chest pain or shortness of breath  - keep electrolytes stable, K ~4, Mg ~2     Elevated troponin secondary to demand ischemia - in the setting of sepsis - no chest pain this AM - no need for further testing of troponins    Thrombocytopenia - stopped all heparin products 2/2 - SCD's for DVT prophylaxis  - Plt improving  - CBC in AM   Hypothyroidism - Continue Synthroid.   Essential hypertension - Continue Coreg as blood pressure can tolerate.   Breast cancer metastasized to bone [facility]) and liver - Under the care of Dr. [name]. Status post chemotherapy 02/24/15 followed by Neulasta support.   Mixed collagen vascular disease (HCC)    Chronic kidney disease, stage  II - III - with last baseline Cr 1.4 - 1.7 in the past several months - Cr slightly up in the past 48 hours but still at baseline  - monitor with BMP    Hyperkalemia  - give dose of Kayexalate - BMP in AM    Hypomagnesemia and hypocalcemia  - supplemented, Mg pending this AM - give additional dose of calcium gluconate    CAD (coronary artery disease), native coronary artery - Continue Lipitor and aspirin/Plavix.   Diabetes mellitus with complication (HCC) / hypoglycemia - Use insulin sensitive SSI for now until oral intake improves    Anemia of chronic renal failure, stage 4 (severe) (HCC) - Has required unit of blood transfusion while inpatient - no signs of active bleeding, Hg up from 7.8 --> 8.5 this AM - CBC in AM   Pain from bone metastases (HCC) - Continue MS Contin and MSIR for breakthrough pain.   DVT prophylaxis - SCD's  Family Communication/Anticipated D/C date and plan/Code Status   Family Communication: no family present this a.m. Disposition Plan: Home when sepsis resolved. Anticipated D/C date:   03/07/15. Code Status: Full code.  IV Access:    Peripheral IV  Procedures and diagnostic studies:    Dg Chest 2 View 03/02/2015 Chronic RIGHT basilar atelectasis. No acute pulmonary abnormalities. Diffuse osseous metastases.   Medical Consultants:    None.  Anti-Infectives:    Vancomycin 03/02/15---> 2/3  Zosyn 03/02/15---> 2/3  Primaxin 2/3 -->  Subjective:   No complaints of pain,  N/V/D.  Objective:  Reports feeling better.  Filed Vitals:   03/05/15 1632 03/05/15 1704 03/05/15 2152 03/06/15 0419  BP: 100/57 107/35 96/54 103/40  Pulse:  86 88 89  Temp:  97.5 F (36.4 C) 97.5 F (36.4 C) 97.5 F (36.4 C)  TempSrc:  Axillary Oral Axillary  Resp: 31 24 22 20   Height:      Weight:    79.6 kg (175 lb 7.8 oz)  SpO2: 90% 95% 93% 90%    Intake/Output Summary (Last 24 hours) at 03/06/15 1312 Last data filed at 03/06/15 1224  Gross  per 24 hour  Intake    780 ml  Output    800 ml  Net    -20 ml   Filed Weights   03/02/15 1103 03/06/15 0419  Weight: 72.122 kg (159 lb) 79.6 kg (175 lb 7.8 oz)    Exam: Gen:  NAD Cardiovascular:  Sinus rhythm, S1/S2 Respiratory:  Lungs course Gastrointestinal:  Abdomen soft, NT/ND, + BS Extremities:  Trace edema in LE   Data Reviewed:    Labs: Basic Metabolic Panel:  Recent Labs Lab 03/03/15 0405 03/04/15 0439 03/05/15 0515 03/05/15 1830 03/06/15 0500  NA 138 136 136 136 135  K 3.4* 4.9 5.5* 5.4* 5.9*  CL 104 105 108 107 106  CO2 24 20* 19* 18* 18*  GLUCOSE 65 120* 119* 153* 161*  BUN 43* 40* 35* 35* 35*  CREATININE 1.45* 1.68* 1.60* 1.62* 1.70*  CALCIUM 6.7* 6.5* 6.4* 6.8* 7.0*  MG  --   --  1.4*  --   --    Liver Function Tests:  Recent Labs Lab 03/02/15 1120  AST 28  ALT 11*  ALKPHOS 94  BILITOT 1.4*  PROT 6.3*  ALBUMIN 2.9*   Coagulation profile  Recent Labs Lab 03/02/15 1654  INR 1.34    CBC:  Recent Labs Lab 03/02/15 1120 03/02/15 1654 03/03/15 0405 03/04/15 0439 03/05/15 0515 03/06/15 0500  WBC 6.0 3.9* 5.0 5.5 7.2 8.7  NEUTROABS 3.9  --   --   --   --   --   HGB 8.9* 7.5* 6.9* 8.3* 7.8* 8.5*  HCT 29.0* 23.7* 22.0* 26.6* 25.5* 27.6*  MCV 92.9 92.9 94.4 94.0 95.1 94.2  PLT 192 161 158 140* 118* 114*   Sepsis Labs:  Recent Labs Lab 03/02/15 1136 03/02/15 1459 03/02/15 1654 03/02/15 2154 03/03/15 0405 03/04/15 0439 03/05/15 0515 03/06/15 0500  PROCALCITON  --   --  2.93  --   --   --   --   --   WBC  --   --  3.9*  --  5.0 5.5 7.2 8.7  LATICACIDVEN 4.17* 2.26*  --  2.5*  --   --  2.2*  --    Microbiology Recent Results (from the past 240 hour(s))  Culture, blood (routine x 2)     Status: None (Preliminary result)   Collection Time: 03/02/15 11:20 AM  Result Value Ref Range Status   Specimen Description BLOOD LEFT ANTECUBITAL  Final   Special Requests BOTTLES DRAWN AEROBIC AND ANAEROBIC 5CC EACH  Final   Culture    Final    NO GROWTH 3 DAYS Performed at [facility]    Report Status PENDING  Incomplete  Culture, blood (routine x 2)     Status: None (Preliminary result)   Collection Time: 03/02/15 11:50 AM  Result Value Ref Range Status   Specimen Description BLOOD RIGHT PORTA CATH  Final  Special Requests BOTTLES DRAWN AEROBIC AND ANAEROBIC 10CC EACH  Final   Culture   Final    NO GROWTH 3 DAYS Performed at [facility]    Report Status PENDING  Incomplete  MRSA PCR Screening     Status: None   Collection Time: 03/02/15  4:12 PM  Result Value Ref Range Status   MRSA by PCR NEGATIVE NEGATIVE Final    Comment:        The GeneXpert MRSA Assay (FDA approved for NASAL specimens only), is one component of a comprehensive MRSA colonization surveillance program. It is not intended to diagnose MRSA infection nor to guide or monitor treatment for MRSA infections.   Respiratory virus panel     Status: None   Collection Time: 03/02/15  6:46 PM  Result Value Ref Range Status   Respiratory Syncytial Virus A Negative Negative Final   Respiratory Syncytial Virus B Negative Negative Final   Influenza A Negative Negative Final   Influenza B Negative Negative Final   Parainfluenza 1 Negative Negative Final   Parainfluenza 2 Negative Negative Final   Parainfluenza 3 Negative Negative Final   Metapneumovirus Negative Negative Final   Rhinovirus Negative Negative Final   Adenovirus Negative Negative Final    Comment: (NOTE) Performed At: [facility] [place], [place] [id number] Lindon Romp MD [id number]   Urine culture     Status: None   Collection Time: 03/02/15  9:57 PM  Result Value Ref Range Status   Specimen Description URINE, CLEAN CATCH  Final   Special Requests NONE  Final   Culture   Final    40,000 COLONIES/ml ESCHERICHIA COLI Confirmed Extended Spectrum Beta-Lactamase Producer (ESBL) Performed at [facility]    Report Status  03/05/2015 FINAL  Final   Organism ID, Bacteria ESCHERICHIA COLI  Final      Susceptibility   Escherichia coli - MIC*    AMPICILLIN >=32 RESISTANT Resistant     CEFAZOLIN >=64 RESISTANT Resistant     CEFTRIAXONE >=64 RESISTANT Resistant     CIPROFLOXACIN >=4 RESISTANT Resistant     GENTAMICIN <=1 SENSITIVE Sensitive     IMIPENEM <=0.25 SENSITIVE Sensitive     NITROFURANTOIN <=16 SENSITIVE Sensitive     TRIMETH/SULFA >=320 RESISTANT Resistant     AMPICILLIN/SULBACTAM >=32 RESISTANT Resistant     PIP/TAZO 64 INTERMEDIATE Intermediate     * 40,000 COLONIES/ml ESCHERICHIA COLI     Medications:   . aspirin  81 mg Oral Daily  . atorvastatin  80 mg Oral Daily  . calcium carbonate  1 tablet Oral TID WC  . carvedilol  12.5 mg Oral BID WC  . clopidogrel  75 mg Oral Daily  . cyanocobalamin  1,000 mcg Intramuscular Q30 days  . gabapentin  100 mg Oral TID  . guaiFENesin  600 mg Oral BID  . imipenem-cilastatin  250 mg Intravenous 4 times per day  . insulin aspart  0-5 Units Subcutaneous QHS  . insulin aspart  0-9 Units Subcutaneous TID WC  . levothyroxine  150 mcg Oral QAC breakfast  . mirtazapine  15 mg Oral QHS  . morphine  30 mg Oral TID  . pantoprazole  40 mg Oral Daily  . senna-docusate  1 tablet Oral QHS  . sodium chloride flush  10-40 mL Intracatheter Q12H   Continuous Infusions:    Time spent: 35 minutes.  The patient is medically complex with multiple co-morbidities and is at high risk for clinical deterioration  and requires high complexity decision making.   LOS: 4 days   Faye Ramsay  [facility] Pager [phone]. If unable to reach me by pager, please call my cell phone at [phone]  Please refer to [email or web address], password [email or web address] to get updated schedule on who will round on this patient, as hospitalists switch teams weekly. If 7PM-7AM, please contact night-coverage at [email or web address], password [email or web address] for any overnight needs.  03/06/2015, 1:12 PM

## 2015-03-06 NOTE — Progress Notes (Signed)
CRITICAL VALUE ALERT  Critical value received: Lactic acd 2.3  Date of notification:  2/4./2016  Time of notification:  2050  Critical value read back:Yes.    Nurse who received alert:  J.Winslow Ederer  MD notified (1st page):  M.Lynch  Time of first page:  2051  MD notified (2nd page):  Time of second page:  Responding MD:  M.Lynch  Time MD responded:  2052    No New orders

## 2015-03-07 LAB — CBC
HEMATOCRIT: 25.3 % — AB (ref 36.0–46.0)
Hemoglobin: 7.8 g/dL — ABNORMAL LOW (ref 12.0–15.0)
MCH: 28.6 pg (ref 26.0–34.0)
MCHC: 30.8 g/dL (ref 30.0–36.0)
MCV: 92.7 fL (ref 78.0–100.0)
Platelets: 97 10*3/uL — ABNORMAL LOW (ref 150–400)
RBC: 2.73 MIL/uL — ABNORMAL LOW (ref 3.87–5.11)
RDW: 16.6 % — AB (ref 11.5–15.5)
WBC: 7 10*3/uL (ref 4.0–10.5)

## 2015-03-07 LAB — CULTURE, BLOOD (ROUTINE X 2)
CULTURE: NO GROWTH
CULTURE: NO GROWTH

## 2015-03-07 LAB — BASIC METABOLIC PANEL
Anion gap: 10 (ref 5–15)
BUN: 37 mg/dL — ABNORMAL HIGH (ref 6–20)
CALCIUM: 7 mg/dL — AB (ref 8.9–10.3)
CO2: 19 mmol/L — AB (ref 22–32)
CREATININE: 1.65 mg/dL — AB (ref 0.44–1.00)
Chloride: 106 mmol/L (ref 101–111)
GFR calc Af Amer: 35 mL/min — ABNORMAL LOW (ref >60)
GFR calc non Af Amer: 30 mL/min — ABNORMAL LOW (ref >60)
GLUCOSE: 125 mg/dL — AB (ref 65–99)
Potassium: 4.6 mmol/L (ref 3.5–5.1)
Sodium: 135 mmol/L (ref 135–145)

## 2015-03-07 LAB — GLUCOSE, CAPILLARY
GLUCOSE-CAPILLARY: 133 mg/dL — AB (ref 65–99)
Glucose-Capillary: 119 mg/dL — ABNORMAL HIGH (ref 65–99)
Glucose-Capillary: 107 mg/dL — ABNORMAL HIGH (ref 65–99)
Glucose-Capillary: 133 mg/dL — ABNORMAL HIGH (ref 65–99)

## 2015-03-07 LAB — MAGNESIUM: Magnesium: 1.8 mg/dL (ref 1.7–2.4)

## 2015-03-07 LAB — TROPONIN I: Troponin I: 0.29 ng/mL — ABNORMAL HIGH (ref <0.031)

## 2015-03-07 MED ORDER — IPRATROPIUM-ALBUTEROL 0.5-2.5 (3) MG/3ML IN SOLN
3.0000 mL | Freq: Four times a day (QID) | RESPIRATORY_TRACT | Status: DC
  Administered 2015-03-08 – 2015-03-10 (×8): 3 mL via RESPIRATORY_TRACT
  Filled 2015-03-07 (×8): qty 3

## 2015-03-07 MED ORDER — IPRATROPIUM-ALBUTEROL 0.5-2.5 (3) MG/3ML IN SOLN
3.0000 mL | Freq: Four times a day (QID) | RESPIRATORY_TRACT | Status: DC
  Administered 2015-03-07: 3 mL via RESPIRATORY_TRACT

## 2015-03-07 MED ORDER — IPRATROPIUM-ALBUTEROL 0.5-2.5 (3) MG/3ML IN SOLN
3.0000 mL | Freq: Four times a day (QID) | RESPIRATORY_TRACT | Status: DC | PRN
Start: 2015-03-07 — End: 2015-03-10

## 2015-03-07 NOTE — Progress Notes (Signed)
Occupational Therapy Evaluation Patient Details Name: Brittney Cunningham MRN: [id number] DOB: 05/07/1940 Today's Date: 03/07/2015    History of Present Illness 75 yo female admitted with onset of nasal congestion, rhinorrhea, cough, and worsening fatigue. PMH of stage IV right breast cancer diagnosed May/2011 with metastasis to the bone and liver status post neoadjuvant therapy, right modified radical mastectomy 03/2010 and radiation treatment    Clinical Impression   Limited evaluation this date due to patient not willing to get EOB/OOB due to "not feeling well." She reports getting up to [facility] with assistance of nursing staff. Patient reports her husband cannot physically assist her and her children work, so they are only available sometimes to assist. She reports weakness and decreased activity tolerance from hospitalization. OT will follow to make d/c recommendations and to increase patient's ADL independence and safety as able.    Follow Up Recommendations  Other (comment) -- to be determined. May need SNF rehab and patient is in agreement if that is the recommendation. Her husband cannot physically assist her at home.   Equipment Recommendations  Other (comment) (to be determined)    Recommendations for Other Services PT consult     Precautions / Restrictions Precautions Precautions: Fall Precaution Comments: recent dizziness at home Restrictions Weight Bearing Restrictions: No      Mobility Bed Mobility               General bed mobility comments: patient refused EOB/OOB activities during evaluation  Transfers                      Balance                                            ADL Overall ADL's : Needs assistance/impaired Eating/Feeding: Set up;Bed level   Grooming: Wash/dry hands;Wash/dry face;Set up;Bed level   Upper Body Bathing: Maximal assistance;Bed level   Lower Body Bathing: Maximal assistance;Bed level              Toilet Transfer Details (indicate cue type and reason): reports she has been getting up to [facility] with nursing staff and assistance           General ADL Comments: Patient received in bed; only willing for bed level limited evaluation this date due to "not feeling well." Reports she has been getting up to [facility] with assistance of nursing staff but has not been ambulating to bathroom. Reports weakness from being in bed. Patient reports her husband cannot physicall assist her at home and she has children available to help, but they work. Briefly discussed possible need for SNF rehab. She reports she has been to a SNF for rehab in the past and is willing to go if that is the recommendation. Will solidify OT recommendations next session.     Vision     Perception     Praxis      Pertinent Vitals/Pain Pain Assessment: 0-10 Pain Score: 0-No pain     Hand Dominance Right   Extremity/Trunk Assessment Upper Extremity Assessment Upper Extremity Assessment: Generalized weakness;RUE deficits/detail;LUE deficits/detail RUE Deficits / Details: AROM shoulder limited to 80 degrees LUE Deficits / Details: AROM of shoulder limited to 80 degrees   Lower Extremity Assessment Lower Extremity Assessment: Defer to PT evaluation       Communication Communication Communication: No difficulties   Cognition Arousal/Alertness: Awake/alert  Behavior During Therapy: WFL for tasks assessed/performed Overall Cognitive Status: Within Functional Limits for tasks assessed                     General Comments       Exercises       Shoulder Instructions      Home Living Family/patient expects to be discharged to:: Private residence Living Arrangements: Spouse/significant other (husband is in poor health) Available Help at Discharge: Family;Available PRN/intermittently (daughter and son available PRN (they work)) Type of Home: House Home Access: [place]: Able to live  on main level with bedroom/bathroom;Laundry or work area in basement     [employer] Shower/Tub: Teacher, Anida Deol years/pre: Handicapped height Bathroom Accessibility: Yes How Accessible: Accessible via [place]: Environmental consultant - 2 wheels;Cane - single point;Shower seat;Hand held shower head;Bedside commode          Prior Functioning/Environment Level of Independence: Independent with assistive device(s)        Comments: used RW for ambulation just prior to admission    OT Diagnosis: Generalized weakness   OT Problem List: Decreased strength;Decreased activity tolerance;Decreased knowledge of use of DME or AE;Pain   OT Treatment/Interventions: Self-care/ADL training;Energy conservation;DME and/or AE instruction;Therapeutic activities;Patient/family education    OT Goals(Current goals can be found in the care plan section) Acute Rehab OT Goals Patient Stated Goal: none stated OT Goal Formulation: With patient Time For Goal Achievement: 03/21/15 Potential to Achieve Goals: Fair ADL Goals Pt Will Perform Upper Body Bathing: with supervision;sitting Pt Will Perform Lower Body Bathing: with supervision;sit to/from stand Pt Will Perform Upper Body Dressing: with supervision;sitting Pt Will Perform Lower Body Dressing: with supervision;sit to/from stand Pt Will Transfer to Toilet: with supervision;ambulating;bedside commode Pt Will Perform Toileting - Clothing Manipulation and hygiene: with supervision;sit to/from stand  OT Frequency: Min 2X/week   Barriers to D/C: Decreased caregiver support  husband unable to physically assist; dtr works       Co-evaluation              End of Session    Activity Tolerance: Patient limited by fatigue Patient left: in bed;with call bell/phone within reach   Time: 1048-1100 OT Time Calculation (min): 12 min Charges:  OT General Charges $OT Visit: 1 Procedure OT Evaluation $OT Eval Moderate Complexity: 1  Procedure G-Codes:    Froylan Hobby A Mar 25, 2015, 11:37 AM

## 2015-03-07 NOTE — Progress Notes (Addendum)
Progress Note   Brittney Cunningham [id number] DOB: [birth year] DOA: 03/02/2015 PCP: [name], MD   Brief Narrative:   75 y.o. female with a PMH of stage IV right breast cancer diagnosed May/2011 with metastasis to the bone and liver status post neoadjuvant therapy, right modified radical mastectomy 03/2010 and radiation treatment, under the care of Dr. [name] who is currently treating her for progressive disease with eribulin with last dose given 02/24/15 followed by Neulasta support, who was admitted 03/02/15 with dizziness, fatigue and upper respiratory symptoms. Upon initial presentation, she was found to be tachycardic, tachypneic with a fever of 100.8. She was admitted for possible sepsis.  Assessment/Plan:   Principal Problem:  Sepsis ([place]) - Source thought to be from HCAP. ESBL UTI - Status post fluid volume resuscitation. The pressure stable. - changed abx to primaxin to target ESBL in urine 2/3, given one dose of fosfomycin 2/4 to target ESBL - CT chest confirm LUL PNA, placed on Zithromax and Rocephin 2/4, continue same regimen for now - continue antitussives, BD's as needed   Active Problems:   Irregular heart sounds - tachycardia resolved, pt asking to d/c tele - no chest pain this AM but worried about hyperK, would keep on tele for now - pt advised to let us know if she has any chest pain or shortness of breath  - keep electrolytes stable, K ~4, Mg ~2     Elevated troponin secondary to demand ischemia - in the setting of sepsis - no chest pain this AM - no need for further testing of troponins    Thrombocytopenia - stopped all heparin products 2/2 - SCD's for DVT prophylaxis  - Plt improving  - CBC in AM   Hypothyroidism - Continue Synthroid.   Essential hypertension - Continue Coreg as blood pressure can tolerate.   Breast cancer metastasized to bone [facility]) and liver - Under the care of Dr. [name]. Status post chemotherapy 02/24/15 followed  by Neulasta support.   Mixed collagen vascular disease (HCC)    Chronic kidney disease, stage II - III - with last baseline Cr 1.4 - 1.7 in the past several months - Cr slightly up in the past 48 hours but still at baseline  - monitor with BMP    Hyperkalemia  - give dose of Kayexalate - BMP in AM    Hypomagnesemia and hypocalcemia  - supplemented, Mg and K WNL - repeat Mg and BMP in AM   CAD (coronary artery disease), native coronary artery - Continue Lipitor and aspirin/Plavix.   Diabetes mellitus with complication (HCC) / hypoglycemia - Use insulin sensitive SSI for now until oral intake improves    Anemia of chronic renal failure, stage 4 (severe) (HCC) - Has required unit of blood transfusion while inpatient - no signs of active bleeding, Hg up from 7.8 --> 8.5 --> 7.8 this AM - CBC in AM   Pain from bone metastases (HCC) - Continue MS Contin and MSIR for breakthrough pain.   DVT prophylaxis - SCD's  Family Communication/Anticipated D/C date and plan/Code Status   Family Communication: no family present this a.m. Disposition Plan: Home when sepsis resolved. Anticipated D/C date:   03/09/2015. Code Status: Full code.  IV Access:    Peripheral IV  Procedures and diagnostic studies:    Dg Chest 2 View 03/02/2015 Chronic RIGHT basilar atelectasis. No acute pulmonary abnormalities. Diffuse osseous metastases.  Ct Chest Wo Contrast  03/06/2015  Posterior RUL PNA, bilateral pleural effusions  with bilateral lower lobe atelectasis/consolidation, multiple lung nodules, 5 mm LLL nodule unchanged.  Medical Consultants:    None.  Anti-Infectives:    Vancomycin 03/02/15---> 2/3  Zosyn 03/02/15---> 2/3  Primaxin 2/3 --> change to fosfomycin x 1 dose  Zithromax and Rocephin 2/4 -->   Subjective:   No complaints of pain, N/V/D.  Objective:  Reports feeling better.  Filed Vitals:   03/06/15 2109 03/07/15 0452 03/07/15 0550 03/07/15 1322  BP: 104/61  108/63  80/59  Pulse: 78 90  63  Temp: 98 F (36.7 C) 98.8 F (37.1 C)  97.5 F (36.4 C)  TempSrc: Axillary Axillary  Oral  Resp: 18 18  18   Height:      Weight:   80.2 kg (176 lb 12.9 oz)   SpO2: 92% 94%  99%    Intake/Output Summary (Last 24 hours) at 03/07/15 1406 Last data filed at 03/07/15 0453  Gross per 24 hour  Intake    330 ml  Output      0 ml  Net    330 ml   Filed Weights   03/02/15 1103 03/06/15 0419 03/07/15 0550  Weight: 72.122 kg (159 lb) 79.6 kg (175 lb 7.8 oz) 80.2 kg (176 lb 12.9 oz)    Exam: Gen:  NAD Cardiovascular:  Sinus rhythm, S1/S2 Respiratory:  Lungs with diminished breath sounds at bases with rhonchi in upper lobes bilaterally  Gastrointestinal:  Abdomen soft, NT/ND, + BS Extremities:  Trace edema in LE   Data Reviewed:    Labs: Basic Metabolic Panel:  Recent Labs Lab 03/05/15 0515 03/05/15 1830 03/06/15 0500 03/06/15 1250 03/06/15 1959 03/07/15 0405  NA 136 136 135  --  137 135  K 5.5* 5.4* 5.9*  --  5.1 4.6  CL 108 107 106  --  107 106  CO2 19* 18* 18*  --  20* 19*  GLUCOSE 119* 153* 161*  --  127* 125*  BUN 35* 35* 35*  --  38* 37*  CREATININE 1.60* 1.62* 1.70*  --  1.68* 1.65*  CALCIUM 6.4* 6.8* 7.0*  --  7.3* 7.0*  MG 1.4*  --   --  1.8 1.8 1.8   Liver Function Tests:  Recent Labs Lab 03/02/15 1120  AST 28  ALT 11*  ALKPHOS 94  BILITOT 1.4*  PROT 6.3*  ALBUMIN 2.9*   Coagulation profile  Recent Labs Lab 03/02/15 1654  INR 1.34    CBC:  Recent Labs Lab 03/02/15 1120  03/03/15 0405 03/04/15 0439 03/05/15 0515 03/06/15 0500 03/07/15 0405  WBC 6.0  < > 5.0 5.5 7.2 8.7 7.0  NEUTROABS 3.9  --   --   --   --   --   --   HGB 8.9*  < > 6.9* 8.3* 7.8* 8.5* 7.8*  HCT 29.0*  < > 22.0* 26.6* 25.5* 27.6* 25.3*  MCV 92.9  < > 94.4 94.0 95.1 94.2 92.7  PLT 192  < > 158 140* 118* 114* 97*  < > = values in this interval not displayed. Sepsis Labs:  Recent Labs Lab 03/02/15 1459 03/02/15 1654  03/02/15 2154  03/04/15 0439 03/05/15 0515 03/06/15 0500 03/06/15 1955 03/07/15 0405  PROCALCITON  --  2.93  --   --   --   --   --   --   --   WBC  --  3.9*  --   < > 5.5 7.2 8.7  --  7.0  LATICACIDVEN 2.26*  --  2.5*  --   --  2.2*  --  2.3*  --   < > = values in this interval not displayed. Microbiology Recent Results (from the past 240 hour(s))  Culture, blood (routine x 2)     Status: None (Preliminary result)   Collection Time: 03/02/15 11:20 AM  Result Value Ref Range Status   Specimen Description BLOOD LEFT ANTECUBITAL  Final   Special Requests BOTTLES DRAWN AEROBIC AND ANAEROBIC 5CC EACH  Final   Culture   Final    NO GROWTH 4 DAYS Performed at [facility]    Report Status PENDING  Incomplete  Culture, blood (routine x 2)     Status: None (Preliminary result)   Collection Time: 03/02/15 11:50 AM  Result Value Ref Range Status   Specimen Description BLOOD RIGHT PORTA CATH  Final   Special Requests BOTTLES DRAWN AEROBIC AND ANAEROBIC 10CC EACH  Final   Culture   Final    NO GROWTH 4 DAYS Performed at [facility]    Report Status PENDING  Incomplete  MRSA PCR Screening     Status: None   Collection Time: 03/02/15  4:12 PM  Result Value Ref Range Status   MRSA by PCR NEGATIVE NEGATIVE Final    Comment:        The GeneXpert MRSA Assay (FDA approved for NASAL specimens only), is one component of a comprehensive MRSA colonization surveillance program. It is not intended to diagnose MRSA infection nor to guide or monitor treatment for MRSA infections.   Respiratory virus panel     Status: None   Collection Time: 03/02/15  6:46 PM  Result Value Ref Range Status   Respiratory Syncytial Virus A Negative Negative Final   Respiratory Syncytial Virus B Negative Negative Final   Influenza A Negative Negative Final   Influenza B Negative Negative Final   Parainfluenza 1 Negative Negative Final   Parainfluenza 2 Negative Negative Final   Parainfluenza  3 Negative Negative Final   Metapneumovirus Negative Negative Final   Rhinovirus Negative Negative Final   Adenovirus Negative Negative Final    Comment: (NOTE) Performed At: [facility] [place], [place] [id number] Lindon Romp MD [id number]   Urine culture     Status: None   Collection Time: 03/02/15  9:57 PM  Result Value Ref Range Status   Specimen Description URINE, CLEAN CATCH  Final   Special Requests NONE  Final   Culture   Final    40,000 COLONIES/ml ESCHERICHIA COLI Confirmed Extended Spectrum Beta-Lactamase Producer (ESBL) Performed at [facility]    Report Status 03/05/2015 FINAL  Final   Organism ID, Bacteria ESCHERICHIA COLI  Final      Susceptibility   Escherichia coli - MIC*    AMPICILLIN >=32 RESISTANT Resistant     CEFAZOLIN >=64 RESISTANT Resistant     CEFTRIAXONE >=64 RESISTANT Resistant     CIPROFLOXACIN >=4 RESISTANT Resistant     GENTAMICIN <=1 SENSITIVE Sensitive     IMIPENEM <=0.25 SENSITIVE Sensitive     NITROFURANTOIN <=16 SENSITIVE Sensitive     TRIMETH/SULFA >=320 RESISTANT Resistant     AMPICILLIN/SULBACTAM >=32 RESISTANT Resistant     PIP/TAZO 64 INTERMEDIATE Intermediate     * 40,000 COLONIES/ml ESCHERICHIA COLI     Medications:   . aspirin  81 mg Oral Daily  . atorvastatin  80 mg Oral Daily  . azithromycin  500 mg Intravenous Q24H  . calcium carbonate  1  tablet Oral TID WC  . carvedilol  12.5 mg Oral BID WC  . cefTRIAXone (ROCEPHIN)  IV  1 g Intravenous Q24H  . clopidogrel  75 mg Oral Daily  . cyanocobalamin  1,000 mcg Intramuscular Q30 days  . gabapentin  100 mg Oral TID  . guaiFENesin  600 mg Oral BID  . insulin aspart  0-5 Units Subcutaneous QHS  . insulin aspart  0-9 Units Subcutaneous TID WC  . levothyroxine  150 mcg Oral QAC breakfast  . mirtazapine  15 mg Oral QHS  . morphine  30 mg Oral TID  . pantoprazole  40 mg Oral Daily  . polyethylene glycol  17 g Oral Daily  . senna-docusate  1  tablet Oral QHS  . sodium chloride flush  10-40 mL Intracatheter Q12H   Continuous Infusions:    Time spent: 35 minutes.  The patient is medically complex with multiple co-morbidities and is at high risk for clinical deterioration and requires high complexity decision making.   LOS: 5 days   Faye Ramsay  [facility] Pager [phone]. If unable to reach me by pager, please call my cell phone at [phone]  Please refer to [email or web address], password [email or web address] to get updated schedule on who will round on this patient, as hospitalists switch teams weekly. If 7PM-7AM, please contact night-coverage at [email or web address], password [email or web address] for any overnight needs.  03/07/2015, 2:06 PM

## 2015-03-07 NOTE — Progress Notes (Signed)
Patient have been lethargic but easily arousable and follows commend, forgetful at times. Scheduled morphine held this AM, had runs of SVT that lasted 6sec during activity with physical therapy patient C/O slight dizziness but denies any pain, vitals 119/61, HR-71. Dr. Doyle Askew informed. Also notified her of earlier blood pressure in the 80s. Orders given, will continue to assess patient.

## 2015-03-07 NOTE — Evaluation (Addendum)
Physical Therapy Evaluation Patient Details Name: Brittney Cunningham MRN: [id number] DOB: 12/08/1940 Today's Date: 03/07/2015   History of Present Illness  75 yo female admitted with onset of nasal congestion, rhinorrhea, cough, and worsening fatigue. Dx of HCAP, UTI, sepsis.  PMH of stage IV right breast cancer diagnosed May/2011 with metastasis to the bone and liver status post neoadjuvant therapy, right modified radical mastectomy 03/2010 and radiation treatment   Clinical Impression  Pt admitted with above diagnosis. Pt currently with functional limitations due to the deficits listed below (see PT Problem List). Min to mod assist for bed mobility and transfers with RW. Pt is weak and deconditioned, will likely require ST-SNF.  Pt will benefit from skilled PT to increase their independence and safety with mobility to allow discharge to the venue listed below.       Follow Up Recommendations SNF    Equipment Recommendations  None recommended by PT    Recommendations for Other Services OT consult     Precautions / Restrictions        Mobility  Bed Mobility Overal bed mobility: Needs Assistance Bed Mobility: Supine to Sit     Supine to sit: Mod assist     General bed mobility comments: mod A to raise trunk and pivot hips to EOB  Transfers Overall transfer level: Needs assistance Equipment used: Rolling walker (2 wheeled) Transfers: Sit to/from [facility] Sit to Stand: Mod assist         General transfer comment: MOD assist to rise, min/guard for SPT to recliner using RW. Pt reported dizziness in sitting. BP supine 119/61, sitting 125/41. Not able to stand long enough to get standing BP.  Ambulation/Gait                Stairs            Wheelchair Mobility    Modified Rankin (Stroke Patients Only)       Balance Overall balance assessment: Needs assistance   Sitting balance-Leahy Scale: Good     Standing balance support: Bilateral  upper extremity supported Standing balance-Leahy Scale: Poor                               Pertinent Vitals/Pain Pain Assessment: No/denies pain    Home Living Family/patient expects to be discharged to:: Private residence Living Arrangements: Spouse/significant other Available Help at Discharge: Family;Available PRN/intermittently (son and daughter available after work, husband not able to assist) Type of Home: House Home Access: Ramped entrance     [place]: Able to live on main level with bedroom/bathroom;Laundry or work area in [place]: Environmental consultant - 2 wheels;Cane - single point;Shower seat;Hand held shower head;Bedside commode      Prior Function Level of Independence: Independent with assistive device(s)         Comments: used RW for ambulation just prior to admission     Hand Dominance        Extremity/Trunk Assessment     RUE Deficits / Details: AROM shoulder limited to 80 degrees     LUE Deficits / Details: AROM of shoulder limited to 80 degrees   Lower Extremity Assessment: Generalized weakness (knee extension -4/5 B)      Cervical / Trunk Assessment: Kyphotic  Communication   Communication: No difficulties  Cognition Arousal/Alertness: Awake/alert Behavior During Therapy: WFL for tasks assessed/performed Overall Cognitive Status: Within Functional Limits for tasks assessed  General Comments      Exercises  Ankle Pumps x 10 AROM B Quad Sets x 5 AROM B      Assessment/Plan    PT Assessment Patient needs continued PT services  PT Diagnosis Difficulty walking;Generalized weakness   PT Problem List Decreased strength;Decreased activity tolerance;Decreased balance;Decreased mobility  PT Treatment Interventions Gait training;Functional mobility training;Therapeutic activities;Patient/family education;Balance training;Therapeutic exercise   PT Goals (Current goals can be found in the Care  Plan section) Acute Rehab PT Goals Patient Stated Goal: to go home PT Goal Formulation: With patient/family Time For Goal Achievement: 03/21/15 Potential to Achieve Goals: Good    Frequency Min 3X/week   Barriers to discharge Decreased caregiver support      Co-evaluation               End of Session Equipment Utilized During Treatment: Gait belt Activity Tolerance: Patient limited by fatigue Patient left: in chair;with family/visitor present;with call bell/phone within reach           Time: 1402-1430 PT Time Calculation (min) (ACUTE ONLY): 28 min   Charges:   PT Evaluation $PT Eval Moderate Complexity: 1 Procedure     PT G CodesPhilomena Doheny 03/07/2015, 2:44 PM [phone]

## 2015-03-08 ENCOUNTER — Ambulatory Visit: Payer: Self-pay

## 2015-03-08 ENCOUNTER — Other Ambulatory Visit: Payer: Self-pay

## 2015-03-08 LAB — GLUCOSE, CAPILLARY
GLUCOSE-CAPILLARY: 95 mg/dL (ref 65–99)
GLUCOSE-CAPILLARY: 132 mg/dL — AB (ref 65–99)
GLUCOSE-CAPILLARY: 161 mg/dL — AB (ref 65–99)
Glucose-Capillary: 117 mg/dL — ABNORMAL HIGH (ref 65–99)

## 2015-03-08 LAB — CBC
HEMATOCRIT: 25.1 % — AB (ref 36.0–46.0)
Hemoglobin: 7.6 g/dL — ABNORMAL LOW (ref 12.0–15.0)
MCH: 28.8 pg (ref 26.0–34.0)
MCHC: 30.3 g/dL (ref 30.0–36.0)
MCV: 95.1 fL (ref 78.0–100.0)
PLATELETS: 94 10*3/uL — AB (ref 150–400)
RBC: 2.64 MIL/uL — AB (ref 3.87–5.11)
RDW: 16.9 % — AB (ref 11.5–15.5)
WBC: 6.7 10*3/uL (ref 4.0–10.5)

## 2015-03-08 LAB — BASIC METABOLIC PANEL
ANION GAP: 9 (ref 5–15)
BUN: 37 mg/dL — ABNORMAL HIGH (ref 6–20)
CALCIUM: 7.3 mg/dL — AB (ref 8.9–10.3)
CO2: 22 mmol/L (ref 22–32)
Chloride: 107 mmol/L (ref 101–111)
Creatinine, Ser: 1.43 mg/dL — ABNORMAL HIGH (ref 0.44–1.00)
GFR, EST AFRICAN AMERICAN: 41 mL/min — AB (ref >60)
GFR, EST NON AFRICAN AMERICAN: 36 mL/min — AB (ref >60)
Glucose, Bld: 95 mg/dL (ref 65–99)
POTASSIUM: 4.4 mmol/L (ref 3.5–5.1)
Sodium: 138 mmol/L (ref 135–145)

## 2015-03-08 MED ORDER — GABAPENTIN 100 MG PO CAPS
100.0000 mg | ORAL_CAPSULE | Freq: Two times a day (BID) | ORAL | Status: DC
  Administered 2015-03-08 – 2015-03-09 (×3): 100 mg via ORAL
  Filled 2015-03-08 (×4): qty 1

## 2015-03-08 NOTE — Progress Notes (Signed)
Nutrition Follow-up  DOCUMENTATION CODES:   Not applicable  INTERVENTION:  - Continue Magic Cup TID - RD will continue to monitor for needs  NUTRITION DIAGNOSIS:   Inadequate oral intake related to poor appetite as evidenced by per patient/family report. -improving  GOAL:   Patient will meet greater than or equal to 90% of their needs -likely minimally met  MONITOR:   PO intake, Supplement acceptance, Weight trends, Labs, Skin, I & O's  ASSESSMENT:   Brittney Cunningham is an 75 y.o. female with a PMH of stage IV right breast cancer diagnosed May/2011 with metastasis to the bone and liver status post neoadjuvant therapy, right modified radical mastectomy 03/2010  2/6 Per chart review, pt ate 50% of dinner 2/4, 40% of breakfast and 100% of lunch yesterday (2/5), and 100% of breakfast and 50% of lunch today. Pt sleeping at time of RD visit and her sister was at bedside. Sister states that she arrived after lunch today and that she was unable to visualize tray. Sister is unsure about intakes prior to today. Spoke with RN who states that pt ate a few bites of meat at lunch and ~70% of each of the sides on her tray.   Nutrition needs adjusted since last assessment given stage 4 cancer as well as stage 2 wound. Likely meeting minimal kcal needs, not meeting protein needs. Will continue to monitor intakes of meals and talk more extensively about supplements with pt at follow-up. Medications reviewed. Labs reviewed; CBGs: 95-152 mg/dL, BUN/creatinine elevated, Ca: 7.3 mg/dL, GFR: 36.   2/1 - She was last treated with eribulin on 02/24/15, followed by neulasta support. - Pt stated her usual body weight is 150-160#.  - No significant wt loss at this time.  - Pt stated that her husband has multiple myleoma and she is taking care of him at home as well.  - She reports relatively normal PO intake at home.  - Cereal in the AM, a sandwich for lunch, and she "cooks a meal" for dinner. - But she  had not eaten much in the past 3-4 days because she did not feel well. - Nutrition-Focused physical exam completed. Findings are no fat depletion, mild muscle depletion, and no edema.   Diet Order:  Diet Carb Modified Fluid consistency:: Thin; Room service appropriate?: Yes  Skin:  Wound (see comment) (Stage 2 sacral pressure ulcer)  Last BM:  2/5  Height:   Ht Readings from Last 1 Encounters:  03/02/15 '5\' 3"'$  (1.6 m)    Weight:   Wt Readings from Last 1 Encounters:  03/08/15 183 lb 3.2 oz (83.1 kg)    Ideal Body Weight:  52.27 kg  BMI:  Body mass index is 32.46 kg/(m^2).  Estimated Nutritional Needs:   Kcal:  1700-1900  Protein:  80-90 grams  Fluid:  >/= 1.7 L/day  EDUCATION NEEDS:   No education needs identified at this time     Jarome Matin, RD, LDN Inpatient Clinical Dietitian Pager # [phone] After hours/weekend pager # [phone]

## 2015-03-08 NOTE — Progress Notes (Signed)
Physical Therapy Treatment Patient Details Name: Brittney Cunningham MRN: [id number] DOB: 08/16/1940 Today's Date: 03/08/2015    History of Present Illness 75 yo female admitted with onset of nasal congestion, rhinorrhea, cough, and worsening fatigue. Dx of HCAP, UTI, sepsis.  PMH of stage IV right breast cancer diagnosed May/2011 with metastasis to the bone and liver status post neoadjuvant therapy, right modified radical mastectomy 03/2010 and radiation treatment     PT Comments    Pt agreed reluctantly to PT, requiring less assist with transfers, not agreeable to amb d/t fatigue; 2/4 DOE during bed mobility and transfers  Follow Up Recommendations  SNF     Equipment Recommendations  None recommended by PT    Recommendations for Other Services       Precautions / Restrictions Precautions Precautions: Fall Precaution Comments: recent dizziness at home Restrictions Weight Bearing Restrictions: No    Mobility  Bed Mobility Overal bed mobility: Needs Assistance Bed Mobility: Supine to Sit     Supine to sit: Mod assist     General bed mobility comments: cues to self assist, assist to bring LEs off bed and bring trunk to full upright position, able to scoot in sitting with min/guard and cues for participation  Transfers Overall transfer level: Needs assistance Equipment used: Rolling walker (2 wheeled) Transfers: Sit to/from [facility] Sit to Stand: Min assist Stand pivot transfers: Min assist       General transfer comment: assist to rise, verbal cues for hand placement, no dizziness  Ambulation/Gait             General Gait Details: pt declined to amb d/t fatigue   Stairs            Wheelchair Mobility    Modified Rankin (Stroke Patients Only)       Balance Overall balance assessment: Needs assistance         Standing balance support: Bilateral upper extremity supported;During functional activity Standing balance-Leahy Scale:  Poor                      Cognition Arousal/Alertness: Awake/alert (falls asleep easily) Behavior During Therapy: WFL for tasks assessed/performed Overall Cognitive Status: Within Functional Limits for tasks assessed                      Exercises General Exercises - Lower Extremity Ankle Circles/Pumps: AROM;Both;10 reps;Supine Quad Sets: AROM;Strengthening;Both;10 reps    General Comments        Pertinent Vitals/Pain Pain Assessment: No/denies pain    Home Living                      Prior Function            PT Goals (current goals can now be found in the care plan section) Acute Rehab PT Goals Patient Stated Goal: to go home PT Goal Formulation: With patient/family Time For Goal Achievement: 03/21/15 Potential to Achieve Goals: Good Progress towards PT goals: Progressing toward goals    Frequency  Min 3X/week    PT Plan Current plan remains appropriate    Co-evaluation             End of Session Equipment Utilized During Treatment: Gait belt Activity Tolerance: Patient limited by fatigue Patient left: in chair;with call bell/phone within reach;with family/visitor present;with chair alarm set     Time: [id number] PT Time Calculation (min) (ACUTE ONLY): 15 min  Charges:  $Therapeutic Activity:  8-22 mins                    G Codes:      Elberta Lachapelle 03-20-2015, 3:19 PM

## 2015-03-08 NOTE — Clinical Social Work Placement (Signed)
   CLINICAL SOCIAL WORK PLACEMENT  NOTE  Date:  03/08/2015  Patient Details  Name: Brittney Cunningham MRN: [id number] Date of Birth: 04-17-1940  Clinical Social Work is seeking post-discharge placement for this patient at the [place] level of care (*CSW will initial, date and re-position this form in  chart as items are completed):  Yes   Patient/family provided with [facility] Work Department's list of facilities offering this level of care within the geographic area requested by the patient (or if unable, by the patient's family).  Yes   Patient/family informed of their freedom to choose among providers that offer the needed level of care, that participate in Medicare, Medicaid or managed care program needed by the patient, have an available bed and are willing to accept the patient.  Yes   Patient/family informed of [facility]'s ownership interest in [facility] and [facility], as well as of the fact that they are under no obligation to receive care at these facilities.  PASRR submitted to EDS on       PASRR number received on       Existing PASRR number confirmed on 03/08/15     FL2 transmitted to all facilities in geographic area requested by pt/family on 03/08/15     FL2 transmitted to all facilities within larger geographic area on 03/08/15     Patient informed that his/her managed care company has contracts with or will negotiate with certain facilities, including the following:            Patient/family informed of bed offers received.  Patient chooses bed at       Physician recommends and patient chooses bed at      Patient to be transferred to   on  .  Patient to be transferred to facility by       Patient family notified on   of transfer.  Name of family member notified:        PHYSICIAN       Additional Comment:    _______________________________________________ Harlon Flor, Student-SW 03/08/2015, 10:24 AM

## 2015-03-08 NOTE — NC FL2 (Signed)
[place] LEVEL OF CARE SCREENING TOOL     IDENTIFICATION  Patient Name: Brittney Cunningham Birthdate: 1940-12-27 Sex: female Admission Date (Current Location): 03/02/2015  [facility] and [place] Number:  Herbalist and Address:  [facility],  [place] [address] [place]      Provider Number: [id number]  Attending Physician Name and Address:  Theodis Blaze, MD  Relative Name and Phone Number:       Current Level of Care: SNF Recommended Level of Care: [place] Prior Approval Number:    Date Approved/Denied:   PASRR Number: [id number]  Discharge Plan: SNF    Current Diagnoses: Patient Active Problem List   Diagnosis Date Noted  . Pressure ulcer 03/05/2015  . Hypokalemia 03/03/2015  . Sepsis ([place]) 03/02/2015  . Pain from bone metastases ([place]) 02/12/2015  . Anemia of chronic renal failure, stage 4 (severe) ([place]) 01/14/2015  . Diabetes mellitus with complication ([place]) [id number]  . Cancer of overlapping sites of right female breast ([place]) 12/15/2013  . Mixed collagen vascular disease ([place]) 03/11/2013  . Peripheral edema 10/29/2012  . Breast cancer metastasized to bone ([place]) 06/16/2011  . Hypothyroidism 06/10/2009  . Essential hypertension 06/10/2009    Orientation RESPIRATION BLADDER Height & Weight     Self, Time, Situation, Place  O2 (2L/min) Continent Weight: 183 lb 3.2 oz (83.1 kg) Height:  5\' 3"  (160 cm)  BEHAVIORAL SYMPTOMS/MOOD NEUROLOGICAL BOWEL NUTRITION STATUS      Continent Diet (Diet Carb Modified; Fluid consistency: Thin; Room service appropriate)  AMBULATORY STATUS COMMUNICATION OF NEEDS Skin   Extensive Assist Verbally PU Stage and Appropriate Care (PressureUlcer02/03/17StageII-Partialthicknesslossofdermispresentingasashallowopenulcerwithared,pinkwoundbedwithoutslough.openskin,nodrainageorsignofinfection)                       Personal Care Assistance Level  of Assistance  Bathing, Feeding, Dressing Bathing Assistance: Maximum assistance Feeding assistance: Independent Dressing Assistance: Maximum assistance     Functional Limitations Info  Sight, Hearing, Speech Sight Info: Adequate Hearing Info: Adequate Speech Info: Adequate    SPECIAL CARE FACTORS FREQUENCY  PT (By licensed PT), OT (By licensed OT)     PT Frequency: 5x week OT Frequency: 5x week            Contractures Contractures Info: Not present    Additional Factors Info  Code Status, Allergies, Isolation Precautions Code Status Info: FULL Allergies Info: Chlorhexidine, Adhesive, Codeine      Isolation Precautions Info: Extended spectrum beta lactamase      Current Medications (03/08/2015):  This is the current hospital active medication list Current Facility-Administered Medications  Medication Dose Route Frequency Provider Last Rate Last Dose  . acetaminophen (TYLENOL) tablet 650 mg  650 mg Oral Q6H PRN Venetia Maxon Rama, MD   650 mg at 03/05/15 1229   Or  . acetaminophen (TYLENOL) suppository 650 mg  650 mg Rectal Q6H PRN Venetia Maxon Rama, MD      . aspirin chewable tablet 81 mg  81 mg Oral Daily Venetia Maxon Rama, MD   81 mg at 03/08/15 0905  . atorvastatin (LIPITOR) tablet 80 mg  80 mg Oral Daily Venetia Maxon Rama, MD   80 mg at 03/08/15 0905  . azithromycin (ZITHROMAX) 500 mg in dextrose 5 % 250 mL IVPB  500 mg Intravenous Q24H Theodis Blaze, MD   500 mg at 03/07/15 2156  . calcium carbonate (TUMS - dosed in mg elemental calcium) chewable tablet 200 mg of elemental calcium  1  tablet Oral TID WC Venetia Maxon Rama, MD   200 mg of elemental calcium at 03/08/15 0902  . cefTRIAXone (ROCEPHIN) 1 g in dextrose 5 % 50 mL IVPB  1 g Intravenous Q24H Theodis Blaze, MD   1 g at 03/07/15 1947  . clopidogrel (PLAVIX) tablet 75 mg  75 mg Oral Daily Venetia Maxon Rama, MD   75 mg at 03/08/15 0905  . cyanocobalamin ((VITAMIN B-12)) injection 1,000 mcg  1,000 mcg Intramuscular Q30 days  Venetia Maxon Rama, MD   1,000 mcg at 03/03/15 1628  . gabapentin (NEURONTIN) capsule 100 mg  100 mg Oral TID Venetia Maxon Rama, MD   100 mg at 03/08/15 0905  . glycerin adult suppository 1 suppository  1 suppository Rectal Once PRN Venetia Maxon Rama, MD      . guaiFENesin (MUCINEX) 12 hr tablet 600 mg  600 mg Oral BID Venetia Maxon Rama, MD   600 mg at 03/08/15 0905  . guaiFENesin-dextromethorphan (ROBITUSSIN DM) 100-10 MG/5ML syrup 5 mL  5 mL Oral Q4H PRN Theodis Blaze, MD      . insulin aspart (novoLOG) injection 0-5 Units  0-5 Units Subcutaneous QHS Venetia Maxon Rama, MD   0 Units at 03/03/15 2143  . insulin aspart (novoLOG) injection 0-9 Units  0-9 Units Subcutaneous TID WC Venetia Maxon Rama, MD   1 Units at 03/07/15 1729  . ipratropium-albuterol (DUONEB) 0.5-2.5 (3) MG/3ML nebulizer solution 3 mL  3 mL Nebulization Q6H PRN Theodis Blaze, MD      . ipratropium-albuterol (DUONEB) 0.5-2.5 (3) MG/3ML nebulizer solution 3 mL  3 mL Nebulization QID Theodis Blaze, MD   3 mL at 03/08/15 0800  . levothyroxine (SYNTHROID, LEVOTHROID) tablet 150 mcg  150 mcg Oral QAC breakfast Venetia Maxon Rama, MD   150 mcg at 03/08/15 0902  . mirtazapine (REMERON) tablet 15 mg  15 mg Oral QHS Venetia Maxon Rama, MD   15 mg at 03/07/15 2258  . morphine (MSIR) tablet 15 mg  15 mg Oral Q4H PRN Christina P Rama, MD      . nitroGLYCERIN (NITROSTAT) SL tablet 0.4 mg  0.4 mg Sublingual Q5 min PRN Christina P Rama, MD      . ondansetron (ZOFRAN) tablet 4 mg  4 mg Oral Q6H PRN Venetia Maxon Rama, MD       Or  . ondansetron (ZOFRAN) injection 4 mg  4 mg Intravenous Q6H PRN Venetia Maxon Rama, MD   4 mg at 03/03/15 0948  . pantoprazole (PROTONIX) EC tablet 40 mg  40 mg Oral Daily Venetia Maxon Rama, MD   40 mg at 03/08/15 0905  . polyethylene glycol (MIRALAX / GLYCOLAX) packet 17 g  17 g Oral Daily Theodis Blaze, MD   17 g at 03/07/15 1035  . senna-docusate (Senokot-S) tablet 1 tablet  1 tablet Oral QHS Venetia Maxon Rama, MD   1 tablet at  03/07/15 2325  . sodium chloride flush (NS) 0.9 % injection 10-40 mL  10-40 mL Intracatheter Q12H Theodis Blaze, MD   10 mL at 03/07/15 2200  . sodium chloride flush (NS) 0.9 % injection 10-40 mL  10-40 mL Intracatheter PRN Theodis Blaze, MD         Discharge Medications: Please see discharge summary for a list of discharge medications.  Relevant Imaging Results:  Relevant Lab Results:   Additional Information SSN: [id number]  Harlon Flor, Student-SW [phone]

## 2015-03-08 NOTE — Clinical Social Work Note (Signed)
Clinical Social Work Assessment  Patient Details  Name: Brittney Cunningham MRN: [id number] Date of Birth: 12-13-1940  Date of referral:  03/08/15               Reason for consult:  Facility Placement                Permission sought to share information with:  Chartered certified accountant granted to share information::  Yes, Verbal Permission Granted  Name::        Agency::  [place] [facility] SNF Search   Relationship::     Contact Information:     Housing/Transportation Living arrangements for the past 2 months:  [place] of Information:  Patient Patient Interpreter Needed:  None Criminal Activity/Legal Involvement Pertinent to Current Situation/Hospitalization:  No - Comment as needed Significant Relationships:  Adult Children, Spouse Lives with:  Spouse Do you feel safe going back to the place where you live?  Yes Need for family participation in patient care:  No (Coment)  Care giving concerns:  Pt admitted from home with spouse. PT recommending short-term rehab at a SNF.   Social Worker assessment / plan:  CSW received consult for SNF placement. BSW Intern met with pt at bedside. BSW Intern introduced self and explained role. BSW Intern explained recommendation from PT for short-term rehab at a SNF. Pt discussed living at home with husband and does not know if he will be able to take care of her. Pt asked what would happen if she declined SNF. BSW Intern explained to pt the freedom to choose and encouraged the short-term rehab is for strength, safety, and in the best interest of the pt. Pt open to exploring the option of SNF. Pt states being a resident at a SNF in [facility] called [place]. Pt requesting a [place] [facility] search as [place] would be closer for her husband and son. Pt reported she will speak to her family about this option.  Per MD, pt is not yet medically ready for dc today.   BSW Intern to  complete FL2 and complete a [place] and [facility] search. BSW Intern to follow-up with bed offers.  CSW continuing to follow.    Employment status:  Retired Nurse, adult PT Recommendations:  [place] / Referral to community resources:  [place]  Patient/Family's Response to care:  Pt alert and oriented x4. Pt involved in conversation. Pt hesitant but understanding of the need for short-term rehab at a SNF.  Patient/Family's Understanding of and Emotional Response to Diagnosis, Current Treatment, and Prognosis:  Pt reported no further questions at this time.   Emotional Assessment Appearance:  Appears stated age Attitude/Demeanor/Rapport:  Other (Appropriate ) Affect (typically observed):  Accepting, Stable Orientation:  Oriented to Self, Oriented to Place, Oriented to  Time, Oriented to Situation Alcohol / Substance use:  Not Applicable Psych involvement (Current and /or in the community):  No (Comment)  Discharge Needs  Concerns to be addressed:  No discharge needs identified Readmission within the last 30 days:  No Current discharge risk:  None Barriers to Discharge:  Continued Medical Work up   [employer], Student-SW 03/08/2015, 10:11 AM

## 2015-03-08 NOTE — Care Management Note (Signed)
Case Management Note  Patient Details  Name: Brittney Cunningham MRN: [id number] Date of Birth: [birth year]  Subjective/Objective: PT-recc SNF. CSW following.                   Action/Plan:d/c SNF.   Expected Discharge Date:                  Expected Discharge Plan:  Skilled Nursing Facility  In-House Referral:  NA, Clinical Social Work  Discharge planning Services  CM Consult  Post Acute Care Choice:  NA Choice offered to:  NA  DME Arranged:    DME Agency:     HH Arranged:    [place] Agency:     Status of Service:  In process, will continue to follow  Medicare Important Message Given:  Yes Date Medicare IM Given:    Medicare IM give by:    Date Additional Medicare IM Given:    Additional Medicare Important Message give by:     If discussed at [place] of Stay Meetings, dates discussed:    Additional Comments:  Dessa Phi, RN 03/08/2015, 3:03 PM

## 2015-03-08 NOTE — Progress Notes (Signed)
Progress Note   Brittney Cunningham [id number] DOB: [birth year] DOA: 03/02/2015 PCP: [name], MD   Brief Narrative:   75 y.o. female with a PMH of stage IV right breast cancer diagnosed May/2011 with metastasis to the bone and liver status post neoadjuvant therapy, right modified radical mastectomy 03/2010 and radiation treatment, under the care of Dr. [name] who is currently treating her for progressive disease with eribulin with last dose given 02/24/15 followed by Neulasta support, who was admitted 03/02/15 with dizziness, fatigue and upper respiratory symptoms. Upon initial presentation, she was found to be tachycardic, tachypneic with a fever of 100.8. She was admitted for possible sepsis.  Assessment/Plan:   Principal Problem:  Sepsis ([place]) - Source thought to be from HCAP. ESBL UTI - Status post fluid volume resuscitation. The pressure stable. - changed abx to primaxin to target ESBL in urine 2/3, given one dose of fosfomycin 2/4 to target ESBL - CT chest confirm LUL PNA, placed on Zithromax and Rocephin 2/4, continue same regimen for now - continue antitussives, BD's as needed  - pt looks better today, more alert and less dyspnea, encourage ambulation, IS   Active Problems:   Irregular heart sounds - tachycardia resolved, pt asking to d/c tele - no chest pain this AM, OK to d/c tele  - pt advised to let us know if she has any chest pain or shortness of breath  - keep electrolytes stable, K ~4, Mg ~2     Elevated troponin secondary to demand ischemia - in the setting of sepsis, still rather flat - no chest pain this AM - no need for further testing of troponins, d/w cardiology on call  - no indication for an intervention at this time  - please note that pt is at high risk for any blood thinners due to high risk of bleeding     Thrombocytopenia - stopped all heparin products 2/2 - SCD's for DVT prophylaxis  - Plt still slightly trending down  - CBC in AM    Hypothyroidism - Continue Synthroid.   Essential hypertension - held Coreg 2/5 due to soft BP, BP still soft this AM, continue to hold Coreg    Breast cancer metastasized to bone [facility]) and liver - Under the care of Dr. [name]. Status post chemotherapy 02/24/15 followed by Neulasta support.   Mixed collagen vascular disease ([place])    Chronic kidney disease, stage II - III - with last baseline Cr 1.4 - 1.7 in the past several months - Cr trending down  - monitor with BMP    Hyperkalemia  - given dose of Kayexalate - resolved, BMP in AM    Hypomagnesemia and hypocalcemia  - supplemented, Mg and K WNL - repeat Mg and BMP in AM   CAD (coronary artery disease), native coronary artery - Continue Lipitor and aspirin/Plavix.   Diabetes mellitus with complication (HCC) / hypoglycemia - Use insulin sensitive SSI for now until oral intake improves    Anemia of chronic renal failure, stage 4 (severe) (HCC) - Has required unit of blood transfusion while inpatient - no signs of active bleeding, Hg up from 7.8 --> 8.5 --> 7.8 --> 7.6 this AM - CBC in AM   Pain from bone metastases (HCC) - holding scheduled MS continue as pt was more lethargic 2/5 - pt more alert this AM, no pain reported - allow analgesia as needed only    DVT prophylaxis - SCD's  Family Communication/Anticipated D/C date and plan/Code  Status   Family Communication: no family present this a.m. Disposition Plan: SNF by 2/8 Anticipated D/C date:   03/09/2015. Code Status: Full code.  IV Access:    Peripheral IV  Procedures and diagnostic studies:    Dg Chest 2 View 03/02/2015 Chronic RIGHT basilar atelectasis. No acute pulmonary abnormalities. Diffuse osseous metastases.  Ct Chest Wo Contrast  03/06/2015  Posterior RUL PNA, bilateral pleural effusions with bilateral lower lobe atelectasis/consolidation, multiple lung nodules, 5 mm LLL nodule unchanged.  Medical Consultants:     None.  Anti-Infectives:    Vancomycin 03/02/15---> 2/3  Zosyn 03/02/15---> 2/3  Primaxin 2/3 --> change to fosfomycin x 1 dose  Zithromax and Rocephin 2/4 -->   Subjective:   Reports feeling better.   Objective:    Filed Vitals:   03/07/15 2124 03/08/15 0437 03/08/15 0800 03/08/15 1219  BP: 125/27 97/48    Pulse: 105 79    Temp: 97.4 F (36.3 C) 97.9 F (36.6 C)    TempSrc: Oral Oral    Resp: 16 18    Height:      Weight:  83.1 kg (183 lb 3.2 oz)    SpO2: 98% 100% 96% 95%    Intake/Output Summary (Last 24 hours) at 03/08/15 1345 Last data filed at 03/08/15 1300  Gross per 24 hour  Intake   1280 ml  Output    550 ml  Net    730 ml   Filed Weights   03/06/15 0419 03/07/15 0550 03/08/15 0437  Weight: 79.6 kg (175 lb 7.8 oz) 80.2 kg (176 lb 12.9 oz) 83.1 kg (183 lb 3.2 oz)    Exam: Gen:  NAD, more alert  Cardiovascular:  Sinus rhythm, S1/S2 Respiratory:  Lungs with diminished breath sounds at bases with less rhonchi in upper lobes bilaterally  Gastrointestinal:  Abdomen soft, NT/ND, + BS Extremities:  Trace edema in LE   Data Reviewed:    Labs: Basic Metabolic Panel:  Recent Labs Lab 03/05/15 0515 03/05/15 1830 03/06/15 0500 03/06/15 1250 03/06/15 1959 03/07/15 0405 03/08/15 0550  NA 136 136 135  --  137 135 138  K 5.5* 5.4* 5.9*  --  5.1 4.6 4.4  CL 108 107 106  --  107 106 107  CO2 19* 18* 18*  --  20* 19* 22  GLUCOSE 119* 153* 161*  --  127* 125* 95  BUN 35* 35* 35*  --  38* 37* 37*  CREATININE 1.60* 1.62* 1.70*  --  1.68* 1.65* 1.43*  CALCIUM 6.4* 6.8* 7.0*  --  7.3* 7.0* 7.3*  MG 1.4*  --   --  1.8 1.8 1.8  --    Liver Function Tests:  Recent Labs Lab 03/02/15 1120  AST 28  ALT 11*  ALKPHOS 94  BILITOT 1.4*  PROT 6.3*  ALBUMIN 2.9*   Coagulation profile  Recent Labs Lab 03/02/15 1654  INR 1.34    CBC:  Recent Labs Lab 03/02/15 1120  03/04/15 0439 03/05/15 0515 03/06/15 0500 03/07/15 0405 03/08/15 0550   WBC 6.0  < > 5.5 7.2 8.7 7.0 6.7  NEUTROABS 3.9  --   --   --   --   --   --   HGB 8.9*  < > 8.3* 7.8* 8.5* 7.8* 7.6*  HCT 29.0*  < > 26.6* 25.5* 27.6* 25.3* 25.1*  MCV 92.9  < > 94.0 95.1 94.2 92.7 95.1  PLT 192  < > 140* 118* 114* 97* 94*  < > =  values in this interval not displayed. Sepsis Labs:  Recent Labs Lab 03/02/15 1459 03/02/15 1654 03/02/15 2154  03/05/15 0515 03/06/15 0500 03/06/15 1955 03/07/15 0405 03/08/15 0550  PROCALCITON  --  2.93  --   --   --   --   --   --   --   WBC  --  3.9*  --   < > 7.2 8.7  --  7.0 6.7  LATICACIDVEN 2.26*  --  2.5*  --  2.2*  --  2.3*  --   --   < > = values in this interval not displayed. Microbiology Recent Results (from the past 240 hour(s))  Culture, blood (routine x 2)     Status: None   Collection Time: 03/02/15 11:20 AM  Result Value Ref Range Status   Specimen Description BLOOD LEFT ANTECUBITAL  Final   Special Requests BOTTLES DRAWN AEROBIC AND ANAEROBIC 5CC EACH  Final   Culture   Final    NO GROWTH 5 DAYS Performed at [facility]    Report Status 03/07/2015 FINAL  Final  Culture, blood (routine x 2)     Status: None   Collection Time: 03/02/15 11:50 AM  Result Value Ref Range Status   Specimen Description BLOOD RIGHT PORTA CATH  Final   Special Requests BOTTLES DRAWN AEROBIC AND ANAEROBIC 10CC EACH  Final   Culture   Final    NO GROWTH 5 DAYS Performed at [facility]    Report Status 03/07/2015 FINAL  Final  MRSA PCR Screening     Status: None   Collection Time: 03/02/15  4:12 PM  Result Value Ref Range Status   MRSA by PCR NEGATIVE NEGATIVE Final    Comment:        The GeneXpert MRSA Assay (FDA approved for NASAL specimens only), is one component of a comprehensive MRSA colonization surveillance program. It is not intended to diagnose MRSA infection nor to guide or monitor treatment for MRSA infections.   Respiratory virus panel     Status: None   Collection Time: 03/02/15  6:46 PM   Result Value Ref Range Status   Respiratory Syncytial Virus A Negative Negative Final   Respiratory Syncytial Virus B Negative Negative Final   Influenza A Negative Negative Final   Influenza B Negative Negative Final   Parainfluenza 1 Negative Negative Final   Parainfluenza 2 Negative Negative Final   Parainfluenza 3 Negative Negative Final   Metapneumovirus Negative Negative Final   Rhinovirus Negative Negative Final   Adenovirus Negative Negative Final    Comment: (NOTE) Performed At: [facility] [address] [place], [place] [id number] Lindon Romp MD [id number]   Urine culture     Status: None   Collection Time: 03/02/15  9:57 PM  Result Value Ref Range Status   Specimen Description URINE, CLEAN CATCH  Final   Special Requests NONE  Final   Culture   Final    40,000 COLONIES/ml ESCHERICHIA COLI Confirmed Extended Spectrum Beta-Lactamase Producer (ESBL) Performed at [facility]    Report Status 03/05/2015 FINAL  Final   Organism ID, Bacteria ESCHERICHIA COLI  Final      Susceptibility   Escherichia coli - MIC*    AMPICILLIN >=32 RESISTANT Resistant     CEFAZOLIN >=64 RESISTANT Resistant     CEFTRIAXONE >=64 RESISTANT Resistant     CIPROFLOXACIN >=4 RESISTANT Resistant     GENTAMICIN <=1 SENSITIVE Sensitive     IMIPENEM <=0.25  SENSITIVE Sensitive     NITROFURANTOIN <=16 SENSITIVE Sensitive     TRIMETH/SULFA >=320 RESISTANT Resistant     AMPICILLIN/SULBACTAM >=32 RESISTANT Resistant     PIP/TAZO 64 INTERMEDIATE Intermediate     * 40,000 COLONIES/ml ESCHERICHIA COLI     Medications:   . aspirin  81 mg Oral Daily  . atorvastatin  80 mg Oral Daily  . azithromycin  500 mg Intravenous Q24H  . calcium carbonate  1 tablet Oral TID WC  . cefTRIAXone (ROCEPHIN)  IV  1 g Intravenous Q24H  . clopidogrel  75 mg Oral Daily  . cyanocobalamin  1,000 mcg Intramuscular Q30 days  . gabapentin  100 mg Oral TID  . guaiFENesin  600 mg Oral BID  .  insulin aspart  0-5 Units Subcutaneous QHS  . insulin aspart  0-9 Units Subcutaneous TID WC  . ipratropium-albuterol  3 mL Nebulization QID  . levothyroxine  150 mcg Oral QAC breakfast  . mirtazapine  15 mg Oral QHS  . pantoprazole  40 mg Oral Daily  . polyethylene glycol  17 g Oral Daily  . senna-docusate  1 tablet Oral QHS  . sodium chloride flush  10-40 mL Intracatheter Q12H   Continuous Infusions:    Time spent: 35 minutes.  The patient is medically complex with multiple co-morbidities and is at high risk for clinical deterioration and requires high complexity decision making.   LOS: 6 days   Faye Ramsay  [facility] Pager [phone]. If unable to reach me by pager, please call my cell phone at [phone]  Please refer to [email or web address], password [email or web address] to get updated schedule on who will round on this patient, as hospitalists switch teams weekly. If 7PM-7AM, please contact night-coverage at [email or web address], password [email or web address] for any overnight needs.  03/08/2015, 1:45 PM

## 2015-03-08 NOTE — Care Management Important Message (Signed)
Important Message  Patient Details  Name: Brittney Cunningham MRN: [id number] Date of Birth: 12/10/1940   Medicare Important Message Given:  Yes    Camillo Flaming 03/08/2015, 10:17 AMImportant Message  Patient Details  Name: Brittney Cunningham MRN: [id number] Date of Birth: 12-08-1940   Medicare Important Message Given:  Yes    Camillo Flaming 03/08/2015, 10:17 AM

## 2015-03-09 LAB — BASIC METABOLIC PANEL
ANION GAP: 8 (ref 5–15)
BUN: 31 mg/dL — ABNORMAL HIGH (ref 6–20)
CO2: 22 mmol/L (ref 22–32)
Calcium: 7.7 mg/dL — ABNORMAL LOW (ref 8.9–10.3)
Chloride: 109 mmol/L (ref 101–111)
Creatinine, Ser: 1.07 mg/dL — ABNORMAL HIGH (ref 0.44–1.00)
GFR calc Af Amer: 59 mL/min — ABNORMAL LOW (ref >60)
GFR calc non Af Amer: 51 mL/min — ABNORMAL LOW (ref >60)
GLUCOSE: 106 mg/dL — AB (ref 65–99)
Potassium: 4.4 mmol/L (ref 3.5–5.1)
Sodium: 139 mmol/L (ref 135–145)

## 2015-03-09 LAB — GLUCOSE, CAPILLARY
GLUCOSE-CAPILLARY: 109 mg/dL — AB (ref 65–99)
GLUCOSE-CAPILLARY: 110 mg/dL — AB (ref 65–99)
GLUCOSE-CAPILLARY: 131 mg/dL — AB (ref 65–99)
Glucose-Capillary: 127 mg/dL — ABNORMAL HIGH (ref 65–99)

## 2015-03-09 LAB — CBC
HCT: 26.2 % — ABNORMAL LOW (ref 36.0–46.0)
Hemoglobin: 8 g/dL — ABNORMAL LOW (ref 12.0–15.0)
MCH: 28.6 pg (ref 26.0–34.0)
MCHC: 30.5 g/dL (ref 30.0–36.0)
MCV: 93.6 fL (ref 78.0–100.0)
PLATELETS: 103 10*3/uL — AB (ref 150–400)
RBC: 2.8 MIL/uL — ABNORMAL LOW (ref 3.87–5.11)
RDW: 16.6 % — AB (ref 11.5–15.5)
WBC: 8 10*3/uL (ref 4.0–10.5)

## 2015-03-09 LAB — MAGNESIUM: Magnesium: 1.7 mg/dL (ref 1.7–2.4)

## 2015-03-09 MED ORDER — PROMETHAZINE HCL 25 MG/ML IJ SOLN
12.5000 mg | Freq: Once | INTRAMUSCULAR | Status: AC
  Administered 2015-03-09: 12.5 mg via INTRAVENOUS
  Filled 2015-03-09: qty 1

## 2015-03-09 MED ORDER — AZITHROMYCIN 250 MG PO TABS
500.0000 mg | ORAL_TABLET | Freq: Every day | ORAL | Status: DC
  Administered 2015-03-09: 500 mg via ORAL
  Filled 2015-03-09: qty 1

## 2015-03-09 MED ORDER — LORAZEPAM 2 MG/ML IJ SOLN
1.0000 mg | Freq: Once | INTRAMUSCULAR | Status: AC
  Administered 2015-03-09: 1 mg via INTRAVENOUS
  Filled 2015-03-09: qty 1

## 2015-03-09 NOTE — Progress Notes (Signed)
Progress Note   Brittney Cunningham [id number] DOB: 1940/04/09 DOA: 03/02/2015 PCP: [name], MD   Brief Narrative:   75 y.o. female with a PMH of stage IV right breast cancer diagnosed May/2011 with metastasis to the bone and liver status post neoadjuvant therapy, right modified radical mastectomy 03/2010 and radiation treatment, under the care of Dr. [name] who is currently treating her for progressive disease with eribulin with last dose given 02/24/15 followed by Neulasta support, who was admitted 03/02/15 with dizziness, fatigue and upper respiratory symptoms. Upon initial presentation, she was found to be tachycardic, tachypneic with a fever of 100.8. She was admitted for possible sepsis.  Assessment/Plan:   Principal Problem:  Sepsis ([place]) - Source thought to be from HCAP. ESBL UTI - Status post fluid volume resuscitation. The pressure stable. - changed abx to primaxin to target ESBL in urine 2/3, given one dose of fosfomycin 2/4 to target ESBL, the therapy now completed  - CT chest 2/4 confirmed LUL PNA, placed on Zithromax and Rocephin 2/4, continue same regimen for now, today is day #4 of Zithro and Rocephin  - continue antitussives, BD's as needed  - pt reports feeling worse today, tired but denies chest pain or shortness of breath   Active Problems:   Irregular heart sounds - tachycardia resolved, pt asking to d/c tele, will respect wishes  - no chest pain this AM but again, pt reports feeling tired and worse this AM  - pt advised to let us know if she has any chest pain or shortness of breath  - keep electrolytes stable, K ~4, Mg ~2     Elevated troponin secondary to demand ischemia - in the setting of sepsis, still rather flat - no chest pain this AM - no need for further testing of troponins, d/w cardiology on call  - no indication for an intervention at this time  - please note that pt is at high risk for any blood thinners due to high risk of bleeding      Thrombocytopenia - stopped all heparin products 2/2 - SCD's for DVT prophylaxis  - Plt still slightly trending up - CBC in AM   Hypothyroidism - Continue Synthroid.   Essential hypertension - held Coreg 2/5 due to soft BP, BP better this AM but will continue to hold Coreg for now - may resume in AM   Breast cancer metastasized to bone [facility]) and liver - Under the care of Dr. [name]. Status post chemotherapy 02/24/15 followed by Neulasta support. - spoke with Dr. 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   Diabetes mellitus with complication (HCC) / hypoglycemia - Use insulin sensitive SSI for now until oral intake improves    Anemia of chronic renal failure, stage 4 (severe) (HCC) - Has required unit of blood transfusion while inpatient - no signs of active bleeding, Hg up from 7.8 --> 8.5 --> 7.8 -->  7.6 --> 8.0 this AM - CBC in AM   Pain from bone metastases (HCC) - holding scheduled MS continue as pt was more lethargic 2/5 - pt more alert this AM, no pain reported but says she feels worse  - allow analgesia as needed only    DVT prophylaxis - SCD's  Family Communication/Anticipated D/C date and plan/Code Status   Family Communication: no family present this a.m.  Disposition Plan: SNF by 2/10, unable to d/c at this time as pt says she feels worse but unable to explain why  Code Status: Full code.  IV Access:    Peripheral IV  Procedures and  diagnostic studies:    Dg Chest 2 View 03/02/2015 Chronic RIGHT basilar atelectasis. No acute pulmonary abnormalities. Diffuse osseous metastases.  Ct Chest Wo Contrast  03/06/2015  Posterior RUL PNA, bilateral pleural effusions with bilateral lower lobe atelectasis/consolidation, multiple lung nodules, 5 mm LLL nodule unchanged.  Medical Consultants:    None.  Anti-Infectives:    Vancomycin 03/02/15---> 2/3  Zosyn 03/02/15---> 2/3  Primaxin 2/3 --> change to fosfomycin x 1 dose  Zithromax and Rocephin 2/4 -->   Subjective:   Reports feeling worse this AM, no chest pain or shortness of breath, had BM this AM.   Objective:    Filed Vitals:   03/09/15 1100 03/09/15 1235 03/09/15 1308 03/09/15 1611  BP: 134/76  136/92   Pulse: 80  88   Temp: 97.4 F (36.3 C)  97.8 F (36.6 C)   TempSrc: Oral  Oral   Resp: 18     Height:      Weight:      SpO2: 96% 96% 97% 93%    Intake/Output Summary (Last 24 hours) at 03/09/15 1726 Last data filed at 03/09/15 1500  Gross per 24 hour  Intake    840 ml  Output      0 ml  Net    840 ml   Filed Weights   03/07/15 0550 03/08/15 0437 03/09/15 0433  Weight: 80.2 kg (176 lb 12.9 oz) 83.1 kg (183 lb 3.2 oz) 82.1 kg (181 lb)    Exam: Gen:  NAD, more alert, appears tired and weak  Cardiovascular:  Sinus rhythm, S1/S2 Respiratory:  Lungs with diminished breath sounds at bases with less rhonchi, no wheezing  Gastrointestinal:  Abdomen soft, NT/ND, + BS Extremities:  Trace edema in LE   Data Reviewed:    Labs: Basic Metabolic Panel:  Recent Labs Lab 03/05/15 0515  03/06/15 0500 03/06/15 1250 03/06/15 1959 03/07/15 0405 03/08/15 0550 03/09/15 0504  NA 136  < > 135  --  137 135 138 139  K 5.5*  < > 5.9*  --  5.1 4.6 4.4 4.4  CL 108  < > 106  --  107 106 107 109  CO2 19*  < > 18*  --  20* 19* 22 22  GLUCOSE 119*  < > 161*  --  127* 125* 95 106*  BUN 35*  < > 35*  --  38* 37* 37* 31*  CREATININE 1.60*  < > 1.70*  --  1.68*  1.65* 1.43* 1.07*  CALCIUM 6.4*  < > 7.0*  --  7.3* 7.0* 7.3* 7.7*  MG 1.4*  --   --  1.8 1.8 1.8  --  1.7  < > = values in this interval not displayed.  CBC:  Recent Labs Lab 03/05/15 0515 03/06/15 0500 03/07/15 0405 03/08/15 0550 03/09/15 0504  WBC 7.2 8.7 7.0  6.7 8.0  HGB 7.8* 8.5* 7.8* 7.6* 8.0*  HCT 25.5* 27.6* 25.3* 25.1* 26.2*  MCV 95.1 94.2 92.7 95.1 93.6  PLT 118* 114* 97* 94* 103*   Sepsis Labs:  Recent Labs Lab 03/02/15 2154  03/05/15 0515 03/06/15 0500 03/06/15 1955 03/07/15 0405 03/08/15 0550 03/09/15 0504  WBC  --   < > 7.2 8.7  --  7.0 6.7 8.0  LATICACIDVEN 2.5*  --  2.2*  --  2.3*  --   --   --   < > = values in this interval not displayed. Microbiology Recent Results (from the past 240 hour(s))  Culture, blood (routine x 2)     Status: None   Collection Time: 03/02/15 11:20 AM  Result Value Ref Range Status   Specimen Description BLOOD LEFT ANTECUBITAL  Final   Special Requests BOTTLES DRAWN AEROBIC AND ANAEROBIC 5CC EACH  Final   Culture   Final    NO GROWTH 5 DAYS Performed at [facility]    Report Status 03/07/2015 FINAL  Final  Culture, blood (routine x 2)     Status: None   Collection Time: 03/02/15 11:50 AM  Result Value Ref Range Status   Specimen Description BLOOD RIGHT PORTA CATH  Final   Special Requests BOTTLES DRAWN AEROBIC AND ANAEROBIC 10CC EACH  Final   Culture   Final    NO GROWTH 5 DAYS Performed at [facility]    Report Status 03/07/2015 FINAL  Final  MRSA PCR Screening     Status: None   Collection Time: 03/02/15  4:12 PM  Result Value Ref Range Status   MRSA by PCR NEGATIVE NEGATIVE Final    Comment:        The GeneXpert MRSA Assay (FDA approved for NASAL specimens only), is one component of a comprehensive MRSA colonization surveillance program. It is not intended to diagnose MRSA infection nor to guide or monitor treatment for MRSA infections.   Respiratory virus panel     Status: None    Collection Time: 03/02/15  6:46 PM  Result Value Ref Range Status   Respiratory Syncytial Virus A Negative Negative Final   Respiratory Syncytial Virus B Negative Negative Final   Influenza A Negative Negative Final   Influenza B Negative Negative Final   Parainfluenza 1 Negative Negative Final   Parainfluenza 2 Negative Negative Final   Parainfluenza 3 Negative Negative Final   Metapneumovirus Negative Negative Final   Rhinovirus Negative Negative Final   Adenovirus Negative Negative Final    Comment: (NOTE) Performed At: [facility] [address] [place], [place] [id number] Lindon Romp MD [id number]   Urine culture     Status: None   Collection Time: 03/02/15  9:57 PM  Result Value Ref Range Status   Specimen Description URINE, CLEAN CATCH  Final   Special Requests NONE  Final   Culture   Final    40,000 COLONIES/ml ESCHERICHIA COLI Confirmed Extended Spectrum Beta-Lactamase Producer (ESBL) Performed at [facility]    Report Status 03/05/2015 FINAL  Final   Organism ID, Bacteria ESCHERICHIA COLI  Final      Susceptibility   Escherichia coli - MIC*    AMPICILLIN >=32 RESISTANT Resistant     CEFAZOLIN >=64 RESISTANT Resistant     CEFTRIAXONE >=64 RESISTANT Resistant     CIPROFLOXACIN >=4 RESISTANT Resistant     GENTAMICIN <=1 SENSITIVE Sensitive     IMIPENEM <=0.25 SENSITIVE Sensitive     NITROFURANTOIN <=  16 SENSITIVE Sensitive     TRIMETH/SULFA >=320 RESISTANT Resistant     AMPICILLIN/SULBACTAM >=32 RESISTANT Resistant     PIP/TAZO 64 INTERMEDIATE Intermediate     * 40,000 COLONIES/ml ESCHERICHIA COLI     Medications:   . aspirin  81 mg Oral Daily  . atorvastatin  80 mg Oral Daily  . azithromycin  500 mg Oral QHS  . calcium carbonate  1 tablet Oral TID WC  . cefTRIAXone (ROCEPHIN)  IV  1 g Intravenous Q24H  . clopidogrel  75 mg Oral Daily  . cyanocobalamin  1,000 mcg Intramuscular Q30 days  . gabapentin  100 mg Oral BID  . guaiFENesin   600 mg Oral BID  . insulin aspart  0-5 Units Subcutaneous QHS  . insulin aspart  0-9 Units Subcutaneous TID WC  . ipratropium-albuterol  3 mL Nebulization QID  . levothyroxine  150 mcg Oral QAC breakfast  . mirtazapine  15 mg Oral QHS  . pantoprazole  40 mg Oral Daily  . polyethylene glycol  17 g Oral Daily  . senna-docusate  1 tablet Oral QHS  . sodium chloride flush  10-40 mL Intracatheter Q12H   Continuous Infusions:    Time spent: 35 minutes.  The patient is medically complex with multiple co-morbidities and is at high risk for clinical deterioration and requires high complexity decision making.   LOS: 7 days   Faye Ramsay  [facility] Pager [phone]. If unable to reach me by pager, please call my cell phone at [phone]  Please refer to [email or web address], password [email or web address] to get updated schedule on who will round on this patient, as hospitalists switch teams weekly. If 7PM-7AM, please contact night-coverage at [email or web address], password [email or web address] for any overnight needs.  03/09/2015, 5:26 PM

## 2015-03-09 NOTE — Progress Notes (Signed)
BSW Intern continuing to follow.  BSW Intern confirmed with [facility] they would be able to provide pt a bed.  BSW Intern met with pt at bedside to provide bed offers. Pt chooses bed at [facility]. Pt states she will notify her family.  Pt stated meeting with MD this morning who reported pt is not yet medically for dc. Pt reports not feeling well today.  BSW Intern contacted [facility] [facility] to accept bed offer and notify that pt is not yet medically ready for dc today.  CSW to continue to follow and provide disposition needs.

## 2015-03-09 NOTE — Progress Notes (Signed)
Occupational Therapy Treatment Patient Details Name: LASHIRA DEFARIA MRN: [id number] DOB: [birth year] Today's Date: 03/09/2015    History of present illness 75 yo female admitted with onset of nasal congestion, rhinorrhea, cough, and worsening fatigue. Dx of HCAP, UTI, sepsis.  PMH of stage IV right breast cancer diagnosed May/2011 with metastasis to the bone and liver status post neoadjuvant therapy, right modified radical mastectomy 03/2010 and radiation treatment    OT comments  Pt has poor endurance but was willing to get up and work with OT.    Follow Up Recommendations  SNF    Equipment Recommendations  3 in 1 bedside comode    Recommendations for Other Services      Precautions / Restrictions Precautions Precautions: Fall Precaution Comments: recent dizziness at home       Mobility Bed Mobility         Supine to sit: Min guard     General bed mobility comments: used bedrails  Transfers   Equipment used: Rolling walker (2 wheeled) Transfers: Sit to/from Stand Sit to Stand: Min guard         General transfer comment: for safety; cues for UE placement    Balance           Standing balance support: Bilateral upper extremity supported Standing balance-Leahy Scale: Poor                     ADL                                         General ADL Comments: had planned to walk to sink for grooming. Walked around bed and pt very fatiqued.  Sat in chair and pt wanted to stay there.  Min guard to ambulate around bed.  Sats 95% on RA and HR up to 131      Vision                     Perception     Praxis      Cognition   Behavior During Therapy: [facility] for tasks assessed/performed Overall Cognitive Status: Within Functional Limits for tasks assessed                       Extremity/Trunk Assessment               Exercises     Shoulder Instructions       General Comments      Pertinent Vitals/  Pain       Pain Assessment: No/denies pain  Home Living                                          Prior Functioning/Environment              Frequency Min 2X/week     Progress Toward Goals  OT Goals(current goals can now be found in the care plan section)  Progress towards OT goals: Progressing toward goals     Plan      Co-evaluation                 End of Session     Activity Tolerance Patient limited by fatigue   Patient Left in bed;with call bell/phone within  reach;with chair alarm set   Nurse Communication Mobility status        Time: [id number] OT Time Calculation (min): 20 min  Charges: OT General Charges $OT Visit: 1 Procedure OT Treatments $Therapeutic Activity: 8-22 mins  Dima Mini 03/09/2015, 2:37 PM  Lesle Chris, OTR/L [phone] 03/09/2015

## 2015-03-09 NOTE — Progress Notes (Signed)
PHARMACIST - PHYSICIAN COMMUNICATION DR:   Doyle Askew CONCERNING: Antibiotic IV to Oral Route Change Policy  RECOMMENDATION: This patient is receiving Azithromycin by the intravenous route.  Based on criteria approved by the Pharmacy and Therapeutics Committee, the antibiotic(s) is/are being converted to the equivalent oral dose form(s).   DESCRIPTION: These criteria include:  Patient being treated for a respiratory tract infection, urinary tract infection, cellulitis or clostridium difficile associated diarrhea if on metronidazole  The patient is not neutropenic and does not exhibit a GI malabsorption state  The patient is eating (either orally or via tube) and/or has been taking other orally administered medications for a least 24 hours  The patient is improving clinically and has a Tmax < 100.5  If you have questions about this conversion, please contact the Pharmacy Department  []   [phone] )  [facility] []   [phone] )  [facility] []   [phone] )  [facility] []   [phone] )  [facility] [x]   [phone] )  [facility]

## 2015-03-10 ENCOUNTER — Ambulatory Visit: Payer: Self-pay

## 2015-03-10 ENCOUNTER — Other Ambulatory Visit: Payer: Self-pay

## 2015-03-10 ENCOUNTER — Inpatient Hospital Stay (HOSPITAL_COMMUNITY): Payer: Medicare Other

## 2015-03-10 ENCOUNTER — Ambulatory Visit: Payer: Self-pay | Admitting: Nurse Practitioner

## 2015-03-10 DIAGNOSIS — G9340 Encephalopathy, unspecified: Secondary | ICD-10-CM

## 2015-03-10 DIAGNOSIS — R4182 Altered mental status, unspecified: Secondary | ICD-10-CM

## 2015-03-10 DIAGNOSIS — J189 Pneumonia, unspecified organism: Secondary | ICD-10-CM

## 2015-03-10 DIAGNOSIS — C50919 Malignant neoplasm of unspecified site of unspecified female breast: Secondary | ICD-10-CM

## 2015-03-10 DIAGNOSIS — R0600 Dyspnea, unspecified: Secondary | ICD-10-CM

## 2015-03-10 LAB — GLUCOSE, CAPILLARY
GLUCOSE-CAPILLARY: 115 mg/dL — AB (ref 65–99)
GLUCOSE-CAPILLARY: 126 mg/dL — AB (ref 65–99)
GLUCOSE-CAPILLARY: 158 mg/dL — AB (ref 65–99)
Glucose-Capillary: 142 mg/dL — ABNORMAL HIGH (ref 65–99)
Glucose-Capillary: 121 mg/dL — ABNORMAL HIGH (ref 65–99)

## 2015-03-10 LAB — LACTIC ACID, PLASMA
LACTIC ACID, VENOUS: 2.8 mmol/L — AB (ref 0.5–2.0)
Lactic Acid, Venous: 3.6 mmol/L (ref 0.5–2.0)
Lactic Acid, Venous: 3.3 mmol/L (ref 0.5–2.0)

## 2015-03-10 LAB — CBC
HCT: 28.5 % — ABNORMAL LOW (ref 36.0–46.0)
HEMOGLOBIN: 9 g/dL — AB (ref 12.0–15.0)
MCH: 28.7 pg (ref 26.0–34.0)
MCHC: 31.6 g/dL (ref 30.0–36.0)
MCV: 90.8 fL (ref 78.0–100.0)
PLATELETS: 124 10*3/uL — AB (ref 150–400)
RBC: 3.14 MIL/uL — AB (ref 3.87–5.11)
RDW: 16.5 % — ABNORMAL HIGH (ref 11.5–15.5)
WBC: 15.5 10*3/uL — AB (ref 4.0–10.5)

## 2015-03-10 LAB — MAGNESIUM: MAGNESIUM: 1.6 mg/dL — AB (ref 1.7–2.4)

## 2015-03-10 LAB — BLOOD GAS, ARTERIAL
ACID-BASE DEFICIT: 10 mmol/L — AB (ref 0.0–2.0)
Bicarbonate: 14.3 mEq/L — ABNORMAL LOW (ref 20.0–24.0)
DRAWN BY: 441261
FIO2: 0.21
O2 Saturation: 92.7 %
PCO2 ART: 27.1 mmHg — AB (ref 35.0–45.0)
PH ART: 7.341 — AB (ref 7.350–7.450)
PO2 ART: 79.2 mmHg — AB (ref 80.0–100.0)
Patient temperature: 37
TCO2: 13.6 mmol/L (ref 0–100)

## 2015-03-10 LAB — BASIC METABOLIC PANEL
ANION GAP: 14 (ref 5–15)
BUN: 25 mg/dL — AB (ref 6–20)
CHLORIDE: 108 mmol/L (ref 101–111)
CO2: 17 mmol/L — ABNORMAL LOW (ref 22–32)
Calcium: 7.9 mg/dL — ABNORMAL LOW (ref 8.9–10.3)
Creatinine, Ser: 1.02 mg/dL — ABNORMAL HIGH (ref 0.44–1.00)
GFR calc Af Amer: >60 mL/min (ref >60)
GFR, EST NON AFRICAN AMERICAN: 54 mL/min — AB (ref >60)
Glucose, Bld: 166 mg/dL — ABNORMAL HIGH (ref 65–99)
POTASSIUM: 5.1 mmol/L (ref 3.5–5.1)
SODIUM: 139 mmol/L (ref 135–145)

## 2015-03-10 LAB — TROPONIN I
TROPONIN I: 0.15 ng/mL — AB (ref <0.031)
Troponin I: 0.16 ng/mL — ABNORMAL HIGH (ref <0.031)
Troponin I: 0.25 ng/mL — ABNORMAL HIGH (ref <0.031)

## 2015-03-10 LAB — PROCALCITONIN: Procalcitonin: 0.39 ng/mL

## 2015-03-10 LAB — TSH: TSH: 7.774 u[IU]/mL — ABNORMAL HIGH (ref 0.350–4.500)

## 2015-03-10 MED ORDER — SODIUM CHLORIDE 0.9 % IV BOLUS (SEPSIS)
500.0000 mL | Freq: Once | INTRAVENOUS | Status: AC
  Administered 2015-03-10: 500 mL via INTRAVENOUS

## 2015-03-10 MED ORDER — VANCOMYCIN HCL IN DEXTROSE 750-5 MG/150ML-% IV SOLN
750.0000 mg | Freq: Two times a day (BID) | INTRAVENOUS | Status: DC
  Administered 2015-03-10 – 2015-03-12 (×5): 750 mg via INTRAVENOUS
  Filled 2015-03-10 (×6): qty 150

## 2015-03-10 MED ORDER — MORPHINE SULFATE 15 MG PO TABS
15.0000 mg | ORAL_TABLET | Freq: Four times a day (QID) | ORAL | Status: DC | PRN
  Administered 2015-03-13 – 2015-03-20 (×5): 15 mg via ORAL
  Filled 2015-03-10 (×6): qty 1

## 2015-03-10 MED ORDER — MIRTAZAPINE 7.5 MG PO TABS
7.5000 mg | ORAL_TABLET | Freq: Every day | ORAL | Status: DC
  Administered 2015-03-10 – 2015-03-22 (×11): 7.5 mg via ORAL
  Filled 2015-03-10 (×13): qty 1

## 2015-03-10 MED ORDER — METOPROLOL TARTRATE 1 MG/ML IV SOLN
INTRAVENOUS | Status: AC
  Administered 2015-03-10: 5 mg
  Filled 2015-03-10: qty 5

## 2015-03-10 MED ORDER — IPRATROPIUM-ALBUTEROL 0.5-2.5 (3) MG/3ML IN SOLN: 3.0000 mL | RESPIRATORY_TRACT | Status: DC | PRN

## 2015-03-10 MED ORDER — MAGNESIUM SULFATE 2 GM/50ML IV SOLN
2.0000 g | Freq: Once | INTRAVENOUS | Status: AC
  Administered 2015-03-10: 2 g via INTRAVENOUS
  Filled 2015-03-10: qty 50

## 2015-03-10 MED ORDER — PANTOPRAZOLE SODIUM 40 MG IV SOLR
40.0000 mg | INTRAVENOUS | Status: DC
  Administered 2015-03-10 – 2015-03-18 (×9): 40 mg via INTRAVENOUS
  Filled 2015-03-10 (×11): qty 40

## 2015-03-10 MED ORDER — SODIUM CHLORIDE 0.9 % IV SOLN
500.0000 mg | Freq: Three times a day (TID) | INTRAVENOUS | Status: DC
  Administered 2015-03-10 – 2015-03-15 (×15): 500 mg via INTRAVENOUS
  Filled 2015-03-10 (×18): qty 500

## 2015-03-10 NOTE — Progress Notes (Addendum)
CRITICAL VALUE ALERT  Critical value received:  Lactic acid 3.3  Date of notification:  03/10/15  Time of notification:  [id number]  Critical value read back: yes  Nurse who received alert:  Gae Gallop, RN  MD notified (1st page):  Doyle Askew  Time of first page:  1455  MD notified (2nd page):  Time of second page:  Responding MD:  Doyle Askew  Time MD responded:  [phone]

## 2015-03-10 NOTE — Significant Event (Signed)
Rapid Response Event Note  Overview: Time Called: 0045 Event Type: Cardiac, Other (Comment) (AMS)  Initial Focused Assessment: Pt restless, lying in bed, eyes open looking around room, at first non-verbal with just sounds heard from mouth, moving all four extremities with equal strength but not to command.  Pupils equal round and reactive size 60mm,   Pt connected to rapid response monitor.  ST/A-fib rate 140-150s.  12 lead EKG done prior to my arrival.  Computer DX on 12-lead showed septal infarct age undetermined.  I personally did not see any significant ST-elevation.  Due to Epic downtime unable to view past 12-lead EKGs.  Curt Bears ([facility] NP) had been notified of the restlessness earlier and had ordered Ativan 1mg  IV, which was given.  Other VS all WNL (see documentation).  CBG also taken which was WNL.  After being in room approx. 15-20 minutes, pt did speak a few words that I was able to understand.  During this time I was able understand her to deny any chest pain or pain in general.  Interventions: 12-Lead EKG - done prior to my arrival Connected to RRT monitor:  EKG, BP, Pulse Ox CBG - WNL Discussed situation with Curt Bears from [facility] Ordered:  Troponin now and q 6 x 3 / Lopressor 5mg  IV for HR / Telemetry  Event Summary: Notified Curt Bears from [facility] at [place] RN for [place] Coordinator [facility] ICU/SD Unit

## 2015-03-10 NOTE — Progress Notes (Signed)
Patient with complaints at the beginning of the shift of nausea and dry heaving.  Medicated with prn Zofran with some improvement but symptoms returned.  NP on call notified and pt given IV Phenergan per new order.  Pt noted to have increase HR on night time vitals with rate up to 140's. Patient also extremely restless.  EKG performed. One said Afib with RVR a second one showed ST.  NP on call notified and informed that pt not on telemetry.  New order received to give 1mg  of IV ativan.  Will continue to closely monitor pt.

## 2015-03-10 NOTE — Progress Notes (Signed)
Brittney Cunningham   DOB:September 15, 1940   FK#:[id number]   FVC#:[id number]  Subjective:  Brittney Cunningham is confused this AM. She had a difficult night per notes and received phenergan and lorazepam this AM. No family in room  Objective: older White woman examined in bed Filed Vitals:   03/10/15 0346 03/10/15 0506  BP: 132/85 131/70  Pulse: 132 136  Temp:  97.3 F (36.3 C)  Resp: 24 22    Body mass index is 32.7 kg/(m^2).  Intake/Output Summary (Last 24 hours) at 03/10/15 0851 Last data filed at 03/09/15 2051  Gross per 24 hour  Intake    590 ml  Output      0 ml  Net    590 ml     No peripheral adenopathy  Lungs clear -- auscultated anterolaterally  Heart rapid rate  Abdomen soft, NT, +BS  Neuro initially "1944" but after re-oriented and a few minutes passed was able to tell me "2017;" recognizes me and started to say my name, "Gu--") then fell asleep;  Breast exam: deferred  CBG (last 3)   Recent Labs  03/09/15 2127 03/10/15 0053 03/10/15 0808  GLUCAP 127* 158* 142*     Labs:  Lab Results  Component Value Date   WBC 15.5* 03/10/2015   HGB 9.0* 03/10/2015   HCT 28.5* 03/10/2015   MCV 90.8 03/10/2015   PLT 124* 03/10/2015   NEUTROABS 3.9 03/02/2015    '@LASTCHEMISTRY'$ @  Urine Studies No results for input(s): UHGB, CRYS in the last 72 hours.  Invalid input(s): UACOL, UAPR, USPG, UPH, UTP, UGL, [place], UBIL, UNIT, UROB, [place], UEPI, UWBC, Junie Panning [place], [place], [place]  Basic Metabolic Panel:  Recent Labs Lab 03/06/15 1250 03/06/15 1959 03/07/15 0405 03/08/15 0550 03/09/15 0504 03/10/15 0145  NA  --  137 135 138 139 139  K  --  5.1 4.6 4.4 4.4 5.1  CL  --  107 106 107 109 108  CO2  --  20* 19* 22 22 17*  GLUCOSE  --  127* 125* 95 106* 166*  BUN  --  38* 37* 37* 31* 25*  CREATININE  --  1.68* 1.65* 1.43* 1.07* 1.02*  CALCIUM  --  7.3* 7.0* 7.3* 7.7* 7.9*  MG 1.8 1.8 1.8  --  1.7 1.6*   GFR Estimated Creatinine Clearance: 51.1 mL/min (by C-G formula based on Cr of  1.02). Liver Function Tests: No results for input(s): AST, ALT, ALKPHOS, BILITOT, PROT, ALBUMIN in the last 168 hours. No results for input(s): LIPASE, AMYLASE in the last 168 hours. No results for input(s): AMMONIA in the last 168 hours. Coagulation profile No results for input(s): INR, PROTIME in the last 168 hours.  CBC:  Recent Labs Lab 03/06/15 0500 03/07/15 0405 03/08/15 0550 03/09/15 0504 03/10/15 0145  WBC 8.7 7.0 6.7 8.0 15.5*  HGB 8.5* 7.8* 7.6* 8.0* 9.0*  HCT 27.6* 25.3* 25.1* 26.2* 28.5*  MCV 94.2 92.7 95.1 93.6 90.8  PLT 114* 97* 94* 103* 124*   Cardiac Enzymes:  Recent Labs Lab 03/03/15 1016 03/03/15 1616 03/03/15 2230 03/07/15 1650 03/10/15 0115  TROPONINI 0.21* 0.30* 0.28* 0.29* 0.15*   BNP: Invalid input(s): POCBNP CBG:  Recent Labs Lab 03/09/15 1129 03/09/15 1556 03/09/15 2127 03/10/15 0053 03/10/15 0808  GLUCAP 110* 131* 127* 158* 142*   D-Dimer No results for input(s): DDIMER in the last 72 hours. Hgb A1c No results for input(s): HGBA1C in the last 72 hours. Lipid Profile No results for input(s): CHOL, HDL, LDLCALC, TRIG,  CHOLHDL, LDLDIRECT in the last 72 hours. Thyroid function studies No results for input(s): TSH, T4TOTAL, T3FREE, THYROIDAB in the last 72 hours.  Invalid input(s): FREET3 Anemia work up No results for input(s): VITAMINB12, FOLATE, FERRITIN, TIBC, IRON, RETICCTPCT in the last 72 hours. Microbiology Recent Results (from the past 240 hour(s))  Culture, blood (routine x 2)     Status: None   Collection Time: 03/02/15 11:20 AM  Result Value Ref Range Status   Specimen Description BLOOD LEFT ANTECUBITAL  Final   Special Requests BOTTLES DRAWN AEROBIC AND ANAEROBIC 5CC EACH  Final   Culture   Final    NO GROWTH 5 DAYS Performed at [facility]    Report Status 03/07/2015 FINAL  Final  Culture, blood (routine x 2)     Status: None   Collection Time: 03/02/15 11:50 AM  Result Value Ref Range Status    Specimen Description BLOOD RIGHT PORTA CATH  Final   Special Requests BOTTLES DRAWN AEROBIC AND ANAEROBIC 10CC EACH  Final   Culture   Final    NO GROWTH 5 DAYS Performed at [facility]    Report Status 03/07/2015 FINAL  Final  MRSA PCR Screening     Status: None   Collection Time: 03/02/15  4:12 PM  Result Value Ref Range Status   MRSA by PCR NEGATIVE NEGATIVE Final    Comment:        The GeneXpert MRSA Assay (FDA approved for NASAL specimens only), is one component of a comprehensive MRSA colonization surveillance program. It is not intended to diagnose MRSA infection nor to guide or monitor treatment for MRSA infections.   Respiratory virus panel     Status: None   Collection Time: 03/02/15  6:46 PM  Result Value Ref Range Status   Respiratory Syncytial Virus A Negative Negative Final   Respiratory Syncytial Virus B Negative Negative Final   Influenza A Negative Negative Final   Influenza B Negative Negative Final   Parainfluenza 1 Negative Negative Final   Parainfluenza 2 Negative Negative Final   Parainfluenza 3 Negative Negative Final   Metapneumovirus Negative Negative Final   Rhinovirus Negative Negative Final   Adenovirus Negative Negative Final    Comment: (NOTE) Performed At: [facility] [address] [place], [place] [id number] Lindon Romp MD [id number]   Urine culture     Status: None   Collection Time: 03/02/15  9:57 PM  Result Value Ref Range Status   Specimen Description URINE, CLEAN CATCH  Final   Special Requests NONE  Final   Culture   Final    40,000 COLONIES/ml ESCHERICHIA COLI Confirmed Extended Spectrum Beta-Lactamase Producer (ESBL) Performed at [facility]    Report Status 03/05/2015 FINAL  Final   Organism ID, Bacteria ESCHERICHIA COLI  Final      Susceptibility   Escherichia coli - MIC*    AMPICILLIN >=32 RESISTANT Resistant     CEFAZOLIN >=64 RESISTANT Resistant     CEFTRIAXONE >=64 RESISTANT  Resistant     CIPROFLOXACIN >=4 RESISTANT Resistant     GENTAMICIN <=1 SENSITIVE Sensitive     IMIPENEM <=0.25 SENSITIVE Sensitive     NITROFURANTOIN <=16 SENSITIVE Sensitive     TRIMETH/SULFA >=320 RESISTANT Resistant     AMPICILLIN/SULBACTAM >=32 RESISTANT Resistant     PIP/TAZO 64 INTERMEDIATE Intermediate     * 40,000 COLONIES/ml ESCHERICHIA COLI      Studies:  No results found.  Assessment: 75 y.o. [place] woman:   (  1) Status post right breast biopsy in 05/2009 for a grade 2 invasive ductal carcinoma, T2 NX M1, Stage IV, strongly estrogen and progesterone receptor-positive, HER-2-negative with an MIB-1 of 26%.  (2) Neoadjuvantly she received Letrozole and zoledronic acid beginning in June of 2011 and underwent right modified radical mastectomy February of 2012 for a ypT2 ypN2, grade 1 invasive ductal carcinoma with negative margins.   (3) She completed radiation therapy in August of 2012.   (4) She has continued on Letrozole but was switched from Zoledronic acid to Denosumab Delton See) because of concerns regarding her serum creatinine. Delton See was being given every 8 weeks.  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  (6) symptomatic anemia, with creatinine clearance < 60 cc/min; Darbepoietin Q14d started 11/03/2011; received Feraheme 01/05/2012; ferritin August 2016 was greater than 800  (7) On subcutaneous B-12 supplementation monthly.  (8) Pain from osseous metastasis, osteoarthritis, tendinopathy, and bursitis as confirmed by recent MRI.  (9) Anemia, multifactorial with renal disease, on Aranesp monthly, increased to every 2 weeks June 2016  (10) letrozole was discontinued in September 2014 with evidence of disease progression. She was started on fulvestrant injections, first given on 10/17/2012; stopped 06/03/2014 with progression  (11)  exemestane and everolimus started 07/08/2014 (a) everolimus discontinued August 2016 with pneumonitis (b) exemestane held at the start of everolimus January 2017  (12) liver biopsy 02/05/2015 confirms metastatic breast cancer to the liver, estrogen and progesterone receptor positive, HER-2 negative  (13) eribulin started 02/17/2015, to be given days 1 and 8 of each 21 day cycle, with neulasta day 9  (a) cycle 2 (scheduled 03/10/2015) held until patient back to baseline and outpatient  Plan: Brittney Cunningham's delirium and confusion is likely multifactorial and at least partiallyrelated to medications (benzodiazepines, anti-nausea), infection, and cardiac and metabolic problems. There is enough liver involvement that hepatic encephalopathy is a possibility. Note also that she was on MSContin at home for chronic pain and is not receiving that now--it may be a good idea to place a low-dose fentanyl patch . If patient's mental status does not return to baseline despite your meticulous care it may be worthwhile to obtain a brain MRI at some point but I think what we are seeing today is not suggestive of brain mets.  She is day 23 from her first cycle of eribulin. Do not believe the chemo is related to current issues and we are holding further treatments until she returns to baseline. She will be treated as outpatient.  Brittney Cunningham's husband [name] is physically disabled but I believe mentally capable and so far as I know he remains her 6. He seldom comes to office visits due to his disability. Patient agreed to a DNR last admission but that was not formalized and when I last asked her as outpatient she said she would want "everything done".  Will follow with you.  Chauncey Cruel, MD 03/10/2015  8:51 AM Medical Oncology and Hematology [facility] [address] [place], [place] [address] Tel. [phone]    Fax. [phone]

## 2015-03-10 NOTE — Care Management Note (Signed)
Case Management Note  Patient Details  Name: [name] MRN: [id number] Date of Birth: 01-Jul-1940  Subjective/Objective:  Transferred to [place], confused,agitated. D/c plan SNF-CSW following.                  Action/Plan:   Expected Discharge Date:                  Expected Discharge Plan:  Skilled Nursing Facility  In-House Referral:  NA, Clinical Social Work  Discharge planning Services  CM Consult  Post Acute Care Choice:  NA Choice offered to:  NA  DME Arranged:    DME Agency:     HH Arranged:    [place] Agency:     Status of Service:  In process, will continue to follow  Medicare Important Message Given:  Yes Date Medicare IM Given:    Medicare IM give by:    Date Additional Medicare IM Given:    Additional Medicare Important Message give by:     If discussed at [place] of Stay Meetings, dates discussed:    Additional Comments:  Dessa Phi, RN 03/10/2015, 10:43 AM  Dessa Phi RN, BSN CM  [phone]

## 2015-03-10 NOTE — Progress Notes (Signed)
Echocardiogram 2D Echocardiogram has been performed.  Brittney Cunningham 03/10/2015, 3:46 PM

## 2015-03-10 NOTE — Progress Notes (Signed)
CRITICAL VALUE ALERT  Critical value received:  Lactic acid - 3.6  Date of notification:  03/10/2015   Time of notification:  0845  Critical value read back:Yes.    Nurse who received alert:  B.Brigitte Pulse  MD notified (1st page):  MD's at bedside, notified   Time of first page:    MD notified (2nd page):  Time of second page:  Responding MD:    Time MD responded:

## 2015-03-10 NOTE — Progress Notes (Signed)
Progress Note   Brittney Cunningham [id number] DOB: 12/15/1940 DOA: 03/02/2015 PCP: [name], MD   Brief Narrative:   75 y.o. female with a PMH of stage IV right breast cancer diagnosed May/2011 with metastasis to the bone and liver status post neoadjuvant therapy, right modified radical mastectomy 03/2010 and radiation treatment, under the care of Dr. [name] who is currently treating her for progressive disease with eribulin with last dose given 02/24/15 followed by Neulasta support, who was admitted 03/02/15 with dizziness, fatigue and upper respiratory symptoms. Upon initial presentation, she was found to be tachycardic, tachypneic with a fever of 100.8. She was admitted for possible sepsis.  Major events since admission: 2/4 - CT chest confirmed RUL PNA, zithro and rocephin started, fosfomycin x1 given for ESBL  2/8 - more lethargic this AM, transfer to SDU, broaden ABX coverage, sepsis protocol re initiated, PCCM consulted   Assessment/Plan:   Principal Problem:  Sepsis ([place]), present on admission  - Source thought to be from HCAP. ESBL UTI - pt has been on vancomycin and zosyn since 1/31 and was changed to zithromax and rocephin 2/4 as pt was improving  - given one dose of fosfomycin 2/4 to target ESBL, the therapy now completed for ESBL - pt reports feeling worse today, more lethargic - transfer to SDU, hold sedating medications for now - PCCM consulted - lactic acid and procalcitonin, WBC elevated so sepsis protocol re initiated, ABX coverage broadened 2/8  Active Problems:   Irregular heart sounds - no chest pain this AM but again, pt reports feeling tired and worse this AM  - troponins have been rather flat since admission - ECHO requested for further evaluation  - keep electrolytes stable, K ~4, Mg ~2     Elevated troponin secondary to demand ischemia - in the setting of sepsis, still rather flat - no chest pain this AM - ECHO requested as noted above    Thrombocytopenia - stopped all heparin products 2/2 - SCD's for DVT prophylaxis  - Plt still slightly trending up - CBC in AM   Hypothyroidism - Continue Synthroid.   Essential hypertension - held Coreg 2/5 due to soft BP, BP better this AM but will continue to hold Coreg for now - may resume in AM   Breast cancer metastasized to bone [facility]) and liver - Under the care of Dr. [name]. Status post chemotherapy 02/24/15 followed by Neulasta support. - appreciate Dr. 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   Diabetes mellitus with complication (HCC) / hypoglycemia - Use insulin sensitive SSI for now until oral intake improves    Anemia of chronic renal failure, stage 4 (severe) (HCC) - Has required unit of blood transfusion while inpatient - no signs of active bleeding, Hg up from 7.8 --> 8.5 --> 7.8 --> 7.6 --> 8.0 --> 9.0 this AM - CBC in AM   Pain from bone metastases (HCC) - holding scheduled MS continue as pt was more lethargic 2/5 - allow analgesia as needed only    DVT  prophylaxis - SCD's  Family Communication/Anticipated D/C date and plan/Code Status   Family Communication: no family present this a.m.  Disposition Plan: SNF by 2/10, unable to d/c at this time as pt says she feels worse but unable to explain why  Code Status: Full code.  IV Access:    Peripheral IV  Procedures and diagnostic studies:    Dg Chest 2 View 03/02/2015 Chronic RIGHT basilar atelectasis. No acute pulmonary abnormalities. Diffuse osseous metastases.  Ct Chest Wo Contrast  03/06/2015   Posterior RUL PNA, bilateral pleural effusions with bilateral lower lobe atelectasis/consolidation, multiple lung nodules, 5 mm LLL nodule unchanged.  Medical Consultants:    None.  Anti-Infectives:    Vancomycin 03/02/15---> 2/3  Zosyn 03/02/15---> 2/3  Primaxin 2/3 --> change to fosfomycin x 1 dose  Zithromax and Rocephin 2/4 -->   Subjective:   Reports feeling worse this AM, tired.   Objective:    Filed Vitals:   03/09/15 2238 03/09/15 2331 03/10/15 0346 03/10/15 0506  BP: 127/82 139/77 132/85 131/70  Pulse: 136 143 132 136  Temp: 97.2 F (36.2 C)   97.3 F (36.3 C)  TempSrc: Oral   Oral  Resp: 18 18 24 22   Height:      Weight:    83.7 kg (184 lb 8.4 oz)  SpO2: 93% 97% 98% 96%    Intake/Output Summary (Last 24 hours) at 03/10/15 0642 Last data filed at 03/09/15 2051  Gross per 24 hour  Intake    590 ml  Output      0 ml  Net    590 ml   Filed Weights   03/08/15 0437 03/09/15 0433 03/10/15 0506  Weight: 83.1 kg (183 lb 3.2 oz) 82.1 kg (181 lb) 83.7 kg (184 lb 8.4 oz)    Exam: Gen:  Lethargic, difficult to stay alert  Cardiovascular:  Sinus rhythm, S1/S2 Respiratory:  Lungs with diminished breath sounds at bases, poor inspiratory effor  Gastrointestinal:  Abdomen soft, NT/ND, + BS Extremities:  Trace edema in LE   Data Reviewed:    Labs: Basic Metabolic Panel:  Recent Labs Lab 03/06/15 1250 03/06/15 1959 03/07/15 0405 03/08/15 0550 03/09/15 0504 03/10/15 0145  NA  --  137 135 138 139 139  K  --  5.1 4.6 4.4 4.4 5.1  CL  --  107 106 107 109 108  CO2  --  20* 19* 22 22 17*  GLUCOSE  --  127* 125* 95 106* 166*  BUN  --  38* 37* 37* 31* 25*  CREATININE  --  1.68* 1.65* 1.43* 1.07* 1.02*  CALCIUM  --  7.3* 7.0* 7.3* 7.7* 7.9*  MG 1.8 1.8 1.8  --  1.7 1.6*    CBC:  Recent Labs Lab 03/06/15 0500 03/07/15 0405 03/08/15 0550 03/09/15 0504 03/10/15 0145  WBC 8.7 7.0 6.7 8.0 15.5*  HGB 8.5* 7.8* 7.6* 8.0* 9.0*  HCT 27.6* 25.3* 25.1*  26.2* 28.5*  MCV 94.2 92.7 95.1 93.6 90.8  PLT 114* 97* 94* 103* 124*   Sepsis Labs:  Recent Labs Lab 03/05/15 0515  03/06/15 1955 03/07/15 0405 03/08/15 0550 03/09/15 0504 03/10/15 0145  WBC 7.2  < >  --  7.0 6.7 8.0 15.5*  LATICACIDVEN 2.2*  --  2.3*  --   --   --   --   < > = values in this interval not displayed. Microbiology Recent Results (from the past 240 hour(s))  Culture, blood (routine x 2)  Status: None   Collection Time: 03/02/15 11:20 AM  Result Value Ref Range Status   Specimen Description BLOOD LEFT ANTECUBITAL  Final   Special Requests BOTTLES DRAWN AEROBIC AND ANAEROBIC 5CC EACH  Final   Culture   Final    NO GROWTH 5 DAYS Performed at [facility]    Report Status 03/07/2015 FINAL  Final  Culture, blood (routine x 2)     Status: None   Collection Time: 03/02/15 11:50 AM  Result Value Ref Range Status   Specimen Description BLOOD RIGHT PORTA CATH  Final   Special Requests BOTTLES DRAWN AEROBIC AND ANAEROBIC 10CC EACH  Final   Culture   Final    NO GROWTH 5 DAYS Performed at [facility]    Report Status 03/07/2015 FINAL  Final  MRSA PCR Screening     Status: None   Collection Time: 03/02/15  4:12 PM  Result Value Ref Range Status   MRSA by PCR NEGATIVE NEGATIVE Final    Comment:        The GeneXpert MRSA Assay (FDA approved for NASAL specimens only), is one component of a comprehensive MRSA colonization surveillance program. It is not intended to diagnose MRSA infection nor to guide or monitor treatment for MRSA infections.   Respiratory virus panel     Status: None   Collection Time: 03/02/15  6:46 PM  Result Value Ref Range Status   Respiratory Syncytial Virus A Negative Negative Final   Respiratory Syncytial Virus B Negative Negative Final   Influenza A Negative Negative Final   Influenza B Negative Negative Final   Parainfluenza 1 Negative Negative Final   Parainfluenza 2 Negative Negative Final   Parainfluenza 3  Negative Negative Final   Metapneumovirus Negative Negative Final   Rhinovirus Negative Negative Final   Adenovirus Negative Negative Final    Comment: (NOTE) Performed At: [facility] [place], [place] [id number] Lindon Romp MD [id number]   Urine culture     Status: None   Collection Time: 03/02/15  9:57 PM  Result Value Ref Range Status   Specimen Description URINE, CLEAN CATCH  Final   Special Requests NONE  Final   Culture   Final    40,000 COLONIES/ml ESCHERICHIA COLI Confirmed Extended Spectrum Beta-Lactamase Producer (ESBL) Performed at [facility]    Report Status 03/05/2015 FINAL  Final   Organism ID, Bacteria ESCHERICHIA COLI  Final      Susceptibility   Escherichia coli - MIC*    AMPICILLIN >=32 RESISTANT Resistant     CEFAZOLIN >=64 RESISTANT Resistant     CEFTRIAXONE >=64 RESISTANT Resistant     CIPROFLOXACIN >=4 RESISTANT Resistant     GENTAMICIN <=1 SENSITIVE Sensitive     IMIPENEM <=0.25 SENSITIVE Sensitive     NITROFURANTOIN <=16 SENSITIVE Sensitive     TRIMETH/SULFA >=320 RESISTANT Resistant     AMPICILLIN/SULBACTAM >=32 RESISTANT Resistant     PIP/TAZO 64 INTERMEDIATE Intermediate     * 40,000 COLONIES/ml ESCHERICHIA COLI     Medications:   . aspirin  81 mg Oral Daily  . atorvastatin  80 mg Oral Daily  . azithromycin  500 mg Oral QHS  . calcium carbonate  1 tablet Oral TID WC  . cefTRIAXone (ROCEPHIN)  IV  1 g Intravenous Q24H  . clopidogrel  75 mg Oral Daily  . guaiFENesin  600 mg Oral BID  . insulin aspart  0-5 Units Subcutaneous QHS  . insulin aspart  0-9 Units Subcutaneous TID WC  . ipratropium-albuterol  3 mL Nebulization QID  . levothyroxine  150 mcg Oral QAC breakfast  . magnesium sulfate 1 - 4 g bolus IVPB  2 g Intravenous Once  . mirtazapine  15 mg Oral QHS  . pantoprazole  40 mg Oral Daily  . sodium chloride flush  10-40 mL Intracatheter Q12H   Continuous Infusions:    Time spent: 35 minutes.   The patient is medically complex with multiple co-morbidities and is at high risk for clinical deterioration and requires high complexity decision making.   LOS: 8 days   Faye Ramsay  [facility] Pager [phone]. If unable to reach me by pager, please call my cell phone at [phone]  Please refer to [email or web address], password [email or web address] to get updated schedule on who will round on this patient, as hospitalists switch teams weekly. If 7PM-7AM, please contact night-coverage at [email or web address], password [email or web address] for any overnight needs.  03/10/2015, 6:42 AM

## 2015-03-10 NOTE — Progress Notes (Signed)
Rapid response RN called to assess patient as she continued to be very restless, HR remained increased, and there was a change in mentation.  Patient became less responsive and would hardly articulate any verbal words or responses.  She continued with agitation and restlessness but would not verbally respond to staff.  Patient had had IV ativan and IV phenergan prior to this.  After rapid RN performed assessment and spoke with NP on call patient received 5 mg of Lopressor IV, placed back on telemetry, and labs drawn to cycle troponins. Pt also received a 500 cc NS bolus.  Will continue to monitor patient.

## 2015-03-10 NOTE — Progress Notes (Signed)
Report called to melinda in step down [employer]. Brigitte Pulse, RN

## 2015-03-10 NOTE — Progress Notes (Addendum)
CRITICAL VALUE ALERT  Critical value received:  Lactic acid 2.8  Date of notification:  03/10/15  Time of notification:  [id number]  Critical value read back: yes  Nurse who received alert:  Gae Gallop  MD notified (1st page):  Doyle Askew  Time of first page:  1845  MD notified (2nd page):  Time of second page:  Responding MD:  Doyle Askew  Time MD responded:  [phone]

## 2015-03-10 NOTE — Progress Notes (Signed)
Pharmacy Antibiotic Note  Brittney Cunningham is a 75 y.o. female admitted on 03/02/2015 with pneumonia and UTI.  She has been on antibiotics since admission for PNA and ESBL Ecoli UTI.  Most recently she was on Rocephin & Zithromax, however experienced tachycardia and altered mental status overnight.  Due to acute changes she is being transferred to stepdown and antibiotic coverage is being broadened.  Pharmacy has been consulted for Primaxin & Vancomycin dosing.  Plan: Vancomycin 750 IV every 12 hours.  Goal trough 15-20 mcg/mL. Primaxin 500mg  IV q8h  Check Vancomycin trough at steady state Monitor renal function and cx data    Height: 5\' 3"  (160 cm) Weight: 184 lb 8.4 oz (83.7 kg) IBW/kg (Calculated) : 52.4  Temp (24hrs), Avg:97.6 F (36.4 C), Min:96.5 F (35.8 C), Max:99.1 F (37.3 C)   Recent Labs Lab 03/05/15 0515  03/06/15 0500 03/06/15 1955 03/06/15 1959 03/07/15 0405 03/08/15 0550 03/09/15 0504 03/10/15 0145 03/10/15 0755  WBC 7.2  --  8.7  --   --  7.0 6.7 8.0 15.5*  --   CREATININE 1.60*  < > 1.70*  --  1.68* 1.65* 1.43* 1.07* 1.02*  --   LATICACIDVEN 2.2*  --   --  2.3*  --   --   --   --   --  3.6*  < > = values in this interval not displayed.  Estimated Creatinine Clearance: 51.1 mL/min (by C-G formula based on Cr of 1.02).    Allergies  Allergen Reactions  . Chlorhexidine Hives, Itching and Rash    This was most likely a CONTACT DERMATITIS versus true systemic allergic reaction  . Adhesive [Tape] Hives  . Codeine Swelling    Antimicrobials this admission: Vanc 1/31>> 2/3  Zosyn 1/31>> 2/3; 2/8>> Primaxin 2/3 >> 2/4; 2/8>> 2/4 Fosfomycin 3g x 1 Rocephin 2/4>>2/8 Zithromax 2/4>>2/8  Dose adjustments this admission:  Microbiology results: /31BCx: NG 1/31 UCx: 40k E.coli (+) ESBL -sens Primaxin 1/31 Resp virus panel: neg  1/31 MRSA PCR: negative  1/31 Influenza PCR: negative  Thank you for allowing pharmacy to be a part of this patient's  care.  Netta Cedars, PharmD, BCPS Pager: [phone] 03/10/2015 11:07 AM

## 2015-03-10 NOTE — Consult Note (Signed)
PULMONARY / CRITICAL CARE MEDICINE   Name: Brittney Cunningham MRN: [id number] DOB: [birth year]    ADMISSION DATE:  03/02/2015 CONSULTATION DATE:  03/10/15  REFERRING MD:  Dr. Doyle Askew / [facility]   CHIEF COMPLAINT:  AMS  HISTORY OF PRESENT ILLNESS:   Brittney Cunningham is a 75 y/o female with PMH of HTN, hypothyroidism, CAD, OSA (? If uses CPAP),CKD and stage IV right breast cancer (dx 05/2009, s/p mastectomy) with mets to the bone and liver s/p neoadjuvant chemotherapy and radiation seen by Brittney Cunningham currently on regimen of eribulin (last dose 02/24/15) and Neulasta who presented to [facility] on 1/31 with URI symptoms and worsening fatigue.   Upon arrival to the ED she was found to be tachycardic and tachypneic with a fever of 100.8, no leukocytosis however she was recently neutropenic secondary to Neulasta. Initial labwork revealed Acute on CKD with creatinine 1.48 (baseline sr cr ~ 1.2), NA 132, K 4.0, hemoglobin 8.9, WBC 6. Initial CXR with atelectasis but no definite infiltrate. Patient was admitted for sepsis likely due to HCAP and was started on empiric antibiotics at that time.   Subsequently, urine cultures came back concerning for ESBL UTI with 40k colonies. Chest CT on 2/4 also revealed worsening RUL infiltrate compatible with pneumonia as well as small bilateral pleural effusions. Current antibiotic regimen includes Azithromycin and Rocephin since 2/4 (changed from Vanc and Zosyn on admission).   Overnight on 2/8 rapid response was called to patient's bedside due to patient becoming increasingly agitated, tachycardic into the 140s and decreased level of consciousness. She was given IV phenergan for nausea prior to this and IV ativan during this episode. Patient received 5 mg of Lopressor and 500 cc NS bolus at that time with some improvement in tachycardia. Patient remained lethargic and confused in AM 2/8, order placed to transfer to SDU and PCCM was consulted.    PAST MEDICAL HISTORY :  She   has a past medical history of Anemia; Anxiety and depression; GERD (gastroesophageal reflux disease); Hyperlipidemia; Hypothyroidism; Osteoarthritis; Carotid artery stenosis; CAD (coronary artery disease); OSA (obstructive sleep apnea); Hypertension; Carotid artery occlusion; Unspecified constipation (03/15/2012); Bursitis; Collagen vascular disease ([place]); History of radiation therapy (10/31/12-11/13/12); Valvular heart disease (07/11/2013); Diarrhea (09/28/2013); Chronic renal insufficiency, stage II (mild); History of blood transfusion; Urinary tract bacterial infections; Breast cancer ([place]) (05/2009); Diabetes mellitus type II; Hypocalcemia (07/2014); and Drug-induced pneumonitis (08/2014).  PAST SURGICAL HISTORY: She  has past surgical history that includes Incisional breast biopsy; Cesarean section; Other surgical history; Knee surgery; Carotid endarterectomy (2011); Port-a-cath removal; Modified radical mastectomy (Feb 2012); Carotid stent; Cardiac catheterization (06/2013); carotid angiogram (N/A, 03/01/2011); arch aortogram (03/01/2011); carotid stent insertion (Left, 03/15/2011); left heart catheterization with coronary angiogram (N/A, 07/07/2013); carotid dopplers (05/2014); and transthoracic echocardiogram (06/2013).  Allergies  Allergen Reactions  . Chlorhexidine Hives, Itching and Rash    This was most likely a CONTACT DERMATITIS versus true systemic allergic reaction  . Adhesive [Tape] Hives  . Codeine Swelling    No current facility-administered medications on file prior to encounter.   Current Outpatient Prescriptions on File Prior to Encounter  Medication Sig  . aspirin 81 MG chewable tablet Chew 81 mg by mouth daily.  Marland Kitchen atorvastatin (LIPITOR) 80 MG tablet Take 1 tablet (80 mg total) by mouth daily.  . calcium carbonate (TUMS) 500 MG chewable tablet Chew 1 tablet (200 mg of elemental calcium total) by mouth 3 (three) times daily with meals. (Patient taking differently: Chew 1 tablet by mouth  3  (three) times daily as needed for indigestion or heartburn. )  . carvedilol (COREG) 25 MG tablet Take 1 tablet (25 mg total) by mouth 2 (two) times daily with a meal. (Patient taking differently: Take 25 mg by mouth 2 (two) times daily with a meal. Pt taking 1/2 of carvediol tablet twice daily.)  . clopidogrel (PLAVIX) 75 MG tablet Take 1 tablet (75 mg total) by mouth daily.  . cyanocobalamin (,VITAMIN B-12,) 1000 MCG/ML injection Inject 1,000 mcg into the muscle every 30 (thirty) days.   Marland Kitchen gabapentin (NEURONTIN) 100 MG capsule Take 1 capsule (100 mg total) by mouth 3 (three) times daily.  Marland Kitchen glycerin adult (GLYCERIN ADULT) 2 G SUPP Place 1 suppository rectally once as needed (constipation).  . insulin detemir (LEVEMIR) 100 UNIT/ML injection Inject 0.15 mLs (15 Units total) into the skin at bedtime.  . insulin lispro (HUMALOG KWIKPEN) 100 UNIT/ML KiwkPen 2 U SQ qAM, 3 U SQ qLUNCH, and 3 U SQ qSupper  . levothyroxine (SYNTHROID, LEVOTHROID) 150 MCG tablet 1 tab po qd except take 1 and 1/2 tabs on Mondays and Fridays  . mirtazapine (REMERON) 15 MG tablet Take 1 tablet (15 mg total) by mouth at bedtime.  Marland Kitchen morphine (MS CONTIN) 30 MG 12 hr tablet Take 1 tablet (30 mg total) by mouth 3 (three) times daily.  Marland Kitchen morphine (MSIR) 15 MG tablet Take 1 tablet (15 mg total) by mouth every 4 (four) hours as needed for severe pain.  . potassium chloride SA (K-DUR,KLOR-CON) 20 MEQ tablet Take 2 tablets (40 mEq total) by mouth daily.  Marland Kitchen senna-docusate (SENOKOT-S) 8.6-50 MG per tablet Take 1 tablet by mouth at bedtime.   Marland Kitchen ACCU-CHEK AVIVA PLUS test strip CHECK BLOOD SUGAR TWICE A DAY FOR DX:250.00  . Blood Glucose Monitoring Suppl (ACCU-CHEK NANO SMARTVIEW) W/DEVICE KIT Use to check blood sugar twice a day. DX 250.00  . darbepoetin (ARANESP) 200 MCG/0.4ML SOLN Inject 300 mcg into the skin every 14 (fourteen) days.  Marland Kitchen lidocaine-prilocaine (EMLA) cream Apply to affected area once  . nitroGLYCERIN (NITROSTAT) 0.4 MG SL  tablet Place 1 tablet (0.4 mg total) under the tongue every 5 (five) minutes as needed for chest pain.  . polyethylene glycol (MIRALAX / GLYCOLAX) packet Take 17 g by mouth daily as needed for mild constipation. Reported on 02/17/2015  . [DISCONTINUED] simvastatin (ZOCOR) 40 MG tablet Take 40 mg by mouth every evening.    FAMILY HISTORY:  Her indicated that her mother is deceased. She indicated that her father is deceased.   SOCIAL HISTORY: She  reports that she quit smoking about 27 years ago. Her smoking use included Cigarettes. She has a 20 pack-year smoking history. She has never used smokeless tobacco. She reports that she does not drink alcohol or use illicit drugs.  REVIEW OF SYSTEMS:   Unable to complete with patient due to AMS   SUBJECTIVE:  RN reports ongoing intermittent lethargy, was able to ambulate with assistance to the bedside commode.  Once back to bed, lethargic and difficult to arouse again.   VITAL SIGNS: BP 131/70 mmHg  Pulse 136  Temp(Src) 97.3 F (36.3 C) (Oral)  Resp 22  Ht _0  (1.6 m)  Wt 184 lb 8.4 oz (83.7 kg)  BMI 32.70 kg/m2  SpO2 98%  LMP 03/13/2012  HEMODYNAMICS:    VENTILATOR SETTINGS:    INTAKE / OUTPUT: I/O last 3 completed shifts: In: 890 [P.O.:380; I.V.:160; IV Piggyback:350] Out: -   PHYSICAL EXAMINATION: General: Chronically  ill appearing female, lying in bed, NAD  Neuro: Somnolent, arouses to voice, confused, not oriented to time or place but re-orients with some prompting.  HEENT: Mucous membranes dry.  Cardiovascular: RRR, no murmurs.  Lungs: Shallow, non-labored.  Good air movement. Crackles on the right.  Abdomen: Some distention, soft, non-tender.  Musculoskeletal: No gross deformities. No pedal edema.  Skin: No rashes.   LABS:  BMET  Recent Labs Lab 03/08/15 0550 03/09/15 0504 03/10/15 0145  NA 138 139 139  K 4.4 4.4 5.1  CL 107 109 108  CO2 22 22 17*  BUN 37* 31* 25*  CREATININE 1.43* 1.07*  1.02*  GLUCOSE 95 106* 166*    Electrolytes  Recent Labs Lab 03/07/15 0405 03/08/15 0550 03/09/15 0504 03/10/15 0145  CALCIUM 7.0* 7.3* 7.7* 7.9*  MG 1.8  --  1.7 1.6*    CBC  Recent Labs Lab 03/08/15 0550 03/09/15 0504 03/10/15 0145  WBC 6.7 8.0 15.5*  HGB 7.6* 8.0* 9.0*  HCT 25.1* 26.2* 28.5*  PLT 94* 103* 124*    Coag's No results for input(s): APTT, INR in the last 168 hours.  Sepsis Markers  Recent Labs Lab 03/05/15 0515 03/06/15 1955 03/10/15 0755  LATICACIDVEN 2.2* 2.3* 3.6*  PROCALCITON  --   --  0.39    ABG No results for input(s): PHART, PCO2ART, PO2ART in the last 168 hours.  Liver Enzymes No results for input(s): AST, ALT, ALKPHOS, BILITOT, ALBUMIN in the last 168 hours.  Cardiac Enzymes  Recent Labs Lab 03/07/15 1650 03/10/15 0115 03/10/15 0755  TROPONINI 0.29* 0.15* 0.16*    Glucose  Recent Labs Lab 03/09/15 0740 03/09/15 1129 03/09/15 1556 03/09/15 2127 03/10/15 0053 03/10/15 0808  GLUCAP 109* 110* 131* 127* 158* 142*    Imaging No results found.   STUDIES:  CT chest 2/4 >> Posterior RUL opacity compatible with PNA. Small bilateral pleural effusion. Multiple previously described lung nodules obscured by acute changes.   CULTURES: Urine 1/31 >> ESBL E. Coli 40,000 colonies  Blood 1/31 >> Negative   ANTIBIOTICS: Vancomycin 1/31 >> 2/4  Zosyn 1/31 >> 2/4  Primaxin 2/3 >> 2/4  Azithromycin 2/4 >>  Rocephin 2/4 >>   SIGNIFICANT EVENTS: 1/31 >> Admitted for sepsis likely due to HCAP  2/08 >> Rapid response initiated for AMS, lethargy, transferred to SDU, PCCM consulted   LINES/TUBES: PIV   DISCUSSION: Ms. Tercero is a 75 y/o female with complicated PMH including Stage IV right breast cancer currently on Eribulin/Neulasta with Brittney Cunningham who presented 1/31 with sepsis like secondary to HCAP. Subsequently found to have RUL infiltrate and ESBL UTI, currently on zithromax and rocephin. Rapid response  initiated 2/8 for AMS, lethargy and tachycardia. Multiple potential etiologies including delirium, med side effects (patient received phenergan and Ativan overnight), hypercarbia/hypoxia, & ? Possible metastasis to the brain.   ASSESSMENT / PLAN:  PULMONARY A: RUL Infiltrate - likely HCAP, ? Aspiration component   OSA - ? If sleep study ever performed, unclear from prior notes if on CPAP At risk for aspiration in the setting of AMS/lethargy Pulmonary nodules - ? Metastatic disease  Bilateral Pleural Effusions  P:  O2 PRN to maintain sats > 92% ABG now  CXR now  Need to discuss code status with family - low threshold for intubation for airway protection in the setting of AMS/lethargy  Pulmonary hygiene - encourage coughing, IS  Intermittent CXR  Continue scheduled duonebs  See ID  CARDIOVASCULAR A:  Tachycardia - ? If  due to sepsis  HTN - BP stable since admission  Hyperlipidemia  CAD - no acute chest pain, ECG with no ST changes  Elevated troponin - minimally elevated, likely demand ischemia  P:  Telemetry monitoring  Assess ECHO with bilateral pleural effusion, tachy and hx of valvular disease Continue Lipitor, resume in am 2/9 Trend troponin > 0.16 2/8   RENAL A:  CKD - initially presented with minimal AKI, improved, creatinine currently at baseline  Lactic Acidosis - unclear if related to liver metastases vs sepsis  Hypomagnesemia  P:  Trend BMP/UOP  Trend lactate  Replete mag  Replace electrolytes as indicated    GASTROINTESTINAL A:  GERD P:  NPO due to mental status  Change Protonix to IV    HEMATOLOGIC A:  Stage IV Breast Cancer with mets to bone and liver - Brittney Cunningham following along while inpatient, holding further chemo until clinical status improves  Leukocytosis - up to 15.5 from 8.0 on 2/7 Normocytic Anemia - likely anemia of chronic disease  Thrombocytopenia - trending upwards  DVT Prophylaxis  P:   Monitor CBC  Monitor fever curve  Continue abx see ID  Transfuse per ICU protocol as indicated  SCDs  INFECTIOUS A:  RUL infiltrate likely HCAP  UTI - ESBL P:  Trend CBC  Change abx to Primaxin and Vancomycin for HCAP/UTI coverage    ENDOCRINE A:  Hypothyroidism  Type II DM  At risk for hypoglycemia if remains NPO  P:  Assess TSH  Continue Synthroid  SSI   NEUROLOGIC A:  AMS/Lethargy - unclear etiology at this point, ? If due to meds vs hypercarbia vs brain mets  Anxiety/Depression P:  RASS goal: N/A ABG pending  Consider head MRI head if mental status does not improve, ? Mets to brain. Possible some of the change seen early am 2/8 was seizure.  Avoid sedating medications - reduce MSIR, remeron   FAMILY  - Updates: No family present at bedside.  Will need to clarify goals of care.    - Inter-disciplinary family meet or Palliative Care meeting due by: 03/17/2015    Noe Gens, NP-C [facility] Pgr: [phone] or if no answer [phone] 03/10/2015, 10:02 AM

## 2015-03-10 NOTE — Progress Notes (Signed)
Transfer to SDU, lethargy. Order placed. Brittney Cunningham

## 2015-03-11 ENCOUNTER — Encounter (HOSPITAL_COMMUNITY): Payer: Self-pay | Admitting: Nurse Practitioner

## 2015-03-11 DIAGNOSIS — I255 Ischemic cardiomyopathy: Secondary | ICD-10-CM | POA: Clinically undetermined

## 2015-03-11 DIAGNOSIS — R7989 Other specified abnormal findings of blood chemistry: Secondary | ICD-10-CM

## 2015-03-11 DIAGNOSIS — R778 Other specified abnormalities of plasma proteins: Secondary | ICD-10-CM

## 2015-03-11 LAB — GLUCOSE, CAPILLARY
GLUCOSE-CAPILLARY: 146 mg/dL — AB (ref 65–99)
GLUCOSE-CAPILLARY: 134 mg/dL — AB (ref 65–99)
Glucose-Capillary: 137 mg/dL — ABNORMAL HIGH (ref 65–99)
Glucose-Capillary: 118 mg/dL — ABNORMAL HIGH (ref 65–99)
Glucose-Capillary: 126 mg/dL — ABNORMAL HIGH (ref 65–99)

## 2015-03-11 LAB — COMPREHENSIVE METABOLIC PANEL
ALK PHOS: 147 U/L — AB (ref 38–126)
ALT: 26 U/L (ref 14–54)
AST: 89 U/L — AB (ref 15–41)
Albumin: 2.1 g/dL — ABNORMAL LOW (ref 3.5–5.0)
Anion gap: 15 (ref 5–15)
BUN: 30 mg/dL — AB (ref 6–20)
CALCIUM: 7.5 mg/dL — AB (ref 8.9–10.3)
CHLORIDE: 112 mmol/L — AB (ref 101–111)
CO2: 15 mmol/L — AB (ref 22–32)
CREATININE: 1.14 mg/dL — AB (ref 0.44–1.00)
GFR calc Af Amer: 54 mL/min — ABNORMAL LOW (ref >60)
GFR calc non Af Amer: 47 mL/min — ABNORMAL LOW (ref >60)
GLUCOSE: 161 mg/dL — AB (ref 65–99)
Potassium: 4.9 mmol/L (ref 3.5–5.1)
SODIUM: 142 mmol/L (ref 135–145)
Total Bilirubin: 1.1 mg/dL (ref 0.3–1.2)
Total Protein: 5.1 g/dL — ABNORMAL LOW (ref 6.5–8.1)

## 2015-03-11 LAB — CBC
HCT: 32.7 % — ABNORMAL LOW (ref 36.0–46.0)
HEMOGLOBIN: 9.9 g/dL — AB (ref 12.0–15.0)
MCH: 28.4 pg (ref 26.0–34.0)
MCHC: 30.3 g/dL (ref 30.0–36.0)
MCV: 93.7 fL (ref 78.0–100.0)
PLATELETS: 135 10*3/uL — AB (ref 150–400)
RBC: 3.49 MIL/uL — AB (ref 3.87–5.11)
RDW: 16.7 % — ABNORMAL HIGH (ref 11.5–15.5)
WBC: 16.9 10*3/uL — AB (ref 4.0–10.5)

## 2015-03-11 LAB — MAGNESIUM: Magnesium: 2 mg/dL (ref 1.7–2.4)

## 2015-03-11 MED ORDER — FUROSEMIDE 10 MG/ML IJ SOLN
40.0000 mg | Freq: Two times a day (BID) | INTRAMUSCULAR | Status: DC
  Administered 2015-03-11 – 2015-03-13 (×5): 40 mg via INTRAVENOUS
  Filled 2015-03-11 (×5): qty 4

## 2015-03-11 NOTE — Progress Notes (Signed)
Date: March 11, 2015 Chart reviewed for concurrent status and case management needs. Will continue to follow patient for changes and needs:  pna o2 needs and iv fld support Velva Harman, BSN, [place], [place]   [phone]

## 2015-03-11 NOTE — Progress Notes (Addendum)
Progress Note   Brittney Cunningham [id number] DOB: [birth year] DOA: 03/02/2015 PCP: [name], MD   Brief Narrative:   75 y.o. female with a PMH of stage IV right breast cancer diagnosed May/2011 with metastasis to the bone and liver status post neoadjuvant therapy, right modified radical mastectomy 03/2010 and radiation treatment, under the care of Dr. Jana Cunningham who is currently treating her for progressive disease with eribulin with last dose given 02/24/15 followed by Neulasta support, who was admitted 03/02/15 with dizziness, fatigue and upper respiratory symptoms. Upon initial presentation, she was found to be tachycardic, tachypneic with a fever of 100.8. She was admitted for possible sepsis.  Major events since admission: 2/4 - CT chest confirmed RUL PNA, zithro and rocephin started, fosfomycin x1 given for ESBL  2/8 - more lethargic this AM, transfer to SDU, broaden ABX coverage, sepsis protocol re initiated, PCCM consulted  2/9 - cardio consulted due to persistently elevated trop and EF of 35% on ECHO   Assessment/Plan:   Principal Problem:  Sepsis ([place]), present on admission  - Source thought to be from HCAP. ESBL UTI - pt has been on vancomycin and zosyn since 1/31 and was changed to zithromax and rocephin 2/4 as pt was improving  - given one dose of fosfomycin 2/4 to target ESBL, the therapy now completed for ESBL - transfer to SDU again 2/8, hold sedating medications for now - PCCM consulted - lactic acid and procalcitonin, WBC elevated so sepsis protocol re initiated, ABX coverage broadened 2/8 - clinically improving, more alert this AM   Active Problems:   Irregular heart sounds - no chest pain this AM but again, pt reports feeling tired and worse this AM  - troponins have been rather flat since admission - ECHO with EF 35%, cardiology consulted  - keep electrolytes stable, K ~4, Mg ~2     Elevated troponin secondary to demand ischemia - in the setting of  sepsis, still rather flat - no chest pain this AM - cardio consulted     Thrombocytopenia - stopped all heparin products 2/2 - SCD's for DVT prophylaxis  - Plt still slightly trending up - CBC in AM    Anasarca - diffuse volume overload  - in the setting of hypoalbuminemia and volume resuscitation in the setting of sepsis and hypotension  - lasix started per cardiology    Hypothyroidism - Continue Synthroid.   Essential hypertension - held Coreg 2/5 due to soft BP, BP better this AM but will continue to hold Coreg for now - may resume in AM   Breast cancer metastasized to bone [facility]) and liver - Under the care of Dr. Jana Cunningham. Status post chemotherapy 02/24/15 followed by Neulasta support. - appreciate Dr. 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   Diabetes mellitus with complication (HCC) / hypoglycemia - Use insulin sensitive SSI for now until oral intake improves    Anemia of chronic renal failure, stage 4 (severe) (HCC) - Has required unit of blood transfusion while inpatient -  no signs of active bleeding, Hg up from 7.8 --> 8.5 --> 7.8 --> 7.6 --> 8.0 --> 9.0 --> 9.9 this AM - CBC in AM   Pain from bone metastases (HCC) - holding scheduled MS continue as pt was more lethargic 2/5 - allow analgesia as needed only    DVT prophylaxis - SCD's  Family Communication/Anticipated D/C date and plan/Code Status   Family Communication: no family present this a.m.  Disposition Plan: SNF by 2/12, unable to d/c at this time  Code Status: Full code.  IV Access:     Peripheral IV  Procedures and diagnostic studies:    Dg Chest 2 View 03/02/2015 Chronic RIGHT basilar atelectasis. No acute pulmonary abnormalities. Diffuse osseous metastases.  Ct Chest Wo Contrast  03/06/2015  Posterior RUL PNA, bilateral pleural effusions with bilateral lower lobe atelectasis/consolidation, multiple lung nodules, 5 mm LLL nodule unchanged.  Medical Consultants:    None.  Anti-Infectives:    Vancomycin 03/02/15---> 2/3  Zosyn 03/02/15---> 2/3  Primaxin 2/3 --> change to fosfomycin x 1 dose  Zithromax and Rocephin 2/4 --> 2/8  Vancomycin and Imipenem 2/8 -->  Subjective:   Reports feeling better this AM>   Objective:    Filed Vitals:   03/11/15 1700 03/11/15 1800 03/11/15 1900 03/11/15 2000  BP: 123/34 122/71 130/37 125/44  Pulse: 80 86 109 74  Temp:      TempSrc:      Resp: 25 30 27 17   Height:      Weight:      SpO2: 100% 99% 100% 100%    Intake/Output Summary (Last 24 hours) at 03/11/15 2121 Last data filed at 03/11/15 2000  Gross per 24 hour  Intake    730 ml  Output      0 ml  Net    730 ml   Filed Weights   03/09/15 0433 03/10/15 0506 03/11/15 0400  Weight: 82.1 kg (181 lb) 83.7 kg (184 lb 8.4 oz) 80.8 kg (178 lb 2.1 oz)    Exam: Gen:  More alert this AM Cardiovascular:  Sinus rhythm, S1/S2 Respiratory:  Lungs with diminished breath sounds at bases, poor inspiratory effor  Gastrointestinal:  Abdomen soft, NT/ND, + BS Extremities:  Upper and lower extremity bilateral, edema   Data Reviewed:    Labs: Basic Metabolic Panel:  Recent Labs Lab 03/06/15 1959 03/07/15 0405 03/08/15 0550 03/09/15 0504 03/10/15 0145 03/11/15 0530  NA 137 135 138 139 139 142  K 5.1 4.6 4.4 4.4 5.1 4.9  CL 107 106 107 109 108 112*  CO2 20* 19* 22 22 17* 15*  GLUCOSE 127* 125* 95 106* 166* 161*  BUN 38* 37* 37* 31* 25* 30*  CREATININE 1.68* 1.65* 1.43* 1.07* 1.02* 1.14*  CALCIUM 7.3* 7.0* 7.3* 7.7* 7.9* 7.5*  MG 1.8 1.8  --  1.7 1.6*  2.0    CBC:  Recent Labs Lab 03/07/15 0405 03/08/15 0550 03/09/15 0504 03/10/15 0145 03/11/15 0530  WBC 7.0 6.7 8.0 15.5* 16.9*  HGB 7.8* 7.6* 8.0* 9.0* 9.9*  HCT 25.3* 25.1* 26.2* 28.5* 32.7*  MCV 92.7 95.1 93.6 90.8 93.7  PLT 97* 94* 103* 124* 135*   Sepsis Labs:  Recent Labs Lab 03/06/15 1955  03/08/15 0550 03/09/15 0504 03/10/15 0145 03/10/15 0755 03/10/15 1410 03/10/15 1800 03/11/15 0530  PROCALCITON  --   --   --   --   --  0.39  --   --   --   WBC  --   < >  6.7 8.0 15.5*  --   --   --  16.9*  LATICACIDVEN 2.3*  --   --   --   --  3.6* 3.3* 2.8*  --   < > = values in this interval not displayed. Microbiology Recent Results (from the past 240 hour(s))  Culture, blood (routine x 2)     Status: None   Collection Time: 03/02/15 11:20 AM  Result Value Ref Range Status   Specimen Description BLOOD LEFT ANTECUBITAL  Final   Special Requests BOTTLES DRAWN AEROBIC AND ANAEROBIC 5CC EACH  Final   Culture   Final    NO GROWTH 5 DAYS Performed at [facility]    Report Status 03/07/2015 FINAL  Final  Culture, blood (routine x 2)     Status: None   Collection Time: 03/02/15 11:50 AM  Result Value Ref Range Status   Specimen Description BLOOD RIGHT PORTA CATH  Final   Special Requests BOTTLES DRAWN AEROBIC AND ANAEROBIC 10CC EACH  Final   Culture   Final    NO GROWTH 5 DAYS Performed at [facility]    Report Status 03/07/2015 FINAL  Final  MRSA PCR Screening     Status: None   Collection Time: 03/02/15  4:12 PM  Result Value Ref Range Status   MRSA by PCR NEGATIVE NEGATIVE Final    Comment:        The GeneXpert MRSA Assay (FDA approved for NASAL specimens only), is one component of a comprehensive MRSA colonization surveillance program. It is not intended to diagnose MRSA infection nor to guide or monitor treatment for MRSA infections.   Respiratory virus panel     Status: None   Collection Time: 03/02/15  6:46 PM  Result Value Ref Range  Status   Respiratory Syncytial Virus A Negative Negative Final   Respiratory Syncytial Virus B Negative Negative Final   Influenza A Negative Negative Final   Influenza B Negative Negative Final   Parainfluenza 1 Negative Negative Final   Parainfluenza 2 Negative Negative Final   Parainfluenza 3 Negative Negative Final   Metapneumovirus Negative Negative Final   Rhinovirus Negative Negative Final   Adenovirus Negative Negative Final    Comment: (NOTE) Performed At: [facility] [place], [place] [id number] Lindon Romp MD [id number]   Urine culture     Status: None   Collection Time: 03/02/15  9:57 PM  Result Value Ref Range Status   Specimen Description URINE, CLEAN CATCH  Final   Special Requests NONE  Final   Culture   Final    40,000 COLONIES/ml ESCHERICHIA COLI Confirmed Extended Spectrum Beta-Lactamase Producer (ESBL) Performed at [facility]    Report Status 03/05/2015 FINAL  Final   Organism ID, Bacteria ESCHERICHIA COLI  Final      Susceptibility   Escherichia coli - MIC*    AMPICILLIN >=32 RESISTANT Resistant     CEFAZOLIN >=64 RESISTANT Resistant     CEFTRIAXONE >=64 RESISTANT Resistant     CIPROFLOXACIN >=4 RESISTANT Resistant     GENTAMICIN <=1 SENSITIVE Sensitive     IMIPENEM <=0.25 SENSITIVE Sensitive     NITROFURANTOIN <=16 SENSITIVE Sensitive     TRIMETH/SULFA >=320 RESISTANT Resistant     AMPICILLIN/SULBACTAM >=32 RESISTANT Resistant     PIP/TAZO 64 INTERMEDIATE Intermediate     * 40,000 COLONIES/ml ESCHERICHIA COLI     Medications:   . aspirin  81 mg Oral Daily  . atorvastatin  80  mg Oral Daily  . calcium carbonate  1 tablet Oral TID WC  . clopidogrel  75 mg Oral Daily  . furosemide  40 mg Intravenous BID  . guaiFENesin  600 mg Oral BID  . imipenem-cilastatin  500 mg Intravenous 3 times per day  . insulin aspart  0-5 Units Subcutaneous QHS  . insulin aspart  0-9 Units Subcutaneous TID WC  . levothyroxine   150 mcg Oral QAC breakfast  . mirtazapine  7.5 mg Oral QHS  . pantoprazole (PROTONIX) IV  40 mg Intravenous Q24H  . sodium chloride flush  10-40 mL Intracatheter Q12H  . vancomycin  750 mg Intravenous Q12H   Continuous Infusions:    Time spent: 35 minutes.  The patient is medically complex with multiple co-morbidities and is at high risk for clinical deterioration and requires high complexity decision making.   LOS: 9 days   MAGICK-Adebayo Ensminger, [facility]  [facility] Pager [phone]. If unable to reach me by pager, please call my cell phone at [phone]  Please refer to [email or web address], password [email or web address] to get updated schedule on who will round on this patient, as hospitalists switch teams weekly. If 7PM-7AM, please contact night-coverage at [email or web address], password [email or web address] for any overnight needs.  03/11/2015, 9:21 PM

## 2015-03-11 NOTE — Progress Notes (Signed)
PT Cancellation Note  Patient Details Name: [name] MRN: [id number] DOB: 1940-04-01   Cancelled Treatment:    Reason Eval/Treat Not Completed: Medical issues which prohibited therapy (moved to ICU after rapid response event.)   Claretha Cooper 03/11/2015, 7:10 AM Tresa Endo PT [phone]

## 2015-03-11 NOTE — Consult Note (Signed)
Cardiology Consult    Patient ID: Brittney Cunningham MRN: [id number], DOB/AGE: [birth year]   Admit date: 03/02/2015 Date of Consult: 03/11/2015  Primary Physician: [name], MD Primary Cardiologist: P. Martinique, MD  Requesting Provider: I. Doyle Askew, MD  Patient Profile    75 y/o ?w/ a h/o CAD, HTN, HL, and metastatic breast cancer, who was admitted with CAP and we've been asked to eval 2/2 tachycardia and new LV dysfxn.  Past Medical History   Past Medical History  Diagnosis Date  . Anemia     anemia of renal insuff and neoplastic dz (bone mets).  Pt on aranesp and B12, plus transfusion prn.  . Anxiety and depression   . GERD (gastroesophageal reflux disease)   . Hyperlipidemia   . Hypothyroidism   . Osteoarthritis   . Carotid artery stenosis     bilateral  . CAD (coronary artery disease)     a. 06/2013 MV: inflat infarct with small area of antlat and inf ischemia;  b. 06/2013 Cath: CTO RCA, LM/LAD patent, LCX mod/sev ostial dzs-->medical therapy, PCI of LCX would be too complex-med mgmt recommended.  . OSA (obstructive sleep apnea)   . Hypertension   . Unspecified constipation 03/15/2012  . Bursitis   . Collagen vascular disease ([place])   . History of radiation therapy 10/31/12-11/13/12    rt&lt humerous 30Gy/36fx  . Valvular heart disease 07/11/2013  . Diarrhea 09/28/2013  . Chronic renal insufficiency, stage II (mild)     Borderline stage II/III, CrCl 50s/60s.  . History of blood transfusion   . Urinary tract bacterial infections   . Breast cancer ([place]) 05/2009    Grade 2 invasive ductal carcinoma, T2 NXM1, stage IV at presentation. There was bone only involvement. New liver lesions 12/2014 (path confirmed mets): pt to start chemo (eribucin) treatments to start on February 17, 2015. She will receive treatment on the first and eighth day of each 21 day cycle. The plan is to go to 3 or 4 cycles before restaging  . Diabetes mellitus type II     insulin  . Hypocalcemia 07/2014   required IV calcium in ED once and was admitted to hosp once.  ? secondary to [facility] and everolimus?  . Drug-induced pneumonitis 08/2014    (Everolimus) CXR 09/21/14 improved; needs another CXR 2-3 wks later to make sure resolution occurs.    Past Surgical History  Procedure Laterality Date  . Incisional breast biopsy      remoted left breast biopsy  . Cesarean section    . Other surgical history      GYN surgery  . Knee surgery      right knee surgery  . Carotid endarterectomy  2011    right carotid  . Port-a-cath removal    . Modified radical mastectomy  Feb 2012    Right breast  . Carotid stent      left  . Cardiac catheterization  06/2013    old total occ. of RCA, moderately severe ostial LCX stenosis- medical therapy- would be complex PCI  . Carotid angiogram N/A 03/01/2011    Procedure: CAROTID ANGIOGRAM;  Surgeon: Conrad Monmouth, MD;  Location: [facility] CATH LAB;  Service: Cardiovascular;  Laterality: N/A;  . Arch aortogram  03/01/2011    Procedure: ARCH AORTOGRAM;  Surgeon: Conrad [place], MD;  Location: [facility] CATH LAB;  Service: Cardiovascular;;  . Carotid stent insertion Left 03/15/2011    Procedure: CAROTID STENT INSERTION;  Surgeon: Serafina Mitchell, MD;  Location:  [place] CATH LAB;  Service: Cardiovascular;  Laterality: Left;  . Left heart catheterization with coronary angiogram N/A 07/07/2013    Procedure: LEFT HEART CATHETERIZATION WITH CORONARY ANGIOGRAM;  Surgeon: Blane Ohara, MD;  Location: [facility] CATH LAB;  Service: Cardiovascular;  Laterality: N/A;  . Carotid dopplers  05/2014    Patent ICAs, bilat ECA dz: no change compared to 2014  . Transthoracic echocardiogram  06/2013    EF 55-60%, grd I diast dysfxn, mod mitral regurg     Allergies  Allergies  Allergen Reactions  . Chlorhexidine Hives, Itching and Rash    This was most likely a CONTACT DERMATITIS versus true systemic allergic reaction  . Adhesive [Tape] Hives  . Codeine Swelling    History of Present Illness    75 y/o  ?with the above complex past medical history. She does have a h/o CAD with known CTO of the RCA and mod obstructive LCX dzs (2015 Med Rx), HTN, DM, Carotid dzs s/p R CEA and L carotid stenting, and metastatic breast cancer and is actively being treated with chemoRx (neulasta 1/26/eribulin).  She was admitted to [facility] on 1/31 after presenting to her PCP w/ c/o progressive dyspnea and recent fall @ home.  She was febrile and hypotensive and referred to [facility] for further eval.  She was admitted with a dx of CAP.  She has had mild troponin elevation throughout admission.Urine cutures were abnl and chest CT on 2/4 showed RUL infiltrate compatible with pna along with small bilat pleural effusions.  She has since been receiving rocephin and zithromax.  On 2/8, she became lethargic and was noted to be tachycardic - sinus with frequent PAC's.  Lethargy occurred after receiving phenergan, ativan, and morphine.  Encephalopathy felt to be 2/2 meds.  She is more lucid this AM.  In setting of pleural effusions, tachy, and wt gain with evidence of volume excess, echo was repeated and revealed new LV dysfxn, with an EF of 30-35%, basal-midinf AK, mod TR, PASP 13mmHg.  We've been asked to eval.  Pt has not had c/p and currently denies dyspnea at rest.  Wt is up ~ 19 lbs since admission.  Inpatient Medications    . aspirin  81 mg Oral Daily  . atorvastatin  80 mg Oral Daily  . calcium carbonate  1 tablet Oral TID WC  . clopidogrel  75 mg Oral Daily  . guaiFENesin  600 mg Oral BID  . imipenem-cilastatin  500 mg Intravenous 3 times per day  . insulin aspart  0-5 Units Subcutaneous QHS  . insulin aspart  0-9 Units Subcutaneous TID WC  . levothyroxine  150 mcg Oral QAC breakfast  . mirtazapine  7.5 mg Oral QHS  . pantoprazole (PROTONIX) IV  40 mg Intravenous Q24H  . sodium chloride flush  10-40 mL Intracatheter Q12H  . vancomycin  750 mg Intravenous Q12H    Family History    Family History  Problem Relation Age of Onset    . Arthritis    . Coronary artery disease      first degree relative  . Stroke      first degree relative  . Diabetes Mother   . Heart disease Mother   . Hypertension Mother   . Cancer Mother   . Deep vein thrombosis Mother   . Colon cancer Neg Hx   . Stomach cancer Neg Hx   . Cancer Father     Pancreatic cancer  . Breast cancer Paternal Aunt 73  Social History    Social History   Social History  . Marital Status: Married    Spouse Name: N/A  . Number of Children: 3  . Years of Education: N/A   Occupational History  . 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 Smoking status: Former Smoker -- 1.00 packs/day for 20 years    Types: Cigarettes    Quit date: 01/31/1988  . Smokeless tobacco: Never Used  . Alcohol Use: No  . Drug Use: No  . Sexual Activity: Not Currently    Birth Control/ Protection: Post-menopausal   Other Topics Concern  . Not on file   Social History Narrative   Retired-ran a daycare facility for 40 years.   Married- 23 years    2 sons    1 daughter - Marine scientist midwife   Former smoker-quit in [place].      Review of Systems    General: lethargic. No chills, fever, night sweats. +++ weight gain.  Cardiovascular:  No chest pain, +++ dyspnea on exertion, +++ edema, +++ orthopnea, +++ palpitations, no paroxysmal nocturnal dyspnea. Dermatological: No rash, lesions/masses Respiratory: No cough, +++ dyspnea Urologic: No hematuria, dysuria Abdominal:   No nausea, vomiting, diarrhea, bright red blood per rectum, melena, or hematemesis Neurologic:  No visual changes, wkns, changes in mental status. All other systems reviewed and are otherwise negative except as noted above.  Physical Exam    Blood pressure 93/58, pulse 86, temperature 97.7 F (36.5 C), temperature source Oral, resp. rate 28, height 5\' 3"  (1.6 m), weight 178 lb 2.1 oz (80.8 kg), last menstrual period 03/13/2012, SpO2 100 %.  General: Pleasant, NAD Psych: Flat  affect. Neuro: Alert and oriented X 3. Moves all extremities spontaneously. HEENT: Normal  Neck: Supple without bruits.  JVP to jaw. Lungs:  Resp regular and unlabored, bibasilar crackles, diminished breath sounds overall. Heart: Irreg, tachy,  no s3, s4, or murmurs. Abdomen: full/semi-firm, non-tender, BS + x 4.  Extremities: No clubbing, cyanosis.  1-2+ bilat UE/LE edema. DP/PT/Radials 2+ and equal bilaterally.  Labs     Recent Labs  03/10/15 0115 03/10/15 0755 03/10/15 1410  TROPONINI 0.15* 0.16* 0.25*   Lab Results  Component Value Date   WBC 16.9* 03/11/2015   HGB 9.9* 03/11/2015   HCT 32.7* 03/11/2015   MCV 93.7 03/11/2015   PLT 135* 03/11/2015    Recent Labs Lab 03/11/15 0530  NA 142  K 4.9  CL 112*  CO2 15*  BUN 30*  CREATININE 1.14*  CALCIUM 7.5*  PROT 5.1*  BILITOT 1.1  ALKPHOS 147*  ALT 26  AST 89*  GLUCOSE 161*   Lab Results  Component Value Date   CHOL 134 03/02/2014   HDL 27.70* 03/02/2014   LDLCALC 76 03/02/2014   TRIG 151.0* 03/02/2014   Lab Results  Component Value Date   DDIMER 1.28* 07/05/2013     Radiology Studies    Dg Chest 2 View  03/02/2015  CLINICAL DATA:  Shortness of breath, cough and congestion since Sunday, history coronary artery disease, chronic renal insufficiency, coronary artery disease, hypertension, type II diabetes mellitus, metastatic breast cancer EXAM: CHEST  2 VIEW COMPARISON:  09/21/2014 FINDINGS: RIGHT subclavian Port-A-Cath with tip projecting over SVC. Normal heart size, mediastinal contours, and pulmonary vascularity. Atherosclerotic calcification aorta. Eventration of the anterior RIGHT diaphragm. Increased markings at anterior RIGHT lung base question chronic atelectasis. Remaining lungs grossly clear. No pleural effusion or pneumothorax. Diffuse osteosclerosis  again identified, patchy, likely related to known osseous metastatic disease. IMPRESSION: Chronic RIGHT basilar atelectasis. No definite acute pulmonary  abnormalities. Diffuse osseous metastatic disease. Electronically Signed   By: Lavonia Dana M.D.   On: 03/02/2015 12:33   Ct Chest Wo Contrast  03/06/2015  CLINICAL DATA:  Dyspnea. Dizziness, fatigue, and upper respiratory symptoms. Tachycardia, tachypnea, and fever. Metastatic right breast cancer currently undergoing chemotherapy. EXAM: CT CHEST WITHOUT CONTRAST TECHNIQUE: Multidetector CT imaging of the chest was performed following the standard protocol without IV contrast. COMPARISON:  01/12/2015 FINDINGS: There is new diffuse body wall edema and on organized subcutaneous fluid, greatest in the right lateral chest wall and incompletely visualized. Sequelae of right mastectomy are again identified. A right subclavian Port-A-Cath remains in place and terminates at the cavoatrial junction. Diffuse aortic and coronary artery calcifications are noted. The heart is normal in size. No enlarged axillary, mediastinal, or hilar lymph nodes are identified. Small left axillary lymph nodes measure up to 9 mm in short axis. Small mediastinal lymph nodes measure up to 8 mm in short axis. There are new, small bilateral pleural effusions with overlying dependent atelectasis and/or consolidation in the lower lobes. Emphysematous changes are greatest in the right upper lobe. There is new ground-glass and patchy consolidative opacity in the posterior right upper lobe. Small nodules along the right major fissure on the prior study are not well seen on this examination due to adjacent lung opacity and motion artifact. Subpleural reticulation is noted in the anterior right upper and right middle lobes related to prior radiation therapy. 5 mm left lower lobe nodule is unchanged (series 6, image 37). Small nodules along the left major fissure unchanged. Other small nodules seen on the prior study as well as plaque-like pleural thickening in the right lower lobe are obscured on the current examination by parenchymal  consolidation/atelectasis and motion artifact. Small focus of residual opacity in the lingula on the prior study has resolved. The numerous liver lesions described on the prior study are not well seen on this noncontrast examination. Calcified atherosclerosis is noted in the upper abdomen. Sclerotic osseous metastases are again seen throughout the visualized skeleton with multiple nonacute rib fractures again noted. IMPRESSION: 1. Posterior right upper lobe opacity compatible with pneumonia. 2. Small bilateral pleural effusions with bilateral lower lobe atelectasis/consolidation. 3. Multiple previously described lung nodules are obscured on the current examination due to the above acute changes. The 5 mm left lower lobe nodule is unchanged. 4. Similar appearance of diffuse osseous metastases. Electronically Signed   By: Logan Bores M.D.   On: 03/06/2015 16:41   Dg Chest Port 1 View  03/10/2015  CLINICAL DATA:  Pneumonia EXAM: PORTABLE CHEST 1 VIEW COMPARISON:  03/06/2015 FINDINGS: Cardiac shadow is stable in appearance. A right chest wall port is seen. Scattered patchy infiltrates are noted throughout both lungs similar to that seen on recent CT examination. Small bilateral pleural effusions right greater than left are again noted. Changes consistent with metastatic disease in the bone are again noted. IMPRESSION: No change in patchy bilateral pneumonia. Bilateral pleural effusions are seen. Stable bony metastatic disease. Electronically Signed   By: Inez Catalina M.D.   On: 03/10/2015 12:48    ECG & Cardiac Imaging    Sinus tach, freq pac's, 123, no acute st/t changes.  2D Echocardiogram 2.8.2017  Study Conclusions  - Left ventricle: The cavity size was normal. Wall thickness was   normal. Systolic function was moderately to severely reduced. The  estimated ejection fraction was in the range of 30% to 35%. There   is akinesis of the basal-midinferior myocardium. The study is not   technically  sufficient to allow evaluation of LV diastolic   function. - Aortic valve: Trileaflet; mildly thickened, mildly calcified   leaflets. - Mitral valve: Calcified annulus. Mildly thickened leaflets .   There was severe regurgitation. - Left atrium: The atrium was mildly dilated. - Tricuspid valve: There was moderate regurgitation. - Pulmonary arteries: Systolic pressure was moderately increased.   PA peak pressure: 53 mm Hg (S). _____________   Assessment & Plan    1.  RUL PNA: abx per IM/CCM.  2.  Acute systolic CHF:  Prev nl EF.  Now 30-35%.  Wt up 19 lbs since admission with diffuse body edema, JVD, and bibasilar crackles.  Etiology of LV dysfxn not clear at this point. She has not been having c/p.  Trop has been mildly elevated throughout admission with flat trend - likely representing demand ischemia.  Will add IV lasix.  Pressures have been soft, thus cannot necessarily add back home dose of coreg @ this point.  No acei/arb/arni for same reason.  Given acute illness/pna, would not pursue ischemic eval at this time.  This could be considered at some point in the future in the outpt setting, once she has recovered from acute illness, if felt to be appropriate.  3.  CAD/elevated troponin:  See #2. Known CTO of the RCA with mod LCX dzs.  Trop mildly elevated throughout admission.  No chest pain.  Echo shows new LV dysfxn.  She is on asa, statin, and plavix (indicated post carotid stenting - not on b/c of CAD).  She is on coreg @ home but this has been on hold in setting of relative hypotension.  If pressures tolerate diuresis, would look to add back bb, perhaps at a lower dose.  Would not pursue ischemic eval at this point.  4.  Tachycardia:  Sinus tach with freq PAC's and likely runs of atrial tachycardia.  No evidence of AF.  As above, bb has been on hold.  If she tolerates diuresis (BP), resume bb.  5.  Metastatic Breast Cancer:  Followed closely in [place].  Signed, Murray Hodgkins, NP 03/11/2015, 1:30 PM  As above, patient seen and examined. Briefly she is a 75 year old female with past medical history metastatic breast cancer, coronary artery disease, hypertension, diabetes mellitus, cerebrovascular disease for evaluation of abnormal troponin and newly reduced LV function.Patient admitted with pneumonia.. Echocardiogram shows LV function newly reduced at 30-35 with inferior akinesis.  Given history of coronary disease we will continue aspirin and statin. She is markedly volume overloaded on examination.This may be secondary to her reduced LV function but there also may be a contribution from low albumin/ddecreased oncotic pressure.Add Lasix and follow renal function. Her medications were adjusted recently because of reduced blood pressure. We will add ACE inhibitor and beta blocker later blood pressure and renal function allows. Given metastatic breast cancer to bone and liver she is not a candidate for aggressive cardiac evaluation. Her troponin is minimally elevated and not consistent with  Acute coronary syndrome. Kirk Ruths

## 2015-03-12 DIAGNOSIS — I340 Nonrheumatic mitral (valve) insufficiency: Secondary | ICD-10-CM | POA: Diagnosis present

## 2015-03-12 DIAGNOSIS — I491 Atrial premature depolarization: Secondary | ICD-10-CM | POA: Diagnosis present

## 2015-03-12 DIAGNOSIS — R601 Generalized edema: Secondary | ICD-10-CM

## 2015-03-12 DIAGNOSIS — I5021 Acute systolic (congestive) heart failure: Secondary | ICD-10-CM | POA: Diagnosis present

## 2015-03-12 DIAGNOSIS — I471 Supraventricular tachycardia: Secondary | ICD-10-CM | POA: Clinically undetermined

## 2015-03-12 LAB — GLUCOSE, CAPILLARY
GLUCOSE-CAPILLARY: 125 mg/dL — AB (ref 65–99)
GLUCOSE-CAPILLARY: 128 mg/dL — AB (ref 65–99)
Glucose-Capillary: 137 mg/dL — ABNORMAL HIGH (ref 65–99)
Glucose-Capillary: 154 mg/dL — ABNORMAL HIGH (ref 65–99)

## 2015-03-12 LAB — CBC
HCT: 29.7 % — ABNORMAL LOW (ref 36.0–46.0)
Hemoglobin: 9 g/dL — ABNORMAL LOW (ref 12.0–15.0)
MCH: 28.5 pg (ref 26.0–34.0)
MCHC: 30.3 g/dL (ref 30.0–36.0)
MCV: 94 fL (ref 78.0–100.0)
PLATELETS: 134 10*3/uL — AB (ref 150–400)
RBC: 3.16 MIL/uL — ABNORMAL LOW (ref 3.87–5.11)
RDW: 17 % — AB (ref 11.5–15.5)
WBC: 14.3 10*3/uL — ABNORMAL HIGH (ref 4.0–10.5)

## 2015-03-12 LAB — BASIC METABOLIC PANEL
Anion gap: 15 (ref 5–15)
BUN: 33 mg/dL — AB (ref 6–20)
CHLORIDE: 112 mmol/L — AB (ref 101–111)
CO2: 16 mmol/L — ABNORMAL LOW (ref 22–32)
CREATININE: 1.22 mg/dL — AB (ref 0.44–1.00)
Calcium: 8 mg/dL — ABNORMAL LOW (ref 8.9–10.3)
GFR calc Af Amer: 50 mL/min — ABNORMAL LOW (ref >60)
GFR, EST NON AFRICAN AMERICAN: 43 mL/min — AB (ref >60)
Glucose, Bld: 144 mg/dL — ABNORMAL HIGH (ref 65–99)
Potassium: 4.5 mmol/L (ref 3.5–5.1)
SODIUM: 143 mmol/L (ref 135–145)

## 2015-03-12 LAB — VANCOMYCIN, TROUGH: Vancomycin Tr: 20 ug/mL (ref 10.0–20.0)

## 2015-03-12 MED ORDER — CARVEDILOL 3.125 MG PO TABS
3.1250 mg | ORAL_TABLET | Freq: Two times a day (BID) | ORAL | Status: DC
  Administered 2015-03-12 – 2015-03-13 (×2): 3.125 mg via ORAL
  Filled 2015-03-12 (×2): qty 1

## 2015-03-12 NOTE — Progress Notes (Signed)
Pharmacy Antibiotic Note  Brittney Cunningham is a 75 y.o. female admitted on 03/02/2015 with pneumonia and UTI.  She has been on antibiotics since admission for PNA and ESBL Ecoli UTI.  Most recently she was on Rocephin & Zithromax, however experienced tachycardia and altered mental status overnight.  Due to acute changes she is being transferred to stepdown and antibiotic coverage is being broadened.  Pharmacy has been consulted for Primaxin & Vancomycin dosing starting 2/8  Plan: Vancomycin 750 IV every 12 hours.  Goal trough 15-20 mcg/mL. Primaxin 500mg  IV q8h  Will check vanc trough tonight prior to 6th dose but would consider deescalating antibiotics as appropriate as patient improves  Height: 5\' 3"  (160 cm) Weight: 179 lb 14.3 oz (81.6 kg) IBW/kg (Calculated) : 52.4  Temp (24hrs), Avg:97.8 F (36.6 C), Min:97.3 F (36.3 C), Max:98.2 F (36.8 C)   Recent Labs Lab 03/06/15 1955  03/08/15 0550 03/09/15 0504 03/10/15 0145 03/10/15 0755 03/10/15 1410 03/10/15 1800 03/11/15 0530 03/12/15 0720  WBC  --   < > 6.7 8.0 15.5*  --   --   --  16.9* 14.3*  CREATININE  --   < > 1.43* 1.07* 1.02*  --   --   --  1.14* 1.22*  LATICACIDVEN 2.3*  --   --   --   --  3.6* 3.3* 2.8*  --   --   < > = values in this interval not displayed.  Estimated Creatinine Clearance: 42.2 mL/min (by C-G formula based on Cr of 1.22).    Allergies  Allergen Reactions  . Chlorhexidine Hives, Itching and Rash    This was most likely a CONTACT DERMATITIS versus true systemic allergic reaction  . Adhesive [Tape] Hives  . Codeine Swelling    Antimicrobials this admission: Vanc 1/31>> 2/3  Zosyn 1/31>> 2/3; 2/8>> Primaxin 2/3 >> 2/4; 2/8>> 2/4 Fosfomycin 3g x 1 Rocephin 2/4>>2/8 Zithromax 2/4>>2/8  Dose adjustments this admission: 2/10 VT at 2300 = ___ on 750mg  IV q12  Microbiology results: 1/31BCx: NG 1/31 UCx: 40k E.coli (+) ESBL -sens Primaxin 1/31 Resp virus panel: neg  1/31 MRSA PCR: negative   1/31 Influenza PCR: negative   Thank you for allowing pharmacy to be a part of this patient's care.  Adrian Saran, PharmD, BCPS Pager [phone] 03/12/2015 8:16 AM

## 2015-03-12 NOTE — Progress Notes (Signed)
Patient: Brittney Cunningham / Admit Date: 03/02/2015 / Date of Encounter: 03/12/2015, 8:29 AM   Subjective: Feeling a little bit better than yesterday but still very generally weak.   Objective: Telemetry: NSR with PACs, occasional runs of MAT/PAT Physical Exam: Blood pressure 129/62, pulse 82, temperature 97.3 F (36.3 C), temperature source Axillary, resp. rate 26, height 5\' 3"  (1.6 m), weight 179 lb 14.3 oz (81.6 kg), last menstrual period 03/13/2012, SpO2 100 %. General: Chronically ill appearing WF in NAD. Head: Normocephalic, atraumatic, sclera non-icteric, no xanthomas, nares are without discharge. Neck: Supple. JVP mod elevated. Lungs: Diminished BS at bases without wheezes, rales, or rhonchi. Breathing is unlabored. Heart: RRR occasional ectopy S1 S2 without murmurs, rubs, or gallops.  Abdomen: Soft, non-tender, non-distended with normoactive bowel sounds. No rebound/guarding. Extremities: No clubbing or cyanosis. Diffuse edema UE/LE. Distal pedal pulses are 2+ and equal bilaterally. Neuro: Alert and oriented X 3. Moves all extremities spontaneously. Psych:  Responds to questions appropriately with a normal affect.   Intake/Output Summary (Last 24 hours) at 03/12/15 0829 Last data filed at 03/12/15 0000  Gross per 24 hour  Intake    520 ml  Output    450 ml  Net     70 ml    Inpatient Medications:  . aspirin  81 mg Oral Daily  . atorvastatin  80 mg Oral Daily  . calcium carbonate  1 tablet Oral TID WC  . clopidogrel  75 mg Oral Daily  . furosemide  40 mg Intravenous BID  . guaiFENesin  600 mg Oral BID  . imipenem-cilastatin  500 mg Intravenous 3 times per day  . insulin aspart  0-5 Units Subcutaneous QHS  . insulin aspart  0-9 Units Subcutaneous TID WC  . levothyroxine  150 mcg Oral QAC breakfast  . mirtazapine  7.5 mg Oral QHS  . pantoprazole (PROTONIX) IV  40 mg Intravenous Q24H  . sodium chloride flush  10-40 mL Intracatheter Q12H  . vancomycin  750 mg  Intravenous Q12H   Infusions:    Labs:  Recent Labs  03/10/15 0145 03/11/15 0530 03/12/15 0720  NA 139 142 143  K 5.1 4.9 4.5  CL 108 112* 112*  CO2 17* 15* 16*  GLUCOSE 166* 161* 144*  BUN 25* 30* 33*  CREATININE 1.02* 1.14* 1.22*  CALCIUM 7.9* 7.5* 8.0*  MG 1.6* 2.0  --     Recent Labs  03/11/15 0530  AST 89*  ALT 26  ALKPHOS 147*  BILITOT 1.1  PROT 5.1*  ALBUMIN 2.1*    Recent Labs  03/11/15 0530 03/12/15 0720  WBC 16.9* 14.3*  HGB 9.9* 9.0*  HCT 32.7* 29.7*  MCV 93.7 94.0  PLT 135* 134*    Recent Labs  03/10/15 0115 03/10/15 0755 03/10/15 1410  TROPONINI 0.15* 0.16* 0.25*   Invalid input(s): POCBNP No results for input(s): HGBA1C in the last 72 hours.   Radiology/Studies:  Dg Chest 2 View  03/02/2015  CLINICAL DATA:  Shortness of breath, cough and congestion since Sunday, history coronary artery disease, chronic renal insufficiency, coronary artery disease, hypertension, type II diabetes mellitus, metastatic breast cancer EXAM: CHEST  2 VIEW COMPARISON:  09/21/2014 FINDINGS: RIGHT subclavian Port-A-Cath with tip projecting over SVC. Normal heart size, mediastinal contours, and pulmonary vascularity. Atherosclerotic calcification aorta. Eventration of the anterior RIGHT diaphragm. Increased markings at anterior RIGHT lung base question chronic atelectasis. Remaining lungs grossly clear. No pleural effusion or pneumothorax. Diffuse osteosclerosis again identified, patchy, likely related to known  osseous metastatic disease. IMPRESSION: Chronic RIGHT basilar atelectasis. No definite acute pulmonary abnormalities. Diffuse osseous metastatic disease. Electronically Signed   By: Lavonia Dana M.D.   On: 03/02/2015 12:33   Ct Chest Wo Contrast  03/06/2015  CLINICAL DATA:  Dyspnea. Dizziness, fatigue, and upper respiratory symptoms. Tachycardia, tachypnea, and fever. Metastatic right breast cancer currently undergoing chemotherapy. EXAM: CT CHEST WITHOUT CONTRAST  TECHNIQUE: Multidetector CT imaging of the chest was performed following the standard protocol without IV contrast. COMPARISON:  01/12/2015 FINDINGS: There is new diffuse body wall edema and on organized subcutaneous fluid, greatest in the right lateral chest wall and incompletely visualized. Sequelae of right mastectomy are again identified. A right subclavian Port-A-Cath remains in place and terminates at the cavoatrial junction. Diffuse aortic and coronary artery calcifications are noted. The heart is normal in size. No enlarged axillary, mediastinal, or hilar lymph nodes are identified. Small left axillary lymph nodes measure up to 9 mm in short axis. Small mediastinal lymph nodes measure up to 8 mm in short axis. There are new, small bilateral pleural effusions with overlying dependent atelectasis and/or consolidation in the lower lobes. Emphysematous changes are greatest in the right upper lobe. There is new ground-glass and patchy consolidative opacity in the posterior right upper lobe. Small nodules along the right major fissure on the prior study are not well seen on this examination due to adjacent lung opacity and motion artifact. Subpleural reticulation is noted in the anterior right upper and right middle lobes related to prior radiation therapy. 5 mm left lower lobe nodule is unchanged (series 6, image 37). Small nodules along the left major fissure unchanged. Other small nodules seen on the prior study as well as plaque-like pleural thickening in the right lower lobe are obscured on the current examination by parenchymal consolidation/atelectasis and motion artifact. Small focus of residual opacity in the lingula on the prior study has resolved. The numerous liver lesions described on the prior study are not well seen on this noncontrast examination. Calcified atherosclerosis is noted in the upper abdomen. Sclerotic osseous metastases are again seen throughout the visualized skeleton with multiple  nonacute rib fractures again noted. IMPRESSION: 1. Posterior right upper lobe opacity compatible with pneumonia. 2. Small bilateral pleural effusions with bilateral lower lobe atelectasis/consolidation. 3. Multiple previously described lung nodules are obscured on the current examination due to the above acute changes. The 5 mm left lower lobe nodule is unchanged. 4. Similar appearance of diffuse osseous metastases. Electronically Signed   By: Logan Bores M.D.   On: 03/06/2015 16:41   Dg Chest Port 1 View  03/10/2015  CLINICAL DATA:  Pneumonia EXAM: PORTABLE CHEST 1 VIEW COMPARISON:  03/06/2015 FINDINGS: Cardiac shadow is stable in appearance. A right chest wall port is seen. Scattered patchy infiltrates are noted throughout both lungs similar to that seen on recent CT examination. Small bilateral pleural effusions right greater than left are again noted. Changes consistent with metastatic disease in the bone are again noted. IMPRESSION: No change in patchy bilateral pneumonia. Bilateral pleural effusions are seen. Stable bony metastatic disease. Electronically Signed   By: Inez Catalina M.D.   On: 03/10/2015 12:48     Assessment and Plan  32F with CAD (cath 06/2013: CTO RCA, mod-sev oCx disease - PCI of Cx would be too complex so managed medically), HTN, HLD, metastatic breast CA to cone, anemia, CKD stage II, DM, carotid disease (s/p R CEA, L stenting). Admitted 03/02/2015 to [facility] with CAP and recent fall at  home. Developed encephalopathy during admission felt 2/2 meds (phenergan, ativan, morphine). Also developed tachy - NSR with freq PACs versus MAT. Also noted to have weight gain and pleural effusions with 2D echo 03/10/15: EF 30-35%, akinesis of basal inferior myocardium, severe TR, mod MR, mildly dilated LA, PASP 34mmHg. (Previous echo 06/2013 EF 55-60%, mod MR.)   1. RUL PNA, ESBL UTI, and sepsis - per IM/CCM.  2. Acute systolic CHF with severe mitral regurgitation, also componennt of volume overload from  hypoalbuminemia and fluid resuscitation - IV Lasix started 2/9 - outpatient weights ~159. Would continue for now. BP may support re-addition of beta blocker today. Will d/w MD. Can consider adding ACEI down the road but will hold off given recent AKI and hypotension.  3. CAD/elevated troponin - elevated trop not felt c/w ACS but instead possible demand ischemia. Given metastatic breast cancer to bone and liver she is not felt to be a candidate for aggressive cardiac evaluation at present time. She remains on ASA + Plavix for medical therapy (although Plavix is due to h/o carotid stenting, not because of prior coronary disease). Continue statin but f/u CMET in AM given slightly elevated AST.  4. Tachycardia - appears to be sinus tach with PACs, + MAT/PAT. See above re: BB. Also note abnormal TSH.  5. Abnormal TSH - per IM.  6. CKD stage II-III with hyperkalemia/hypomagnesemia/hypocalcemia - lytes managed by IM.  Signed, Melina Copa PA-C Pager: [phone] As above, patient seen and examined. She denies chest pain or dyspnea this morning. She remains volume overloaded likely from a combination of reduced LV function and low oncotic pressure from decreased albumin. Continue Lasix and follow renal function. She is somewhat tachycardic with PAT noted on telemetry. Her blood pressure has improved. We will begin low-dose carvedilol both for cardiomyopathy and for rhythm. We will consider an ACE inhibitor later once it is clear her blood pressure and renal function stable. Plan medical therapy as she is not a good candidate for aggressive cardiac evaluation given metastatic cancer.  Kirk Ruths

## 2015-03-12 NOTE — Progress Notes (Signed)
Progress Note   Brittney Cunningham [id number] DOB: 1940/06/28 DOA: 03/02/2015 PCP: [name], MD   Brief Narrative:   75 y.o. female with a PMH of stage IV right breast cancer diagnosed May/2011 with metastasis to the bone and liver status post neoadjuvant therapy, right modified radical mastectomy 03/2010 and radiation treatment, under the care of Dr. [name] who is currently treating her for progressive disease with eribulin with last dose given 02/24/15 followed by Neulasta support, who was admitted 03/02/15 with dizziness, fatigue and upper respiratory symptoms. Upon initial presentation, she was found to be tachycardic, tachypneic with a fever of 100.8. She was admitted for possible sepsis.  Major events since admission: 2/4 - CT chest confirmed RUL PNA, zithro and rocephin started, fosfomycin x1 given for ESBL  2/8 - more lethargic this AM, transfer to SDU, broaden ABX coverage, sepsis protocol re initiated, PCCM consulted  2/9 - cardio consulted due to persistently elevated trop and EF of 35% on ECHO  2/10 - doing better, still volume overloaded, keep in SDU, advance diet soft   Assessment/Plan:   Principal Problem:  Sepsis ([place]), present on admission  - Source thought to be from HCAP. ESBL UTI - pt has been on vancomycin and zosyn since 1/31 and was changed to zithromax and rocephin 2/4 as pt was improving  - given one dose of fosfomycin 2/4 to target ESBL, the therapy now completed for ESBL - transferred to SDU again 2/8, hold sedating medications - lactic acid and procalcitonin, WBC elevated so sepsis protocol re initiated, ABX coverage broadened 2/8 - today is day #3 of broad spectrum ABX vancomycin and imipenem  - clinically improving, more alert this AM   Active Problems:   Irregular heart sounds - no chest pain this AM but again, pt reports feeling tired and worse this AM  - troponins have been rather flat since admission - ECHO with EF 35%, cardiology  consulted, assistance is appreciated  - keep electrolytes stable, K ~4, Mg ~2     Elevated troponin secondary to demand ischemia / acute systolic CHF - in the setting of sepsis, still rather flat troponins, no need for further repeat - ECHO with EF 35%, lasix started per cardiology team, continue and monitor daily weights, strict I/O    Thrombocytopenia - stopped all heparin products 2/2 - SCD's for DVT prophylaxis  - Plt remain overall stable  - CBC in AM    Anasarca - diffuse volume overload  - in the setting of hypoalbuminemia and volume resuscitation in the setting of sepsis and hypotension, low EF of 35% - lasix started per cardiology  - continue and monitor response to Lasix, may need to increase the dose    Hypothyroidism - Continue Synthroid.   Essential hypertension - held Coreg 2/5 due to soft BP, BP better this AM but will continue to hold Coreg for now - may resume in AM   Breast cancer metastasized to bone [facility]) and liver - Under the care of Dr. [name]. Status post chemotherapy 02/24/15 followed by Neulasta support. - appreciate Dr. 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   Diabetes mellitus with complication (HCC) / hypoglycemia - Use insulin sensitive SSI for now until oral intake improves    Anemia of chronic renal failure, stage 4 (severe) (HCC) - Has required unit of blood transfusion while inpatient - no signs of active bleeding, Hg up from 7.8 --> 7.6 --> 8.0 --> 9.0 --> 9.9 --> 9.0 this  AM - CBC in AM   Pain from bone metastases (HCC) - holding scheduled MS continue as pt was more lethargic 2/5 - allow analgesia as needed only    DVT prophylaxis - SCD's  Family Communication/Anticipated D/C date and plan/Code Status   Family Communication: brother at bedside, daughter over the phone  Disposition Plan: SNF by 2/13, unable to d/c at this time  Code Status: Full code.  IV Access:    Peripheral IV  Procedures and diagnostic studies:    Dg Chest 2 View 03/02/2015 Chronic RIGHT basilar atelectasis. No acute pulmonary abnormalities. Diffuse osseous metastases.  Ct Chest Wo Contrast  03/06/2015  Posterior RUL PNA, bilateral pleural effusions with bilateral lower lobe atelectasis/consolidation, multiple lung nodules, 5 mm LLL nodule unchanged.  Medical Consultants:    None.  Anti-Infectives:    Vancomycin 03/02/15---> 2/3  Zosyn 03/02/15---> 2/3  Primaxin 2/3 --> change to fosfomycin x 1 dose  Zithromax and Rocephin 2/4 --> 2/8  Vancomycin and Imipenem 2/8 -->  Subjective:   Reports feeling better this AM, wants to have diet advanced to solids.   Objective:    Filed Vitals:   03/12/15 1600 03/12/15 1700 03/12/15 1800 03/12/15 1810  BP: 114/44 140/66 140/65 141/65  Pulse: 86 77 80 94  Temp:      TempSrc:      Resp: 15 22 34   Height:      Weight:      SpO2: 97% 100% 99%     Intake/Output Summary (Last 24 hours) at 03/12/15 1823 Last data filed at 03/12/15 1430  Gross per 24 hour  Intake    520 ml  Output    450 ml  Net     70 ml   Filed Weights   03/10/15 0506 03/11/15 0400 03/12/15 0500  Weight: 83.7 kg (184 lb 8.4 oz) 80.8 kg (178 lb 2.1 oz) 81.6 kg (179 lb 14.3 oz)    Exam: Gen:  More alert this AM, NAD Cardiovascular:  Sinus rhythm, S1/S2 Respiratory:  Lungs with diminished breath sounds at bases, poor inspiratory effor  Gastrointestinal:  Abdomen soft, NT/ND, + BS Extremities:  Upper and lower extremity bilateral  edema   Data Reviewed:    Labs: Basic Metabolic Panel:  Recent Labs Lab 03/06/15 1959 03/07/15 0405 03/08/15 0550 03/09/15 0504 03/10/15 0145 03/11/15 0530 03/12/15 0720  NA 137 135 138 139 139 142 143  K 5.1 4.6 4.4 4.4 5.1 4.9 4.5  CL 107 106 107 109 108 112* 112*  CO2 20* 19* 22 22 17* 15* 16*  GLUCOSE 127* 125* 95 106* 166* 161* 144*  BUN 38* 37* 37* 31* 25* 30* 33*  CREATININE 1.68* 1.65* 1.43* 1.07* 1.02* 1.14* 1.22*  CALCIUM 7.3* 7.0* 7.3* 7.7* 7.9* 7.5* 8.0*  MG 1.8 1.8  --  1.7 1.6* 2.0  --     CBC:  Recent Labs Lab 03/08/15 0550 03/09/15 0504 03/10/15 0145 03/11/15 0530 03/12/15 0720  WBC  6.7 8.0 15.5* 16.9* 14.3*  HGB 7.6* 8.0* 9.0* 9.9* 9.0*  HCT 25.1* 26.2* 28.5* 32.7* 29.7*  MCV 95.1 93.6 90.8 93.7 94.0  PLT 94* 103* 124* 135* 134*   Sepsis Labs:  Recent Labs Lab 03/06/15 1955  03/09/15 0504 03/10/15 0145 03/10/15 0755 03/10/15 1410 03/10/15 1800 03/11/15 0530 03/12/15 0720  PROCALCITON  --   --   --   --  0.39  --   --   --   --   WBC  --   < > 8.0 15.5*  --   --   --  16.9* 14.3*  LATICACIDVEN 2.3*  --   --   --  3.6* 3.3* 2.8*  --   --   < > = values in this interval not displayed. Microbiology Recent Results (from the past 240 hour(s))  Respiratory virus panel     Status: None   Collection Time: 03/02/15  6:46 PM  Result Value Ref Range Status   Respiratory Syncytial Virus A Negative Negative Final   Respiratory Syncytial Virus B Negative Negative Final   Influenza A Negative Negative Final   Influenza B Negative Negative Final   Parainfluenza 1 Negative Negative Final   Parainfluenza 2 Negative Negative Final   Parainfluenza 3 Negative Negative Final   Metapneumovirus Negative Negative Final   Rhinovirus Negative Negative Final   Adenovirus Negative Negative Final    Comment: (NOTE) Performed At: [facility] [place], [place] [id number] Lindon Romp MD [id number]   Urine culture      Status: None   Collection Time: 03/02/15  9:57 PM  Result Value Ref Range Status   Specimen Description URINE, CLEAN CATCH  Final   Special Requests NONE  Final   Culture   Final    40,000 COLONIES/ml ESCHERICHIA COLI Confirmed Extended Spectrum Beta-Lactamase Producer (ESBL) Performed at [facility]    Report Status 03/05/2015 FINAL  Final   Organism ID, Bacteria ESCHERICHIA COLI  Final      Susceptibility   Escherichia coli - MIC*    AMPICILLIN >=32 RESISTANT Resistant     CEFAZOLIN >=64 RESISTANT Resistant     CEFTRIAXONE >=64 RESISTANT Resistant     CIPROFLOXACIN >=4 RESISTANT Resistant     GENTAMICIN <=1 SENSITIVE Sensitive     IMIPENEM <=0.25 SENSITIVE Sensitive     NITROFURANTOIN <=16 SENSITIVE Sensitive     TRIMETH/SULFA >=320 RESISTANT Resistant     AMPICILLIN/SULBACTAM >=32 RESISTANT Resistant     PIP/TAZO 64 INTERMEDIATE Intermediate     * 40,000 COLONIES/ml ESCHERICHIA COLI     Medications:   . aspirin  81 mg Oral Daily  . atorvastatin  80 mg Oral Daily  . calcium carbonate  1 tablet Oral TID WC  . carvedilol  3.125 mg Oral BID WC  . clopidogrel  75 mg Oral Daily  . furosemide  40 mg Intravenous BID  . guaiFENesin  600 mg Oral BID  . imipenem-cilastatin  500 mg Intravenous 3 times per day  . insulin aspart  0-5 Units Subcutaneous QHS  . insulin aspart  0-9 Units Subcutaneous TID WC  . levothyroxine  150 mcg Oral QAC breakfast  . mirtazapine  7.5 mg Oral QHS  . pantoprazole (PROTONIX) IV  40 mg Intravenous Q24H  . sodium chloride flush  10-40 mL Intracatheter Q12H  . vancomycin  750 mg Intravenous Q12H   Continuous Infusions:    Time spent: 35 minutes.  The patient is medically  complex with multiple co-morbidities and is at high risk for clinical deterioration and requires high complexity decision making.   LOS: 10 days   Faye Ramsay  [facility] Pager [phone]. If unable to reach me by pager, please call my cell phone at  [phone]  Please refer to [email or web address], password [email or web address] to get updated schedule on who will round on this patient, as hospitalists switch teams weekly. If 7PM-7AM, please contact night-coverage at [email or web address], password [email or web address] for any overnight needs.  03/12/2015, 6:23 PM

## 2015-03-13 LAB — COMPREHENSIVE METABOLIC PANEL
ALT: 14 U/L (ref 14–54)
AST: 78 U/L — ABNORMAL HIGH (ref 15–41)
Albumin: 2.2 g/dL — ABNORMAL LOW (ref 3.5–5.0)
Alkaline Phosphatase: 140 U/L — ABNORMAL HIGH (ref 38–126)
Anion gap: 10 (ref 5–15)
BUN: 29 mg/dL — AB (ref 6–20)
CHLORIDE: 110 mmol/L (ref 101–111)
CO2: 24 mmol/L (ref 22–32)
Calcium: 7.9 mg/dL — ABNORMAL LOW (ref 8.9–10.3)
Creatinine, Ser: 1.06 mg/dL — ABNORMAL HIGH (ref 0.44–1.00)
GFR calc non Af Amer: 51 mL/min — ABNORMAL LOW (ref >60)
GFR, EST AFRICAN AMERICAN: 59 mL/min — AB (ref >60)
Glucose, Bld: 129 mg/dL — ABNORMAL HIGH (ref 65–99)
POTASSIUM: 3.6 mmol/L (ref 3.5–5.1)
Sodium: 144 mmol/L (ref 135–145)
Total Bilirubin: 1.1 mg/dL (ref 0.3–1.2)
Total Protein: 4.8 g/dL — ABNORMAL LOW (ref 6.5–8.1)

## 2015-03-13 LAB — CBC
HEMATOCRIT: 27 % — AB (ref 36.0–46.0)
Hemoglobin: 8.2 g/dL — ABNORMAL LOW (ref 12.0–15.0)
MCH: 28.5 pg (ref 26.0–34.0)
MCHC: 30.4 g/dL (ref 30.0–36.0)
MCV: 93.8 fL (ref 78.0–100.0)
PLATELETS: 122 10*3/uL — AB (ref 150–400)
RBC: 2.88 MIL/uL — AB (ref 3.87–5.11)
RDW: 16.9 % — AB (ref 11.5–15.5)
WBC: 9.4 10*3/uL (ref 4.0–10.5)

## 2015-03-13 LAB — GLUCOSE, CAPILLARY
GLUCOSE-CAPILLARY: 135 mg/dL — AB (ref 65–99)
GLUCOSE-CAPILLARY: 167 mg/dL — AB (ref 65–99)
Glucose-Capillary: 114 mg/dL — ABNORMAL HIGH (ref 65–99)
Glucose-Capillary: 156 mg/dL — ABNORMAL HIGH (ref 65–99)

## 2015-03-13 MED ORDER — CARVEDILOL 6.25 MG PO TABS
6.2500 mg | ORAL_TABLET | Freq: Two times a day (BID) | ORAL | Status: DC
  Administered 2015-03-13 – 2015-03-14 (×2): 6.25 mg via ORAL
  Filled 2015-03-13 (×2): qty 1

## 2015-03-13 MED ORDER — SODIUM CHLORIDE 0.9 % IV SOLN
500.0000 mg | Freq: Two times a day (BID) | INTRAVENOUS | Status: DC
  Administered 2015-03-13 – 2015-03-15 (×5): 500 mg via INTRAVENOUS
  Filled 2015-03-13 (×7): qty 500

## 2015-03-13 NOTE — Progress Notes (Signed)
Pharmacy Antibiotic Note  Brittney Cunningham is a 75 y.o. female admitted on 03/02/2015 with pneumonia and UTI.  She has been on antibiotics since admission for PNA and ESBL Ecoli UTI.  Most recently she was on Rocephin & Zithromax, however experienced tachycardia and altered mental status overnight.  Due to acute changes she is being transferred to stepdown and antibiotic coverage is being broadened.  Pharmacy has been consulted for Primaxin & Vancomycin dosing starting 2/8  Plan:   VT=20 mg/L will reduce vancomycin to 500mg  IV q12h,  would consider deescalating antibiotics as appropriate as patient improves   Height: 5\' 3"  (160 cm) Weight: 179 lb 14.3 oz (81.6 kg) IBW/kg (Calculated) : 52.4  Temp (24hrs), Avg:97.5 F (36.4 C), Min:97.1 F (36.2 C), Max:98 F (36.7 C)   Recent Labs Lab 03/06/15 1955  03/08/15 0550 03/09/15 0504 03/10/15 0145 03/10/15 0755 03/10/15 1410 03/10/15 1800 03/11/15 0530 03/12/15 0720 03/12/15 2210  WBC  --   < > 6.7 8.0 15.5*  --   --   --  16.9* 14.3*  --   CREATININE  --   < > 1.43* 1.07* 1.02*  --   --   --  1.14* 1.22*  --   LATICACIDVEN 2.3*  --   --   --   --  3.6* 3.3* 2.8*  --   --   --   VANCOTROUGH  --   --   --   --   --   --   --   --   --   --  20  < > = values in this interval not displayed.  Estimated Creatinine Clearance: 42.2 mL/min (by C-G formula based on Cr of 1.22).    Allergies  Allergen Reactions  . Chlorhexidine Hives, Itching and Rash    This was most likely a CONTACT DERMATITIS versus true systemic allergic reaction  . Adhesive [Tape] Hives  . Codeine Swelling    Antimicrobials this admission: Vanc 1/31>> 2/3  Zosyn 1/31>> 2/3; 2/8>> Primaxin 2/3 >> 2/4; 2/8>> 2/4 Fosfomycin 3g x 1 Rocephin 2/4>>2/8 Zithromax 2/4>>2/8  Dose adjustments this admission: 2/10 VT at 2300 = 20 on 750mg  IV q12, decrease Vanc to 500 mg q12h  Microbiology results: 1/31BCx: NG 1/31 UCx: 40k E.coli (+) ESBL -sens Primaxin 1/31 Resp  virus panel: neg  1/31 MRSA PCR: negative  1/31 Influenza PCR: negative   Thank you for allowing pharmacy to be a part of this patient's care.   Dorrene German 03/13/2015 2:02 AM

## 2015-03-13 NOTE — Progress Notes (Signed)
Patient: Brittney Cunningham / Admit Date: 03/02/2015 / Date of Encounter: 03/13/2015, 8:48 AM   Subjective: Denies dyspnea or chest pain; complains of knee pain   Objective: Telemetry: NSR with PACs, occasional runs of MAT/PAT Physical Exam: Blood pressure 129/62, pulse 80, temperature 96.1 F (35.6 C), temperature source Axillary, resp. rate 22, height 5\' 3"  (1.6 m), weight 176 lb 2.4 oz (79.9 kg), last menstrual period 03/13/2012, SpO2 100 %. General: Chronically ill appearing WF in NAD. Head: Normal Neck: Supple. Lungs: Diminished BS at bases  Heart: RRR  Abdomen: Soft, non-tender, non-distended; no masses Extremities: 1+ edema  Neuro: Alert and oriented X 3. Moves all extremities spontaneously.    Intake/Output Summary (Last 24 hours) at 03/13/15 0848 Last data filed at 03/13/15 0800  Gross per 24 hour  Intake    573 ml  Output   1350 ml  Net   -777 ml    Inpatient Medications:  . aspirin  81 mg Oral Daily  . atorvastatin  80 mg Oral Daily  . calcium carbonate  1 tablet Oral TID WC  . carvedilol  3.125 mg Oral BID WC  . clopidogrel  75 mg Oral Daily  . furosemide  40 mg Intravenous BID  . guaiFENesin  600 mg Oral BID  . imipenem-cilastatin  500 mg Intravenous 3 times per day  . insulin aspart  0-5 Units Subcutaneous QHS  . insulin aspart  0-9 Units Subcutaneous TID WC  . levothyroxine  150 mcg Oral QAC breakfast  . mirtazapine  7.5 mg Oral QHS  . pantoprazole (PROTONIX) IV  40 mg Intravenous Q24H  . sodium chloride flush  10-40 mL Intracatheter Q12H  . vancomycin  500 mg Intravenous Q12H   Infusions:    Labs:  Recent Labs  03/11/15 0530 03/12/15 0720 03/13/15 0500  NA 142 143 144  K 4.9 4.5 3.6  CL 112* 112* 110  CO2 15* 16* 24  GLUCOSE 161* 144* 129*  BUN 30* 33* 29*  CREATININE 1.14* 1.22* 1.06*  CALCIUM 7.5* 8.0* 7.9*  MG 2.0  --   --     Recent Labs  03/11/15 0530 03/13/15 0500  AST 89* 78*  ALT 26 14  ALKPHOS 147* 140*  BILITOT 1.1 1.1    PROT 5.1* 4.8*  ALBUMIN 2.1* 2.2*    Recent Labs  03/12/15 0720 03/13/15 0500  WBC 14.3* 9.4  HGB 9.0* 8.2*  HCT 29.7* 27.0*  MCV 94.0 93.8  PLT 134* 122*    Recent Labs  03/10/15 1410  TROPONINI 0.25*   Invalid input(s): POCBNP No results for input(s): HGBA1C in the last 72 hours.   Radiology/Studies:  Dg Chest 2 View  03/02/2015  CLINICAL DATA:  Shortness of breath, cough and congestion since Sunday, history coronary artery disease, chronic renal insufficiency, coronary artery disease, hypertension, type II diabetes mellitus, metastatic breast cancer EXAM: CHEST  2 VIEW COMPARISON:  09/21/2014 FINDINGS: RIGHT subclavian Port-A-Cath with tip projecting over SVC. Normal heart size, mediastinal contours, and pulmonary vascularity. Atherosclerotic calcification aorta. Eventration of the anterior RIGHT diaphragm. Increased markings at anterior RIGHT lung base question chronic atelectasis. Remaining lungs grossly clear. No pleural effusion or pneumothorax. Diffuse osteosclerosis again identified, patchy, likely related to known osseous metastatic disease. IMPRESSION: Chronic RIGHT basilar atelectasis. No definite acute pulmonary abnormalities. Diffuse osseous metastatic disease. Electronically Signed   By: Lavonia Dana M.D.   On: 03/02/2015 12:33   Ct Chest Wo Contrast  03/06/2015  CLINICAL DATA:  Dyspnea. Dizziness,  fatigue, and upper respiratory symptoms. Tachycardia, tachypnea, and fever. Metastatic right breast cancer currently undergoing chemotherapy. EXAM: CT CHEST WITHOUT CONTRAST TECHNIQUE: Multidetector CT imaging of the chest was performed following the standard protocol without IV contrast. COMPARISON:  01/12/2015 FINDINGS: There is new diffuse body wall edema and on organized subcutaneous fluid, greatest in the right lateral chest wall and incompletely visualized. Sequelae of right mastectomy are again identified. A right subclavian Port-A-Cath remains in place and terminates at  the cavoatrial junction. Diffuse aortic and coronary artery calcifications are noted. The heart is normal in size. No enlarged axillary, mediastinal, or hilar lymph nodes are identified. Small left axillary lymph nodes measure up to 9 mm in short axis. Small mediastinal lymph nodes measure up to 8 mm in short axis. There are new, small bilateral pleural effusions with overlying dependent atelectasis and/or consolidation in the lower lobes. Emphysematous changes are greatest in the right upper lobe. There is new ground-glass and patchy consolidative opacity in the posterior right upper lobe. Small nodules along the right major fissure on the prior study are not well seen on this examination due to adjacent lung opacity and motion artifact. Subpleural reticulation is noted in the anterior right upper and right middle lobes related to prior radiation therapy. 5 mm left lower lobe nodule is unchanged (series 6, image 37). Small nodules along the left major fissure unchanged. Other small nodules seen on the prior study as well as plaque-like pleural thickening in the right lower lobe are obscured on the current examination by parenchymal consolidation/atelectasis and motion artifact. Small focus of residual opacity in the lingula on the prior study has resolved. The numerous liver lesions described on the prior study are not well seen on this noncontrast examination. Calcified atherosclerosis is noted in the upper abdomen. Sclerotic osseous metastases are again seen throughout the visualized skeleton with multiple nonacute rib fractures again noted. IMPRESSION: 1. Posterior right upper lobe opacity compatible with pneumonia. 2. Small bilateral pleural effusions with bilateral lower lobe atelectasis/consolidation. 3. Multiple previously described lung nodules are obscured on the current examination due to the above acute changes. The 5 mm left lower lobe nodule is unchanged. 4. Similar appearance of diffuse osseous  metastases. Electronically Signed   By: Logan Bores M.D.   On: 03/06/2015 16:41   Dg Chest Port 1 View  03/10/2015  CLINICAL DATA:  Pneumonia EXAM: PORTABLE CHEST 1 VIEW COMPARISON:  03/06/2015 FINDINGS: Cardiac shadow is stable in appearance. A right chest wall port is seen. Scattered patchy infiltrates are noted throughout both lungs similar to that seen on recent CT examination. Small bilateral pleural effusions right greater than left are again noted. Changes consistent with metastatic disease in the bone are again noted. IMPRESSION: No change in patchy bilateral pneumonia. Bilateral pleural effusions are seen. Stable bony metastatic disease. Electronically Signed   By: Inez Catalina M.D.   On: 03/10/2015 12:48     Assessment and Plan  36F with CAD (cath 06/2013: CTO RCA, mod-sev oCx disease - PCI of Cx would be too complex so managed medically), HTN, HLD, metastatic breast CA to bone/liver, anemia, CKD stage II, DM, carotid disease (s/p R CEA, L stenting). Admitted 03/02/2015 to [facility] with CAP and recent fall at home. Developed encephalopathy during admission felt 2/2 meds (phenergan, ativan, morphine). Also developed tachy - NSR with freq PACs versus MAT. Also noted to have weight gain and pleural effusions with 2D echo 03/10/15: EF 30-35%, akinesis of basal inferior myocardium, severe TR, mod MR,  mildly dilated LA, PASP 61mmHg. (Previous echo 06/2013 EF 55-60%, mod MR.)   1. RUL PNA, ESBL UTI, and sepsis - per IM/CCM.  2. Acute systolic CHF with severe mitral regurgitation, also componennt of volume overload from hypoalbuminemia and fluid resuscitation - IV Lasix started 2/9 - outpatient weights ~159. Continue diuresis; volume status improving. Change coreg to 6.25 BID; We will consider an ACE inhibitor later once it is clear her blood pressure and renal function stable.   3. CAD/elevated troponin - elevated trop not felt c/w ACS. Given metastatic breast cancer to bone and liver she is not felt to be a  candidate for aggressive cardiac evaluation at present time. She remains on ASA + Plavix for medical therapy (although Plavix is due to h/o carotid stenting, not because of prior coronary disease). Continue statin.  4. Tachycardia - appears to be sinus tach with PACs, + PAT. Increase coreg to 6.25 BID  5. Abnormal TSH - per IM.   Signed, Kirk Ruths

## 2015-03-13 NOTE — Progress Notes (Signed)
Progress Note   Brittney Cunningham [id number] DOB: [birth year] DOA: 03/02/2015 PCP: [name], MD   Brief Narrative:   75 y.o. female with a PMH of stage IV right breast cancer diagnosed May/2011 with metastasis to the bone and liver status post neoadjuvant therapy, right modified radical mastectomy 03/2010 and radiation treatment, under the care of Dr. [name] who is currently treating her for progressive disease with eribulin with last dose given 02/24/15 followed by Neulasta support, who was admitted 03/02/15 with dizziness, fatigue and upper respiratory symptoms. Upon initial presentation, she was found to be tachycardic, tachypneic with a fever of 100.8. She was admitted for possible sepsis.  Major events since admission: 2/4 - CT chest confirmed RUL PNA, zithro and rocephin started, fosfomycin x1 given for ESBL  2/8 - more lethargic this AM, transfer to SDU, broaden ABX coverage, sepsis protocol re initiated, PCCM consulted  2/9 - cardio consulted due to persistently elevated trop and EF of 35% on ECHO  2/10 - doing better, still volume overloaded, keep in SDU, advance diet soft  2/11 - weight trending down, transfer to telemetry unit   Assessment/Plan:   Principal Problem:  Sepsis ([place]), present on admission  - Source thought to be from HCAP. ESBL UTI - pt has been on vancomycin and zosyn since 1/31 and was changed to zithromax and rocephin 2/4 as pt was improving  - given one dose of fosfomycin 2/4 to target ESBL, the therapy now completed for ESBL - transferred to SDU again 2/8, hold sedating medications - lactic acid and procalcitonin, WBC elevated so sepsis protocol re initiated, ABX coverage broadened 2/8 - today is day #4 of broad spectrum ABX vancomycin and imipenem  - clinically improving, more alert this AM, ok to transfer to tele   Active Problems:   Elevated troponin secondary to demand ischemia / acute systolic CHF - in the setting of sepsis, still rather  flat troponins, no need for further repeat - ECHO with EF 35%, lasix started per cardiology team, continue and monitor daily weights, strict I/O - COreg added 6.25 mg PO BID     Thrombocytopenia - stopped all heparin products 2/2 - SCD's for DVT prophylaxis  - Plt remain overall stable  - CBC in AM    Anasarca - diffuse volume overload  - in the setting of hypoalbuminemia and volume resuscitation in the setting of sepsis and hypotension, low EF of 35% - lasix started per cardiology, weight trending down  - continue and monitor response to Lasix   Hypothyroidism - Continue Synthroid. - TSH mildly elevated, no need to increase the dose of Synthroid at this time    Essential hypertension  - resumed Coreg    Breast cancer metastasized to bone [facility]) and liver - Under the care of Dr. [name]. Status post chemotherapy 02/24/15 followed by Neulasta support. - appreciate Dr. 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   Diabetes mellitus with complication (HCC) / hypoglycemia - Use insulin sensitive SSI for now until oral intake improves  Anemia of chronic renal failure, stage 4 (severe) (HCC) - Has required unit of blood transfusion while inpatient - no signs of active bleeding, Hg up from 8.0 --> 9.0 --> 9.9 --> 9 --> 8.2 this AM - CBC in AM   Pain from bone metastases (HCC) - holding scheduled MS continue as pt was more lethargic 2/5 - allow analgesia as needed only    DVT prophylaxis - SCD's  Family Communication/Anticipated D/C date and plan/Code Status   Family  Communication: brother at bedside, daughter over the phone  Disposition Plan: SNF by 2/13, unable to d/c at this time  Code Status: Full code.  IV Access:    Peripheral IV  Procedures and diagnostic studies:    Dg Chest 2 View 03/02/2015 Chronic RIGHT basilar atelectasis. No acute pulmonary abnormalities. Diffuse osseous metastases.  Ct Chest Wo Contrast  03/06/2015  Posterior RUL PNA, bilateral pleural effusions with bilateral lower lobe atelectasis/consolidation, multiple lung nodules, 5 mm LLL nodule unchanged.  Medical Consultants:    None.  Anti-Infectives:    Vancomycin 03/02/15---> 2/3  Zosyn 03/02/15---> 2/3  Primaxin 2/3 --> change to fosfomycin x 1 dose  Zithromax and Rocephin 2/4 --> 2/8  Vancomycin and Imipenem 2/8 -->  Subjective:   Reports feeling better this AM.   Objective:    Filed Vitals:   03/13/15 1000 03/13/15 1100 03/13/15 1200 03/13/15 1243  BP: 134/46 123/63 132/56 128/62  Pulse: 77 74 79 76  Temp:    98.1 F (36.7 C)  TempSrc:      Resp: 26 19 11 16   Height:      Weight:      SpO2: 99% 97% 97% 98%    Intake/Output Summary (Last 24 hours) at 03/13/15 1529 Last data filed at 03/13/15 1256  Gross per 24 hour  Intake    593 ml  Output   2000 ml  Net  -1407 ml   Filed Weights   03/11/15 0400 03/12/15 0500 03/13/15 0500  Weight: 80.8 kg (178 lb 2.1 oz) 81.6 kg (179 lb 14.3 oz) 79.9 kg (176 lb 2.4 oz)    Exam: Gen:  More alert this AM, NAD Cardiovascular:  Sinus rhythm, S1/S2 Respiratory:  Lungs with diminished breath sounds at bases, poor inspiratory effor  Gastrointestinal:  Abdomen soft, NT/ND, + BS Extremities:  Upper and lower extremity bilateral edema, improving overall    Data Reviewed:    Labs: Basic Metabolic Panel:  Recent Labs Lab 03/06/15 1959 03/07/15 0405  03/09/15 0504 03/10/15 0145 03/11/15 0530 03/12/15 0720 03/13/15 0500  NA 137 135  < > 139 139 142 143 144  K 5.1 4.6  < > 4.4 5.1 4.9 4.5 3.6  CL  107 106  < > 109 108 112* 112* 110  CO2 20* 19*  < > 22 17* 15* 16* 24  GLUCOSE 127* 125*  < > 106* 166* 161* 144* 129*  BUN 38* 37*  < > 31* 25* 30* 33* 29*  CREATININE 1.68* 1.65*  < > 1.07* 1.02* 1.14* 1.22* 1.06*  CALCIUM 7.3* 7.0*  < > 7.7* 7.9* 7.5* 8.0* 7.9*  MG 1.8 1.8  --  1.7 1.6* 2.0  --   --   < > = values in this interval not displayed.  CBC:  Recent Labs Lab 03/09/15 0504 03/10/15 0145 03/11/15 0530 03/12/15 0720 03/13/15 0500  WBC 8.0 15.5* 16.9* 14.3* 9.4  HGB 8.0* 9.0* 9.9* 9.0* 8.2*  HCT 26.2* 28.5* 32.7* 29.7* 27.0*  MCV  93.6 90.8 93.7 94.0 93.8  PLT 103* 124* 135* 134* 122*   Sepsis Labs:  Recent Labs Lab 03/06/15 1955  03/10/15 0145 03/10/15 0755 03/10/15 1410 03/10/15 1800 03/11/15 0530 03/12/15 0720 03/13/15 0500  PROCALCITON  --   --   --  0.39  --   --   --   --   --   WBC  --   < > 15.5*  --   --   --  16.9* 14.3* 9.4  LATICACIDVEN 2.3*  --   --  3.6* 3.3* 2.8*  --   --   --   < > = values in this interval not displayed. Microbiology No results found for this or any previous visit (from the past 240 hour(s)).   Medications:   . aspirin  81 mg Oral Daily  . atorvastatin  80 mg Oral Daily  . calcium carbonate  1 tablet Oral TID WC  . carvedilol  6.25 mg Oral BID WC  . clopidogrel  75 mg Oral Daily  . furosemide  40 mg Intravenous BID  . guaiFENesin  600 mg Oral BID  . imipenem-cilastatin  500 mg Intravenous 3 times per day  . insulin aspart  0-5 Units Subcutaneous QHS  . insulin aspart  0-9 Units Subcutaneous TID WC  . levothyroxine  150 mcg Oral QAC breakfast  . mirtazapine  7.5 mg Oral QHS  . pantoprazole (PROTONIX) IV  40 mg Intravenous Q24H  . sodium chloride flush  10-40 mL Intracatheter Q12H  . vancomycin  500 mg Intravenous Q12H   Continuous Infusions:    Time spent: 35 minutes.  The patient is medically complex with multiple co-morbidities and is at high risk for clinical deterioration and requires high complexity  decision making.   LOS: 11 days   MAGICK-Bella Brummet, [facility]  [facility] Pager [phone]. If unable to reach me by pager, please call my cell phone at [phone]  Please refer to [email or web address], password [email or web address] to get updated schedule on who will round on this patient, as hospitalists switch teams weekly. If 7PM-7AM, please contact night-coverage at [email or web address], password [email or web address] for any overnight needs.  03/13/2015, 3:29 PM

## 2015-03-14 DIAGNOSIS — A499 Bacterial infection, unspecified: Secondary | ICD-10-CM

## 2015-03-14 DIAGNOSIS — R609 Edema, unspecified: Secondary | ICD-10-CM

## 2015-03-14 DIAGNOSIS — Z1612 Extended spectrum beta lactamase (ESBL) resistance: Secondary | ICD-10-CM

## 2015-03-14 DIAGNOSIS — N039 Chronic nephritic syndrome with unspecified morphologic changes: Secondary | ICD-10-CM

## 2015-03-14 LAB — COMPREHENSIVE METABOLIC PANEL
ALT: 9 U/L — AB (ref 14–54)
ANION GAP: 8 (ref 5–15)
AST: 65 U/L — ABNORMAL HIGH (ref 15–41)
Albumin: 2.1 g/dL — ABNORMAL LOW (ref 3.5–5.0)
Alkaline Phosphatase: 135 U/L — ABNORMAL HIGH (ref 38–126)
BUN: 22 mg/dL — ABNORMAL HIGH (ref 6–20)
CHLORIDE: 107 mmol/L (ref 101–111)
CO2: 27 mmol/L (ref 22–32)
CREATININE: 0.82 mg/dL (ref 0.44–1.00)
Calcium: 7.5 mg/dL — ABNORMAL LOW (ref 8.9–10.3)
Glucose, Bld: 116 mg/dL — ABNORMAL HIGH (ref 65–99)
POTASSIUM: 3.3 mmol/L — AB (ref 3.5–5.1)
SODIUM: 142 mmol/L (ref 135–145)
Total Bilirubin: 0.7 mg/dL (ref 0.3–1.2)
Total Protein: 4.6 g/dL — ABNORMAL LOW (ref 6.5–8.1)

## 2015-03-14 LAB — CBC
HCT: 24.1 % — ABNORMAL LOW (ref 36.0–46.0)
HEMOGLOBIN: 7.7 g/dL — AB (ref 12.0–15.0)
MCH: 29.3 pg (ref 26.0–34.0)
MCHC: 32 g/dL (ref 30.0–36.0)
MCV: 91.6 fL (ref 78.0–100.0)
PLATELETS: 100 10*3/uL — AB (ref 150–400)
RBC: 2.63 MIL/uL — AB (ref 3.87–5.11)
RDW: 16.7 % — ABNORMAL HIGH (ref 11.5–15.5)
WBC: 7.6 10*3/uL (ref 4.0–10.5)

## 2015-03-14 LAB — GLUCOSE, CAPILLARY
GLUCOSE-CAPILLARY: 153 mg/dL — AB (ref 65–99)
GLUCOSE-CAPILLARY: 124 mg/dL — AB (ref 65–99)
GLUCOSE-CAPILLARY: 169 mg/dL — AB (ref 65–99)
Glucose-Capillary: 114 mg/dL — ABNORMAL HIGH (ref 65–99)

## 2015-03-14 LAB — MAGNESIUM: Magnesium: 1.2 mg/dL — ABNORMAL LOW (ref 1.7–2.4)

## 2015-03-14 MED ORDER — CARVEDILOL 12.5 MG PO TABS
12.5000 mg | ORAL_TABLET | Freq: Two times a day (BID) | ORAL | Status: DC
  Administered 2015-03-14 – 2015-03-15 (×2): 12.5 mg via ORAL
  Filled 2015-03-14 (×2): qty 1

## 2015-03-14 MED ORDER — POTASSIUM CHLORIDE 10 MEQ/100ML IV SOLN
10.0000 meq | INTRAVENOUS | Status: AC
  Administered 2015-03-14 (×3): 10 meq via INTRAVENOUS
  Filled 2015-03-14 (×3): qty 100

## 2015-03-14 MED ORDER — VITAMINS A & D EX OINT
TOPICAL_OINTMENT | CUTANEOUS | Status: AC
  Administered 2015-03-14: 5
  Filled 2015-03-14: qty 5

## 2015-03-14 MED ORDER — FUROSEMIDE 40 MG PO TABS
40.0000 mg | ORAL_TABLET | Freq: Every day | ORAL | Status: DC
  Administered 2015-03-14 – 2015-03-25 (×10): 40 mg via ORAL
  Filled 2015-03-14 (×10): qty 1

## 2015-03-14 NOTE — Progress Notes (Signed)
Patient: Brittney Cunningham / Admit Date: 03/02/2015 / Date of Encounter: 03/14/2015, 7:00 AM   Subjective: Denies dyspnea or chest pain; complains of knee pain   Objective: Telemetry: NSR with PACs, occasional runs of MAT/PAT Physical Exam: Blood pressure 110/50, pulse 94, temperature 98 F (36.7 C), temperature source Axillary, resp. rate 18, height 5\' 3"  (1.6 m), weight 171 lb 8.3 oz (77.8 kg), last menstrual period 03/13/2012, SpO2 97 %. General: Chronically ill appearing WF in NAD. Head: Normal Neck: Supple. Lungs: Diminished BS at bases  Heart: RRR  Abdomen: Soft, non-tender, non-distended; no masses Extremities:trace edema  Neuro: Alert and oriented X 3. Moves all extremities spontaneously.    Intake/Output Summary (Last 24 hours) at 03/14/15 0700 Last data filed at 03/14/15 0630  Gross per 24 hour  Intake   1780 ml  Output   1100 ml  Net    680 ml    Inpatient Medications:  . aspirin  81 mg Oral Daily  . atorvastatin  80 mg Oral Daily  . calcium carbonate  1 tablet Oral TID WC  . carvedilol  6.25 mg Oral BID WC  . clopidogrel  75 mg Oral Daily  . furosemide  40 mg Intravenous BID  . guaiFENesin  600 mg Oral BID  . imipenem-cilastatin  500 mg Intravenous 3 times per day  . insulin aspart  0-5 Units Subcutaneous QHS  . insulin aspart  0-9 Units Subcutaneous TID WC  . levothyroxine  150 mcg Oral QAC breakfast  . mirtazapine  7.5 mg Oral QHS  . pantoprazole (PROTONIX) IV  40 mg Intravenous Q24H  . sodium chloride flush  10-40 mL Intracatheter Q12H  . vancomycin  500 mg Intravenous Q12H   Infusions:    Labs:  Recent Labs  03/12/15 0720 03/13/15 0500  NA 143 144  K 4.5 3.6  CL 112* 110  CO2 16* 24  GLUCOSE 144* 129*  BUN 33* 29*  CREATININE 1.22* 1.06*  CALCIUM 8.0* 7.9*    Recent Labs  03/13/15 0500  AST 78*  ALT 14  ALKPHOS 140*  BILITOT 1.1  PROT 4.8*  ALBUMIN 2.2*    Recent Labs  03/12/15 0720 03/13/15 0500  WBC 14.3* 9.4  HGB 9.0*  8.2*  HCT 29.7* 27.0*  MCV 94.0 93.8  PLT 134* 122*    Radiology/Studies:  Dg Chest 2 View  03/02/2015  CLINICAL DATA:  Shortness of breath, cough and congestion since Sunday, history coronary artery disease, chronic renal insufficiency, coronary artery disease, hypertension, type II diabetes mellitus, metastatic breast cancer EXAM: CHEST  2 VIEW COMPARISON:  09/21/2014 FINDINGS: RIGHT subclavian Port-A-Cath with tip projecting over SVC. Normal heart size, mediastinal contours, and pulmonary vascularity. Atherosclerotic calcification aorta. Eventration of the anterior RIGHT diaphragm. Increased markings at anterior RIGHT lung base question chronic atelectasis. Remaining lungs grossly clear. No pleural effusion or pneumothorax. Diffuse osteosclerosis again identified, patchy, likely related to known osseous metastatic disease. IMPRESSION: Chronic RIGHT basilar atelectasis. No definite acute pulmonary abnormalities. Diffuse osseous metastatic disease. Electronically Signed   By: Lavonia Dana M.D.   On: 03/02/2015 12:33   Ct Chest Wo Contrast  03/06/2015  CLINICAL DATA:  Dyspnea. Dizziness, fatigue, and upper respiratory symptoms. Tachycardia, tachypnea, and fever. Metastatic right breast cancer currently undergoing chemotherapy. EXAM: CT CHEST WITHOUT CONTRAST TECHNIQUE: Multidetector CT imaging of the chest was performed following the standard protocol without IV contrast. COMPARISON:  01/12/2015 FINDINGS: There is new diffuse body wall edema and on organized subcutaneous fluid, greatest  in the right lateral chest wall and incompletely visualized. Sequelae of right mastectomy are again identified. A right subclavian Port-A-Cath remains in place and terminates at the cavoatrial junction. Diffuse aortic and coronary artery calcifications are noted. The heart is normal in size. No enlarged axillary, mediastinal, or hilar lymph nodes are identified. Small left axillary lymph nodes measure up to 9 mm in short  axis. Small mediastinal lymph nodes measure up to 8 mm in short axis. There are new, small bilateral pleural effusions with overlying dependent atelectasis and/or consolidation in the lower lobes. Emphysematous changes are greatest in the right upper lobe. There is new ground-glass and patchy consolidative opacity in the posterior right upper lobe. Small nodules along the right major fissure on the prior study are not well seen on this examination due to adjacent lung opacity and motion artifact. Subpleural reticulation is noted in the anterior right upper and right middle lobes related to prior radiation therapy. 5 mm left lower lobe nodule is unchanged (series 6, image 37). Small nodules along the left major fissure unchanged. Other small nodules seen on the prior study as well as plaque-like pleural thickening in the right lower lobe are obscured on the current examination by parenchymal consolidation/atelectasis and motion artifact. Small focus of residual opacity in the lingula on the prior study has resolved. The numerous liver lesions described on the prior study are not well seen on this noncontrast examination. Calcified atherosclerosis is noted in the upper abdomen. Sclerotic osseous metastases are again seen throughout the visualized skeleton with multiple nonacute rib fractures again noted. IMPRESSION: 1. Posterior right upper lobe opacity compatible with pneumonia. 2. Small bilateral pleural effusions with bilateral lower lobe atelectasis/consolidation. 3. Multiple previously described lung nodules are obscured on the current examination due to the above acute changes. The 5 mm left lower lobe nodule is unchanged. 4. Similar appearance of diffuse osseous metastases. Electronically Signed   By: Logan Bores M.D.   On: 03/06/2015 16:41   Dg Chest Port 1 View  03/10/2015  CLINICAL DATA:  Pneumonia EXAM: PORTABLE CHEST 1 VIEW COMPARISON:  03/06/2015 FINDINGS: Cardiac shadow is stable in appearance. A  right chest wall port is seen. Scattered patchy infiltrates are noted throughout both lungs similar to that seen on recent CT examination. Small bilateral pleural effusions right greater than left are again noted. Changes consistent with metastatic disease in the bone are again noted. IMPRESSION: No change in patchy bilateral pneumonia. Bilateral pleural effusions are seen. Stable bony metastatic disease. Electronically Signed   By: Inez Catalina M.D.   On: 03/10/2015 12:48     Assessment and Plan  70F with CAD (cath 06/2013: CTO RCA, mod-sev oCx disease - PCI of Cx would be too complex so managed medically), HTN, HLD, metastatic breast CA to bone/liver, anemia, CKD stage II, DM, carotid disease (s/p R CEA, L stenting). Admitted 03/02/2015 to [facility] with CAP and recent fall at home. Developed encephalopathy during admission felt 2/2 meds (phenergan, ativan, morphine). Also developed tachy - NSR with freq PACs versus MAT. Also noted to have weight gain and pleural effusions with 2D echo 03/10/15: EF 30-35%, akinesis of basal inferior myocardium, severe TR, mod MR, mildly dilated LA, PASP 4mmHg. (Previous echo 06/2013 EF 55-60%, mod MR.)   1. RUL PNA, ESBL UTI, and sepsis - per IM/CCM.  2. Acute systolic CHF with severe mitral regurgitation, also componennt of volume overload from hypoalbuminemia and fluid resuscitation - IV Lasix started 2/9. Volume status much improved. Change lasix  to 40 mg daily. Change coreg to 12.5 BID; We will consider an ACE inhibitor as outpt once it is clear her blood pressure and renal function stable.   3. CAD/elevated troponin - elevated trop not felt c/w ACS. Given metastatic breast cancer to bone and liver she is not felt to be a candidate for aggressive cardiac evaluation at present time. She remains on ASA + Plavix for medical therapy (although Plavix is due to h/o carotid stenting, not because of prior coronary disease). Continue statin.  4. Tachycardia - reviewed telemetry  personally; appears to be sinus tach with PACs, + PAT. Increase coreg to 12.5 BID  5. Abnormal TSH - per IM. Pt should FU with Dr Martinique following DC  Signed, Kirk Ruths

## 2015-03-14 NOTE — Progress Notes (Signed)
Patient ID: Brittney Cunningham, female   DOB: Mar 30, 1940, 75 y.o.   MRN: [id number] [facility] PROGRESS NOTE  Brittney Cunningham [id number] DOB: 1940/06/29 DOA: 03/02/2015 PCP: [name], MD  Brief narrative:    75 y.o. female with a past medical history of stage IV right breast cancer diagnosed May/2011 with metastasis to the bone and liver status post neoadjuvant therapy, right modified radical mastectomy 03/2010 and radiation treatment, under the care of Dr. [name]. She is currently treated with eribulin with last dose given 02/24/15 followed by Neulasta support. She was admitted 03/02/15 with dizziness, fatigue and upper respiratory symptoms. Upon initial presentation, she was found to be tachycardic, tachypneic with a fever of 100.8. She was admitted for possible sepsis.  Major events since admission: 2/4 - CT chest confirmed RUL PNA, zithro and rocephin started, fosfomycin x1 given for ESBL  2/8 - more lethargic this AM, transfer to SDU, broaden ABX coverage, sepsis protocol re initiated, PCCM consulted  2/9 - cardio consulted due to persistently elevated trop and EF of 35% on ECHO  2/10 - doing better, still volume overloaded, keep in SDU, advance diet soft  2/11 - weight trending down, transferred to telemetry unit   Assessment/Plan:    Principal Problem:  Sepsis de to HCAP and ESBL UTI ([place]) / Leukocytosis - Sepsis present on admission - Source of infection HCAP And ESBL UTI - Pt has been on vancomycin and zosyn since 1/31 and was changed to zithromax and rocephin 2/4 as pt was improving  - Se was given one dose of fosfomycin 2/4 to target ESBL, the therapy now completed for ESBL - Sepsis protocol re initiated, ABX coverage broadened 2/8 and she is now on vanoc and primaxin  Active Problems:  Elevated troponin secondary to demand ischemia / acute systolic CHF - Demand ischemia from sepsis - ECHO with EF 35%, lasix started per cardiology team - Improving with  diureses - Lasix now 40 mg daily  - Increased coreg to 12.5 mg BID today - Cardio following    Thrombocytopenia - We stopped all heparin products 2/2 - Using SCD's for DVT prophylaxis    Anasarca - Likely in the setting of hypoalbuminemia and volume resuscitation in the setting of sepsis and hypotension, low EF of 35% - Continue lasix    Hypothyroidism - Continue Synthroid. - TSH mildly elevated, no need to increase the dose of Synthroid at this time    Essential hypertension  - Continue coreg     Breast cancer metastasized to bone [facility]) and liver - Under the care of Dr. [name].  -  Status post chemotherapy 02/24/15 followed by Neulasta support.   Mixed collagen vascular disease (HCC)   Chronic kidney disease, stage II - III / metabolic acidosis  - Baseline Cr 1.4 - 1.7 in the past several months - Cr now WNL   Hyperkalemia / Hypokalemia  - Due to CKD - Now 3.3 likely due to hypokalemia - Supplemented    Hypomagnesemia and hypocalcemia  - Supplemented - repeat BMP and Mg in AM   CAD (coronary artery disease), native coronary artery - Continue Lipitor and aspirin/Plavix.   Diabetes mellitus with complication (HCC) / hypoglycemia - Continue SSI   Anemia of chronic renal failure, stage 4 (severe) (HCC) - Pt has required unit of blood transfusion while inpatient - Hemoglobin down to 7.7, continue to monitor daily CBC   Pain from bone metastases (HCC) - Continue pain management efforts   DVT Prophylaxis  -  SCD's bilaterally   Code Status: Full.  Family Communication:  plan of care discussed with the patient and her sister at the bedside  Disposition Plan: Home once stable possibly by 2/14  IV access:  Peripheral IV  Procedures and diagnostic studies:    No results found.  Medical Consultants:  Cardiology  Other Consultants:  Dg Chest 2 View 03/02/2015 Chronic RIGHT basilar atelectasis. No acute pulmonary abnormalities. Diffuse osseous  metastases.  Ct Chest Wo Contrast 03/06/2015 Posterior RUL PNA, bilateral pleural effusions with bilateral lower lobe atelectasis/consolidation, multiple lung nodules, 5 mm LLL nodule unchanged.  IAnti-Infectives:    Vancomycin 03/02/15---> 2/3  Zosyn 03/02/15---> 2/3  Primaxin 2/3 --> change to fosfomycin x 1 dose  Zithromax and Rocephin 2/4 --> 2/8  Vancomycin and Imipenem 2/8 -->   Leisa Lenz, MD  [facility] Pager [phone]  Time spent in minutes: 25 minutes  If 7PM-7AM, please contact night-coverage [email or web address] Password [facility] 03/14/2015, 3:50 PM   LOS: 12 days    HPI/Subjective: No acute overnight events. Patient reports feeling better.  Objective: Filed Vitals:   03/14/15 0554 03/14/15 0606 03/14/15 0750 03/14/15 1255  BP: 107/39 110/50  112/50  Pulse: 66 94  83  Temp: 98 F (36.7 C)   97.3 F (36.3 C)  TempSrc: Axillary   Oral  Resp: 16 18  20   Height:      Weight: 77.8 kg (171 lb 8.3 oz)     SpO2: 97%  94% 95%    Intake/Output Summary (Last 24 hours) at 03/14/15 1550 Last data filed at 03/14/15 1531  Gross per 24 hour  Intake   1840 ml  Output    550 ml  Net   1290 ml    Exam:   General:  Pt is alert, not in acute distress  Cardiovascular: Regular rate and rhythm, S1/S2 (+)  Respiratory: No wheezing, no crackles, no rhonchi  Abdomen: Soft, tender in mid abd, non distended, bowel sounds present  Extremities: No edema, pulses DP and PT palpable bilaterally, upper ext non pitting edema better  Neuro: Grossly nonfocal  Data Reviewed: Basic Metabolic Panel:  Recent Labs Lab 03/09/15 0504 03/10/15 0145 03/11/15 0530 03/12/15 0720 03/13/15 0500 03/14/15 0500  NA 139 139 142 143 144 142  K 4.4 5.1 4.9 4.5 3.6 3.3*  CL 109 108 112* 112* 110 107  CO2 22 17* 15* 16* 24 27  GLUCOSE 106* 166* 161* 144* 129* 116*  BUN 31* 25* 30* 33* 29* 22*  CREATININE 1.07* 1.02* 1.14* 1.22* 1.06* 0.82  CALCIUM 7.7* 7.9* 7.5* 8.0* 7.9* 7.5*   MG 1.7 1.6* 2.0  --   --  1.2*   Liver Function Tests:  Recent Labs Lab 03/11/15 0530 03/13/15 0500 03/14/15 0500  AST 89* 78* 65*  ALT 26 14 9*  ALKPHOS 147* 140* 135*  BILITOT 1.1 1.1 0.7  PROT 5.1* 4.8* 4.6*  ALBUMIN 2.1* 2.2* 2.1*   No results for input(s): LIPASE, AMYLASE in the last 168 hours. No results for input(s): AMMONIA in the last 168 hours. CBC:  Recent Labs Lab 03/10/15 0145 03/11/15 0530 03/12/15 0720 03/13/15 0500 03/14/15 0500  WBC 15.5* 16.9* 14.3* 9.4 7.6  HGB 9.0* 9.9* 9.0* 8.2* 7.7*  HCT 28.5* 32.7* 29.7* 27.0* 24.1*  MCV 90.8 93.7 94.0 93.8 91.6  PLT 124* 135* 134* 122* 100*   Cardiac Enzymes:  Recent Labs Lab 03/07/15 1650 03/10/15 0115 03/10/15 0755 03/10/15 1410  TROPONINI 0.29* 0.15* 0.16* 0.25*  BNP: Invalid input(s): POCBNP CBG:  Recent Labs Lab 03/13/15 1159 03/13/15 1729 03/13/15 2124 03/14/15 0727 03/14/15 1134  GLUCAP 135* 167* 156* 114* 153*    No results found for this or any previous visit (from the past 240 hour(s)).   Scheduled Meds: . aspirin  81 mg Oral Daily  . atorvastatin  80 mg Oral Daily  . calcium carbonate  1 tablet Oral TID WC  . carvedilol  12.5 mg Oral BID WC  . clopidogrel  75 mg Oral Daily  . furosemide  40 mg Oral Daily  . guaiFENesin  600 mg Oral BID  . imipenem-cilastatin  500 mg Intravenous 3 times per day  . insulin aspart  0-5 Units Subcutaneous QHS  . insulin aspart  0-9 Units Subcutaneous TID WC  . levothyroxine  150 mcg Oral QAC breakfast  . mirtazapine  7.5 mg Oral QHS  . pantoprazole (PROTONIX) IV  40 mg Intravenous Q24H  . vancomycin  500 mg Intravenous Q12H

## 2015-03-15 DIAGNOSIS — I248 Other forms of acute ischemic heart disease: Secondary | ICD-10-CM | POA: Diagnosis present

## 2015-03-15 DIAGNOSIS — I2510 Atherosclerotic heart disease of native coronary artery without angina pectoris: Secondary | ICD-10-CM

## 2015-03-15 DIAGNOSIS — I340 Nonrheumatic mitral (valve) insufficiency: Secondary | ICD-10-CM

## 2015-03-15 DIAGNOSIS — I471 Supraventricular tachycardia: Secondary | ICD-10-CM

## 2015-03-15 LAB — COMPREHENSIVE METABOLIC PANEL
ALBUMIN: 2.3 g/dL — AB (ref 3.5–5.0)
ALK PHOS: 141 U/L — AB (ref 38–126)
ALT: 7 U/L — AB (ref 14–54)
ANION GAP: 9 (ref 5–15)
AST: 47 U/L — ABNORMAL HIGH (ref 15–41)
BUN: 19 mg/dL (ref 6–20)
CALCIUM: 7.5 mg/dL — AB (ref 8.9–10.3)
CO2: 27 mmol/L (ref 22–32)
CREATININE: 0.76 mg/dL (ref 0.44–1.00)
Chloride: 106 mmol/L (ref 101–111)
GFR calc Af Amer: >60 mL/min (ref >60)
GFR calc non Af Amer: >60 mL/min (ref >60)
GLUCOSE: 109 mg/dL — AB (ref 65–99)
Potassium: 3.6 mmol/L (ref 3.5–5.1)
SODIUM: 142 mmol/L (ref 135–145)
Total Bilirubin: 0.9 mg/dL (ref 0.3–1.2)
Total Protein: 4.9 g/dL — ABNORMAL LOW (ref 6.5–8.1)

## 2015-03-15 LAB — GLUCOSE, CAPILLARY
GLUCOSE-CAPILLARY: 156 mg/dL — AB (ref 65–99)
GLUCOSE-CAPILLARY: 174 mg/dL — AB (ref 65–99)
Glucose-Capillary: 99 mg/dL (ref 65–99)
Glucose-Capillary: 114 mg/dL — ABNORMAL HIGH (ref 65–99)

## 2015-03-15 MED ORDER — CARVEDILOL 3.125 MG PO TABS
15.6250 mg | ORAL_TABLET | Freq: Two times a day (BID) | ORAL | Status: DC
  Administered 2015-03-15 – 2015-03-25 (×16): 15.625 mg via ORAL
  Filled 2015-03-15 (×16): qty 1

## 2015-03-15 MED ORDER — LEVOFLOXACIN IN D5W 750 MG/150ML IV SOLN
750.0000 mg | INTRAVENOUS | Status: DC
  Administered 2015-03-15: 750 mg via INTRAVENOUS
  Filled 2015-03-15 (×2): qty 150

## 2015-03-15 MED ORDER — MAGNESIUM SULFATE 2 GM/50ML IV SOLN
2.0000 g | Freq: Once | INTRAVENOUS | Status: AC
  Administered 2015-03-15: 2 g via INTRAVENOUS
  Filled 2015-03-15: qty 50

## 2015-03-15 NOTE — Progress Notes (Signed)
Patient: Brittney Cunningham / Admit Date: 03/02/2015 / Date of Encounter: 03/15/2015, 9:34 AM   Subjective: Denies dyspnea, CP or awareness of bursts of SVT.   Objective: Telemetry: NSR with frequent PACs, PAT vs MAT Physical Exam: Blood pressure 137/61, pulse 76, temperature 97.6 F (36.4 C), temperature source Oral, resp. rate 18, height 5\' 3"  (1.6 m), weight 173 lb 8 oz (78.7 kg), last menstrual period 03/13/2012, SpO2 99 %. General: Chronically ill appearing WF in NAD. Head: Normocephalic, atraumatic, sclera non-icteric, no xanthomas, nares are without discharge. Neck: Supple. JVP not elevated. Lungs: CTAB without wheezes, rales, or rhonchi. Breathing is unlabored. Heart: RRR occasional ectopy S1 S2 without murmurs, rubs, or gallops.  Abdomen: Soft, non-tender, non-distended with normoactive bowel sounds. No rebound/guarding. Extremities: No clubbing or cyanosis. No significant edema UE/LE. Distal pedal pulses are 2+ and equal bilaterally. Neuro: Alert and oriented X 3. Moves all extremities spontaneously. Psych: Responds to questions appropriately with a normal affect.   Intake/Output Summary (Last 24 hours) at 03/15/15 0934 Last data filed at 03/15/15 0545  Gross per 24 hour  Intake   2120 ml  Output   2300 ml  Net   -180 ml    Inpatient Medications:  . aspirin  81 mg Oral Daily  . atorvastatin  80 mg Oral Daily  . calcium carbonate  1 tablet Oral TID WC  . carvedilol  12.5 mg Oral BID WC  . clopidogrel  75 mg Oral Daily  . furosemide  40 mg Oral Daily  . guaiFENesin  600 mg Oral BID  . imipenem-cilastatin  500 mg Intravenous 3 times per day  . insulin aspart  0-5 Units Subcutaneous QHS  . insulin aspart  0-9 Units Subcutaneous TID WC  . levothyroxine  150 mcg Oral QAC breakfast  . mirtazapine  7.5 mg Oral QHS  . pantoprazole (PROTONIX) IV  40 mg Intravenous Q24H  . sodium chloride flush  10-40 mL Intracatheter Q12H  . vancomycin  500 mg Intravenous Q12H    Infusions:    Labs:  Recent Labs  03/14/15 0500 03/15/15 0530  NA 142 142  K 3.3* 3.6  CL 107 106  CO2 27 27  GLUCOSE 116* 109*  BUN 22* 19  CREATININE 0.82 0.76  CALCIUM 7.5* 7.5*  MG 1.2*  --     Recent Labs  03/14/15 0500 03/15/15 0530  AST 65* 47*  ALT 9* 7*  ALKPHOS 135* 141*  BILITOT 0.7 0.9  PROT 4.6* 4.9*  ALBUMIN 2.1* 2.3*    Recent Labs  03/13/15 0500 03/14/15 0500  WBC 9.4 7.6  HGB 8.2* 7.7*  HCT 27.0* 24.1*  MCV 93.8 91.6  PLT 122* 100*   No results for input(s): CKTOTAL, CKMB, TROPONINI in the last 72 hours. Invalid input(s): POCBNP No results for input(s): HGBA1C in the last 72 hours.   Radiology/Studies:  Dg Chest 2 View  03/02/2015  CLINICAL DATA:  Shortness of breath, cough and congestion since Sunday, history coronary artery disease, chronic renal insufficiency, coronary artery disease, hypertension, type II diabetes mellitus, metastatic breast cancer EXAM: CHEST  2 VIEW COMPARISON:  09/21/2014 FINDINGS: RIGHT subclavian Port-A-Cath with tip projecting over SVC. Normal heart size, mediastinal contours, and pulmonary vascularity. Atherosclerotic calcification aorta. Eventration of the anterior RIGHT diaphragm. Increased markings at anterior RIGHT lung base question chronic atelectasis. Remaining lungs grossly clear. No pleural effusion or pneumothorax. Diffuse osteosclerosis again identified, patchy, likely related to known osseous metastatic disease. IMPRESSION: Chronic RIGHT basilar atelectasis. No  definite acute pulmonary abnormalities. Diffuse osseous metastatic disease. Electronically Signed   By: Lavonia Dana M.D.   On: 03/02/2015 12:33   Ct Chest Wo Contrast  03/06/2015  CLINICAL DATA:  Dyspnea. Dizziness, fatigue, and upper respiratory symptoms. Tachycardia, tachypnea, and fever. Metastatic right breast cancer currently undergoing chemotherapy. EXAM: CT CHEST WITHOUT CONTRAST TECHNIQUE: Multidetector CT imaging of the chest was performed  following the standard protocol without IV contrast. COMPARISON:  01/12/2015 FINDINGS: There is new diffuse body wall edema and on organized subcutaneous fluid, greatest in the right lateral chest wall and incompletely visualized. Sequelae of right mastectomy are again identified. A right subclavian Port-A-Cath remains in place and terminates at the cavoatrial junction. Diffuse aortic and coronary artery calcifications are noted. The heart is normal in size. No enlarged axillary, mediastinal, or hilar lymph nodes are identified. Small left axillary lymph nodes measure up to 9 mm in short axis. Small mediastinal lymph nodes measure up to 8 mm in short axis. There are new, small bilateral pleural effusions with overlying dependent atelectasis and/or consolidation in the lower lobes. Emphysematous changes are greatest in the right upper lobe. There is new ground-glass and patchy consolidative opacity in the posterior right upper lobe. Small nodules along the right major fissure on the prior study are not well seen on this examination due to adjacent lung opacity and motion artifact. Subpleural reticulation is noted in the anterior right upper and right middle lobes related to prior radiation therapy. 5 mm left lower lobe nodule is unchanged (series 6, image 37). Small nodules along the left major fissure unchanged. Other small nodules seen on the prior study as well as plaque-like pleural thickening in the right lower lobe are obscured on the current examination by parenchymal consolidation/atelectasis and motion artifact. Small focus of residual opacity in the lingula on the prior study has resolved. The numerous liver lesions described on the prior study are not well seen on this noncontrast examination. Calcified atherosclerosis is noted in the upper abdomen. Sclerotic osseous metastases are again seen throughout the visualized skeleton with multiple nonacute rib fractures again noted. IMPRESSION: 1. Posterior right  upper lobe opacity compatible with pneumonia. 2. Small bilateral pleural effusions with bilateral lower lobe atelectasis/consolidation. 3. Multiple previously described lung nodules are obscured on the current examination due to the above acute changes. The 5 mm left lower lobe nodule is unchanged. 4. Similar appearance of diffuse osseous metastases. Electronically Signed   By: Logan Bores M.D.   On: 03/06/2015 16:41   Dg Chest Port 1 View  03/10/2015  CLINICAL DATA:  Pneumonia EXAM: PORTABLE CHEST 1 VIEW COMPARISON:  03/06/2015 FINDINGS: Cardiac shadow is stable in appearance. A right chest wall port is seen. Scattered patchy infiltrates are noted throughout both lungs similar to that seen on recent CT examination. Small bilateral pleural effusions right greater than left are again noted. Changes consistent with metastatic disease in the bone are again noted. IMPRESSION: No change in patchy bilateral pneumonia. Bilateral pleural effusions are seen. Stable bony metastatic disease. Electronically Signed   By: Inez Catalina M.D.   On: 03/10/2015 12:48     Assessment and Plan  5F with CAD (cath 06/2013: CTO RCA, mod-sev oCx disease - PCI of Cx would be too complex so managed medically), HTN, HLD, metastatic breast CA to cone, anemia, CKD stage II, DM, carotid disease (s/p R CEA, L stenting). Admitted 03/02/2015 to [facility] with CAP and recent fall at home. Developed encephalopathy during admission felt 2/2 meds (phenergan,  ativan, morphine). Also developed tachy - NSR with freq PACs versus MAT. Also noted to have weight gain and pleural effusions with 2D echo 03/10/15: EF 30-35%, akinesis of basal inferior myocardium, severe TR, mod MR, mildly dilated LA, PASP 65mmHg. (Previous echo 06/2013 EF 55-60%, mod MR.)  1. RUL PNA, ESBL UTI, and sepsis - per IM/CCM.  2. Acute systolic CHF with severe mitral regurgitation, also component of volume overload from hypoalbuminemia and fluid resuscitation - improved s/p IV Lasix,  now on oral Lasix. See below re: BB. Can consider adding ACEI as outpatient -will hold off given recent AKI and hypotension.  3. CAD/elevated troponin - elevated trop not felt c/w ACS but instead possible demand ischemia. Given metastatic breast cancer to bone and liver she is not felt to be a candidate for aggressive cardiac evaluation at present time. She remains on ASA + Plavix for medical therapy (although Plavix is due to h/o carotid stenting, not because of prior coronary disease). Continue statin - can f/u LFTs as outpatient since they have been normalizing.  4. Tachycardia - appears to be sinus tach with PACs, + MAT/PAT. Continue BB. She continues to have frequent runs of PAT/MAT but is asymptomatic. Will review possible further titration of BB with cardiology MD.  5. Abnormal TSH - per IM.  6. CKD stage II-III with hyperkalemia/hypomagnesemia/hypocalcemia - lytes are being managed by IM. Their note yesterday indicates plan to supplement magnesium but do not see this ordered. Mg was still 1.2 yesterday. Will defer further to IM but would consider repletion if not already done so. I have written a care order for nurse to d/w primary team.  7. Anemia/thrombocytopenia - per IM.  Signed, Melina Copa PA-C Pager: [phone]

## 2015-03-15 NOTE — Progress Notes (Signed)
Patient ID: Brittney Cunningham, female   DOB: 1940-04-27, 75 y.o.   MRN: [id number] [facility] PROGRESS NOTE  ZILAH KRYSIAK [id number] DOB: 06/24/1940 DOA: 03/02/2015 PCP: [name], MD  Brief narrative:    75 y.o. female with a past medical history of stage IV right breast cancer diagnosed May/2011 with metastasis to the bone and liver status post neoadjuvant therapy, right modified radical mastectomy 03/2010 and radiation treatment, under the care of Dr. [name]. She is currently treated with eribulin with last dose given 02/24/15 followed by Neulasta support. She was admitted 03/02/15 with dizziness, fatigue and upper respiratory symptoms. Upon initial presentation, she was found to be tachycardic, tachypneic with a fever of 100.8. She was admitted for possible sepsis.  Major events since admission: 2/4 - CT chest confirmed RUL PNA, zithro and rocephin started, fosfomycin x1 given for ESBL  2/8 - more lethargic this AM, transfer to SDU, broaden ABX coverage, sepsis protocol re initiated, PCCM consulted  2/9 - cardio consulted due to persistently elevated trop and EF of 35% on ECHO  2/10 - doing better, still volume overloaded, keep in SDU, advance diet soft  2/11 - weight trending down, transferred to telemetry unit   Assessment/Plan:    Principal Problem:  Sepsis de to HCAP and ESBL UTI ([place]) / Leukocytosis - Sepsis present on admission with source of infection HCAp adn ESBL UTI - Source of infection HCAP And ESBL UTI - Pt took vancomycin and zosyn since 1/31 and this was changed to zithromax and rocephin 2/4 as pt was improving  - Pt was given one dose of fosfomycin 2/4 to target ESBL, the therapy is completed for ESBL - Sepsis protocol re initiated, ABX coverage broadened 2/8 and patient currently on Primaxin and vancomycin. We will change antibiotic to Levaquin starting today because white blood cell count is normal and there is no fever.  Active Problems:   Elevated troponin secondary to demand ischemia / acute systolic CHF - Demand ischemia from sepsis - ECHO with EF 35%, lasix started per cardiology team - Lasix now 40 mg daily  - Continue Coreg 12.5 mg twice daily - Appreciate cardiology recommendations   Thrombocytopenia - We stopped all heparin products 2/2   Anasarca - Likely in the setting of hypoalbuminemia and volume resuscitation in the setting of sepsis and hypotension, low EF of 35% - Continue lasix per cardiology recommendations - Swelling improved   Hypothyroidism - Continue Syn SCDs for DVT prophylaxisthroid. - TSH mildly elevated, no need to increase the dose of Synthroid at this time    Essential hypertension  - Continue coreg 12.5 mg BID   Breast cancer metastasized to bone (HCC) and liver - Under the care of Dr. [name].  - Status post chemotherapy 02/24/15 followed by Neulasta support.   Mixed collagen vascular disease (HCC)   Chronic kidney disease, stage II - III / metabolic acidosis  - Baseline Cr 1.4 - 1.7 in the past several months - Creatinine remains within normal limits   Hyperkalemia / Hypokalemia  - Due to CKD - Now within normal limits   Hypomagnesemia and hypocalcemia  - Supplemented  - Magnesium, BMP tomorrow morning   CAD (coronary artery disease), native coronary artery - Continue Lipitor and aspirin/Plavix.   Diabetes mellitus with complication (HCC) / hypoglycemia - Continue SSI - CBGs in past 24 hours: 169, 99, 156   Anemia of chronic renal failure, stage 4 (severe) (HCC) - Pt has required unit of blood transfusion while inpatient -  Hemoglobin down to 7.7 - Check CBC tomorrow morning    Pain from bone metastases (HCC) - Pain control  DVT Prophylaxis  - SCD's bilaterally in hospital  Code Status: Full.  Family Communication:  plan of care discussed with the patient and her sister at the bedside  Disposition Plan: Home versus skilled nursing facility likely 5  03/17/2015  IV access:  Peripheral IV  Procedures and diagnostic studies:    No results found.  Medical Consultants:  Cardiology  Other Consultants:  Dg Chest 2 View 03/02/2015 Chronic RIGHT basilar atelectasis. No acute pulmonary abnormalities. Diffuse osseous metastases.  Ct Chest Wo Contrast 03/06/2015 Posterior RUL PNA, bilateral pleural effusions with bilateral lower lobe atelectasis/consolidation, multiple lung nodules, 5 mm LLL nodule unchanged.  IAnti-Infectives:    Vancomycin 03/02/15---> 2/3  Zosyn 03/02/15---> 2/3  Primaxin 2/3 --> change to fosfomycin x 1 dose  Zithromax and Rocephin 2/4 --> 2/8  Vancomycin and Imipenem 2/8 --> 03/15/2015  Levaquin 03/15/2015 -->   Rishit Burkhalter, MD  [facility] Pager [phone]  Time spent in minutes: 25 minutes  If 7PM-7AM, please contact night-coverage [email or web address] Password [facility] 03/15/2015, 12:15 PM   LOS: 13 days    HPI/Subjective: No acute overnight events. Patient reports she feels weak this morning  Objective: Filed Vitals:   03/14/15 1255 03/14/15 2159 03/15/15 0500 03/15/15 1027  BP: 112/50 106/79 137/61   Pulse: 83 79 76   Temp: 97.3 F (36.3 C) 97.7 F (36.5 C) 97.6 F (36.4 C)   TempSrc: Oral Oral Oral   Resp: 20 20 18    Height:      Weight:   78.7 kg (173 lb 8 oz)   SpO2: 95% 98% 99% 95%    Intake/Output Summary (Last 24 hours) at 03/15/15 1215 Last data filed at 03/15/15 1027  Gross per 24 hour  Intake   1920 ml  Output   2450 ml  Net   -530 ml    Exam:   General:  Pt is not in dsitress  Cardiovascular: RRR, S1/S2 appreciated   Respiratory: Diminished, no wheezing  Abdomen: Appreciate bowel sounds, nontender  Extremities: Edema much better, maybe trace in lower extremities, pulses palpable bilaterally  Neuro: Nonfocal  Data Reviewed: Basic Metabolic Panel:  Recent Labs Lab 03/09/15 0504 03/10/15 0145 03/11/15 0530 03/12/15 0720 03/13/15 0500 03/14/15 0500  03/15/15 0530  NA 139 139 142 143 144 142 142  K 4.4 5.1 4.9 4.5 3.6 3.3* 3.6  CL 109 108 112* 112* 110 107 106  CO2 22 17* 15* 16* 24 27 27   GLUCOSE 106* 166* 161* 144* 129* 116* 109*  BUN 31* 25* 30* 33* 29* 22* 19  CREATININE 1.07* 1.02* 1.14* 1.22* 1.06* 0.82 0.76  CALCIUM 7.7* 7.9* 7.5* 8.0* 7.9* 7.5* 7.5*  MG 1.7 1.6* 2.0  --   --  1.2*  --    Liver Function Tests:  Recent Labs Lab 03/11/15 0530 03/13/15 0500 03/14/15 0500 03/15/15 0530  AST 89* 78* 65* 47*  ALT 26 14 9* 7*  ALKPHOS 147* 140* 135* 141*  BILITOT 1.1 1.1 0.7 0.9  PROT 5.1* 4.8* 4.6* 4.9*  ALBUMIN 2.1* 2.2* 2.1* 2.3*   No results for input(s): LIPASE, AMYLASE in the last 168 hours. No results for input(s): AMMONIA in the last 168 hours. CBC:  Recent Labs Lab 03/10/15 0145 03/11/15 0530 03/12/15 0720 03/13/15 0500 03/14/15 0500  WBC 15.5* 16.9* 14.3* 9.4 7.6  HGB 9.0* 9.9* 9.0* 8.2* 7.7*  HCT 28.5*  32.7* 29.7* 27.0* 24.1*  MCV 90.8 93.7 94.0 93.8 91.6  PLT 124* 135* 134* 122* 100*   Cardiac Enzymes:  Recent Labs Lab 03/10/15 0115 03/10/15 0755 03/10/15 1410  TROPONINI 0.15* 0.16* 0.25*   BNP: Invalid input(s): POCBNP CBG:  Recent Labs Lab 03/14/15 1134 03/14/15 1628 03/14/15 2139 03/15/15 0729 03/15/15 1151  GLUCAP 153* 124* 169* 99 156*    No results found for this or any previous visit (from the past 240 hour(s)).   Scheduled Meds: . aspirin  81 mg Oral Daily  . atorvastatin  80 mg Oral Daily  . calcium carbonate  1 tablet Oral TID WC  . carvedilol  12.5 mg Oral BID WC  . clopidogrel  75 mg Oral Daily  . furosemide  40 mg Oral Daily  . guaiFENesin  600 mg Oral BID  . imipenem-cilastatin  500 mg Intravenous 3 times per day  . insulin aspart  0-5 Units Subcutaneous QHS  . insulin aspart  0-9 Units Subcutaneous TID WC  . levothyroxine  150 mcg Oral QAC breakfast  . mirtazapine  7.5 mg Oral QHS  . pantoprazole (PROTONIX) IV  40 mg Intravenous Q24H  . vancomycin  500  mg Intravenous Q12H

## 2015-03-15 NOTE — Care Management Note (Signed)
Case Management Note  Patient Details  Name: Brittney Cunningham MRN: [id number] Date of Birth: 1941-01-19  Subjective/Objective:  Cardio following. CHF-iv lasix. D/c in am SNF-CSW following.                  Action/Plan:d/c SNF.   Expected Discharge Date:                  Expected Discharge Plan:  Skilled Nursing Facility  In-House Referral:  NA, Clinical Social Work  Discharge planning Services  CM Consult  Post Acute Care Choice:  NA Choice offered to:  NA  DME Arranged:    DME Agency:     HH Arranged:    [place] Agency:     Status of Service:  In process, will continue to follow  Medicare Important Message Given:  Yes Date Medicare IM Given:    Medicare IM give by:    Date Additional Medicare IM Given:    Additional Medicare Important Message give by:     If discussed at [place] of Stay Meetings, dates discussed:    Additional Comments:  Dessa Phi, RN 03/15/2015, 12:08 PM

## 2015-03-15 NOTE — Progress Notes (Signed)
Pharmacy Antibiotic Note  Brittney Cunningham is a 75 y.o. female admitted on 03/02/2015 with pneumonia and UTI.  Currently on day 14 antibiotics, Day 6 vancomycin and Primaxin.  She has been on antibiotics since admission for PNA and ESBL Ecoli UTI, narrowed on 2/4, and rebroadened on 2/8.  To narrow to Levaquin today per pharmacy.  Today, 03/15/2015: Temp: afebrile since 1/31 WBC: returned to wnl as of 2/10 Renal: AKI resolved to baseline; CrCl 63 CG  Plan:  Levaquin 750 mg IV q24 hr  F/u LOT; would limit Levaquin to no more than 4 days (would be 10 days total since restarting broad-spectrum abx)  Height: 5\' 3"  (160 cm) Weight: 173 lb 8 oz (78.7 kg) IBW/kg (Calculated) : 52.4  Temp (24hrs), Avg:97.7 F (36.5 C), Min:97.6 F (36.4 C), Max:97.9 F (36.6 C)   Recent Labs Lab 03/10/15 0145 03/10/15 0755 03/10/15 1410 03/10/15 1800 03/11/15 0530 03/12/15 0720 03/12/15 2210 03/13/15 0500 03/14/15 0500 03/15/15 0530  WBC 15.5*  --   --   --  16.9* 14.3*  --  9.4 7.6  --   CREATININE 1.02*  --   --   --  1.14* 1.22*  --  1.06* 0.82 0.76  LATICACIDVEN  --  3.6* 3.3* 2.8*  --   --   --   --   --   --   VANCOTROUGH  --   --   --   --   --   --  20  --   --   --     Estimated Creatinine Clearance: 63.1 mL/min (by C-G formula based on Cr of 0.76).    Allergies  Allergen Reactions  . Chlorhexidine Hives, Itching and Rash    This was most likely a CONTACT DERMATITIS versus true systemic allergic reaction  . Adhesive [Tape] Hives  . Codeine Swelling    Antimicrobials this admission: Vanc 1/31>> 2/3; 2/8>> 2/13 Zosyn 1/31>> 2/3 Primaxin 2/3 >> 2/4; 2/8>> 2/13 2/4 Fosfomycin 3g x 1 CTX 2/4>>2/8 Zith 2/4>>2/8 Levaquin 2/13 >>  Dose adjustments this admission: 2/10 VT at 2300 = 20 on 750mg  q12. Decreased Vanc to 500 q12h  Microbiology results: 1/31BCx: NGF 1/31 UCx: 40k E.coli (+) ESBL S-Primaxin 1/31 Resp virus panel: neg 1/31 MRSA PCR: negative 1/31 Influenza PCR:  negative  Thank you for allowing pharmacy to be a part of this patient's care.  Reuel Boom, PharmD, BCPS Pager: [phone] 03/15/2015, 1:21 PM

## 2015-03-15 NOTE — Clinical Social Work Placement (Signed)
Patient has a bed at [facility]. CSW has completed FL2 & will continue to follow and assist with discharge when ready.    Raynaldo Opitz, [facility] Clinical Social Worker cell #: [phone]     CLINICAL SOCIAL WORK PLACEMENT  NOTE  Date:  03/15/2015  Patient Details  Name: Brittney Cunningham MRN: [id number] Date of Birth: 08/08/1940  Clinical Social Work is seeking post-discharge placement for this patient at the [place] level of care (*CSW will initial, date and re-position this form in  chart as items are completed):  Yes   Patient/family provided with [place] Work Department's list of facilities offering this level of care within the geographic area requested by the patient (or if unable, by the patient's family).  Yes   Patient/family informed of their freedom to choose among providers that offer the needed level of care, that participate in Medicare, Medicaid or managed care program needed by the patient, have an available bed and are willing to accept the patient.  Yes   Patient/family informed of [facility]'s ownership interest in [facility] and [facility], as well as of the fact that they are under no obligation to receive care at these facilities.  PASRR submitted to EDS on       PASRR number received on       Existing PASRR number confirmed on 03/08/15     FL2 transmitted to all facilities in geographic area requested by pt/family on 03/08/15     FL2 transmitted to all facilities within larger geographic area on 03/08/15     Patient informed that his/her managed care company has contracts with or will negotiate with certain facilities, including the following:        Yes   Patient/family informed of bed offers received.  Patient chooses bed at [facility]     Physician recommends and patient chooses bed at      Patient to be transferred to [facility] on  .  Patient to be  transferred to facility by       Patient family notified on   of transfer.  Name of family member notified:        PHYSICIAN       Additional Comment:    _______________________________________________ Standley Brooking, LCSW 03/15/2015, 9:20 AM

## 2015-03-15 NOTE — Progress Notes (Signed)
COURTESY VISIT:  Recent events noted.  No plans for chemo until Shawnice is outpatient and stable.  Appreciate your help to this patient and her family!

## 2015-03-16 ENCOUNTER — Inpatient Hospital Stay (HOSPITAL_COMMUNITY): Payer: Medicare Other

## 2015-03-16 ENCOUNTER — Encounter (HOSPITAL_COMMUNITY): Payer: Self-pay | Admitting: Radiology

## 2015-03-16 ENCOUNTER — Inpatient Hospital Stay (HOSPITAL_COMMUNITY): Payer: Medicare Other | Admitting: Anesthesiology

## 2015-03-16 ENCOUNTER — Inpatient Hospital Stay (HOSPITAL_COMMUNITY)
Admit: 2015-03-16 | Discharge: 2015-03-16 | Disposition: A | Discharge status: Home / Self Care | Payer: Medicare Other | Attending: Pulmonary Disease | Admitting: Pulmonary Disease

## 2015-03-16 DIAGNOSIS — I10 Essential (primary) hypertension: Secondary | ICD-10-CM

## 2015-03-16 DIAGNOSIS — G9340 Encephalopathy, unspecified: Secondary | ICD-10-CM | POA: Insufficient documentation

## 2015-03-16 DIAGNOSIS — J9600 Acute respiratory failure, unspecified whether with hypoxia or hypercapnia: Secondary | ICD-10-CM | POA: Insufficient documentation

## 2015-03-16 DIAGNOSIS — R569 Unspecified convulsions: Secondary | ICD-10-CM

## 2015-03-16 DIAGNOSIS — D631 Anemia in chronic kidney disease: Secondary | ICD-10-CM

## 2015-03-16 DIAGNOSIS — A419 Sepsis, unspecified organism: Principal | ICD-10-CM

## 2015-03-16 DIAGNOSIS — N184 Chronic kidney disease, stage 4 (severe): Secondary | ICD-10-CM

## 2015-03-16 DIAGNOSIS — I469 Cardiac arrest, cause unspecified: Secondary | ICD-10-CM

## 2015-03-16 LAB — BLOOD GAS, ARTERIAL
Acid-base deficit: 0.9 mmol/L (ref 0.0–2.0)
BICARBONATE: 24.4 meq/L — AB (ref 20.0–24.0)
Drawn by: 295031
FIO2: 1
LHR: 14 {breaths}/min
O2 SAT: 99.5 %
PATIENT TEMPERATURE: 37
PCO2 ART: 46.7 mmHg — AB (ref 35.0–45.0)
PEEP: 5 cmH2O
PH ART: 7.338 — AB (ref 7.350–7.450)
TCO2: 23.5 mmol/L (ref 0–100)
VT: 400 mL
pO2, Arterial: 436 mmHg — ABNORMAL HIGH (ref 80.0–100.0)

## 2015-03-16 LAB — COMPREHENSIVE METABOLIC PANEL
ALBUMIN: 2.3 g/dL — AB (ref 3.5–5.0)
ALT: 8 U/L — ABNORMAL LOW (ref 14–54)
AST: 58 U/L — AB (ref 15–41)
Alkaline Phosphatase: 150 U/L — ABNORMAL HIGH (ref 38–126)
Anion gap: 17 — ABNORMAL HIGH (ref 5–15)
BUN: 20 mg/dL (ref 6–20)
CHLORIDE: 104 mmol/L (ref 101–111)
CO2: 21 mmol/L — AB (ref 22–32)
Calcium: 7.6 mg/dL — ABNORMAL LOW (ref 8.9–10.3)
Creatinine, Ser: 1.15 mg/dL — ABNORMAL HIGH (ref 0.44–1.00)
GFR calc Af Amer: 54 mL/min — ABNORMAL LOW (ref >60)
GFR calc non Af Amer: 46 mL/min — ABNORMAL LOW (ref >60)
GLUCOSE: 198 mg/dL — AB (ref 65–99)
POTASSIUM: 4.7 mmol/L (ref 3.5–5.1)
Sodium: 142 mmol/L (ref 135–145)
Total Bilirubin: 0.9 mg/dL (ref 0.3–1.2)
Total Protein: 5.3 g/dL — ABNORMAL LOW (ref 6.5–8.1)

## 2015-03-16 LAB — TROPONIN I: Troponin I: 0.12 ng/mL — ABNORMAL HIGH (ref <0.031)

## 2015-03-16 LAB — CBC WITH DIFFERENTIAL/PLATELET
BASOS ABS: 0 10*3/uL (ref 0.0–0.1)
Basophils Relative: 0 %
EOS ABS: 0 10*3/uL (ref 0.0–0.7)
Eosinophils Relative: 0 %
HCT: 28.6 % — ABNORMAL LOW (ref 36.0–46.0)
Hemoglobin: 8.6 g/dL — ABNORMAL LOW (ref 12.0–15.0)
LYMPHS ABS: 6 10*3/uL — AB (ref 0.7–4.0)
Lymphocytes Relative: 36 %
MCH: 28.8 pg (ref 26.0–34.0)
MCHC: 30.1 g/dL (ref 30.0–36.0)
MCV: 95.7 fL (ref 78.0–100.0)
MONO ABS: 1 10*3/uL (ref 0.1–1.0)
Monocytes Relative: 6 %
Neutro Abs: 9.7 10*3/uL — ABNORMAL HIGH (ref 1.7–7.7)
Neutrophils Relative %: 58 %
PLATELETS: 151 10*3/uL (ref 150–400)
RBC: 2.99 MIL/uL — AB (ref 3.87–5.11)
RDW: 17 % — AB (ref 11.5–15.5)
WBC: 16.7 10*3/uL — AB (ref 4.0–10.5)

## 2015-03-16 LAB — CBC
HCT: 27.2 % — ABNORMAL LOW (ref 36.0–46.0)
Hemoglobin: 8.4 g/dL — ABNORMAL LOW (ref 12.0–15.0)
MCH: 28.7 pg (ref 26.0–34.0)
MCHC: 30.9 g/dL (ref 30.0–36.0)
MCV: 92.8 fL (ref 78.0–100.0)
PLATELETS: 141 10*3/uL — AB (ref 150–400)
RBC: 2.93 MIL/uL — AB (ref 3.87–5.11)
RDW: 16.7 % — ABNORMAL HIGH (ref 11.5–15.5)
WBC: 9.2 10*3/uL (ref 4.0–10.5)

## 2015-03-16 LAB — GLUCOSE, CAPILLARY
GLUCOSE-CAPILLARY: 138 mg/dL — AB (ref 65–99)
GLUCOSE-CAPILLARY: 166 mg/dL — AB (ref 65–99)
GLUCOSE-CAPILLARY: 115 mg/dL — AB (ref 65–99)
GLUCOSE-CAPILLARY: 127 mg/dL — AB (ref 65–99)
Glucose-Capillary: 120 mg/dL — ABNORMAL HIGH (ref 65–99)

## 2015-03-16 LAB — URINALYSIS, ROUTINE W REFLEX MICROSCOPIC
GLUCOSE, UA: NEGATIVE mg/dL
HGB URINE DIPSTICK: NEGATIVE
Ketones, ur: NEGATIVE mg/dL
Leukocytes, UA: NEGATIVE
Nitrite: NEGATIVE
PH: 5 (ref 5.0–8.0)
Protein, ur: NEGATIVE mg/dL
SPECIFIC GRAVITY, URINE: 1.017 (ref 1.005–1.030)

## 2015-03-16 LAB — MAGNESIUM: Magnesium: 1.5 mg/dL — ABNORMAL LOW (ref 1.7–2.4)

## 2015-03-16 LAB — BASIC METABOLIC PANEL
Anion gap: 16 — ABNORMAL HIGH (ref 5–15)
BUN: 19 mg/dL (ref 6–20)
CALCIUM: 7.9 mg/dL — AB (ref 8.9–10.3)
CO2: 25 mmol/L (ref 22–32)
CREATININE: 0.97 mg/dL (ref 0.44–1.00)
Chloride: 103 mmol/L (ref 101–111)
GFR calc non Af Amer: 57 mL/min — ABNORMAL LOW (ref >60)
GLUCOSE: 147 mg/dL — AB (ref 65–99)
Potassium: 4 mmol/L (ref 3.5–5.1)
Sodium: 144 mmol/L (ref 135–145)

## 2015-03-16 LAB — LACTIC ACID, PLASMA
LACTIC ACID, VENOUS: 2.4 mmol/L — AB (ref 0.5–2.0)
Lactic Acid, Venous: 7.2 mmol/L (ref 0.5–2.0)

## 2015-03-16 LAB — PROTIME-INR
INR: 1.35 (ref 0.00–1.49)
Prothrombin Time: 16.8 seconds — ABNORMAL HIGH (ref 11.6–15.2)

## 2015-03-16 MED ORDER — LEVOFLOXACIN IN D5W 750 MG/150ML IV SOLN: 750.0000 mg | INTRAVENOUS | Status: DC

## 2015-03-16 MED ORDER — SODIUM CHLORIDE 0.9 % IV BOLUS (SEPSIS)
1000.0000 mL | Freq: Once | INTRAVENOUS | Status: AC
  Administered 2015-03-16: 1000 mL via INTRAVENOUS

## 2015-03-16 MED ORDER — INSULIN ASPART 100 UNIT/ML ~~LOC~~ SOLN
0.0000 [IU] | SUBCUTANEOUS | Status: DC
  Administered 2015-03-16 – 2015-03-17 (×2): 2 [IU] via SUBCUTANEOUS

## 2015-03-16 MED ORDER — SUCCINYLCHOLINE CHLORIDE 20 MG/ML IJ SOLN
INTRAMUSCULAR | Status: DC | PRN
  Administered 2015-03-16: 80 mg via INTRAVENOUS

## 2015-03-16 MED ORDER — MIDAZOLAM HCL 2 MG/2ML IJ SOLN
INTRAMUSCULAR | Status: AC
Start: 2015-03-16 — End: 2015-03-16
  Filled 2015-03-16: qty 2

## 2015-03-16 MED ORDER — IOHEXOL 350 MG/ML SOLN
100.0000 mL | Freq: Once | INTRAVENOUS | Status: AC | PRN
  Administered 2015-03-16: 100 mL via INTRAVENOUS

## 2015-03-16 MED ORDER — SODIUM CHLORIDE 0.9 % IV SOLN
25.0000 ug/h | INTRAVENOUS | Status: DC
  Administered 2015-03-16: 75 ug/h via INTRAVENOUS
  Filled 2015-03-16: qty 50

## 2015-03-16 MED ORDER — SODIUM CHLORIDE 0.9 % IV SOLN
1000.0000 mg | Freq: Two times a day (BID) | INTRAVENOUS | Status: DC
  Administered 2015-03-16 – 2015-03-21 (×10): 1000 mg via INTRAVENOUS
  Filled 2015-03-16 (×11): qty 10

## 2015-03-16 MED ORDER — SODIUM CHLORIDE 0.9 % IV SOLN
1.0000 mg/h | INTRAVENOUS | Status: DC
  Administered 2015-03-16: 1 mg/h via INTRAVENOUS
  Filled 2015-03-16: qty 10

## 2015-03-16 MED ORDER — ETOMIDATE 2 MG/ML IV SOLN
INTRAVENOUS | Status: AC
  Filled 2015-03-16: qty 10

## 2015-03-16 MED ORDER — ETOMIDATE 2 MG/ML IV SOLN
INTRAVENOUS | Status: DC | PRN
  Administered 2015-03-16: 8 mg via INTRAVENOUS

## 2015-03-16 MED ORDER — MAGNESIUM SULFATE 2 GM/50ML IV SOLN
2.0000 g | Freq: Once | INTRAVENOUS | Status: AC
  Administered 2015-03-16: 2 g via INTRAVENOUS
  Filled 2015-03-16: qty 50

## 2015-03-16 MED ORDER — SODIUM CHLORIDE 0.9 % IV SOLN
25.0000 ug/h | INTRAVENOUS | Status: DC
  Administered 2015-03-17: 150 ug/h via INTRAVENOUS
  Filled 2015-03-16 (×2): qty 50

## 2015-03-16 MED ORDER — SODIUM CHLORIDE 0.9 % IV SOLN
INTRAVENOUS | Status: DC
  Administered 2015-03-16 (×2): via INTRAVENOUS

## 2015-03-16 MED FILL — Medication: Qty: 1 | Status: AC

## 2015-03-16 NOTE — Progress Notes (Signed)
Offsite EEG completed, results pending. 

## 2015-03-16 NOTE — Progress Notes (Signed)
PULMONARY / CRITICAL CARE MEDICINE   Name: Brittney Cunningham MRN: [id number] DOB: [birth year]    ADMISSION DATE:  03/02/2015 CONSULTATION DATE:  2/14 (re-consult)  REFERRING MD:  Dr. Charlies Silvers   CHIEF COMPLAINT:  Suspected respiratory leading to cardiac arrest     SUBJECTIVE: Per RN, on 2/14 patient started having seizure-like activity and became unresponsive and pulseless. Code Blue ws initiated and EDP presented to bedside with CPR already in process. Patient achieved ROSC following one dose of Epi and was subsequently intubated due to mental status and hypoxia. PCCM was consulted and patient was transferred to the ICU for further evaluation.  VITAL SIGNS: BP 131/50 mmHg  Pulse 25  Temp(Src) 98.2 F (36.8 C) (Oral)  Resp 18  Ht 5\' 3"  (1.6 m)  Wt 170 lb 13.7 oz (77.5 kg)  BMI 30.27 kg/m2  SpO2 67%  LMP 03/13/2012  HEMODYNAMICS:    VENTILATOR SETTINGS: Vent Mode:  [-] PRVC FiO2 (%):  [50 %-100 %] 50 % Set Rate:  [14 bmp] 14 bmp Vt Set:  [400 mL] 400 mL PEEP:  [5 cmH20] 5 cmH20 Plateau Pressure:  [17 cmH20] 17 cmH20  INTAKE / OUTPUT: I/O last 3 completed shifts: In: 71 [P.O.:1200; I.V.:10; IV Piggyback:450] Out: 3202 [Urine:3200; Stool:2]  PHYSICAL EXAMINATION: General: Chronically ill appearing female, intubated, NAD  Neuro: Opens eyes to verbal stimuli, nods appropriately. Able to follow simple commands.  HEENT: ETT in place. Dry mucous membranes.  Cardiovascular: RRR, no murmurs  Lungs: Clear and equal bilaterally.  Abdomen: Soft, non-tender, non-distended. BS present.  Musculoskeletal: No gross deformity. Skin: No rashes, abrasion over sternum from CPR, generalized edema  LABS:  BMET  Recent Labs Lab 03/15/15 0530 03/16/15 0535 03/16/15 0825  NA 142 144 142  K 3.6 4.0 4.7  CL 106 103 104  CO2 27 25 21*  BUN 19 19 20   CREATININE 0.76 0.97 1.15*  GLUCOSE 109* 147* 198*    Electrolytes  Recent Labs Lab 03/11/15 0530  03/14/15 0500  03/15/15 0530 03/16/15 0535 03/16/15 0825  CALCIUM 7.5*  < > 7.5* 7.5* 7.9* 7.6*  MG 2.0  --  1.2*  --  1.5*  --   < > = values in this interval not displayed.  CBC  Recent Labs Lab 03/14/15 0500 03/16/15 0535 03/16/15 0825  WBC 7.6 9.2 16.7*  HGB 7.7* 8.4* 8.6*  HCT 24.1* 27.2* 28.6*  PLT 100* 141* 151    Coag's  Recent Labs Lab 03/16/15 0825  INR 1.35    Sepsis Markers  Recent Labs Lab 03/10/15 0755 03/10/15 1410 03/10/15 1800 03/16/15 0825  LATICACIDVEN 3.6* 3.3* 2.8* 7.2*  PROCALCITON 0.39  --   --   --     ABG  Recent Labs Lab 03/10/15 1013 03/16/15 0829  PHART 7.341* 7.338*  PCO2ART 27.1* 46.7*  PO2ART 79.2* 436*    Liver Enzymes  Recent Labs Lab 03/14/15 0500 03/15/15 0530 03/16/15 0825  AST 65* 47* 58*  ALT 9* 7* 8*  ALKPHOS 135* 141* 150*  BILITOT 0.7 0.9 0.9  ALBUMIN 2.1* 2.3* 2.3*    Cardiac Enzymes  Recent Labs Lab 03/10/15 0755 03/10/15 1410 03/16/15 0825  TROPONINI 0.16* 0.25* 0.12*    Glucose  Recent Labs Lab 03/15/15 0729 03/15/15 1151 03/15/15 1622 03/15/15 2132 03/16/15 0751 03/16/15 0812  GLUCAP 99 156* 114* 174* 138* 166*    Imaging Ct Head Wo Contrast  03/16/2015  CLINICAL DATA:  Seizure, stage IV breast cancer. EXAM: CT HEAD WITHOUT  CONTRAST TECHNIQUE: Contiguous axial images were obtained from the base of the skull through the vertex without intravenous contrast. COMPARISON:  CT scan of July 01, 2009. FINDINGS: Bilateral maxillary sinusitis is noted. Lucencies are noted throughout the skull which may represent metastatic disease. Minimal diffuse cortical atrophy is noted. Minimal chronic ischemic white matter disease is noted. No mass effect or midline shift is noted. Ventricular size is within normal limits. There is no evidence of mass lesion, hemorrhage or acute infarction. IMPRESSION: Lucencies are noted throughout the skull concerning for metastatic disease as noted on prior exam. Minimal diffuse  cortical atrophy and chronic ischemic white matter disease. No acute intracranial abnormality seen. Electronically Signed   By: Marijo Conception, M.D.   On: 03/16/2015 09:22   Ct Angio Chest Pe W/cm &/or Wo Cm  03/16/2015  CLINICAL DATA:  Post arrest, post intubation. Stage IV right breast cancer, unresponsive. EXAM: CT ANGIOGRAPHY CHEST WITH CONTRAST TECHNIQUE: Multidetector CT imaging of the chest was performed using the standard protocol during bolus administration of intravenous contrast. Multiplanar CT image reconstructions and MIPs were obtained to evaluate the vascular anatomy. CONTRAST:  1103mL OMNIPAQUE IOHEXOL 350 MG/ML SOLN COMPARISON:  03/06/2015 FINDINGS: There is cardiomegaly. Aorta is normal caliber. Densely calcified coronary arteries diffusely. No filling defects in the pulmonary arteries to suggest pulmonary emboli. There are moderate bilateral pleural effusions, increasing since prior study. Probable compressive atelectasis in the lower lobes bilaterally. Mild ground-glass opacities throughout the lungs could reflect mild edema. No mediastinal, hilar, or axillary adenopathy. Previously seen nodularity in the lungs not well visualized due to airspace disease and ground-glass opacities. Diffuse sclerotic metastases throughout the visualized bony thorax. Right Port-A-Cath is in place with the tip in the SVC. Previously described numerous liver lesions cannot be visualized on this chest CTA. Review of the MIP images confirms the above findings. IMPRESSION: No evidence of pulmonary embolus. Moderate bilateral pleural effusions, enlarging since prior study. Probable compressive atelectasis versus consolidation in the lower lobes. Cardiomegaly.  Mild ground-glass opacities may reflect early edema. Diffuse coronary artery disease. Diffuse sclerotic bony metastases. Electronically Signed   By: Rolm Baptise M.D.   On: 03/16/2015 09:37   Dg Chest Port 1 View  03/16/2015  CLINICAL DATA:  Could in this  morning.  Evaluate ET tube placement EXAM: PORTABLE CHEST 1 VIEW COMPARISON:  03/10/2015 FINDINGS: Endotracheal tube is 3.6 cm above the carina. Right Port-A-Cath tip at the cavoatrial junction. Heart is upper limits normal in size. Bilateral lower lobe airspace opacities and probable layering effusions again noted, similar prior study. IMPRESSION: Bilateral lower lobe airspace opacities and small layering effusions, similar to prior study. Endotracheal tube 3.6 cm above the carina. Electronically Signed   By: Rolm Baptise M.D.   On: 03/16/2015 10:18   STUDIES:  CT chest 2/4 >> Posterior RUL opacity compatible with PNA. Small bilateral pleural effusion. Multiple previously described lung nodules obscured by acute changes.  Echo 2/8 >> EF 30-35%, inferior akinesis, severe mitral regurgitation, PA pressure 53 CT head 2/14 >> Lucencies noted throughout skull concerning for metastatic disease  CTA Chest 2/14 >> neg for PE, enlarging moderate bilateral pleural effusions, diffuse bony metastases   CULTURES: Urine 1/31 >> ESBL E. Coli 40,000 colonies  Blood 1/31 >> Negative   ANTIBIOTICS: Vancomycin 1/31 >> 2/4  Zosyn 1/31 >> 2/4  Primaxin 2/3 >> 2/4  Azithromycin 2/4 >>2/8  Rocephin 2/4 >> 2/8 Primaxin 2/8 >> 2/13 Vancomycin 2/8 >> 2/13 Levaquin 2/13 >> 2/14  SIGNIFICANT EVENTS: 1/31 - Admitted for sepsis likely due to HCAP  2/08 - Rapid response initiated for AMS, lethargy, transferred to SDU, PCCM consulted  2/14 - Seizure, cardiac arrest, transferred to ICU, PCCM re-consulted    LINES/TUBES: ETT 2/14 >>   DISCUSSION: 75 y/o female with complicated PMH including Stage IV right breast cancer currently on Eribulin/Neulasta followed By Dr. [name] who presented 1/31 with sepsis like secondary to HCAP. Subsequently found to have RUL infiltrate and ESBL UTI, received 14 days total of abx coverage so far. Hospital course complicated by acute systolic CHF, paroxysmal atrial  tachycardia. Seizure like activity and cardiac arrest on 2/14 with ROSC after 8 minutes. Potential etiologies include brain mets, sepsis, MI, cardiac dysrhythmia, PE.   ASSESSMENT / PLAN:  PULMONARY A: Acute Respiratory Failure in the setting of cardiopulmonary arrest - CT negative for PE HCAP - RUL infiltrate on CT, received 14 days total of abx so far  OSA - unclear from prior notes if on CPAP at home  Pulmonary nodules - ? Metastatic disease  Bilateral pleural effusions - on CT 2/4  P:  Full MV support, 8cc/kg  Wean FiO2 and PEEP as able to maintain sats > 94% ABG reviewed post intubation, increase rate to 16  CXR now  Intermittent CXR  See ID  Consider sampling effusions in next few days if not decreasing with lasix Duoneb Q4 prn   CARDIOVASCULAR A:  Cardiac Arrest - etiology unclear at this point, ROSC after 1 round of Epi  Paroxysmal Atrial Tachycardia/Afib with RVR - cardiology following, thought to be due to sepsis  Systolic CHF - echo performed 2/8 with EF 30-35%  Anasarca - likely due to hypoalbunemia + CHF  HTN  P:  Transfer to ICU, telemetry monitoring  Not candidate for cooling in the setting of metastatic disease  Assess troponins  Lasix per cardiology, received 2/14.  May need to hold in am 2/15 pending sr cr review Cardiology recommending ASA/Plavix for anticoagulation, will continue  Continue Lipitor  Continue Coreg for now   RENAL A:  CKD - baseline 1.2, currently better than baseline  Hypomagnesemia  Pseudohypocalcemia - corrects to 9.3 with albumin  Anion gap acidosis - ? 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 [name]  following Leukocytosis - suspect post CPR  Normocytic Anemia - likely anemia of chronic disease  DVT prophylaxis  P:  Monitor CBC/fever curve  See ID  Transfuse per ICU protocol as indicated  SCDs   INFECTIOUS A:  Sepsis due to HCAP/UTI - appears to be resolved, no leukocytosis or fevers  P:  Monitor fever curve  D/C ABX, completed 14 days   ENDOCRINE A:  Hypothyroidism  Type II DM  P:  Continue Synthroid  SSI Q4   NEUROLOGIC A:  Acute encephalopathy s/p cardiac arrest  Seizure  ?? Brain metastasis - multiple lucencies noted in skull on head CT 2/14 Cancer Pain P:  RASS goal: 0 Fentanyl gtt for pain  PRN versed for sedation  Assess EEG  Consider IV Keppra depending on results of head CT/EEG    FAMILY  - Updates: Multiple family members updated 2/14 at bedside.  Initiated goals of care  discussion with family.  They would like time to talk with other members. Full Code for now.   - Inter-disciplinary family meet or Palliative Care meeting due by: 2/21   Noe Gens, NP-C [facility] Pgr: [phone] or if no answer [phone] 03/16/2015, 10:29 AM  PCCM Attending Note: Patient seen and examined with nurse practitioner. Please refer to her note which I have reviewed in detail. Patient suffered cardiac arrest this afternoon that was brief with return of spontaneous circulation. At this time she appears largely appropriate following commands moving all 4 extremities equally. I do question whether or not she may have intracerebral metastases that could have precipitated and lead to a focus of seizure activity. I had a lengthy discussion with the patient's family at bedside addressing their concerns. We will plan for MRI and EEG today. Patient will remain intubated until she returns from MRI and we can better establish her saturation and weaning her oxygen requirement.  I have spent a total of 33 minutes of critical care time  today caring for the patient, reviewing the patient's electronic medical record, and updating the patient's family at length at bedside.  Sonia Baller Ashok Cordia, M.D. [facility] Pager:  [phone] After 3pm or if no response, call [phone]

## 2015-03-16 NOTE — Progress Notes (Signed)
[facility] Progress Note Patient Name: Brittney Cunningham DOB: 08-13-1940 MRN: [id number]   Date of Service  03/16/2015  HPI/Events of Note  Multiple issues: 1. PAC's and 2. Fentanyl order not written to titrate.   eICU Interventions  Will order: 1. Place OGT and restart Coreg. 2. Portable KUB s/p OGT placement. 3. BMP and Mg++ level now.  4. Fentanyl order changed to allow for titration.     Intervention Category Major Interventions: Arrhythmia - evaluation and management Minor Interventions: Agitation / anxiety - evaluation and management  [name] 03/16/2015, 11:49 PM

## 2015-03-16 NOTE — Progress Notes (Signed)
[facility] Progress Note Patient Name: Brittney Cunningham DOB: 1940/05/28 MRN: [id number]   Date of Service  03/16/2015  HPI/Events of Note  EEG today with general slowing Per RN followed commands today While in MRI however had seizure, aborting MRI   eICU Interventions  Give keppra now Consult neurology     Intervention Category Major Interventions: Seizures - evaluation and management  Simonne Maffucci 03/16/2015, 4:08 PM

## 2015-03-16 NOTE — ED Provider Notes (Signed)
Brittney Cunningham  Department of Emergency Medicine   Code Blue CONSULT NOTE  Chief Complaint: Cardiac arrest/unresponsive   Level V Caveat: Unresponsive  History of present illness: I was contacted by the hospital for a CODE BLUE cardiac arrest upstairs and presented to the patient's bedside.   39F with CAD (cath 06/2013: CTO RCA, mod-sev oCx disease - PCI of Cx would be too complex so managed medically), HTN, HLD, metastatic breast CA to bone/liver, anemia, DM, carotid disease (s/p R CEA, L stenting).  Per RN, pt started having seizure like activity and became unresponsive and pulseless. CPR in process when we arrived. Pt received 1 dose of epi. She had ROSC. Pt had poor resp status, she was obtunded, and not following commands. Pt also had hypoxia. Nurse Anesthesia was asked to intubate the patient.  ROS: Unable to obtain, Level V caveat  Scheduled Meds: . aspirin  81 mg Oral Daily  . atorvastatin  80 mg Oral Daily  . calcium carbonate  1 tablet Oral TID WC  . carvedilol  15.625 mg Oral BID WC  . clopidogrel  75 mg Oral Daily  . furosemide  40 mg Oral Daily  . guaiFENesin  600 mg Oral BID  . insulin aspart  0-5 Units Subcutaneous QHS  . insulin aspart  0-9 Units Subcutaneous TID WC  . levofloxacin (LEVAQUIN) IV  750 mg Intravenous Q24H  . levothyroxine  150 mcg Oral QAC breakfast  . mirtazapine  7.5 mg Oral QHS  . pantoprazole (PROTONIX) IV  40 mg Intravenous Q24H  . sodium chloride flush  10-40 mL Intracatheter Q12H   Continuous Infusions: . fentaNYL infusion INTRAVENOUS    . midazolam (VERSED) infusion     PRN Meds:.acetaminophen **OR** acetaminophen, glycerin adult, guaiFENesin-dextromethorphan, ipratropium-albuterol, morphine, ondansetron **OR** ondansetron (ZOFRAN) IV, sodium chloride flush Past Medical History  Diagnosis Date  . Anemia     anemia of renal insuff and neoplastic dz (bone mets).  Pt on aranesp and B12, plus transfusion prn.  . Anxiety and  depression   . GERD (gastroesophageal reflux disease)   . Hyperlipidemia   . Hypothyroidism   . Osteoarthritis   . Carotid artery stenosis     bilateral  . CAD (coronary artery disease)     a. 06/2013 MV: inflat infarct with small area of antlat and inf ischemia;  b. 06/2013 Cath: CTO RCA, LM/LAD patent, LCX mod/sev ostial dzs-->medical therapy, PCI of LCX would be too complex-med mgmt recommended.  . OSA (obstructive sleep apnea)   . Hypertension   . Unspecified constipation 03/15/2012  . Bursitis   . Collagen vascular disease ([place])   . History of radiation therapy 10/31/12-11/13/12    rt&lt humerous 30Gy/2fx  . Valvular heart disease 07/11/2013  . Diarrhea 09/28/2013  . Chronic renal insufficiency, stage II (mild)     Borderline stage II/III, CrCl 50s/60s.  . History of blood transfusion   . Urinary tract bacterial infections   . Breast cancer ([place]) 05/2009    Grade 2 invasive ductal carcinoma, T2 NXM1, stage IV at presentation. There was bone only involvement. New liver lesions 12/2014 (path confirmed mets): pt to start chemo (eribucin) treatments to start on February 17, 2015. She will receive treatment on the first and eighth day of each 21 day cycle. The plan is to go to 3 or 4 cycles before restaging  . Diabetes mellitus type II     insulin  . Hypocalcemia 07/2014    required IV calcium in ED  once and was admitted to hosp once.  ? secondary to [facility] and everolimus?  . Drug-induced pneumonitis 08/2014    (Everolimus) CXR 09/21/14 improved; needs another CXR 2-3 wks later to make sure resolution occurs.   Past Surgical History  Procedure Laterality Date  . Incisional breast biopsy      remoted left breast biopsy  . Cesarean section    . Other surgical history      GYN surgery  . Knee surgery      right knee surgery  . Carotid endarterectomy  2011    right carotid  . Port-a-cath removal    . Modified radical mastectomy  Feb 2012    Right breast  . Carotid stent      left  .  Cardiac catheterization  06/2013    old total occ. of RCA, moderately severe ostial LCX stenosis- medical therapy- would be complex PCI  . Carotid angiogram N/A 03/01/2011    Procedure: CAROTID ANGIOGRAM;  Surgeon: Conrad Afton, MD;  Location: [facility] CATH LAB;  Service: Cardiovascular;  Laterality: N/A;  . Arch aortogram  03/01/2011    Procedure: ARCH AORTOGRAM;  Surgeon: Conrad , MD;  Location: [facility] CATH LAB;  Service: Cardiovascular;;  . Carotid stent insertion Left 03/15/2011    Procedure: CAROTID STENT INSERTION;  Surgeon: Serafina Mitchell, MD;  Location: [facility] CATH LAB;  Service: Cardiovascular;  Laterality: Left;  . Left heart catheterization with coronary angiogram N/A 07/07/2013    Procedure: LEFT HEART CATHETERIZATION WITH CORONARY ANGIOGRAM;  Surgeon: Blane Ohara, MD;  Location: [facility] CATH LAB;  Service: Cardiovascular;  Laterality: N/A;  . Carotid dopplers  05/2014    Patent ICAs, bilat ECA dz: no change compared to 2014  . Transthoracic echocardiogram  06/2013    EF 55-60%, grd I diast dysfxn, mod mitral regurg   Social History   Social History  . Marital Status: Married    Spouse Name: N/A  . Number of Children: 3  . Years of Education: N/A   Occupational History  . 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 Smoking status: Former Smoker -- 1.00 packs/day for 20 years    Types: Cigarettes    Quit date: 01/31/1988  . Smokeless tobacco: Never Used  . Alcohol Use: No  . Drug Use: No  . Sexual Activity: Not Currently    Birth Control/ Protection: Post-menopausal   Other Topics Concern  . Not on file   Social History Narrative   Retired-ran a daycare facility for 40 years.   Married- 17 years    2 sons    1 daughter - Marine scientist midwife   Former smoker-quit in [place].    Allergies  Allergen Reactions  . Chlorhexidine Hives, Itching and Rash    This was most likely a CONTACT DERMATITIS versus true systemic allergic reaction  . Adhesive [Tape] Hives  .  Codeine Swelling    Last set of Vital Signs (not current) Filed Vitals:   03/16/15 0238 03/16/15 0445  BP: 143/79 137/78  Pulse: 139 108  Temp: 98.9 F (37.2 C) 97.7 F (36.5 C)  Resp: 20 18      Physical Exam  Gen: unresponsive Cardiovascular: tachycardic, irregular  Resp: apneic. Breath sounds equal bilaterally with bagging  Abd: nondistended  Neuro: GCS 5, unresponsive to pain  HEENT: No blood in posterior pharynx, gag reflex absent  Neck: No crepitus  Musculoskeletal: No deformity  Skin: warm  Procedures    CRITICAL CARE Performed by: Varney Biles Total critical care time: 35 min Critical care time was exclusive of separately billable procedures and treating other patients. Critical care was necessary to treat or prevent imminent or life-threatening deterioration. Critical care was time spent personally by me on the following activities: development of treatment plan with patient and/or surrogate as well as nursing, discussions with consultants, evaluation of patient's response to treatment, examination of patient, obtaining history from patient or surrogate, ordering and performing treatments and interventions, ordering and review of laboratory studies, ordering and review of radiographic studies, pulse oximetry and re-evaluation of patient's condition.  Cardiopulmonary Resuscitation (CPR) Procedure Note  Directed/Performed by: Varney Biles I personally directed ancillary staff and/or performed CPR in an effort to regain return of spontaneous circulation and to maintain cardiac, neuro and systemic perfusion.    Medical Decision making   Pt admitted for sepsis. Has stage 4 cancer of the breast with mets.  Post arrest. ? Seizure like activity. Now in afib with RVR. RVR is not new. CHADVASC score is high - but we were told that Cards is treating afib with plavix - we will not initiate anticoagulation now - defer to CCM.  DDX: Stroke PE Metabolic  derangement Cardiac dysrhythmia Sepsis Brain mets Infarct  Assessment and Plan   Dr. Milinda Hirschfeld CCM notified. PT will get CT scans and labs and be sent to the unit.   Varney Biles, MD 03/16/15 [phone]

## 2015-03-16 NOTE — Progress Notes (Signed)
Cardiology was notified. MD on call for Heart Care reviewed the EKG. No new orders at this time.

## 2015-03-16 NOTE — Consult Note (Addendum)
Admission H&P    Chief Complaint: Status post cardiac arrest; new onset seizure activity.  HPI: Brittney Cunningham is an 75 y.o. 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 Patient reportedly had seizure-like activity earlier today than developed cardiac arrest. She responded promptly to 1 round of epinephrine. She was subsequently intubated and placed on mechanical ventilation. Patient had become responsive and interactive, but subsequently had a witnessed generalized seizure while in MRI study of her brain was being obtained. Study could not be completed. Acute stroke was ruled out, however. A loading dose of Keppra 1000 mg has been ordered and is pending. She had an EEG earlier which was reviewed by me. Study showed moderate generalized slowing of cerebral activity but was otherwise unremarkable. EEG was performed prior to MRI and most recent seizure.  Past Medical History  Diagnosis Date  . Anemia     anemia of renal insuff and neoplastic dz (bone mets).  Pt on aranesp and B12, plus transfusion prn.  . Anxiety and depression   . GERD (gastroesophageal reflux disease)   . Hyperlipidemia   . Hypothyroidism   . Osteoarthritis   . Carotid artery stenosis     bilateral  . CAD (coronary artery disease)     a. 06/2013 MV: inflat infarct with small area of antlat and inf ischemia;  b. 06/2013 Cath: CTO RCA, LM/LAD patent, LCX mod/sev ostial dzs-->medical therapy, PCI of LCX would be too complex-med mgmt recommended.  . OSA (obstructive sleep apnea)   . Hypertension   . Unspecified constipation 03/15/2012  . Bursitis   . Collagen vascular disease (HCC)   . History of radiation therapy 10/31/12-11/13/12    rt&lt humerous 30Gy/60fx  . Valvular heart disease 07/11/2013  . Diarrhea 09/28/2013  . Chronic renal insufficiency, stage II (mild)      Borderline stage II/III, CrCl 50s/60s.  . History of blood transfusion   . Urinary tract bacterial infections   . Breast cancer (HCC) 05/2009    Grade 2 invasive ductal carcinoma, T2 NXM1, stage IV at presentation. There was bone only involvement. New liver lesions 12/2014 (path confirmed mets): pt to start chemo (eribucin) treatments to start on February 17, 2015. She will receive treatment on the first and eighth day of each 21 day cycle. The plan is to go to 3 or 4 cycles before restaging  . Diabetes mellitus type II     insulin  . Hypocalcemia 07/2014    required IV calcium in ED once and was admitted to hosp once.  ? secondary to [facility] and everolimus?  . Drug-induced pneumonitis 08/2014    (Everolimus) CXR 09/21/14 improved; needs another CXR 2-3 wks later to make sure resolution occurs.    Past Surgical History  Procedure Laterality Date  . Incisional breast biopsy      remoted left breast biopsy  . Cesarean section    . Other surgical history      GYN surgery  . Knee surgery      right knee surgery  . Carotid endarterectomy  2011    right carotid  . Port-a-cath removal    . Modified radical mastectomy  Feb 2012    Right breast  . Carotid stent      left  . Cardiac catheterization  06/2013    old total occ. of RCA, moderately severe ostial LCX stenosis- medical therapy-  would be complex PCI  . Carotid angiogram N/A 03/01/2011    Procedure: CAROTID ANGIOGRAM;  Surgeon: Conrad Ashton, MD;  Location: [facility] CATH LAB;  Service: Cardiovascular;  Laterality: N/A;  . Arch aortogram  03/01/2011    Procedure: ARCH AORTOGRAM;  Surgeon: Conrad Hagerman, MD;  Location: [facility] CATH LAB;  Service: Cardiovascular;;  . Carotid stent insertion Left 03/15/2011    Procedure: CAROTID STENT INSERTION;  Surgeon: Serafina Mitchell, MD;  Location: [facility] CATH LAB;  Service: Cardiovascular;  Laterality: Left;  . Left heart catheterization with coronary angiogram N/A 07/07/2013    Procedure: LEFT HEART CATHETERIZATION WITH  CORONARY ANGIOGRAM;  Surgeon: Blane Ohara, MD;  Location: [facility] CATH LAB;  Service: Cardiovascular;  Laterality: N/A;  . Carotid dopplers  05/2014    Patent ICAs, bilat ECA dz: no change compared to 2014  . Transthoracic echocardiogram  06/2013    EF 55-60%, grd I diast dysfxn, mod mitral regurg    Family History  Problem Relation Age of Onset  . Arthritis    . Coronary artery disease      first degree relative  . Stroke      first degree relative  . Diabetes Mother   . Heart disease Mother   . Hypertension Mother   . Cancer Mother   . Deep vein thrombosis Mother   . Colon cancer Neg Hx   . Stomach cancer Neg Hx   . Cancer Father     Pancreatic cancer  . Breast cancer Paternal Aunt 64   Social History:  reports that she quit smoking about 27 years ago. Her smoking use included Cigarettes. She has a 20 pack-year smoking history. She has never used smokeless tobacco. She reports that she does not drink alcohol or use illicit drugs.  Allergies:  Allergies  Allergen Reactions  . Chlorhexidine Hives, Itching and Rash    This was most likely a CONTACT DERMATITIS versus true systemic allergic reaction  . Adhesive [Tape] Hives  . Codeine Swelling    Medications Prior to Admission  Medication Sig Dispense Refill  . aspirin 81 MG chewable tablet Chew 81 mg by mouth daily.    Marland Kitchen atorvastatin (LIPITOR) 80 MG tablet Take 1 tablet (80 mg total) by mouth daily. 30 tablet 11  . calcium carbonate (TUMS) 500 MG chewable tablet Chew 1 tablet (200 mg of elemental calcium total) by mouth 3 (three) times daily with meals. (Patient taking differently: Chew 1 tablet by mouth 3 (three) times daily as needed for indigestion or heartburn. ) 30 tablet 0  . carvedilol (COREG) 25 MG tablet Take 1 tablet (25 mg total) by mouth 2 (two) times daily with a meal. (Patient taking differently: Take 25 mg by mouth 2 (two) times daily with a meal. Pt taking 1/2 of carvediol tablet twice daily.) 60 tablet 8  .  clopidogrel (PLAVIX) 75 MG tablet Take 1 tablet (75 mg total) by mouth daily. 30 tablet 8  . cyanocobalamin (,VITAMIN B-12,) 1000 MCG/ML injection Inject 1,000 mcg into the muscle every 30 (thirty) days.     Marland Kitchen gabapentin (NEURONTIN) 100 MG capsule Take 1 capsule (100 mg total) by mouth 3 (three) times daily. 90 capsule 2  . glycerin adult (GLYCERIN ADULT) 2 G SUPP Place 1 suppository rectally once as needed (constipation). 10 suppository 0  . insulin detemir (LEVEMIR) 100 UNIT/ML injection Inject 0.15 mLs (15 Units total) into the skin at bedtime. 10 mL 0  . insulin lispro (HUMALOG KWIKPEN) 100  UNIT/ML KiwkPen 2 U SQ qAM, 3 U SQ qLUNCH, and 3 U SQ qSupper 15 mL 11  . levothyroxine (SYNTHROID, LEVOTHROID) 150 MCG tablet 1 tab po qd except take 1 and 1/2 tabs on Mondays and Fridays 34 tablet 1  . mirtazapine (REMERON) 15 MG tablet Take 1 tablet (15 mg total) by mouth at bedtime. 30 tablet 3  . morphine (MS CONTIN) 30 MG 12 hr tablet Take 1 tablet (30 mg total) by mouth 3 (three) times daily. 90 tablet 0  . morphine (MSIR) 15 MG tablet Take 1 tablet (15 mg total) by mouth every 4 (four) hours as needed for severe pain. 60 tablet 0  . ondansetron (ZOFRAN) 8 MG tablet Take 8 mg by mouth every 8 (eight) hours as needed for nausea or vomiting.     . potassium chloride SA (K-DUR,KLOR-CON) 20 MEQ tablet Take 2 tablets (40 mEq total) by mouth daily. 60 tablet 3  . prochlorperazine (COMPAZINE) 10 MG tablet Take 10 mg by mouth every 6 (six) hours as needed for nausea or vomiting.     . senna-docusate (SENOKOT-S) 8.6-50 MG per tablet Take 1 tablet by mouth at bedtime.     Marland Kitchen ACCU-CHEK AVIVA PLUS test strip CHECK BLOOD SUGAR TWICE A DAY FOR DX:250.00 100 each 11  . Blood Glucose Monitoring Suppl (ACCU-CHEK NANO SMARTVIEW) W/DEVICE KIT Use to check blood sugar twice a day. DX 250.00 1 kit 0  . darbepoetin (ARANESP) 200 MCG/0.4ML SOLN Inject 300 mcg into the skin every 14 (fourteen) days.    Marland Kitchen lidocaine-prilocaine  (EMLA) cream Apply to affected area once 30 g 3  . nitroGLYCERIN (NITROSTAT) 0.4 MG SL tablet Place 1 tablet (0.4 mg total) under the tongue every 5 (five) minutes as needed for chest pain. 30 tablet 0  . polyethylene glycol (MIRALAX / GLYCOLAX) packet Take 17 g by mouth daily as needed for mild constipation. Reported on 02/17/2015      Physical Examination: Blood pressure 127/46, pulse 83, temperature 98 F (36.7 C), temperature source Axillary, resp. rate 19, height '5\' 3"'$  (1.6 m), weight 77.5 kg (170 lb 13.7 oz), last menstrual period 03/13/2012, SpO2 100 %.  HEENT-  Normocephalic, no lesions, without obvious abnormality.  Normal external eye and conjunctiva.  Normal TM's bilaterally.  Normal auditory canals and external ears. Normal external nose, mucus membranes and septum.  Normal pharynx. Neck supple with no masses, nodes, nodules or enlargement. Cardiovascular - regular rate and rhythm, S1, S2 normal, no murmur, click, rub or gallop Lungs - chest clear, no wheezing, rales, normal symmetric air entry Abdomen - soft, non-tender; bowel sounds normal; no masses,  no organomegaly Extremities - no joint deformities, effusion, or inflammation and no edema  Neurologic Examination: Patient was intubated and on mechanical ventilation. She has spontaneous respirations as well. She was arousable and was able to follow simple commands, although she was clearly still slightly confused. Pupils were equal and reacted normally to light. Extraocular movements were full and conjugate on right and left lateral gaze. Face was symmetrical with no focal weakness. Muscle tone was flaccid throughout. Patient had equal movement and strength of upper and lower extremities throughout. Deep tendon reflexes were trace only and symmetrical. Plantar responses were mute.  Results for orders placed or performed during the hospital encounter of 03/02/15 (from the past 48 hour(s))  Glucose, capillary     Status:  Abnormal   Collection Time: 03/14/15  9:39 PM  Result Value Ref Range   Glucose-Capillary 169 (  H) 65 - 99 mg/dL  Comprehensive metabolic panel     Status: Abnormal   Collection Time: 03/15/15  5:30 AM  Result Value Ref Range   Sodium 142 135 - 145 mmol/L   Potassium 3.6 3.5 - 5.1 mmol/L   Chloride 106 101 - 111 mmol/L   CO2 27 22 - 32 mmol/L   Glucose, Bld 109 (H) 65 - 99 mg/dL   BUN 19 6 - 20 mg/dL   Creatinine, Ser 0.76 0.44 - 1.00 mg/dL   Calcium 7.5 (L) 8.9 - 10.3 mg/dL   Total Protein 4.9 (L) 6.5 - 8.1 g/dL   Albumin 2.3 (L) 3.5 - 5.0 g/dL   AST 47 (H) 15 - 41 U/L   ALT 7 (L) 14 - 54 U/L   Alkaline Phosphatase 141 (H) 38 - 126 U/L   Total Bilirubin 0.9 0.3 - 1.2 mg/dL   GFR calc non Af Amer >60 >60 mL/min   GFR calc Af Amer >60 >60 mL/min    Comment: (NOTE) The eGFR has been calculated using the CKD EPI equation. This calculation has not been validated in all clinical situations. eGFR's persistently <60 mL/min signify possible Chronic Kidney Disease.    Anion gap 9 5 - 15  Glucose, capillary     Status: None   Collection Time: 03/15/15  7:29 AM  Result Value Ref Range   Glucose-Capillary 99 65 - 99 mg/dL  Glucose, capillary     Status: Abnormal   Collection Time: 03/15/15 11:51 AM  Result Value Ref Range   Glucose-Capillary 156 (H) 65 - 99 mg/dL  Glucose, capillary     Status: Abnormal   Collection Time: 03/15/15  4:22 PM  Result Value Ref Range   Glucose-Capillary 114 (H) 65 - 99 mg/dL  Glucose, capillary     Status: Abnormal   Collection Time: 03/15/15  9:32 PM  Result Value Ref Range   Glucose-Capillary 174 (H) 65 - 99 mg/dL  Magnesium     Status: Abnormal   Collection Time: 03/16/15  5:35 AM  Result Value Ref Range   Magnesium 1.5 (L) 1.7 - 2.4 mg/dL  Basic metabolic panel     Status: Abnormal   Collection Time: 03/16/15  5:35 AM  Result Value Ref Range   Sodium 144 135 - 145 mmol/L   Potassium 4.0 3.5 - 5.1 mmol/L   Chloride 103 101 - 111 mmol/L   CO2  25 22 - 32 mmol/L   Glucose, Bld 147 (H) 65 - 99 mg/dL   BUN 19 6 - 20 mg/dL   Creatinine, Ser 0.97 0.44 - 1.00 mg/dL   Calcium 7.9 (L) 8.9 - 10.3 mg/dL   GFR calc non Af Amer 57 (L) >60 mL/min   GFR calc Af Amer >60 >60 mL/min    Comment: (NOTE) The eGFR has been calculated using the CKD EPI equation. This calculation has not been validated in all clinical situations. eGFR's persistently <60 mL/min signify possible Chronic Kidney Disease.    Anion gap 16 (H) 5 - 15  CBC     Status: Abnormal   Collection Time: 03/16/15  5:35 AM  Result Value Ref Range   WBC 9.2 4.0 - 10.5 K/uL   RBC 2.93 (L) 3.87 - 5.11 MIL/uL   Hemoglobin 8.4 (L) 12.0 - 15.0 g/dL   HCT 27.2 (L) 36.0 - 46.0 %   MCV 92.8 78.0 - 100.0 fL   MCH 28.7 26.0 - 34.0 pg   MCHC 30.9 30.0 -  36.0 g/dL   RDW 16.7 (H) 11.5 - 15.5 %   Platelets 141 (L) 150 - 400 K/uL  Glucose, capillary     Status: Abnormal   Collection Time: 03/16/15  7:51 AM  Result Value Ref Range   Glucose-Capillary 138 (H) 65 - 99 mg/dL  Glucose, capillary     Status: Abnormal   Collection Time: 03/16/15  8:12 AM  Result Value Ref Range   Glucose-Capillary 166 (H) 65 - 99 mg/dL  CBC with Differential/Platelet     Status: Abnormal   Collection Time: 03/16/15  8:25 AM  Result Value Ref Range   WBC 16.7 (H) 4.0 - 10.5 K/uL   RBC 2.99 (L) 3.87 - 5.11 MIL/uL   Hemoglobin 8.6 (L) 12.0 - 15.0 g/dL   HCT 28.6 (L) 36.0 - 46.0 %   MCV 95.7 78.0 - 100.0 fL   MCH 28.8 26.0 - 34.0 pg   MCHC 30.1 30.0 - 36.0 g/dL   RDW 17.0 (H) 11.5 - 15.5 %   Platelets 151 150 - 400 K/uL   Neutrophils Relative % 58 %   Lymphocytes Relative 36 %   Monocytes Relative 6 %   Eosinophils Relative 0 %   Basophils Relative 0 %   Neutro Abs 9.7 (H) 1.7 - 7.7 K/uL   Lymphs Abs 6.0 (H) 0.7 - 4.0 K/uL   Monocytes Absolute 1.0 0.1 - 1.0 K/uL   Eosinophils Absolute 0.0 0.0 - 0.7 K/uL   Basophils Absolute 0.0 0.0 - 0.1 K/uL   WBC Morphology MILD LEFT SHIFT (1-5% METAS, OCC MYELO,  OCC BANDS)   Comprehensive metabolic panel     Status: Abnormal   Collection Time: 03/16/15  8:25 AM  Result Value Ref Range   Sodium 142 135 - 145 mmol/L   Potassium 4.7 3.5 - 5.1 mmol/L   Chloride 104 101 - 111 mmol/L   CO2 21 (L) 22 - 32 mmol/L   Glucose, Bld 198 (H) 65 - 99 mg/dL   BUN 20 6 - 20 mg/dL   Creatinine, Ser 1.15 (H) 0.44 - 1.00 mg/dL   Calcium 7.6 (L) 8.9 - 10.3 mg/dL   Total Protein 5.3 (L) 6.5 - 8.1 g/dL   Albumin 2.3 (L) 3.5 - 5.0 g/dL   AST 58 (H) 15 - 41 U/L   ALT 8 (L) 14 - 54 U/L   Alkaline Phosphatase 150 (H) 38 - 126 U/L   Total Bilirubin 0.9 0.3 - 1.2 mg/dL   GFR calc non Af Amer 46 (L) >60 mL/min   GFR calc Af Amer 54 (L) >60 mL/min    Comment: (NOTE) The eGFR has been calculated using the CKD EPI equation. This calculation has not been validated in all clinical situations. eGFR's persistently <60 mL/min signify possible Chronic Kidney Disease.    Anion gap 17 (H) 5 - 15  Troponin I     Status: Abnormal   Collection Time: 03/16/15  8:25 AM  Result Value Ref Range   Troponin I 0.12 (H) <0.031 ng/mL    Comment:        PERSISTENTLY INCREASED TROPONIN VALUES IN THE RANGE OF 0.04-0.49 ng/mL CAN BE SEEN IN:       -UNSTABLE ANGINA       -CONGESTIVE HEART FAILURE       -MYOCARDITIS       -CHEST TRAUMA       -ARRYHTHMIAS       -LATE PRESENTING MYOCARDIAL INFARCTION       -COPD  CLINICAL FOLLOW-UP RECOMMENDED.   Culture, blood (Routine X 2) w Reflex to ID Panel     Status: None (Preliminary result)   Collection Time: 03/16/15  8:25 AM  Result Value Ref Range   Specimen Description BLOOD RIGHT WRIST    Special Requests BOTTLES DRAWN AEROBIC AND ANAEROBIC 5CC    Culture PENDING    Report Status PENDING   Protime-INR     Status: Abnormal   Collection Time: 03/16/15  8:25 AM  Result Value Ref Range   Prothrombin Time 16.8 (H) 11.6 - 15.2 seconds   INR 1.35 0.00 - 1.49  Lactic acid, plasma     Status: Abnormal   Collection Time: 03/16/15  8:25 AM   Result Value Ref Range   Lactic Acid, Venous 7.2 (HH) 0.5 - 2.0 mmol/L    Comment: CRITICAL RESULT CALLED TO, READ BACK BY AND VERIFIED WITH: WEST,P. RN AT 8296 03/16/15 MULLINS,T   Blood gas, arterial     Status: Abnormal   Collection Time: 03/16/15  8:29 AM  Result Value Ref Range   FIO2 1.00    Delivery systems VENTILATOR    Mode PRESSURE REGULATED VOLUME CONTROL    VT 400 mL   LHR 14 resp/min   Peep/cpap 5.0 cm H20   pH, Arterial 7.338 (L) 7.350 - 7.450   pCO2 arterial 46.7 (H) 35.0 - 45.0 mmHg   pO2, Arterial 436 (H) 80.0 - 100.0 mmHg   Bicarbonate 24.4 (H) 20.0 - 24.0 mEq/L   TCO2 23.5 0 - 100 mmol/L   Acid-base deficit 0.9 0.0 - 2.0 mmol/L   O2 Saturation 99.5 %   Patient temperature 37.0    Collection site LEFT RADIAL    Drawn by [id number]    Sample type ARTERIAL DRAW    Allens test (pass/fail) PASS PASS  Glucose, capillary     Status: Abnormal   Collection Time: 03/16/15 12:21 PM  Result Value Ref Range   Glucose-Capillary 120 (H) 65 - 99 mg/dL  Lactic acid, plasma     Status: Abnormal   Collection Time: 03/16/15  1:00 PM  Result Value Ref Range   Lactic Acid, Venous 2.4 (HH) 0.5 - 2.0 mmol/L    Comment: CRITICAL RESULT CALLED TO, READ BACK BY AND VERIFIED WITH: WEST P RN AT 1341 ON 2.14.17 MARVIN S   Urinalysis, Routine w reflex microscopic (not at [facility])     Status: Abnormal   Collection Time: 03/16/15  1:14 PM  Result Value Ref Range   Color, Urine YELLOW YELLOW   APPearance CLOUDY (A) CLEAR   Specific Gravity, Urine 1.017 1.005 - 1.030   pH 5.0 5.0 - 8.0   Glucose, UA NEGATIVE NEGATIVE mg/dL   Hgb urine dipstick NEGATIVE NEGATIVE   Bilirubin Urine SMALL (A) NEGATIVE   Ketones, ur NEGATIVE NEGATIVE mg/dL   Protein, ur NEGATIVE NEGATIVE mg/dL   Nitrite NEGATIVE NEGATIVE   Leukocytes, UA NEGATIVE NEGATIVE    Comment: MICROSCOPIC NOT DONE ON URINES WITH NEGATIVE PROTEIN, BLOOD, LEUKOCYTES, NITRITE, OR GLUCOSE <1000 mg/dL.  Glucose, capillary     Status:  Abnormal   Collection Time: 03/16/15  4:41 PM  Result Value Ref Range   Glucose-Capillary 115 (H) 65 - 99 mg/dL   *Note: Due to a large number of results and/or encounters for the requested time period, some results have not been displayed. A complete set of results can be found in Results Review.   Ct Head Wo Contrast  03/16/2015  CLINICAL DATA:  Seizure,  stage IV breast cancer. EXAM: CT HEAD WITHOUT CONTRAST TECHNIQUE: Contiguous axial images were obtained from the base of the skull through the vertex without intravenous contrast. COMPARISON:  CT scan of July 01, 2009. FINDINGS: Bilateral maxillary sinusitis is noted. Lucencies are noted throughout the skull which may represent metastatic disease. Minimal diffuse cortical atrophy is noted. Minimal chronic ischemic white matter disease is noted. No mass effect or midline shift is noted. Ventricular size is within normal limits. There is no evidence of mass lesion, hemorrhage or acute infarction. IMPRESSION: Lucencies are noted throughout the skull concerning for metastatic disease as noted on prior exam. Minimal diffuse cortical atrophy and chronic ischemic white matter disease. No acute intracranial abnormality seen. Electronically Signed   By: Marijo Conception, M.D.   On: 03/16/2015 09:22   Ct Angio Chest Pe W/cm &/or Wo Cm  03/16/2015  CLINICAL DATA:  Post arrest, post intubation. Stage IV right breast cancer, unresponsive. EXAM: CT ANGIOGRAPHY CHEST WITH CONTRAST TECHNIQUE: Multidetector CT imaging of the chest was performed using the standard protocol during bolus administration of intravenous contrast. Multiplanar CT image reconstructions and MIPs were obtained to evaluate the vascular anatomy. CONTRAST:  126m OMNIPAQUE IOHEXOL 350 MG/ML SOLN COMPARISON:  03/06/2015 FINDINGS: There is cardiomegaly. Aorta is normal caliber. Densely calcified coronary arteries diffusely. No filling defects in the pulmonary arteries to suggest pulmonary emboli. There  are moderate bilateral pleural effusions, increasing since prior study. Probable compressive atelectasis in the lower lobes bilaterally. Mild ground-glass opacities throughout the lungs could reflect mild edema. No mediastinal, hilar, or axillary adenopathy. Previously seen nodularity in the lungs not well visualized due to airspace disease and ground-glass opacities. Diffuse sclerotic metastases throughout the visualized bony thorax. Right Port-A-Cath is in place with the tip in the SVC. Previously described numerous liver lesions cannot be visualized on this chest CTA. Review of the MIP images confirms the above findings. IMPRESSION: No evidence of pulmonary embolus. Moderate bilateral pleural effusions, enlarging since prior study. Probable compressive atelectasis versus consolidation in the lower lobes. Cardiomegaly.  Mild ground-glass opacities may reflect early edema. Diffuse coronary artery disease. Diffuse sclerotic bony metastases. Electronically Signed   By: KRolm BaptiseM.D.   On: 03/16/2015 09:37   Mr Brain Wo Contrast  03/16/2015  CLINICAL DATA:  Metastatic breast cancer with possible seizure. EXAM: MRI HEAD WITHOUT CONTRAST TECHNIQUE: Multiplanar, multiecho pulse sequences of the brain and surrounding structures were obtained without intravenous contrast. COMPARISON:  None. FINDINGS: Partial study (only sagittal T1 and diffusion imaging obtained) due to seizure during image acquisition. The available images are motion degraded. There is no evidence of infarct, hydrocephalus, or shift. Diffuse marrow hypo intensity correlating with history of widespread osseous metastases. Chronic sinusitis with diffuse mucosal thickening in the maxillary sinuses. IMPRESSION: 1. Incomplete study due to seizure. 2. Negative for acute infarct. Electronically Signed   By: JMonte FantasiaM.D.   On: 03/16/2015 15:54   Dg Chest Port 1 View  03/16/2015  CLINICAL DATA:  Could in this morning.  Evaluate ET tube placement  EXAM: PORTABLE CHEST 1 VIEW COMPARISON:  03/10/2015 FINDINGS: Endotracheal tube is 3.6 cm above the carina. Right Port-A-Cath tip at the cavoatrial junction. Heart is upper limits normal in size. Bilateral lower lobe airspace opacities and probable layering effusions again noted, similar prior study. IMPRESSION: Bilateral lower lobe airspace opacities and small layering effusions, similar to prior study. Endotracheal tube 3.6 cm above the carina. Electronically Signed   By: KRolm BaptiseM.D.  On: 03/16/2015 10:18    Assessment/Plan 75 year old lady with stage IV metastatic breast cancer as well as currently hospitalized for management of sepsis and pneumonia, with cardiac arrest earlier today as well as new-onset seizure activity. Etiology for seizure activity is unclear. MRI study of her brain could not be completed, and brain metastasis of breast cancer cannot be ruled out at this point. Acute stroke was ruled out with MRI study, however. Electrolytes were unremarkable earlier today. Significant hypoxic encephalopathy is unlikely, as patient is arousable and interactive at this point. Mild residual confusion is most likely a manifestation of postictal state.  Recommendations: 1. Complete MRI study of the brain with contrast as planned 2. Continue Keppra 1000 mg every 12 hours  We will continue to follow this patient with you.  C.R. Nicole Kindred, [facility] [phone]  03/16/2015, 4:58 PM

## 2015-03-16 NOTE — Progress Notes (Addendum)
EEG completed then Pt transported to MRI. Alert and Able to follow commands, bright eyed and nodding appropriately in MRI prior to scan. Pt moved legs some during scan, scan stopped and repositioned pt and scan restarted. Pt noted to be moving legs and arms again, scan once again stopped. This RN rushed to pt's side to prevent extubation and found pt actively seizing with clonic tonic movements of head, upper torso and bilateral arms. Pt's eyes were rolled back and she was unresponsive. Seizure lasted approximately 2 minutes. Pt remained on the vent, however color change of dusky blue noted. Heart monitor remained on with rate in 70's throughout seizure activity and sats remained 100%.  Returned pt to ICU bed and returned to room 1235 in ICU without further problems. Dr Lake Bells notified. Orders received. Neurologist consulted and Cardiologist revisited.

## 2015-03-16 NOTE — Anesthesia Procedure Notes (Signed)
Procedure Name: Intubation Date/Time: 03/16/2015 8:18 AM Performed by: Anne Fu Pre-anesthesia Checklist: Patient identified, Emergency Drugs available, Suction available, Patient being monitored and Timeout performed Patient Re-evaluated:Patient Re-evaluated prior to inductionOxygen Delivery Method: Circle system utilized Preoxygenation: Pre-oxygenation with 100% oxygen Intubation Type: IV induction Ventilation: Mask ventilation without difficulty Laryngoscope Size: Mac and 4 Grade View: Grade I Tube type: Oral Tube size: 7.5 mm Number of attempts: 1 Airway Equipment and Method: Stylet Placement Confirmation: ETT inserted through vocal cords under direct vision,  positive ETCO2,  CO2 detector and breath sounds checked- equal and bilateral Secured at: 22 cm Tube secured with: Tape Dental Injury: Teeth and Oropharynx as per pre-operative assessment  Comments: Secured per Resp. Awaiting vent, MD at bedside.

## 2015-03-16 NOTE — Progress Notes (Signed)
Patient: Brittney Cunningham / Admit Date: 03/02/2015 / Date of Encounter: 03/16/2015, 9:39 AM   Subjective:  Patient had seizure activity this AM followed by CODE BLUE event. History obtained by talking to nurse. Per nurse, the patient's mentation seemed slower this morning but she was still able to answer questions appropriately. Sometime between 7:45-8am the tech was in to take the patient's blood sugar when she suddenly developed seizure-like activity. Within a brief period of time (<2 minutes) the activity stopped but her mouth and eyes were open and she was unresponsive. Pulse was unable to be felt so compressions were started immediately. Per Dr. Andree Elk notes, she received 1 dose of epi and had ROSC, but had poor respiratory status with hypoxia/obtundation and was subsequently intubated.   Per review of telemetry the patient has had continued NSR/ST with paroxysms of PAT/MAT similar to what we have been following for this admission (NOT atrial fibrillation). Around 8am there is artifact on telemetry superimposed on this rhythm which we suspect represents the seizure activity, followed by brief junctional bradycardia with HR in the 30's. The bradycardia occurred very shortly before the patient was placed on the Zoll instead so the timing does not suggest that the seizure was precipitated by any arrhythmia. No further strips available shortly after this as the patient was then placed on the Zoll instead. Per nurse, she never required any defibrillation. CTA neg for PE; + mod bilateral pleural effusions enlarging since prior study, probable compressive atx versus consolidation, cardiomegaly, mild ground glass opacities, diffuse CAD, diffuse sclerotic bony mets.    Objective: Telemetry: see above Physical Exam: Blood pressure 137/78, pulse 108, temperature 97.7 F (36.5 C), temperature source Oral, resp. rate 18, height 5\' 3"  (1.6 m), weight 166 lb 7.2 oz (75.5 kg), last menstrual period 03/13/2012,  SpO2 93 %. General: Chronically ill appearing WF in no acute distress. Head: Normocephalic, atraumatic, sclera non-icteric, no xanthomas, nares are without discharge. Neck: Supple. JVP not elevated. Lungs: Mechanical BS bilaterally on vent. No wheezes, rales, or rhonchi. Breathing is unlabored. Heart: RRR with frequent bursts of ectopy, S1 S2 without murmurs, rubs, or gallops.  Abdomen: Soft, non-tender, non-distended with normoactive bowel sounds. No rebound/guarding. Extremities: No clubbing or cyanosis. No edema. Distal pedal pulses are 2+ and equal bilaterally. Neuro: Arousable to voice and touch but falls back asleep easily.   Intake/Output Summary (Last 24 hours) at 03/16/15 0939 Last data filed at 03/16/15 0540  Gross per 24 hour  Intake    640 ml  Output   1902 ml  Net  -1262 ml    Inpatient Medications:  . aspirin  81 mg Oral Daily  . atorvastatin  80 mg Oral Daily  . calcium carbonate  1 tablet Oral TID WC  . carvedilol  15.625 mg Oral BID WC  . clopidogrel  75 mg Oral Daily  . furosemide  40 mg Oral Daily  . guaiFENesin  600 mg Oral BID  . insulin aspart  0-5 Units Subcutaneous QHS  . insulin aspart  0-9 Units Subcutaneous TID WC  . levofloxacin (LEVAQUIN) IV  750 mg Intravenous Q24H  . levothyroxine  150 mcg Oral QAC breakfast  . midazolam      . mirtazapine  7.5 mg Oral QHS  . pantoprazole (PROTONIX) IV  40 mg Intravenous Q24H  . sodium chloride  1,000 mL Intravenous Once  . sodium chloride flush  10-40 mL Intracatheter Q12H   Infusions:  . fentaNYL infusion INTRAVENOUS    .  midazolam (VERSED) infusion      Labs:  Recent Labs  03/14/15 0500  03/16/15 0535 03/16/15 0825  NA 142  < > 144 142  K 3.3*  < > 4.0 4.7  CL 107  < > 103 104  CO2 27  < > 25 21*  GLUCOSE 116*  < > 147* 198*  BUN 22*  < > 19 20  CREATININE 0.82  < > 0.97 1.15*  CALCIUM 7.5*  < > 7.9* 7.6*  MG 1.2*  --  1.5*  --   < > = values in this interval not displayed.  Recent Labs   03/15/15 0530 03/16/15 0825  AST 47* 58*  ALT 7* 8*  ALKPHOS 141* 150*  BILITOT 0.9 0.9  PROT 4.9* 5.3*  ALBUMIN 2.3* 2.3*    Recent Labs  03/16/15 0535 03/16/15 0825  WBC 9.2 16.7*  NEUTROABS  --  9.7*  HGB 8.4* 8.6*  HCT 27.2* 28.6*  MCV 92.8 95.7  PLT 141* 151    Recent Labs  03/16/15 0825  TROPONINI 0.12*   Invalid input(s): POCBNP No results for input(s): HGBA1C in the last 72 hours.   Radiology/Studies:  Dg Chest 2 View  03/02/2015  CLINICAL DATA:  Shortness of breath, cough and congestion since Sunday, history coronary artery disease, chronic renal insufficiency, coronary artery disease, hypertension, type II diabetes mellitus, metastatic breast cancer EXAM: CHEST  2 VIEW COMPARISON:  09/21/2014 FINDINGS: RIGHT subclavian Port-A-Cath with tip projecting over SVC. Normal heart size, mediastinal contours, and pulmonary vascularity. Atherosclerotic calcification aorta. Eventration of the anterior RIGHT diaphragm. Increased markings at anterior RIGHT lung base question chronic atelectasis. Remaining lungs grossly clear. No pleural effusion or pneumothorax. Diffuse osteosclerosis again identified, patchy, likely related to known osseous metastatic disease. IMPRESSION: Chronic RIGHT basilar atelectasis. No definite acute pulmonary abnormalities. Diffuse osseous metastatic disease. Electronically Signed   By: Lavonia Dana M.D.   On: 03/02/2015 12:33   Ct Head Wo Contrast  03/16/2015  CLINICAL DATA:  Seizure, stage IV breast cancer. EXAM: CT HEAD WITHOUT CONTRAST TECHNIQUE: Contiguous axial images were obtained from the base of the skull through the vertex without intravenous contrast. COMPARISON:  CT scan of July 01, 2009. FINDINGS: Bilateral maxillary sinusitis is noted. Lucencies are noted throughout the skull which may represent metastatic disease. Minimal diffuse cortical atrophy is noted. Minimal chronic ischemic white matter disease is noted. No mass effect or midline shift is  noted. Ventricular size is within normal limits. There is no evidence of mass lesion, hemorrhage or acute infarction. IMPRESSION: Lucencies are noted throughout the skull concerning for metastatic disease as noted on prior exam. Minimal diffuse cortical atrophy and chronic ischemic white matter disease. No acute intracranial abnormality seen. Electronically Signed   By: Marijo Conception, M.D.   On: 03/16/2015 09:22   Ct Chest Wo Contrast  03/06/2015  CLINICAL DATA:  Dyspnea. Dizziness, fatigue, and upper respiratory symptoms. Tachycardia, tachypnea, and fever. Metastatic right breast cancer currently undergoing chemotherapy. EXAM: CT CHEST WITHOUT CONTRAST TECHNIQUE: Multidetector CT imaging of the chest was performed following the standard protocol without IV contrast. COMPARISON:  01/12/2015 FINDINGS: There is new diffuse body wall edema and on organized subcutaneous fluid, greatest in the right lateral chest wall and incompletely visualized. Sequelae of right mastectomy are again identified. A right subclavian Port-A-Cath remains in place and terminates at the cavoatrial junction. Diffuse aortic and coronary artery calcifications are noted. The heart is normal in size. No enlarged axillary, mediastinal, or hilar  lymph nodes are identified. Small left axillary lymph nodes measure up to 9 mm in short axis. Small mediastinal lymph nodes measure up to 8 mm in short axis. There are new, small bilateral pleural effusions with overlying dependent atelectasis and/or consolidation in the lower lobes. Emphysematous changes are greatest in the right upper lobe. There is new ground-glass and patchy consolidative opacity in the posterior right upper lobe. Small nodules along the right major fissure on the prior study are not well seen on this examination due to adjacent lung opacity and motion artifact. Subpleural reticulation is noted in the anterior right upper and right middle lobes related to prior radiation therapy. 5  mm left lower lobe nodule is unchanged (series 6, image 37). Small nodules along the left major fissure unchanged. Other small nodules seen on the prior study as well as plaque-like pleural thickening in the right lower lobe are obscured on the current examination by parenchymal consolidation/atelectasis and motion artifact. Small focus of residual opacity in the lingula on the prior study has resolved. The numerous liver lesions described on the prior study are not well seen on this noncontrast examination. Calcified atherosclerosis is noted in the upper abdomen. Sclerotic osseous metastases are again seen throughout the visualized skeleton with multiple nonacute rib fractures again noted. IMPRESSION: 1. Posterior right upper lobe opacity compatible with pneumonia. 2. Small bilateral pleural effusions with bilateral lower lobe atelectasis/consolidation. 3. Multiple previously described lung nodules are obscured on the current examination due to the above acute changes. The 5 mm left lower lobe nodule is unchanged. 4. Similar appearance of diffuse osseous metastases. Electronically Signed   By: Logan Bores M.D.   On: 03/06/2015 16:41   Ct Angio Chest Pe W/cm &/or Wo Cm  03/16/2015  CLINICAL DATA:  Post arrest, post intubation. Stage IV right breast cancer, unresponsive. EXAM: CT ANGIOGRAPHY CHEST WITH CONTRAST TECHNIQUE: Multidetector CT imaging of the chest was performed using the standard protocol during bolus administration of intravenous contrast. Multiplanar CT image reconstructions and MIPs were obtained to evaluate the vascular anatomy. CONTRAST:  150mL OMNIPAQUE IOHEXOL 350 MG/ML SOLN COMPARISON:  03/06/2015 FINDINGS: There is cardiomegaly. Aorta is normal caliber. Densely calcified coronary arteries diffusely. No filling defects in the pulmonary arteries to suggest pulmonary emboli. There are moderate bilateral pleural effusions, increasing since prior study. Probable compressive atelectasis in the  lower lobes bilaterally. Mild ground-glass opacities throughout the lungs could reflect mild edema. No mediastinal, hilar, or axillary adenopathy. Previously seen nodularity in the lungs not well visualized due to airspace disease and ground-glass opacities. Diffuse sclerotic metastases throughout the visualized bony thorax. Right Port-A-Cath is in place with the tip in the SVC. Previously described numerous liver lesions cannot be visualized on this chest CTA. Review of the MIP images confirms the above findings. IMPRESSION: No evidence of pulmonary embolus. Moderate bilateral pleural effusions, enlarging since prior study. Probable compressive atelectasis versus consolidation in the lower lobes. Cardiomegaly.  Mild ground-glass opacities may reflect early edema. Diffuse coronary artery disease. Diffuse sclerotic bony metastases. Electronically Signed   By: Rolm Baptise M.D.   On: 03/16/2015 09:37   Dg Chest Port 1 View  03/10/2015  CLINICAL DATA:  Pneumonia EXAM: PORTABLE CHEST 1 VIEW COMPARISON:  03/06/2015 FINDINGS: Cardiac shadow is stable in appearance. A right chest wall port is seen. Scattered patchy infiltrates are noted throughout both lungs similar to that seen on recent CT examination. Small bilateral pleural effusions right greater than left are again noted. Changes consistent with metastatic  disease in the bone are again noted. IMPRESSION: No change in patchy bilateral pneumonia. Bilateral pleural effusions are seen. Stable bony metastatic disease. Electronically Signed   By: Inez Catalina M.D.   On: 03/10/2015 12:48     Assessment and Plan  38F with CAD (cath 06/2013: CTO RCA, mod-sev oCx disease - PCI of Cx would be too complex so managed medically), HTN, HLD, metastatic breast CA to bone, anemia, CKD stage II, DM, carotid disease (s/p R CEA, L stenting). Admitted 03/02/2015 to [facility] with CAP and recent fall at home. Developed encephalopathy during admission felt 2/2 meds (phenergan, ativan,  morphine) and sepsis protocol re-initiated. Also developed tachy - NSR with freq PACs with PAT/MAT - no atrial fibrillation. Also noted to have weight gain and pleural effusions with 2D echo 03/10/15: EF 30-35%, akinesis of basal inferior myocardium, severe TR, mod MR, mildly dilated LA, PASP 10mmHg. (Previous echo 06/2013 EF 55-60%, mod MR.) On 03/16/15, developed seizure-like activity (in absence of arrhythmia) but subsequent post-ictal bradycardia noted with subsequent loss of pulse.   Principal Problem:   Sepsis ([place]) Active Problems:   Cardiomyopathy, ischemic   Severe mitral regurgitation   Acute systolic CHF (congestive heart failure) (HCC)   PAT (paroxysmal atrial tachycardia) (HCC)   Multifocal atrial tachycardia (HCC)   Demand ischemia of myocardium (HCC)   Hypothyroidism   Anasarca   Essential hypertension   Breast cancer metastasized to bone (HCC)   Mixed collagen vascular disease (HCC)   Diabetes mellitus with complication (HCC)   Anemia of chronic renal failure, stage 4 (severe) (HCC)   Pain from bone metastases (HCC)   Hypokalemia   Pressure ulcer   Elevated troponin   Premature atrial contractions   ESBL (extended spectrum beta-lactamase) producing bacteria infection   1. RUL PNA, ESBL UTI, and sepsis with acute encephalopathy (recurring 2/8, 2/14) - per IM/CCM.  2. Seizure activity followed by post-ictal bradycardia - reviewed case with Dr. Ellyn Hack. It does not appear that her seizure was triggered by any acute arrhythmia. Notes suggest she went into afib overnight but this is not correct - she has continued to remain in NSR with PAT/MAT as she has been doing the last week. There has been no atrial fib seen this admission. Per d/w MD, no indication for any new acute interventions from cardiology. Recommend further neurologic evaluation.  2. Acute combined systolic and diastolic CHF with severe mitral regurgitation, also component of volume overload from hypoalbuminemia and  fluid resuscitation - appears compensated on exam, but CTA does show moderate pleural effusions - question relationship to lung disease, breast CA or low albumin. Not on ACEI/ARB given recent AKI and hypotension.  3. CAD/elevated troponin - elevated troponin this admission felt c/w demand ischemia. Given metastatic breast cancer to bone and liver she is not felt to be a candidate for aggressive cardiac evaluation at present time. She remains on ASA + Plavix for medical therapy (Plavix is due to h/o carotid stenting, not because of prior coronary disease). Continue statin when taking orals reliably.  4. Tachycardia c/w sinus tach with PACs, MAT, and PAT - continue to follow. Will need to watch for further bradycardia.  MAT would be consistent with ongoing potential pulmonary etiology.  5. Abnormal TSH - per IM.  Appears to be hypothyroid. I would not be aggressive and repleting her thyroid hormone to avoid driving or tachycardia further.  6. CKD stage II-III with hyperkalemia/hypomagnesemia/hypocalcemia - lytes are being managed by IM.   7. Anemia/thrombocytopenia -  per IM.   Signed, Melina Copa PA-C Pager: [phone]  I am seeing the patient this afternoon after initially seen by  Melina Copa PA-C this morning.  Although not present when episode occurred this morning, and Hinton Dyer actually was there shortly after. She called me to discuss the patient and her recommendations are based on our discussion. I reviewed the telemetry strips. The patient has had sinus tachycardia with intermittent episodes of PAT/MAT, but no documented A. fib. MAT is irregularly irregular, but there are P waves noted.   We have titrated up beta blocker for this. This morning apparently she had an episode that appears to be a seizure. Reviewing her telemetry shows that she continued to have sinus tachycardia versus PAT with significant artifact during what was recorded as this seizure-like activity. Following this there appeared  to be what is most consistent with postictal pausing and agonal junctional escape rhythm that was probably when she was found to be pulseless. Carticel what caused this. It does not appear that the seizure-like activity was primarily cardiac in nature. She's currently having EEG leads placed.   Initially after resuscitation, she was noted to be bradycardic would look like ventricular escape beats. It almost appeared to be a sinus pause. I will review with our electrophysiology colleagues, but this does not appear to be an arrhythmogenic arrest.  I question if the postictal rhythm was more related to hypoxia mediated PEA as she was apparently not breathing. Of note she does have a bump in white count since her episode this morning.  Currently, she is intubated in the ICU. Chronically ill-appearing, but appears to be in a more stable rhythm. Keep magnesium and potassium as well as calcium as stable as possible.  We will need to see what the etiology of this recent event was. The telemetry strips would suggest that she had a jittery motion happening while in the tachycardic rhythm clearly followed. Then shortly after his when she had the bradycardia requiring CPR. At present, she does not have an acute requirement for pacemaker placement especially in the setting of ongoing infection.   Leonie Man, M.D., M.S. Interventional Cardiologist   Pager # [phone] Phone # [phone] [address] [place], [place] [address]

## 2015-03-16 NOTE — Progress Notes (Signed)
[id number] intubated and transferred to icu from mri after seizure/Rhonda Davis,BSN,RN,CCM

## 2015-03-16 NOTE — Procedures (Signed)
ELECTROENCEPHALOGRAM REPORT  Patient: Brittney Cunningham       Room #: [facility] [id number] EEG No. ID: [id number] Age: 75 y.o.        Sex: female Referring Physician: Calton Dach Report Date:  03/16/2015        Interpreting Physician: Anthony Sar  History: [name] is an 75 y.o. female with a history of stage IV breast cancer who experienced a cardiac arrest earlier today in the emergency department. Patient was intubated and is on mechanical ventilation. She exhibited seizure-like activity prior to developing cardiac arrest.  Indications for study:  Rule out encephalopathy; rule out seizure activity.  Technique: This is an 18 channel routine scalp EEG performed at the bedside with bipolar and monopolar montages arranged in accordance to the international 10/20 system of electrode placement.   Description: Patient was unresponsive to external stimuli at the time of this study. She was also sedated with fentanyl. Predominant activity consisted of moderate amplitude diffuse symmetrical mixed delta and theta activity with fairly frequent periods of generalized suppression of cerebral activity lasting 1-2 seconds. Pattern of activity was not a typical alternating burst suppression pattern, however. Photic stimulation was not performed. No epileptiform discharges were recorded.  Interpretation: This EEG is abnormal with moderate generalized continuous slowing of cerebral activity which is nonspecific and can be seen with toxic and metabolic encephalopathies, as well as with hypoxic brain injury. No evidence of epileptiform activity was recorded.   Rush Farmer M.D. [facility] [phone]

## 2015-03-16 NOTE — Progress Notes (Signed)
PT Cancellation Note  Patient Details Name: Brittney Cunningham MRN: [id number] DOB: 30-Jan-1941   Cancelled Treatment:    Reason Eval/Treat Not Completed: Medical issues which prohibited therapy (Code was called)  Marcelino Freestone PT [id number]  03/16/2015, 11:23 AM

## 2015-03-16 NOTE — Progress Notes (Signed)
[place] Progress Note Patient Name: Brittney Cunningham DOB: 1940/10/18 MRN: [id number]   Date of Service  03/16/2015  HPI/Events of Note  Family requests code status be changed back to DNR Chart reviewed, stage IV lung cancer  eICU Interventions  done     Intervention Category Major Interventions: Code management / supervision  Simonne Maffucci 03/16/2015, 8:02 PM

## 2015-03-16 NOTE — Progress Notes (Signed)
Fentanyl gtt 30cc wasted down sink. Witnessed by Raenette Rover RN/Usha Slager [facility] RN.

## 2015-03-16 NOTE — Progress Notes (Addendum)
Contraindication to Chlorhexidine oral protocol due to allergy. Sallye Ober NP made aware.

## 2015-03-16 NOTE — Progress Notes (Signed)
Son has spoken to this RN in room with the decision from the pt's husband, [name] to abide by the pt's wishes and make her a DNR. There was much discussion earlier today with the children of Mrs Blough with this RN concerning her code status. They came to this RN with questions about "DNR" and this RN answered all. The children wanted a DNR but was going to discuss this with their dad. He has now decided to abide by pt's wishes and make her a DNR. Dr Lake Bells called at [facility] to discuss this with the family. They are in the room waiting for a call back.

## 2015-03-16 NOTE — Progress Notes (Addendum)
Patient has been in A.fib but tachycardic.  EKG performed. Results were: A.Fib with Rapid Ventricular Response.  Vitals were: 98.3F;HR 139; RR 20; 143/79;94% RA.  PCP was notified.

## 2015-03-16 NOTE — Progress Notes (Signed)
OT Cancellation Note  Patient Details Name: Brittney Cunningham MRN: [id number] DOB: 07-Apr-1940   Cancelled Treatment:      Reason Eval/Treat Not Completed: Medical issues which prohibited therapy (Code was called; pt transferred to ICU)  Meher Kucinski A 03/16/2015, 11:43 AM

## 2015-03-16 NOTE — Care Management Important Message (Signed)
Important Message  Patient Details Important Message  Patient Details IM Letter given to Cookie?Case Manager to present to Patient Name: KEITLYN BONNER MRN: [id number] Date of Birth: Jun 11, 1940   Medicare Important Message Given:  Yes    Camillo Flaming 03/16/2015, 11:57 AM Name: SOPHEE KOCOUREK MRN: [id number] Date of Birth: 07-Mar-1940   Medicare Important Message Given:  Yes    Camillo Flaming 03/16/2015, 11:57 AM

## 2015-03-17 ENCOUNTER — Inpatient Hospital Stay (HOSPITAL_COMMUNITY): Payer: Medicare Other

## 2015-03-17 ENCOUNTER — Ambulatory Visit: Payer: Self-pay

## 2015-03-17 ENCOUNTER — Ambulatory Visit: Payer: Self-pay | Admitting: Nurse Practitioner

## 2015-03-17 ENCOUNTER — Other Ambulatory Visit: Payer: Self-pay

## 2015-03-17 DIAGNOSIS — J9601 Acute respiratory failure with hypoxia: Secondary | ICD-10-CM

## 2015-03-17 LAB — BASIC METABOLIC PANEL
ANION GAP: 11 (ref 5–15)
ANION GAP: 12 (ref 5–15)
BUN: 19 mg/dL (ref 6–20)
BUN: 19 mg/dL (ref 6–20)
CHLORIDE: 106 mmol/L (ref 101–111)
CHLORIDE: 108 mmol/L (ref 101–111)
CO2: 24 mmol/L (ref 22–32)
CO2: 25 mmol/L (ref 22–32)
Calcium: 7.2 mg/dL — ABNORMAL LOW (ref 8.9–10.3)
Calcium: 7.3 mg/dL — ABNORMAL LOW (ref 8.9–10.3)
Creatinine, Ser: 1.05 mg/dL — ABNORMAL HIGH (ref 0.44–1.00)
Creatinine, Ser: 1.12 mg/dL — ABNORMAL HIGH (ref 0.44–1.00)
GFR calc Af Amer: 55 mL/min — ABNORMAL LOW (ref >60)
GFR calc non Af Amer: 52 mL/min — ABNORMAL LOW (ref >60)
GFR, EST AFRICAN AMERICAN: 60 mL/min — AB (ref >60)
GFR, EST NON AFRICAN AMERICAN: 48 mL/min — AB (ref >60)
GLUCOSE: 126 mg/dL — AB (ref 65–99)
Glucose, Bld: 125 mg/dL — ABNORMAL HIGH (ref 65–99)
POTASSIUM: 3.9 mmol/L (ref 3.5–5.1)
Potassium: 3.9 mmol/L (ref 3.5–5.1)
Sodium: 142 mmol/L (ref 135–145)
Sodium: 144 mmol/L (ref 135–145)

## 2015-03-17 LAB — CBC
HEMATOCRIT: 24.3 % — AB (ref 36.0–46.0)
HEMOGLOBIN: 7.4 g/dL — AB (ref 12.0–15.0)
MCH: 29 pg (ref 26.0–34.0)
MCHC: 30.5 g/dL (ref 30.0–36.0)
MCV: 95.3 fL (ref 78.0–100.0)
Platelets: 128 10*3/uL — ABNORMAL LOW (ref 150–400)
RBC: 2.55 MIL/uL — ABNORMAL LOW (ref 3.87–5.11)
RDW: 17.1 % — ABNORMAL HIGH (ref 11.5–15.5)
WBC: 7.6 10*3/uL (ref 4.0–10.5)

## 2015-03-17 LAB — GLUCOSE, CAPILLARY
GLUCOSE-CAPILLARY: 106 mg/dL — AB (ref 65–99)
GLUCOSE-CAPILLARY: 112 mg/dL — AB (ref 65–99)
Glucose-Capillary: 106 mg/dL — ABNORMAL HIGH (ref 65–99)
Glucose-Capillary: 113 mg/dL — ABNORMAL HIGH (ref 65–99)
Glucose-Capillary: 93 mg/dL (ref 65–99)

## 2015-03-17 LAB — URINE CULTURE: CULTURE: NO GROWTH

## 2015-03-17 LAB — MAGNESIUM
MAGNESIUM: 1.9 mg/dL (ref 1.7–2.4)
Magnesium: 1.9 mg/dL (ref 1.7–2.4)

## 2015-03-17 MED ORDER — GADOBENATE DIMEGLUMINE 529 MG/ML IV SOLN
20.0000 mL | Freq: Once | INTRAVENOUS | Status: AC | PRN
  Administered 2015-03-17: 17 mL via INTRAVENOUS

## 2015-03-17 MED ORDER — LORAZEPAM 2 MG/ML IJ SOLN: 1.0000 mg | INTRAMUSCULAR | Status: DC | PRN

## 2015-03-17 MED ORDER — LORAZEPAM 2 MG/ML IJ SOLN
1.0000 mg | INTRAMUSCULAR | Status: DC | PRN
Start: 2015-03-17 — End: 2015-03-21
  Administered 2015-03-20: 2 mg via INTRAVENOUS
  Filled 2015-03-17 (×2): qty 1

## 2015-03-17 NOTE — Progress Notes (Signed)
Subjective: Patient remains intubated on mechanical ventilation. No further seizure activity reported. Patient was interactive and responding appropriately earlier today, but was sedated to have MRI study completed.  Objective: Current vital signs: BP 85/31 mmHg  Pulse 57  Temp(Src) 96.4 F (35.8 C) (Axillary)  Resp 16  Ht 5\' 3"  (1.6 m)  Wt 78.3 kg (172 lb 9.9 oz)  BMI 30.59 kg/m2  SpO2 100%  LMP 03/13/2012  Neurologic Exam: Somnolent and unable to arouse. She appear to be in no distress. Pupils were equal and reacted normally to light. Extraocular movements were intact with oculocephalic maneuvers. Face was symmetrical. Muscle tone was flaccid throughout. She had no abnormal posturing and no spontaneous movements of extremities.  MRI with contrast showed no signs of an acute parenchymal lesion, including no evidence of brain metastasis. Extensive metastasis to skull and possibly dura was noted, particularly in the frontal region.  Medications: I have reviewed the patient's current medications.  Assessment/Plan: 75 year old lady with stage IV metastatic breast cancer admitted for sepsis and pneumonia, who experienced cardiac arrest as well as new onset seizure activity on 03/16/2015. Etiology for new onset seizure activity is unclear. MRI was unremarkable for acute stroke as well as unremarkable for tumor metastasis. She has had no recurrence of seizure activity since treatment with Keppra was started. EEG showed generalized slowing with no seizure activity.  No changes in current management recommended. I will plan to see this patient in follow-up on an as-needed basis following this visit.  C.R. Nicole Kindred, MD [facility] [phone]  03/17/2015  1:04 PM

## 2015-03-17 NOTE — Progress Notes (Signed)
OT Cancellation Note  Patient Details Name: Brittney Cunningham MRN: [id number] DOB: 1940-11-25   Cancelled Treatment:    Reason Eval/Treat Not Completed: Medical issues which prohibited therapy. Will sign off at this time. Pt is on a vent.  If pt improves and would benefit from OT later, please reorder.  Brittney Cunningham 03/17/2015, 7:23 AM  Lesle Chris, OTR/L [phone] 03/17/2015

## 2015-03-17 NOTE — Progress Notes (Signed)
   Remains hemodynamically stable on current BB dose. No significant arrhythmias  Compensated HF - no additional IV diuresis required.  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  Will monitor from chart & be available for assistance   Will sign off for now.  Leonie Man, MD

## 2015-03-17 NOTE — Progress Notes (Signed)
PULMONARY / CRITICAL CARE MEDICINE   Name: Brittney Cunningham MRN: [id number] DOB: 11/08/1940    ADMISSION DATE:  03/02/2015 CONSULTATION DATE:  03/10/15  REFERRING MD:  Brittney Cunningham / [facility]   CHIEF COMPLAINT:  AMS  HISTORY OF PRESENT ILLNESS:   Brittney Cunningham is a 75 y/o female with PMH of HTN, hypothyroidism, CAD, OSA (? If uses CPAP),CKD and stage IV right breast cancer (dx 05/2009, s/p mastectomy) with mets to the bone and liver s/p neoadjuvant chemotherapy and radiation seen by Brittney Cunningham currently on regimen of eribulin (last dose 02/24/15) and Neulasta who presented to [facility] on 1/31 with URI symptoms and worsening fatigue.   Upon arrival to the ED she was found to be tachycardic and tachypneic with a fever of 100.8, no leukocytosis however she was recently neutropenic secondary to Neulasta. Initial labwork revealed Acute on CKD with creatinine 1.48 (baseline sr cr ~ 1.2), NA 132, K 4.0, hemoglobin 8.9, WBC 6. Initial CXR with atelectasis but no definite infiltrate. Patient was admitted for sepsis likely due to HCAP and was started on empiric antibiotics at that time.   Subsequently, urine cultures came back concerning for ESBL UTI with 40k colonies. Chest CT on 2/4 also revealed worsening RUL infiltrate compatible with pneumonia as well as small bilateral pleural effusions. Current antibiotic regimen includes Azithromycin and Rocephin since 2/4 (changed from Vanc and Zosyn on admission).   Overnight on 2/8 rapid response was called to patient's bedside due to patient becoming increasingly agitated, tachycardic into the 140s and decreased level of consciousness. She was given IV phenergan for nausea prior to this and IV ativan during this episode. Patient received 5 mg of Lopressor and 500 cc NS bolus at that time with some improvement in tachycardia. Patient remained lethargic and confused in AM 2/8, order placed to transfer to SDU and PCCM was consulted.   STUDIES:  CT chest 2/4:  Posterior RUL opacity compatible with PNA. Small bilateral pleural effusion. Multiple previously described lung nodules obscured by acute changes.  EEG 2/14:  Moderate generalized slowing. No epileptiform activity. CTA Chest 2/14:  Cardiomegaly w/ moderate bilateral pleural effusions. No PE. GGO. CT Head w/o 2/14:  Skull mets noted. No acute intracranial abnormality. Port CXR 2/15:  ETT in good position. Bilateral lower lung opacities consistent with effusion versus atelectasis. MRI 2/15>>>  CULTURES: Urine 1/31: ESBL E. Coli 40,000 colonies  Blood 1/31: Negative   ANTIBIOTICS: Azithromycin 2/4 -2/8  Rocephin 2/4 - 2/8  Vancomycin 1/31 - 2/4  Zosyn 1/31 - 2/4  Primaxin 2/3 - 2/4   SIGNIFICANT EVENTS: 1/31 - Admitted for sepsis likely due to HCAP  2/08 - Rapid response initiated for AMS, lethargy, transferred to SDU, PCCM consulted  2/14 - PEA arrest w/ brief code 2/2 seizure - Second seizure in MRI tonic clonic  LINES/TUBES: OETT 7.5 2/14>>> OGT 2/14>>> Port>>> PIV   SUBJECTIVE:  Patient intubated yesterday after PEA arrest. Patient had second tonic-clonic seizure in MRI yesterday. Code status changed to DNR overnight.  REVIEW OF SYSTEMS:  Unable to obtain with patient intubated.  VITAL SIGNS: BP 137/56 mmHg  Pulse 71  Temp(Src) 98.3 F (36.8 C) (Axillary)  Resp 24  Ht 5\' 3"  (1.6 m)  Wt 172 lb 9.9 oz (78.3 kg)  BMI 30.59 kg/m2  SpO2 100%  LMP 03/13/2012  HEMODYNAMICS:    VENTILATOR SETTINGS: Vent Mode:  [-] PRVC FiO2 (%):  [30 %-100 %] 30 % Set Rate:  [14 bmp-16 bmp] 16  bmp Vt Set:  [400 mL] 400 mL PEEP:  [5 cmH20] 5 cmH20 Plateau Pressure:  [16 cmH20-18 cmH20] 17 cmH20  INTAKE / OUTPUT: I/O last 3 completed shifts: In: 2434.2 [P.O.:240; I.V.:784.2; IV Piggyback:1410] Out: 800 [Urine:800]  PHYSICAL EXAMINATION: General: Female. No distress. Awake.  Neuro: Attends to voice. Moving all 4 extremities equally. Following commands.  HEENT: ETT in  place. No scleral injection. Cardiovascular: Regular rate. No JVD. Bilateral edema. Lungs: Decreased BS bilateral lung bases. Symmetric chest rise on ventilator. Abdomen: Soft. Nondistended. Normal bowel sounds. Skin: No rashes. Warm & dry.  LABS:  BMET  Recent Labs Lab 03/16/15 0825 03/17/15 0026 03/17/15 0452  NA 142 142 144  K 4.7 3.9 3.9  CL 104 106 108  CO2 21* 24 25  BUN 20 19 19   CREATININE 1.15* 1.05* 1.12*  GLUCOSE 198* 125* 126*    Electrolytes  Recent Labs Lab 03/16/15 0535 03/16/15 0825 03/17/15 0026 03/17/15 0452  CALCIUM 7.9* 7.6* 7.2* 7.3*  MG 1.5*  --  1.9 1.9    CBC  Recent Labs Lab 03/16/15 0535 03/16/15 0825 03/17/15 0452  WBC 9.2 16.7* 7.6  HGB 8.4* 8.6* 7.4*  HCT 27.2* 28.6* 24.3*  PLT 141* 151 128*    Coag's  Recent Labs Lab 03/16/15 0825  INR 1.35    Sepsis Markers  Recent Labs Lab 03/10/15 0755  03/10/15 1800 03/16/15 0825 03/16/15 1300  LATICACIDVEN 3.6*  < > 2.8* 7.2* 2.4*  PROCALCITON 0.39  --   --   --   --   < > = values in this interval not displayed.  ABG  Recent Labs Lab 03/10/15 1013 03/16/15 0829  PHART 7.341* 7.338*  PCO2ART 27.1* 46.7*  PO2ART 79.2* 436*    Liver Enzymes  Recent Labs Lab 03/14/15 0500 03/15/15 0530 03/16/15 0825  AST 65* 47* 58*  ALT 9* 7* 8*  ALKPHOS 135* 141* 150*  BILITOT 0.7 0.9 0.9  ALBUMIN 2.1* 2.3* 2.3*    Cardiac Enzymes  Recent Labs Lab 03/10/15 0755 03/10/15 1410 03/16/15 0825  TROPONINI 0.16* 0.25* 0.12*    Glucose  Recent Labs Lab 03/16/15 0751 03/16/15 0812 03/16/15 1221 03/16/15 1641 03/16/15 1944 03/17/15 0450  GLUCAP 138* 166* 120* 115* 127* 106*    Imaging Ct Head Wo Contrast  03/16/2015  CLINICAL DATA:  Seizure, stage IV breast cancer. EXAM: CT HEAD WITHOUT CONTRAST TECHNIQUE: Contiguous axial images were obtained from the base of the skull through the vertex without intravenous contrast. COMPARISON:  CT scan of July 01, 2009.  FINDINGS: Bilateral maxillary sinusitis is noted. Lucencies are noted throughout the skull which may represent metastatic disease. Minimal diffuse cortical atrophy is noted. Minimal chronic ischemic white matter disease is noted. No mass effect or midline shift is noted. Ventricular size is within normal limits. There is no evidence of mass lesion, hemorrhage or acute infarction. IMPRESSION: Lucencies are noted throughout the skull concerning for metastatic disease as noted on prior exam. Minimal diffuse cortical atrophy and chronic ischemic white matter disease. No acute intracranial abnormality seen. Electronically Signed   By: Marijo Conception, M.D.   On: 03/16/2015 09:22   Ct Angio Chest Pe W/cm &/or Wo Cm  03/16/2015  CLINICAL DATA:  Post arrest, post intubation. Stage IV right breast cancer, unresponsive. EXAM: CT ANGIOGRAPHY CHEST WITH CONTRAST TECHNIQUE: Multidetector CT imaging of the chest was performed using the standard protocol during bolus administration of intravenous contrast. Multiplanar CT image reconstructions and MIPs were obtained to  evaluate the vascular anatomy. CONTRAST:  190mL OMNIPAQUE IOHEXOL 350 MG/ML SOLN COMPARISON:  03/06/2015 FINDINGS: There is cardiomegaly. Aorta is normal caliber. Densely calcified coronary arteries diffusely. No filling defects in the pulmonary arteries to suggest pulmonary emboli. There are moderate bilateral pleural effusions, increasing since prior study. Probable compressive atelectasis in the lower lobes bilaterally. Mild ground-glass opacities throughout the lungs could reflect mild edema. No mediastinal, hilar, or axillary adenopathy. Previously seen nodularity in the lungs not well visualized due to airspace disease and ground-glass opacities. Diffuse sclerotic metastases throughout the visualized bony thorax. Right Port-A-Cath is in place with the tip in the SVC. Previously described numerous liver lesions cannot be visualized on this chest CTA. Review  of the MIP images confirms the above findings. IMPRESSION: No evidence of pulmonary embolus. Moderate bilateral pleural effusions, enlarging since prior study. Probable compressive atelectasis versus consolidation in the lower lobes. Cardiomegaly.  Mild ground-glass opacities may reflect early edema. Diffuse coronary artery disease. Diffuse sclerotic bony metastases. Electronically Signed   By: Rolm Baptise M.D.   On: 03/16/2015 09:37   Mr Brain Wo Contrast  03/16/2015  CLINICAL DATA:  Metastatic breast cancer with possible seizure. EXAM: MRI HEAD WITHOUT CONTRAST TECHNIQUE: Multiplanar, multiecho pulse sequences of the brain and surrounding structures were obtained without intravenous contrast. COMPARISON:  None. FINDINGS: Partial study (only sagittal T1 and diffusion imaging obtained) due to seizure during image acquisition. The available images are motion degraded. There is no evidence of infarct, hydrocephalus, or shift. Diffuse marrow hypo intensity correlating with history of widespread osseous metastases. Chronic sinusitis with diffuse mucosal thickening in the maxillary sinuses. IMPRESSION: 1. Incomplete study due to seizure. 2. Negative for acute infarct. Electronically Signed   By: Monte Fantasia M.D.   On: 03/16/2015 15:54   Dg Chest Port 1 View  03/17/2015  CLINICAL DATA:  Respiratory failure EXAM: PORTABLE CHEST 1 VIEW COMPARISON:  Portable chest x-ray of March 16, 2015 FINDINGS: The lungs are well-expanded. The hemidiaphragms remain obscured. The interstitial markings remain increased. The cardiac silhouette remains enlarged. The pulmonary vascularity is not engorged. The endotracheal tube tip lies approximately 3.7 cm above the carina. The esophagogastric tube tip projects below the inferior margin of the image. The Port-A-Cath appliance tip projects over the midportion of the SVC. IMPRESSION: Allowing for differences in positioning there has not been significant interval change since the  previous study. Persistent bibasilar atelectasis or pneumonia with small bilateral pleural effusions. Stable cardiomegaly without significant pulmonary vascular congestion. Electronically Signed   By: David  Martinique M.D.   On: 03/17/2015 07:22   Dg Chest Port 1 View  03/16/2015  CLINICAL DATA:  Could in this morning.  Evaluate ET tube placement EXAM: PORTABLE CHEST 1 VIEW COMPARISON:  03/10/2015 FINDINGS: Endotracheal tube is 3.6 cm above the carina. Right Port-A-Cath tip at the cavoatrial junction. Heart is upper limits normal in size. Bilateral lower lobe airspace opacities and probable layering effusions again noted, similar prior study. IMPRESSION: Bilateral lower lobe airspace opacities and small layering effusions, similar to prior study. Endotracheal tube 3.6 cm above the carina. Electronically Signed   By: Rolm Baptise M.D.   On: 03/16/2015 10:18   Dg Abd Portable 1v  03/17/2015  CLINICAL DATA:  Orogastric tube placement. EXAM: PORTABLE ABDOMEN - 1 VIEW COMPARISON:  CT abdomen and pelvis 04/13/2012.  Abdomen 10/26/2011. FINDINGS: Enteric tube tip is projected over the lower mid abdomen consistent with location in the distal stomach. Scattered gas in the colon without small or  large bowel distention. Diffuse heterogeneous bone sclerosis and lucency consistent with diffuse bone metastasis. Residual contrast material noted in the bladder. IMPRESSION: Enteric tube tip projects over the lower mid abdomen, likely in the distal stomach. Diffuse skeletal metastases. Electronically Signed   By: Lucienne Capers M.D.   On: 03/17/2015 01:01    ASSESSMENT / PLAN:  NEUROLOGIC A:  Seizure H/O Anxiety/Depression  P:  RASS goal: 0 to -1 Neurology Following Keppra IV Re-order MRI brain Ativan IV prn seizure & sedation Fentanyl gtt  PULMONARY A: Acute Hypercarbic Respiratory Failure - Resolved. Secondary to seizure. Bilateral Pleural Effusions Pulmonary Nodules - Question metastatic  cancer. Possible OSA  P:  Vent support SBT once patient returns from MRI  CARDIOVASCULAR A:  MAT Elevated Troponin I - Demand ischemia. H/O HTN H/O Hyperlipidemia H/O CAD H/O PVD - S/P Carotid Stent.  P:  Telemetry monitoring  Lasix PO daily Lipitor, ASA, Coreg, Plavix  RENAL A:  No acute issues. H/O CKD  P:  Monitoring UOP Trending renal function daily w/ BUN/Creatinine Electrolyte panel daily  GASTROINTESTINAL A:  H/O GERD  P:  NPO  Protonix IV daily  HEMATOLOGIC A:  Stage IV Breast Cancer - Mets to Liver & Skull. [facility]ney [facility] following. Anemia - Normocytic. Likely chronic disease. Thrombocytopenia - Mild. Stable.   P:  Trending cell counts daily w/ CBC Transfuse per ICU protocol as indicated  SCDs Holding chemical DVT prophylaxis awaiting MRI  INFECTIOUS A:  No acute issue.  P:  Monitoring for fever & leukocytosis  ENDOCRINE A:  DM Type II - BG controlled.  H/O Hypothyroidism   P:  Synthroid SSI per moderate algorithm Accu-Checks q4hr  FAMILY  - Updates: No family present at bedside 2/15. Family updated 2/14 by Dr. Ashok Cordia.  - Inter-disciplinary family meet or Palliative Care meeting due by: 03/17/2015  TODAY'S SUMMARY:  Ms. Otey is a 75 y/o female with complicated PMH including Stage IV right breast cancer currently on Eribulin/Neulasta with Brittney Cunningham who presented 1/31 with sepsis like secondary to HCAP. Subsequently found to have RUL infiltrate and ESBL UTI, currently on zithromax and rocephin. Patient had spontaneous seizures yesterday resulting in a brief arrest. Mental status seems to be in tact. Started on Keppra. Repeating MRI of the brain to examine for intracranial lesions.  I have spent a total of 37 minutes of critical care time today caring for the patient and reviewing the patient's electronic medical record.  Sonia Baller Ashok Cordia, M.D. [facility] Pulmonary & Critical Care Pager:   [phone] After 3pm or if no response, call [phone]  03/17/2015, 7:41 AM

## 2015-03-17 NOTE — Progress Notes (Signed)
Physical Therapy Discharge Patient Details Name: Brittney Cunningham MRN: [id number] DOB: November 04, 1940 Today's Date: 03/17/2015 Time:  -     Patient discharged from PT services secondary to medical decline - will need to re-order PT to resume therapy services.On Vent, DNR status.  Please see latest therapy progress note for current level of functioning and progress toward goals.     GP     Brittney Cunningham PT [phone]  03/17/2015, 7:17 AM

## 2015-03-17 NOTE — Progress Notes (Signed)
Date: March 17, 2015 Chart reviewed for concurrent status and case management needs. Will continue to follow patient for changes and needs: full vent support fi02 at 30-50% Velva Harman, BSN, Therapist, sports, [place]

## 2015-03-17 NOTE — Progress Notes (Addendum)
Nutrition Follow-up  DOCUMENTATION CODES:   Not applicable  INTERVENTION:  -RD to continue to monitor for needs, goals of care  NUTRITION DIAGNOSIS:   Inadequate oral intake related to poor appetite as evidenced by per patient/family report.  ongoing  GOAL:   Patient will meet greater than or equal to 90% of their needs  Not meeting  MONITOR:   PO intake, Supplement acceptance, Weight trends, Labs, Skin, I & O's  REASON FOR ASSESSMENT:   Malnutrition Screening Tool    ASSESSMENT:   Brittney Cunningham is an 75 y.o. female with a PMH of stage IV right breast cancer diagnosed May/2011 with metastasis to the bone and liver status post neoadjuvant therapy, right modified radical mastectomy 03/2010  Patient is currently intubated on ventilator support MV: 6.79mn Temp (24hrs), Avg:97.9 F (36.6 C), Min:96.4 F (35.8 C), Max:99.1 F (37.3 C)  Propofol: None  Ms. CReeserwas intubated yesterday after PEA arrest. She had a seizure during MRI. Code status has become a DNR at this point. Per CCM note, pt will undergo SBT today.  Labs: CBGs 106-117, Cr 1.12, Ca 7.3, EGFR 48, Phos 2.1, Alk Phos 150  Medications: Fentanyl Drip  Diet Order:  Diet NPO time specified  Skin:  Wound (see comment) (Stage 2 sacral pressure ulcer)  Last BM:  2/5  Height:   Ht Readings from Last 1 Encounters:  03/16/15 _0  (1.6 m)    Weight:   Wt Readings from Last 1 Encounters:  03/17/15 172 lb 9.9 oz (78.3 kg)    Ideal Body Weight:  52.27 kg  BMI:  Body mass index is 30.59 kg/(m^2).  Estimated Nutritional Needs:   Kcal:  1150 calories  Protein:  85-110 grams  Fluid:  >/= 1.2L  EDUCATION NEEDS:   No education needs identified at this time  Brittney Anis Ameris Akamine, MS, RD LDN After Hours/Weekend Pager [phone]

## 2015-03-18 LAB — GLUCOSE, CAPILLARY
GLUCOSE-CAPILLARY: 85 mg/dL (ref 65–99)
GLUCOSE-CAPILLARY: 85 mg/dL (ref 65–99)
GLUCOSE-CAPILLARY: 88 mg/dL (ref 65–99)
Glucose-Capillary: 73 mg/dL (ref 65–99)
Glucose-Capillary: 77 mg/dL (ref 65–99)
Glucose-Capillary: 82 mg/dL (ref 65–99)

## 2015-03-18 LAB — CBC WITH DIFFERENTIAL/PLATELET
Basophils Absolute: 0 10*3/uL (ref 0.0–0.1)
Basophils Relative: 0 %
EOS ABS: 0 10*3/uL (ref 0.0–0.7)
EOS PCT: 0 %
HCT: 25 % — ABNORMAL LOW (ref 36.0–46.0)
Hemoglobin: 7.5 g/dL — ABNORMAL LOW (ref 12.0–15.0)
LYMPHS ABS: 1 10*3/uL (ref 0.7–4.0)
Lymphocytes Relative: 13 %
MCH: 28.6 pg (ref 26.0–34.0)
MCHC: 30 g/dL (ref 30.0–36.0)
MCV: 95.4 fL (ref 78.0–100.0)
MONOS PCT: 8 %
Monocytes Absolute: 0.6 10*3/uL (ref 0.1–1.0)
Neutro Abs: 6.1 10*3/uL (ref 1.7–7.7)
Neutrophils Relative %: 79 %
PLATELETS: 130 10*3/uL — AB (ref 150–400)
RBC: 2.62 MIL/uL — ABNORMAL LOW (ref 3.87–5.11)
RDW: 17.1 % — ABNORMAL HIGH (ref 11.5–15.5)
WBC: 7.7 10*3/uL (ref 4.0–10.5)

## 2015-03-18 LAB — RENAL FUNCTION PANEL
Albumin: 2.4 g/dL — ABNORMAL LOW (ref 3.5–5.0)
Anion gap: 15 (ref 5–15)
BUN: 22 mg/dL — AB (ref 6–20)
CALCIUM: 7.5 mg/dL — AB (ref 8.9–10.3)
CHLORIDE: 105 mmol/L (ref 101–111)
CO2: 22 mmol/L (ref 22–32)
CREATININE: 1 mg/dL (ref 0.44–1.00)
GFR calc Af Amer: >60 mL/min (ref >60)
GFR, EST NON AFRICAN AMERICAN: 55 mL/min — AB (ref >60)
Glucose, Bld: 85 mg/dL (ref 65–99)
Phosphorus: 4.2 mg/dL (ref 2.5–4.6)
Potassium: 3.8 mmol/L (ref 3.5–5.1)
SODIUM: 142 mmol/L (ref 135–145)

## 2015-03-18 LAB — MAGNESIUM: MAGNESIUM: 1.8 mg/dL (ref 1.7–2.4)

## 2015-03-18 MED ORDER — PHENOL 1.4 % MT LIQD
1.0000 | OROMUCOSAL | Status: DC | PRN
  Filled 2015-03-18: qty 177

## 2015-03-18 NOTE — Progress Notes (Signed)
PULMONARY / CRITICAL CARE MEDICINE   Name: Brittney Cunningham MRN: [id number] DOB: 1940/10/09    ADMISSION DATE:  03/02/2015 CONSULTATION DATE:  03/10/15  REFERRING MD:  Dr. Doyle Askew / [facility]   CHIEF COMPLAINT:  AMS    SUBJECTIVE:  Patient awake, alert.  Weaning on 5/5 with no distress.  Family indicate they would want reintubation if felt to be a short term reversible process.     REVIEW OF SYSTEMS:  Unable to obtain with patient intubated.  VITAL SIGNS: BP 137/68 mmHg  Pulse 70  Temp(Src) 97.5 F (36.4 C) (Core (Comment))  Resp 22  Ht 5\' 3"  (1.6 m)  Wt 177 lb 14.6 oz (80.7 kg)  BMI 31.52 kg/m2  SpO2 100%  LMP 03/13/2012  HEMODYNAMICS:    VENTILATOR SETTINGS: Vent Mode:  [-] PSV;CPAP FiO2 (%):  [30 %] 30 % Set Rate:  [16 bmp] 16 bmp Vt Set:  [400 mL] 400 mL PEEP:  [5 cmH20] 5 cmH20 Delta P (Amplitude):  [7] 7 Pressure Support:  [5 cmH20] 5 cmH20 Plateau Pressure:  [17 cmH20-19 cmH20] 17 cmH20  INTAKE / OUTPUT: I/O last 3 completed shifts: In: 1121.9 [I.V.:791.9; IV Piggyback:330] Out: [id number] [Urine:1515]  PHYSICAL EXAMINATION: General: Female. No distress. Awake.  Neuro: Follows commands, appears appropriate. Moving all 4 extremities equally.  HEENT: ETT in place. No scleral injection. Cardiovascular: Regular rate. No JVD. Bilateral edema. Lungs: Decreased BS bilateral lung bases. Symmetric chest rise on ventilator.  Pulling 500+ Vt on 5/5 Abdomen: Soft. Nondistended. Normal bowel sounds. Skin: No rashes. Warm & dry.  LABS:  BMET  Recent Labs Lab 03/17/15 0026 03/17/15 0452 03/18/15 0322  NA 142 144 142  K 3.9 3.9 3.8  CL 106 108 105  CO2 24 25 22   BUN 19 19 22*  CREATININE 1.05* 1.12* 1.00  GLUCOSE 125* 126* 85    Electrolytes  Recent Labs Lab 03/17/15 0026 03/17/15 0452 03/18/15 0322  CALCIUM 7.2* 7.3* 7.5*  MG 1.9 1.9 1.8  PHOS  --   --  4.2    CBC  Recent Labs Lab 03/16/15 0825 03/17/15 0452 03/18/15 0322  WBC 16.7* 7.6 7.7   HGB 8.6* 7.4* 7.5*  HCT 28.6* 24.3* 25.0*  PLT 151 128* 130*    Coag's  Recent Labs Lab 03/16/15 0825  INR 1.35    Sepsis Markers  Recent Labs Lab 03/16/15 0825 03/16/15 1300  LATICACIDVEN 7.2* 2.4*    ABG  Recent Labs Lab 03/16/15 0829  PHART 7.338*  PCO2ART 46.7*  PO2ART 436*    Liver Enzymes  Recent Labs Lab 03/14/15 0500 03/15/15 0530 03/16/15 0825 03/18/15 0322  AST 65* 47* 58*  --   ALT 9* 7* 8*  --   ALKPHOS 135* 141* 150*  --   BILITOT 0.7 0.9 0.9  --   ALBUMIN 2.1* 2.3* 2.3* 2.4*    Cardiac Enzymes  Recent Labs Lab 03/16/15 0825  TROPONINI 0.12*    Glucose  Recent Labs Lab 03/17/15 1136 03/17/15 1627 03/17/15 1946 03/17/15 2344 03/18/15 0349 03/18/15 0803  GLUCAP 106* 112* 93 88 73 77    Imaging Mr Brain W Wo Contrast  03/17/2015  ADDENDUM REPORT: 03/17/2015 11:44 ADDENDUM: 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 Electronically Signed   By: Logan Bores M.D.   On: 03/17/2015 11:44  03/17/2015  CLINICAL DATA:  Possible seizure. History of metastatic breast cancer. EXAM: MRI HEAD WITHOUT AND WITH CONTRAST TECHNIQUE: Multiplanar, multiecho pulse sequences of the brain and surrounding structures were  obtained without and with intravenous contrast. CONTRAST:  65mL MULTIHANCE GADOBENATE DIMEGLUMINE 529 MG/ML IV SOLN COMPARISON:  Incomplete brain MRI 03/16/2015.  Head CT 03/16/2015. FINDINGS: Dedicated thin section imaging through the temporal lobes demonstrates symmetric volume of the hippocampi without signal abnormality identified. There is no evidence of acute infarct, intracranial hemorrhage, mass, midline shift, or extra-axial fluid collection. There is mild generalized cerebral atrophy. T2 hyperintensities in the subcortical and periventricular white matter are compatible with mild chronic small vessel ischemic disease. No abnormal brain parenchymal enhancement or leptomeningeal enhancement is identified. There is mild diffuse pachymeningeal  enhancement bilaterally. This is largely thin and smooth in nature, however there is mild nodularity most notable in the right frontal region measuring 4-5 mm in thickness. There is moderate circumferential mucosal thickening in the maxillary sinuses with mild thickening in a posterior left ethmoid air cell. A trace left mastoid effusion is noted. Major intracranial vascular flow voids are preserved. Diffusely abnormal bone marrow signal is again noted and consistent with widespread osseous metastases. IMPRESSION: 1. No acute infarct. 2. No evidence of parenchymal brain metastases. 3. Diffuse pachymeningeal enhancement. This may represent a combination of reactive dural thickening related to widespread skull metastases as well as dural involvement by tumor, particularly in the right frontal region. 4. Mild chronic small vessel ischemic disease and cerebral atrophy. Electronically Signed: By: Logan Bores M.D. On: 03/17/2015 11:39    STUDIES:  CT chest 2/4: Posterior RUL opacity compatible with PNA. Small bilateral pleural effusion. Multiple previously described lung nodules obscured by acute changes.  EEG 2/14:  Moderate generalized slowing. No epileptiform activity. CTA Chest 2/14:  Cardiomegaly w/ moderate bilateral pleural effusions. No PE. GGO. CT Head w/o 2/14:  Skull mets noted. No acute intracranial abnormality. Port CXR 2/15:  ETT in good position. Bilateral lower lung opacities consistent with effusion versus atelectasis. MRI 2/15: no acute infarct, no evidence of acute parenchymal brain metastases, diffuse pachymeningeal ennhancement  CULTURES: Urine 1/31: ESBL E. 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 Secondary to seizure. RUL Infiltrate  Bilateral Pleural Effusions Pulmonary Nodules - Question metastatic cancer. 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 H/O HTN H/O Hyperlipidemia H/O CAD H/O PVD - S/P Carotid Stent. P:  Telemetry monitoring  Lasix PO daily Lipitor, ASA, Coreg, Plavix Ischemic work up to be decided upon as an outpatient once stabilized from acute issues Cardiology  available PRN as of 2/16 DNR   RENAL A:  Hx CKD P:  Monitoring UOP Trending renal function daily w/ BUN/Creatinine Electrolyte panel daily  GASTROINTESTINAL A:  H/O GERD P:  NPO x4 hours then advance diet as tolerated  Protonix IV daily  HEMATOLOGIC A:  Stage IV Breast Cancer - Mets to Liver & Skull. Dr. [name] following. Anemia - Normocytic. Likely chronic disease. Thrombocytopenia - Mild. Stable.  P:  Trending cell counts daily w/ CBC Transfuse per ICU protocol as indicated  SCDs Holding chemical DVT prophylaxis awaiting MRI  INFECTIOUS A:  RUL Infiltrate ESBL UTI P:  Monitoring for fever & leukocytosis ABX & cultures as above   ENDOCRINE A:   DM Type II - BG controlled.  H/O Hypothyroidism  P:  Synthroid SSI per moderate algorithm Accu-Checks q4hr    FAMILY  - Updates:  Updated 2/16 at bedside.  Family at bedside indicate if she needed ventilation again for a short term reversible process they would want but not prolonged support.    - Inter-disciplinary family meet or Palliative Care meeting due by: 03/17/2015   Noe Gens, NP-C [facility] Pgr: [phone] or if no answer [phone] 03/18/2015, 9:19 AM   PCCM Attending Note: Patient seen & examined with nurse practitioner. Please refer to her progress note which I have reviewed in detail. Ms. Sotak is a 75 y/o female with complicated PMH including Stage IV right breast cancer currently on Eribulin/Neulasta with Dr. [name] who presented 1/31 with sepsis like secondary to HCAP. Subsequently found to have RUL infiltrate and ESBL UTI, currently on zithromax and rocephin. Spontaneous seizures without any evidence of recurrence since PM 2/14. Appreciate Neurology assistance. MRI shows possible dural involvement of malignancy but no intracerebral metastases. Continuing Keppra per Neurology recommendations.  I have spent a total of 34 minutes of critical care time today caring for the patient and reviewing the patient's electronic medical record.  Sonia Baller Ashok Cordia, M.D. [facility] Pager: [phone] After 3pm or if no response, call [phone]  03/17/2015, 7:41 AM

## 2015-03-18 NOTE — Progress Notes (Signed)
Subjective: Patient had no new complaints. No recurrence of seizure activity reported. Patient was successfully extubated earlier today.  Objective: Current vital signs: BP 137/68 mmHg  Pulse 70  Temp(Src) 97.5 F (36.4 C) (Core (Comment))  Resp 22  Ht 5\' 3"  (1.6 m)  Wt 80.7 kg (177 lb 14.6 oz)  BMI 31.52 kg/m2  SpO2 100%  LMP 03/13/2012  Neurologic Exam: Patient was alert and in no acute distress. She was able to follow commands without difficulty. She was slightly disoriented to time. Extraocular movements were full and conjugate. Face was symmetrical. Speech was normal except for hoarseness. Patient moved extremities equally with symmetrical strength throughout.  Medications: I have reviewed the patient's current medications.  Assessment/Plan: 75 year old lady with stage IV metastatic breast cancer admitted for sepsis and pneumonia, who experienced cardiac arrest as well as new onset seizure activity on 03/16/2015. Etiology for new onset seizure activity is unclear, but possibly related to dural metastasis with cerebral cortical irritation. MRI was unremarkable for acute stroke as well as unremarkable for tumor metastasis to the brain.   Recommend no changes in current management, including continuing Keppra 1000 mg every 12 hours. No further neurodiagnostic studies are indicated. I will plan to see this patient on an as-needed basis following this visit.  C.R. Nicole Kindred, MD [facility] [phone]  03/18/2015  10:51 AM

## 2015-03-18 NOTE — Procedures (Signed)
Extubation Procedure Note  Patient Details:   Name: Brittney Cunningham DOB: April 10, 1940 MRN: [id number]   Airway Documentation:     Evaluation  O2 sats: stable throughout Complications: No apparent complications Patient did tolerate procedure well. Bilateral Breath Sounds: Clear, Diminished Suctioning: Oral, Airway Yes  Johnette Abraham 03/18/2015, 9:46 AM

## 2015-03-19 DIAGNOSIS — J9602 Acute respiratory failure with hypercapnia: Secondary | ICD-10-CM

## 2015-03-19 LAB — GLUCOSE, CAPILLARY
GLUCOSE-CAPILLARY: 95 mg/dL (ref 65–99)
GLUCOSE-CAPILLARY: 80 mg/dL (ref 65–99)
GLUCOSE-CAPILLARY: 78 mg/dL (ref 65–99)
GLUCOSE-CAPILLARY: 101 mg/dL — AB (ref 65–99)
Glucose-Capillary: 123 mg/dL — ABNORMAL HIGH (ref 65–99)
Glucose-Capillary: 125 mg/dL — ABNORMAL HIGH (ref 65–99)
Glucose-Capillary: 113 mg/dL — ABNORMAL HIGH (ref 65–99)

## 2015-03-19 LAB — CBC WITH DIFFERENTIAL/PLATELET
Basophils Absolute: 0 10*3/uL (ref 0.0–0.1)
Basophils Relative: 0 %
Eosinophils Absolute: 0 10*3/uL (ref 0.0–0.7)
Eosinophils Relative: 0 %
HCT: 26.9 % — ABNORMAL LOW (ref 36.0–46.0)
Hemoglobin: 8.1 g/dL — ABNORMAL LOW (ref 12.0–15.0)
Lymphocytes Relative: 12 %
Lymphs Abs: 1.1 10*3/uL (ref 0.7–4.0)
MCH: 29 pg (ref 26.0–34.0)
MCHC: 30.1 g/dL (ref 30.0–36.0)
MCV: 96.4 fL (ref 78.0–100.0)
Monocytes Absolute: 0.7 10*3/uL (ref 0.1–1.0)
Monocytes Relative: 8 %
Neutro Abs: 7.4 10*3/uL (ref 1.7–7.7)
Neutrophils Relative %: 80 %
Platelets: 171 10*3/uL (ref 150–400)
RBC: 2.79 MIL/uL — ABNORMAL LOW (ref 3.87–5.11)
RDW: 17.3 % — ABNORMAL HIGH (ref 11.5–15.5)
WBC: 9.3 10*3/uL (ref 4.0–10.5)

## 2015-03-19 LAB — RENAL FUNCTION PANEL
ANION GAP: 13 (ref 5–15)
Albumin: 2.5 g/dL — ABNORMAL LOW (ref 3.5–5.0)
BUN: 19 mg/dL (ref 6–20)
CHLORIDE: 106 mmol/L (ref 101–111)
CO2: 24 mmol/L (ref 22–32)
Calcium: 7.6 mg/dL — ABNORMAL LOW (ref 8.9–10.3)
Creatinine, Ser: 0.86 mg/dL (ref 0.44–1.00)
GFR calc Af Amer: >60 mL/min (ref >60)
GFR calc non Af Amer: >60 mL/min (ref >60)
GLUCOSE: 78 mg/dL (ref 65–99)
POTASSIUM: 3.7 mmol/L (ref 3.5–5.1)
Phosphorus: 3.9 mg/dL (ref 2.5–4.6)
Sodium: 143 mmol/L (ref 135–145)

## 2015-03-19 MED ORDER — INSULIN ASPART 100 UNIT/ML ~~LOC~~ SOLN: 0.0000 [IU] | Freq: Every day | SUBCUTANEOUS | Status: DC

## 2015-03-19 MED ORDER — INSULIN ASPART 100 UNIT/ML ~~LOC~~ SOLN
0.0000 [IU] | Freq: Three times a day (TID) | SUBCUTANEOUS | Status: DC
  Administered 2015-03-19 – 2015-03-22 (×7): 1 [IU] via SUBCUTANEOUS
  Administered 2015-03-23 – 2015-03-24 (×2): 2 [IU] via SUBCUTANEOUS
  Administered 2015-03-25: 1 [IU] via SUBCUTANEOUS

## 2015-03-19 MED ORDER — PANTOPRAZOLE SODIUM 40 MG PO TBEC
40.0000 mg | DELAYED_RELEASE_TABLET | Freq: Every day | ORAL | Status: DC
  Administered 2015-03-19 – 2015-03-25 (×7): 40 mg via ORAL
  Filled 2015-03-19 (×10): qty 1

## 2015-03-19 MED ORDER — HEPARIN SODIUM (PORCINE) 5000 UNIT/ML IJ SOLN
5000.0000 [IU] | Freq: Three times a day (TID) | INTRAMUSCULAR | Status: DC
  Administered 2015-03-19 – 2015-03-25 (×19): 5000 [IU] via SUBCUTANEOUS
  Filled 2015-03-19 (×20): qty 1

## 2015-03-19 NOTE — Progress Notes (Signed)
CSW assisting with d/c planning. Pt remains on vent. Pt has a SNF bed at [facility] when stable for d/c and no longer requiring vent support. CSW will continue to follow to assist with d/c planning.  Werner Lean LCSW [phone]

## 2015-03-19 NOTE — Progress Notes (Signed)
PT Cancellation Note  Patient Details Name: Brittney Cunningham MRN: [id number] DOB: 09/14/1940   Cancelled Treatment:    Reason Eval/Treat Not Completed: Other (comment); pt family declines PT at this time stating that they want pt to rest since she just had pain meds; will check back another time/as schedule permits   [facility] 03/19/2015, 2:30 PM

## 2015-03-19 NOTE — Progress Notes (Signed)
PULMONARY / CRITICAL CARE MEDICINE   Name: Brittney Cunningham MRN: [id number] DOB: 1940/08/18    ADMISSION DATE:  03/02/2015 CONSULTATION DATE:  03/10/15  REFERRING MD:  Dr. Doyle Askew / [facility]   CHIEF COMPLAINT:  AMS  SUBJECTIVE:  Patient denies any chest pain or pressure. No dyspnea or cough. Reports she would like to try to get out of bed today.  REVIEW OF SYSTEMS:  No fever or chills. No nausea or emesis. No abdominal pain.  VITAL SIGNS: BP 113/32 mmHg  Pulse 78  Temp(Src) 96.6 F (35.9 C) (Core (Comment))  Resp 16  Ht 5\' 3"  (1.6 m)  Wt 171 lb 1.2 oz (77.6 kg)  BMI 30.31 kg/m2  SpO2 100%  LMP 03/13/2012  HEMODYNAMICS:    VENTILATOR SETTINGS: Vent Mode:  [-] PSV;CPAP FiO2 (%):  [30 %] 30 % Vt Set:  [400 mL] 400 mL PEEP:  [5 cmH20] 5 cmH20 Delta P (Amplitude):  [7] 7 Pressure Support:  [5 cmH20] 5 cmH20  INTAKE / OUTPUT: I/O last 3 completed shifts: In: 1087.5 [I.V.:757.5; IV Piggyback:330] Out: 1540 [Urine:1540]  PHYSICAL EXAMINATION: General: Awake. Alert. No distress. Neuro: Oriented to person, place, & time as well as season. Following commands. Moving all 4 extremities. HEENT: MMM. No oral ulcers. No scleral injection. Cardiovascular: Regular rate. No JVD. Slightly irregular rhythm. Lungs: Normal work of breathing on [place] oxygen. Speaking in complete sentences. BS slightly decreased in bases. Abdomen: Soft. Nondistended. Nontender. Skin: No rashes. Warm & dry.  LABS:  BMET  Recent Labs Lab 03/17/15 0452 03/18/15 0322 03/19/15 0550  NA 144 142 143  K 3.9 3.8 3.7  CL 108 105 106  CO2 25 22 24   BUN 19 22* 19  CREATININE 1.12* 1.00 0.86  GLUCOSE 126* 85 78    Electrolytes  Recent Labs Lab 03/17/15 0026 03/17/15 0452 03/18/15 0322 03/19/15 0550  CALCIUM 7.2* 7.3* 7.5* 7.6*  MG 1.9 1.9 1.8  --   PHOS  --   --  4.2 3.9    CBC  Recent Labs Lab 03/17/15 0452 03/18/15 0322 03/19/15 0550  WBC 7.6 7.7 9.3  HGB 7.4* 7.5* 8.1*  HCT 24.3*  25.0* 26.9*  PLT 128* 130* 171    Coag's  Recent Labs Lab 03/16/15 0825  INR 1.35    Sepsis Markers  Recent Labs Lab 03/16/15 0825 03/16/15 1300  LATICACIDVEN 7.2* 2.4*    ABG  Recent Labs Lab 03/16/15 0829  PHART 7.338*  PCO2ART 46.7*  PO2ART 436*    Liver Enzymes  Recent Labs Lab 03/14/15 0500 03/15/15 0530 03/16/15 0825 03/18/15 0322 03/19/15 0550  AST 65* 47* 58*  --   --   ALT 9* 7* 8*  --   --   ALKPHOS 135* 141* 150*  --   --   BILITOT 0.7 0.9 0.9  --   --   ALBUMIN 2.1* 2.3* 2.3* 2.4* 2.5*    Cardiac Enzymes  Recent Labs Lab 03/16/15 0825  TROPONINI 0.12*    Glucose  Recent Labs Lab 03/18/15 0349 03/18/15 0803 03/18/15 1250 03/18/15 1644 03/18/15 1952 03/18/15 2310  GLUCAP 73 77 82 85 85 95    Imaging No results found.  STUDIES:  CT chest 2/4: Posterior RUL opacity compatible with PNA. Small bilateral pleural effusion. Multiple previously described lung nodules obscured by acute changes.  EEG 2/14:  Moderate generalized slowing. No epileptiform activity. CTA Chest 2/14:  Cardiomegaly w/ moderate bilateral pleural effusions. No PE. GGO. CT Head w/o 2/14:  Skull mets noted. No acute intracranial abnormality. Port CXR 2/15:  ETT in good position. Bilateral lower lung opacities consistent with effusion versus atelectasis. MRI 2/15: no acute infarct, no evidence of acute parenchymal brain metastases, diffuse pachymeningeal ennhancement  CULTURES: Urine 1/31: ESBL E. Coli 40,000 colonies  Blood 1/31: Negative   ANTIBIOTICS: Azithromycin 2/4 -2/8  Rocephin 2/4 - 2/8  Vancomycin 1/31 - 2/4  Zosyn 1/31 - 2/4  Primaxin 2/3 - 2/4   SIGNIFICANT EVENTS: 1/31 - Admitted for sepsis likely due to HCAP  2/08 - Rapid response initiated for AMS, lethargy, transferred to SDU, PCCM consulted  2/14 - PEA arrest w/ brief code 2/2 seizure - Second seizure in MRI tonic clonic  LINES/TUBES: Port >> PIV  OETT 7.5 2/14 - 2/16 OGT  2/14 - 2/16 Foley 2/14 - 2/17  ASSESSMENT / PLAN:  [name] is a 75 y/o female with complicated PMH including Stage IV right breast cancer currently on Eribulin/Neulasta with Dr. [name] who presented 1/31 with sepsis like secondary to HCAP. Subsequently found to have RUL infiltrate and ESBL UTI s/p treatment. Spontaneous seizures without any evidence of recurrence since PM 2/14. Appreciate Neurology assistance. MRI shows possible dural involvement of malignancy but no intracerebral metastases. Continuing Keppra per Neurology recommendations. Patient stable post extubation 24 hours and more alert with less delirium today.  1. New Onset Seizures:  Likely due to dural involvement of malignancy. Neurology consulted. Continuing Keppra indefinitely. No intracranial mets on MRI. Ativan IV prn seizure.  2. Stage IV Right Breast Cancer:  Skull mets. Dr. [name] following with oncology. MS IR prn pain. 3. Delirium:  Post extubation. Resolved. 4. Acute Hypercarbic & Hypoxic Respiratory Failure:  Secondary to seizure. Resolving. Extubated 24 hours. Continue pulmonary toilette with IS & Flutter valve. Wean FiO2 for Sat >92%. 5. PEA Arrest:  Likely precipitated by hypoxia & seizure. Continuing to monitor on telemetry. 6. Acute Combined CHF:  Cardiology following. Continuing Lasix, Lipitor, ASA & Coreg. 7. Bilateral Pleural Effusions:  Secondary to CHF. Trend with diuresis with intermittent CXR. 8. Anemia:  Mild. No signs of active bleeding. 9. Thrombocytopenia:  Resolved. 10. DM:  Switching Accu-Checks to qAC & HS. SSI per algorithm. 11. Hypothyroidism:  Continuing Synthroid PO. 12. Carotid Artery Stenosis:  Continuing Plavix. 13. MAT:  Continuing Coreg. Monitoring on telemetry. 14. Diet:  Clear liquid diet & advance as tolerated. 15. Prophylaxis:  SCDs, Protonix PO, & start heparin [place] q8hr. 16. Disposition:  PT consulted. Changing patient to stepdown status. Plan to transfer patient back to  hospitalist service & PCCM will sign off as of 2/18. Seizure & Fall precautions in place.  Sonia Baller Ashok Cordia, M.D. [facility] Pulmonary & Critical Care Pager: [phone] After 3pm or if no response, call [phone]  03/17/2015, 7:41 AM

## 2015-03-19 NOTE — Progress Notes (Signed)
I wasted 16ml of fentanyl in the sink witnessed by [facility] May, RN.

## 2015-03-20 DIAGNOSIS — R569 Unspecified convulsions: Secondary | ICD-10-CM | POA: Insufficient documentation

## 2015-03-20 LAB — CBC WITH DIFFERENTIAL/PLATELET
BASOS PCT: 0 %
Basophils Absolute: 0 10*3/uL (ref 0.0–0.1)
EOS ABS: 0 10*3/uL (ref 0.0–0.7)
Eosinophils Relative: 1 %
HEMATOCRIT: 24 % — AB (ref 36.0–46.0)
HEMOGLOBIN: 7.4 g/dL — AB (ref 12.0–15.0)
LYMPHS ABS: 1 10*3/uL (ref 0.7–4.0)
Lymphocytes Relative: 18 %
MCH: 29.6 pg (ref 26.0–34.0)
MCHC: 30.8 g/dL (ref 30.0–36.0)
MCV: 96 fL (ref 78.0–100.0)
MONOS PCT: 8 %
Monocytes Absolute: 0.5 10*3/uL (ref 0.1–1.0)
NEUTROS ABS: 4.2 10*3/uL (ref 1.7–7.7)
NEUTROS PCT: 73 %
PLATELETS: 135 10*3/uL — AB (ref 150–400)
RBC: 2.5 MIL/uL — AB (ref 3.87–5.11)
RDW: 17.3 % — ABNORMAL HIGH (ref 11.5–15.5)
WBC: 5.8 10*3/uL (ref 4.0–10.5)

## 2015-03-20 LAB — RENAL FUNCTION PANEL
ANION GAP: 9 (ref 5–15)
Albumin: 2.4 g/dL — ABNORMAL LOW (ref 3.5–5.0)
BUN: 19 mg/dL (ref 6–20)
CALCIUM: 7.4 mg/dL — AB (ref 8.9–10.3)
CHLORIDE: 106 mmol/L (ref 101–111)
CO2: 27 mmol/L (ref 22–32)
Creatinine, Ser: 0.82 mg/dL (ref 0.44–1.00)
Glucose, Bld: 113 mg/dL — ABNORMAL HIGH (ref 65–99)
Phosphorus: 3.3 mg/dL (ref 2.5–4.6)
Potassium: 3.6 mmol/L (ref 3.5–5.1)
Sodium: 142 mmol/L (ref 135–145)

## 2015-03-20 LAB — CBC
HCT: 24.2 % — ABNORMAL LOW (ref 36.0–46.0)
Hemoglobin: 7.5 g/dL — ABNORMAL LOW (ref 12.0–15.0)
MCH: 29.1 pg (ref 26.0–34.0)
MCHC: 31 g/dL (ref 30.0–36.0)
MCV: 93.8 fL (ref 78.0–100.0)
PLATELETS: 149 10*3/uL — AB (ref 150–400)
RBC: 2.58 MIL/uL — ABNORMAL LOW (ref 3.87–5.11)
RDW: 17.3 % — AB (ref 11.5–15.5)
WBC: 5.8 10*3/uL (ref 4.0–10.5)

## 2015-03-20 LAB — GLUCOSE, CAPILLARY
GLUCOSE-CAPILLARY: 116 mg/dL — AB (ref 65–99)
GLUCOSE-CAPILLARY: 125 mg/dL — AB (ref 65–99)
Glucose-Capillary: 134 mg/dL — ABNORMAL HIGH (ref 65–99)
Glucose-Capillary: 146 mg/dL — ABNORMAL HIGH (ref 65–99)

## 2015-03-20 NOTE — Progress Notes (Signed)
Brittney Cunningham   DOB:[birth year]   MW#:[id number]   UVO#:[id number]  Subjective:  Brittney Cunningham has been successfully re-extubated. She is still "a .ittle groggy" but denies pain. Not feeling like eating but drinking OK. Having regular BMs. No family in room  Objective: older White woman examined in [place]:   03/20/15 0000 03/20/15 0400  BP: 139/46 128/64  Pulse: 131 106  Temp:  97.8 F (36.6 C)  Resp: 24 29    Body mass index is 30.31 kg/(m^2).  Intake/Output Summary (Last 24 hours) at 03/20/15 0908 Last data filed at 03/20/15 0400  Gross per 24 hour  Intake    160 ml  Output    665 ml  Net   -505 ml    Sclerae not icteric  No cervical or [place] adenopathy  Lungs no rales or wheezes   Heart RRR  Abdomen soft, NT, +BS  Neuro nonfocal  Breast exam: deferred  CBG (last 3)   Recent Labs  03/19/15 1223 03/19/15 1608 03/19/15 2157  GLUCAP 101* 125* 113*     Labs:  Lab Results  Component Value Date   WBC 5.8 03/20/2015   HGB 7.4* 03/20/2015   HCT 24.0* 03/20/2015   MCV 96.0 03/20/2015   PLT 135* 03/20/2015   NEUTROABS 4.2 03/20/2015    '@LASTCHEMISTRY'$ @  Urine Studies No results for input(s): UHGB, CRYS in the last 72 hours.  Invalid input(s): UACOL, UAPR, USPG, UPH, UTP, UGL, UKET, UBIL, UNIT, UROB, ULEU, UEPI, UWBC, URBC, UBAC, CAST, [place], [place]  Basic Metabolic Panel:  Recent Labs Lab 03/14/15 0500  03/16/15 0535  03/17/15 0026 03/17/15 0452 03/18/15 0322 03/19/15 0550 03/20/15 0400  NA 142  < > 144  < > 142 144 142 143 142  K 3.3*  < > 4.0  < > 3.9 3.9 3.8 3.7 3.6  CL 107  < > 103  < > 106 108 105 106 106  CO2 27  < > 25  < > '24 25 22 24 27  '$ GLUCOSE 116*  < > 147*  < > 125* 126* 85 78 113*  BUN 22*  < > 19  < > 19 19 22* 19 19  CREATININE 0.82  < > 0.97  < > 1.05* 1.12* 1.00 0.86 0.82  CALCIUM 7.5*  < > 7.9*  < > 7.2* 7.3* 7.5* 7.6* 7.4*  MG 1.2*  --  1.5*  --  1.9 1.9 1.8  --   --   PHOS  --   --   --   --   --   --  4.2 3.9 3.3  < > =  values in this interval not displayed. GFR Estimated Creatinine Clearance: 61.2 mL/min (by C-G formula based on Cr of 0.82). Liver Function Tests:  Recent Labs Lab 03/14/15 0500 03/15/15 0530 03/16/15 0825 03/18/15 0322 03/19/15 0550 03/20/15 0400  AST 65* 47* 58*  --   --   --   ALT 9* 7* 8*  --   --   --   ALKPHOS 135* 141* 150*  --   --   --   BILITOT 0.7 0.9 0.9  --   --   --   PROT 4.6* 4.9* 5.3*  --   --   --   ALBUMIN 2.1* 2.3* 2.3* 2.4* 2.5* 2.4*   No results for input(s): LIPASE, AMYLASE in the last 168 hours. No results for input(s): AMMONIA in the last 168 hours. Coagulation profile  Recent Labs Lab 03/16/15  0825  INR 1.35    CBC:  Recent Labs Lab 03/16/15 0825 03/17/15 0452 03/18/15 0322 03/19/15 0550 03/20/15 0400  WBC 16.7* 7.6 7.7 9.3 5.8  NEUTROABS 9.7*  --  6.1 7.4 4.2  HGB 8.6* 7.4* 7.5* 8.1* 7.4*  HCT 28.6* 24.3* 25.0* 26.9* 24.0*  MCV 95.7 95.3 95.4 96.4 96.0  PLT 151 128* 130* 171 135*   Cardiac Enzymes:  Recent Labs Lab 03/16/15 0825  TROPONINI 0.12*   BNP: Invalid input(s): POCBNP CBG:  Recent Labs Lab 03/19/15 0544 03/19/15 0816 03/19/15 1223 03/19/15 1608 03/19/15 2157  GLUCAP 80 78 101* 125* 113*   D-Dimer No results for input(s): DDIMER in the last 72 hours. Hgb A1c No results for input(s): HGBA1C in the last 72 hours. Lipid Profile No results for input(s): CHOL, HDL, LDLCALC, TRIG, CHOLHDL, LDLDIRECT in the last 72 hours. Thyroid function studies No results for input(s): TSH, T4TOTAL, T3FREE, THYROIDAB in the last 72 hours.  Invalid input(s): FREET3 Anemia work up No results for input(s): VITAMINB12, FOLATE, FERRITIN, TIBC, IRON, RETICCTPCT in the last 72 hours. Microbiology Recent Results (from the past 240 hour(s))  Culture, blood (Routine X 2) w Reflex to ID Panel     Status: None (Preliminary result)   Collection Time: 03/16/15  8:25 AM  Result Value Ref Range Status   Specimen Description BLOOD RIGHT  WRIST  Final   Special Requests BOTTLES DRAWN AEROBIC AND ANAEROBIC 5CC  Final   Culture   Final    NO GROWTH 3 DAYS Performed at [facility]    Report Status PENDING  Incomplete  Culture, blood (routine x 2)     Status: None (Preliminary result)   Collection Time: 03/16/15 10:34 AM  Result Value Ref Range Status   Specimen Description BLOOD LEFT HAND  Final   Special Requests IN PEDIATRIC BOTTLE 3CC  Final   Culture   Final    NO GROWTH 3 DAYS Performed at [facility]    Report Status PENDING  Incomplete  Culture, blood (Routine X 2) w Reflex to ID Panel     Status: None (Preliminary result)   Collection Time: 03/16/15 10:35 AM  Result Value Ref Range Status   Specimen Description BLOOD LEFT ARM  Final   Special Requests IN PEDIATRIC BOTTLE 3CC  Final   Culture   Final    NO GROWTH 3 DAYS Performed at [facility]    Report Status PENDING  Incomplete  Urine culture     Status: None   Collection Time: 03/16/15  1:14 PM  Result Value Ref Range Status   Specimen Description URINE, CATHETERIZED  Final   Special Requests NONE  Final   Culture   Final    NO GROWTH 1 DAY Performed at [facility]    Report Status 03/17/2015 FINAL  Final      Studies:  Dg Chest 2 View  03/02/2015  CLINICAL DATA:  Shortness of breath, cough and congestion since Sunday, history coronary artery disease, chronic renal insufficiency, coronary artery disease, hypertension, type II diabetes mellitus, metastatic breast cancer EXAM: CHEST  2 VIEW COMPARISON:  09/21/2014 FINDINGS: RIGHT subclavian Port-A-Cath with tip projecting over SVC. Normal heart size, mediastinal contours, and pulmonary vascularity. Atherosclerotic calcification aorta. Eventration of the anterior RIGHT diaphragm. Increased markings at anterior RIGHT lung base question chronic atelectasis. Remaining lungs grossly clear. No pleural effusion or pneumothorax. Diffuse osteosclerosis again identified, patchy,  likely related to known osseous metastatic disease. IMPRESSION:  Chronic RIGHT basilar atelectasis. No definite acute pulmonary abnormalities. Diffuse osseous metastatic disease. Electronically Signed   By: Lavonia Dana M.D.   On: 03/02/2015 12:33   Ct Head Wo Contrast  03/16/2015  CLINICAL DATA:  Seizure, stage IV breast cancer. EXAM: CT HEAD WITHOUT CONTRAST TECHNIQUE: Contiguous axial images were obtained from the base of the skull through the vertex without intravenous contrast. COMPARISON:  CT scan of July 01, 2009. FINDINGS: Bilateral maxillary sinusitis is noted. Lucencies are noted throughout the skull which may represent metastatic disease. Minimal diffuse cortical atrophy is noted. Minimal chronic ischemic white matter disease is noted. No mass effect or midline shift is noted. Ventricular size is within normal limits. There is no evidence of mass lesion, hemorrhage or acute infarction. IMPRESSION: Lucencies are noted throughout the skull concerning for metastatic disease as noted on prior exam. Minimal diffuse cortical atrophy and chronic ischemic white matter disease. No acute intracranial abnormality seen. Electronically Signed   By: Marijo Conception, M.D.   On: 03/16/2015 09:22   Ct Chest Wo Contrast  03/06/2015  CLINICAL DATA:  Dyspnea. Dizziness, fatigue, and upper respiratory symptoms. Tachycardia, tachypnea, and fever. Metastatic right breast cancer currently undergoing chemotherapy. EXAM: CT CHEST WITHOUT CONTRAST TECHNIQUE: Multidetector CT imaging of the chest was performed following the standard protocol without IV contrast. COMPARISON:  01/12/2015 FINDINGS: There is new diffuse body wall edema and on organized subcutaneous fluid, greatest in the right lateral chest wall and incompletely visualized. Sequelae of right mastectomy are again identified. A right subclavian Port-A-Cath remains in place and terminates at the cavoatrial junction. Diffuse aortic and coronary artery calcifications are  noted. The heart is normal in size. No enlarged axillary, mediastinal, or hilar lymph nodes are identified. Small left axillary lymph nodes measure up to 9 mm in short axis. Small mediastinal lymph nodes measure up to 8 mm in short axis. There are new, small bilateral pleural effusions with overlying dependent atelectasis and/or consolidation in the lower lobes. Emphysematous changes are greatest in the right upper lobe. There is new ground-glass and patchy consolidative opacity in the posterior right upper lobe. Small nodules along the right major fissure on the prior study are not well seen on this examination due to adjacent lung opacity and motion artifact. Subpleural reticulation is noted in the anterior right upper and right middle lobes related to prior radiation therapy. 5 mm left lower lobe nodule is unchanged (series 6, image 37). Small nodules along the left major fissure unchanged. Other small nodules seen on the prior study as well as plaque-like pleural thickening in the right lower lobe are obscured on the current examination by parenchymal consolidation/atelectasis and motion artifact. Small focus of residual opacity in the lingula on the prior study has resolved. The numerous liver lesions described on the prior study are not well seen on this noncontrast examination. Calcified atherosclerosis is noted in the upper abdomen. Sclerotic osseous metastases are again seen throughout the visualized skeleton with multiple nonacute rib fractures again noted. IMPRESSION: 1. Posterior right upper lobe opacity compatible with pneumonia. 2. Small bilateral pleural effusions with bilateral lower lobe atelectasis/consolidation. 3. Multiple previously described lung nodules are obscured on the current examination due to the above acute changes. The 5 mm left lower lobe nodule is unchanged. 4. Similar appearance of diffuse osseous metastases. Electronically Signed   By: Logan Bores M.D.   On: 03/06/2015 16:41    Ct Angio Chest Pe W/cm &/or Wo Cm  03/16/2015  CLINICAL DATA:  Post arrest, post intubation. Stage IV right breast cancer, unresponsive. EXAM: CT ANGIOGRAPHY CHEST WITH CONTRAST TECHNIQUE: Multidetector CT imaging of the chest was performed using the standard protocol during bolus administration of intravenous contrast. Multiplanar CT image reconstructions and MIPs were obtained to evaluate the vascular anatomy. CONTRAST:  OMNIPAQUE IOHEXOL 350 MG/ML SOLN COMPARISON:  03/06/2015 FINDINGS: There is cardiomegaly. Aorta is normal caliber. Densely calcified coronary arteries diffusely. No filling defects in the pulmonary arteries to suggest pulmonary emboli. There are moderate bilateral pleural effusions, increasing since prior study. Probable compressive atelectasis in the lower lobes bilaterally. Mild ground-glass opacities throughout the lungs could reflect mild edema. No mediastinal, hilar, or axillary adenopathy. Previously seen nodularity in the lungs not well visualized due to airspace disease and ground-glass opacities. Diffuse sclerotic metastases throughout the visualized bony thorax. Right Port-A-Cath is in place with the tip in the SVC. Previously described numerous liver lesions cannot be visualized on this chest CTA. Review of the MIP images confirms the above findings. IMPRESSION: No evidence of pulmonary embolus. Moderate bilateral pleural effusions, enlarging since prior study. Probable compressive atelectasis versus consolidation in the lower lobes. Cardiomegaly.  Mild ground-glass opacities may reflect early edema. Diffuse coronary artery disease. Diffuse sclerotic bony metastases. Electronically Signed   By: Charlett Nose M.D.   On: 03/16/2015 09:37   Mr Brain Wo Contrast  03/16/2015  CLINICAL DATA:  Metastatic breast cancer with possible seizure. EXAM: MRI HEAD WITHOUT CONTRAST TECHNIQUE: Multiplanar, multiecho pulse sequences of the brain and surrounding structures were obtained  without intravenous contrast. COMPARISON:  None. FINDINGS: Partial study (only sagittal T1 and diffusion imaging obtained) due to seizure during image acquisition. The available images are motion degraded. There is no evidence of infarct, hydrocephalus, or shift. Diffuse marrow hypo intensity correlating with history of widespread osseous metastases. Chronic sinusitis with diffuse mucosal thickening in the maxillary sinuses. IMPRESSION: 1. Incomplete study due to seizure. 2. Negative for acute infarct. Electronically Signed   By: Marnee Spring M.D.   On: 03/16/2015 15:54   Mr Laqueta Jean KG Contrast  03/17/2015  ADDENDUM REPORT: 03/17/2015 11:44 ADDENDUM: 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 Electronically Signed   By: Sebastian Ache M.D.   On: 03/17/2015 11:44  03/17/2015  CLINICAL DATA:  Possible seizure. History of metastatic breast cancer. EXAM: MRI HEAD WITHOUT AND WITH CONTRAST TECHNIQUE: Multiplanar, multiecho pulse sequences of the brain and surrounding structures were obtained without and with intravenous contrast. CONTRAST:  63mL MULTIHANCE GADOBENATE DIMEGLUMINE 529 MG/ML IV SOLN COMPARISON:  Incomplete brain MRI 03/16/2015.  Head CT 03/16/2015. FINDINGS: Dedicated thin section imaging through the temporal lobes demonstrates symmetric volume of the hippocampi without signal abnormality identified. There is no evidence of acute infarct, intracranial hemorrhage, mass, midline shift, or extra-axial fluid collection. There is mild generalized cerebral atrophy. T2 hyperintensities in the subcortical and periventricular white matter are compatible with mild chronic small vessel ischemic disease. No abnormal brain parenchymal enhancement or leptomeningeal enhancement is identified. There is mild diffuse pachymeningeal enhancement bilaterally. This is largely thin and smooth in nature, however there is mild nodularity most notable in the right frontal region measuring 4-5 mm in thickness. There is moderate  circumferential mucosal thickening in the maxillary sinuses with mild thickening in a posterior left ethmoid air cell. A trace left mastoid effusion is noted. Major intracranial vascular flow voids are preserved. Diffusely abnormal bone marrow signal is again noted and consistent with widespread osseous metastases. IMPRESSION: 1. No acute infarct. 2. No evidence of parenchymal  brain metastases. 3. Diffuse pachymeningeal enhancement. This may represent a combination of reactive dural thickening related to widespread skull metastases as well as dural involvement by tumor, particularly in the right frontal region. 4. Mild chronic small vessel ischemic disease and cerebral atrophy. Electronically Signed: By: Logan Bores M.D. On: 03/17/2015 11:39   Dg Chest Port 1 View  03/17/2015  CLINICAL DATA:  Respiratory failure EXAM: PORTABLE CHEST 1 VIEW COMPARISON:  Portable chest x-ray of March 16, 2015 FINDINGS: The lungs are well-expanded. The hemidiaphragms remain obscured. The interstitial markings remain increased. The cardiac silhouette remains enlarged. The pulmonary vascularity is not engorged. The endotracheal tube tip lies approximately 3.7 cm above the carina. The esophagogastric tube tip projects below the inferior margin of the image. The Port-A-Cath appliance tip projects over the midportion of the SVC. IMPRESSION: Allowing for differences in positioning there has not been significant interval change since the previous study. Persistent bibasilar atelectasis or pneumonia with small bilateral pleural effusions. Stable cardiomegaly without significant pulmonary vascular congestion. Electronically Signed   By: David  Martinique M.D.   On: 03/17/2015 07:22   Dg Chest Port 1 View  03/16/2015  CLINICAL DATA:  Could in this morning.  Evaluate ET tube placement EXAM: PORTABLE CHEST 1 VIEW COMPARISON:  03/10/2015 FINDINGS: Endotracheal tube is 3.6 cm above the carina. Right Port-A-Cath tip at the cavoatrial junction.  Heart is upper limits normal in size. Bilateral lower lobe airspace opacities and probable layering effusions again noted, similar prior study. IMPRESSION: Bilateral lower lobe airspace opacities and small layering effusions, similar to prior study. Endotracheal tube 3.6 cm above the carina. Electronically Signed   By: Rolm Baptise M.D.   On: 03/16/2015 10:18   Dg Chest Port 1 View  03/10/2015  CLINICAL DATA:  Pneumonia EXAM: PORTABLE CHEST 1 VIEW COMPARISON:  03/06/2015 FINDINGS: Cardiac shadow is stable in appearance. A right chest wall port is seen. Scattered patchy infiltrates are noted throughout both lungs similar to that seen on recent CT examination. Small bilateral pleural effusions right greater than left are again noted. Changes consistent with metastatic disease in the bone are again noted. IMPRESSION: No change in patchy bilateral pneumonia. Bilateral pleural effusions are seen. Stable bony metastatic disease. Electronically Signed   By: Inez Catalina M.D.   On: 03/10/2015 12:48   Dg Abd Portable 1v  03/17/2015  CLINICAL DATA:  Orogastric tube placement. EXAM: PORTABLE ABDOMEN - 1 VIEW COMPARISON:  CT abdomen and pelvis 04/13/2012.  Abdomen 10/26/2011. FINDINGS: Enteric tube tip is projected over the lower mid abdomen consistent with location in the distal stomach. Scattered gas in the colon without small or large bowel distention. Diffuse heterogeneous bone sclerosis and lucency consistent with diffuse bone metastasis. Residual contrast material noted in the bladder. IMPRESSION: Enteric tube tip projects over the lower mid abdomen, likely in the distal stomach. Diffuse skeletal metastases. Electronically Signed   By: Lucienne Capers M.D.   On: 03/17/2015 01:01    Assessment: 75 y.o. [place] woman:   (1) Status post right breast biopsy in 05/2009 for a grade 2 invasive ductal carcinoma, T2 NX M1, Stage IV, strongly estrogen and progesterone receptor-positive, HER-2-negative with an MIB-1 of  26%.  (2) Neoadjuvantly she received Letrozole and zoledronic acid beginning in June of 2011 and underwent right modified radical mastectomy February of 2012 for a ypT2 ypN2, grade 1 invasive ductal carcinoma with negative margins.   (3) She completed radiation therapy in August of 2012.   (4) She has continued  on Letrozole but was switched from Zoledronic acid to Denosumab Delton See) because of concerns regarding her serum creatinine. Delton See was being given every 8 weeks.  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  (6) symptomatic anemia, with creatinine clearance < 60 cc/min; Darbepoietin Q14d started 11/03/2011; received Feraheme 01/05/2012; ferritin August 2016 was greater than 800  (7) On subcutaneous B-12 supplementation monthly.  (8) Pain from osseous metastasis, osteoarthritis, tendinopathy, and bursitis as confirmed by recent MRI.  (9) Anemia, multifactorial with renal disease, on Aranesp monthly, increased to every 2 weeks June 2016  (10) letrozole was discontinued in September 2014 with evidence of disease progression. She was started on fulvestrant injections, first given on 10/17/2012; stopped 06/03/2014 with progression  (11) exemestane and everolimus started 07/08/2014 (a) everolimus discontinued August 2016 with pneumonitis (b) exemestane held at the start of everolimus January 2017  (12) liver biopsy 02/05/2015 confirms metastatic breast cancer to the liver, estrogen and progesterone receptor positive, HER-2 negative  (13) eribulin started 02/17/2015, to be given days 1 and 8 of each 21 day cycle, with neulasta day 9  (a) cycle 2 (originally scheduled for 03/10/2015) held until patient back to baseline and outpatient  (14) seizure 03/16/2015, with no evidence of CVA or CNS metastases, but 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 From a Breast Cancer point of view we have many options which are well tolerated. More importantly, the question for Brittney Cunningham and her family will be whether she wants to go through life support/ resuscitation in case of a future critical event. I will address this with her and family at a propitious point sometime prior to discharge.  Greatly appreciate your help to this patient and her family!    Chauncey Cruel, MD 03/20/2015  9:08 AM Medical Oncology and Hematology [facility] [address] [place], [place] [address] Tel. [phone]    Fax. [phone]

## 2015-03-20 NOTE — Progress Notes (Signed)
PT Cancellation Note  Patient Details Name: Brittney Cunningham MRN: [id number] DOB: Jul 12, 1940   Cancelled Treatment:    Reason Eval/Treat Not Completed: Patient not medically ready;Medical issues which prohibited therapy; PT signing off for a second time as pt is back on the vent; PT attempted yesterday (pt was extubated), but the family refused for her as it was not a good time; recommend pt OOB as tolerated with nursing if medical status should allow, after that time, PT eval may be possible but please re-order; Thank you.  [facility] 03/20/2015, 8:03 AM

## 2015-03-20 NOTE — Progress Notes (Signed)
[facility] Progress Note  Patient: Brittney Cunningham [id number]   PCP: [name], MD DOB: [birth year]   DOA: 03/02/2015   DOS: 03/20/2015   Date of Service: the patient was seen and examined on 03/20/2015  Subjective: Patient is a significant weakness although no shortness of breath no chest pain. Some sore throat. Some nausea. But no vomiting. Some abdominal discomfort. No diarrhea or constipation. No burning urination. Nutrition: Tolerating oral diet Activity: Out of bed to chair with assistance Last BM: 03/19/2015  Assessment and Plan: 1. Sepsis ([place]) Initial admission for sepsis due to healthcare associated pneumonia. As well as ESBL UTI. Patient treated with fosfomycin for ESBL and has been treated with broad-spectrum antibiotics and completed the course of treatment. Currently no fever no leukocytosis.  2. PEA arrest  Patient had new onset seizure which was followed by PEA arrest. Development 03/16/2015. Patient was intubated and was in the ICU care. Extubated on 03/18/2015. Likely secondary to hypoxia as well as seizure. Currently stable. Her CODE STATUS in the interim has been changed from DO NOT RESUSCITATE to partial code as per discussion with the family.  3. New onset seizure. 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 Management per oncology. Seizures are more likely secondary to dural involvement of the malignancy. Currently continuing Keppra. Ativan when necessary with seizure prophylaxis.  4. Acute combined CHF. Elevated troponin. Aspect of ischemic cardiomyopathy with severe MR Multifocal atrial tachycardia Cardiology was consulted. Ejection fraction 30-35% with akinesis of basal mid inferior myocardium. Patient was recommended to be treated conservatively given her metastatic cancer. Currently on aspirin and Plavix. Also on Lipitor as well as carvedilol. No ongoing chest pain.  5. Hypothyroidism. Continuing Synthroid.  6. Chronic pain syndrome. On  morphine as needed.  7. Diabetes mellitus type 2. On sliding scale insulin as well as Levemir. Will continue to monitor.  8. Anemia of chronic disease. H&H drifting down this morning but has stabilized. Will need to closely monitor. No active bleeding.  DVT Prophylaxis: subcutaneous Heparin Nutrition: Cardiac diet carb modified Advance goals of care discussion: Partial  Consultants: Cardiology, pulmonary, neurology Antibiotics: Anti-infectives    Start     Dose/Rate Route Frequency Ordered Stop   03/17/15 1600  levofloxacin (LEVAQUIN) IVPB 750 mg  Status:  Discontinued     750 mg 100 mL/hr over 90 Minutes Intravenous Every 24 hours 03/16/15 1037 03/16/15 1038   03/17/15 1600  levofloxacin (LEVAQUIN) IVPB 750 mg  Status:  Discontinued     750 mg 100 mL/hr over 90 Minutes Intravenous Every 48 hours 03/16/15 1038 03/16/15 1040   03/15/15 1400  levofloxacin (LEVAQUIN) IVPB 750 mg  Status:  Discontinued     750 mg 100 mL/hr over 90 Minutes Intravenous Every 24 hours 03/15/15 1329 03/16/15 1037   03/13/15 0200  vancomycin (VANCOCIN) 500 mg in sodium chloride 0.9 % 100 mL IVPB  Status:  Discontinued     500 mg 100 mL/hr over 60 Minutes Intravenous Every 12 hours 03/13/15 0045 03/15/15 1221   03/10/15 1400  imipenem-cilastatin (PRIMAXIN) 500 mg in sodium chloride 0.9 % 100 mL IVPB  Status:  Discontinued     500 mg 200 mL/hr over 30 Minutes Intravenous 3 times per day 03/10/15 1152 03/15/15 1221   03/10/15 1200  vancomycin (VANCOCIN) IVPB 750 mg/150 ml premix  Status:  Discontinued     750 mg 150 mL/hr over 60 Minutes Intravenous Every 12 hours 03/10/15 1152 03/13/15 0045   03/09/15 2200  azithromycin (ZITHROMAX) tablet 500  mg  Status:  Discontinued     500 mg Oral Daily at bedtime 03/09/15 0802 03/10/15 1053   03/06/15 2100  azithromycin (ZITHROMAX) 500 mg in dextrose 5 % 250 mL IVPB  Status:  Discontinued     500 mg 250 mL/hr over 60 Minutes Intravenous Every 24 hours 03/06/15 1921  03/09/15 0802   03/06/15 2000  cefTRIAXone (ROCEPHIN) 1 g in dextrose 5 % 50 mL IVPB  Status:  Discontinued     1 g 100 mL/hr over 30 Minutes Intravenous Every 24 hours 03/06/15 1921 03/10/15 1053   03/05/15 1800  imipenem-cilastatin (PRIMAXIN) 250 mg in sodium chloride 0.9 % 100 mL IVPB  Status:  Discontinued     250 mg 200 mL/hr over 30 Minutes Intravenous 4 times per day 03/05/15 1724 03/06/15 1444   03/03/15 1200  vancomycin (VANCOCIN) IVPB 1000 mg/200 mL premix  Status:  Discontinued     1,000 mg 200 mL/hr over 60 Minutes Intravenous Every 24 hours 03/02/15 1215 03/05/15 1643   03/02/15 1800  piperacillin-tazobactam (ZOSYN) IVPB 3.375 g  Status:  Discontinued     3.375 g 12.5 mL/hr over 240 Minutes Intravenous Every 8 hours 03/02/15 1215 03/05/15 1643   03/02/15 1145  piperacillin-tazobactam (ZOSYN) IVPB 3.375 g     3.375 g 100 mL/hr over 30 Minutes Intravenous  Once 03/02/15 1132 03/02/15 1225   03/02/15 1145  vancomycin (VANCOCIN) IVPB 1000 mg/200 mL premix     1,000 mg 200 mL/hr over 60 Minutes Intravenous  Once 03/02/15 1132 03/02/15 1355       Family Communication: family was present at bedside, at the time of interview.  Opportunity was given to ask question and all questions were answered satisfactorily.   Disposition:  Barriers to safe discharge: Improvement in respiratory status   Intake/Output Summary (Last 24 hours) at 03/20/15 1836 Last data filed at 03/20/15 1500  Gross per 24 hour  Intake    310 ml  Output    800 ml  Net   -490 ml   Filed Weights   03/18/15 0500 03/19/15 0500 03/20/15 1500  Weight: 80.7 kg (177 lb 14.6 oz) 77.6 kg (171 lb 1.2 oz) 76.9 kg (169 lb 8.5 oz)    Objective: Physical Exam: Filed Vitals:   03/20/15 1200 03/20/15 1222 03/20/15 1500 03/20/15 1600  BP:  130/48    Pulse:  72    Temp: 97.6 F (36.4 C)   97.3 F (36.3 C)  TempSrc:    Oral  Resp:  21    Height:      Weight:   76.9 kg (169 lb 8.5 oz)   SpO2:  100%        General: Appear in mild distress, no Rash; Oral Mucosa moist. Cardiovascular: S1 and S2 Present, aortic systolic Murmur, no JVD Respiratory: Bilateral Air entry present and faint basal Crackles, no wheezes Abdomen: Bowel Sound present, Soft and no tenderness Extremities: no Pedal edema, no calf tenderness Neurology: Grossly no focal neuro deficit.  Data Reviewed: CBC:  Recent Labs Lab 03/16/15 0825 03/17/15 0452 03/18/15 0322 03/19/15 0550 03/20/15 0400 03/20/15 1335  WBC 16.7* 7.6 7.7 9.3 5.8 5.8  NEUTROABS 9.7*  --  6.1 7.4 4.2  --   HGB 8.6* 7.4* 7.5* 8.1* 7.4* 7.5*  HCT 28.6* 24.3* 25.0* 26.9* 24.0* 24.2*  MCV 95.7 95.3 95.4 96.4 96.0 93.8  PLT 151 128* 130* 171 135* [id number]*   Basic Metabolic Panel:  Recent Labs Lab 03/14/15 0500  03/16/15  [id number]  03/17/15 0026 03/17/15 0452 03/18/15 0322 03/19/15 0550 03/20/15 0400  NA 142  < > 144  < > 142 144 142 143 142  K 3.3*  < > 4.0  < > 3.9 3.9 3.8 3.7 3.6  CL 107  < > 103  < > 106 108 105 106 106  CO2 27  < > 25  < > 24 25 22 24 27   GLUCOSE 116*  < > 147*  < > 125* 126* 85 78 113*  BUN 22*  < > 19  < > 19 19 22* 19 19  CREATININE 0.82  < > 0.97  < > 1.05* 1.12* 1.00 0.86 0.82  CALCIUM 7.5*  < > 7.9*  < > 7.2* 7.3* 7.5* 7.6* 7.4*  MG 1.2*  --  1.5*  --  1.9 1.9 1.8  --   --   PHOS  --   --   --   --   --   --  4.2 3.9 3.3  < > = values in this interval not displayed. Liver Function Tests:  Recent Labs Lab 03/14/15 0500 03/15/15 0530 03/16/15 0825 03/18/15 0322 03/19/15 0550 03/20/15 0400  AST 65* 47* 58*  --   --   --   ALT 9* 7* 8*  --   --   --   ALKPHOS 135* 141* 150*  --   --   --   BILITOT 0.7 0.9 0.9  --   --   --   PROT 4.6* 4.9* 5.3*  --   --   --   ALBUMIN 2.1* 2.3* 2.3* 2.4* 2.5* 2.4*   No results for input(s): LIPASE, AMYLASE in the last 168 hours. No results for input(s): AMMONIA in the last 168 hours.  Cardiac Enzymes:  Recent Labs Lab 03/16/15 0825  TROPONINI 0.12*    BNP (last 3  results)  Recent Labs  08/07/14 1158 08/10/14 1715 08/24/14 1042  BNP 343.0* 354.4* 478.7*    CBG:  Recent Labs Lab 03/19/15 0816 03/19/15 1223 03/19/15 1608 03/19/15 2157 03/20/15 0759  GLUCAP 78 101* 125* 113* 116*    Recent Results (from the past 240 hour(s))  Culture, blood (Routine X 2) w Reflex to ID Panel     Status: None (Preliminary result)   Collection Time: 03/16/15  8:25 AM  Result Value Ref Range Status   Specimen Description BLOOD RIGHT WRIST  Final   Special Requests BOTTLES DRAWN AEROBIC AND ANAEROBIC 5CC  Final   Culture   Final    NO GROWTH 4 DAYS Performed at [facility]    Report Status PENDING  Incomplete  Culture, blood (routine x 2)     Status: None (Preliminary result)   Collection Time: 03/16/15 10:34 AM  Result Value Ref Range Status   Specimen Description BLOOD LEFT HAND  Final   Special Requests IN PEDIATRIC BOTTLE 3CC  Final   Culture   Final    NO GROWTH 4 DAYS Performed at [facility]    Report Status PENDING  Incomplete  Culture, blood (Routine X 2) w Reflex to ID Panel     Status: None (Preliminary result)   Collection Time: 03/16/15 10:35 AM  Result Value Ref Range Status   Specimen Description BLOOD LEFT ARM  Final   Special Requests IN PEDIATRIC BOTTLE 3CC  Final   Culture   Final    NO GROWTH 4 DAYS Performed at [facility]    Report Status  PENDING  Incomplete  Urine culture     Status: None   Collection Time: 03/16/15  1:14 PM  Result Value Ref Range Status   Specimen Description URINE, CATHETERIZED  Final   Special Requests NONE  Final   Culture   Final    NO GROWTH 1 DAY Performed at [facility]    Report Status 03/17/2015 FINAL  Final     Studies: No results found.   Scheduled Meds: . aspirin  81 mg Oral Daily  . atorvastatin  80 mg Oral Daily  . calcium carbonate  1 tablet Oral TID WC  . carvedilol  15.625 mg Oral BID WC  . clopidogrel  75 mg Oral Daily  . furosemide  40  mg Oral Daily  . heparin subcutaneous  5,000 Units Subcutaneous 3 times per day  . insulin aspart  0-5 Units Subcutaneous QHS  . insulin aspart  0-9 Units Subcutaneous TID WC  . levETIRAcetam  1,000 mg Intravenous Q12H  . levothyroxine  150 mcg Oral QAC breakfast  . mirtazapine  7.5 mg Oral QHS  . pantoprazole  40 mg Oral Daily  . sodium chloride flush  10-40 mL Intracatheter Q12H   Continuous Infusions: . sodium chloride 10 mL/hr at 03/20/15 1500   PRN Meds: acetaminophen **OR** acetaminophen, glycerin adult, ipratropium-albuterol, LORazepam, morphine, ondansetron **OR** ondansetron (ZOFRAN) IV, phenol, sodium chloride flush  Time spent: 30 minutes  Author: Berle Mull, MD [facility] Pager: [phone] 03/20/2015 6:36 PM  If 7PM-7AM, please contact night-coverage at [email or web address], password [facility]

## 2015-03-21 DIAGNOSIS — D6481 Anemia due to antineoplastic chemotherapy: Secondary | ICD-10-CM

## 2015-03-21 LAB — RENAL FUNCTION PANEL
Albumin: 2.4 g/dL — ABNORMAL LOW (ref 3.5–5.0)
Anion gap: 10 (ref 5–15)
BUN: 17 mg/dL (ref 6–20)
CHLORIDE: 105 mmol/L (ref 101–111)
CO2: 29 mmol/L (ref 22–32)
Calcium: 7.5 mg/dL — ABNORMAL LOW (ref 8.9–10.3)
Creatinine, Ser: 0.8 mg/dL (ref 0.44–1.00)
GFR calc Af Amer: >60 mL/min (ref >60)
Glucose, Bld: 95 mg/dL (ref 65–99)
POTASSIUM: 3.4 mmol/L — AB (ref 3.5–5.1)
Phosphorus: 3 mg/dL (ref 2.5–4.6)
Sodium: 144 mmol/L (ref 135–145)

## 2015-03-21 LAB — CBC WITH DIFFERENTIAL/PLATELET
Basophils Absolute: 0 10*3/uL (ref 0.0–0.1)
Basophils Relative: 0 %
Eosinophils Absolute: 0.1 10*3/uL (ref 0.0–0.7)
Eosinophils Relative: 1 %
HCT: 23.8 % — ABNORMAL LOW (ref 36.0–46.0)
HEMOGLOBIN: 7.4 g/dL — AB (ref 12.0–15.0)
LYMPHS ABS: 0.9 10*3/uL (ref 0.7–4.0)
LYMPHS PCT: 18 %
MCH: 29.2 pg (ref 26.0–34.0)
MCHC: 31.1 g/dL (ref 30.0–36.0)
MCV: 94.1 fL (ref 78.0–100.0)
Monocytes Absolute: 0.5 10*3/uL (ref 0.1–1.0)
Monocytes Relative: 10 %
NEUTROS PCT: 71 %
Neutro Abs: 3.6 10*3/uL (ref 1.7–7.7)
Platelets: 142 10*3/uL — ABNORMAL LOW (ref 150–400)
RBC: 2.53 MIL/uL — AB (ref 3.87–5.11)
RDW: 17.4 % — ABNORMAL HIGH (ref 11.5–15.5)
WBC: 5.1 10*3/uL (ref 4.0–10.5)

## 2015-03-21 LAB — CULTURE, BLOOD (ROUTINE X 2)
CULTURE: NO GROWTH
CULTURE: NO GROWTH
Culture: NO GROWTH

## 2015-03-21 LAB — GLUCOSE, CAPILLARY
GLUCOSE-CAPILLARY: 126 mg/dL — AB (ref 65–99)
GLUCOSE-CAPILLARY: 145 mg/dL — AB (ref 65–99)
GLUCOSE-CAPILLARY: 200 mg/dL — AB (ref 65–99)
Glucose-Capillary: 91 mg/dL (ref 65–99)

## 2015-03-21 LAB — PREPARE RBC (CROSSMATCH)

## 2015-03-21 MED ORDER — SODIUM CHLORIDE 0.9 % IV SOLN
Freq: Once | INTRAVENOUS | Status: AC
  Administered 2015-03-21: 15:00:00 via INTRAVENOUS

## 2015-03-21 MED ORDER — TAMSULOSIN HCL 0.4 MG PO CAPS
0.4000 mg | ORAL_CAPSULE | Freq: Every day | ORAL | Status: DC
Start: 2015-03-21 — End: 2015-03-22
  Administered 2015-03-21 – 2015-03-22 (×2): 0.4 mg via ORAL
  Filled 2015-03-21 (×2): qty 1

## 2015-03-21 MED ORDER — LEVETIRACETAM 500 MG PO TABS
1000.0000 mg | ORAL_TABLET | Freq: Two times a day (BID) | ORAL | Status: DC
  Administered 2015-03-21 – 2015-03-25 (×8): 1000 mg via ORAL
  Filled 2015-03-21 (×8): qty 2

## 2015-03-21 MED ORDER — ENSURE ENLIVE PO LIQD
237.0000 mL | Freq: Two times a day (BID) | ORAL | Status: DC
  Administered 2015-03-21 – 2015-03-25 (×5): 237 mL via ORAL

## 2015-03-21 MED ORDER — VITAMINS A & D EX OINT
TOPICAL_OINTMENT | CUTANEOUS | Status: AC
  Administered 2015-03-21: 11:00:00
  Filled 2015-03-21: qty 5

## 2015-03-21 MED ORDER — ARTIFICIAL TEARS OP OINT
1.0000 "application " | TOPICAL_OINTMENT | OPHTHALMIC | Status: DC | PRN
  Administered 2015-03-21: 1 via OPHTHALMIC
  Filled 2015-03-21: qty 3.5

## 2015-03-21 NOTE — Progress Notes (Signed)
[facility] Progress Note  Patient: Brittney Cunningham [id number]   PCP: [name], MD DOB: [birth year]   DOA: 03/02/2015   DOS: 03/21/2015   Date of Service: the patient was seen and examined on 03/21/2015  Subjective: Patient complains of burning of her eyelids. Was given Ativan last night and has remained drowsy and lethargic. Had urinary retention. Nutrition: Tolerating oral diet Activity: Out of bed to chair with assistance Last BM: 03/19/2015  Assessment and Plan: 1. Sepsis ([place]) Initial admission for sepsis due to healthcare associated pneumonia. As well as ESBL UTI. Patient treated with fosfomycin for ESBL and has been treated with broad-spectrum antibiotics and completed the course of treatment. Currently no fever no leukocytosis.  2. PEA arrest  Patient had new onset seizure which was followed by PEA arrest. Development 03/16/2015. Patient was intubated and was in the ICU care. Extubated on 03/18/2015. Likely secondary to hypoxia as well as seizure. Currently stable. Her CODE STATUS in the interim has been changed from DO NOT RESUSCITATE to partial code as per discussion with the family.  3. New onset seizure. 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 Management per oncology. Seizures are more likely secondary to dural involvement of the malignancy. Currently continuing Keppra. Ativan when necessary with seizure prophylaxis.  4. Acute combined CHF. Elevated troponin. Aspect of ischemic cardiomyopathy with severe MR Multifocal atrial tachycardia Cardiology was consulted. Ejection fraction 30-35% with akinesis of basal mid inferior myocardium. Patient was recommended to be treated conservatively given her metastatic cancer. Currently on aspirin and Plavix. Also on Lipitor as well as carvedilol. No ongoing chest pain.  5. Hypothyroidism. Continuing Synthroid.  6. Chronic pain syndrome. On morphine as needed.  7. Diabetes mellitus type 2. On sliding scale  insulin as well as Levemir. Will continue to monitor.  8. Anemia of chronic disease. H&H stabilizing at present. Although given her weakness as well as shortness of breath and fatigue with CHF I would transfuse her. Maintain H&H of 08. No active bleeding.  DVT Prophylaxis: subcutaneous Heparin Nutrition: Cardiac diet carb modified Advance goals of care discussion: Partial  Consultants: Cardiology, pulmonary, neurology Antibiotics: Anti-infectives    Start     Dose/Rate Route Frequency Ordered Stop   03/17/15 1600  levofloxacin (LEVAQUIN) IVPB 750 mg  Status:  Discontinued     750 mg 100 mL/hr over 90 Minutes Intravenous Every 24 hours 03/16/15 1037 03/16/15 1038   03/17/15 1600  levofloxacin (LEVAQUIN) IVPB 750 mg  Status:  Discontinued     750 mg 100 mL/hr over 90 Minutes Intravenous Every 48 hours 03/16/15 1038 03/16/15 1040   03/15/15 1400  levofloxacin (LEVAQUIN) IVPB 750 mg  Status:  Discontinued     750 mg 100 mL/hr over 90 Minutes Intravenous Every 24 hours 03/15/15 1329 03/16/15 1037   03/13/15 0200  vancomycin (VANCOCIN) 500 mg in sodium chloride 0.9 % 100 mL IVPB  Status:  Discontinued     500 mg 100 mL/hr over 60 Minutes Intravenous Every 12 hours 03/13/15 0045 03/15/15 1221   03/10/15 1400  imipenem-cilastatin (PRIMAXIN) 500 mg in sodium chloride 0.9 % 100 mL IVPB  Status:  Discontinued     500 mg 200 mL/hr over 30 Minutes Intravenous 3 times per day 03/10/15 1152 03/15/15 1221   03/10/15 1200  vancomycin (VANCOCIN) IVPB 750 mg/150 ml premix  Status:  Discontinued     750 mg 150 mL/hr over 60 Minutes Intravenous Every 12 hours 03/10/15 1152 03/13/15 0045   03/09/15 2200  azithromycin (  ZITHROMAX) tablet 500 mg  Status:  Discontinued     500 mg Oral Daily at bedtime 03/09/15 0802 03/10/15 1053   03/06/15 2100  azithromycin (ZITHROMAX) 500 mg in dextrose 5 % 250 mL IVPB  Status:  Discontinued     500 mg 250 mL/hr over 60 Minutes Intravenous Every 24 hours 03/06/15 1921  03/09/15 0802   03/06/15 2000  cefTRIAXone (ROCEPHIN) 1 g in dextrose 5 % 50 mL IVPB  Status:  Discontinued     1 g 100 mL/hr over 30 Minutes Intravenous Every 24 hours 03/06/15 1921 03/10/15 1053   03/05/15 1800  imipenem-cilastatin (PRIMAXIN) 250 mg in sodium chloride 0.9 % 100 mL IVPB  Status:  Discontinued     250 mg 200 mL/hr over 30 Minutes Intravenous 4 times per day 03/05/15 1724 03/06/15 1444   03/03/15 1200  vancomycin (VANCOCIN) IVPB 1000 mg/200 mL premix  Status:  Discontinued     1,000 mg 200 mL/hr over 60 Minutes Intravenous Every 24 hours 03/02/15 1215 03/05/15 1643   03/02/15 1800  piperacillin-tazobactam (ZOSYN) IVPB 3.375 g  Status:  Discontinued     3.375 g 12.5 mL/hr over 240 Minutes Intravenous Every 8 hours 03/02/15 1215 03/05/15 1643   03/02/15 1145  piperacillin-tazobactam (ZOSYN) IVPB 3.375 g     3.375 g 100 mL/hr over 30 Minutes Intravenous  Once 03/02/15 1132 03/02/15 1225   03/02/15 1145  vancomycin (VANCOCIN) IVPB 1000 mg/200 mL premix     1,000 mg 200 mL/hr over 60 Minutes Intravenous  Once 03/02/15 1132 03/02/15 1355      Family Communication: family was present at bedside, at the time of interview.  Opportunity was given to ask question and all questions were answered satisfactorily.   Disposition:  Barriers to safe discharge: Improvement in respiratory status   Intake/Output Summary (Last 24 hours) at 03/21/15 1449 Last data filed at 03/21/15 0700  Gross per 24 hour  Intake    390 ml  Output   1300 ml  Net   -910 ml   Filed Weights   03/20/15 1500 03/20/15 1905 03/21/15 0500  Weight: 76.9 kg (169 lb 8.5 oz) 77.1 kg (169 lb 15.6 oz) 77.2 kg (170 lb 3.1 oz)   Objective: Physical Exam: Filed Vitals:   03/21/15 1000 03/21/15 1346 03/21/15 1355 03/21/15 1435  BP:  125/30 130/48 111/36  Pulse:  75  78  Temp:    98.2 F (36.8 C)  TempSrc:  Oral  Axillary  Resp: 18 18  24   Height:      Weight:      SpO2:  100%  100%    General: Appear in  mild distress, no Rash; Oral Mucosa moist. Cardiovascular: S1 and S2 Present, aortic systolic Murmur, no JVD Respiratory: Bilateral Air entry present and faint basal Crackles, no wheezes Abdomen: Bowel Sound present, Soft and no tenderness Extremities: no Pedal edema, no calf tenderness Neurology: Grossly no focal neuro deficit.  Data Reviewed: CBC:  Recent Labs Lab 03/16/15 0825  03/18/15 0322 03/19/15 0550 03/20/15 0400 03/20/15 1335 03/21/15 0406  WBC 16.7*  < > 7.7 9.3 5.8 5.8 5.1  NEUTROABS 9.7*  --  6.1 7.4 4.2  --  3.6  HGB 8.6*  < > 7.5* 8.1* 7.4* 7.5* 7.4*  HCT 28.6*  < > 25.0* 26.9* 24.0* 24.2* 23.8*  MCV 95.7  < > 95.4 96.4 96.0 93.8 94.1  PLT 151  < > 130* 171 135* 149* 142*  < > = values  in this interval not displayed. Basic Metabolic Panel:  Recent Labs Lab 03/16/15 0535  03/17/15 0026 03/17/15 0452 03/18/15 0322 03/19/15 0550 03/20/15 0400 03/21/15 0406  NA 144  < > 142 144 142 143 142 144  K 4.0  < > 3.9 3.9 3.8 3.7 3.6 3.4*  CL 103  < > 106 108 105 106 106 105  CO2 25  < > 24 25 22 24 27 29   GLUCOSE 147*  < > 125* 126* 85 78 113* 95  BUN 19  < > 19 19 22* 19 19 17   CREATININE 0.97  < > 1.05* 1.12* 1.00 0.86 0.82 0.80  CALCIUM 7.9*  < > 7.2* 7.3* 7.5* 7.6* 7.4* 7.5*  MG 1.5*  --  1.9 1.9 1.8  --   --   --   PHOS  --   --   --   --  4.2 3.9 3.3 3.0  < > = values in this interval not displayed. Liver Function Tests:  Recent Labs Lab 03/15/15 0530 03/16/15 0825 03/18/15 0322 03/19/15 0550 03/20/15 0400 03/21/15 0406  AST 47* 58*  --   --   --   --   ALT 7* 8*  --   --   --   --   ALKPHOS 141* 150*  --   --   --   --   BILITOT 0.9 0.9  --   --   --   --   PROT 4.9* 5.3*  --   --   --   --   ALBUMIN 2.3* 2.3* 2.4* 2.5* 2.4* 2.4*   No results for input(s): LIPASE, AMYLASE in the last 168 hours. No results for input(s): AMMONIA in the last 168 hours.  Cardiac Enzymes:  Recent Labs Lab 03/16/15 0825  TROPONINI 0.12*    BNP (last 3  results)  Recent Labs  08/07/14 1158 08/10/14 1715 08/24/14 1042  BNP 343.0* 354.4* 478.7*    CBG:  Recent Labs Lab 03/20/15 1217 03/20/15 1704 03/20/15 2138 03/21/15 0820 03/21/15 1238  GLUCAP 134* 125* 146* 91 126*    Recent Results (from the past 240 hour(s))  Culture, blood (Routine X 2) w Reflex to ID Panel     Status: None (Preliminary result)   Collection Time: 03/16/15  8:25 AM  Result Value Ref Range Status   Specimen Description BLOOD RIGHT WRIST  Final   Special Requests BOTTLES DRAWN AEROBIC AND ANAEROBIC 5CC  Final   Culture   Final    NO GROWTH 4 DAYS Performed at [facility]    Report Status PENDING  Incomplete  Culture, blood (routine x 2)     Status: None (Preliminary result)   Collection Time: 03/16/15 10:34 AM  Result Value Ref Range Status   Specimen Description BLOOD LEFT HAND  Final   Special Requests IN PEDIATRIC BOTTLE 3CC  Final   Culture   Final    NO GROWTH 4 DAYS Performed at [facility]    Report Status PENDING  Incomplete  Culture, blood (Routine X 2) w Reflex to ID Panel     Status: None (Preliminary result)   Collection Time: 03/16/15 10:35 AM  Result Value Ref Range Status   Specimen Description BLOOD LEFT ARM  Final   Special Requests IN PEDIATRIC BOTTLE 3CC  Final   Culture   Final    NO GROWTH 4 DAYS Performed at [facility]    Report Status PENDING  Incomplete  Urine  culture     Status: None   Collection Time: 03/16/15  1:14 PM  Result Value Ref Range Status   Specimen Description URINE, CATHETERIZED  Final   Special Requests NONE  Final   Culture   Final    NO GROWTH 1 DAY Performed at [facility]    Report Status 03/17/2015 FINAL  Final     Studies: No results found.   Scheduled Meds: . aspirin  81 mg Oral Daily  . atorvastatin  80 mg Oral Daily  . calcium carbonate  1 tablet Oral TID WC  . carvedilol  15.625 mg Oral BID WC  . clopidogrel  75 mg Oral Daily  . feeding  supplement (ENSURE ENLIVE)  237 mL Oral BID BM  . furosemide  40 mg Oral Daily  . heparin subcutaneous  5,000 Units Subcutaneous 3 times per day  . insulin aspart  0-5 Units Subcutaneous QHS  . insulin aspart  0-9 Units Subcutaneous TID WC  . levETIRAcetam  1,000 mg Oral BID  . levothyroxine  150 mcg Oral QAC breakfast  . mirtazapine  7.5 mg Oral QHS  . pantoprazole  40 mg Oral Daily  . sodium chloride flush  10-40 mL Intracatheter Q12H  . tamsulosin  0.4 mg Oral Daily   Continuous Infusions: . sodium chloride 10 mL/hr at 03/20/15 1500   PRN Meds: acetaminophen **OR** acetaminophen, artificial tears, ipratropium-albuterol, morphine, ondansetron **OR** ondansetron (ZOFRAN) IV, sodium chloride flush  Time spent: 30 minutes  Author: Berle Mull, MD [facility] Pager: [phone] 03/21/2015 2:49 PM  If 7PM-7AM, please contact night-coverage at [email or web address], password [facility]

## 2015-03-21 NOTE — Progress Notes (Signed)
Brittney Cunningham   DOB:03/10/1940   SN#:[id number]   LPF#:[id number]  Subjective:  Brittney Cunningham is pretty groggy this AM (likely secondary to lorazepam) but recognizes me. Sister in room.  Objective: older White woman examined in bed  Filed Vitals:   03/21/15 0400 03/21/15 0437  BP: 124/55   Pulse: 122 89  Temp: 98 F (36.7 C)   Resp: 18     Body mass index is 30.16 kg/(m^2).  Intake/Output Summary (Last 24 hours) at 03/21/15 1029 Last data filed at 03/21/15 0700  Gross per 24 hour  Intake    430 ml  Output   2100 ml  Net  -1670 ml    Sclerae not icteric  Lungs no rales or wheezes -- auscultated anterolaterally  Heart RRR  Breast exam: deferred  CBG (last 3)   Recent Labs  03/20/15 1704 03/20/15 2138 03/21/15 0820  GLUCAP 125* 146* 91     Labs:  Lab Results  Component Value Date   WBC 5.1 03/21/2015   HGB 7.4* 03/21/2015   HCT 23.8* 03/21/2015   MCV 94.1 03/21/2015   PLT 142* 03/21/2015   NEUTROABS 3.6 03/21/2015    '@LASTCHEMISTRY'$ @  Urine Studies No results for input(s): UHGB, CRYS in the last 72 hours.  Invalid input(s): UACOL, UAPR, USPG, UPH, UTP, UGL, UKET, UBIL, UNIT, UROB, ULEU, UEPI, UWBC, Brittney Cunningham, [place], [place]  Basic Metabolic Panel:  Recent Labs Lab 03/16/15 0535  03/17/15 0026 03/17/15 0452 03/18/15 0322 03/19/15 0550 03/20/15 0400 03/21/15 0406  NA 144  < > 142 144 142 143 142 144  K 4.0  < > 3.9 3.9 3.8 3.7 3.6 3.4*  CL 103  < > 106 108 105 106 106 105  CO2 25  < > '24 25 22 24 27 29  '$ GLUCOSE 147*  < > 125* 126* 85 78 113* 95  BUN 19  < > 19 19 22* '19 19 17  '$ CREATININE 0.97  < > 1.05* 1.12* 1.00 0.86 0.82 0.80  CALCIUM 7.9*  < > 7.2* 7.3* 7.5* 7.6* 7.4* 7.5*  MG 1.5*  --  1.9 1.9 1.8  --   --   --   PHOS  --   --   --   --  4.2 3.9 3.3 3.0  < > = values in this interval not displayed. GFR Estimated Creatinine Clearance: 62.5 mL/min (by C-G formula based on Cr of 0.8). Liver Function Tests:  Recent Labs Lab 03/15/15 0530  03/16/15 0825 03/18/15 0322 03/19/15 0550 03/20/15 0400 03/21/15 0406  AST 47* 58*  --   --   --   --   ALT 7* 8*  --   --   --   --   ALKPHOS 141* 150*  --   --   --   --   BILITOT 0.9 0.9  --   --   --   --   PROT 4.9* 5.3*  --   --   --   --   ALBUMIN 2.3* 2.3* 2.4* 2.5* 2.4* 2.4*   No results for input(s): LIPASE, AMYLASE in the last 168 hours. No results for input(s): AMMONIA in the last 168 hours. Coagulation profile  Recent Labs Lab 03/16/15 0825  INR 1.35    CBC:  Recent Labs Lab 03/16/15 0825  03/18/15 0322 03/19/15 0550 03/20/15 0400 03/20/15 1335 03/21/15 0406  WBC 16.7*  < > 7.7 9.3 5.8 5.8 5.1  NEUTROABS 9.7*  --  6.1 7.4 4.2  --  3.6  HGB 8.6*  < > 7.5* 8.1* 7.4* 7.5* 7.4*  HCT 28.6*  < > 25.0* 26.9* 24.0* 24.2* 23.8*  MCV 95.7  < > 95.4 96.4 96.0 93.8 94.1  PLT 151  < > 130* 171 135* 149* 142*  < > = values in this interval not displayed. Cardiac Enzymes:  Recent Labs Lab 03/16/15 0825  TROPONINI 0.12*   BNP: Invalid input(s): POCBNP CBG:  Recent Labs Lab 03/20/15 0759 03/20/15 1217 03/20/15 1704 03/20/15 2138 03/21/15 0820  GLUCAP 116* 134* 125* 146* 91   D-Dimer No results for input(s): DDIMER in the last 72 hours. Hgb A1c No results for input(s): HGBA1C in the last 72 hours. Lipid Profile No results for input(s): CHOL, HDL, LDLCALC, TRIG, CHOLHDL, LDLDIRECT in the last 72 hours. Thyroid function studies No results for input(s): TSH, T4TOTAL, T3FREE, THYROIDAB in the last 72 hours.  Invalid input(s): FREET3 Anemia work up No results for input(s): VITAMINB12, FOLATE, FERRITIN, TIBC, IRON, RETICCTPCT in the last 72 hours. Microbiology Recent Results (from the past 240 hour(s))  Culture, blood (Routine X 2) w Reflex to ID Panel     Status: None (Preliminary result)   Collection Time: 03/16/15  8:25 AM  Result Value Ref Range Status   Specimen Description BLOOD RIGHT WRIST  Final   Special Requests BOTTLES DRAWN AEROBIC AND  ANAEROBIC 5CC  Final   Culture   Final    NO GROWTH 4 DAYS Performed at [facility]    Report Status PENDING  Incomplete  Culture, blood (routine x 2)     Status: None (Preliminary result)   Collection Time: 03/16/15 10:34 AM  Result Value Ref Range Status   Specimen Description BLOOD LEFT HAND  Final   Special Requests IN PEDIATRIC BOTTLE 3CC  Final   Culture   Final    NO GROWTH 4 DAYS Performed at [facility]    Report Status PENDING  Incomplete  Culture, blood (Routine X 2) w Reflex to ID Panel     Status: None (Preliminary result)   Collection Time: 03/16/15 10:35 AM  Result Value Ref Range Status   Specimen Description BLOOD LEFT ARM  Final   Special Requests IN PEDIATRIC BOTTLE 3CC  Final   Culture   Final    NO GROWTH 4 DAYS Performed at [facility]    Report Status PENDING  Incomplete  Urine culture     Status: None   Collection Time: 03/16/15  1:14 PM  Result Value Ref Range Status   Specimen Description URINE, CATHETERIZED  Final   Special Requests NONE  Final   Culture   Final    NO GROWTH 1 DAY Performed at [facility]    Report Status 03/17/2015 FINAL  Final      Studies:  Dg Chest 2 View  03/02/2015  CLINICAL DATA:  Shortness of breath, cough and congestion since Sunday, history coronary artery disease, chronic renal insufficiency, coronary artery disease, hypertension, type II diabetes mellitus, metastatic breast cancer EXAM: CHEST  2 VIEW COMPARISON:  09/21/2014 FINDINGS: RIGHT subclavian Port-A-Cath with tip projecting over SVC. Normal heart size, mediastinal contours, and pulmonary vascularity. Atherosclerotic calcification aorta. Eventration of the anterior RIGHT diaphragm. Increased markings at anterior RIGHT lung base question chronic atelectasis. Remaining lungs grossly clear. No pleural effusion or pneumothorax. Diffuse osteosclerosis again identified, patchy, likely related to known osseous metastatic disease.  IMPRESSION: Chronic RIGHT basilar atelectasis. No definite acute pulmonary abnormalities. Diffuse osseous metastatic disease.  Electronically Signed   By: Lavonia Dana M.D.   On: 03/02/2015 12:33   Ct Head Wo Contrast  03/16/2015  CLINICAL DATA:  Seizure, stage IV breast cancer. EXAM: CT HEAD WITHOUT CONTRAST TECHNIQUE: Contiguous axial images were obtained from the base of the skull through the vertex without intravenous contrast. COMPARISON:  CT scan of July 01, 2009. FINDINGS: Bilateral maxillary sinusitis is noted. Lucencies are noted throughout the skull which may represent metastatic disease. Minimal diffuse cortical atrophy is noted. Minimal chronic ischemic white matter disease is noted. No mass effect or midline shift is noted. Ventricular size is within normal limits. There is no evidence of mass lesion, hemorrhage or acute infarction. IMPRESSION: Lucencies are noted throughout the skull concerning for metastatic disease as noted on prior exam. Minimal diffuse cortical atrophy and chronic ischemic white matter disease. No acute intracranial abnormality seen. Electronically Signed   By: Marijo Conception, M.D.   On: 03/16/2015 09:22   Ct Chest Wo Contrast  03/06/2015  CLINICAL DATA:  Dyspnea. Dizziness, fatigue, and upper respiratory symptoms. Tachycardia, tachypnea, and fever. Metastatic right breast cancer currently undergoing chemotherapy. EXAM: CT CHEST WITHOUT CONTRAST TECHNIQUE: Multidetector CT imaging of the chest was performed following the standard protocol without IV contrast. COMPARISON:  01/12/2015 FINDINGS: There is new diffuse body wall edema and on organized subcutaneous fluid, greatest in the right lateral chest wall and incompletely visualized. Sequelae of right mastectomy are again identified. A right subclavian Port-A-Cath remains in place and terminates at the cavoatrial junction. Diffuse aortic and coronary artery calcifications are noted. The heart is normal in size. No enlarged  axillary, mediastinal, or hilar lymph nodes are identified. Small left axillary lymph nodes measure up to 9 mm in short axis. Small mediastinal lymph nodes measure up to 8 mm in short axis. There are new, small bilateral pleural effusions with overlying dependent atelectasis and/or consolidation in the lower lobes. Emphysematous changes are greatest in the right upper lobe. There is new ground-glass and patchy consolidative opacity in the posterior right upper lobe. Small nodules along the right major fissure on the prior study are not well seen on this examination due to adjacent lung opacity and motion artifact. Subpleural reticulation is noted in the anterior right upper and right middle lobes related to prior radiation therapy. 5 mm left lower lobe nodule is unchanged (series 6, image 37). Small nodules along the left major fissure unchanged. Other small nodules seen on the prior study as well as plaque-like pleural thickening in the right lower lobe are obscured on the current examination by parenchymal consolidation/atelectasis and motion artifact. Small focus of residual opacity in the lingula on the prior study has resolved. The numerous liver lesions described on the prior study are not well seen on this noncontrast examination. Calcified atherosclerosis is noted in the upper abdomen. Sclerotic osseous metastases are again seen throughout the visualized skeleton with multiple nonacute rib fractures again noted. IMPRESSION: 1. Posterior right upper lobe opacity compatible with pneumonia. 2. Small bilateral pleural effusions with bilateral lower lobe atelectasis/consolidation. 3. Multiple previously described lung nodules are obscured on the current examination due to the above acute changes. The 5 mm left lower lobe nodule is unchanged. 4. Similar appearance of diffuse osseous metastases. Electronically Signed   By: Logan Bores M.D.   On: 03/06/2015 16:41   Ct Angio Chest Pe W/cm &/or Wo Cm  03/16/2015   CLINICAL DATA:  Post arrest, post intubation. Stage IV right breast cancer, unresponsive. EXAM: CT ANGIOGRAPHY  CHEST WITH CONTRAST TECHNIQUE: Multidetector CT imaging of the chest was performed using the standard protocol during bolus administration of intravenous contrast. Multiplanar CT image reconstructions and MIPs were obtained to evaluate the vascular anatomy. CONTRAST:  OMNIPAQUE IOHEXOL 350 MG/ML SOLN COMPARISON:  03/06/2015 FINDINGS: There is cardiomegaly. Aorta is normal caliber. Densely calcified coronary arteries diffusely. No filling defects in the pulmonary arteries to suggest pulmonary emboli. There are moderate bilateral pleural effusions, increasing since prior study. Probable compressive atelectasis in the lower lobes bilaterally. Mild ground-glass opacities throughout the lungs could reflect mild edema. No mediastinal, hilar, or axillary adenopathy. Previously seen nodularity in the lungs not well visualized due to airspace disease and ground-glass opacities. Diffuse sclerotic metastases throughout the visualized bony thorax. Right Port-A-Cath is in place with the tip in the SVC. Previously described numerous liver lesions cannot be visualized on this chest CTA. Review of the MIP images confirms the above findings. IMPRESSION: No evidence of pulmonary embolus. Moderate bilateral pleural effusions, enlarging since prior study. Probable compressive atelectasis versus consolidation in the lower lobes. Cardiomegaly.  Mild ground-glass opacities may reflect early edema. Diffuse coronary artery disease. Diffuse sclerotic bony metastases. Electronically Signed   By: Charlett Nose M.D.   On: 03/16/2015 09:37   Mr Brain Wo Contrast  03/16/2015  CLINICAL DATA:  Metastatic breast cancer with possible seizure. EXAM: MRI HEAD WITHOUT CONTRAST TECHNIQUE: Multiplanar, multiecho pulse sequences of the brain and surrounding structures were obtained without intravenous contrast. COMPARISON:  None. FINDINGS:  Partial study (only sagittal T1 and diffusion imaging obtained) due to seizure during image acquisition. The available images are motion degraded. There is no evidence of infarct, hydrocephalus, or shift. Diffuse marrow hypo intensity correlating with history of widespread osseous metastases. Chronic sinusitis with diffuse mucosal thickening in the maxillary sinuses. IMPRESSION: 1. Incomplete study due to seizure. 2. Negative for acute infarct. Electronically Signed   By: Marnee Spring M.D.   On: 03/16/2015 15:54   Mr Laqueta Jean BM Contrast  03/17/2015  ADDENDUM REPORT: 03/17/2015 11:44 ADDENDUM: 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 Electronically Signed   By: Sebastian Ache M.D.   On: 03/17/2015 11:44  03/17/2015  CLINICAL DATA:  Possible seizure. History of metastatic breast cancer. EXAM: MRI HEAD WITHOUT AND WITH CONTRAST TECHNIQUE: Multiplanar, multiecho pulse sequences of the brain and surrounding structures were obtained without and with intravenous contrast. CONTRAST:  44mL MULTIHANCE GADOBENATE DIMEGLUMINE 529 MG/ML IV SOLN COMPARISON:  Incomplete brain MRI 03/16/2015.  Head CT 03/16/2015. FINDINGS: Dedicated thin section imaging through the temporal lobes demonstrates symmetric volume of the hippocampi without signal abnormality identified. There is no evidence of acute infarct, intracranial hemorrhage, mass, midline shift, or extra-axial fluid collection. There is mild generalized cerebral atrophy. T2 hyperintensities in the subcortical and periventricular white matter are compatible with mild chronic small vessel ischemic disease. No abnormal brain parenchymal enhancement or leptomeningeal enhancement is identified. There is mild diffuse pachymeningeal enhancement bilaterally. This is largely thin and smooth in nature, however there is mild nodularity most notable in the right frontal region measuring 4-5 mm in thickness. There is moderate circumferential mucosal thickening in the maxillary sinuses with mild  thickening in a posterior left ethmoid air cell. A trace left mastoid effusion is noted. Major intracranial vascular flow voids are preserved. Diffusely abnormal bone marrow signal is again noted and consistent with widespread osseous metastases. IMPRESSION: 1. No acute infarct. 2. No evidence of parenchymal brain metastases. 3. Diffuse pachymeningeal enhancement. This may represent a combination of reactive  dural thickening related to widespread skull metastases as well as dural involvement by tumor, particularly in the right frontal region. 4. Mild chronic small vessel ischemic disease and cerebral atrophy. Electronically Signed: By: Logan Bores M.D. On: 03/17/2015 11:39   Dg Chest Port 1 View  03/17/2015  CLINICAL DATA:  Respiratory failure EXAM: PORTABLE CHEST 1 VIEW COMPARISON:  Portable chest x-ray of March 16, 2015 FINDINGS: The lungs are well-expanded. The hemidiaphragms remain obscured. The interstitial markings remain increased. The cardiac silhouette remains enlarged. The pulmonary vascularity is not engorged. The endotracheal tube tip lies approximately 3.7 cm above the carina. The esophagogastric tube tip projects below the inferior margin of the image. The Port-A-Cath appliance tip projects over the midportion of the SVC. IMPRESSION: Allowing for differences in positioning there has not been significant interval change since the previous study. Persistent bibasilar atelectasis or pneumonia with small bilateral pleural effusions. Stable cardiomegaly without significant pulmonary vascular congestion. Electronically Signed   By: David  Martinique M.D.   On: 03/17/2015 07:22   Dg Chest Port 1 View  03/16/2015  CLINICAL DATA:  Could in this morning.  Evaluate ET tube placement EXAM: PORTABLE CHEST 1 VIEW COMPARISON:  03/10/2015 FINDINGS: Endotracheal tube is 3.6 cm above the carina. Right Port-A-Cath tip at the cavoatrial junction. Heart is upper limits normal in size. Bilateral lower lobe airspace  opacities and probable layering effusions again noted, similar prior study. IMPRESSION: Bilateral lower lobe airspace opacities and small layering effusions, similar to prior study. Endotracheal tube 3.6 cm above the carina. Electronically Signed   By: Rolm Baptise M.D.   On: 03/16/2015 10:18   Dg Chest Port 1 View  03/10/2015  CLINICAL DATA:  Pneumonia EXAM: PORTABLE CHEST 1 VIEW COMPARISON:  03/06/2015 FINDINGS: Cardiac shadow is stable in appearance. A right chest wall port is seen. Scattered patchy infiltrates are noted throughout both lungs similar to that seen on recent CT examination. Small bilateral pleural effusions right greater than left are again noted. Changes consistent with metastatic disease in the bone are again noted. IMPRESSION: No change in patchy bilateral pneumonia. Bilateral pleural effusions are seen. Stable bony metastatic disease. Electronically Signed   By: Inez Catalina M.D.   On: 03/10/2015 12:48   Dg Abd Portable 1v  03/17/2015  CLINICAL DATA:  Orogastric tube placement. EXAM: PORTABLE ABDOMEN - 1 VIEW COMPARISON:  CT abdomen and pelvis 04/13/2012.  Abdomen 10/26/2011. FINDINGS: Enteric tube tip is projected over the lower mid abdomen consistent with location in the distal stomach. Scattered gas in the colon without small or large bowel distention. Diffuse heterogeneous bone sclerosis and lucency consistent with diffuse bone metastasis. Residual contrast material noted in the bladder. IMPRESSION: Enteric tube tip projects over the lower mid abdomen, likely in the distal stomach. Diffuse skeletal metastases. Electronically Signed   By: Lucienne Capers M.D.   On: 03/17/2015 01:01    Assessment: 75 y.o. [place] woman:   (1) Status post right breast biopsy in 05/2009 for a grade 2 invasive ductal carcinoma, T2 NX M1, Stage IV, strongly estrogen and progesterone receptor-positive, HER-2-negative with an MIB-1 of 26%.  (2) Neoadjuvantly she received Letrozole and zoledronic  acid beginning in June of 2011 and underwent right modified radical mastectomy February of 2012 for a ypT2 ypN2, grade 1 invasive ductal carcinoma with negative margins.   (3) She completed radiation therapy in August of 2012.   (4) She has continued on Letrozole but was switched from Zoledronic acid to Denosumab Delton See) because of  concerns regarding her serum creatinine. Delton See was being given every 8 weeks.  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  (6) symptomatic anemia, with creatinine clearance < 60 cc/min; Darbepoietin Q14d started 11/03/2011; received Feraheme 01/05/2012; ferritin August 2016 was greater than 800  (7) On subcutaneous B-12 supplementation monthly.  (8) Pain from osseous metastasis, osteoarthritis, tendinopathy, and bursitis as confirmed by recent MRI.  (9) Anemia, multifactorial with renal disease, on Aranesp monthly, increased to every 2 weeks June 2016  (10) letrozole was discontinued in September 2014 with evidence of disease progression. She was started on fulvestrant injections, first given on 10/17/2012; stopped 06/03/2014 with progression  (11) exemestane and everolimus started 07/08/2014 (a) everolimus discontinued August 2016 with pneumonitis (b) exemestane held at the start of everolimus January 2017  (12) liver biopsy 02/05/2015 confirms metastatic breast cancer to the liver, estrogen and progesterone receptor positive, HER-2 negative  (13) eribulin started 02/17/2015, to be given days 1 and 8 of each 21 day cycle, with neulasta day 9  (a) cycle 2 (originally scheduled for 03/10/2015) held until patient back to baseline and outpatient  (14) seizure 03/16/2015, with no evidence of CVA or CNS metastases, but 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 Agree w plan for transfusion. As far as future cancer treatment, we will have to evaluate her functional statu in 2-3 weeks but given the new cardiac problems choices are limited.  Will follow with you.  Chauncey Cruel, MD 03/21/2015  10:29 AM Medical Oncology and Hematology [facility] [address] [place], [place] [address] Tel. [phone]    Fax. [phone]

## 2015-03-21 NOTE — Progress Notes (Signed)
The patient is receiving Keppra by the intravenous route.  Based on criteria approved by the Pharmacy and [place], the medication is being converted to the equivalent oral dose form.  These criteria include: -No Active GI bleeding -Able to tolerate diet of full liquids (or better) or tube feeding OR able to tolerate other medications by the oral or enteral route  If you have any questions about this conversion, please contact the Pharmacy Department (ext [phone]).  Thank you.  Biagio Borg, [facility] 03/21/2015 12:40 PM

## 2015-03-21 NOTE — Progress Notes (Signed)
Pt asked to have Ativan tonight for bedtime to help her sleep because she said she hadn't had any sleep the whole time she's been here. Immediately after she took the Ativan she fell asleep quickly. During the night her heart monitor and oxygen level has maintained good readings and she's been in sinus rhythm all night with occasional PVC's and PAC's. During the night she has been moving her feet and wiggling a little in the bed. Around 0400 when we were getting morning vitals and checking to see if she had gone to the bathroom at all, the pt was very lethargic. I checked vital signs and they were all stable and within normal range, heart rate was in sinus rhythm, and oxygen was at 100% on 2L Nasal Cannula. After letting my charge nurse know we assessed the pt and sternal rubbed her and she woke up and said "ouch" and was able to squeeze both hands. In my opinion, pt shouldn't really have the Ativan if it makes her pass out like she did. Had I known that she would react to the medication like this I would not have given it to her. I will be letting the day shift nurse know and asking the day shift nurse to let the night shift nurse know too. Carmela Hurt, RN 4:24 AM 03/21/2015

## 2015-03-22 LAB — CBC WITH DIFFERENTIAL/PLATELET
Basophils Absolute: 0 K/uL (ref 0.0–0.1)
Basophils Relative: 1 %
Eosinophils Absolute: 0.1 K/uL (ref 0.0–0.7)
Eosinophils Relative: 2 %
HCT: 28.4 % — ABNORMAL LOW (ref 36.0–46.0)
Hemoglobin: 8.8 g/dL — ABNORMAL LOW (ref 12.0–15.0)
Lymphocytes Relative: 23 %
Lymphs Abs: 1.3 K/uL (ref 0.7–4.0)
MCH: 28.6 pg (ref 26.0–34.0)
MCHC: 31 g/dL (ref 30.0–36.0)
MCV: 92.2 fL (ref 78.0–100.0)
Monocytes Absolute: 0.7 K/uL (ref 0.1–1.0)
Monocytes Relative: 12 %
Neutro Abs: 3.5 K/uL (ref 1.7–7.7)
Neutrophils Relative %: 62 %
Platelets: 157 K/uL (ref 150–400)
RBC: 3.08 MIL/uL — ABNORMAL LOW (ref 3.87–5.11)
RDW: 18.7 % — ABNORMAL HIGH (ref 11.5–15.5)
WBC: 5.6 K/uL (ref 4.0–10.5)

## 2015-03-22 LAB — TYPE AND SCREEN
ABO/RH(D): O NEG
ANTIBODY SCREEN: NEGATIVE
UNIT DIVISION: 0

## 2015-03-22 LAB — MAGNESIUM
MAGNESIUM: 1.3 mg/dL — AB (ref 1.7–2.4)
MAGNESIUM: 1.8 mg/dL (ref 1.7–2.4)

## 2015-03-22 LAB — RENAL FUNCTION PANEL
Albumin: 2.4 g/dL — ABNORMAL LOW (ref 3.5–5.0)
Anion gap: 10 (ref 5–15)
BUN: 17 mg/dL (ref 6–20)
CO2: 30 mmol/L (ref 22–32)
Calcium: 7.7 mg/dL — ABNORMAL LOW (ref 8.9–10.3)
Chloride: 103 mmol/L (ref 101–111)
Creatinine, Ser: 0.75 mg/dL (ref 0.44–1.00)
GFR calc Af Amer: >60 mL/min
GFR calc non Af Amer: >60 mL/min
Glucose, Bld: 120 mg/dL — ABNORMAL HIGH (ref 65–99)
Phosphorus: 2.4 mg/dL — ABNORMAL LOW (ref 2.5–4.6)
Potassium: 3.3 mmol/L — ABNORMAL LOW (ref 3.5–5.1)
Sodium: 143 mmol/L (ref 135–145)

## 2015-03-22 LAB — T4, FREE: Free T4: 0.8 ng/dL (ref 0.61–1.12)

## 2015-03-22 LAB — GLUCOSE, CAPILLARY
Glucose-Capillary: 122 mg/dL — ABNORMAL HIGH (ref 65–99)
Glucose-Capillary: 136 mg/dL — ABNORMAL HIGH (ref 65–99)
Glucose-Capillary: 136 mg/dL — ABNORMAL HIGH (ref 65–99)
Glucose-Capillary: 158 mg/dL — ABNORMAL HIGH (ref 65–99)

## 2015-03-22 LAB — PHOSPHORUS: Phosphorus: 2.9 mg/dL (ref 2.5–4.6)

## 2015-03-22 LAB — TSH: TSH: 16.539 u[IU]/mL — ABNORMAL HIGH (ref 0.350–4.500)

## 2015-03-22 MED ORDER — MAGNESIUM SULFATE 2 GM/50ML IV SOLN
2.0000 g | Freq: Once | INTRAVENOUS | Status: AC
  Administered 2015-03-22: 2 g via INTRAVENOUS
  Filled 2015-03-22: qty 50

## 2015-03-22 MED ORDER — POTASSIUM CHLORIDE CRYS ER 20 MEQ PO TBCR
40.0000 meq | EXTENDED_RELEASE_TABLET | Freq: Every day | ORAL | Status: DC
  Administered 2015-03-22 – 2015-03-25 (×4): 40 meq via ORAL
  Filled 2015-03-22 (×5): qty 2

## 2015-03-22 MED ORDER — MORPHINE SULFATE 15 MG PO TABS
7.5000 mg | ORAL_TABLET | Freq: Four times a day (QID) | ORAL | Status: DC | PRN
  Administered 2015-03-22 – 2015-03-24 (×2): 7.5 mg via ORAL
  Filled 2015-03-22 (×2): qty 1

## 2015-03-22 MED ORDER — LEVOTHYROXINE SODIUM 175 MCG PO TABS
175.0000 ug | ORAL_TABLET | Freq: Every day | ORAL | Status: DC
  Administered 2015-03-23 – 2015-03-25 (×3): 175 ug via ORAL
  Filled 2015-03-22 (×3): qty 1

## 2015-03-22 MED ORDER — MEGESTROL ACETATE 400 MG/10ML PO SUSP
400.0000 mg | Freq: Every day | ORAL | Status: DC
  Administered 2015-03-22 – 2015-03-25 (×4): 400 mg via ORAL
  Filled 2015-03-22 (×4): qty 10

## 2015-03-22 NOTE — Progress Notes (Signed)
CSW continuing to follow.   Pt plans to discharge to [facility] when medically stable.  Per MD, pt not yet medically ready for discharge.   CSW updated [facility].  CSW to continue to follow.  Alison Murray, MSW, LCSW Clinical Social Work Coverage for [employer], [place]  [phone]

## 2015-03-22 NOTE — Progress Notes (Signed)
Patient had orders to d/c foley catheter.  FC was d/c at 1505. At 1735, patient stated she felt the urge to void and refused the bedpan stating she wanted to get to the [facility] Daughter stated she got OOB to [facility] on Saturday (2/18) and did ok but weak with two people.  Patient was positioned on the edge of the bed dangling when almost immediately she stated she was dizzy and became pale. We got the patient back into the bed and VS were obtained.  BP 128/54, pulse 80.  Patient stated she still felt a little dizzy but was starting to get better.  Patient attempted to void on bedpan without success.  MD was paged.  Will continue to monitor.

## 2015-03-22 NOTE — Progress Notes (Signed)
[facility] Progress Note  Patient: BIANNA KOBASHIGAWA [id number]   PCP: [name], MD DOB: [birth year]   DOA: 03/02/2015   DOS: 03/22/2015   Date of Service: the patient was seen and examined on 03/22/2015  Subjective: Patient had an episode while she was trying to urinate in the bed pan and felt dizzy and almost passed out. Following by that patient did not have any acute complaint of nausea no vomiting or chest pain no dizziness no focal deficit. No events noted on telemetry. Vitals remained stable. Nutrition: Tolerating oral diet Activity: Out of bed to chair with assistance Last BM: 03/19/2015  Assessment and Plan: 1. Sepsis ([place]) Initial admission for sepsis due to healthcare associated pneumonia. As well as ESBL UTI. Patient treated with fosfomycin for ESBL and has been treated with broad-spectrum antibiotics and completed the course of treatment. Currently no fever no leukocytosis.  2. PEA arrest  Patient had new onset seizure which was followed by PEA arrest. Development 03/16/2015. Patient was intubated and was in the ICU care. Extubated on 03/18/2015. Likely secondary to hypoxia as well as seizure. Currently stable. Her CODE STATUS in the interim has been changed from DO NOT RESUSCITATE to partial code as per discussion with the family.  3. New onset seizure. 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 Management per oncology. Seizures are more likely secondary to dural involvement of the malignancy. Currently continuing Keppra. Ativan when necessary with seizure prophylaxis.  4. Acute combined CHF. Elevated troponin. Aspect of ischemic cardiomyopathy with severe MR Multifocal atrial tachycardia Cardiology was consulted. Ejection fraction 30-35% with akinesis of basal mid inferior myocardium. Patient was recommended to be treated conservatively given her metastatic cancer. Currently on aspirin and Plavix. Also on Lipitor as well as carvedilol.  Continue  Lasix While her current event of near syncope did not show any acute arrhythmia on the telemetry with continued to monitor closely  5. Hypothyroidism. Increase Synthroid. TSH is significantly elevated with free T4 near normal range. We will increase the Synthroid to 175 from 150  6. Chronic pain syndrome. On morphine as needed. Dose has been reduced due to patient's lethargy  7. Diabetes mellitus type 2. On sliding scale insulin as well as Levemir. Will continue to monitor.  8. Anemia of chronic disease. H&H stabilizing at present. Although given her weakness as well as shortness of breath and fatigue with CHF I would transfuse her. Maintain H&H of 08. No active bleeding.  DVT Prophylaxis: subcutaneous Heparin Nutrition: Cardiac diet carb modified Advance goals of care discussion: Partial  Consultants: Cardiology, pulmonary, neurology Antibiotics: Anti-infectives    Start     Dose/Rate Route Frequency Ordered Stop   03/17/15 1600  levofloxacin (LEVAQUIN) IVPB 750 mg  Status:  Discontinued     750 mg 100 mL/hr over 90 Minutes Intravenous Every 24 hours 03/16/15 1037 03/16/15 1038   03/17/15 1600  levofloxacin (LEVAQUIN) IVPB 750 mg  Status:  Discontinued     750 mg 100 mL/hr over 90 Minutes Intravenous Every 48 hours 03/16/15 1038 03/16/15 1040   03/15/15 1400  levofloxacin (LEVAQUIN) IVPB 750 mg  Status:  Discontinued     750 mg 100 mL/hr over 90 Minutes Intravenous Every 24 hours 03/15/15 1329 03/16/15 1037   03/13/15 0200  vancomycin (VANCOCIN) 500 mg in sodium chloride 0.9 % 100 mL IVPB  Status:  Discontinued     500 mg 100 mL/hr over 60 Minutes Intravenous Every 12 hours 03/13/15 0045 03/15/15 1221   03/10/15 1400  imipenem-cilastatin (PRIMAXIN) 500 mg in sodium chloride 0.9 % 100 mL IVPB  Status:  Discontinued     500 mg 200 mL/hr over 30 Minutes Intravenous 3 times per day 03/10/15 1152 03/15/15 1221   03/10/15 1200  vancomycin (VANCOCIN) IVPB 750 mg/150 ml premix   Status:  Discontinued     750 mg 150 mL/hr over 60 Minutes Intravenous Every 12 hours 03/10/15 1152 03/13/15 0045   03/09/15 2200  azithromycin (ZITHROMAX) tablet 500 mg  Status:  Discontinued     500 mg Oral Daily at bedtime 03/09/15 0802 03/10/15 1053   03/06/15 2100  azithromycin (ZITHROMAX) 500 mg in dextrose 5 % 250 mL IVPB  Status:  Discontinued     500 mg 250 mL/hr over 60 Minutes Intravenous Every 24 hours 03/06/15 1921 03/09/15 0802   03/06/15 2000  cefTRIAXone (ROCEPHIN) 1 g in dextrose 5 % 50 mL IVPB  Status:  Discontinued     1 g 100 mL/hr over 30 Minutes Intravenous Every 24 hours 03/06/15 1921 03/10/15 1053   03/05/15 1800  imipenem-cilastatin (PRIMAXIN) 250 mg in sodium chloride 0.9 % 100 mL IVPB  Status:  Discontinued     250 mg 200 mL/hr over 30 Minutes Intravenous 4 times per day 03/05/15 1724 03/06/15 1444   03/03/15 1200  vancomycin (VANCOCIN) IVPB 1000 mg/200 mL premix  Status:  Discontinued     1,000 mg 200 mL/hr over 60 Minutes Intravenous Every 24 hours 03/02/15 1215 03/05/15 1643   03/02/15 1800  piperacillin-tazobactam (ZOSYN) IVPB 3.375 g  Status:  Discontinued     3.375 g 12.5 mL/hr over 240 Minutes Intravenous Every 8 hours 03/02/15 1215 03/05/15 1643   03/02/15 1145  piperacillin-tazobactam (ZOSYN) IVPB 3.375 g     3.375 g 100 mL/hr over 30 Minutes Intravenous  Once 03/02/15 1132 03/02/15 1225   03/02/15 1145  vancomycin (VANCOCIN) IVPB 1000 mg/200 mL premix     1,000 mg 200 mL/hr over 60 Minutes Intravenous  Once 03/02/15 1132 03/02/15 1355      Family Communication: family was present at bedside, at the time of interview.  Opportunity was given to ask question and all questions were answered satisfactorily.   Disposition:  Barriers to safe discharge: Improvement in respiratory status   Intake/Output Summary (Last 24 hours) at 03/22/15 1931 Last data filed at 03/22/15 1455  Gross per 24 hour  Intake      0 ml  Output   2700 ml  Net  -2700 ml    Filed Weights   03/20/15 1905 03/21/15 0500 03/22/15 0556  Weight: 77.1 kg (169 lb 15.6 oz) 77.2 kg (170 lb 3.1 oz) 73.1 kg (161 lb 2.5 oz)   Objective: Physical Exam: Filed Vitals:   03/21/15 2037 03/22/15 0556 03/22/15 1455 03/22/15 1740  BP: 140/65 145/82 145/54 128/54  Pulse: 84 96 77 80  Temp: 98 F (36.7 C) 97.6 F (36.4 C) 97.2 F (36.2 C)   TempSrc: Oral Oral Oral   Resp: 22 24 22    Height:      Weight:  73.1 kg (161 lb 2.5 oz)    SpO2: 100% 100% 100%     General: Appear in mild distress, no Rash; Oral Mucosa moist. Cardiovascular: S1 and S2 Present, aortic systolic Murmur, no JVD Respiratory: Bilateral Air entry present and faint basal Crackles, no wheezes Abdomen: Bowel Sound present, Soft and no tenderness Extremities: no Pedal edema, no calf tenderness Neurology: Grossly no focal neuro deficit.  Data Reviewed: CBC:  Recent Labs Lab 03/18/15 0322 03/19/15 0550 03/20/15 0400 03/20/15 1335 03/21/15 0406 03/22/15 0359  WBC 7.7 9.3 5.8 5.8 5.1 5.6  NEUTROABS 6.1 7.4 4.2  --  3.6 3.5  HGB 7.5* 8.1* 7.4* 7.5* 7.4* 8.8*  HCT 25.0* 26.9* 24.0* 24.2* 23.8* 28.4*  MCV 95.4 96.4 96.0 93.8 94.1 92.2  PLT 130* 171 135* 149* 142* [id number]   Basic Metabolic Panel:  Recent Labs Lab 03/17/15 0026 03/17/15 0452  03/18/15 0322 03/19/15 0550 03/20/15 0400 03/21/15 0406 03/22/15 0359 03/22/15 1535  NA 142 144  --  142 143 142 144 143  --   K 3.9 3.9  --  3.8 3.7 3.6 3.4* 3.3*  --   CL 106 108  --  105 106 106 105 103  --   CO2 24 25  --  22 24 27 29 30   --   GLUCOSE 125* 126*  --  85 78 113* 95 120*  --   BUN 19 19  --  22* 19 19 17 17   --   CREATININE 1.05* 1.12*  --  1.00 0.86 0.82 0.80 0.75  --   CALCIUM 7.2* 7.3*  --  7.5* 7.6* 7.4* 7.5* 7.7*  --   MG 1.9 1.9  --  1.8  --   --   --  1.3* 1.8  PHOS  --   --   < > 4.2 3.9 3.3 3.0 2.4* 2.9  < > = values in this interval not displayed. Liver Function Tests:  Recent Labs Lab 03/16/15 0825 03/18/15 0322  03/19/15 0550 03/20/15 0400 03/21/15 0406 03/22/15 0359  AST 58*  --   --   --   --   --   ALT 8*  --   --   --   --   --   ALKPHOS 150*  --   --   --   --   --   BILITOT 0.9  --   --   --   --   --   PROT 5.3*  --   --   --   --   --   ALBUMIN 2.3* 2.4* 2.5* 2.4* 2.4* 2.4*   No results for input(s): LIPASE, AMYLASE in the last 168 hours. No results for input(s): AMMONIA in the last 168 hours.  Cardiac Enzymes:  Recent Labs Lab 03/16/15 0825  TROPONINI 0.12*    BNP (last 3 results)  Recent Labs  08/07/14 1158 08/10/14 1715 08/24/14 1042  BNP 343.0* 354.4* 478.7*    CBG:  Recent Labs Lab 03/21/15 1755 03/21/15 2103 03/22/15 0759 03/22/15 1226 03/22/15 1634  GLUCAP 145* 200* 122* 136* 136*    Recent Results (from the past 240 hour(s))  Culture, blood (Routine X 2) w Reflex to ID Panel     Status: None   Collection Time: 03/16/15  8:25 AM  Result Value Ref Range Status   Specimen Description BLOOD RIGHT WRIST  Final   Special Requests BOTTLES DRAWN AEROBIC AND ANAEROBIC 5CC  Final   Culture   Final    NO GROWTH 5 DAYS Performed at [facility]    Report Status 03/21/2015 FINAL  Final  Culture, blood (routine x 2)     Status: None   Collection Time: 03/16/15 10:34 AM  Result Value Ref Range Status   Specimen Description BLOOD LEFT HAND  Final   Special Requests IN PEDIATRIC BOTTLE [facility]  Final   Culture   Final    NO  GROWTH 5 DAYS Performed at [facility]    Report Status 03/21/2015 FINAL  Final  Culture, blood (Routine X 2) w Reflex to ID Panel     Status: None   Collection Time: 03/16/15 10:35 AM  Result Value Ref Range Status   Specimen Description BLOOD LEFT ARM  Final   Special Requests IN PEDIATRIC BOTTLE 3CC  Final   Culture   Final    NO GROWTH 5 DAYS Performed at [facility]    Report Status 03/21/2015 FINAL  Final  Urine culture     Status: None   Collection Time: 03/16/15  1:14 PM  Result Value Ref Range Status    Specimen Description URINE, CATHETERIZED  Final   Special Requests NONE  Final   Culture   Final    NO GROWTH 1 DAY Performed at [facility]    Report Status 03/17/2015 FINAL  Final     Studies: No results found.   Scheduled Meds: . aspirin  81 mg Oral Daily  . atorvastatin  80 mg Oral Daily  . carvedilol  15.625 mg Oral BID WC  . clopidogrel  75 mg Oral Daily  . feeding supplement (ENSURE ENLIVE)  237 mL Oral BID BM  . furosemide  40 mg Oral Daily  . heparin subcutaneous  5,000 Units Subcutaneous 3 times per day  . insulin aspart  0-5 Units Subcutaneous QHS  . insulin aspart  0-9 Units Subcutaneous TID WC  . levETIRAcetam  1,000 mg Oral BID  . [START ON 03/23/2015] levothyroxine  175 mcg Oral QAC breakfast  . megestrol  400 mg Oral Daily  . mirtazapine  7.5 mg Oral QHS  . pantoprazole  40 mg Oral Daily  . potassium chloride  40 mEq Oral Daily  . sodium chloride flush  10-40 mL Intracatheter Q12H  . tamsulosin  0.4 mg Oral Daily   Continuous Infusions: . sodium chloride 10 mL/hr at 03/20/15 1500   PRN Meds: acetaminophen **OR** acetaminophen, artificial tears, ipratropium-albuterol, morphine, ondansetron **OR** ondansetron (ZOFRAN) IV, sodium chloride flush  Time spent: 30 minutes  Author: Berle Mull, MD [facility] Pager: [phone] 03/22/2015 7:31 PM  If 7PM-7AM, please contact night-coverage at [email or web address], password [facility]

## 2015-03-23 LAB — RENAL FUNCTION PANEL
ALBUMIN: 2.5 g/dL — AB (ref 3.5–5.0)
Anion gap: 12 (ref 5–15)
BUN: 16 mg/dL (ref 6–20)
CO2: 30 mmol/L (ref 22–32)
CREATININE: 0.74 mg/dL (ref 0.44–1.00)
Calcium: 8.1 mg/dL — ABNORMAL LOW (ref 8.9–10.3)
Chloride: 100 mmol/L — ABNORMAL LOW (ref 101–111)
Glucose, Bld: 124 mg/dL — ABNORMAL HIGH (ref 65–99)
PHOSPHORUS: 2.5 mg/dL (ref 2.5–4.6)
Potassium: 3.5 mmol/L (ref 3.5–5.1)
Sodium: 142 mmol/L (ref 135–145)

## 2015-03-23 LAB — CBC WITH DIFFERENTIAL/PLATELET
BASOS ABS: 0 10*3/uL (ref 0.0–0.1)
BASOS PCT: 1 %
EOS ABS: 0.2 10*3/uL (ref 0.0–0.7)
EOS PCT: 4 %
HCT: 28.7 % — ABNORMAL LOW (ref 36.0–46.0)
Hemoglobin: 9.1 g/dL — ABNORMAL LOW (ref 12.0–15.0)
Lymphocytes Relative: 18 %
Lymphs Abs: 1.2 10*3/uL (ref 0.7–4.0)
MCH: 28.6 pg (ref 26.0–34.0)
MCHC: 31.7 g/dL (ref 30.0–36.0)
MCV: 90.3 fL (ref 78.0–100.0)
Monocytes Absolute: 0.6 10*3/uL (ref 0.1–1.0)
Monocytes Relative: 9 %
Neutro Abs: 4.5 10*3/uL (ref 1.7–7.7)
Neutrophils Relative %: 68 %
PLATELETS: 143 10*3/uL — AB (ref 150–400)
RBC: 3.18 MIL/uL — AB (ref 3.87–5.11)
RDW: 18.2 % — ABNORMAL HIGH (ref 11.5–15.5)
WBC: 6.4 10*3/uL (ref 4.0–10.5)

## 2015-03-23 LAB — GLUCOSE, CAPILLARY
GLUCOSE-CAPILLARY: 117 mg/dL — AB (ref 65–99)
GLUCOSE-CAPILLARY: 178 mg/dL — AB (ref 65–99)
Glucose-Capillary: 117 mg/dL — ABNORMAL HIGH (ref 65–99)
Glucose-Capillary: 138 mg/dL — ABNORMAL HIGH (ref 65–99)

## 2015-03-23 LAB — MAGNESIUM: MAGNESIUM: 1.5 mg/dL — AB (ref 1.7–2.4)

## 2015-03-23 MED ORDER — POTASSIUM CHLORIDE CRYS ER 20 MEQ PO TBCR: 40.0000 meq | EXTENDED_RELEASE_TABLET | Freq: Once | ORAL | Status: DC

## 2015-03-23 MED ORDER — MAGNESIUM SULFATE 2 GM/50ML IV SOLN
2.0000 g | Freq: Once | INTRAVENOUS | Status: AC
  Administered 2015-03-23: 2 g via INTRAVENOUS
  Filled 2015-03-23: qty 50

## 2015-03-23 NOTE — Care Management Important Message (Signed)
Important Message  Patient Details  Name: OYINKANSOLA DRONET MRN: [id number] Date of Birth: 11/18/1940   Medicare Important Message Given:  Yes    Camillo Flaming 03/23/2015, 1:08 PMImportant Message  Patient Details  Name: SHAKESHIA CHERRY MRN: [id number] Date of Birth: Aug 02, 1940   Medicare Important Message Given:  Yes    Camillo Flaming 03/23/2015, 1:08 PM

## 2015-03-23 NOTE — Progress Notes (Signed)
[facility] Progress Note  Patient: Brittney Cunningham [id number]   PCP: [name], MD DOB: Jan 22, 1941   DOA: 03/02/2015   DOS: 03/23/2015   Date of Service: the patient was seen and examined on 03/23/2015  Subjective: Patient continues to mention that she does not have any acute complaint but has profound weakness. No nausea no vomiting. Minimal oral intake. No diarrhea.. Nutrition: Tolerating oral diet Activity: Mostly bedridden at present due to profound weakness Last BM: 03/19/2015  Assessment and Plan: 1. Sepsis ([place]) Initial admission for sepsis due to healthcare associated pneumonia. As well as ESBL UTI. Patient treated with fosfomycin for ESBL and has been treated with broad-spectrum antibiotics and completed the course of treatment. Currently no fever no leukocytosis.  2. PEA arrest  Patient had new onset seizure which was followed by PEA arrest. Development 03/16/2015. Patient was intubated and was in the ICU care. Extubated on 03/18/2015. Likely secondary to hypoxia as well as seizure. Currently stable. Her CODE STATUS in the interim has been changed from DO NOT RESUSCITATE to partial code as per discussion with the family.  3. New onset seizure. 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 Management per oncology. Seizures are more likely secondary to dural involvement of the malignancy. Currently continuing Keppra. Ativan when necessary with seizure prophylaxis.  4. Acute combined CHF. Elevated troponin. Aspect of ischemic cardiomyopathy with severe MR Multifocal atrial tachycardia Cardiology was consulted. Ejection fraction 30-35% with akinesis of basal mid inferior myocardium. Patient was recommended to be treated conservatively given her metastatic cancer. Currently on aspirin and Plavix. Also on Lipitor as well as carvedilol.  Continue Lasix Patient had an event of near syncope on 03/22/2015 no acute event noted on telemetry.  5. Hypothyroidism. Increase  Synthroid. TSH is significantly elevated with free T4 near normal range. We will increase the Synthroid to 175 from 150  6. Chronic pain syndrome. On morphine as needed. Dose has been reduced due to patient's lethargy  7. Diabetes mellitus type 2. On sliding scale insulin as well as Levemir. Will continue to monitor.  8. Anemia of chronic disease. H&H stabilizing at present. Although given her weakness as well as shortness of breath and fatigue with CHF I would transfuse her. Maintain H&H of 08. No active bleeding.  9. Profound generalized weakness. Patient has significant weakness primarily in her lower extremity. I discussed with neurology and they do not think that this is due to [place]. Magnesium level phosphorous level TSH level has been checked. keppra  level is currently pending. Neurology recommends to check MRI of her thoracic and lumbar spine to rule out any metastasis there.  DVT Prophylaxis: subcutaneous Heparin Nutrition: Cardiac diet carb modified Advance goals of care discussion: Partial  Consultants: Cardiology, pulmonary, neurology Antibiotics: Anti-infectives    Start     Dose/Rate Route Frequency Ordered Stop   03/17/15 1600  levofloxacin (LEVAQUIN) IVPB 750 mg  Status:  Discontinued     750 mg 100 mL/hr over 90 Minutes Intravenous Every 24 hours 03/16/15 1037 03/16/15 1038   03/17/15 1600  levofloxacin (LEVAQUIN) IVPB 750 mg  Status:  Discontinued     750 mg 100 mL/hr over 90 Minutes Intravenous Every 48 hours 03/16/15 1038 03/16/15 1040   03/15/15 1400  levofloxacin (LEVAQUIN) IVPB 750 mg  Status:  Discontinued     750 mg 100 mL/hr over 90 Minutes Intravenous Every 24 hours 03/15/15 1329 03/16/15 1037   03/13/15 0200  vancomycin (VANCOCIN) 500 mg in sodium chloride 0.9 % 100  mL IVPB  Status:  Discontinued     500 mg 100 mL/hr over 60 Minutes Intravenous Every 12 hours 03/13/15 0045 03/15/15 1221   03/10/15 1400  imipenem-cilastatin (PRIMAXIN) 500 mg in  sodium chloride 0.9 % 100 mL IVPB  Status:  Discontinued     500 mg 200 mL/hr over 30 Minutes Intravenous 3 times per day 03/10/15 1152 03/15/15 1221   03/10/15 1200  vancomycin (VANCOCIN) IVPB 750 mg/150 ml premix  Status:  Discontinued     750 mg 150 mL/hr over 60 Minutes Intravenous Every 12 hours 03/10/15 1152 03/13/15 0045   03/09/15 2200  azithromycin (ZITHROMAX) tablet 500 mg  Status:  Discontinued     500 mg Oral Daily at bedtime 03/09/15 0802 03/10/15 1053   03/06/15 2100  azithromycin (ZITHROMAX) 500 mg in dextrose 5 % 250 mL IVPB  Status:  Discontinued     500 mg 250 mL/hr over 60 Minutes Intravenous Every 24 hours 03/06/15 1921 03/09/15 0802   03/06/15 2000  cefTRIAXone (ROCEPHIN) 1 g in dextrose 5 % 50 mL IVPB  Status:  Discontinued     1 g 100 mL/hr over 30 Minutes Intravenous Every 24 hours 03/06/15 1921 03/10/15 1053   03/05/15 1800  imipenem-cilastatin (PRIMAXIN) 250 mg in sodium chloride 0.9 % 100 mL IVPB  Status:  Discontinued     250 mg 200 mL/hr over 30 Minutes Intravenous 4 times per day 03/05/15 1724 03/06/15 1444   03/03/15 1200  vancomycin (VANCOCIN) IVPB 1000 mg/200 mL premix  Status:  Discontinued     1,000 mg 200 mL/hr over 60 Minutes Intravenous Every 24 hours 03/02/15 1215 03/05/15 1643   03/02/15 1800  piperacillin-tazobactam (ZOSYN) IVPB 3.375 g  Status:  Discontinued     3.375 g 12.5 mL/hr over 240 Minutes Intravenous Every 8 hours 03/02/15 1215 03/05/15 1643   03/02/15 1145  piperacillin-tazobactam (ZOSYN) IVPB 3.375 g     3.375 g 100 mL/hr over 30 Minutes Intravenous  Once 03/02/15 1132 03/02/15 1225   03/02/15 1145  vancomycin (VANCOCIN) IVPB 1000 mg/200 mL premix     1,000 mg 200 mL/hr over 60 Minutes Intravenous  Once 03/02/15 1132 03/02/15 1355      Family Communication: family was present at bedside, at the time of interview.  Opportunity was given to ask question and all questions were answered satisfactorily.   Disposition:  Barriers to  safe discharge: Improvement in respiratory status   Intake/Output Summary (Last 24 hours) at 03/23/15 1953 Last data filed at 03/23/15 1300  Gross per 24 hour  Intake    360 ml  Output    800 ml  Net   -440 ml   Filed Weights   03/21/15 0500 03/22/15 0556 03/23/15 0558  Weight: 77.2 kg (170 lb 3.1 oz) 73.1 kg (161 lb 2.5 oz) 76 kg (167 lb 8.8 oz)   Objective: Physical Exam: Filed Vitals:   03/22/15 1740 03/22/15 2208 03/23/15 0558 03/23/15 1500  BP: 128/54 137/64 153/81 135/54  Pulse: 80 74 91 72  Temp:  97.5 F (36.4 C) 97.9 F (36.6 C) 97.9 F (36.6 C)  TempSrc:  Oral Oral Oral  Resp:  22 22 20   Height:      Weight:   76 kg (167 lb 8.8 oz)   SpO2:  99% 94% 100%    General: Appear in mild distress, no Rash; Oral Mucosa moist. Cardiovascular: S1 and S2 Present, aortic systolic Murmur, no JVD Respiratory: Bilateral Air entry present and faint  basal Crackles, no wheezes Abdomen: Bowel Sound present, Soft and no tenderness Extremities: no Pedal edema, no calf tenderness Neurology: Grossly no focal neuro deficit.  Data Reviewed: CBC:  Recent Labs Lab 03/19/15 0550 03/20/15 0400 03/20/15 1335 03/21/15 0406 03/22/15 0359 03/23/15 0508  WBC 9.3 5.8 5.8 5.1 5.6 6.4  NEUTROABS 7.4 4.2  --  3.6 3.5 4.5  HGB 8.1* 7.4* 7.5* 7.4* 8.8* 9.1*  HCT 26.9* 24.0* 24.2* 23.8* 28.4* 28.7*  MCV 96.4 96.0 93.8 94.1 92.2 90.3  PLT 171 135* 149* 142* 157 [id number]*   Basic Metabolic Panel:  Recent Labs Lab 03/17/15 0452  03/18/15 0322 03/19/15 0550 03/20/15 0400 03/21/15 0406 03/22/15 0359 03/22/15 1535 03/23/15 0508  NA 144  --  142 143 142 144 143  --  142  K 3.9  --  3.8 3.7 3.6 3.4* 3.3*  --  3.5  CL 108  --  105 106 106 105 103  --  100*  CO2 25  --  22 24 27 29 30   --  30  GLUCOSE 126*  --  85 78 113* 95 120*  --  124*  BUN 19  --  22* 19 19 17 17   --  16  CREATININE 1.12*  --  1.00 0.86 0.82 0.80 0.75  --  0.74  CALCIUM 7.3*  --  7.5* 7.6* 7.4* 7.5* 7.7*  --  8.1*    MG 1.9  --  1.8  --   --   --  1.3* 1.8 1.5*  PHOS  --   < > 4.2 3.9 3.3 3.0 2.4* 2.9 2.5  < > = values in this interval not displayed. Liver Function Tests:  Recent Labs Lab 03/19/15 0550 03/20/15 0400 03/21/15 0406 03/22/15 0359 03/23/15 0508  ALBUMIN 2.5* 2.4* 2.4* 2.4* 2.5*   No results for input(s): LIPASE, AMYLASE in the last 168 hours. No results for input(s): AMMONIA in the last 168 hours.  Cardiac Enzymes: No results for input(s): CKTOTAL, CKMB, CKMBINDEX, TROPONINI in the last 168 hours.  BNP (last 3 results)  Recent Labs  08/07/14 1158 08/10/14 1715 08/24/14 1042  BNP 343.0* 354.4* 478.7*    CBG:  Recent Labs Lab 03/22/15 1634 03/22/15 2207 03/23/15 0743 03/23/15 1135 03/23/15 1656  GLUCAP 136* 158* 117* 178* 117*    Recent Results (from the past 240 hour(s))  Culture, blood (Routine X 2) w Reflex to ID Panel     Status: None   Collection Time: 03/16/15  8:25 AM  Result Value Ref Range Status   Specimen Description BLOOD RIGHT WRIST  Final   Special Requests BOTTLES DRAWN AEROBIC AND ANAEROBIC 5CC  Final   Culture   Final    NO GROWTH 5 DAYS Performed at [facility]    Report Status 03/21/2015 FINAL  Final  Culture, blood (routine x 2)     Status: None   Collection Time: 03/16/15 10:34 AM  Result Value Ref Range Status   Specimen Description BLOOD LEFT HAND  Final   Special Requests IN PEDIATRIC BOTTLE 3CC  Final   Culture   Final    NO GROWTH 5 DAYS Performed at [facility]    Report Status 03/21/2015 FINAL  Final  Culture, blood (Routine X 2) w Reflex to ID Panel     Status: None   Collection Time: 03/16/15 10:35 AM  Result Value Ref Range Status   Specimen Description BLOOD LEFT ARM  Final   Special Requests IN PEDIATRIC  BOTTLE 3CC  Final   Culture   Final    NO GROWTH 5 DAYS Performed at [facility]    Report Status 03/21/2015 FINAL  Final  Urine culture     Status: None   Collection Time: 03/16/15   1:14 PM  Result Value Ref Range Status   Specimen Description URINE, CATHETERIZED  Final   Special Requests NONE  Final   Culture   Final    NO GROWTH 1 DAY Performed at [facility]    Report Status 03/17/2015 FINAL  Final     Studies: No results found.   Scheduled Meds: . aspirin  81 mg Oral Daily  . atorvastatin  80 mg Oral Daily  . carvedilol  15.625 mg Oral BID WC  . clopidogrel  75 mg Oral Daily  . feeding supplement (ENSURE ENLIVE)  237 mL Oral BID BM  . furosemide  40 mg Oral Daily  . heparin subcutaneous  5,000 Units Subcutaneous 3 times per day  . insulin aspart  0-5 Units Subcutaneous QHS  . insulin aspart  0-9 Units Subcutaneous TID WC  . levETIRAcetam  1,000 mg Oral BID  . levothyroxine  175 mcg Oral QAC breakfast  . megestrol  400 mg Oral Daily  . pantoprazole  40 mg Oral Daily  . potassium chloride  40 mEq Oral Daily  . sodium chloride flush  10-40 mL Intracatheter Q12H   Continuous Infusions: . sodium chloride 10 mL/hr at 03/20/15 1500   PRN Meds: acetaminophen **OR** acetaminophen, artificial tears, ipratropium-albuterol, morphine, ondansetron **OR** ondansetron (ZOFRAN) IV, sodium chloride flush  Time spent: 30 minutes  Author: Berle Mull, MD [facility] Pager: [phone] 03/23/2015 7:53 PM  If 7PM-7AM, please contact night-coverage at [email or web address], password [facility]

## 2015-03-23 NOTE — Evaluation (Signed)
Physical Therapy RE-Evaluation Patient Details Name: Brittney Cunningham MRN: [id number] DOB: 09-Jul-1940 Today's Date: 03/23/2015   History of Present Illness  75 yo female admitted with onset of nasal congestion, rhinorrhea, cough, and worsening fatigue. Dx of HCAP, UTI, sepsis.  PMH of stage IV right breast cancer diagnosed May/2011 with metastasis to the bone and liver status post neoadjuvant therapy, right modified radical mastectomy 03/2010 and radiation treatment .  PEA arrest 03/16/15 and extubated 03/18/15  Clinical Impression  Pt admitted with above diagnosis. Pt currently with functional limitations due to the deficits listed below (see PT Problem List). Pt last seen by PT on 03/08/15 and not medically stable thereafter to participate.  New order placed so PT re-evaluated and pt presents with increased weakness and decreased endurance.  Pt unable to tolerate mobility at this time and had no active LE movement against gravity.  Pt may not be able to tolerate rehab at SNF due to weakness and deconditioning however will need increased assist upon d/c. Pt will benefit from skilled PT to increase their independence and safety with mobility to allow discharge to the venue listed below.   Pt reported pain to her sacral area today so repositioned off sacrum at that time and heels floated.  Please reposition pt every 2-4 hours for pressure relief as she does not have the strength to self assist.     Follow Up Recommendations SNF;Supervision/Assistance - 24 hour    Equipment Recommendations  Other (comment) (if home, w/c (if does not already have) and hoyer lift)    Recommendations for Other Services       Precautions / Restrictions Precautions Precautions: Fall Precaution Comments: seizure      Mobility  Bed Mobility Overal bed mobility: +2 for physical assistance;Needs Assistance Bed Mobility: Rolling Rolling: +2 for physical assistance;Total assist         General bed mobility comments:  assist for rolling to reposition, educated pt to remain off sacrum and elevate heels (RN in room and assisted), pt too weak to perform sitting EOB  Transfers                    Ambulation/Gait                Stairs            Wheelchair Mobility    Modified Rankin (Stroke Patients Only)       Balance                                             Pertinent Vitals/Pain Pain Assessment: Faces Faces Pain Scale: Hurts little more Pain Location: "butt" Pain Descriptors / Indicators: Sore Pain Intervention(s): Limited activity within patient's tolerance;Monitored during session;Repositioned (had RN assist with positioning off sacrum)    Home Living Family/patient expects to be discharged to:: Private residence Living Arrangements: Spouse/significant other Available Help at Discharge: Family;Available PRN/intermittently (son and daughter available after work, husband not able to assist) Type of Home: House Home Access: Ramped entrance     [place]: Able to live on main level with bedroom/bathroom;Laundry or work area in [place]: Environmental consultant - 2 wheels;Cane - single point;Shower seat;Hand held shower head;Bedside commode Additional Comments: plans for d/c to SNF    Prior Function Level of Independence: Independent with assistive device(s)         Comments:  used RW for ambulation just prior to admission     Hand Dominance        Extremity/Trunk Assessment   Upper Extremity Assessment: Generalized weakness (Bilaterally: unable to actively move shoulders, able to perform elbow to mouth, very poor grip strength)           Lower Extremity Assessment: Generalized weakness;RLE deficits/detail;LLE deficits/detail RLE Deficits / Details: grossly at best 2/5 throughout, unable to move against gravity LLE Deficits / Details: grossly at best 2/5 throughout, unable to move against gravity     Communication    Communication: No difficulties  Cognition Arousal/Alertness: Lethargic Behavior During Therapy: WFL for tasks assessed/performed Overall Cognitive Status: No family/caregiver present to determine baseline cognitive functioning (very sleepy today however appropriately answers questions)                      General Comments      Exercises        Assessment/Plan    PT Assessment Patient needs continued PT services  PT Diagnosis Generalized weakness;Difficulty walking   PT Problem List Decreased strength;Decreased activity tolerance;Decreased balance;Decreased mobility  PT Treatment Interventions Functional mobility training;Therapeutic activities;Patient/family education;Balance training;Therapeutic exercise;DME instruction;Wheelchair mobility training   PT Goals (Current goals can be found in the Care Plan section) Acute Rehab PT Goals PT Goal Formulation: With patient Time For Goal Achievement: 04/06/15 Potential to Achieve Goals: Poor    Frequency Min 2X/week   Barriers to discharge        Co-evaluation               End of Session   Activity Tolerance: Patient limited by fatigue Patient left: in bed;with call bell/phone within reach;with bed alarm set           Time: [id number] PT Time Calculation (min) (ACUTE ONLY): 18 min   Charges:   PT Evaluation $PT Re-evaluation: 1 Procedure     PT G Codes:        Tymon Nemetz,KATHrine E 03/23/2015, 2:00 PM Carmelia Bake, PT, DPT 03/23/2015 Pager: [phone]

## 2015-03-23 NOTE — Progress Notes (Signed)
Nutrition Follow-up  DOCUMENTATION CODES:   Not applicable  INTERVENTION:  - Continue Ensure Enlive BID - Continue to encourage PO intakes of meals - RD will continue to monitor for needs  NUTRITION DIAGNOSIS:   Inadequate oral intake related to poor appetite as evidenced by per patient/family report. -improving  GOAL:   Patient will meet greater than or equal to 90% of their needs -unmet on average  MONITOR:   PO intake, Supplement acceptance, Weight trends, Labs, Skin, I & O's  ASSESSMENT:   Brittney Cunningham is an 75 y.o. 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 No intakes documented since previous assessment. Pt reports that today she has had cranberry juice, yogurt, and ice cream. She states she tolerated these items well and did not have any associated nausea or abdominal pain, no problems swallowing. She feels she will be able to eat lunch and dinner today. Pt states she has been drinking Ensure, usually BID.  Not meeting needs. Continue to encourage intakes at meals. Medications reviewed. Labs reviewed; CBGs: 91-200 mg/dL, Cl: 100 mmol/L, Ca: 8.1 mg/dL, Mg: 1.5 mg/dL.    2/15 - Patient is currently intubated on ventilator support - MV: 6.67min; Propofol: None - Pt was intubated yesterday after PEA arrest.  - She had a seizure during MRI.  - Code status has become a DNR at this point.  - Per CCM note, pt will undergo SBT today.  Diet Order:  Diet regular Room service appropriate?: Yes; Fluid consistency:: Thin  Skin:  Wound (see comment) (Stage 2 sacral pressure ulcer)  Last BM:  2/17  Height:   Ht Readings from Last 1 Encounters:  03/20/15 5\' 3"  (1.6 m)    Weight:   Wt Readings from Last 1 Encounters:   03/23/15 167 lb 8.8 oz (76 kg)    Ideal Body Weight:  52.27 kg  BMI:  Body mass index is 29.69 kg/(m^2).  Estimated Nutritional Needs:   Kcal:  2200-2400  Protein:  90-105 grams  Fluid:  1.8-2 L/day  EDUCATION NEEDS:   No education needs identified at this time     Jarome Matin, RD, LDN Inpatient Clinical Dietitian Pager # [phone] After hours/weekend pager # [phone]

## 2015-03-24 ENCOUNTER — Inpatient Hospital Stay (HOSPITAL_COMMUNITY): Payer: Medicare Other

## 2015-03-24 LAB — GLUCOSE, CAPILLARY
GLUCOSE-CAPILLARY: 110 mg/dL — AB (ref 65–99)
GLUCOSE-CAPILLARY: 175 mg/dL — AB (ref 65–99)
Glucose-Capillary: 111 mg/dL — ABNORMAL HIGH (ref 65–99)
Glucose-Capillary: 156 mg/dL — ABNORMAL HIGH (ref 65–99)

## 2015-03-24 LAB — CBC WITH DIFFERENTIAL/PLATELET
BASOS PCT: 0 %
Basophils Absolute: 0 10*3/uL (ref 0.0–0.1)
EOS ABS: 0.3 10*3/uL (ref 0.0–0.7)
Eosinophils Relative: 3 %
HEMATOCRIT: 31.8 % — AB (ref 36.0–46.0)
HEMOGLOBIN: 9.8 g/dL — AB (ref 12.0–15.0)
LYMPHS ABS: 1.4 10*3/uL (ref 0.7–4.0)
Lymphocytes Relative: 18 %
MCH: 28.1 pg (ref 26.0–34.0)
MCHC: 30.8 g/dL (ref 30.0–36.0)
MCV: 91.1 fL (ref 78.0–100.0)
MONO ABS: 0.7 10*3/uL (ref 0.1–1.0)
MONOS PCT: 9 %
NEUTROS ABS: 5.3 10*3/uL (ref 1.7–7.7)
Neutrophils Relative %: 70 %
Platelets: 172 10*3/uL (ref 150–400)
RBC: 3.49 MIL/uL — ABNORMAL LOW (ref 3.87–5.11)
RDW: 18.6 % — AB (ref 11.5–15.5)
WBC: 7.6 10*3/uL (ref 4.0–10.5)

## 2015-03-24 LAB — RENAL FUNCTION PANEL
Albumin: 2.6 g/dL — ABNORMAL LOW (ref 3.5–5.0)
Anion gap: 11 (ref 5–15)
BUN: 16 mg/dL (ref 6–20)
CALCIUM: 8.6 mg/dL — AB (ref 8.9–10.3)
CHLORIDE: 103 mmol/L (ref 101–111)
CO2: 28 mmol/L (ref 22–32)
CREATININE: 0.71 mg/dL (ref 0.44–1.00)
GFR calc Af Amer: >60 mL/min (ref >60)
GFR calc non Af Amer: >60 mL/min (ref >60)
GLUCOSE: 118 mg/dL — AB (ref 65–99)
Phosphorus: 2.9 mg/dL (ref 2.5–4.6)
Potassium: 4 mmol/L (ref 3.5–5.1)
SODIUM: 142 mmol/L (ref 135–145)

## 2015-03-24 MED ORDER — MAGNESIUM SULFATE 2 GM/50ML IV SOLN
2.0000 g | Freq: Once | INTRAVENOUS | Status: AC
  Administered 2015-03-24: 2 g via INTRAVENOUS
  Filled 2015-03-24: qty 50

## 2015-03-24 MED ORDER — GADOBENATE DIMEGLUMINE 529 MG/ML IV SOLN
15.0000 mL | Freq: Once | INTRAVENOUS | Status: AC | PRN
  Administered 2015-03-24: 15 mL via INTRAVENOUS

## 2015-03-24 NOTE — Progress Notes (Signed)
[facility] Progress Note  Patient: Brittney Cunningham [id number]   PCP: [name], MD DOB: 12-18-1940   DOA: 03/02/2015   DOS: 03/24/2015   Date of Service: the patient was seen and examined on 03/24/2015  Subjective:  Eating very little per family Patient very weak-- not wanting to talk  Assessment and Plan: 1. Sepsis ([place]) Initial admission for sepsis due to healthcare associated pneumonia. As well as ESBL UTI. Patient treated with fosfomycin for ESBL and has been treated with broad-spectrum antibiotics and completed the course of treatment. Currently no fever no leukocytosis.  2. PEA arrest  Patient had new onset seizure which was followed by PEA arrest. Development 03/16/2015. Patient was intubated and was in the ICU care. Extubated on 03/18/2015. Likely secondary to hypoxia as well as seizure. Currently stable. Her CODE STATUS in the interim has been changed to DO NOT RESUSCITATE   3. New onset seizure. 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 Management per oncology. Seizures are more likely secondary to dural involvement of the malignancy. Currently continuing Keppra. Ativan when necessary with seizure prophylaxis.  4. Acute combined CHF. Elevated troponin. Aspect of ischemic cardiomyopathy with severe MR Multifocal atrial tachycardia Cardiology was consulted. Ejection fraction 30-35% with akinesis of basal mid inferior myocardium. Patient was recommended to be treated conservatively given her metastatic cancer. Currently on aspirin and Plavix. Also on Lipitor as well as carvedilol.  Continue Lasix Patient had an event of near syncope on 03/22/2015 no acute event noted on telemetry.  5. Hypothyroidism. Increase Synthroid. TSH is significantly elevated with free T4 near normal range. We will increase the Synthroid to 175 from 150  6. Chronic pain syndrome. On morphine as needed. Dose has been reduced due to patient's lethargy  7. Diabetes mellitus type  2. On sliding scale insulin as well as Levemir. Will continue to monitor.  8. Anemia of chronic disease. H&H stabilizing at present. Although given her weakness as well as shortness of breath and fatigue with CHF I would transfuse her. Maintain H&H of 08. No active bleeding.  9. Profound generalized weakness. Patient has significant weakness primarily in her lower extremity. I discussed with neurology and they do not think that this is due to [place]. keppra  level is currently pending. MRI shows: Diffuse thoracic and lumbar metastasis without epidural infiltration or cord compromise  DVT Prophylaxis: subcutaneous Heparin Nutrition: Cardiac diet carb modified Advance goals of care discussion: Partial  Consultants: Cardiology, pulmonary, neurology Antibiotics: Anti-infectives    Start     Dose/Rate Route Frequency Ordered Stop   03/17/15 1600  levofloxacin (LEVAQUIN) IVPB 750 mg  Status:  Discontinued     750 mg 100 mL/hr over 90 Minutes Intravenous Every 24 hours 03/16/15 1037 03/16/15 1038   03/17/15 1600  levofloxacin (LEVAQUIN) IVPB 750 mg  Status:  Discontinued     750 mg 100 mL/hr over 90 Minutes Intravenous Every 48 hours 03/16/15 1038 03/16/15 1040   03/15/15 1400  levofloxacin (LEVAQUIN) IVPB 750 mg  Status:  Discontinued     750 mg 100 mL/hr over 90 Minutes Intravenous Every 24 hours 03/15/15 1329 03/16/15 1037   03/13/15 0200  vancomycin (VANCOCIN) 500 mg in sodium chloride 0.9 % 100 mL IVPB  Status:  Discontinued     500 mg 100 mL/hr over 60 Minutes Intravenous Every 12 hours 03/13/15 0045 03/15/15 1221   03/10/15 1400  imipenem-cilastatin (PRIMAXIN) 500 mg in sodium chloride 0.9 % 100 mL IVPB  Status:  Discontinued  500 mg 200 mL/hr over 30 Minutes Intravenous 3 times per day 03/10/15 1152 03/15/15 1221   03/10/15 1200  vancomycin (VANCOCIN) IVPB 750 mg/150 ml premix  Status:  Discontinued     750 mg 150 mL/hr over 60 Minutes Intravenous Every 12 hours 03/10/15  1152 03/13/15 0045   03/09/15 2200  azithromycin (ZITHROMAX) tablet 500 mg  Status:  Discontinued     500 mg Oral Daily at bedtime 03/09/15 0802 03/10/15 1053   03/06/15 2100  azithromycin (ZITHROMAX) 500 mg in dextrose 5 % 250 mL IVPB  Status:  Discontinued     500 mg 250 mL/hr over 60 Minutes Intravenous Every 24 hours 03/06/15 1921 03/09/15 0802   03/06/15 2000  cefTRIAXone (ROCEPHIN) 1 g in dextrose 5 % 50 mL IVPB  Status:  Discontinued     1 g 100 mL/hr over 30 Minutes Intravenous Every 24 hours 03/06/15 1921 03/10/15 1053   03/05/15 1800  imipenem-cilastatin (PRIMAXIN) 250 mg in sodium chloride 0.9 % 100 mL IVPB  Status:  Discontinued     250 mg 200 mL/hr over 30 Minutes Intravenous 4 times per day 03/05/15 1724 03/06/15 1444   03/03/15 1200  vancomycin (VANCOCIN) IVPB 1000 mg/200 mL premix  Status:  Discontinued     1,000 mg 200 mL/hr over 60 Minutes Intravenous Every 24 hours 03/02/15 1215 03/05/15 1643   03/02/15 1800  piperacillin-tazobactam (ZOSYN) IVPB 3.375 g  Status:  Discontinued     3.375 g 12.5 mL/hr over 240 Minutes Intravenous Every 8 hours 03/02/15 1215 03/05/15 1643   03/02/15 1145  piperacillin-tazobactam (ZOSYN) IVPB 3.375 g     3.375 g 100 mL/hr over 30 Minutes Intravenous  Once 03/02/15 1132 03/02/15 1225   03/02/15 1145  vancomycin (VANCOCIN) IVPB 1000 mg/200 mL premix     1,000 mg 200 mL/hr over 60 Minutes Intravenous  Once 03/02/15 1132 03/02/15 1355      Family Communication: discussed with daughter that patient may not do well in rehab and at that time, will need to consider hospice-- will get palliative care to follow at SNF    Intake/Output Summary (Last 24 hours) at 03/24/15 1610 Last data filed at 03/23/15 2304  Gross per 24 hour  Intake      0 ml  Output      0 ml  Net      0 ml   Filed Weights   03/22/15 0556 03/23/15 0558 03/24/15 0529  Weight: 73.1 kg (161 lb 2.5 oz) 76 kg (167 lb 8.8 oz) 74.6 kg (164 lb 7.4 oz)   Objective: Physical  Exam: Filed Vitals:   03/23/15 1500 03/23/15 2249 03/24/15 0529 03/24/15 1559  BP: 135/54 151/97 147/99 143/57  Pulse: 72 85 82 83  Temp: 97.9 F (36.6 C) 97.5 F (36.4 C) 97.7 F (36.5 C) 97.3 F (36.3 C)  TempSrc: Oral Oral Oral Oral  Resp: 20 20 16 18   Height:      Weight:   74.6 kg (164 lb 7.4 oz)   SpO2: 100% 94% 97% 96%    General:  NAD Cardiovascular: S1 and S2 Present, aortic systolic Murmur, no JVD Respiratory: Bilateral Air entry present and faint basal Crackles, no wheezes Abdomen: Bowel Sound present, Soft and no tenderness Extremities: no Pedal edema, no calf tenderness  Data Reviewed: CBC:  Recent Labs Lab 03/20/15 0400 03/20/15 1335 03/21/15 0406 03/22/15 0359 03/23/15 0508 03/24/15 0653  WBC 5.8 5.8 5.1 5.6 6.4 7.6  NEUTROABS 4.2  --  3.6 3.5 4.5 5.3  HGB 7.4* 7.5* 7.4* 8.8* 9.1* 9.8*  HCT 24.0* 24.2* 23.8* 28.4* 28.7* 31.8*  MCV 96.0 93.8 94.1 92.2 90.3 91.1  PLT 135* 149* 142* 157 143* [id number]   Basic Metabolic Panel:  Recent Labs Lab 03/18/15 0322  03/20/15 0400 03/21/15 0406 03/22/15 0359 03/22/15 1535 03/23/15 0508 03/24/15 0653  NA 142  < > 142 144 143  --  142 142  K 3.8  < > 3.6 3.4* 3.3*  --  3.5 4.0  CL 105  < > 106 105 103  --  100* 103  CO2 22  < > 27 29 30   --  30 28  GLUCOSE 85  < > 113* 95 120*  --  124* 118*  BUN 22*  < > 19 17 17   --  16 16  CREATININE 1.00  < > 0.82 0.80 0.75  --  0.74 0.71  CALCIUM 7.5*  < > 7.4* 7.5* 7.7*  --  8.1* 8.6*  MG 1.8  --   --   --  1.3* 1.8 1.5*  --   PHOS 4.2  < > 3.3 3.0 2.4* 2.9 2.5 2.9  < > = values in this interval not displayed. Liver Function Tests:  Recent Labs Lab 03/20/15 0400 03/21/15 0406 03/22/15 0359 03/23/15 0508 03/24/15 0653  ALBUMIN 2.4* 2.4* 2.4* 2.5* 2.6*   No results for input(s): LIPASE, AMYLASE in the last 168 hours. No results for input(s): AMMONIA in the last 168 hours.  Cardiac Enzymes: No results for input(s): CKTOTAL, CKMB, CKMBINDEX, TROPONINI in the  last 168 hours.  BNP (last 3 results)  Recent Labs  08/07/14 1158 08/10/14 1715 08/24/14 1042  BNP 343.0* 354.4* 478.7*    CBG:  Recent Labs Lab 03/23/15 1135 03/23/15 1656 03/23/15 2216 03/24/15 0729 03/24/15 1357  GLUCAP 178* 117* 138* 111* 110*    Recent Results (from the past 240 hour(s))  Culture, blood (Routine X 2) w Reflex to ID Panel     Status: None   Collection Time: 03/16/15  8:25 AM  Result Value Ref Range Status   Specimen Description BLOOD RIGHT WRIST  Final   Special Requests BOTTLES DRAWN AEROBIC AND ANAEROBIC 5CC  Final   Culture   Final    NO GROWTH 5 DAYS Performed at [facility]    Report Status 03/21/2015 FINAL  Final  Culture, blood (routine x 2)     Status: None   Collection Time: 03/16/15 10:34 AM  Result Value Ref Range Status   Specimen Description BLOOD LEFT HAND  Final   Special Requests IN PEDIATRIC BOTTLE 3CC  Final   Culture   Final    NO GROWTH 5 DAYS Performed at [facility]    Report Status 03/21/2015 FINAL  Final  Culture, blood (Routine X 2) w Reflex to ID Panel     Status: None   Collection Time: 03/16/15 10:35 AM  Result Value Ref Range Status   Specimen Description BLOOD LEFT ARM  Final   Special Requests IN PEDIATRIC BOTTLE 3CC  Final   Culture   Final    NO GROWTH 5 DAYS Performed at [facility]    Report Status 03/21/2015 FINAL  Final  Urine culture     Status: None   Collection Time: 03/16/15  1:14 PM  Result Value Ref Range Status   Specimen Description URINE, CATHETERIZED  Final   Special Requests NONE  Final   Culture  Final    NO GROWTH 1 DAY Performed at [facility]    Report Status 03/17/2015 FINAL  Final     Studies: Mr Thoracic Spine W Wo Contrast  03/24/2015  CLINICAL DATA:  Bilateral leg weakness. History of breast cancer with known metastatic disease. Evaluate for disease progression. EXAM: MRI THORACIC SPINE WITHOUT AND WITH CONTRAST; MRI LUMBAR SPINE WITHOUT  AND WITH CONTRAST TECHNIQUE: Multiplanar and multiecho pulse sequences of the thoracic spine were obtained without and with intravenous contrast.; Multiplanar and multiecho pulse sequences of the lumbar spine were obtained without and with intravenous contrast. CONTRAST:  44mL MULTIHANCE GADOBENATE DIMEGLUMINE 529 MG/ML IV SOLN COMPARISON:  None. FINDINGS: Limited study due to patient condition and motion. Thoracic spine: All of the visualized osseous structures, including each vertebra, has extensive metastatic infiltration with sclerotic appearance. No acute pathologic fracture. No epidural infiltration, cord edema, or cord compression to explain lower extremity weakness. There is diffuse spondylosis with small central disc protrusions at T1-2, T4-5, T5-6, and T6-7 -without neural contact. Diffuse facet arthropathy with spurring and ligamentum flavum thickening. No focal or high-grade foraminal stenosis. Known bilateral pleural effusions, larger on the left. Known hepatic metastatic disease. Lumbar spine: Segmentation: Standard. Alignment: Mild L4-5 anterolisthesis, facet mediated Vertebrae: Sclerotic metastases throughout the entire skeleton. No visible epidural tumor infiltration. No evidence of discitis or pathologic fracture. Conus: Extends to the L1-2 disc level and appears normal. Paraspinal and retroperitoneal structures: Notable fatty atrophy and of intrinsic back muscles. New compared to abdominal MRI 02/02/2005 is edematous signal and enhancement in the medial right psoas muscle with maintained internal vesicular architecture favoring strain or other non metastatic process. No paraspinal metastasis identified. No primary discitis or abscess. Disc narrowing and spondylotic endplate spurring which is typical for age. No herniation. There is diffuse facet arthropathy, advanced at L3-4 and L4-5. Bulky overgrowth and joint effusions present L3-4 and L4-5 with bilateral L4-5 subarticular recess stenosis and  descending L5 impingement. IMPRESSION: 1. Diffuse thoracic and lumbar metastasis without epidural infiltration or cord compromise. No specific explanation for bilateral leg weakness. 2. Edematous/inflammatory changes in the right psoas which are new from January 2017 abdominal MRI, favor strain. 3. Advanced facet arthropathy in the lumbar spine, especially at L4-5 where there is bilateral subarticular recess stenosis and descending L5 impingement. Electronically Signed   By: Monte Fantasia M.D.   On: 03/24/2015 12:53   Mr Lumbar Spine W Wo Contrast  03/24/2015  CLINICAL DATA:  Bilateral leg weakness. History of breast cancer with known metastatic disease. Evaluate for disease progression. EXAM: MRI THORACIC SPINE WITHOUT AND WITH CONTRAST; MRI LUMBAR SPINE WITHOUT AND WITH CONTRAST TECHNIQUE: Multiplanar and multiecho pulse sequences of the thoracic spine were obtained without and with intravenous contrast.; Multiplanar and multiecho pulse sequences of the lumbar spine were obtained without and with intravenous contrast. CONTRAST:  10mL MULTIHANCE GADOBENATE DIMEGLUMINE 529 MG/ML IV SOLN COMPARISON:  None. FINDINGS: Limited study due to patient condition and motion. Thoracic spine: All of the visualized osseous structures, including each vertebra, has extensive metastatic infiltration with sclerotic appearance. No acute pathologic fracture. No epidural infiltration, cord edema, or cord compression to explain lower extremity weakness. There is diffuse spondylosis with small central disc protrusions at T1-2, T4-5, T5-6, and T6-7 -without neural contact. Diffuse facet arthropathy with spurring and ligamentum flavum thickening. No focal or high-grade foraminal stenosis. Known bilateral pleural effusions, larger on the left. Known hepatic metastatic disease. Lumbar spine: Segmentation: Standard. Alignment: Mild L4-5 anterolisthesis, facet mediated Vertebrae:  Sclerotic metastases throughout the entire skeleton. No  visible epidural tumor infiltration. No evidence of discitis or pathologic fracture. Conus: Extends to the L1-2 disc level and appears normal. Paraspinal and retroperitoneal structures: Notable fatty atrophy and of intrinsic back muscles. New compared to abdominal MRI 02/02/2005 is edematous signal and enhancement in the medial right psoas muscle with maintained internal vesicular architecture favoring strain or other non metastatic process. No paraspinal metastasis identified. No primary discitis or abscess. Disc narrowing and spondylotic endplate spurring which is typical for age. No herniation. There is diffuse facet arthropathy, advanced at L3-4 and L4-5. Bulky overgrowth and joint effusions present L3-4 and L4-5 with bilateral L4-5 subarticular recess stenosis and descending L5 impingement. IMPRESSION: 1. Diffuse thoracic and lumbar metastasis without epidural infiltration or cord compromise. No specific explanation for bilateral leg weakness. 2. Edematous/inflammatory changes in the right psoas which are new from January 2017 abdominal MRI, favor strain. 3. Advanced facet arthropathy in the lumbar spine, especially at L4-5 where there is bilateral subarticular recess stenosis and descending L5 impingement. Electronically Signed   By: Monte Fantasia M.D.   On: 03/24/2015 12:53     Scheduled Meds: . aspirin  81 mg Oral Daily  . atorvastatin  80 mg Oral Daily  . carvedilol  15.625 mg Oral BID WC  . clopidogrel  75 mg Oral Daily  . feeding supplement (ENSURE ENLIVE)  237 mL Oral BID BM  . furosemide  40 mg Oral Daily  . heparin subcutaneous  5,000 Units Subcutaneous 3 times per day  . insulin aspart  0-5 Units Subcutaneous QHS  . insulin aspart  0-9 Units Subcutaneous TID WC  . levETIRAcetam  1,000 mg Oral BID  . levothyroxine  175 mcg Oral QAC breakfast  . magnesium sulfate 1 - 4 g bolus IVPB  2 g Intravenous Once  . megestrol  400 mg Oral Daily  . pantoprazole  40 mg Oral Daily  . potassium  chloride  40 mEq Oral Daily  . sodium chloride flush  10-40 mL Intracatheter Q12H   Continuous Infusions: . sodium chloride 10 mL/hr at 03/20/15 1500   PRN Meds: acetaminophen **OR** acetaminophen, artificial tears, ipratropium-albuterol, morphine, ondansetron **OR** ondansetron (ZOFRAN) IV, sodium chloride flush  Time spent: 30 minutes  Author:  Eulogio Bear DO [id number]  03/24/2015 4:10 PM  If 7PM-7AM, please contact night-coverage at [email or web address], password [facility]

## 2015-03-25 ENCOUNTER — Other Ambulatory Visit: Payer: Self-pay | Admitting: Oncology

## 2015-03-25 LAB — CBC WITH DIFFERENTIAL/PLATELET
BASOS ABS: 0 10*3/uL (ref 0.0–0.1)
Basophils Relative: 0 %
EOS PCT: 4 %
Eosinophils Absolute: 0.2 10*3/uL (ref 0.0–0.7)
HEMATOCRIT: 29 % — AB (ref 36.0–46.0)
Hemoglobin: 8.9 g/dL — ABNORMAL LOW (ref 12.0–15.0)
LYMPHS PCT: 26 %
Lymphs Abs: 1.5 10*3/uL (ref 0.7–4.0)
MCH: 28.3 pg (ref 26.0–34.0)
MCHC: 30.7 g/dL (ref 30.0–36.0)
MCV: 92.4 fL (ref 78.0–100.0)
Monocytes Absolute: 0.6 10*3/uL (ref 0.1–1.0)
Monocytes Relative: 9 %
NEUTROS ABS: 3.7 10*3/uL (ref 1.7–7.7)
NEUTROS PCT: 61 %
PLATELETS: 169 10*3/uL (ref 150–400)
RBC: 3.14 MIL/uL — AB (ref 3.87–5.11)
RDW: 18.9 % — ABNORMAL HIGH (ref 11.5–15.5)
WBC: 6 10*3/uL (ref 4.0–10.5)

## 2015-03-25 LAB — RENAL FUNCTION PANEL
ALBUMIN: 2.4 g/dL — AB (ref 3.5–5.0)
ANION GAP: 10 (ref 5–15)
BUN: 15 mg/dL (ref 6–20)
CO2: 28 mmol/L (ref 22–32)
Calcium: 8.5 mg/dL — ABNORMAL LOW (ref 8.9–10.3)
Chloride: 103 mmol/L (ref 101–111)
Creatinine, Ser: 0.85 mg/dL (ref 0.44–1.00)
GFR calc Af Amer: >60 mL/min (ref >60)
Glucose, Bld: 108 mg/dL — ABNORMAL HIGH (ref 65–99)
PHOSPHORUS: 3.3 mg/dL (ref 2.5–4.6)
POTASSIUM: 4.3 mmol/L (ref 3.5–5.1)
Sodium: 141 mmol/L (ref 135–145)

## 2015-03-25 LAB — GLUCOSE, CAPILLARY
GLUCOSE-CAPILLARY: 140 mg/dL — AB (ref 65–99)
Glucose-Capillary: 95 mg/dL (ref 65–99)

## 2015-03-25 LAB — LEVETIRACETAM LEVEL: Levetiracetam Lvl: 70 ug/mL — ABNORMAL HIGH (ref 10.0–40.0)

## 2015-03-25 LAB — MAGNESIUM: Magnesium: 2 mg/dL (ref 1.7–2.4)

## 2015-03-25 MED ORDER — IPRATROPIUM-ALBUTEROL 0.5-2.5 (3) MG/3ML IN SOLN: 3.0000 mL | RESPIRATORY_TRACT | Status: AC | PRN

## 2015-03-25 MED ORDER — CARVEDILOL 3.125 MG PO TABS: 15.6250 mg | ORAL_TABLET | Freq: Two times a day (BID) | ORAL | Status: AC

## 2015-03-25 MED ORDER — INSULIN ASPART 100 UNIT/ML ~~LOC~~ SOLN: 0.0000 [IU] | Freq: Every day | SUBCUTANEOUS | Status: AC

## 2015-03-25 MED ORDER — POTASSIUM CHLORIDE CRYS ER 20 MEQ PO TBCR: 20.0000 meq | EXTENDED_RELEASE_TABLET | Freq: Every day | ORAL | Status: AC

## 2015-03-25 MED ORDER — LEVETIRACETAM 1000 MG PO TABS: 1000.0000 mg | ORAL_TABLET | Freq: Two times a day (BID) | ORAL | Status: AC

## 2015-03-25 MED ORDER — MEGESTROL ACETATE 400 MG/10ML PO SUSP: 400.0000 mg | Freq: Every day | ORAL | Status: AC

## 2015-03-25 MED ORDER — INSULIN ASPART 100 UNIT/ML ~~LOC~~ SOLN: 0.0000 [IU] | Freq: Three times a day (TID) | SUBCUTANEOUS | Status: AC

## 2015-03-25 MED ORDER — FUROSEMIDE 40 MG PO TABS: 40.0000 mg | ORAL_TABLET | Freq: Every day | ORAL | Status: AC

## 2015-03-25 MED ORDER — ACETAMINOPHEN 325 MG PO TABS: 650.0000 mg | ORAL_TABLET | Freq: Four times a day (QID) | ORAL | Status: AC | PRN

## 2015-03-25 MED ORDER — ENSURE ENLIVE PO LIQD: 237.0000 mL | Freq: Two times a day (BID) | ORAL | Status: AC

## 2015-03-25 MED ORDER — PANTOPRAZOLE SODIUM 40 MG PO TBEC: 40.0000 mg | DELAYED_RELEASE_TABLET | Freq: Every day | ORAL | Status: AC

## 2015-03-25 MED ORDER — HEPARIN SOD (PORK) LOCK FLUSH 100 UNIT/ML IV SOLN
500.0000 [IU] | INTRAVENOUS | Status: AC | PRN
  Administered 2015-03-25: 500 [IU]

## 2015-03-25 MED ORDER — LEVOTHYROXINE SODIUM 175 MCG PO TABS: 175.0000 ug | ORAL_TABLET | Freq: Every day | ORAL | Status: DC

## 2015-03-25 MED ORDER — MORPHINE SULFATE 15 MG PO TABS: 7.5000 mg | ORAL_TABLET | Freq: Four times a day (QID) | ORAL | Status: DC | PRN

## 2015-03-25 MED ORDER — ARTIFICIAL TEARS OP OINT: 1.0000 "application " | TOPICAL_OINTMENT | OPHTHALMIC | Status: AC | PRN

## 2015-03-25 MED ORDER — MORPHINE SULFATE 15 MG PO TABS: 7.5000 mg | ORAL_TABLET | Freq: Four times a day (QID) | ORAL | Status: AC | PRN

## 2015-03-25 NOTE — Discharge Summary (Signed)
Physician Discharge Summary  BRILEE PORT HWK:[id number] DOB: 1940/11/18 DOA: 03/02/2015  PCP: [name], MD  Admit date: 03/02/2015 Discharge date: 03/25/2015  Time spent: 55  minutes  Recommendations for Outpatient Follow-up:  1. DNR 2. Palliative to follow at SNF- poor overall prognosis 3. Encourage PO intake- megace started 4. CBC, BMP 1 week 5. keppra level pending 6. 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[place])   Diabetes mellitus with complication (HCC)   Anemia of chronic renal failure, stage 4 (severe) (HCC)   Pain from bone metastases (HCC)   Hypokalemia   Pressure ulcer   Elevated troponin   Cardiomyopathy, ischemic   Severe mitral regurgitation   Acute systolic CHF (congestive heart failure) (HCC)   Anasarca   PAT (paroxysmal atrial tachycardia) (HCC)   Multifocal atrial tachycardia (HCC)   Premature atrial contractions   ESBL (extended spectrum beta-lactamase) producing bacteria infection   Demand ischemia of myocardium (HCC)   Acute respiratory failure (HCC)   Acute encephalopathy   Seizure-like activity ([place])   Seizure ([place])   Discharge Condition: improved  Diet recommendation: regular  Filed Weights   03/23/15 0558 03/24/15 0529 03/25/15 0559  Weight: 76 kg (167 lb 8.8 oz) 74.6 kg (164 lb 7.4 oz) 70.943 kg (156 lb 6.4 oz)    History of present illness:  Brittney Cunningham is an 75 y.o. female with a PMH of stage IV right breast cancer diagnosed May/2011 with metastasis to the bone and liver status post neoadjuvant therapy, right modified radical mastectomy 03/2010 and radiation treatment, under the care of Dr. [name] who is currently treating her for progressive disease with eribulin with last dose given 02/24/15 followed by Neulasta support, who presented to her  PCPs office earlier today with a one-week history of orthostatic dizziness. Dr. Anitra Lauth stopped her Lasix and reduced her Coreg dose 1 week prior which initially helped her symptoms, but they returned today. 2 days ago, the patient reports the onset of nasal congestion, rhinorrhea, cough, and worsening fatigue. She also had some headache and left-sided neck pain. She denies any associated fevers or shortness of breath. She has had myalgias, but this is not unusual after Neulasta. She has received a flu vaccination. She has not used any over-the-counter cough medications or other treatments. Her PCP recommended she be seen at [facility], and upon evaluation there, she was discovered to be tachycardic with a fever of 100.8. WBC was normal, but she was recently neutropenic.   Hospital Course:  1. Sepsis ([place]) Initial admission for sepsis due to healthcare associated pneumonia. As well as ESBL UTI. Patient treated with fosfomycin for ESBL and has been treated with broad-spectrum antibiotics and completed the course of treatment. Currently no fever no leukocytosis.  2. PEA arrest  Patient had new onset seizure which was followed by PEA arrest. Development 03/16/2015. Patient was intubated and was in the ICU care. Extubated on 03/18/2015. Likely secondary to hypoxia as well as seizure. Currently stable. Her CODE STATUS in the interim has been changed to DO NOT RESUSCITATE   3. New onset seizure. 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 Management per oncology. Seizures are more likely secondary to dural involvement of the malignancy. Currently continuing Keppra- awaiting level  4. Acute combined CHF. Elevated troponin. Aspect of ischemic cardiomyopathy with severe MR Multifocal  atrial tachycardia Cardiology was consulted. Ejection fraction 30-35% with akinesis of basal mid inferior myocardium. Patient was recommended to be treated conservatively given her metastatic cancer. Currently on aspirin and  Plavix. Also on Lipitor as well as carvedilol.  Continue Lasix Patient had an event of near syncope on 03/22/2015 no acute event noted on telemetry- needs to mobilize  5. Hypothyroidism. Increase Synthroid. TSH is significantly elevated with free T4 near normal range. We will increase the Synthroid to 175 from 150  6. Chronic pain syndrome. On morphine as needed. Dose has been reduced due to patient's lethargy  7. Diabetes mellitus type 2. On sliding scale insulin Will continue to monitor Adjust as PO intake increases  8. Anemia of chronic disease. H&H stabilizing at present. Although given her weakness as well as shortness of breath and fatigue with CHF I would transfuse her. Maintain H&H of 08. No active bleeding.  9. Profound generalized weakness. Patient has significant weakness primarily in her lower extremity--? Due to deconditioning  neurology does  not think that this is due to [place]. keppra level is currently pending. MRI shows: Diffuse thoracic and lumbar metastasis without epidural infiltration or cord compromise  Spoke with daughter-- patient needs to eat and mobilize if does not do well as rehab, will need hospice referral   Procedures:  Intubation  EEG  Consultations:  Neuro  PCCM  Cardiology  Heme onc  Discharge Exam: Filed Vitals:   03/24/15 2224 03/25/15 0559  BP: 131/37 134/51  Pulse: 78 84  Temp: 98 F (36.7 C) 98.2 F (36.8 C)  Resp: 18 20    General: chronically ill appearing Cardiovascular: rrr   Discharge Instructions   Discharge Instructions    Diet general    Complete by:  As directed      Discharge instructions    Complete by:  As directed   Palliative care to follow at SNF DNR Cbc, BMP 1 week     Increase activity slowly    Complete by:  As directed           Current Discharge Medication List    START taking these medications   Details  acetaminophen (TYLENOL) 325 MG tablet Take 2 tablets (650 mg total) by  mouth every 6 (six) hours as needed for mild pain (or Fever >/= 101).    artificial tears (LACRILUBE) OINT ophthalmic ointment Place 1 application into both eyes every 4 (four) hours as needed for dry eyes. Refills: 0    feeding supplement, ENSURE ENLIVE, (ENSURE ENLIVE) LIQD Take 237 mLs by mouth 2 (two) times daily between meals. Qty: 237 mL, Refills: 12    furosemide (LASIX) 40 MG tablet Take 1 tablet (40 mg total) by mouth daily. Qty: 30 tablet    !! insulin aspart (NOVOLOG) 100 UNIT/ML injection Inject 0-5 Units into the skin at bedtime. Qty: 10 mL, Refills: 11    !! insulin aspart (NOVOLOG) 100 UNIT/ML injection Inject 0-9 Units into the skin 3 (three) times daily with meals. Qty: 10 mL, Refills: 11    ipratropium-albuterol (DUONEB) 0.5-2.5 (3) MG/3ML SOLN Take 3 mLs by nebulization every 4 (four) hours as needed. Qty: 360 mL    levETIRAcetam (KEPPRA) 1000 MG tablet Take 1 tablet (1,000 mg total) by mouth 2 (two) times daily.    megestrol (MEGACE) 400 MG/10ML suspension Take 10 mLs (400 mg total) by mouth daily. Qty: 240 mL, Refills: 0    pantoprazole (PROTONIX) 40 MG tablet Take 1 tablet (40 mg total)  by mouth daily.     !! - Potential duplicate medications found. Please discuss with provider.    CONTINUE these medications which have CHANGED   Details  carvedilol (COREG) 3.125 MG tablet Take 5 tablets (15.625 mg total) by mouth 2 (two) times daily with a meal.    levothyroxine (SYNTHROID, LEVOTHROID) 175 MCG tablet Take 1 tablet (175 mcg total) by mouth daily before breakfast.    morphine (MSIR) 15 MG tablet Take 0.5 tablets (7.5 mg total) by mouth every 6 (six) hours as needed for severe pain. Qty: 15 tablet, Refills: 0    potassium chloride SA (K-DUR,KLOR-CON) 20 MEQ tablet Take 1 tablet (20 mEq total) by mouth daily.      CONTINUE these medications which have NOT CHANGED   Details  aspirin 81 MG chewable tablet Chew 81 mg by mouth daily.    atorvastatin  (LIPITOR) 80 MG tablet Take 1 tablet (80 mg total) by mouth daily. Qty: 30 tablet, Refills: 11    calcium carbonate (TUMS) 500 MG chewable tablet Chew 1 tablet (200 mg of elemental calcium total) by mouth 3 (three) times daily with meals. Qty: 30 tablet, Refills: 0    clopidogrel (PLAVIX) 75 MG tablet Take 1 tablet (75 mg total) by mouth daily. Qty: 30 tablet, Refills: 8   Associated Diagnoses: PVD (peripheral vascular disease) ([place])    glycerin adult (GLYCERIN ADULT) 2 G SUPP Place 1 suppository rectally once as needed (constipation). Qty: 10 suppository, Refills: 0    ondansetron (ZOFRAN) 8 MG tablet Take 8 mg by mouth every 8 (eight) hours as needed for nausea or vomiting.     senna-docusate (SENOKOT-S) 8.6-50 MG per tablet Take 1 tablet by mouth at bedtime.     lidocaine-prilocaine (EMLA) cream Apply to affected area once Qty: 30 g, Refills: 3   Associated Diagnoses: Cancer of overlapping sites of right female breast ([place]); Breast cancer metastasized to bone, right (HCC)    polyethylene glycol (MIRALAX / GLYCOLAX) packet Take 17 g by mouth daily as needed for mild constipation. Reported on 02/17/2015      STOP taking these medications     cyanocobalamin (,VITAMIN B-12,) 1000 MCG/ML injection      gabapentin (NEURONTIN) 100 MG capsule      insulin detemir (LEVEMIR) 100 UNIT/ML injection      insulin lispro (HUMALOG KWIKPEN) 100 UNIT/ML KiwkPen      mirtazapine (REMERON) 15 MG tablet      morphine (MS CONTIN) 30 MG 12 hr tablet      prochlorperazine (COMPAZINE) 10 MG tablet      ACCU-CHEK AVIVA PLUS test strip      Blood Glucose Monitoring Suppl (ACCU-CHEK NANO SMARTVIEW) W/DEVICE KIT      darbepoetin (ARANESP) 200 MCG/0.4ML SOLN      nitroGLYCERIN (NITROSTAT) 0.4 MG SL tablet        Allergies  Allergen Reactions  . Chlorhexidine Hives, Itching and Rash    This was most likely a CONTACT DERMATITIS versus true systemic allergic reaction  . Adhesive [Tape] Hives   . Codeine Swelling   Follow-up Information    Follow up with [facility].   Specialty:  Skilled Nursing Facility   Contact information:   [place] [phone]      Follow up with [name], MD In 1 week.   Specialty:  Family Medicine   Contact information:   [address] [place] [address] [phone]  Schedule an appointment as soon as possible for a visit with [place].   Contact information:   [address]     [place] [address] [phone]      Follow up with Chauncey Cruel, MD In 1 month.   Specialty:  Oncology   Contact information:   [place] [address] [phone]        The results of significant diagnostics from this hospitalization (including imaging, microbiology, ancillary and laboratory) are listed below for reference.    Significant Diagnostic Studies: Dg Chest 2 View  03/02/2015  CLINICAL DATA:  Shortness of breath, cough and congestion since Sunday, history coronary artery disease, chronic renal insufficiency, coronary artery disease, hypertension, type II diabetes mellitus, metastatic breast cancer EXAM: CHEST  2 VIEW COMPARISON:  09/21/2014 FINDINGS: RIGHT subclavian Port-A-Cath with tip projecting over SVC. Normal heart size, mediastinal contours, and pulmonary vascularity. Atherosclerotic calcification aorta. Eventration of the anterior RIGHT diaphragm. Increased markings at anterior RIGHT lung base question chronic atelectasis. Remaining lungs grossly clear. No pleural effusion or pneumothorax. Diffuse osteosclerosis again identified, patchy, likely related to known osseous metastatic disease. IMPRESSION: Chronic RIGHT basilar atelectasis. No definite acute pulmonary abnormalities. Diffuse osseous metastatic disease. Electronically Signed   By: Lavonia Dana M.D.   On: 03/02/2015 12:33   Ct Head Wo  Contrast  03/16/2015  CLINICAL DATA:  Seizure, stage IV breast cancer. EXAM: CT HEAD WITHOUT CONTRAST TECHNIQUE: Contiguous axial images were obtained from the base of the skull through the vertex without intravenous contrast. COMPARISON:  CT scan of July 01, 2009. FINDINGS: Bilateral maxillary sinusitis is noted. Lucencies are noted throughout the skull which may represent metastatic disease. Minimal diffuse cortical atrophy is noted. Minimal chronic ischemic white matter disease is noted. No mass effect or midline shift is noted. Ventricular size is within normal limits. There is no evidence of mass lesion, hemorrhage or acute infarction. IMPRESSION: Lucencies are noted throughout the skull concerning for metastatic disease as noted on prior exam. Minimal diffuse cortical atrophy and chronic ischemic white matter disease. No acute intracranial abnormality seen. Electronically Signed   By: Marijo Conception, M.D.   On: 03/16/2015 09:22   Ct Chest Wo Contrast  03/06/2015  CLINICAL DATA:  Dyspnea. Dizziness, fatigue, and upper respiratory symptoms. Tachycardia, tachypnea, and fever. Metastatic right breast cancer currently undergoing chemotherapy. EXAM: CT CHEST WITHOUT CONTRAST TECHNIQUE: Multidetector CT imaging of the chest was performed following the standard protocol without IV contrast. COMPARISON:  01/12/2015 FINDINGS: There is new diffuse body wall edema and on organized subcutaneous fluid, greatest in the right lateral chest wall and incompletely visualized. Sequelae of right mastectomy are again identified. A right subclavian Port-A-Cath remains in place and terminates at the cavoatrial junction. Diffuse aortic and coronary artery calcifications are noted. The heart is normal in size. No enlarged axillary, mediastinal, or hilar lymph nodes are identified. Small left axillary lymph nodes measure up to 9 mm in short axis. Small mediastinal lymph nodes measure up to 8 mm in short axis. There are new, small  bilateral pleural effusions with overlying dependent atelectasis and/or consolidation in the lower lobes. Emphysematous changes are greatest in the right upper lobe. There is new ground-glass and patchy consolidative opacity in the posterior right upper lobe. Small nodules along the right major fissure on the prior study are not well seen on this examination due to adjacent lung opacity and motion artifact. Subpleural reticulation is noted in the anterior right  upper and right middle lobes related to prior radiation therapy. 5 mm left lower lobe nodule is unchanged (series 6, image 37). Small nodules along the left major fissure unchanged. Other small nodules seen on the prior study as well as plaque-like pleural thickening in the right lower lobe are obscured on the current examination by parenchymal consolidation/atelectasis and motion artifact. Small focus of residual opacity in the lingula on the prior study has resolved. The numerous liver lesions described on the prior study are not well seen on this noncontrast examination. Calcified atherosclerosis is noted in the upper abdomen. Sclerotic osseous metastases are again seen throughout the visualized skeleton with multiple nonacute rib fractures again noted. IMPRESSION: 1. Posterior right upper lobe opacity compatible with pneumonia. 2. Small bilateral pleural effusions with bilateral lower lobe atelectasis/consolidation. 3. Multiple previously described lung nodules are obscured on the current examination due to the above acute changes. The 5 mm left lower lobe nodule is unchanged. 4. Similar appearance of diffuse osseous metastases. Electronically Signed   By: Logan Bores M.D.   On: 03/06/2015 16:41   Ct Angio Chest Pe W/cm &/or Wo Cm  03/16/2015  CLINICAL DATA:  Post arrest, post intubation. Stage IV right breast cancer, unresponsive. EXAM: CT ANGIOGRAPHY CHEST WITH CONTRAST TECHNIQUE: Multidetector CT imaging of the chest was performed using the  standard protocol during bolus administration of intravenous contrast. Multiplanar CT image reconstructions and MIPs were obtained to evaluate the vascular anatomy. CONTRAST:  162m OMNIPAQUE IOHEXOL 350 MG/ML SOLN COMPARISON:  03/06/2015 FINDINGS: There is cardiomegaly. Aorta is normal caliber. Densely calcified coronary arteries diffusely. No filling defects in the pulmonary arteries to suggest pulmonary emboli. There are moderate bilateral pleural effusions, increasing since prior study. Probable compressive atelectasis in the lower lobes bilaterally. Mild ground-glass opacities throughout the lungs could reflect mild edema. No mediastinal, hilar, or axillary adenopathy. Previously seen nodularity in the lungs not well visualized due to airspace disease and ground-glass opacities. Diffuse sclerotic metastases throughout the visualized bony thorax. Right Port-A-Cath is in place with the tip in the SVC. Previously described numerous liver lesions cannot be visualized on this chest CTA. Review of the MIP images confirms the above findings. IMPRESSION: No evidence of pulmonary embolus. Moderate bilateral pleural effusions, enlarging since prior study. Probable compressive atelectasis versus consolidation in the lower lobes. Cardiomegaly.  Mild ground-glass opacities may reflect early edema. Diffuse coronary artery disease. Diffuse sclerotic bony metastases. Electronically Signed   By: KRolm BaptiseM.D.   On: 03/16/2015 09:37   Mr Brain Wo Contrast  03/16/2015  CLINICAL DATA:  Metastatic breast cancer with possible seizure. EXAM: MRI HEAD WITHOUT CONTRAST TECHNIQUE: Multiplanar, multiecho pulse sequences of the brain and surrounding structures were obtained without intravenous contrast. COMPARISON:  None. FINDINGS: Partial study (only sagittal T1 and diffusion imaging obtained) due to seizure during image acquisition. The available images are motion degraded. There is no evidence of infarct, hydrocephalus, or  shift. Diffuse marrow hypo intensity correlating with history of widespread osseous metastases. Chronic sinusitis with diffuse mucosal thickening in the maxillary sinuses. IMPRESSION: 1. Incomplete study due to seizure. 2. Negative for acute infarct. Electronically Signed   By: JMonte FantasiaM.D.   On: 03/16/2015 15:54   Mr BJeri CosWYNContrast  03/17/2015  ADDENDUM REPORT: 03/17/2015 11:44 ADDENDUM: 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 Electronically Signed   By: ALogan BoresM.D.   On: 03/17/2015 11:44  03/17/2015  CLINICAL DATA:  Possible seizure. History of metastatic breast cancer. EXAM: MRI HEAD  WITHOUT AND WITH CONTRAST TECHNIQUE: Multiplanar, multiecho pulse sequences of the brain and surrounding structures were obtained without and with intravenous contrast. CONTRAST:  72m MULTIHANCE GADOBENATE DIMEGLUMINE 529 MG/ML IV SOLN COMPARISON:  Incomplete brain MRI 03/16/2015.  Head CT 03/16/2015. FINDINGS: Dedicated thin section imaging through the temporal lobes demonstrates symmetric volume of the hippocampi without signal abnormality identified. There is no evidence of acute infarct, intracranial hemorrhage, mass, midline shift, or extra-axial fluid collection. There is mild generalized cerebral atrophy. T2 hyperintensities in the subcortical and periventricular white matter are compatible with mild chronic small vessel ischemic disease. No abnormal brain parenchymal enhancement or leptomeningeal enhancement is identified. There is mild diffuse pachymeningeal enhancement bilaterally. This is largely thin and smooth in nature, however there is mild nodularity most notable in the right frontal region measuring 4-5 mm in thickness. There is moderate circumferential mucosal thickening in the maxillary sinuses with mild thickening in a posterior left ethmoid air cell. A trace left mastoid effusion is noted. Major intracranial vascular flow voids are preserved. Diffusely abnormal bone marrow signal is again noted  and consistent with widespread osseous metastases. IMPRESSION: 1. No acute infarct. 2. No evidence of parenchymal brain metastases. 3. Diffuse pachymeningeal enhancement. This may represent a combination of reactive dural thickening related to widespread skull metastases as well as dural involvement by tumor, particularly in the right frontal region. 4. Mild chronic small vessel ischemic disease and cerebral atrophy. Electronically Signed: By: ALogan BoresM.D. On: 03/17/2015 11:39   Mr Thoracic Spine W Wo Contrast  03/24/2015  CLINICAL DATA:  Bilateral leg weakness. History of breast cancer with known metastatic disease. Evaluate for disease progression. EXAM: MRI THORACIC SPINE WITHOUT AND WITH CONTRAST; MRI LUMBAR SPINE WITHOUT AND WITH CONTRAST TECHNIQUE: Multiplanar and multiecho pulse sequences of the thoracic spine were obtained without and with intravenous contrast.; Multiplanar and multiecho pulse sequences of the lumbar spine were obtained without and with intravenous contrast. CONTRAST:  144mMULTIHANCE GADOBENATE DIMEGLUMINE 529 MG/ML IV SOLN COMPARISON:  None. FINDINGS: Limited study due to patient condition and motion. Thoracic spine: All of the visualized osseous structures, including each vertebra, has extensive metastatic infiltration with sclerotic appearance. No acute pathologic fracture. No epidural infiltration, cord edema, or cord compression to explain lower extremity weakness. There is diffuse spondylosis with small central disc protrusions at T1-2, T4-5, T5-6, and T6-7 -without neural contact. Diffuse facet arthropathy with spurring and ligamentum flavum thickening. No focal or high-grade foraminal stenosis. Known bilateral pleural effusions, larger on the left. Known hepatic metastatic disease. Lumbar spine: Segmentation: Standard. Alignment: Mild L4-5 anterolisthesis, facet mediated Vertebrae: Sclerotic metastases throughout the entire skeleton. No visible epidural tumor infiltration.  No evidence of discitis or pathologic fracture. Conus: Extends to the L1-2 disc level and appears normal. Paraspinal and retroperitoneal structures: Notable fatty atrophy and of intrinsic back muscles. New compared to abdominal MRI 02/02/2005 is edematous signal and enhancement in the medial right psoas muscle with maintained internal vesicular architecture favoring strain or other non metastatic process. No paraspinal metastasis identified. No primary discitis or abscess. Disc narrowing and spondylotic endplate spurring which is typical for age. No herniation. There is diffuse facet arthropathy, advanced at L3-4 and L4-5. Bulky overgrowth and joint effusions present L3-4 and L4-5 with bilateral L4-5 subarticular recess stenosis and descending L5 impingement. IMPRESSION: 1. Diffuse thoracic and lumbar metastasis without epidural infiltration or cord compromise. No specific explanation for bilateral leg weakness. 2. Edematous/inflammatory changes in the right psoas which are new from January 2017 abdominal  MRI, favor strain. 3. Advanced facet arthropathy in the lumbar spine, especially at L4-5 where there is bilateral subarticular recess stenosis and descending L5 impingement. Electronically Signed   By: Monte Fantasia M.D.   On: 03/24/2015 12:53   Mr Lumbar Spine W Wo Contrast  03/24/2015  CLINICAL DATA:  Bilateral leg weakness. History of breast cancer with known metastatic disease. Evaluate for disease progression. EXAM: MRI THORACIC SPINE WITHOUT AND WITH CONTRAST; MRI LUMBAR SPINE WITHOUT AND WITH CONTRAST TECHNIQUE: Multiplanar and multiecho pulse sequences of the thoracic spine were obtained without and with intravenous contrast.; Multiplanar and multiecho pulse sequences of the lumbar spine were obtained without and with intravenous contrast. CONTRAST:  14m MULTIHANCE GADOBENATE DIMEGLUMINE 529 MG/ML IV SOLN COMPARISON:  None. FINDINGS: Limited study due to patient condition and motion. Thoracic spine:  All of the visualized osseous structures, including each vertebra, has extensive metastatic infiltration with sclerotic appearance. No acute pathologic fracture. No epidural infiltration, cord edema, or cord compression to explain lower extremity weakness. There is diffuse spondylosis with small central disc protrusions at T1-2, T4-5, T5-6, and T6-7 -without neural contact. Diffuse facet arthropathy with spurring and ligamentum flavum thickening. No focal or high-grade foraminal stenosis. Known bilateral pleural effusions, larger on the left. Known hepatic metastatic disease. Lumbar spine: Segmentation: Standard. Alignment: Mild L4-5 anterolisthesis, facet mediated Vertebrae: Sclerotic metastases throughout the entire skeleton. No visible epidural tumor infiltration. No evidence of discitis or pathologic fracture. Conus: Extends to the L1-2 disc level and appears normal. Paraspinal and retroperitoneal structures: Notable fatty atrophy and of intrinsic back muscles. New compared to abdominal MRI 02/02/2005 is edematous signal and enhancement in the medial right psoas muscle with maintained internal vesicular architecture favoring strain or other non metastatic process. No paraspinal metastasis identified. No primary discitis or abscess. Disc narrowing and spondylotic endplate spurring which is typical for age. No herniation. There is diffuse facet arthropathy, advanced at L3-4 and L4-5. Bulky overgrowth and joint effusions present L3-4 and L4-5 with bilateral L4-5 subarticular recess stenosis and descending L5 impingement. IMPRESSION: 1. Diffuse thoracic and lumbar metastasis without epidural infiltration or cord compromise. No specific explanation for bilateral leg weakness. 2. Edematous/inflammatory changes in the right psoas which are new from January 2017 abdominal MRI, favor strain. 3. Advanced facet arthropathy in the lumbar spine, especially at L4-5 where there is bilateral subarticular recess stenosis and  descending L5 impingement. Electronically Signed   By: JMonte FantasiaM.D.   On: 03/24/2015 12:53   Dg Chest Port 1 View  03/17/2015  CLINICAL DATA:  Respiratory failure EXAM: PORTABLE CHEST 1 VIEW COMPARISON:  Portable chest x-ray of March 16, 2015 FINDINGS: The lungs are well-expanded. The hemidiaphragms remain obscured. The interstitial markings remain increased. The cardiac silhouette remains enlarged. The pulmonary vascularity is not engorged. The endotracheal tube tip lies approximately 3.7 cm above the carina. The esophagogastric tube tip projects below the inferior margin of the image. The Port-A-Cath appliance tip projects over the midportion of the SVC. IMPRESSION: Allowing for differences in positioning there has not been significant interval change since the previous study. Persistent bibasilar atelectasis or pneumonia with small bilateral pleural effusions. Stable cardiomegaly without significant pulmonary vascular congestion. Electronically Signed   By: David  JMartiniqueM.D.   On: 03/17/2015 07:22   Dg Chest Port 1 View  03/16/2015  CLINICAL DATA:  Could in this morning.  Evaluate ET tube placement EXAM: PORTABLE CHEST 1 VIEW COMPARISON:  03/10/2015 FINDINGS: Endotracheal tube is 3.6 cm above the carina. Right  Port-A-Cath tip at the cavoatrial junction. Heart is upper limits normal in size. Bilateral lower lobe airspace opacities and probable layering effusions again noted, similar prior study. IMPRESSION: Bilateral lower lobe airspace opacities and small layering effusions, similar to prior study. Endotracheal tube 3.6 cm above the carina. Electronically Signed   By: Rolm Baptise M.D.   On: 03/16/2015 10:18   Dg Chest Port 1 View  03/10/2015  CLINICAL DATA:  Pneumonia EXAM: PORTABLE CHEST 1 VIEW COMPARISON:  03/06/2015 FINDINGS: Cardiac shadow is stable in appearance. A right chest wall port is seen. Scattered patchy infiltrates are noted throughout both lungs similar to that seen on recent  CT examination. Small bilateral pleural effusions right greater than left are again noted. Changes consistent with metastatic disease in the bone are again noted. IMPRESSION: No change in patchy bilateral pneumonia. Bilateral pleural effusions are seen. Stable bony metastatic disease. Electronically Signed   By: Inez Catalina M.D.   On: 03/10/2015 12:48   Dg Abd Portable 1v  03/17/2015  CLINICAL DATA:  Orogastric tube placement. EXAM: PORTABLE ABDOMEN - 1 VIEW COMPARISON:  CT abdomen and pelvis 04/13/2012.  Abdomen 10/26/2011. FINDINGS: Enteric tube tip is projected over the lower mid abdomen consistent with location in the distal stomach. Scattered gas in the colon without small or large bowel distention. Diffuse heterogeneous bone sclerosis and lucency consistent with diffuse bone metastasis. Residual contrast material noted in the bladder. IMPRESSION: Enteric tube tip projects over the lower mid abdomen, likely in the distal stomach. Diffuse skeletal metastases. Electronically Signed   By: Lucienne Capers M.D.   On: 03/17/2015 01:01    Microbiology: Recent Results (from the past 240 hour(s))  Culture, blood (Routine X 2) w Reflex to ID Panel     Status: None   Collection Time: 03/16/15  8:25 AM  Result Value Ref Range Status   Specimen Description BLOOD RIGHT WRIST  Final   Special Requests BOTTLES DRAWN AEROBIC AND ANAEROBIC 5CC  Final   Culture   Final    NO GROWTH 5 DAYS Performed at [facility]    Report Status 03/21/2015 FINAL  Final  Culture, blood (routine x 2)     Status: None   Collection Time: 03/16/15 10:34 AM  Result Value Ref Range Status   Specimen Description BLOOD LEFT HAND  Final   Special Requests IN PEDIATRIC BOTTLE 3CC  Final   Culture   Final    NO GROWTH 5 DAYS Performed at [facility]    Report Status 03/21/2015 FINAL  Final  Culture, blood (Routine X 2) w Reflex to ID Panel     Status: None   Collection Time: 03/16/15 10:35 AM  Result Value Ref  Range Status   Specimen Description BLOOD LEFT ARM  Final   Special Requests IN PEDIATRIC BOTTLE 3CC  Final   Culture   Final    NO GROWTH 5 DAYS Performed at [facility]    Report Status 03/21/2015 FINAL  Final  Urine culture     Status: None   Collection Time: 03/16/15  1:14 PM  Result Value Ref Range Status   Specimen Description URINE, CATHETERIZED  Final   Special Requests NONE  Final   Culture   Final    NO GROWTH 1 DAY Performed at [facility]    Report Status 03/17/2015 FINAL  Final     Labs: Basic Metabolic Panel:  Recent Labs Lab 03/21/15 0406 03/22/15 0359 03/22/15 1535 03/23/15 9675  03/24/15 0653 03/25/15 0517  NA 144 143  --  142 142 141  K 3.4* 3.3*  --  3.5 4.0 4.3  CL 105 103  --  100* 103 103  CO2 29 30  --  _0 GLUCOSE 95 120*  --  124* 118* 108*  BUN 17 17  --  _1 CREATININE 0.80 0.75  --  0.74 0.71 0.85  CALCIUM 7.5* 7.7*  --  8.1* 8.6* 8.5*  MG  --  1.3* 1.8 1.5*  --  2.0  PHOS 3.0 2.4* 2.9 2.5 2.9 3.3   Liver Function Tests:  Recent Labs Lab 03/21/15 0406 03/22/15 0359 03/23/15 0508 03/24/15 0653 03/25/15 0517  ALBUMIN 2.4* 2.4* 2.5* 2.6* 2.4*   No results for input(s): LIPASE, AMYLASE in the last 168 hours. No results for input(s): AMMONIA in the last 168 hours. CBC:  Recent Labs Lab 03/21/15 0406 03/22/15 0359 03/23/15 0508 03/24/15 0653 03/25/15 0517  WBC 5.1 5.6 6.4 7.6 6.0  NEUTROABS 3.6 3.5 4.5 5.3 3.7  HGB 7.4* 8.8* 9.1* 9.8* 8.9*  HCT 23.8* 28.4* 28.7* 31.8* 29.0*  MCV 94.1 92.2 90.3 91.1 92.4  PLT 142* 157 143* 172 169   Cardiac Enzymes: No results for input(s): CKTOTAL, CKMB, CKMBINDEX, TROPONINI in the last 168 hours. BNP: BNP (last 3 results)  Recent Labs  08/07/14 1158 08/10/14 1715 08/24/14 1042  BNP 343.0* 354.4* 478.7*    ProBNP (last 3 results) No results for input(s): PROBNP in the last 8760 hours.  CBG:  Recent Labs Lab 03/24/15 0729 03/24/15 1357  03/24/15 1710 03/24/15 2025 03/25/15 0750  GLUCAP 111* 110* 156* 175* 95       Signed:  [place] DO  [facility] 03/25/2015, 9:14 AM

## 2015-03-25 NOTE — Care Management Note (Signed)
Case Management Note  Patient Details  Name: SENIYAH SCHRUPP MRN: [id number] Date of Birth: 09-Feb-1940  Subjective/Objective:                    Action/Plan:d/c SNF w/PCS.   Expected Discharge Date:                  Expected Discharge Plan:  Skilled Nursing Facility  In-House Referral:  NA, Clinical Social Work  Discharge planning Services  CM Consult  Post Acute Care Choice:  NA Choice offered to:  NA  DME Arranged:    DME Agency:     HH Arranged:    [place] Agency:     Status of Service:  Completed, signed off  Medicare Important Message Given:  Yes Date Medicare IM Given:    Medicare IM give by:    Date Additional Medicare IM Given:    Additional Medicare Important Message give by:     If discussed at [place] of Stay Meetings, dates discussed:    Additional Comments:  Dessa Phi, RN 03/25/2015, 12:42 PM

## 2015-03-25 NOTE — Clinical Social Work Placement (Signed)
Patient is set to discharge to [facility] SNF today. Patient & daughter, [name] aware. Discharge packet given to RN, Isabell Jarvis called for transport to pickup at 1:30pm.    Raynaldo Opitz, [place] Social Worker cell #: [phone]    CLINICAL SOCIAL WORK PLACEMENT  NOTE  Date:  03/25/2015  Patient Details  Name: Brittney Cunningham MRN: [id number] Date of Birth: 31-Oct-1940  Clinical Social Work is seeking post-discharge placement for this patient at the [place] level of care (*CSW will initial, date and re-position this form in  chart as items are completed):  Yes   Patient/family provided with [place] Work Department's list of facilities offering this level of care within the geographic area requested by the patient (or if unable, by the patient's family).  Yes   Patient/family informed of their freedom to choose among providers that offer the needed level of care, that participate in Medicare, Medicaid or managed care program needed by the patient, have an available bed and are willing to accept the patient.  Yes   Patient/family informed of [facility]'s ownership interest in [facility] and [facility], as well as of the fact that they are under no obligation to receive care at these facilities.  PASRR submitted to EDS on       PASRR number received on       Existing PASRR number confirmed on 03/08/15     FL2 transmitted to all facilities in geographic area requested by pt/family on 03/08/15     FL2 transmitted to all facilities within larger geographic area on 03/08/15     Patient informed that his/her managed care company has contracts with or will negotiate with certain facilities, including the following:        Yes   Patient/family informed of bed offers received.  Patient chooses bed at [facility]     Physician recommends and patient chooses bed at      Patient to be transferred to  [facility] on 03/25/15.  Patient to be transferred to facility by [facility]     Patient family notified on 03/25/15 of transfer.  Name of family member notified:  patient's daughter, [name] via phone     PHYSICIAN       Additional Comment:    _______________________________________________ Standley Brooking, LCSW 03/25/2015, 12:03 PM

## 2015-03-26 ENCOUNTER — Telehealth: Payer: Self-pay | Admitting: Oncology

## 2015-03-26 NOTE — Telephone Encounter (Signed)
S/w pt's relative oscar, Advised appt 3/1 has been canceled. Hesayas pt is at nursing home but that he would relay msg.

## 2015-03-31 ENCOUNTER — Ambulatory Visit: Payer: Self-pay | Admitting: Nurse Practitioner

## 2015-03-31 ENCOUNTER — Other Ambulatory Visit: Payer: Self-pay

## 2015-04-01 ENCOUNTER — Other Ambulatory Visit: Payer: Self-pay | Admitting: *Deleted

## 2015-04-01 MED ORDER — LEVOTHYROXINE SODIUM 175 MCG PO TABS: 175.0000 ug | ORAL_TABLET | Freq: Every day | ORAL | Status: AC

## 2015-04-01 NOTE — Telephone Encounter (Signed)
Fax from pharmacy.  RF request for levothyroxine LOV: 11/05/14 Next ov: None Last written: 05/19/14 #30 w/ 3RF  03/22/15 TSH 16.539 - this was done while pt was in hospital.   Please advise. Thanks.

## 2015-04-05 ENCOUNTER — Telehealth: Payer: Self-pay | Admitting: *Deleted

## 2015-04-05 ENCOUNTER — Ambulatory Visit: Payer: Self-pay

## 2015-04-05 ENCOUNTER — Other Ambulatory Visit: Payer: Self-pay

## 2015-04-05 NOTE — Telephone Encounter (Signed)
This RN spoke with Steffanie Dunn - pt's daughter- who is calling regarding the appointment on 04/07/2015.  Per call, Steffanie Dunn states she does not feel mother is able to come for scheduled appointment. Steffanie Dunn reports mother is very weak " can barely sit up in the bed ".  " mom has mentioned in the past not wanting to proceed with further treatment "  This RN inquired about mental status of patient. Steffanie Dunn states " mom can talk but she doesn't seem to be able to comprehend very well." " I can ask her to repeat something and she just looks at me "  This RN inquired with Steffanie Dunn if pt has completed a living will or named someone as HCPOA. Kristi states  " no " " but would that be my dad then "  This RN validated daughter's inquiry and concerns per above.  This note will be given to MD for review and appropriate recommendations.  Return call number given as [phone].

## 2015-04-07 ENCOUNTER — Ambulatory Visit: Payer: Self-pay

## 2015-04-07 ENCOUNTER — Other Ambulatory Visit: Payer: Self-pay

## 2015-04-07 ENCOUNTER — Ambulatory Visit: Payer: Self-pay | Admitting: Nurse Practitioner

## 2015-04-08 ENCOUNTER — Telehealth: Payer: Self-pay

## 2015-04-08 NOTE — Telephone Encounter (Signed)
Referral made to [facility].  Last office note and demographics faxed.  POC info for daughter [name] provided.

## 2015-04-12 ENCOUNTER — Telehealth: Payer: Self-pay | Admitting: Surgery

## 2015-04-12 NOTE — Telephone Encounter (Signed)
Pt's son, Brittney Cunningham,  called to inform our office of the passing of his mother on May 10, 2015. She has an appointment on May 11 2015 with [facility] (Dr. Trula Slade), that will need to be cancel. Thank you

## 2015-04-21 ENCOUNTER — Ambulatory Visit: Payer: Self-pay | Admitting: Oncology

## 2015-04-21 ENCOUNTER — Other Ambulatory Visit: Payer: Self-pay

## 2015-04-21 ENCOUNTER — Telehealth: Payer: Self-pay | Admitting: *Deleted

## 2015-04-21 NOTE — Telephone Encounter (Signed)
FYI

## 2015-04-21 NOTE — Telephone Encounter (Signed)
-----   Message from Nickola Major sent at 04/21/2015  1:28 PM EDT ----- I just spoke with patient's daughter-in-law. Patient passed away earlier this month.

## 2015-04-21 NOTE — Telephone Encounter (Signed)
Noted  

## 2015-05-01 DEATH — deceased

## 2015-05-03 ENCOUNTER — Ambulatory Visit: Payer: Self-pay | Admitting: Family

## 2015-05-03 ENCOUNTER — Encounter (HOSPITAL_COMMUNITY): Payer: Self-pay

## 2015-05-11 ENCOUNTER — Ambulatory Visit: Payer: Self-pay | Admitting: Family

## 2015-05-11 ENCOUNTER — Encounter (HOSPITAL_COMMUNITY): Payer: Self-pay

## 2015-07-16 ENCOUNTER — Other Ambulatory Visit: Payer: Self-pay | Admitting: Nurse Practitioner

## 2017-02-01 IMAGING — CT CT CHEST W/ CM
2 of 3 series · 15 of 36 positions shown, 18 images · IV contrast (OMNIPAQUE)
Comparison: Bone scan 06/05/2014, CT 07/05/2013

CLINICAL DATA: Breast cancer with right mastectomy. Radiation
therapy complete. Bone metastasis. Subsequent treatment evaluation.

EXAM:
CT CHEST WITH CONTRAST
TECHNIQUE: Multidetector CT imaging of the chest was performed during
intravenous contrast administration.
CONTRAST:  80mL OMNIPAQUE IOHEXOL 300 MG/ML  SOLN

[Series 2: chest with st · axial · 0.74mm/px · z∈[-299,-19]mm · 12 of 66 slices shown, 15 images]
[im 5/66  mediastinal]
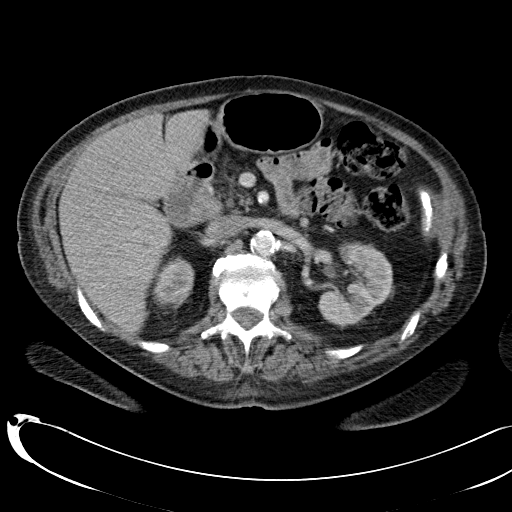
[im 5/66  lung]
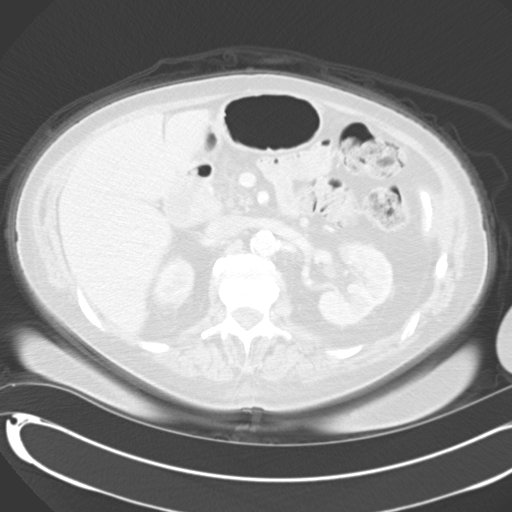
[im 10/66  lung]
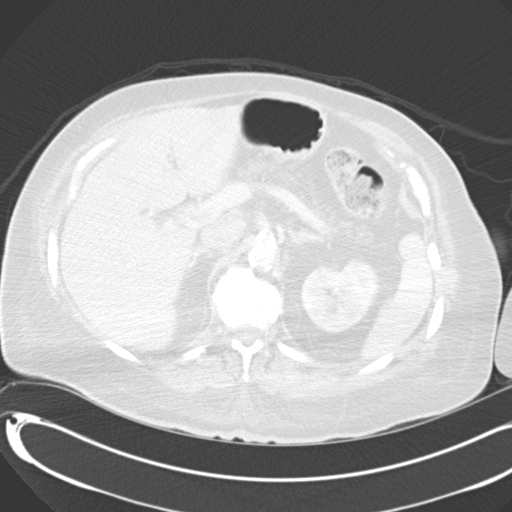
[im 15/66  lung]
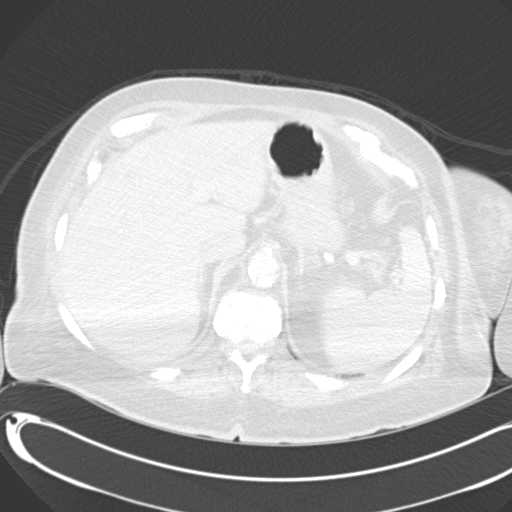
[im 20/66  lung]
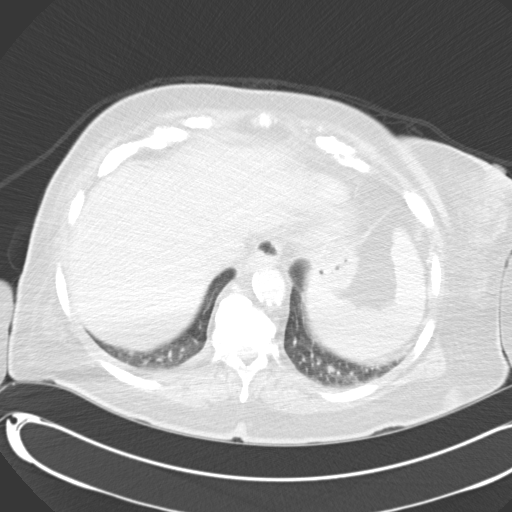
[im 25/66  mediastinal]
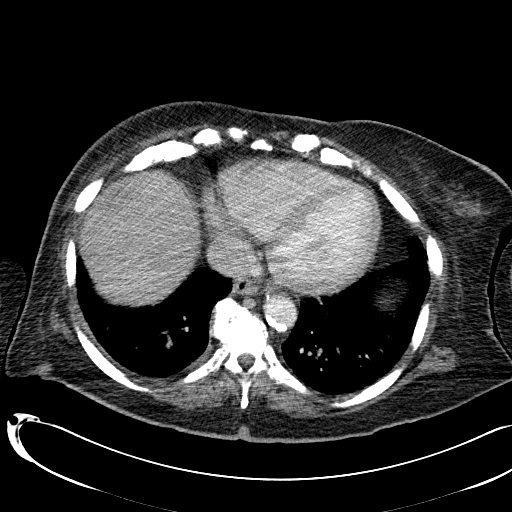
[im 25/66  lung]
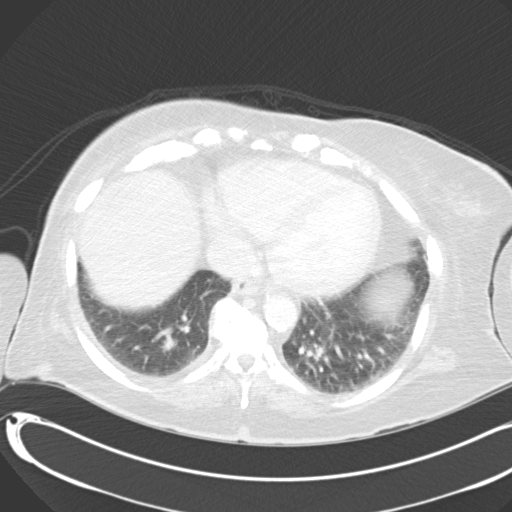
[im 29/66  lung]
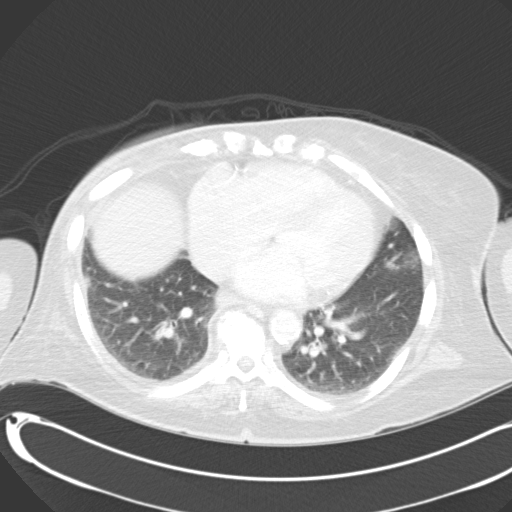
[im 37/66  lung]
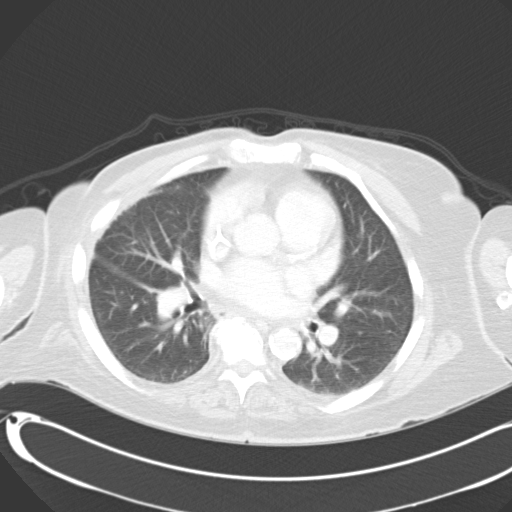
[im 41/66  lung]
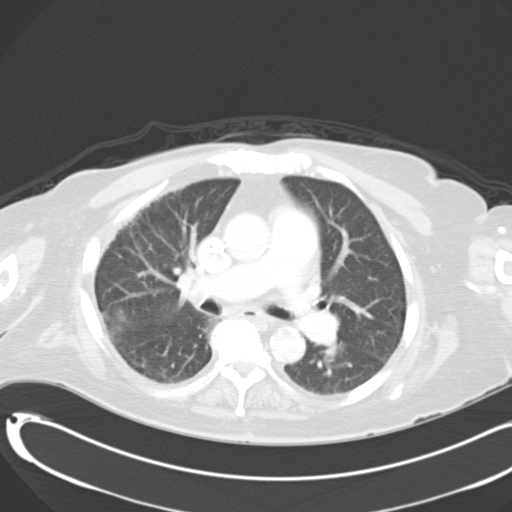
[im 46/66  mediastinal]
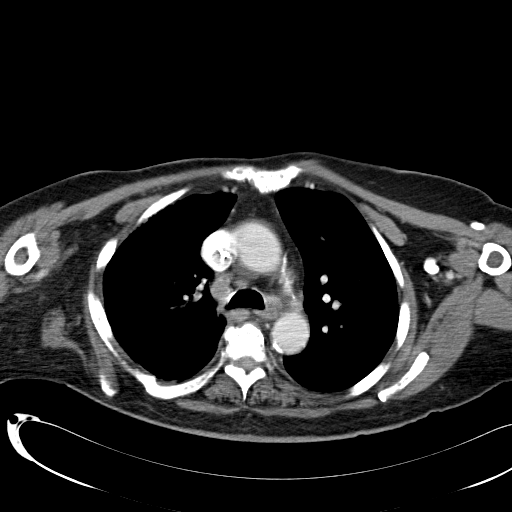
[im 46/66  lung]
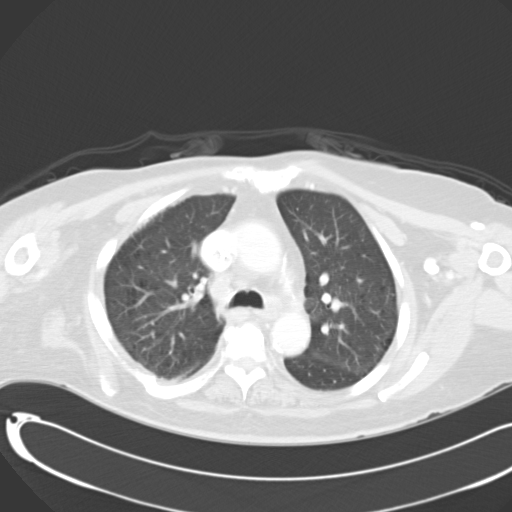
[im 51/66  lung]
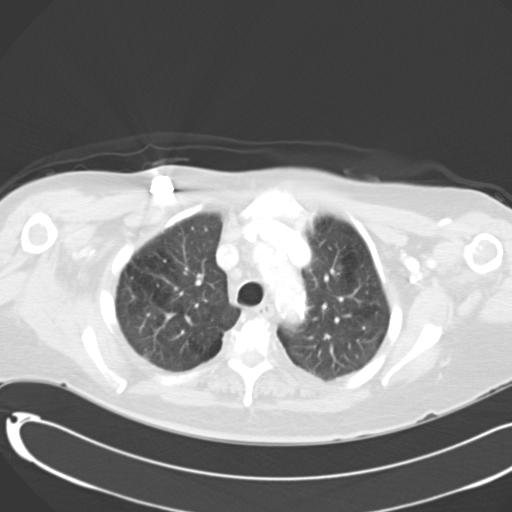
[im 56/66  lung]
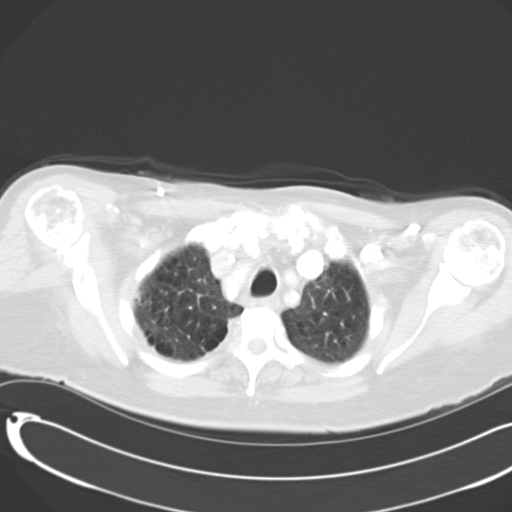
[im 61/66  lung]
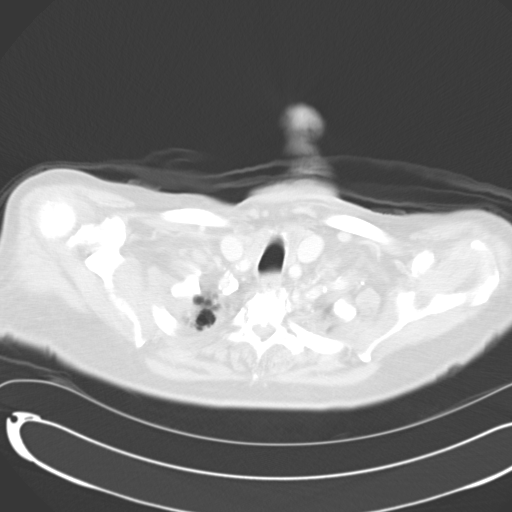

[Series 602: <mpr thick range> · coronal · 0.74mm/px · 3 of 85 slices shown]
[im 17/85  lung]
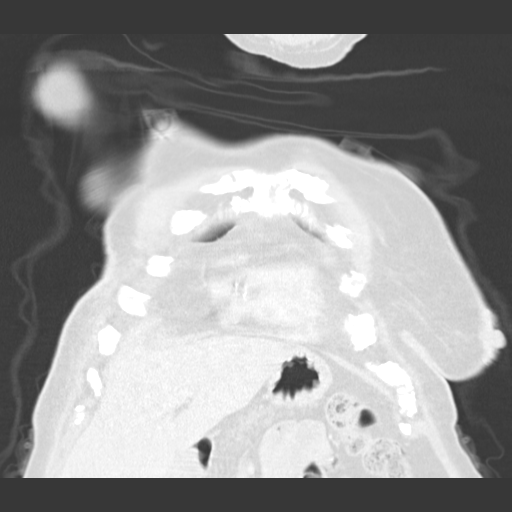
[im 34/85  lung]
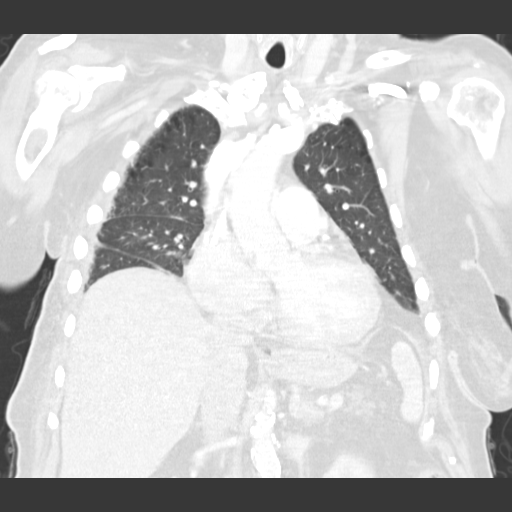
[im 51/85  lung]
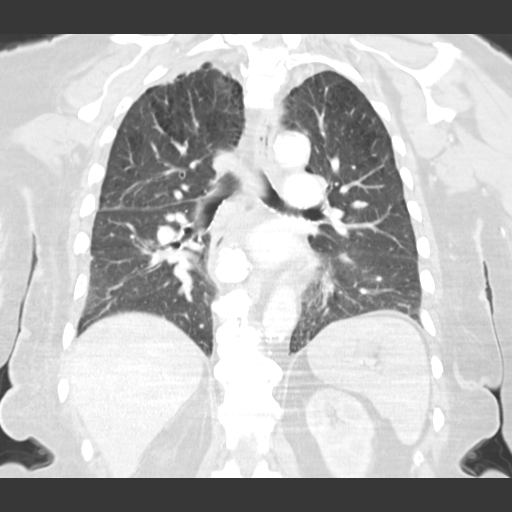

[15 of 36 positions shown; findings below may reference images not displayed]

FINDINGS: Mediastinum/Nodes: There is a port in the right chest wall. No
axillary supraclavicular lymphadenopathy. No mediastinal hilar
lymphadenopathy. No pericardial fluid.

Lungs/Pleura: 5 mm nodule at the right lung base on image 39, series
5 is new from CT 07/05/2013. There is mild pleural thickening within
the right hemi thorax not changed from prior

Upper abdomen: Limited view of the liver, kidneys, pancreas are
unremarkable. Normal adrenal glands.

Musculoskeletal: There is diffuse sclerotic metastasis within the
spine ribs and shoulder girdles.
IMPRESSION: 1. New noncalcified pulmonary nodule at the right lung base. This
lesion is too small for accurate characterization by FDG PET
imaging. Recommend short interval CT follow-up (1-3 months).
2. Stable diffuse skeletal sclerotic metastasis

## 2017-02-13 IMAGING — DX DG FOOT COMPLETE 3+V*L*
3 series · 3 of 3 positions shown · non-contrast
Comparison: None.

CLINICAL DATA: Dropped addition on the top of the foot 1 week ago.
Pain and bruising of the metatarsal region.

EXAM:
LEFT FOOT - COMPLETE 3+ VIEW

[foot ap]
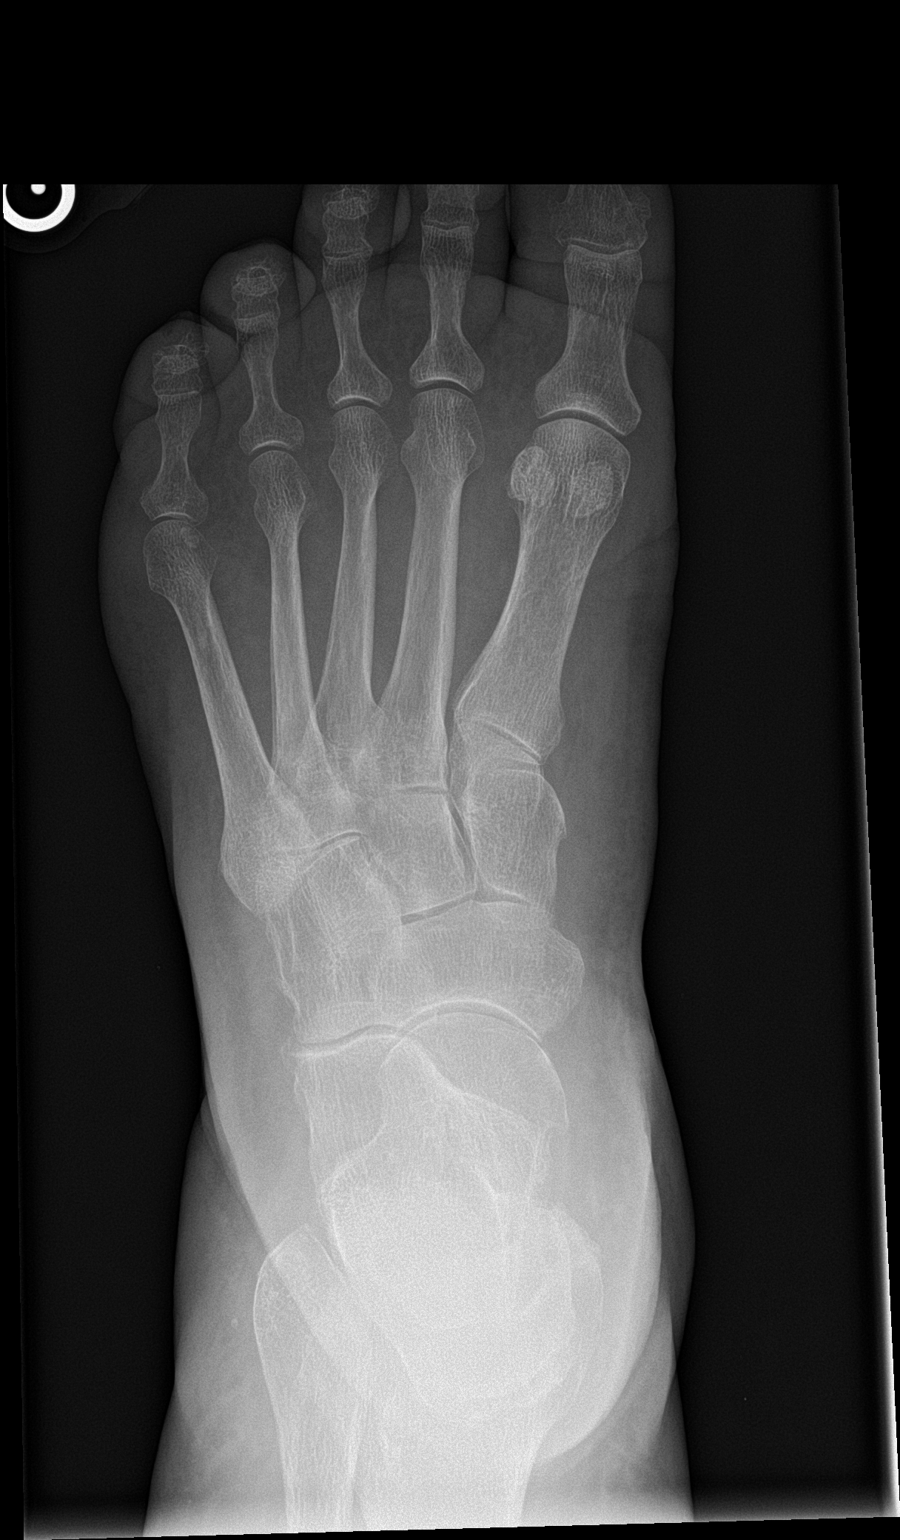

[foot obl]
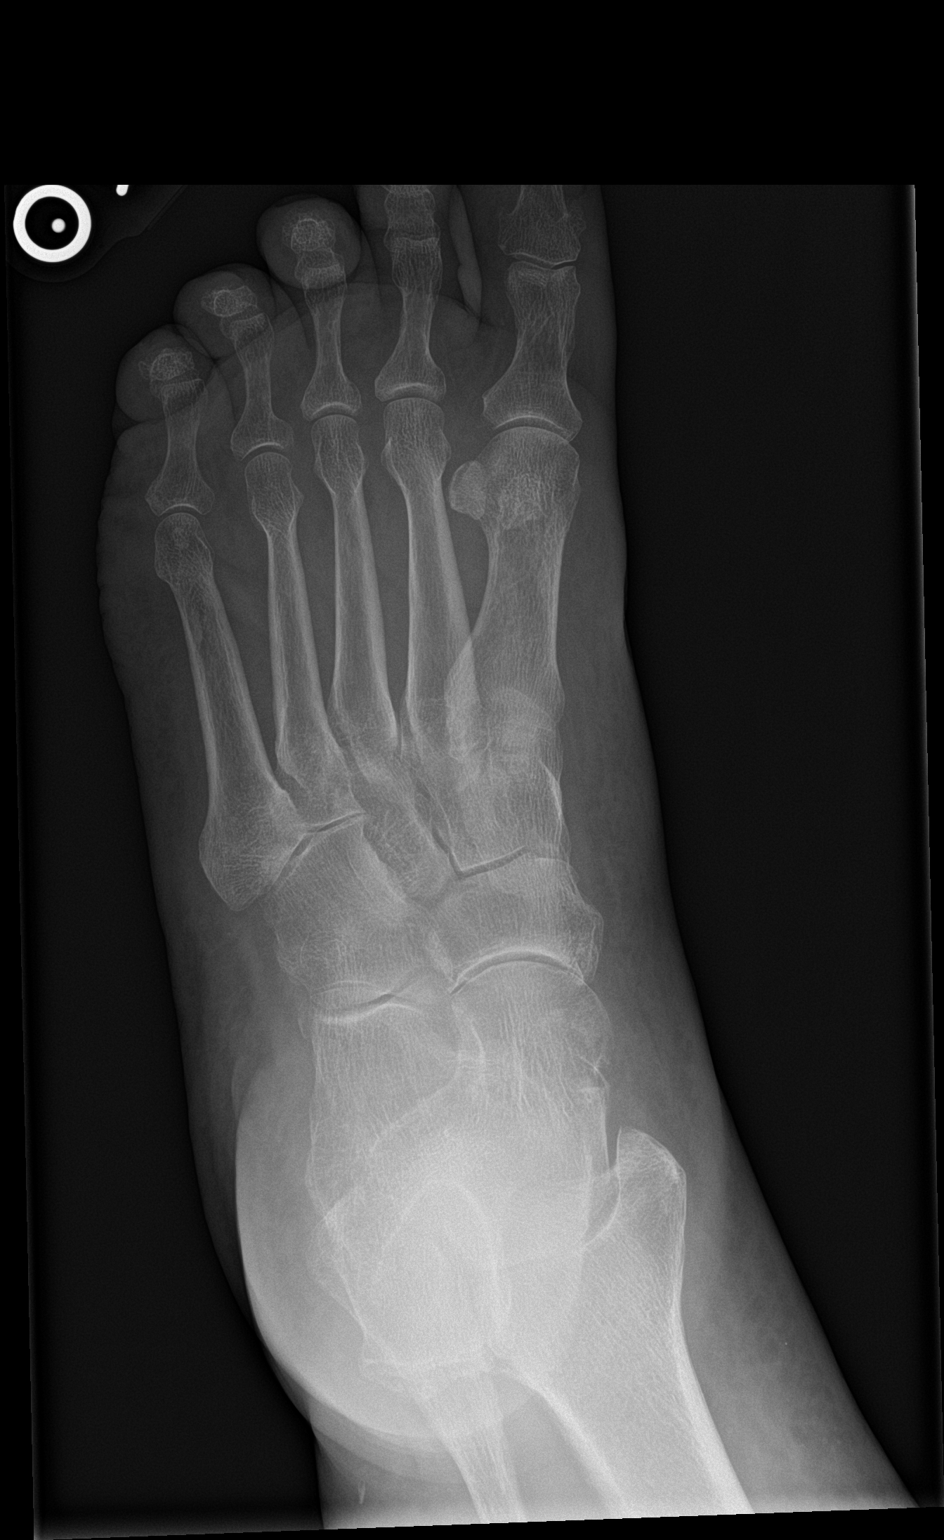

[foot lat]
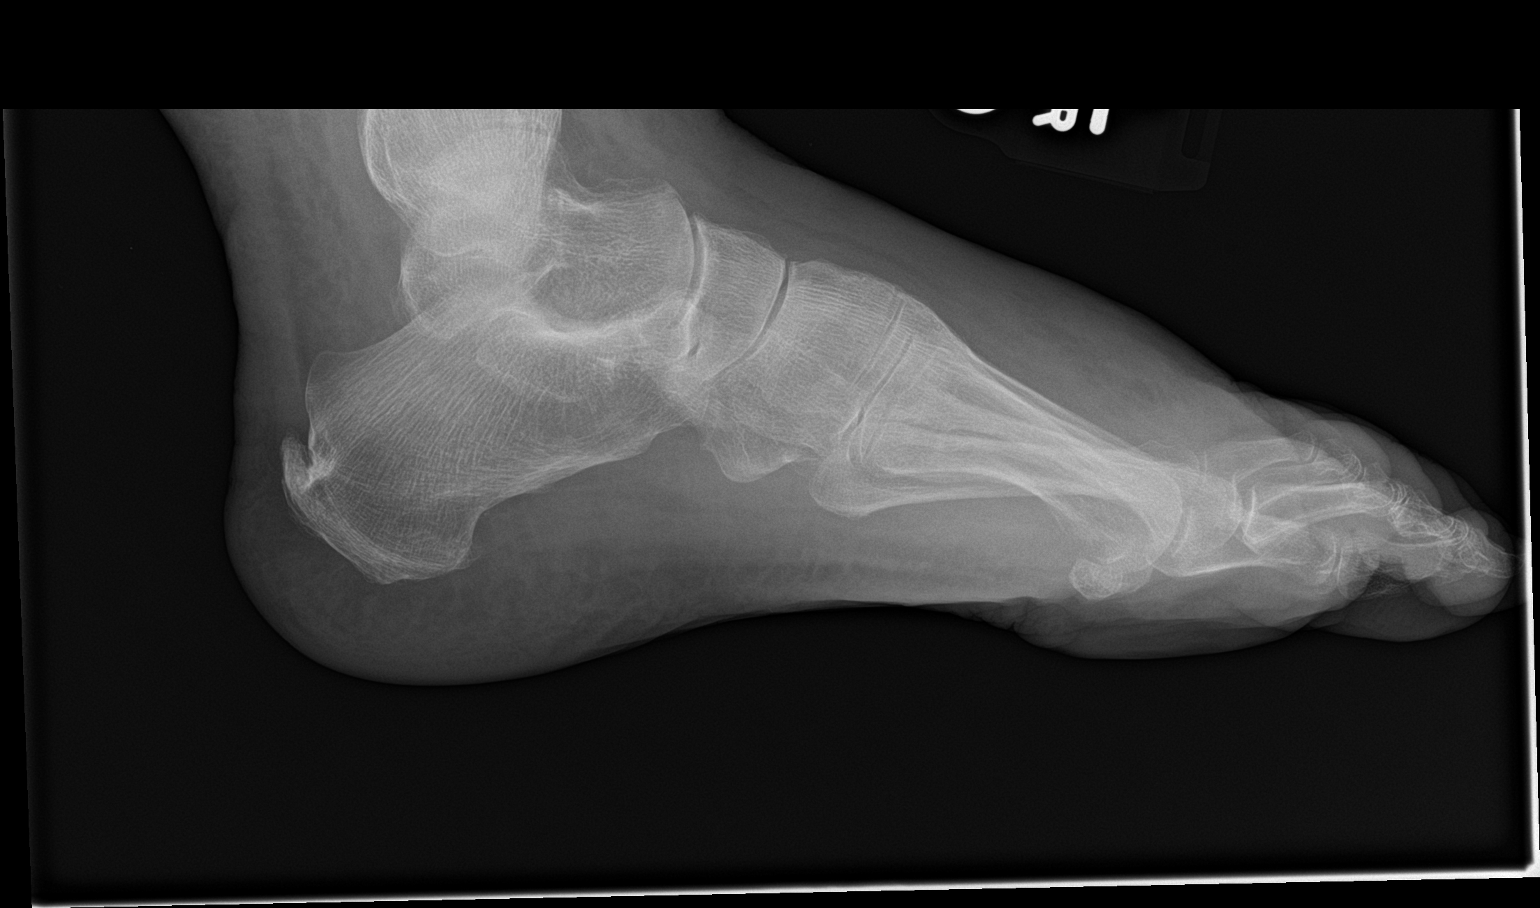

[3 of 3 positions shown; findings below may reference images not displayed]

FINDINGS: There is dorsal soft tissue swelling but no evidence of underlying
fracture. No significant degenerative changes.
IMPRESSION: Dorsal soft tissue swelling.  No acute bony finding.

## 2017-08-25 IMAGING — US US BIOPSY
1 series · 13 of 14 positions shown · non-contrast
Comparison: none

ADDENDUM:
The patient's level of consciousness and physiologic status were
continuously monitored during the procedure by Radiology nursing.
CLINICAL DATA: History of breast carcinoma metastatic to bone.
Imaging has revealed multiple new liver lesions and the patient
presents for liver biopsy.

[Series 1: us biopsy · 0.17mm/px · 13 of 14 slices shown]
[im 1/14]
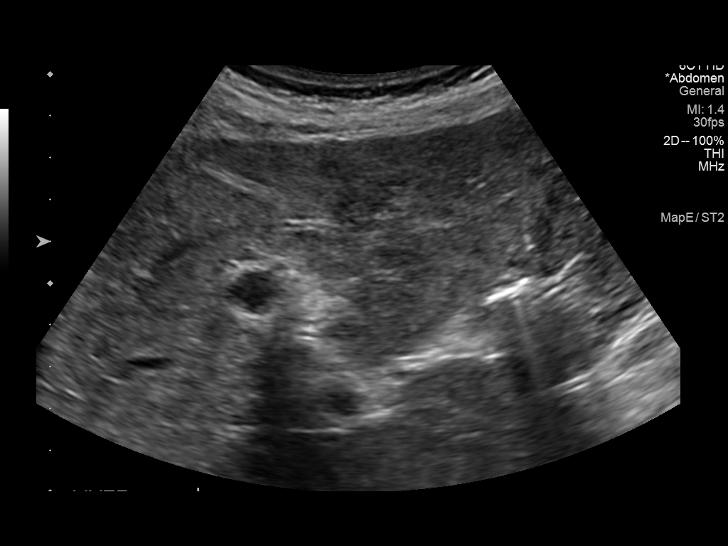
[im 2/14]
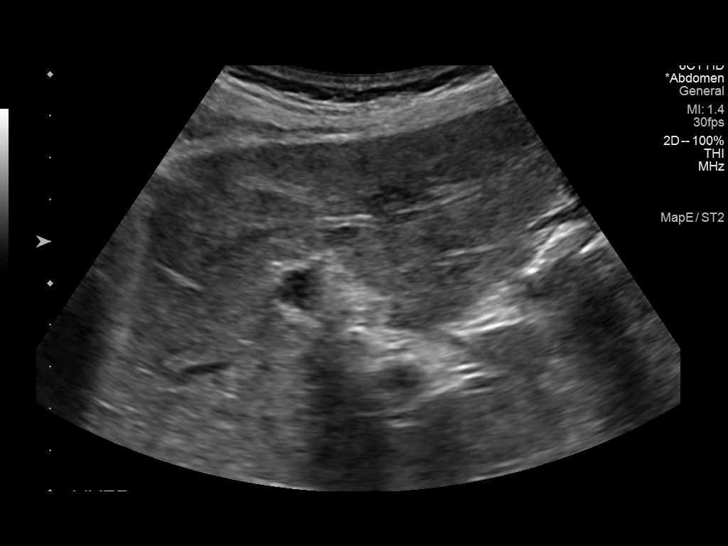
[im 3/14]
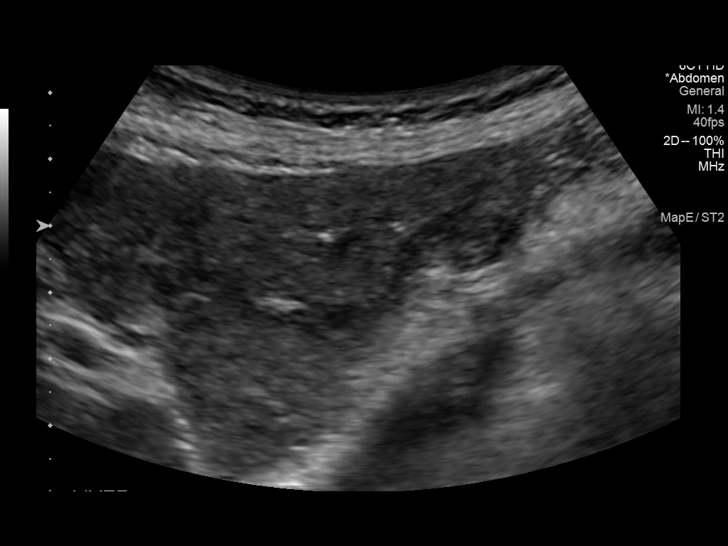
[im 4/14]
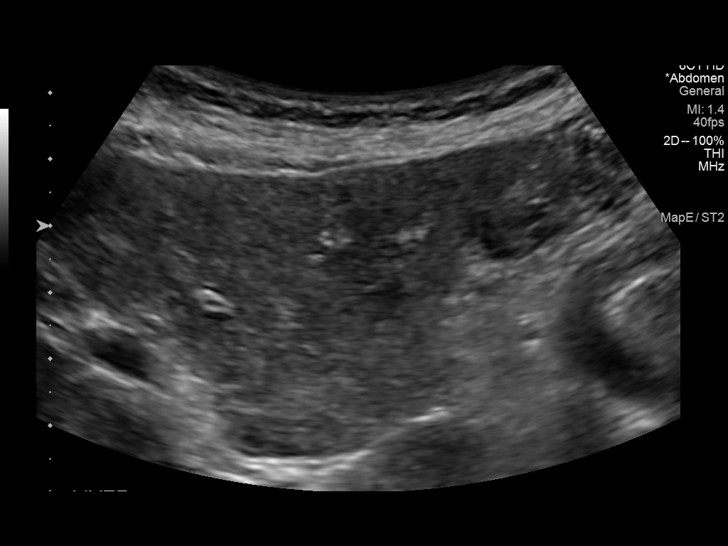
[im 5/14]
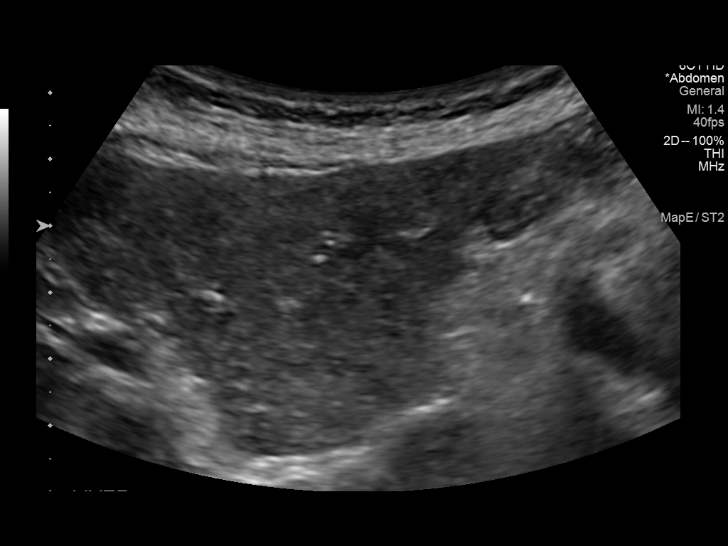
[im 6/14]
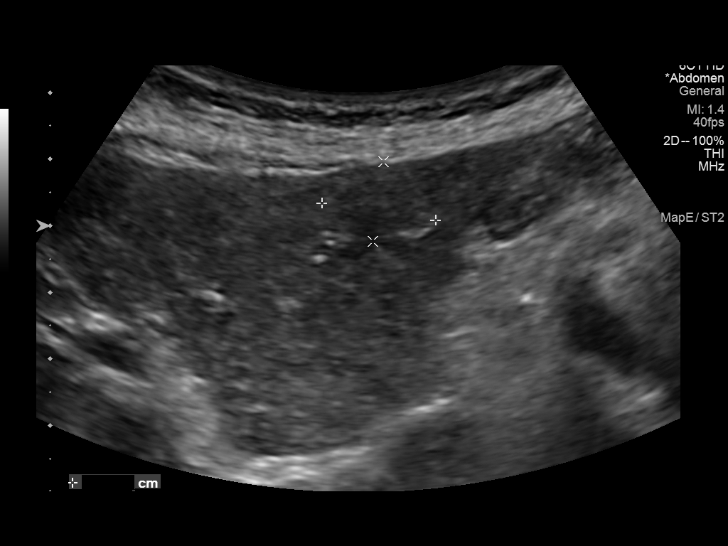
[im 8/14]
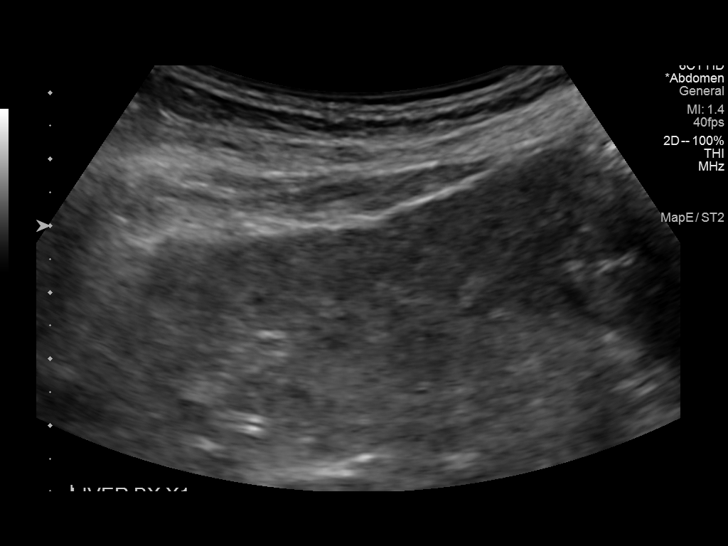
[im 9/14]
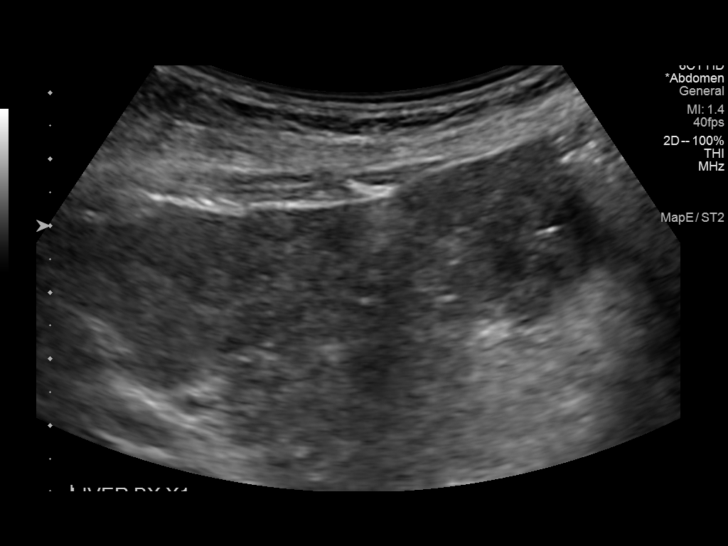
[im 10/14]
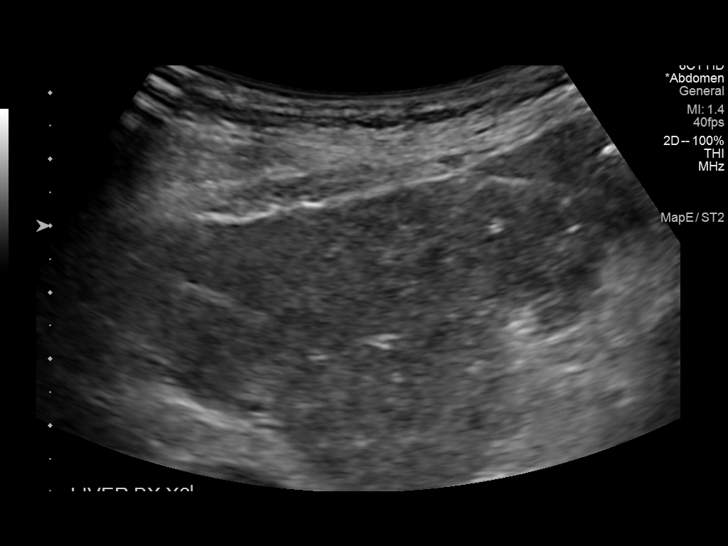
[im 11/14]
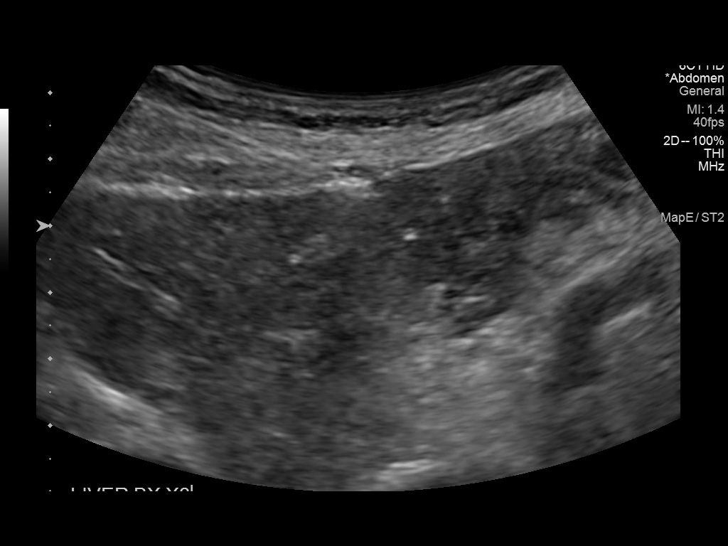
[im 12/14]
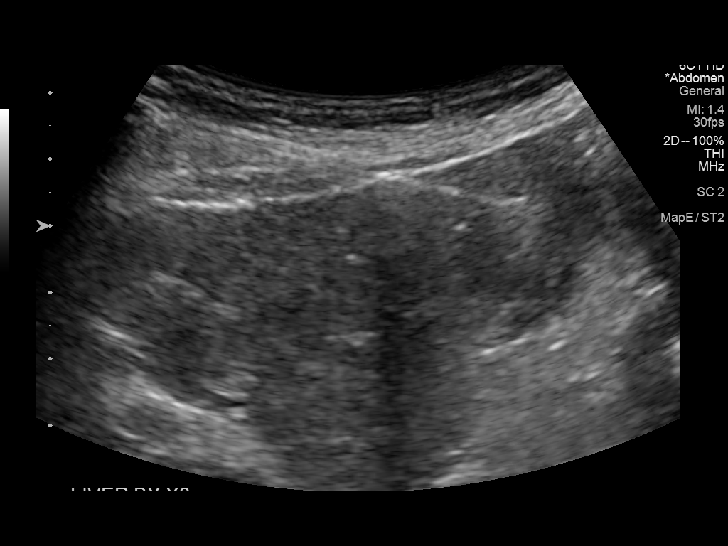
[im 13/14]
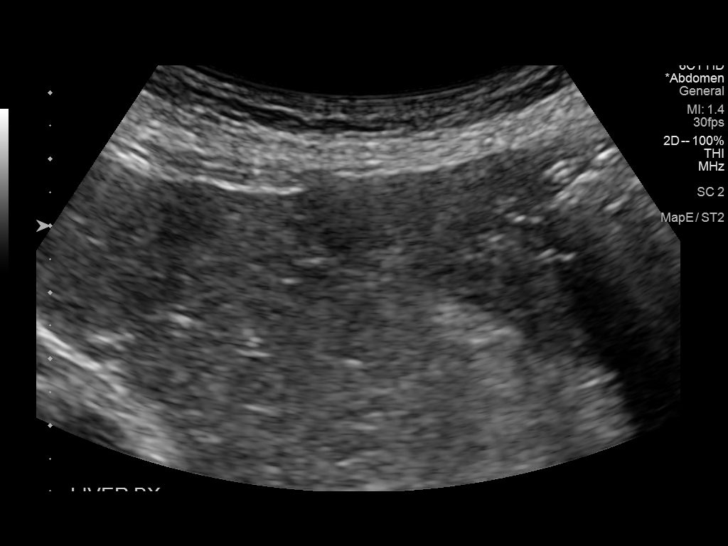
[im 14/14]
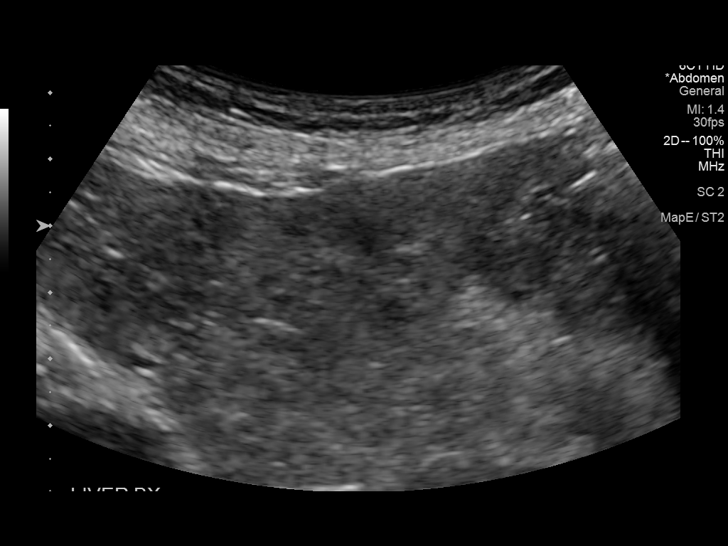

[13 of 14 positions shown; findings below may reference images not displayed]

EXAM:
ULTRASOUND GUIDED CORE BIOPSY OF LIVER

MEDICATIONS:
1.0 mg IV Versed; 50 mcg IV Fentanyl

Total Moderate Sedation Time: 14 minutes.

PROCEDURE:
The procedure, risks, benefits, and alternatives were explained to
the patient. Questions regarding the procedure were encouraged and
answered. The patient understands and consents to the procedure.

The epigastric region was prepped with Betadine in a sterile
fashion, and a sterile drape was applied covering the operative
field. A sterile gown and sterile gloves were used for the
procedure. Local anesthesia was provided with 1% Lidocaine.

Ultrasound was used to localize liver lesions. Under direct
ultrasound guidance, a 17 gauge needle was advanced into the left
lobe of the liver. Three separate core biopsy samples were obtained
and submitted in formalin. The outer needle was removed and
additional ultrasound performed.

COMPLICATIONS:
None.
FINDINGS: Multiple small lesions are identified throughout the liver which are
predominantly mildly hypoechoic relative to adjacent liver
parenchyma. A lesion in the lateral segment of the left lobe of the
liver was chosen for sampling and measures approximately 1.2 x
cm. Solid tissue was obtained.
IMPRESSION: Ultrasound-guided core biopsy performed of one of several lesions in
the liver. A lesion measuring 1.7 cm in greatest diameter within the
left lobe of the liver was sampled. Breast prognostic studies were
requested.
